# Patient Record
Sex: Male | Born: 1945
Health system: Southern US, Community
[De-identification: ages and names within clinical notes are randomized; demographics above are authoritative.]

## PROBLEM LIST (undated history)

## (undated) DIAGNOSIS — I10 Essential (primary) hypertension: Secondary | ICD-10-CM

## (undated) DIAGNOSIS — M109 Gout, unspecified: Secondary | ICD-10-CM

## (undated) DIAGNOSIS — L0390 Cellulitis, unspecified: Secondary | ICD-10-CM

## (undated) DIAGNOSIS — Z5189 Encounter for other specified aftercare: Secondary | ICD-10-CM

## (undated) DIAGNOSIS — F419 Anxiety disorder, unspecified: Secondary | ICD-10-CM

## (undated) DIAGNOSIS — C801 Malignant (primary) neoplasm, unspecified: Secondary | ICD-10-CM

## (undated) DIAGNOSIS — W3400XA Accidental discharge from unspecified firearms or gun, initial encounter: Secondary | ICD-10-CM

## (undated) DIAGNOSIS — D759 Disease of blood and blood-forming organs, unspecified: Secondary | ICD-10-CM

## (undated) DIAGNOSIS — E860 Dehydration: Secondary | ICD-10-CM

## (undated) DIAGNOSIS — I219 Acute myocardial infarction, unspecified: Secondary | ICD-10-CM

## (undated) DIAGNOSIS — M1990 Unspecified osteoarthritis, unspecified site: Secondary | ICD-10-CM

## (undated) DIAGNOSIS — K861 Other chronic pancreatitis: Secondary | ICD-10-CM

## (undated) DIAGNOSIS — J449 Chronic obstructive pulmonary disease, unspecified: Secondary | ICD-10-CM

## (undated) DIAGNOSIS — K859 Acute pancreatitis without necrosis or infection, unspecified: Secondary | ICD-10-CM

## (undated) DIAGNOSIS — J9600 Acute respiratory failure, unspecified whether with hypoxia or hypercapnia: Secondary | ICD-10-CM

## (undated) DIAGNOSIS — S71139A Puncture wound without foreign body, unspecified thigh, initial encounter: Secondary | ICD-10-CM

---

## 1966-05-09 HISTORY — PX: LEG AMPUTATION: SHX1105

## 1966-05-09 HISTORY — PX: OTHER SURGICAL HISTORY: SHX169

## 1998-01-23 ENCOUNTER — Emergency Department (HOSPITAL_COMMUNITY): Admission: EM | Admit: 1998-01-23 | Discharge: 1998-01-23 | Payer: Self-pay | Admitting: Emergency Medicine

## 1998-03-31 ENCOUNTER — Encounter: Payer: Self-pay | Admitting: Emergency Medicine

## 1998-03-31 ENCOUNTER — Inpatient Hospital Stay (HOSPITAL_COMMUNITY): Admission: EM | Admit: 1998-03-31 | Discharge: 1998-04-03 | Payer: Self-pay | Admitting: Emergency Medicine

## 1998-04-28 ENCOUNTER — Encounter (HOSPITAL_COMMUNITY): Admission: RE | Admit: 1998-04-28 | Discharge: 1998-07-27 | Payer: Self-pay | Admitting: *Deleted

## 1998-08-25 ENCOUNTER — Emergency Department (HOSPITAL_COMMUNITY): Admission: EM | Admit: 1998-08-25 | Discharge: 1998-08-25 | Payer: Self-pay | Admitting: Emergency Medicine

## 1998-08-25 ENCOUNTER — Encounter: Payer: Self-pay | Admitting: Emergency Medicine

## 1998-12-25 ENCOUNTER — Encounter: Payer: Self-pay | Admitting: *Deleted

## 1998-12-25 ENCOUNTER — Inpatient Hospital Stay (HOSPITAL_COMMUNITY): Admission: AD | Admit: 1998-12-25 | Discharge: 1998-12-26 | Payer: Self-pay | Admitting: *Deleted

## 1998-12-26 ENCOUNTER — Encounter: Payer: Self-pay | Admitting: *Deleted

## 1999-02-17 ENCOUNTER — Emergency Department (HOSPITAL_COMMUNITY): Admission: EM | Admit: 1999-02-17 | Discharge: 1999-02-17 | Payer: Self-pay | Admitting: Emergency Medicine

## 1999-03-17 ENCOUNTER — Emergency Department (HOSPITAL_COMMUNITY): Admission: EM | Admit: 1999-03-17 | Discharge: 1999-03-17 | Payer: Self-pay | Admitting: Internal Medicine

## 1999-03-17 ENCOUNTER — Encounter: Payer: Self-pay | Admitting: Internal Medicine

## 1999-08-02 ENCOUNTER — Encounter: Payer: Self-pay | Admitting: Emergency Medicine

## 1999-08-02 ENCOUNTER — Emergency Department (HOSPITAL_COMMUNITY): Admission: EM | Admit: 1999-08-02 | Discharge: 1999-08-02 | Payer: Self-pay | Admitting: Emergency Medicine

## 1999-08-22 ENCOUNTER — Emergency Department (HOSPITAL_COMMUNITY): Admission: EM | Admit: 1999-08-22 | Discharge: 1999-08-22 | Payer: Self-pay | Admitting: Emergency Medicine

## 1999-09-21 ENCOUNTER — Encounter
Admission: RE | Admit: 1999-09-21 | Discharge: 1999-12-20 | Payer: Self-pay | Admitting: Physical Medicine and Rehabilitation

## 1999-11-24 ENCOUNTER — Encounter: Payer: Self-pay | Admitting: Emergency Medicine

## 1999-11-24 ENCOUNTER — Emergency Department (HOSPITAL_COMMUNITY): Admission: EM | Admit: 1999-11-24 | Discharge: 1999-11-24 | Payer: Self-pay | Admitting: Emergency Medicine

## 2000-03-28 ENCOUNTER — Emergency Department (HOSPITAL_COMMUNITY): Admission: EM | Admit: 2000-03-28 | Discharge: 2000-03-28 | Payer: Self-pay | Admitting: Emergency Medicine

## 2000-11-29 ENCOUNTER — Ambulatory Visit (HOSPITAL_COMMUNITY): Admission: RE | Admit: 2000-11-29 | Discharge: 2000-11-29 | Payer: Self-pay | Admitting: Family Medicine

## 2000-11-29 ENCOUNTER — Encounter: Payer: Self-pay | Admitting: Family Medicine

## 2001-02-05 ENCOUNTER — Emergency Department (HOSPITAL_COMMUNITY): Admission: EM | Admit: 2001-02-05 | Discharge: 2001-02-05 | Payer: Self-pay | Admitting: Emergency Medicine

## 2001-02-06 ENCOUNTER — Encounter: Payer: Self-pay | Admitting: Emergency Medicine

## 2001-02-06 ENCOUNTER — Inpatient Hospital Stay (HOSPITAL_COMMUNITY): Admission: EM | Admit: 2001-02-06 | Discharge: 2001-02-07 | Payer: Self-pay | Admitting: Emergency Medicine

## 2001-02-07 ENCOUNTER — Encounter: Payer: Self-pay | Admitting: Internal Medicine

## 2001-03-19 ENCOUNTER — Encounter: Admission: RE | Admit: 2001-03-19 | Discharge: 2001-03-19 | Payer: Self-pay

## 2001-04-10 ENCOUNTER — Encounter: Admission: RE | Admit: 2001-04-10 | Discharge: 2001-04-10 | Payer: Self-pay | Admitting: Internal Medicine

## 2002-07-16 ENCOUNTER — Encounter: Payer: Self-pay | Admitting: Family Medicine

## 2002-07-16 ENCOUNTER — Ambulatory Visit (HOSPITAL_COMMUNITY): Admission: RE | Admit: 2002-07-16 | Discharge: 2002-07-16 | Payer: Self-pay | Admitting: Family Medicine

## 2003-06-03 ENCOUNTER — Emergency Department (HOSPITAL_COMMUNITY): Admission: EM | Admit: 2003-06-03 | Discharge: 2003-06-04 | Payer: Self-pay | Admitting: Emergency Medicine

## 2004-02-03 ENCOUNTER — Ambulatory Visit: Payer: Self-pay | Admitting: Family Medicine

## 2004-03-01 ENCOUNTER — Ambulatory Visit (HOSPITAL_COMMUNITY): Admission: RE | Admit: 2004-03-01 | Discharge: 2004-03-01 | Payer: Self-pay | Admitting: Surgery

## 2004-03-05 ENCOUNTER — Ambulatory Visit (HOSPITAL_COMMUNITY): Admission: RE | Admit: 2004-03-05 | Discharge: 2004-03-05 | Payer: Self-pay | Admitting: Surgery

## 2004-03-11 ENCOUNTER — Ambulatory Visit (HOSPITAL_COMMUNITY): Admission: RE | Admit: 2004-03-11 | Discharge: 2004-03-12 | Payer: Self-pay | Admitting: Surgery

## 2004-09-11 ENCOUNTER — Emergency Department (HOSPITAL_COMMUNITY): Admission: EM | Admit: 2004-09-11 | Discharge: 2004-09-11 | Payer: Self-pay | Admitting: Emergency Medicine

## 2004-10-26 ENCOUNTER — Emergency Department (HOSPITAL_COMMUNITY): Admission: EM | Admit: 2004-10-26 | Discharge: 2004-10-26 | Payer: Self-pay | Admitting: Emergency Medicine

## 2005-07-21 ENCOUNTER — Emergency Department (HOSPITAL_COMMUNITY): Admission: EM | Admit: 2005-07-21 | Discharge: 2005-07-21 | Payer: Self-pay | Admitting: Emergency Medicine

## 2005-12-29 ENCOUNTER — Emergency Department (HOSPITAL_COMMUNITY): Admission: EM | Admit: 2005-12-29 | Discharge: 2005-12-29 | Payer: Self-pay | Admitting: Emergency Medicine

## 2006-09-03 ENCOUNTER — Emergency Department (HOSPITAL_COMMUNITY): Admission: EM | Admit: 2006-09-03 | Discharge: 2006-09-03 | Payer: Self-pay | Admitting: Emergency Medicine

## 2006-09-04 ENCOUNTER — Emergency Department (HOSPITAL_COMMUNITY): Admission: EM | Admit: 2006-09-04 | Discharge: 2006-09-04 | Payer: Self-pay | Admitting: *Deleted

## 2006-09-07 ENCOUNTER — Inpatient Hospital Stay (HOSPITAL_COMMUNITY): Admission: AD | Admit: 2006-09-07 | Discharge: 2006-09-15 | Payer: Self-pay | Admitting: Oral and Maxillofacial Surgery

## 2006-09-11 ENCOUNTER — Ambulatory Visit: Payer: Self-pay | Admitting: Internal Medicine

## 2007-03-20 ENCOUNTER — Ambulatory Visit: Payer: Self-pay | Admitting: Vascular Surgery

## 2007-05-25 ENCOUNTER — Ambulatory Visit (HOSPITAL_BASED_OUTPATIENT_CLINIC_OR_DEPARTMENT_OTHER): Admission: RE | Admit: 2007-05-25 | Discharge: 2007-05-25 | Payer: Self-pay | Admitting: Orthopaedic Surgery

## 2007-05-31 ENCOUNTER — Ambulatory Visit: Payer: Self-pay | Admitting: Oncology

## 2007-06-01 ENCOUNTER — Inpatient Hospital Stay (HOSPITAL_COMMUNITY): Admission: EM | Admit: 2007-06-01 | Discharge: 2007-06-06 | Payer: Self-pay | Admitting: Emergency Medicine

## 2007-06-08 ENCOUNTER — Ambulatory Visit: Payer: Self-pay | Admitting: Oncology

## 2007-06-11 LAB — MANUAL DIFFERENTIAL
ALC: 6.6 10*3/uL — ABNORMAL HIGH (ref 0.9–3.3)
ANC (CHCC manual diff): 203.7 10*3/uL — ABNORMAL HIGH (ref 1.5–6.5)
Blasts: 1 % — ABNORMAL HIGH (ref 0–0)
LYMPH: 3 % — ABNORMAL LOW (ref 14–49)
MONO: 1 % (ref 0–14)
Metamyelocytes: 15 % — ABNORMAL HIGH (ref 0–0)
Myelocytes: 19 % — ABNORMAL HIGH (ref 0–0)
PLT EST: ADEQUATE
SEG: 40 % (ref 38–77)
Variant Lymph: 0 % (ref 0–0)
nRBC: 5 % — ABNORMAL HIGH (ref 0–0)

## 2007-06-11 LAB — COMPREHENSIVE METABOLIC PANEL
ALT: 29 U/L (ref 0–53)
AST: 27 U/L (ref 0–37)
Alkaline Phosphatase: 117 U/L (ref 39–117)
Calcium: 9.6 mg/dL (ref 8.4–10.5)
Chloride: 103 mEq/L (ref 96–112)
Creatinine, Ser: 1.14 mg/dL (ref 0.40–1.50)
Total Bilirubin: 0.5 mg/dL (ref 0.3–1.2)

## 2007-06-11 LAB — CBC WITH DIFFERENTIAL/PLATELET
HCT: 27.6 % — ABNORMAL LOW (ref 38.7–49.9)
MCHC: 29.7 g/dL — ABNORMAL LOW (ref 32.0–35.9)
Platelets: 330 10*3/uL (ref 145–400)
WBC: 219 10*3/uL (ref 4.0–10.0)

## 2007-06-15 LAB — BASIC METABOLIC PANEL
CO2: 26 mEq/L (ref 19–32)
Calcium: 9 mg/dL (ref 8.4–10.5)
Chloride: 107 mEq/L (ref 96–112)
Creatinine, Ser: 1.21 mg/dL (ref 0.40–1.50)
Glucose, Bld: 112 mg/dL — ABNORMAL HIGH (ref 70–99)

## 2007-06-15 LAB — CBC WITH DIFFERENTIAL/PLATELET
Basophils Absolute: 0 10*3/uL (ref 0.0–0.1)
EOS%: 0.8 % (ref 0.0–7.0)
HCT: 29.4 % — ABNORMAL LOW (ref 38.7–49.9)
HGB: 9.4 g/dL — ABNORMAL LOW (ref 13.0–17.1)
LYMPH%: 4 % — ABNORMAL LOW (ref 14.0–48.0)
MCH: 26.2 pg — ABNORMAL LOW (ref 28.0–33.4)
MCV: 82 fL (ref 81.6–98.0)
MONO%: 0.7 % (ref 0.0–13.0)
NEUT%: 94.5 % — ABNORMAL HIGH (ref 40.0–75.0)
Platelets: 276 10*3/uL (ref 145–400)

## 2007-06-21 LAB — CBC WITH DIFFERENTIAL/PLATELET
BASO%: 0.5 % (ref 0.0–2.0)
EOS%: 1.8 % (ref 0.0–7.0)
MCH: 26.7 pg — ABNORMAL LOW (ref 28.0–33.4)
MCHC: 32.8 g/dL (ref 32.0–35.9)
MCV: 81.4 fL — ABNORMAL LOW (ref 81.6–98.0)
MONO%: 1.6 % (ref 0.0–13.0)
NEUT%: 91.5 % — ABNORMAL HIGH (ref 40.0–75.0)
RDW: 18.8 % — ABNORMAL HIGH (ref 11.2–14.6)
lymph#: 2 10*3/uL (ref 0.9–3.3)

## 2007-07-03 LAB — CBC WITH DIFFERENTIAL/PLATELET
BASO%: 0.5 % (ref 0.0–2.0)
EOS%: 8.5 % — ABNORMAL HIGH (ref 0.0–7.0)
Eosinophils Absolute: 0.3 10*3/uL (ref 0.0–0.5)
LYMPH%: 10.7 % — ABNORMAL LOW (ref 14.0–48.0)
MCHC: 32.5 g/dL (ref 32.0–35.9)
MCV: 82.6 fL (ref 81.6–98.0)
MONO%: 6.9 % (ref 0.0–13.0)
NEUT#: 2.7 10*3/uL (ref 1.5–6.5)
Platelets: 136 10*3/uL — ABNORMAL LOW (ref 145–400)
RBC: 3.29 10*6/uL — ABNORMAL LOW (ref 4.20–5.71)
RDW: 18.9 % — ABNORMAL HIGH (ref 11.2–14.6)

## 2007-07-12 LAB — CBC WITH DIFFERENTIAL/PLATELET
BASO%: 0.7 % (ref 0.0–2.0)
EOS%: 9.1 % — ABNORMAL HIGH (ref 0.0–7.0)
HCT: 28.5 % — ABNORMAL LOW (ref 38.7–49.9)
MCH: 26.5 pg — ABNORMAL LOW (ref 28.0–33.4)
MCHC: 33.1 g/dL (ref 32.0–35.9)
MONO#: 0.1 10*3/uL (ref 0.1–0.9)
RBC: 3.55 10*6/uL — ABNORMAL LOW (ref 4.20–5.71)
RDW: 18.1 % — ABNORMAL HIGH (ref 11.2–14.6)
WBC: 2.1 10*3/uL — ABNORMAL LOW (ref 4.0–10.0)
lymph#: 0.5 10*3/uL — ABNORMAL LOW (ref 0.9–3.3)

## 2007-07-12 LAB — FERRITIN: Ferritin: 93 ng/mL (ref 22–322)

## 2007-07-17 ENCOUNTER — Ambulatory Visit: Payer: Self-pay | Admitting: Oncology

## 2007-07-19 LAB — CBC WITH DIFFERENTIAL/PLATELET
BASO%: 1.7 % (ref 0.0–2.0)
Eosinophils Absolute: 0.2 10*3/uL (ref 0.0–0.5)
LYMPH%: 22.2 % (ref 14.0–48.0)
MCHC: 33 g/dL (ref 32.0–35.9)
MONO#: 0.2 10*3/uL (ref 0.1–0.9)
NEUT#: 1.3 10*3/uL — ABNORMAL LOW (ref 1.5–6.5)
Platelets: 192 10*3/uL (ref 145–400)
RBC: 3.82 10*6/uL — ABNORMAL LOW (ref 4.20–5.71)
RDW: 17.8 % — ABNORMAL HIGH (ref 11.2–14.6)
WBC: 2.2 10*3/uL — ABNORMAL LOW (ref 4.0–10.0)
lymph#: 0.5 10*3/uL — ABNORMAL LOW (ref 0.9–3.3)

## 2007-07-20 LAB — BCR/ABL

## 2007-07-26 LAB — CBC WITH DIFFERENTIAL/PLATELET
BASO%: 0 % (ref 0.0–2.0)
Eosinophils Absolute: 0 10*3/uL (ref 0.0–0.5)
HCT: 30.9 % — ABNORMAL LOW (ref 38.7–49.9)
MCHC: 32.8 g/dL (ref 32.0–35.9)
MONO#: 0.2 10*3/uL (ref 0.1–0.9)
NEUT#: 2.9 10*3/uL (ref 1.5–6.5)
NEUT%: 83.1 % — ABNORMAL HIGH (ref 40.0–75.0)
WBC: 3.4 10*3/uL — ABNORMAL LOW (ref 4.0–10.0)
lymph#: 0.4 10*3/uL — ABNORMAL LOW (ref 0.9–3.3)

## 2007-08-02 LAB — CBC WITH DIFFERENTIAL/PLATELET
Basophils Absolute: 0 10*3/uL (ref 0.0–0.1)
HCT: 33.7 % — ABNORMAL LOW (ref 38.7–49.9)
HGB: 10.6 g/dL — ABNORMAL LOW (ref 13.0–17.1)
LYMPH%: 26.6 % (ref 14.0–48.0)
MCH: 24.8 pg — ABNORMAL LOW (ref 28.0–33.4)
MONO#: 0.2 10*3/uL (ref 0.1–0.9)
NEUT%: 53.9 % (ref 40.0–75.0)
Platelets: 178 10*3/uL (ref 145–400)
WBC: 3 10*3/uL — ABNORMAL LOW (ref 4.0–10.0)
lymph#: 0.8 10*3/uL — ABNORMAL LOW (ref 0.9–3.3)

## 2007-08-16 LAB — CBC WITH DIFFERENTIAL/PLATELET
Basophils Absolute: 0 10*3/uL (ref 0.0–0.1)
EOS%: 9.5 % — ABNORMAL HIGH (ref 0.0–7.0)
HCT: 35.1 % — ABNORMAL LOW (ref 38.7–49.9)
HGB: 11.1 g/dL — ABNORMAL LOW (ref 13.0–17.1)
LYMPH%: 24.3 % (ref 14.0–48.0)
MCH: 24.6 pg — ABNORMAL LOW (ref 28.0–33.4)
MCV: 77.5 fL — ABNORMAL LOW (ref 81.6–98.0)
MONO%: 7.3 % (ref 0.0–13.0)
NEUT%: 58.5 % (ref 40.0–75.0)

## 2007-08-28 ENCOUNTER — Ambulatory Visit: Payer: Self-pay | Admitting: Oncology

## 2007-08-30 LAB — CBC WITH DIFFERENTIAL/PLATELET
Basophils Absolute: 0 10*3/uL (ref 0.0–0.1)
EOS%: 10.4 % — ABNORMAL HIGH (ref 0.0–7.0)
HGB: 11.4 g/dL — ABNORMAL LOW (ref 13.0–17.1)
MCH: 24.9 pg — ABNORMAL LOW (ref 28.0–33.4)
MCHC: 33.2 g/dL (ref 32.0–35.9)
MCV: 75 fL — ABNORMAL LOW (ref 81.6–98.0)
MONO%: 8.6 % (ref 0.0–13.0)
RBC: 4.57 10*6/uL (ref 4.20–5.71)
RDW: 17.6 % — ABNORMAL HIGH (ref 11.2–14.6)

## 2007-10-10 LAB — CBC WITH DIFFERENTIAL/PLATELET
EOS%: 6 % (ref 0.0–7.0)
Eosinophils Absolute: 0.2 10*3/uL (ref 0.0–0.5)
MCV: 74.3 fL — ABNORMAL LOW (ref 81.6–98.0)
MONO%: 7.2 % (ref 0.0–13.0)
NEUT#: 1.8 10*3/uL (ref 1.5–6.5)
RBC: 4.47 10*6/uL (ref 4.20–5.71)
RDW: 20.7 % — ABNORMAL HIGH (ref 11.2–14.6)

## 2007-10-10 LAB — COMPREHENSIVE METABOLIC PANEL
ALT: 8 U/L (ref 0–53)
AST: 14 U/L (ref 0–37)
Albumin: 4.2 g/dL (ref 3.5–5.2)
Alkaline Phosphatase: 93 U/L (ref 39–117)
Glucose, Bld: 99 mg/dL (ref 70–99)
Potassium: 2.9 mEq/L — ABNORMAL LOW (ref 3.5–5.3)
Sodium: 143 mEq/L (ref 135–145)
Total Protein: 6.8 g/dL (ref 6.0–8.3)

## 2007-10-15 ENCOUNTER — Ambulatory Visit: Payer: Self-pay | Admitting: Oncology

## 2007-10-15 LAB — BASIC METABOLIC PANEL
BUN: 16 mg/dL (ref 6–23)
CO2: 24 mEq/L (ref 19–32)
Calcium: 9.5 mg/dL (ref 8.4–10.5)
Creatinine, Ser: 1.36 mg/dL (ref 0.40–1.50)
Glucose, Bld: 115 mg/dL — ABNORMAL HIGH (ref 70–99)

## 2007-10-24 LAB — CBC WITH DIFFERENTIAL/PLATELET
BASO%: 0.4 % (ref 0.0–2.0)
EOS%: 8.6 % — ABNORMAL HIGH (ref 0.0–7.0)
MCH: 24.7 pg — ABNORMAL LOW (ref 28.0–33.4)
MCHC: 33.3 g/dL (ref 32.0–35.9)
MONO%: 7.6 % (ref 0.0–13.0)
RDW: 22.5 % — ABNORMAL HIGH (ref 11.2–14.6)
lymph#: 0.7 10*3/uL — ABNORMAL LOW (ref 0.9–3.3)

## 2007-10-24 LAB — BASIC METABOLIC PANEL
Chloride: 109 mEq/L (ref 96–112)
Creatinine, Ser: 1.1 mg/dL (ref 0.40–1.50)
Potassium: 3.4 mEq/L — ABNORMAL LOW (ref 3.5–5.3)

## 2007-11-20 LAB — CBC WITH DIFFERENTIAL/PLATELET
Basophils Absolute: 0 10*3/uL (ref 0.0–0.1)
Eosinophils Absolute: 0.3 10*3/uL (ref 0.0–0.5)
HGB: 10 g/dL — ABNORMAL LOW (ref 13.0–17.1)
MCV: 77.9 fL — ABNORMAL LOW (ref 81.6–98.0)
MONO#: 0.4 10*3/uL (ref 0.1–0.9)
MONO%: 6.6 % (ref 0.0–13.0)
NEUT#: 4.7 10*3/uL (ref 1.5–6.5)
RDW: 21.9 % — ABNORMAL HIGH (ref 11.2–14.6)
WBC: 6.6 10*3/uL (ref 4.0–10.0)

## 2007-12-11 ENCOUNTER — Inpatient Hospital Stay (HOSPITAL_COMMUNITY): Admission: EM | Admit: 2007-12-11 | Discharge: 2007-12-13 | Payer: Self-pay | Admitting: Emergency Medicine

## 2007-12-18 ENCOUNTER — Ambulatory Visit: Payer: Self-pay | Admitting: Oncology

## 2007-12-20 LAB — CBC WITH DIFFERENTIAL/PLATELET
BASO%: 0.4 % (ref 0.0–2.0)
HCT: 30.8 % — ABNORMAL LOW (ref 38.7–49.9)
LYMPH%: 27.6 % (ref 14.0–48.0)
MCH: 27.4 pg — ABNORMAL LOW (ref 28.0–33.4)
MCHC: 33.4 g/dL (ref 32.0–35.9)
MCV: 82.3 fL (ref 81.6–98.0)
MONO#: 0.2 10*3/uL (ref 0.1–0.9)
MONO%: 7 % (ref 0.0–13.0)
NEUT%: 56.7 % (ref 40.0–75.0)
Platelets: 187 10*3/uL (ref 145–400)
WBC: 3.2 10*3/uL — ABNORMAL LOW (ref 4.0–10.0)

## 2007-12-20 LAB — COMPREHENSIVE METABOLIC PANEL
ALT: 15 U/L (ref 0–53)
Alkaline Phosphatase: 62 U/L (ref 39–117)
CO2: 19 mEq/L (ref 19–32)
Creatinine, Ser: 1.2 mg/dL (ref 0.40–1.50)
Total Bilirubin: 0.5 mg/dL (ref 0.3–1.2)

## 2008-01-23 LAB — CBC WITH DIFFERENTIAL/PLATELET
Basophils Absolute: 0 10*3/uL (ref 0.0–0.1)
HCT: 31.2 % — ABNORMAL LOW (ref 38.7–49.9)
HGB: 10.6 g/dL — ABNORMAL LOW (ref 13.0–17.1)
MCH: 28.1 pg (ref 28.0–33.4)
MONO#: 0.3 10*3/uL (ref 0.1–0.9)
NEUT%: 58.6 % (ref 40.0–75.0)
WBC: 4.3 10*3/uL (ref 4.0–10.0)
lymph#: 1.1 10*3/uL (ref 0.9–3.3)

## 2008-01-23 LAB — COMPREHENSIVE METABOLIC PANEL
BUN: 19 mg/dL (ref 6–23)
CO2: 20 mEq/L (ref 19–32)
Calcium: 9.5 mg/dL (ref 8.4–10.5)
Chloride: 107 mEq/L (ref 96–112)
Creatinine, Ser: 1.25 mg/dL (ref 0.40–1.50)
Glucose, Bld: 115 mg/dL — ABNORMAL HIGH (ref 70–99)

## 2008-01-24 LAB — TESTOSTERONE: Testosterone: 79.82 ng/dL — ABNORMAL LOW (ref 350–890)

## 2008-01-28 ENCOUNTER — Ambulatory Visit: Payer: Self-pay | Admitting: Oncology

## 2008-01-30 LAB — BASIC METABOLIC PANEL
CO2: 17 mEq/L — ABNORMAL LOW (ref 19–32)
Calcium: 9.3 mg/dL (ref 8.4–10.5)
Chloride: 111 mEq/L (ref 96–112)
Potassium: 3.2 mEq/L — ABNORMAL LOW (ref 3.5–5.3)
Sodium: 143 mEq/L (ref 135–145)

## 2008-01-31 LAB — BCR/ABL

## 2008-02-07 LAB — BASIC METABOLIC PANEL
BUN: 11 mg/dL (ref 6–23)
CO2: 22 mEq/L (ref 19–32)
Chloride: 109 mEq/L (ref 96–112)
Creatinine, Ser: 1.11 mg/dL (ref 0.40–1.50)
Glucose, Bld: 111 mg/dL — ABNORMAL HIGH (ref 70–99)

## 2008-02-25 LAB — CBC WITH DIFFERENTIAL/PLATELET
Basophils Absolute: 0 10*3/uL (ref 0.0–0.1)
EOS%: 8.8 % — ABNORMAL HIGH (ref 0.0–7.0)
HGB: 10.2 g/dL — ABNORMAL LOW (ref 13.0–17.1)
MCH: 27.9 pg — ABNORMAL LOW (ref 28.0–33.4)
MCV: 82.9 fL (ref 81.6–98.0)
MONO%: 6.6 % (ref 0.0–13.0)
RDW: 15.7 % — ABNORMAL HIGH (ref 11.2–14.6)

## 2008-03-18 ENCOUNTER — Ambulatory Visit: Payer: Self-pay | Admitting: Oncology

## 2008-03-20 LAB — COMPREHENSIVE METABOLIC PANEL
AST: 16 U/L (ref 0–37)
Albumin: 4.3 g/dL (ref 3.5–5.2)
Alkaline Phosphatase: 56 U/L (ref 39–117)
Potassium: 2.5 mEq/L — CL (ref 3.5–5.3)
Sodium: 142 mEq/L (ref 135–145)
Total Protein: 6.6 g/dL (ref 6.0–8.3)

## 2008-03-20 LAB — CBC WITH DIFFERENTIAL/PLATELET
EOS%: 6.8 % (ref 0.0–7.0)
MCH: 27.8 pg — ABNORMAL LOW (ref 28.0–33.4)
MCV: 82.6 fL (ref 81.6–98.0)
MONO%: 7.1 % (ref 0.0–13.0)
NEUT#: 3.2 10*3/uL (ref 1.5–6.5)
RBC: 3.93 10*6/uL — ABNORMAL LOW (ref 4.20–5.71)
RDW: 15.8 % — ABNORMAL HIGH (ref 11.2–14.6)

## 2008-03-25 LAB — BASIC METABOLIC PANEL
CO2: 23 mEq/L (ref 19–32)
Creatinine, Ser: 0.95 mg/dL (ref 0.40–1.50)

## 2008-04-01 LAB — BASIC METABOLIC PANEL
BUN: 13 mg/dL (ref 6–23)
CO2: 23 mEq/L (ref 19–32)
Chloride: 112 mEq/L (ref 96–112)
Creatinine, Ser: 1.45 mg/dL (ref 0.40–1.50)
Potassium: 3.8 mEq/L (ref 3.5–5.3)

## 2008-04-17 LAB — CBC WITH DIFFERENTIAL/PLATELET
Basophils Absolute: 0 10*3/uL (ref 0.0–0.1)
EOS%: 3.2 % (ref 0.0–7.0)
Eosinophils Absolute: 0.2 10*3/uL (ref 0.0–0.5)
LYMPH%: 22.1 % (ref 14.0–48.0)
MCH: 27.8 pg — ABNORMAL LOW (ref 28.0–33.4)
MCV: 83.4 fL (ref 81.6–98.0)
MONO%: 5 % (ref 0.0–13.0)
NEUT#: 4 10*3/uL (ref 1.5–6.5)
Platelets: 169 10*3/uL (ref 145–400)
RBC: 3.78 10*6/uL — ABNORMAL LOW (ref 4.20–5.71)

## 2008-05-13 ENCOUNTER — Ambulatory Visit: Payer: Self-pay | Admitting: Oncology

## 2008-05-15 LAB — CBC WITH DIFFERENTIAL/PLATELET
BASO%: 0.7 % (ref 0.0–2.0)
LYMPH%: 30.3 % (ref 14.0–48.0)
MCHC: 33.5 g/dL (ref 32.0–35.9)
MCV: 83.3 fL (ref 81.6–98.0)
MONO%: 6.5 % (ref 0.0–13.0)
Platelets: 177 10*3/uL (ref 145–400)
RBC: 3.92 10*6/uL — ABNORMAL LOW (ref 4.20–5.71)
RDW: 15.7 % — ABNORMAL HIGH (ref 11.2–14.6)
WBC: 3.4 10*3/uL — ABNORMAL LOW (ref 4.0–10.0)

## 2008-05-21 LAB — BASIC METABOLIC PANEL
Calcium: 9.2 mg/dL (ref 8.4–10.5)
Sodium: 144 mEq/L (ref 135–145)

## 2008-06-12 LAB — CBC WITH DIFFERENTIAL/PLATELET
BASO%: 0.4 % (ref 0.0–2.0)
Basophils Absolute: 0 10*3/uL (ref 0.0–0.1)
Eosinophils Absolute: 0.3 10*3/uL (ref 0.0–0.5)
HCT: 34 % — ABNORMAL LOW (ref 38.7–49.9)
HGB: 11.4 g/dL — ABNORMAL LOW (ref 13.0–17.1)
MCHC: 33.6 g/dL (ref 32.0–35.9)
MONO#: 0.3 10*3/uL (ref 0.1–0.9)
NEUT#: 2.2 10*3/uL (ref 1.5–6.5)
NEUT%: 60.1 % (ref 40.0–75.0)
WBC: 3.7 10*3/uL — ABNORMAL LOW (ref 4.0–10.0)
lymph#: 0.9 10*3/uL (ref 0.9–3.3)

## 2008-06-12 LAB — BASIC METABOLIC PANEL
CO2: 24 mEq/L (ref 19–32)
Chloride: 109 mEq/L (ref 96–112)
Creatinine, Ser: 1.23 mg/dL (ref 0.40–1.50)

## 2008-07-08 ENCOUNTER — Ambulatory Visit: Payer: Self-pay | Admitting: Oncology

## 2008-07-10 LAB — CBC WITH DIFFERENTIAL/PLATELET
Basophils Absolute: 0 10*3/uL (ref 0.0–0.1)
EOS%: 7.6 % — ABNORMAL HIGH (ref 0.0–7.0)
HGB: 12.2 g/dL — ABNORMAL LOW (ref 13.0–17.1)
MCH: 27.3 pg (ref 27.2–33.4)
MCV: 82.4 fL (ref 79.3–98.0)
MONO%: 5.9 % (ref 0.0–14.0)
NEUT%: 63.9 % (ref 39.0–75.0)
RDW: 15.2 % — ABNORMAL HIGH (ref 11.0–14.6)

## 2008-08-25 ENCOUNTER — Ambulatory Visit: Payer: Self-pay | Admitting: Oncology

## 2008-08-25 LAB — CBC WITH DIFFERENTIAL/PLATELET
BASO%: 0.5 % (ref 0.0–2.0)
EOS%: 6.1 % (ref 0.0–7.0)
MCH: 26.8 pg — ABNORMAL LOW (ref 27.2–33.4)
MCHC: 33.1 g/dL (ref 32.0–36.0)
MCV: 80.8 fL (ref 79.3–98.0)
MONO%: 6.2 % (ref 0.0–14.0)
NEUT#: 4.6 10*3/uL (ref 1.5–6.5)
RBC: 4.44 10*6/uL (ref 4.20–5.82)
RDW: 15.8 % — ABNORMAL HIGH (ref 11.0–14.6)

## 2008-08-25 LAB — COMPREHENSIVE METABOLIC PANEL
ALT: <8 U/L (ref 0–53)
Alkaline Phosphatase: 61 U/L (ref 39–117)
Sodium: 142 mEq/L (ref 135–145)
Total Bilirubin: 0.4 mg/dL (ref 0.3–1.2)
Total Protein: 6.6 g/dL (ref 6.0–8.3)

## 2008-09-02 LAB — BCR/ABL (CHCC SATELLITE)

## 2008-09-19 ENCOUNTER — Emergency Department (HOSPITAL_COMMUNITY): Admission: EM | Admit: 2008-09-19 | Discharge: 2008-09-19 | Payer: Self-pay | Admitting: *Deleted

## 2008-10-07 ENCOUNTER — Ambulatory Visit: Payer: Self-pay | Admitting: Oncology

## 2008-10-07 LAB — BASIC METABOLIC PANEL
BUN: 24 mg/dL — ABNORMAL HIGH (ref 6–23)
CO2: 18 mEq/L — ABNORMAL LOW (ref 19–32)
Calcium: 9.5 mg/dL (ref 8.4–10.5)
Glucose, Bld: 121 mg/dL — ABNORMAL HIGH (ref 70–99)
Potassium: 3.3 mEq/L — ABNORMAL LOW (ref 3.5–5.3)

## 2008-10-17 LAB — BASIC METABOLIC PANEL
Calcium: 9.5 mg/dL (ref 8.4–10.5)
Creatinine, Ser: 1.23 mg/dL (ref 0.40–1.50)
Glucose, Bld: 111 mg/dL — ABNORMAL HIGH (ref 70–99)
Sodium: 140 mEq/L (ref 135–145)

## 2008-11-05 LAB — CBC WITH DIFFERENTIAL/PLATELET
BASO%: 0.3 % (ref 0.0–2.0)
HCT: 32.7 % — ABNORMAL LOW (ref 38.4–49.9)
MCHC: 33.6 g/dL (ref 32.0–36.0)
MONO#: 0.2 10*3/uL (ref 0.1–0.9)
NEUT%: 46.7 % (ref 39.0–75.0)
RDW: 15.6 % — ABNORMAL HIGH (ref 11.0–14.6)
WBC: 4 10*3/uL (ref 4.0–10.3)
lymph#: 1.5 10*3/uL (ref 0.9–3.3)
nRBC: 0 % (ref 0–0)

## 2008-11-05 LAB — COMPREHENSIVE METABOLIC PANEL
ALT: 12 U/L (ref 0–53)
AST: 15 U/L (ref 0–37)
Albumin: 3.9 g/dL (ref 3.5–5.2)
Alkaline Phosphatase: 46 U/L (ref 39–117)
BUN: 17 mg/dL (ref 6–23)
Calcium: 9.4 mg/dL (ref 8.4–10.5)
Chloride: 109 mEq/L (ref 96–112)
Potassium: 3.6 mEq/L (ref 3.5–5.3)
Sodium: 140 mEq/L (ref 135–145)
Total Protein: 6 g/dL (ref 6.0–8.3)

## 2008-12-01 ENCOUNTER — Ambulatory Visit: Payer: Self-pay | Admitting: Oncology

## 2008-12-04 LAB — BASIC METABOLIC PANEL
CO2: 20 mEq/L (ref 19–32)
Chloride: 109 mEq/L (ref 96–112)
Creatinine, Ser: 1.06 mg/dL (ref 0.40–1.50)
Potassium: 3.7 mEq/L (ref 3.5–5.3)

## 2008-12-04 LAB — CBC WITH DIFFERENTIAL/PLATELET
BASO%: 0.6 % (ref 0.0–2.0)
Basophils Absolute: 0 10*3/uL (ref 0.0–0.1)
EOS%: 8.5 % — ABNORMAL HIGH (ref 0.0–7.0)
HCT: 32.8 % — ABNORMAL LOW (ref 38.4–49.9)
HGB: 10.8 g/dL — ABNORMAL LOW (ref 13.0–17.1)
MCH: 27.6 pg (ref 27.2–33.4)
MONO#: 0.3 10*3/uL (ref 0.1–0.9)
NEUT%: 53.8 % (ref 39.0–75.0)
RDW: 16.3 % — ABNORMAL HIGH (ref 11.0–14.6)
WBC: 3.9 10*3/uL — ABNORMAL LOW (ref 4.0–10.3)
lymph#: 1.2 10*3/uL (ref 0.9–3.3)

## 2009-01-13 ENCOUNTER — Ambulatory Visit: Payer: Self-pay | Admitting: Oncology

## 2009-02-02 LAB — CBC WITH DIFFERENTIAL/PLATELET
Basophils Absolute: 0 10*3/uL (ref 0.0–0.1)
EOS%: 6.7 % (ref 0.0–7.0)
Eosinophils Absolute: 0.3 10*3/uL (ref 0.0–0.5)
HGB: 11.3 g/dL — ABNORMAL LOW (ref 13.0–17.1)
LYMPH%: 26.8 % (ref 14.0–49.0)
MCH: 28 pg (ref 27.2–33.4)
MCV: 83 fL (ref 79.3–98.0)
MONO%: 7.9 % (ref 0.0–14.0)
NEUT#: 2.8 10*3/uL (ref 1.5–6.5)
NEUT%: 58.1 % (ref 39.0–75.0)
Platelets: 166 10*3/uL (ref 140–400)

## 2009-02-02 LAB — COMPREHENSIVE METABOLIC PANEL
AST: 15 U/L (ref 0–37)
Albumin: 4.7 g/dL (ref 3.5–5.2)
Alkaline Phosphatase: 49 U/L (ref 39–117)
BUN: 16 mg/dL (ref 6–23)
Creatinine, Ser: 1.2 mg/dL (ref 0.40–1.50)
Glucose, Bld: 113 mg/dL — ABNORMAL HIGH (ref 70–99)
Total Bilirubin: 0.6 mg/dL (ref 0.3–1.2)

## 2009-02-26 ENCOUNTER — Ambulatory Visit: Payer: Self-pay | Admitting: Oncology

## 2009-03-02 LAB — COMPREHENSIVE METABOLIC PANEL
ALT: 9 U/L (ref 0–53)
AST: 16 U/L (ref 0–37)
Alkaline Phosphatase: 59 U/L (ref 39–117)
CO2: 22 mEq/L (ref 19–32)
Creatinine, Ser: 1.34 mg/dL (ref 0.40–1.50)
Sodium: 140 mEq/L (ref 135–145)
Total Bilirubin: 0.4 mg/dL (ref 0.3–1.2)
Total Protein: 6.8 g/dL (ref 6.0–8.3)

## 2009-03-02 LAB — CBC WITH DIFFERENTIAL/PLATELET
Basophils Absolute: 0 10*3/uL (ref 0.0–0.1)
Eosinophils Absolute: 0.5 10*3/uL (ref 0.0–0.5)
HCT: 35.8 % — ABNORMAL LOW (ref 38.4–49.9)
HGB: 11.9 g/dL — ABNORMAL LOW (ref 13.0–17.1)
MCV: 84.6 fL (ref 79.3–98.0)
MONO%: 6.9 % (ref 0.0–14.0)
NEUT#: 3.1 10*3/uL (ref 1.5–6.5)
NEUT%: 59.5 % (ref 39.0–75.0)
Platelets: 172 10*3/uL (ref 140–400)
RDW: 14 % (ref 11.0–14.6)

## 2009-03-11 LAB — BCR/ABL

## 2009-03-16 ENCOUNTER — Ambulatory Visit: Payer: Self-pay | Admitting: Psychiatry

## 2009-03-26 ENCOUNTER — Ambulatory Visit: Payer: Self-pay | Admitting: Psychiatry

## 2009-04-07 ENCOUNTER — Emergency Department (HOSPITAL_COMMUNITY): Admission: EM | Admit: 2009-04-07 | Discharge: 2009-04-07 | Payer: Self-pay | Admitting: Emergency Medicine

## 2009-04-08 ENCOUNTER — Ambulatory Visit: Payer: Self-pay | Admitting: Psychiatry

## 2009-04-09 ENCOUNTER — Ambulatory Visit: Payer: Self-pay | Admitting: Oncology

## 2009-04-13 LAB — CBC WITH DIFFERENTIAL/PLATELET
BASO%: 0.5 % (ref 0.0–2.0)
Basophils Absolute: 0 10*3/uL (ref 0.0–0.1)
EOS%: 8 % — ABNORMAL HIGH (ref 0.0–7.0)
HCT: 31.9 % — ABNORMAL LOW (ref 38.4–49.9)
HGB: 10.6 g/dL — ABNORMAL LOW (ref 13.0–17.1)
LYMPH%: 31.1 % (ref 14.0–49.0)
MCH: 27.7 pg (ref 27.2–33.4)
MCHC: 33.3 g/dL (ref 32.0–36.0)
MCV: 83.3 fL (ref 79.3–98.0)
MONO%: 6.8 % (ref 0.0–14.0)
NEUT%: 53.6 % (ref 39.0–75.0)
Platelets: 163 10*3/uL (ref 140–400)
lymph#: 1.6 10*3/uL (ref 0.9–3.3)

## 2009-04-13 LAB — BASIC METABOLIC PANEL
BUN: 16 mg/dL (ref 6–23)
Calcium: 8.9 mg/dL (ref 8.4–10.5)
Chloride: 108 mEq/L (ref 96–112)
Creatinine, Ser: 1.31 mg/dL (ref 0.40–1.50)

## 2009-05-11 ENCOUNTER — Ambulatory Visit: Payer: Self-pay | Admitting: Oncology

## 2009-05-19 LAB — CBC WITH DIFFERENTIAL/PLATELET
EOS%: 9.9 % — ABNORMAL HIGH (ref 0.0–7.0)
Eosinophils Absolute: 0.4 10*3/uL (ref 0.0–0.5)
HGB: 11.4 g/dL — ABNORMAL LOW (ref 13.0–17.1)
MCH: 27.3 pg (ref 27.2–33.4)
MCV: 83.6 fL (ref 79.3–98.0)
MONO%: 8.5 % (ref 0.0–14.0)
NEUT#: 2.1 10*3/uL (ref 1.5–6.5)
RBC: 4.18 10*6/uL — ABNORMAL LOW (ref 4.20–5.82)
RDW: 15.4 % — ABNORMAL HIGH (ref 11.0–14.6)
lymph#: 1.3 10*3/uL (ref 0.9–3.3)

## 2009-05-19 LAB — COMPREHENSIVE METABOLIC PANEL
AST: 11 U/L (ref 0–37)
Albumin: 4 g/dL (ref 3.5–5.2)
Alkaline Phosphatase: 46 U/L (ref 39–117)
Calcium: 9 mg/dL (ref 8.4–10.5)
Chloride: 106 mEq/L (ref 96–112)
Potassium: 3 mEq/L — ABNORMAL LOW (ref 3.5–5.3)
Sodium: 143 mEq/L (ref 135–145)
Total Protein: 6.1 g/dL (ref 6.0–8.3)

## 2009-05-28 ENCOUNTER — Ambulatory Visit: Payer: Self-pay | Admitting: Psychiatry

## 2009-06-26 ENCOUNTER — Ambulatory Visit: Payer: Self-pay | Admitting: Oncology

## 2009-06-30 LAB — BASIC METABOLIC PANEL
BUN: 11 mg/dL (ref 6–23)
Chloride: 104 mEq/L (ref 96–112)

## 2009-06-30 LAB — CBC WITH DIFFERENTIAL/PLATELET
Basophils Absolute: 0 10*3/uL (ref 0.0–0.1)
Eosinophils Absolute: 0.4 10*3/uL (ref 0.0–0.5)
HCT: 34.7 % — ABNORMAL LOW (ref 38.4–49.9)
HGB: 11.2 g/dL — ABNORMAL LOW (ref 13.0–17.1)
LYMPH%: 27 % (ref 14.0–49.0)
MCV: 82.8 fL (ref 79.3–98.0)
MONO%: 5.5 % (ref 0.0–14.0)
NEUT#: 2.8 10*3/uL (ref 1.5–6.5)
NEUT%: 59.7 % (ref 39.0–75.0)
Platelets: 146 10*3/uL (ref 140–400)

## 2009-07-24 ENCOUNTER — Ambulatory Visit (HOSPITAL_COMMUNITY): Admission: RE | Admit: 2009-07-24 | Discharge: 2009-07-24 | Payer: Self-pay | Admitting: Gastroenterology

## 2009-08-07 ENCOUNTER — Ambulatory Visit: Payer: Self-pay | Admitting: Oncology

## 2009-08-11 LAB — COMPREHENSIVE METABOLIC PANEL
ALT: 12 U/L (ref 0–53)
CO2: 23 mEq/L (ref 19–32)
Calcium: 8.9 mg/dL (ref 8.4–10.5)
Chloride: 107 mEq/L (ref 96–112)
Creatinine, Ser: 1.16 mg/dL (ref 0.40–1.50)
Glucose, Bld: 120 mg/dL — ABNORMAL HIGH (ref 70–99)
Total Protein: 6.2 g/dL (ref 6.0–8.3)

## 2009-08-11 LAB — CBC WITH DIFFERENTIAL/PLATELET
Basophils Absolute: 0 10*3/uL (ref 0.0–0.1)
Eosinophils Absolute: 0.4 10*3/uL (ref 0.0–0.5)
HCT: 34.6 % — ABNORMAL LOW (ref 38.4–49.9)
HGB: 11.4 g/dL — ABNORMAL LOW (ref 13.0–17.1)
LYMPH%: 32.7 % (ref 14.0–49.0)
MONO#: 0.3 10*3/uL (ref 0.1–0.9)
NEUT#: 1.8 10*3/uL (ref 1.5–6.5)
Platelets: 154 10*3/uL (ref 140–400)
RBC: 4.24 10*6/uL (ref 4.20–5.82)
WBC: 3.8 10*3/uL — ABNORMAL LOW (ref 4.0–10.3)
nRBC: 0 % (ref 0–0)

## 2009-08-17 LAB — BCR/ABL (LIO MMD)

## 2009-09-21 ENCOUNTER — Ambulatory Visit: Payer: Self-pay | Admitting: Oncology

## 2009-10-07 LAB — CBC WITH DIFFERENTIAL/PLATELET
BASO%: 0.5 % (ref 0.0–2.0)
LYMPH%: 33.4 % (ref 14.0–49.0)
MCHC: 32.2 g/dL (ref 32.0–36.0)
MONO#: 0.3 10*3/uL (ref 0.1–0.9)
RBC: 4.21 10*6/uL (ref 4.20–5.82)
RDW: 15.7 % — ABNORMAL HIGH (ref 11.0–14.6)
WBC: 3.8 10*3/uL — ABNORMAL LOW (ref 4.0–10.3)
lymph#: 1.3 10*3/uL (ref 0.9–3.3)

## 2009-10-07 LAB — BASIC METABOLIC PANEL
CO2: 25 mEq/L (ref 19–32)
Calcium: 9.1 mg/dL (ref 8.4–10.5)
Chloride: 106 mEq/L (ref 96–112)
Sodium: 142 mEq/L (ref 135–145)

## 2009-11-03 ENCOUNTER — Ambulatory Visit: Payer: Self-pay | Admitting: Oncology

## 2009-11-03 LAB — CBC WITH DIFFERENTIAL/PLATELET
Basophils Absolute: 0 10*3/uL (ref 0.0–0.1)
EOS%: 9.3 % — ABNORMAL HIGH (ref 0.0–7.0)
HCT: 35.8 % — ABNORMAL LOW (ref 38.4–49.9)
HGB: 11.8 g/dL — ABNORMAL LOW (ref 13.0–17.1)
MCH: 27.7 pg (ref 27.2–33.4)
NEUT%: 64.5 % (ref 39.0–75.0)
lymph#: 1 10*3/uL (ref 0.9–3.3)

## 2009-11-03 LAB — BASIC METABOLIC PANEL
BUN: 25 mg/dL — ABNORMAL HIGH (ref 6–23)
CO2: 28 mEq/L (ref 19–32)
Chloride: 100 mEq/L (ref 96–112)
Glucose, Bld: 135 mg/dL — ABNORMAL HIGH (ref 70–99)
Potassium: 3.7 mEq/L (ref 3.5–5.3)

## 2009-11-24 LAB — CBC WITH DIFFERENTIAL/PLATELET
BASO%: 0.4 % (ref 0.0–2.0)
LYMPH%: 27.2 % (ref 14.0–49.0)
MCH: 28.3 pg (ref 27.2–33.4)
MCHC: 33.7 g/dL (ref 32.0–36.0)
MCV: 83.9 fL (ref 79.3–98.0)
MONO%: 7.6 % (ref 0.0–14.0)
Platelets: 166 10*3/uL (ref 140–400)
RBC: 3.78 10*6/uL — ABNORMAL LOW (ref 4.20–5.82)
WBC: 3.7 10*3/uL — ABNORMAL LOW (ref 4.0–10.3)

## 2009-11-24 LAB — FERRITIN: Ferritin: 47 ng/mL (ref 22–322)

## 2009-11-24 LAB — BASIC METABOLIC PANEL
Calcium: 9.5 mg/dL (ref 8.4–10.5)
Creatinine, Ser: 1.07 mg/dL (ref 0.40–1.50)
Sodium: 143 mEq/L (ref 135–145)

## 2009-12-17 ENCOUNTER — Ambulatory Visit: Payer: Self-pay | Admitting: Oncology

## 2009-12-21 LAB — CBC WITH DIFFERENTIAL/PLATELET
Basophils Absolute: 0 10*3/uL (ref 0.0–0.1)
EOS%: 1.7 % (ref 0.0–7.0)
Eosinophils Absolute: 0.1 10*3/uL (ref 0.0–0.5)
HGB: 11.3 g/dL — ABNORMAL LOW (ref 13.0–17.1)
MCH: 28.1 pg (ref 27.2–33.4)
NEUT#: 3.6 10*3/uL (ref 1.5–6.5)
RBC: 4.01 10*6/uL — ABNORMAL LOW (ref 4.20–5.82)
RDW: 15.8 % — ABNORMAL HIGH (ref 11.0–14.6)
lymph#: 0.7 10*3/uL — ABNORMAL LOW (ref 0.9–3.3)

## 2010-01-05 ENCOUNTER — Inpatient Hospital Stay (HOSPITAL_COMMUNITY): Admission: EM | Admit: 2010-01-05 | Discharge: 2010-01-07 | Payer: Self-pay | Admitting: Emergency Medicine

## 2010-01-05 ENCOUNTER — Ambulatory Visit: Payer: Self-pay | Admitting: Cardiovascular Disease

## 2010-01-06 ENCOUNTER — Encounter (INDEPENDENT_AMBULATORY_CARE_PROVIDER_SITE_OTHER): Payer: Self-pay | Admitting: Internal Medicine

## 2010-01-06 ENCOUNTER — Ambulatory Visit: Payer: Self-pay | Admitting: Vascular Surgery

## 2010-01-18 ENCOUNTER — Ambulatory Visit: Payer: Self-pay | Admitting: Oncology

## 2010-01-21 LAB — COMPREHENSIVE METABOLIC PANEL
ALT: 29 U/L (ref 0–53)
Albumin: 4.4 g/dL (ref 3.5–5.2)
CO2: 27 mEq/L (ref 19–32)
Calcium: 9.2 mg/dL (ref 8.4–10.5)
Chloride: 103 mEq/L (ref 96–112)
Glucose, Bld: 149 mg/dL — ABNORMAL HIGH (ref 70–99)
Potassium: 4.3 mEq/L (ref 3.5–5.3)
Sodium: 141 mEq/L (ref 135–145)
Total Bilirubin: 0.5 mg/dL (ref 0.3–1.2)
Total Protein: 6.4 g/dL (ref 6.0–8.3)

## 2010-01-21 LAB — CBC WITH DIFFERENTIAL/PLATELET
Basophils Absolute: 0 10*3/uL (ref 0.0–0.1)
Eosinophils Absolute: 0.3 10*3/uL (ref 0.0–0.5)
HGB: 11.3 g/dL — ABNORMAL LOW (ref 13.0–17.1)
MCV: 84.4 fL (ref 79.3–98.0)
MONO#: 0.2 10*3/uL (ref 0.1–0.9)
MONO%: 7.2 % (ref 0.0–14.0)
NEUT#: 1.6 10*3/uL (ref 1.5–6.5)
RBC: 4.07 10*6/uL — ABNORMAL LOW (ref 4.20–5.82)
RDW: 15.7 % — ABNORMAL HIGH (ref 11.0–14.6)
WBC: 3.1 10*3/uL — ABNORMAL LOW (ref 4.0–10.3)

## 2010-01-26 LAB — BCR/ABL (LIO MMD)

## 2010-02-12 LAB — COMPREHENSIVE METABOLIC PANEL
ALT: <8 U/L (ref 0–53)
Albumin: 4.4 g/dL (ref 3.5–5.2)
Alkaline Phosphatase: 68 U/L (ref 39–117)
CO2: 26 mEq/L (ref 19–32)
Glucose, Bld: 103 mg/dL — ABNORMAL HIGH (ref 70–99)
Potassium: 3.5 mEq/L (ref 3.5–5.3)
Sodium: 142 mEq/L (ref 135–145)
Total Bilirubin: 0.4 mg/dL (ref 0.3–1.2)
Total Protein: 6.7 g/dL (ref 6.0–8.3)

## 2010-02-12 LAB — CBC WITH DIFFERENTIAL/PLATELET
BASO%: 0.6 % (ref 0.0–2.0)
Eosinophils Absolute: 0.4 10*3/uL (ref 0.0–0.5)
LYMPH%: 27.1 % (ref 14.0–49.0)
MCHC: 32.9 g/dL (ref 32.0–36.0)
MONO#: 0.3 10*3/uL (ref 0.1–0.9)
NEUT#: 1.9 10*3/uL (ref 1.5–6.5)
Platelets: 177 10*3/uL (ref 140–400)
RBC: 4.16 10*6/uL — ABNORMAL LOW (ref 4.20–5.82)
RDW: 14.6 % (ref 11.0–14.6)
WBC: 3.6 10*3/uL — ABNORMAL LOW (ref 4.0–10.3)
lymph#: 1 10*3/uL (ref 0.9–3.3)

## 2010-02-19 ENCOUNTER — Ambulatory Visit: Payer: Self-pay | Admitting: Oncology

## 2010-03-12 LAB — CBC WITH DIFFERENTIAL/PLATELET
BASO%: 0.3 % (ref 0.0–2.0)
LYMPH%: 35.1 % (ref 14.0–49.0)
MCHC: 32 g/dL (ref 32.0–36.0)
MCV: 79.7 fL (ref 79.3–98.0)
MONO%: 8.3 % (ref 0.0–14.0)
NEUT#: 1.9 10*3/uL (ref 1.5–6.5)
Platelets: 141 10*3/uL (ref 140–400)
RBC: 4.24 10*6/uL (ref 4.20–5.82)
RDW: 15 % — ABNORMAL HIGH (ref 11.0–14.6)
WBC: 4 10*3/uL (ref 4.0–10.3)
nRBC: 0 % (ref 0–0)

## 2010-03-12 LAB — COMPREHENSIVE METABOLIC PANEL
ALT: 14 U/L (ref 0–53)
AST: 22 U/L (ref 0–37)
Alkaline Phosphatase: 56 U/L (ref 39–117)
Creatinine, Ser: 1.14 mg/dL (ref 0.40–1.50)
Sodium: 143 mEq/L (ref 135–145)
Total Bilirubin: 0.5 mg/dL (ref 0.3–1.2)
Total Protein: 6.1 g/dL (ref 6.0–8.3)

## 2010-04-03 ENCOUNTER — Emergency Department (HOSPITAL_COMMUNITY): Admission: EM | Admit: 2010-04-03 | Discharge: 2010-04-03 | Payer: Self-pay | Admitting: Emergency Medicine

## 2010-04-06 ENCOUNTER — Ambulatory Visit: Payer: Self-pay | Admitting: Oncology

## 2010-04-08 LAB — COMPREHENSIVE METABOLIC PANEL
AST: 14 U/L (ref 0–37)
BUN: 13 mg/dL (ref 6–23)
Calcium: 9.2 mg/dL (ref 8.4–10.5)
Chloride: 103 mEq/L (ref 96–112)
Creatinine, Ser: 1.29 mg/dL (ref 0.40–1.50)
Total Bilirubin: 0.6 mg/dL (ref 0.3–1.2)

## 2010-04-08 LAB — CBC WITH DIFFERENTIAL/PLATELET
BASO%: 0.5 % (ref 0.0–2.0)
EOS%: 6.9 % (ref 0.0–7.0)
HCT: 35.8 % — ABNORMAL LOW (ref 38.4–49.9)
LYMPH%: 32.3 % (ref 14.0–49.0)
MCH: 25.7 pg — ABNORMAL LOW (ref 27.2–33.4)
MCHC: 32.8 g/dL (ref 32.0–36.0)
MCV: 78.6 fL — ABNORMAL LOW (ref 79.3–98.0)
MONO#: 0.4 10*3/uL (ref 0.1–0.9)
MONO%: 9 % (ref 0.0–14.0)
NEUT%: 51.3 % (ref 39.0–75.0)
Platelets: 182 10*3/uL (ref 140–400)

## 2010-05-12 ENCOUNTER — Ambulatory Visit (HOSPITAL_BASED_OUTPATIENT_CLINIC_OR_DEPARTMENT_OTHER): Payer: Medicaid Other | Admitting: Oncology

## 2010-05-13 ENCOUNTER — Inpatient Hospital Stay (HOSPITAL_COMMUNITY)
Admission: RE | Admit: 2010-05-13 | Discharge: 2010-05-17 | Disposition: A | Discharge status: Rehabilitation Facility | Payer: Self-pay | Source: Home / Self Care | Attending: Orthopedic Surgery | Admitting: Orthopedic Surgery

## 2010-05-13 LAB — ABO/RH: ABO/RH(D): O POS

## 2010-05-14 LAB — CBC
HCT: 25.4 % — ABNORMAL LOW (ref 39.0–52.0)
Hemoglobin: 7.9 g/dL — ABNORMAL LOW (ref 13.0–17.0)
MCH: 24.7 pg — ABNORMAL LOW (ref 26.0–34.0)
MCHC: 31.1 g/dL (ref 30.0–36.0)
MCV: 79.4 fL (ref 78.0–100.0)
Platelets: 135 10*3/uL — ABNORMAL LOW (ref 150–400)
RBC: 3.2 MIL/uL — ABNORMAL LOW (ref 4.22–5.81)
RDW: 16.2 % — ABNORMAL HIGH (ref 11.5–15.5)
WBC: 7 10*3/uL (ref 4.0–10.5)

## 2010-05-14 LAB — BASIC METABOLIC PANEL
BUN: 16 mg/dL (ref 6–23)
CO2: 26 mEq/L (ref 19–32)
Calcium: 8.2 mg/dL — ABNORMAL LOW (ref 8.4–10.5)
Chloride: 109 mEq/L (ref 96–112)
Creatinine, Ser: 1.36 mg/dL (ref 0.4–1.5)
GFR calc Af Amer: >60 mL/min (ref >60)
GFR calc non Af Amer: 53 mL/min — ABNORMAL LOW (ref >60)
Glucose, Bld: 175 mg/dL — ABNORMAL HIGH (ref 70–99)
Potassium: 3.9 mEq/L (ref 3.5–5.1)
Sodium: 142 mEq/L (ref 135–145)

## 2010-05-14 LAB — PREPARE RBC (CROSSMATCH)

## 2010-05-24 LAB — BASIC METABOLIC PANEL
BUN: 15 mg/dL (ref 6–23)
CO2: 30 mEq/L (ref 19–32)
Calcium: 8.3 mg/dL — ABNORMAL LOW (ref 8.4–10.5)
Chloride: 108 mEq/L (ref 96–112)
Creatinine, Ser: 1.19 mg/dL (ref 0.4–1.5)
GFR calc Af Amer: >60 mL/min (ref >60)
GFR calc non Af Amer: >60 mL/min (ref >60)
Glucose, Bld: 122 mg/dL — ABNORMAL HIGH (ref 70–99)
Potassium: 4.2 mEq/L (ref 3.5–5.1)
Sodium: 143 mEq/L (ref 135–145)

## 2010-05-24 LAB — CROSSMATCH
ABO/RH(D): O POS
Antibody Screen: NEGATIVE
Unit division: 0
Unit division: 0

## 2010-05-24 LAB — CBC
HCT: 27.6 % — ABNORMAL LOW (ref 39.0–52.0)
HCT: 28.7 % — ABNORMAL LOW (ref 39.0–52.0)
Hemoglobin: 9 g/dL — ABNORMAL LOW (ref 13.0–17.0)
Hemoglobin: 9.1 g/dL — ABNORMAL LOW (ref 13.0–17.0)
MCH: 26.2 pg (ref 26.0–34.0)
MCH: 25.8 pg — ABNORMAL LOW (ref 26.0–34.0)
MCHC: 32.6 g/dL (ref 30.0–36.0)
MCHC: 31.7 g/dL (ref 30.0–36.0)
MCV: 80.5 fL (ref 78.0–100.0)
MCV: 81.3 fL (ref 78.0–100.0)
Platelets: 109 10*3/uL — ABNORMAL LOW (ref 150–400)
Platelets: 127 10*3/uL — ABNORMAL LOW (ref 150–400)
RBC: 3.43 MIL/uL — ABNORMAL LOW (ref 4.22–5.81)
RBC: 3.53 MIL/uL — ABNORMAL LOW (ref 4.22–5.81)
RDW: 16 % — ABNORMAL HIGH (ref 11.5–15.5)
RDW: 16.2 % — ABNORMAL HIGH (ref 11.5–15.5)
WBC: 6.4 10*3/uL (ref 4.0–10.5)
WBC: 5.8 10*3/uL (ref 4.0–10.5)

## 2010-05-24 LAB — CARDIAC PANEL(CRET KIN+CKTOT+MB+TROPI)
CK, MB: 1.3 ng/mL (ref 0.3–4.0)
Relative Index: 0.8 (ref 0.0–2.5)
Total CK: 172 U/L (ref 7–232)
Troponin I: 0.01 ng/mL (ref 0.00–0.06)

## 2010-05-29 ENCOUNTER — Encounter: Payer: Self-pay | Admitting: Interventional Radiology

## 2010-05-29 ENCOUNTER — Encounter: Payer: Self-pay | Admitting: Surgery

## 2010-06-08 ENCOUNTER — Emergency Department (HOSPITAL_COMMUNITY): Admission: EM | Admit: 2010-06-08 | Payer: Self-pay | Source: Home / Self Care | Admitting: Emergency Medicine

## 2010-06-08 ENCOUNTER — Emergency Department (HOSPITAL_COMMUNITY)
Admission: EM | Admit: 2010-06-08 | Discharge: 2010-06-08 | Payer: Self-pay | Source: Home / Self Care | Admitting: Emergency Medicine

## 2010-06-21 ENCOUNTER — Ambulatory Visit: Payer: Medicaid Other | Admitting: Physical Therapy

## 2010-06-24 ENCOUNTER — Encounter (HOSPITAL_BASED_OUTPATIENT_CLINIC_OR_DEPARTMENT_OTHER): Payer: Medicaid Other | Admitting: Oncology

## 2010-06-24 DIAGNOSIS — F329 Major depressive disorder, single episode, unspecified: Secondary | ICD-10-CM

## 2010-06-24 DIAGNOSIS — D649 Anemia, unspecified: Secondary | ICD-10-CM

## 2010-06-24 DIAGNOSIS — D6481 Anemia due to antineoplastic chemotherapy: Secondary | ICD-10-CM

## 2010-06-24 DIAGNOSIS — C9210 Chronic myeloid leukemia, BCR/ABL-positive, not having achieved remission: Secondary | ICD-10-CM

## 2010-06-24 DIAGNOSIS — T451X5A Adverse effect of antineoplastic and immunosuppressive drugs, initial encounter: Secondary | ICD-10-CM

## 2010-06-24 LAB — CBC WITH DIFFERENTIAL/PLATELET
Basophils Absolute: 0 10*3/uL (ref 0.0–0.1)
HCT: 33.6 % — ABNORMAL LOW (ref 38.4–49.9)
HGB: 11 g/dL — ABNORMAL LOW (ref 13.0–17.1)
MCH: 26 pg — ABNORMAL LOW (ref 27.2–33.4)
MONO#: 0.4 10*3/uL (ref 0.1–0.9)
NEUT%: 74.3 % (ref 39.0–75.0)
WBC: 6 10*3/uL (ref 4.0–10.3)
lymph#: 0.8 10*3/uL — ABNORMAL LOW (ref 0.9–3.3)

## 2010-07-02 ENCOUNTER — Ambulatory Visit: Payer: Medicaid Other | Attending: Orthopedic Surgery | Admitting: Physical Therapy

## 2010-07-02 DIAGNOSIS — M25669 Stiffness of unspecified knee, not elsewhere classified: Secondary | ICD-10-CM | POA: Insufficient documentation

## 2010-07-02 DIAGNOSIS — IMO0001 Reserved for inherently not codable concepts without codable children: Secondary | ICD-10-CM | POA: Insufficient documentation

## 2010-07-02 DIAGNOSIS — M25569 Pain in unspecified knee: Secondary | ICD-10-CM | POA: Insufficient documentation

## 2010-07-02 DIAGNOSIS — R269 Unspecified abnormalities of gait and mobility: Secondary | ICD-10-CM | POA: Insufficient documentation

## 2010-07-12 ENCOUNTER — Ambulatory Visit: Payer: Medicaid Other | Attending: Orthopedic Surgery | Admitting: Physical Therapy

## 2010-07-12 DIAGNOSIS — R269 Unspecified abnormalities of gait and mobility: Secondary | ICD-10-CM | POA: Insufficient documentation

## 2010-07-12 DIAGNOSIS — M25669 Stiffness of unspecified knee, not elsewhere classified: Secondary | ICD-10-CM | POA: Insufficient documentation

## 2010-07-12 DIAGNOSIS — IMO0001 Reserved for inherently not codable concepts without codable children: Secondary | ICD-10-CM | POA: Insufficient documentation

## 2010-07-12 DIAGNOSIS — M25569 Pain in unspecified knee: Secondary | ICD-10-CM | POA: Insufficient documentation

## 2010-07-14 ENCOUNTER — Encounter: Payer: Medicaid Other | Admitting: Physical Therapy

## 2010-07-19 ENCOUNTER — Encounter: Payer: Medicaid Other | Admitting: Physical Therapy

## 2010-07-19 LAB — COMPREHENSIVE METABOLIC PANEL
AST: 40 U/L — ABNORMAL HIGH (ref 0–37)
CO2: 30 mEq/L (ref 19–32)
Calcium: 9.7 mg/dL (ref 8.4–10.5)
Chloride: 105 mEq/L (ref 96–112)
Creatinine, Ser: 1.27 mg/dL (ref 0.4–1.5)
GFR calc Af Amer: >60 mL/min (ref >60)
GFR calc non Af Amer: 57 mL/min — ABNORMAL LOW (ref >60)
Glucose, Bld: 93 mg/dL (ref 70–99)
Total Bilirubin: 0.5 mg/dL (ref 0.3–1.2)

## 2010-07-19 LAB — PROTIME-INR
INR: 0.98 (ref 0.00–1.49)
Prothrombin Time: 13.2 seconds (ref 11.6–15.2)

## 2010-07-19 LAB — CBC
HCT: 37.7 % — ABNORMAL LOW (ref 39.0–52.0)
Hemoglobin: 11.9 g/dL — ABNORMAL LOW (ref 13.0–17.0)
MCH: 25.1 pg — ABNORMAL LOW (ref 26.0–34.0)
MCHC: 31.6 g/dL (ref 30.0–36.0)
MCV: 79.5 fL (ref 78.0–100.0)
RBC: 4.74 MIL/uL (ref 4.22–5.81)

## 2010-07-19 LAB — DIFFERENTIAL
Basophils Absolute: 0 10*3/uL (ref 0.0–0.1)
Eosinophils Absolute: 0.3 10*3/uL (ref 0.0–0.7)
Eosinophils Relative: 7 % — ABNORMAL HIGH (ref 0–5)
Lymphocytes Relative: 25 % (ref 12–46)
Lymphs Abs: 1 10*3/uL (ref 0.7–4.0)
Neutrophils Relative %: 59 % (ref 43–77)

## 2010-07-19 LAB — URINALYSIS, ROUTINE W REFLEX MICROSCOPIC
Bilirubin Urine: NEGATIVE
Glucose, UA: NEGATIVE mg/dL
Hgb urine dipstick: NEGATIVE
Ketones, ur: NEGATIVE mg/dL
Leukocytes, UA: NEGATIVE
Protein, ur: 30 mg/dL — AB
pH: 6 (ref 5.0–8.0)

## 2010-07-21 ENCOUNTER — Ambulatory Visit: Payer: Medicaid Other | Admitting: Physical Therapy

## 2010-07-21 LAB — URINE MICROSCOPIC-ADD ON

## 2010-07-21 LAB — URINALYSIS, ROUTINE W REFLEX MICROSCOPIC
Glucose, UA: NEGATIVE mg/dL
Hgb urine dipstick: NEGATIVE
Specific Gravity, Urine: 1.018 (ref 1.005–1.030)
Urobilinogen, UA: 0.2 mg/dL (ref 0.0–1.0)
pH: 6 (ref 5.0–8.0)

## 2010-07-21 LAB — DIFFERENTIAL
Basophils Absolute: 0 10*3/uL (ref 0.0–0.1)
Basophils Relative: 0 % (ref 0–1)
Eosinophils Absolute: 0.3 10*3/uL (ref 0.0–0.7)
Eosinophils Relative: 5 % (ref 0–5)
Lymphocytes Relative: 12 % (ref 12–46)
Lymphs Abs: 0.8 10*3/uL (ref 0.7–4.0)
Monocytes Absolute: 0.5 10*3/uL (ref 0.1–1.0)
Monocytes Relative: 8 % (ref 3–12)
Neutro Abs: 4.6 10*3/uL (ref 1.7–7.7)
Neutrophils Relative %: 74 % (ref 43–77)

## 2010-07-21 LAB — POCT CARDIAC MARKERS
CKMB, poc: 1.1 ng/mL (ref 1.0–8.0)
Myoglobin, poc: 148 ng/mL (ref 12–200)
Troponin i, poc: <0.05 ng/mL (ref 0.00–0.09)

## 2010-07-21 LAB — CBC
MCH: 24.7 pg — ABNORMAL LOW (ref 26.0–34.0)
MCV: 78.9 fL (ref 78.0–100.0)
Platelets: 144 10*3/uL — ABNORMAL LOW (ref 150–400)
RDW: 15 % (ref 11.5–15.5)
WBC: 6.2 10*3/uL (ref 4.0–10.5)

## 2010-07-21 LAB — COMPREHENSIVE METABOLIC PANEL
AST: 22 U/L (ref 0–37)
Albumin: 3.7 g/dL (ref 3.5–5.2)
BUN: 11 mg/dL (ref 6–23)
Calcium: 8.8 mg/dL (ref 8.4–10.5)
Creatinine, Ser: 1.13 mg/dL (ref 0.4–1.5)
GFR calc Af Amer: >60 mL/min (ref >60)
Total Protein: 6.2 g/dL (ref 6.0–8.3)

## 2010-07-22 LAB — CBC
HCT: 30 % — ABNORMAL LOW (ref 39.0–52.0)
Hemoglobin: 9.6 g/dL — ABNORMAL LOW (ref 13.0–17.0)
MCH: 27.2 pg (ref 26.0–34.0)
MCHC: 32 g/dL (ref 30.0–36.0)

## 2010-07-22 LAB — HEPATIC FUNCTION PANEL
Alkaline Phosphatase: 47 U/L (ref 39–117)
Total Bilirubin: 0.3 mg/dL (ref 0.3–1.2)
Total Protein: 4.9 g/dL — ABNORMAL LOW (ref 6.0–8.3)

## 2010-07-22 LAB — BASIC METABOLIC PANEL
CO2: 27 mEq/L (ref 19–32)
Calcium: 8.2 mg/dL — ABNORMAL LOW (ref 8.4–10.5)
Glucose, Bld: 96 mg/dL (ref 70–99)
Sodium: 145 mEq/L (ref 135–145)

## 2010-07-23 LAB — BASIC METABOLIC PANEL
CO2: 26 mEq/L (ref 19–32)
CO2: 27 mEq/L (ref 19–32)
Calcium: 8.8 mg/dL (ref 8.4–10.5)
Chloride: 106 mEq/L (ref 96–112)
Creatinine, Ser: 1.18 mg/dL (ref 0.4–1.5)
Glucose, Bld: 116 mg/dL — ABNORMAL HIGH (ref 70–99)
Glucose, Bld: 140 mg/dL — ABNORMAL HIGH (ref 70–99)
Potassium: 3 mEq/L — ABNORMAL LOW (ref 3.5–5.1)
Sodium: 140 mEq/L (ref 135–145)
Sodium: 140 mEq/L (ref 135–145)

## 2010-07-23 LAB — DIFFERENTIAL
Basophils Relative: 0 % (ref 0–1)
Eosinophils Absolute: 0.3 10*3/uL (ref 0.0–0.7)
Monocytes Absolute: 0.3 10*3/uL (ref 0.1–1.0)
Monocytes Relative: 8 % (ref 3–12)
Neutro Abs: 2 10*3/uL (ref 1.7–7.7)

## 2010-07-23 LAB — URINALYSIS, ROUTINE W REFLEX MICROSCOPIC
Bilirubin Urine: NEGATIVE
Glucose, UA: NEGATIVE mg/dL
Hgb urine dipstick: NEGATIVE
Specific Gravity, Urine: 1.021 (ref 1.005–1.030)
Urobilinogen, UA: 1 mg/dL (ref 0.0–1.0)

## 2010-07-23 LAB — FERRITIN: Ferritin: 17 ng/mL — ABNORMAL LOW (ref 22–322)

## 2010-07-23 LAB — IRON AND TIBC
Iron: 49 ug/dL (ref 42–135)
TIBC: 280 ug/dL (ref 215–435)

## 2010-07-23 LAB — RAPID URINE DRUG SCREEN, HOSP PERFORMED: Barbiturates: NOT DETECTED

## 2010-07-23 LAB — CK TOTAL AND CKMB (NOT AT ARMC): CK, MB: 2.3 ng/mL (ref 0.3–4.0)

## 2010-07-23 LAB — CBC
HCT: 34.4 % — ABNORMAL LOW (ref 39.0–52.0)
Hemoglobin: 11 g/dL — ABNORMAL LOW (ref 13.0–17.0)
Hemoglobin: 9.9 g/dL — ABNORMAL LOW (ref 13.0–17.0)
MCH: 26.3 pg (ref 26.0–34.0)
MCH: 27 pg (ref 26.0–34.0)
MCHC: 32 g/dL (ref 30.0–36.0)
MCHC: 32.2 g/dL (ref 30.0–36.0)

## 2010-07-23 LAB — CARDIAC PANEL(CRET KIN+CKTOT+MB+TROPI)
CK, MB: 2 ng/mL (ref 0.3–4.0)
Relative Index: 1.2 (ref 0.0–2.5)
Total CK: 183 U/L (ref 7–232)
Troponin I: 0.01 ng/mL (ref 0.00–0.06)

## 2010-07-23 LAB — GLUCOSE, CAPILLARY

## 2010-07-23 LAB — RETICULOCYTES: Retic Count, Absolute: 103 10*3/uL (ref 19.0–186.0)

## 2010-07-23 LAB — POCT CARDIAC MARKERS
CKMB, poc: <1 ng/mL — ABNORMAL LOW (ref 1.0–8.0)
Myoglobin, poc: 83.5 ng/mL (ref 12–200)

## 2010-07-23 LAB — CULTURE, BLOOD (ROUTINE X 2): Culture: NO GROWTH

## 2010-07-23 LAB — LIPID PANEL
Cholesterol: 112 mg/dL (ref 0–200)
Total CHOL/HDL Ratio: 3.5 RATIO

## 2010-07-23 LAB — VITAMIN B12: Vitamin B-12: 422 pg/mL (ref 211–911)

## 2010-07-23 LAB — TSH: TSH: 0.929 u[IU]/mL (ref 0.350–4.500)

## 2010-08-11 LAB — POCT I-STAT, CHEM 8
BUN: 13 mg/dL (ref 6–23)
Calcium, Ion: 1.13 mmol/L (ref 1.12–1.32)
Chloride: 106 mEq/L (ref 96–112)
Creatinine, Ser: 1.4 mg/dL (ref 0.4–1.5)
TCO2: 21 mmol/L (ref 0–100)

## 2010-08-11 LAB — DIFFERENTIAL
Eosinophils Relative: 5 % (ref 0–5)
Lymphocytes Relative: 14 % (ref 12–46)
Lymphs Abs: 0.8 10*3/uL (ref 0.7–4.0)
Monocytes Absolute: 0.3 10*3/uL (ref 0.1–1.0)
Monocytes Relative: 5 % (ref 3–12)

## 2010-08-11 LAB — CBC
HCT: 36.9 % — ABNORMAL LOW (ref 39.0–52.0)
Hemoglobin: 11.9 g/dL — ABNORMAL LOW (ref 13.0–17.0)
RBC: 4.47 MIL/uL (ref 4.22–5.81)
WBC: 5.6 10*3/uL (ref 4.0–10.5)

## 2010-08-17 LAB — LIPASE, BLOOD: Lipase: 26 U/L (ref 11–59)

## 2010-08-17 LAB — CBC
HCT: 31.3 % — ABNORMAL LOW (ref 39.0–52.0)
Hemoglobin: 10.7 g/dL — ABNORMAL LOW (ref 13.0–17.0)
MCV: 81.1 fL (ref 78.0–100.0)
Platelets: 130 10*3/uL — ABNORMAL LOW (ref 150–400)
RDW: 16.5 % — ABNORMAL HIGH (ref 11.5–15.5)
WBC: 3.1 10*3/uL — ABNORMAL LOW (ref 4.0–10.5)

## 2010-08-17 LAB — POCT CARDIAC MARKERS
CKMB, poc: <1 ng/mL — ABNORMAL LOW (ref 1.0–8.0)
Myoglobin, poc: 38.5 ng/mL (ref 12–200)

## 2010-08-17 LAB — URINALYSIS, ROUTINE W REFLEX MICROSCOPIC
Glucose, UA: NEGATIVE mg/dL
Hgb urine dipstick: NEGATIVE
Ketones, ur: NEGATIVE mg/dL
Protein, ur: NEGATIVE mg/dL
pH: 5.5 (ref 5.0–8.0)

## 2010-08-17 LAB — COMPREHENSIVE METABOLIC PANEL
Albumin: 3.5 g/dL (ref 3.5–5.2)
Alkaline Phosphatase: 52 U/L (ref 39–117)
BUN: 12 mg/dL (ref 6–23)
Chloride: 105 mEq/L (ref 96–112)
Creatinine, Ser: 1.23 mg/dL (ref 0.4–1.5)
Glucose, Bld: 118 mg/dL — ABNORMAL HIGH (ref 70–99)
Total Bilirubin: 0.7 mg/dL (ref 0.3–1.2)

## 2010-08-17 LAB — DIFFERENTIAL
Basophils Absolute: 0 10*3/uL (ref 0.0–0.1)
Basophils Relative: 1 % (ref 0–1)
Lymphocytes Relative: 31 % (ref 12–46)
Monocytes Absolute: 0.3 10*3/uL (ref 0.1–1.0)
Neutro Abs: 1.5 10*3/uL — ABNORMAL LOW (ref 1.7–7.7)
Neutrophils Relative %: 49 % (ref 43–77)

## 2010-08-19 ENCOUNTER — Encounter (HOSPITAL_BASED_OUTPATIENT_CLINIC_OR_DEPARTMENT_OTHER): Payer: Medicaid Other | Admitting: Oncology

## 2010-08-19 ENCOUNTER — Other Ambulatory Visit: Payer: Self-pay | Admitting: Oncology

## 2010-08-19 DIAGNOSIS — K861 Other chronic pancreatitis: Secondary | ICD-10-CM

## 2010-08-19 DIAGNOSIS — C9210 Chronic myeloid leukemia, BCR/ABL-positive, not having achieved remission: Secondary | ICD-10-CM

## 2010-08-19 DIAGNOSIS — F329 Major depressive disorder, single episode, unspecified: Secondary | ICD-10-CM

## 2010-08-19 DIAGNOSIS — R197 Diarrhea, unspecified: Secondary | ICD-10-CM

## 2010-08-19 LAB — CBC WITH DIFFERENTIAL/PLATELET
Basophils Absolute: 0 10*3/uL (ref 0.0–0.1)
EOS%: 9 % — ABNORMAL HIGH (ref 0.0–7.0)
HCT: 35.9 % — ABNORMAL LOW (ref 38.4–49.9)
HGB: 11.7 g/dL — ABNORMAL LOW (ref 13.0–17.1)
MCH: 25.2 pg — ABNORMAL LOW (ref 27.2–33.4)
MCV: 77.2 fL — ABNORMAL LOW (ref 79.3–98.0)
MONO%: 8.7 % (ref 0.0–14.0)
NEUT%: 44.4 % (ref 39.0–75.0)

## 2010-08-19 LAB — COMPREHENSIVE METABOLIC PANEL
Albumin: 3.9 g/dL (ref 3.5–5.2)
Alkaline Phosphatase: 74 U/L (ref 39–117)
BUN: 9 mg/dL (ref 6–23)
Glucose, Bld: 92 mg/dL (ref 70–99)
Total Bilirubin: 0.5 mg/dL (ref 0.3–1.2)
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
## 2010-09-21 NOTE — Op Note (Signed)
[name], [name]                ACCOUNT NO.:  [id number]   MEDICAL RECORD NO.:  [id number]          PATIENT TYPE:  AMB   LOCATION:  [place]                          FACILITY:  [facility]   PHYSICIAN:  Lind Guest. Ninfa Linden, M.D.DATE OF BIRTH:  1945-09-07   DATE OF PROCEDURE:  05/25/2007  DATE OF DISCHARGE:                               OPERATIVE REPORT   PREOPERATIVE DIAGNOSIS:  Left knee pain with degenerative joint disease.   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   PROCEDURE:  Left knee arthroscopy with extensive debridement and  chondroplasty.   SURGEON:  Lind Guest. Ninfa Linden, MD.   ANESTHESIA:  General.   BLOOD LOSS:  Minimal.   COMPLICATIONS:  None.   INDICATIONS:  Briefly Mr. Sisco is a 65 year old gentleman who has had  left knee pain for some time now. He has a right above-knee amputation  and he wears a prosthesis and I think this has put a lot of pressure on  his knee over the last several years. X-ray evidence showed degenerative  arthritis in his knee. His range of motion was excellent and his main  pain was in the medial joint line.  I told him that arthroscopic surgery  could help temporize these things. We had tried injections, anti-  inflammatories and rest and he got an appointment where he wanted to  proceed with surgery and I believe this is reasonable. One thought is  that he may need a total knee replacement at some point in the future  however, due to the energy expenditure, this was required with his  prosthetic leg as well and he wanted to try to do as minimal as  possible. The risks and benefits of the surgery were explained to him  and well understood and he agrees to proceed with surgery.   PROCEDURE:  After informed consent was obtained, the appropriate left  leg was marked. He was brought the operating room, placed supine on the  operating table.  General anesthesia was then obtained.  His left leg  was then prepped and draped with Hibiclens scrub and alcohol. A  nonsterile tourniquet was placed around his upper thigh prior to  prepping. A lateral leg brace was utilized. A  time-out was called and  he was identified as the correct patient and extremity. I then had the  leg flexed off the side of the table and an anterolateral portal was  then made at the level of the inferior pole patella just lateral to the  tendon and the arthroscope was inserted.  Once the cannulas was  inserted, a large infusion was encountered.  I then right away went to  the medial compartment and placed an anterior medial portal at the same  level. Right away you could see there was a lot of crystallization in  the knee from previous steroid injections. He had areas of articular  cartilage defects underneath patella.  The patellofemoral joint, the  medial and lateral femoral condyles. The meniscus were actually intact  on  both sides.  There was significant degenerative changes in the knee  joint itself.  With an arthroscopic shaver, I carried out a  chondroplasty on the medial and lateral femoral condyles as well as the  patella and the patellofemoral joint.  The remainder of the knee scope  was for debriding the suprapatellar space of crystalloid tissue as well  as the anterior knee. I then allowed fluid to lavage through the knee.  All instrumentation was removed and I drained that effusion from the  knee. I closed the portal sites with interrupted 4-0 nylon suture. The  knee was infiltrated with a Marcaine morphine mixture as well as the  portal site. A well padded sterile dressing was applied. The patient was  awakened, extubated and taken to the recovery room in stable condition.  Postoperatively allowed to weightbear as tolerated and gradually  increase his activities. Will need him on antiinflammatories and he may  be a perfect  candidate for viscus supplementation of the knee at some  point.      Lind Guest. Ninfa Linden, M.D.  Electronically Signed     CYB/MEDQ  D:  05/25/2007  T:  05/25/2007  Job:  [id number]

## 2010-09-21 NOTE — Consult Note (Signed)
NAMERUBIN, KAI NO.:  [id number]   MEDICAL RECORD NO.:  [id number]          PATIENT TYPE:  INP   LOCATION:  5029                         FACILITY:  [facility]   PHYSICIAN:  Anderson Malta, M.D.    DATE OF BIRTH:  11/10/1945   DATE OF CONSULTATION:  06/01/2007  DATE OF DISCHARGE:                                 CONSULTATION   CHIEF COMPLAINT:  Left knee pain.   HISTORY OF PRESENT ILLNESS:  [name] is a 65 year old patient who  had a knee arthroscopy about a week ago, performed by Dr. Ninfa Linden.  He  has been having some swelling and pain since then.  He has gone through  about 90 Percocet.  Denies much in the way of definite shaking fever or  chills, but he has been having a significant amount of pain.   On exam, he does have an effusion of the knee.  He has reasonable range  of motion of about a 40-degree arc.  Portal incisions are intact.  There  is a pretty significant ecchymosis up and down the leg.  There is a  negative Homans sign.  No proximal lymphadenopathy.  No real erythema.  No crepitus of the tissues.  White count is pending.   IMPRESSION:  Knee effusion.  The patient had a knee arthroscopy and was  on Plavix preoperatively.   PLAN:  I aspirated the knee today and obtained about half of the 80 or  90 mL of blood that was in the knee, sent that for Gram stain, culture,  cell count.  I am going to refill his Percocet, have him use ice and  compression, and have him see Dr. Ninfa Linden on Monday.  Index of  suspicion for infection is low at this time.      Anderson Malta, M.D.  Electronically Signed     GSD/MEDQ  D:  06/01/2007  T:  06/02/2007  Job:  [id number]

## 2010-09-21 NOTE — Op Note (Signed)
NAME:  [name], [name] NO.:  [id number]   MEDICAL RECORD NO.:  [id number]          PATIENT TYPE:  INP   LOCATION:  5029                         FACILITY:  [facility]   PHYSICIAN:  Anderson Malta, M.D.    DATE OF BIRTH:  18-Nov-1945   DATE OF PROCEDURE:  06/02/2007  DATE OF DISCHARGE:                               OPERATIVE REPORT   PREOPERATIVE DIAGNOSIS:  Left knee increased white count, status post  arthroscopy.   POSTOPERATIVE DIAGNOSIS:  1. Left knee increased white count, status post arthroscopy.  2. Left knee infection.   PROCEDURE:  Left knee arthroscopic I&D and synovectomy.   SURGEON:  Anderson Malta, M.D.   ASSISTANT:  None.   INDICATIONS:  [name] is a 65 year old patient who underwent knee  arthroscopy by Dr. Ninfa Linden about 7 days ago.  He has had progressive  pain and swelling in the knee; peripheral white count is 230,000.  White  count of fluid aspirated from the knee was 64,000.  He presents now for  arthroscopic washout.   OPERATIVE FINDINGS:  Significant synovitis and hematoma,  with clotted  white cells in the knee.  Significant arthritis in all compartments.  Cultures x2 obtained.   PROCEDURE IN DETAIL:  The patient was brought to the operating room,  where general endotracheal anesthesia was induced.  Vancomycin was given  after cultures were obtained.  Superolateral and superomedial portals  were made.  Irrigating solution was then pulsed through the knee.  Synovectomy was performed with the shaver.  Following synovectomy  debridement and irrigation of 6-9 liters of fluid through the knee, the  instruments were removed from the portals.  Hemovac drain was placed.  Portals were closed using 3-0 nylon suture.  The knee was dressed in a  bulky dressing.  The patient tolerated the procedure well without  immediate complications.      Anderson Malta, M.D.  Electronically Signed     GSD/MEDQ  D:  06/02/2007  T:  06/02/2007  Job:   [id number]
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
## 2010-09-21 NOTE — Discharge Summary (Signed)
NAME:  THONG, RASPA NO.:  [id number]   MEDICAL RECORD NO.:  [id number]          PATIENT TYPE:  INP   LOCATION:  5029                         FACILITY:  [facility]   PHYSICIAN:  [name], M.D.    DATE OF BIRTH:  [birth year]   DATE OF ADMISSION:  06/01/2007  DATE OF DISCHARGE:  06/06/2007                               DISCHARGE SUMMARY   HISTORY OF PRESENT ILLNESS:  Mr. Stegen is a 65 year old African  American gentleman with past medical history significant for systemic  hypertension, peripheral vascular disease status post right above-knee  amputation, degenerative joint disease, tobacco abuse, coronary artery  disease status post stent over 10 years ago, a MRSA infection of the jaw  region in May 2008, gouty arthritis, who presented to the emergency room  at [facility] with a 1-week history of left knee swelling and  pain following arthroscopic surgery performed on May 25, 2007.  He  had been taking his pain medication at home without adequate relief.  He  had a poor appetite and vomited one time.  He had a sense of chills but  no fevers or swelling.  He had apparently lost about 20 pounds due to  poor intake.  He had mild abdominal pain with diarrhea.  At the  emergency room he was noted to be moderate to severe painful distress  involving the left knee.  This was swollen, tender and erythematous.  Orthopedic consult was requested and the patient had incision, and  drainage of a hematoma from the knee.  Initial laboratory data did show  elevated WBC above 20,000, so he was therefore admitted to the hospital  for further evaluation.   HOSPITAL COURSE:  On admission with vital signs were stable with blood  pressure 131/88, heart rate of 104, respiratory of 16, temperature 98.7,  O2 saturation was 100% on 2 L.  The left knee was swollen but dressed  with a drain inserted.  He had palpable tenderness and puffiness in the  left groin.  Laboratory data  showed a white count of 231,00, hematocrit  was 37.8, platelets count was 231.  The sodium was 141, potassium 3.0,  chloride 102, bicarbonate of 30, BUN 17, creatinine 1.0, glucose of 121,  AST was 45, AST 31, alkaline phosphatase was 125, total bilirubin 0.7,  albumin 3.2, calcium 9.3.  LDH was 715.  Sedimentation rate of 46.  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  The patient was kindly seen by Dr. [name], who  looked at his peripheral blood and also sent blood for cytogenetics.  Potassium was repleted both orally and intravenously.  The patient also  had elevated uric acid levels and was resumed on allopurinol and  colchicine.  He was also on IV antibiotics with Zosyn although his  elevated white count was thought to be due to infection.  He did have  some fever spikes and vancomycin was added to his regimen to cover  possible MRSA.  He was on PCA morphine for pain control with some  improvement.  The patient's condition improved and was followed by Dr.  Ninfa Linden, orthopedist, throughout his hospitalization.  His peripheral  blood FISH was positive for PCR APL compatible with CML.  He was  therefore started on oral Gleevec by Dr. [name] and was to be followed  up in the outpatient setting.  On June 06, 2007, the patient was  feeling much better, was ambulant and was afebrile.  Vital signs were  stable.  He was therefore considered stable for discharge home.   DISCHARGE DIAGNOSES:  1. Left knee effusion, status post arthroscopic surgery, status post      irrigation and drainage.  2. Chronic myeloid leukemia, recently diagnosed.  3. Hypokalemia, repleted.   DISCHARGE MEDICATIONS:  1. Percocet 10/325 mg one p.o. q.4-6 h. p.r.n.  2. Gleevec 400 mg once daily.  3. Augmentin 875 mg p.o. b.i.d. for 7 days.  4. Colchicine 0.6 mg b.i.d. p.r.n.  5. Allopurinol 300 mg daily.  6. Celebrex 200 grams p.o. daily.   He was to  follow up with me in the office in about 1-2 weeks, also to  follow up with Dr. [name], hematologist/oncologist, in 1 week and also  with Dr. Ninfa Linden in 1 week.      [name], M.D.  Electronically Signed     EA/MEDQ  D:  07/18/2007  T:  07/20/2007  Job:  [id number]

## 2010-09-23 ENCOUNTER — Ambulatory Visit (HOSPITAL_COMMUNITY)
Admission: RE | Admit: 2010-09-23 | Discharge: 2010-09-23 | Disposition: A | Discharge status: Home / Self Care | Payer: Medicare Other | Source: Ambulatory Visit

## 2010-09-23 ENCOUNTER — Other Ambulatory Visit (HOSPITAL_COMMUNITY): Payer: Medicare Other

## 2010-09-23 ENCOUNTER — Emergency Department (HOSPITAL_COMMUNITY): Payer: Medicare Other

## 2010-09-23 ENCOUNTER — Inpatient Hospital Stay (HOSPITAL_COMMUNITY)
Admission: EM | Admit: 2010-09-23 | Discharge: 2010-10-07 | DRG: 471 | Disposition: A | Discharge status: Skilled Nursing Facility | Payer: Medicare Other | Attending: Neurosurgery | Admitting: Neurosurgery

## 2010-09-23 DIAGNOSIS — W1809XA Striking against other object with subsequent fall, initial encounter: Secondary | ICD-10-CM | POA: Diagnosis present

## 2010-09-23 DIAGNOSIS — Y92009 Unspecified place in unspecified non-institutional (private) residence as the place of occurrence of the external cause: Secondary | ICD-10-CM

## 2010-09-23 DIAGNOSIS — S098XXA Other specified injuries of head, initial encounter: Secondary | ICD-10-CM

## 2010-09-23 DIAGNOSIS — I2510 Atherosclerotic heart disease of native coronary artery without angina pectoris: Secondary | ICD-10-CM | POA: Diagnosis present

## 2010-09-23 DIAGNOSIS — R52 Pain, unspecified: Secondary | ICD-10-CM

## 2010-09-23 DIAGNOSIS — M109 Gout, unspecified: Secondary | ICD-10-CM | POA: Diagnosis present

## 2010-09-23 DIAGNOSIS — J690 Pneumonitis due to inhalation of food and vomit: Secondary | ICD-10-CM | POA: Diagnosis not present

## 2010-09-23 DIAGNOSIS — S78119A Complete traumatic amputation at level between unspecified hip and knee, initial encounter: Secondary | ICD-10-CM

## 2010-09-23 DIAGNOSIS — Z96659 Presence of unspecified artificial knee joint: Secondary | ICD-10-CM

## 2010-09-23 DIAGNOSIS — I10 Essential (primary) hypertension: Secondary | ICD-10-CM | POA: Diagnosis present

## 2010-09-23 DIAGNOSIS — Z8673 Personal history of transient ischemic attack (TIA), and cerebral infarction without residual deficits: Secondary | ICD-10-CM

## 2010-09-23 DIAGNOSIS — S13101A Dislocation of unspecified cervical vertebrae, initial encounter: Secondary | ICD-10-CM | POA: Diagnosis present

## 2010-09-23 DIAGNOSIS — Z9861 Coronary angioplasty status: Secondary | ICD-10-CM

## 2010-09-23 DIAGNOSIS — M4712 Other spondylosis with myelopathy, cervical region: Principal | ICD-10-CM | POA: Diagnosis present

## 2010-09-23 DIAGNOSIS — E785 Hyperlipidemia, unspecified: Secondary | ICD-10-CM | POA: Diagnosis present

## 2010-09-23 LAB — POCT I-STAT, CHEM 8
HCT: 37 % — ABNORMAL LOW (ref 39.0–52.0)
Hemoglobin: 12.6 g/dL — ABNORMAL LOW (ref 13.0–17.0)
Potassium: 3.4 mEq/L — ABNORMAL LOW (ref 3.5–5.1)
Sodium: 144 mEq/L (ref 135–145)

## 2010-09-23 LAB — CBC
Hemoglobin: 11.4 g/dL — ABNORMAL LOW (ref 13.0–17.0)
MCH: 25.4 pg — ABNORMAL LOW (ref 26.0–34.0)
MCHC: 32.4 g/dL (ref 30.0–36.0)
Platelets: 158 10*3/uL (ref 150–400)
RDW: 16.4 % — ABNORMAL HIGH (ref 11.5–15.5)

## 2010-09-23 MED ORDER — GADOBENATE DIMEGLUMINE 529 MG/ML IV SOLN
15.0000 mL | Freq: Once | INTRAVENOUS | Status: AC
  Administered 2010-09-23: 15 mL via INTRAVENOUS
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
## 2010-09-28 ENCOUNTER — Other Ambulatory Visit (HOSPITAL_COMMUNITY): Payer: Medicare Other

## 2010-09-28 ENCOUNTER — Emergency Department (HOSPITAL_COMMUNITY)
Admission: RE | Admit: 2010-09-28 | Discharge: 2010-09-28 | Disposition: A | Discharge status: Home / Self Care | Payer: Medicare Other | Source: Ambulatory Visit

## 2010-09-28 ENCOUNTER — Inpatient Hospital Stay (HOSPITAL_COMMUNITY): Payer: Medicare Other

## 2010-09-28 ENCOUNTER — Ambulatory Visit (HOSPITAL_COMMUNITY)
Admission: RE | Admit: 2010-09-28 | Discharge: 2010-09-28 | Disposition: A | Discharge status: Home / Self Care | Payer: Medicare Other | Source: Ambulatory Visit

## 2010-09-28 DIAGNOSIS — M542 Cervicalgia: Secondary | ICD-10-CM

## 2010-09-28 LAB — SURGICAL PCR SCREEN: MRSA, PCR: NEGATIVE

## 2010-09-30 LAB — CBC
MCHC: 32.2 g/dL (ref 30.0–36.0)
MCV: 77 fL — ABNORMAL LOW (ref 78.0–100.0)
Platelets: 164 10*3/uL (ref 150–400)
RDW: 16.5 % — ABNORMAL HIGH (ref 11.5–15.5)
WBC: 13.8 10*3/uL — ABNORMAL HIGH (ref 4.0–10.5)

## 2010-09-30 LAB — BASIC METABOLIC PANEL
BUN: 22 mg/dL (ref 6–23)
Calcium: 8.9 mg/dL (ref 8.4–10.5)
Calcium: 8.6 mg/dL (ref 8.4–10.5)
Creatinine, Ser: 0.8 mg/dL (ref 0.4–1.5)
GFR calc Af Amer: >60 mL/min (ref >60)
GFR calc non Af Amer: >60 mL/min (ref >60)
Potassium: 3.4 mEq/L — ABNORMAL LOW (ref 3.5–5.1)
Sodium: 141 mEq/L (ref 135–145)

## 2010-09-30 LAB — MAGNESIUM: Magnesium: 2.1 mg/dL (ref 1.5–2.5)

## 2010-10-01 DIAGNOSIS — M4712 Other spondylosis with myelopathy, cervical region: Secondary | ICD-10-CM

## 2010-10-02 ENCOUNTER — Ambulatory Visit (HOSPITAL_COMMUNITY)
Admission: RE | Admit: 2010-10-02 | Discharge: 2010-10-02 | Disposition: A | Discharge status: Home / Self Care | Payer: Medicare Other | Source: Ambulatory Visit

## 2010-10-02 ENCOUNTER — Inpatient Hospital Stay (HOSPITAL_COMMUNITY): Payer: Medicare Other

## 2010-10-02 ENCOUNTER — Other Ambulatory Visit (HOSPITAL_COMMUNITY): Payer: Medicare Other

## 2010-10-02 DIAGNOSIS — W19XXXA Unspecified fall, initial encounter: Secondary | ICD-10-CM

## 2010-10-02 LAB — CBC
HCT: 39.1 % (ref 39.0–52.0)
RDW: 16.5 % — ABNORMAL HIGH (ref 11.5–15.5)
WBC: 13.4 10*3/uL — ABNORMAL HIGH (ref 4.0–10.5)

## 2010-10-02 LAB — COMPREHENSIVE METABOLIC PANEL
ALT: 14 U/L (ref 0–53)
AST: 12 U/L (ref 0–37)
Albumin: 2.6 g/dL — ABNORMAL LOW (ref 3.5–5.2)
Alkaline Phosphatase: 77 U/L (ref 39–117)
Calcium: 9.3 mg/dL (ref 8.4–10.5)
GFR calc Af Amer: >60 mL/min (ref >60)
Glucose, Bld: 125 mg/dL — ABNORMAL HIGH (ref 70–99)
Potassium: 3.6 mEq/L (ref 3.5–5.1)
Sodium: 136 mEq/L (ref 135–145)
Total Protein: 6.3 g/dL (ref 6.0–8.3)

## 2010-10-03 LAB — BASIC METABOLIC PANEL
BUN: 29 mg/dL — ABNORMAL HIGH (ref 6–23)
CO2: 32 mEq/L (ref 19–32)
Calcium: 9.1 mg/dL (ref 8.4–10.5)
Chloride: 96 mEq/L (ref 96–112)
Creatinine, Ser: 1.3 mg/dL (ref 0.4–1.5)
Glucose, Bld: 102 mg/dL — ABNORMAL HIGH (ref 70–99)

## 2010-10-03 LAB — URINALYSIS, ROUTINE W REFLEX MICROSCOPIC
Bilirubin Urine: NEGATIVE
Hgb urine dipstick: NEGATIVE
Ketones, ur: NEGATIVE mg/dL
Nitrite: NEGATIVE
Urobilinogen, UA: 2 mg/dL — ABNORMAL HIGH (ref 0.0–1.0)

## 2010-10-04 LAB — BASIC METABOLIC PANEL
BUN: 30 mg/dL — ABNORMAL HIGH (ref 6–23)
Chloride: 97 mEq/L (ref 96–112)
Creatinine, Ser: 1.36 mg/dL (ref 0.4–1.5)
Glucose, Bld: 120 mg/dL — ABNORMAL HIGH (ref 70–99)
Potassium: 3.6 mEq/L (ref 3.5–5.1)

## 2010-10-04 LAB — CBC
HCT: 34.3 % — ABNORMAL LOW (ref 39.0–52.0)
MCH: 25 pg — ABNORMAL LOW (ref 26.0–34.0)
MCV: 76.6 fL — ABNORMAL LOW (ref 78.0–100.0)
Platelets: 202 10*3/uL (ref 150–400)
RBC: 4.48 MIL/uL (ref 4.22–5.81)
RDW: 16.6 % — ABNORMAL HIGH (ref 11.5–15.5)
WBC: 14.8 10*3/uL — ABNORMAL HIGH (ref 4.0–10.5)

## 2010-10-05 ENCOUNTER — Inpatient Hospital Stay (HOSPITAL_COMMUNITY): Payer: Medicare Other

## 2010-10-05 ENCOUNTER — Inpatient Hospital Stay (HOSPITAL_COMMUNITY)
Admission: RE | Admit: 2010-10-05 | Discharge: 2010-10-05 | Disposition: A | Discharge status: Home / Self Care | Payer: Medicare Other | Source: Ambulatory Visit

## 2010-10-05 ENCOUNTER — Other Ambulatory Visit (HOSPITAL_COMMUNITY): Payer: Medicare Other

## 2010-10-05 DIAGNOSIS — M542 Cervicalgia: Secondary | ICD-10-CM

## 2010-10-07 NOTE — Discharge Summary (Signed)
[name], [name]                ACCOUNT NO.:  [id number]  MEDICAL RECORD NO.:  [id number]           PATIENT TYPE:  I  LOCATION:  [id number]                         FACILITY:  [place]  PHYSICIAN:  Cooper Render. Ashana Tullo, M.D.    DATE OF BIRTH:  1945/07/14  DATE OF ADMISSION:  09/23/2010 DATE OF DISCHARGE:  10/07/2010                              DISCHARGE SUMMARY   FINAL DIAGNOSIS: 1. 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 2. History of severe gouty arthritis. 3. Uncontrolled hypertension. 4. Aspiration pneumonia.  HISTORY OF PRESENT ILLNESS:  [name] is a 65 year old male who suffered a fall at home striking his head.  The patient has resultant neck pain and bilateral upper extremity weakness.  Workup was consistent with a central spinal cord injury.  MRI scanning demonstrates marked stenosis with central disk herniations at C3-C4 and C4-C5.  The patient has significant spondylosis at C5-C6 and C6-C7 as well, but it is not appeared to any significant acute injury at these levels.  The patient also noted to be extremely hypertensive and has been poorly compliant on his medicines.  HOSPITAL COURSE:  The patient is admitted to the hospital.  Medicine consult was obtained and the patient was placed on an antihypertensive regimen with better control of his hypertension.  He was subsequently taken to the operating room where he underwent an uncomplicated 123XX123 and 123XX123 anterior cervical fusion with allograft and plating. Postoperatively, the patient's neck and upper extremity symptoms were much improved.  The patient was slowly progressing postoperatively when began to have difficulties with fever.  Workup demonstrated evidence of a presumed mild aspiration pneumonia involving his right lung.  He has been started on piperacillin and will be transitioned over to Avelox for home discharge.  Currently, the patient is  afebrile.  He is having no respiratory distress and appears to be progressing well.  He has also had difficulty with a gout flare while as an inpatient.  He then developed severe foot and ankle pain.  This was then relieved with colchicine and a brief steroid burst.  His cervical spine wound is healing well.  There is no evidence of infection.  His overall pain level was good.  His motor and sensory function of the upper extremities and his remaining lower extremity has returned to very close to baseline.  The patient is going to be discharged to a skilled nursing facility for further convalescence.  He may be mobilized as tolerated. He is to remain in a soft cervical brace.  He is to refrain from lifting more than 10 pounds.  He should continue with active physical therapy. Plan is to continue with oral antibiotics for 2 weeks to complete his treatment of his aspiration pneumonia.  He should also begin a steroid dose taper for his recent gout flare.  DISCHARGE MEDICATIONS: 1. Norvasc 10 mg p.o. daily. 2. Medrol Dosepak. 3. Lexapro 10 mg p.o. daily. 4. Hydrochlorothiazide 25 mg p.o. daily. 5. Gleevec 200 mg p.o. daily. 6. Lopressor 37.5 mg p.o. b.i.d. 7. Benicar 40 mg  p.o. daily. 8. Percocet 5 mg p.o. q.6 h. p.r.n. 9. Protonix 40 mg p.o. daily. 10.Potassium 20 mEq p.o. daily. 11.Crestor 10 mg p.o. daily. 12.Colchicine 0.6 mg p.o. q.4 h. p.r.n. 13.Flexeril 10 mg p.o. q.8 h. p.r.n. 14.Ambien 5 mg p.o. at bedtime p.r.n.  WOUND CARE:  The patient is to keep his anterior cervical wound clean and dry.  FOLLOWUP:  The patient should follow up with me in my office in 2 weeks.          ______________________________ Cooper Render [name], M.D.     HAP/MEDQ  D:  10/07/2010  T:  10/07/2010  Job:  [id number]  Electronically Signed by Earnie Larsson M.D. on 10/07/2010 01:14:53 PM

## 2010-10-07 NOTE — Op Note (Signed)
[name], [name]                ACCOUNT NO.:  [id number]  MEDICAL RECORD NO.:  [id number]           PATIENT TYPE:  LOCATION:                                 FACILITY:  PHYSICIAN:  Cooper Render. Mindie Rawdon, M.D.    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  PROCEDURE NOTE:  C3-4, C4-5 anterior cervical diskectomy and fusion with allograft and plating.  SURGEON:  Cooper Render. Kameron Blethen, MD  ASSISTANT:  Eustace Moore, MD  ANESTHESIA:  General endotracheal.  INDICATIONS:  [name] is a 65 year old male status post a recent fall with a mild cervical central spinal cord injury.  Workup demonstrates evidence of marked stenosis at C3-4 and C4-5.  The patient presents now for two-level anterior cervical decompression in hopes of improving his symptoms.  OPERATING NOTE:  The patient was taken to the operating room and placed on operating table in supine position.  Adequate level of anesthesia was achieved, the patient was supine with neck slightly extended and held in place with halter traction.  The patient's anterior cervical region was prepped and draped sterilely.  A #10 blade was used to make a linear skin incision overlying the C4 vertebral level.  This was carried down sharply to the platysma.  The platysma was then divided vertically and the dissection was proceeded along the medial border of the sternomastoid muscle and carotid sheath.  Trachea and esophagus were mobilized and tracked towards the left.  Prevertebral fascia was stripped off the anterior spinal column.  Longus colli muscles elevated bilaterally using electrocautery.  Deep self-retaining retractors were placed.  Intraoperative fluoroscopy was used and levels were confirmed. Disk spaces at C3-4 and C4-5 were then  incised with 15 blade in a rectangular fashion to widen disk space, clean-out was achieved using pituitary rongeurs forward and backward Karlin curettes, Kerrison rongeurs, high-speed drill.  The elements of the disk were removed down to the posterior annulus.  Microscope was then brought into the field and used throughout the remainder of the diskectomy.  The remaining aspects of the annulus and osteophytes were removed using high-speed drill down to the level of the posterior longitudinal ligament.  The posterior longitudinal ligament was then elevated and resected in a fashion using Kerrison rongeurs.  Underlying thecal sac was then identified.  Wide central decompression was then performed undercutting the bodies of C3 and C4.  Decompression was then proceeded out at each neural foramen.  Wide anterior foraminotomies were then performed along the course of the exiting C4 nerve roots bilaterally.  At this point, a very thorough decompression was achieved.  There was no evidence of injury to thecal sac or nerve roots.  Procedure was then repeated at C4- 5 again without complication.  Wound was then irrigated with antibiotic solution.  Gelfoam was placed topically for hemostasis which was found to be good.  Cornerstone allograft wedge was then packed into place and recessed approximately 1 mm  from the anterior cortical margin of C3-4 and C4-5.  A 45-mm Atlantis anterior cervical plate was then placed over the C3, C4, and C5 levels.  This was then attached under fluoroscopic guidance using 13-mm variable screws to each at L3 levels.  All six screws were given final tightening and found to be solid within bone. Locking screws were engaged at all three levels.  Final images revealed good position of bone grafts, hardware at proper operative level, normal alignment of spine.  Wound was then irrigated one final time. Hemostasis was ensured with bipolar electrocautery.  Wound was then closed  in layers with sutures.  There were no complications.  The patient tolerated the procedure well, and he returned to the recovery room postop.          ______________________________ Cooper Render. Delmon Andrada, M.D.     HAP/MEDQ  D:  09/28/2010  T:  09/29/2010  Job:  [id number]  Electronically Signed by Earnie Larsson M.D. on 10/07/2010 01:14:50 PM

## 2010-10-08 LAB — CULTURE, BLOOD (ROUTINE X 2): Culture: NO GROWTH

## 2010-10-21 ENCOUNTER — Other Ambulatory Visit: Payer: Self-pay | Admitting: Oncology

## 2010-10-21 ENCOUNTER — Encounter (HOSPITAL_BASED_OUTPATIENT_CLINIC_OR_DEPARTMENT_OTHER): Payer: Medicare Other | Admitting: Oncology

## 2010-10-21 DIAGNOSIS — C9210 Chronic myeloid leukemia, BCR/ABL-positive, not having achieved remission: Secondary | ICD-10-CM

## 2010-10-21 LAB — COMPREHENSIVE METABOLIC PANEL
Albumin: 2.8 g/dL — ABNORMAL LOW (ref 3.5–5.2)
CO2: 30 mEq/L (ref 19–32)
Calcium: 9.7 mg/dL (ref 8.4–10.5)
Chloride: 101 mEq/L (ref 96–112)
Glucose, Bld: 91 mg/dL (ref 70–99)
Sodium: 141 mEq/L (ref 135–145)
Total Bilirubin: 0.3 mg/dL (ref 0.3–1.2)
Total Protein: 6.9 g/dL (ref 6.0–8.3)

## 2010-10-21 LAB — CBC WITH DIFFERENTIAL/PLATELET
Eosinophils Absolute: 0.2 10*3/uL (ref 0.0–0.5)
HCT: 33.3 % — ABNORMAL LOW (ref 38.4–49.9)
LYMPH%: 16.1 % (ref 14.0–49.0)
MONO#: 0.5 10*3/uL (ref 0.1–0.9)
NEUT#: 3.3 10*3/uL (ref 1.5–6.5)
Platelets: 372 10*3/uL (ref 140–400)
RBC: 4.12 10*6/uL — ABNORMAL LOW (ref 4.20–5.82)
WBC: 4.8 10*3/uL (ref 4.0–10.3)
lymph#: 0.8 10*3/uL — ABNORMAL LOW (ref 0.9–3.3)

## 2010-10-22 ENCOUNTER — Other Ambulatory Visit (HOSPITAL_COMMUNITY): Payer: Self-pay | Admitting: Internal Medicine

## 2010-10-22 ENCOUNTER — Other Ambulatory Visit (HOSPITAL_COMMUNITY): Payer: Self-pay | Admitting: Emergency Medicine

## 2010-10-22 ENCOUNTER — Other Ambulatory Visit (HOSPITAL_COMMUNITY): Payer: Self-pay | Admitting: Neurosurgery

## 2010-10-22 DIAGNOSIS — W19XXXA Unspecified fall, initial encounter: Secondary | ICD-10-CM

## 2010-10-22 DIAGNOSIS — S0990XA Unspecified injury of head, initial encounter: Secondary | ICD-10-CM

## 2010-10-22 DIAGNOSIS — M542 Cervicalgia: Secondary | ICD-10-CM

## 2010-10-22 DIAGNOSIS — R52 Pain, unspecified: Secondary | ICD-10-CM

## 2010-10-24 ENCOUNTER — Ambulatory Visit (HOSPITAL_COMMUNITY): Payer: Medicare Other

## 2010-10-24 ENCOUNTER — Ambulatory Visit (HOSPITAL_COMMUNITY): Admission: RE | Admit: 2010-10-24 | Payer: Medicare Other | Source: Ambulatory Visit

## 2010-10-25 ENCOUNTER — Other Ambulatory Visit (HOSPITAL_COMMUNITY): Payer: Self-pay | Admitting: Emergency Medicine

## 2010-10-25 DIAGNOSIS — R52 Pain, unspecified: Secondary | ICD-10-CM

## 2010-10-25 DIAGNOSIS — S098XXA Other specified injuries of head, initial encounter: Secondary | ICD-10-CM

## 2010-10-27 ENCOUNTER — Emergency Department (HOSPITAL_COMMUNITY)
Admission: EM | Admit: 2010-10-27 | Discharge: 2010-10-27 | Disposition: A | Discharge status: Home / Self Care | Payer: Medicare Other | Attending: Emergency Medicine | Admitting: Emergency Medicine

## 2010-10-27 ENCOUNTER — Emergency Department (HOSPITAL_COMMUNITY): Payer: Medicare Other

## 2010-10-27 DIAGNOSIS — I10 Essential (primary) hypertension: Secondary | ICD-10-CM | POA: Insufficient documentation

## 2010-10-27 DIAGNOSIS — Z8673 Personal history of transient ischemic attack (TIA), and cerebral infarction without residual deficits: Secondary | ICD-10-CM | POA: Insufficient documentation

## 2010-10-27 DIAGNOSIS — R109 Unspecified abdominal pain: Secondary | ICD-10-CM | POA: Insufficient documentation

## 2010-10-27 DIAGNOSIS — R5381 Other malaise: Secondary | ICD-10-CM | POA: Insufficient documentation

## 2010-10-27 DIAGNOSIS — R64 Cachexia: Secondary | ICD-10-CM | POA: Insufficient documentation

## 2010-10-27 DIAGNOSIS — R079 Chest pain, unspecified: Secondary | ICD-10-CM | POA: Insufficient documentation

## 2010-10-27 DIAGNOSIS — R112 Nausea with vomiting, unspecified: Secondary | ICD-10-CM | POA: Insufficient documentation

## 2010-10-27 LAB — LIPASE, BLOOD: Lipase: 29 U/L (ref 11–59)

## 2010-10-27 LAB — COMPREHENSIVE METABOLIC PANEL
AST: 50 U/L — ABNORMAL HIGH (ref 0–37)
BUN: 20 mg/dL (ref 6–23)
CO2: 25 mEq/L (ref 19–32)
Calcium: 9.7 mg/dL (ref 8.4–10.5)
Chloride: 97 mEq/L (ref 96–112)
Creatinine, Ser: 1.45 mg/dL — ABNORMAL HIGH (ref 0.50–1.35)
GFR calc Af Amer: 59 mL/min — ABNORMAL LOW (ref >60)
GFR calc non Af Amer: 49 mL/min — ABNORMAL LOW (ref >60)
Total Bilirubin: 0.4 mg/dL (ref 0.3–1.2)

## 2010-10-27 LAB — URINALYSIS, ROUTINE W REFLEX MICROSCOPIC
Bilirubin Urine: NEGATIVE
Hgb urine dipstick: NEGATIVE
Ketones, ur: NEGATIVE mg/dL
Nitrite: NEGATIVE
Protein, ur: NEGATIVE mg/dL
Specific Gravity, Urine: 1.018 (ref 1.005–1.030)
Urobilinogen, UA: 0.2 mg/dL (ref 0.0–1.0)

## 2010-10-27 LAB — CBC
HCT: 35.6 % — ABNORMAL LOW (ref 39.0–52.0)
MCH: 24.2 pg — ABNORMAL LOW (ref 26.0–34.0)
MCV: 77.1 fL — ABNORMAL LOW (ref 78.0–100.0)
Platelets: 187 10*3/uL (ref 150–400)
RBC: 4.62 MIL/uL (ref 4.22–5.81)

## 2010-10-27 LAB — SAMPLE TO BLOOD BANK

## 2010-10-27 LAB — DIFFERENTIAL
Eosinophils Absolute: 0.1 10*3/uL (ref 0.0–0.7)
Eosinophils Relative: 2 % (ref 0–5)
Lymphocytes Relative: 15 % (ref 12–46)
Lymphs Abs: 0.7 10*3/uL (ref 0.7–4.0)
Monocytes Relative: 11 % (ref 3–12)
Neutrophils Relative %: 72 % (ref 43–77)

## 2010-10-28 LAB — URINE CULTURE

## 2010-11-11 ENCOUNTER — Encounter (HOSPITAL_BASED_OUTPATIENT_CLINIC_OR_DEPARTMENT_OTHER): Payer: Medicare Other | Admitting: Oncology

## 2010-11-11 ENCOUNTER — Other Ambulatory Visit: Payer: Self-pay | Admitting: Oncology

## 2010-11-11 DIAGNOSIS — K861 Other chronic pancreatitis: Secondary | ICD-10-CM

## 2010-11-11 DIAGNOSIS — F329 Major depressive disorder, single episode, unspecified: Secondary | ICD-10-CM

## 2010-11-11 DIAGNOSIS — C9210 Chronic myeloid leukemia, BCR/ABL-positive, not having achieved remission: Secondary | ICD-10-CM

## 2010-11-11 DIAGNOSIS — R197 Diarrhea, unspecified: Secondary | ICD-10-CM

## 2010-11-11 LAB — CBC WITH DIFFERENTIAL/PLATELET
BASO%: 0.4 % (ref 0.0–2.0)
Eosinophils Absolute: 0.4 10*3/uL (ref 0.0–0.5)
MONO#: 0.5 10*3/uL (ref 0.1–0.9)
NEUT#: 3.9 10*3/uL (ref 1.5–6.5)
RBC: 3.84 10*6/uL — ABNORMAL LOW (ref 4.20–5.82)
RDW: 19.1 % — ABNORMAL HIGH (ref 11.0–14.6)
WBC: 6.1 10*3/uL (ref 4.0–10.3)
lymph#: 1.3 10*3/uL (ref 0.9–3.3)

## 2010-11-11 LAB — BASIC METABOLIC PANEL
CO2: 23 mEq/L (ref 19–32)
Glucose, Bld: 118 mg/dL — ABNORMAL HIGH (ref 70–99)
Potassium: 3.9 mEq/L (ref 3.5–5.3)
Sodium: 140 mEq/L (ref 135–145)

## 2010-11-17 LAB — BCR/ABL (LIO MMD)

## 2010-12-09 ENCOUNTER — Other Ambulatory Visit: Payer: Self-pay | Admitting: Oncology

## 2010-12-09 ENCOUNTER — Encounter (HOSPITAL_BASED_OUTPATIENT_CLINIC_OR_DEPARTMENT_OTHER): Payer: Medicare Other | Admitting: Oncology

## 2010-12-09 DIAGNOSIS — K861 Other chronic pancreatitis: Secondary | ICD-10-CM

## 2010-12-09 DIAGNOSIS — C9210 Chronic myeloid leukemia, BCR/ABL-positive, not having achieved remission: Secondary | ICD-10-CM

## 2010-12-09 DIAGNOSIS — F329 Major depressive disorder, single episode, unspecified: Secondary | ICD-10-CM

## 2010-12-09 DIAGNOSIS — R197 Diarrhea, unspecified: Secondary | ICD-10-CM

## 2010-12-09 LAB — CBC WITH DIFFERENTIAL/PLATELET
BASO%: 0.4 % (ref 0.0–2.0)
LYMPH%: 20.9 % (ref 14.0–49.0)
MCHC: 32.3 g/dL (ref 32.0–36.0)
MONO#: 0.3 10*3/uL (ref 0.1–0.9)
Platelets: 159 10*3/uL (ref 140–400)
RBC: 4.09 10*6/uL — ABNORMAL LOW (ref 4.20–5.82)
WBC: 5.1 10*3/uL (ref 4.0–10.3)
lymph#: 1.1 10*3/uL (ref 0.9–3.3)

## 2010-12-09 LAB — BASIC METABOLIC PANEL
BUN: 14 mg/dL (ref 6–23)
Chloride: 104 mEq/L (ref 96–112)
Creatinine, Ser: 0.99 mg/dL (ref 0.50–1.35)
Glucose, Bld: 106 mg/dL — ABNORMAL HIGH (ref 70–99)
Sodium: 144 mEq/L (ref 135–145)

## 2010-12-16 ENCOUNTER — Other Ambulatory Visit: Payer: Self-pay | Admitting: Oncology

## 2010-12-16 ENCOUNTER — Encounter (HOSPITAL_BASED_OUTPATIENT_CLINIC_OR_DEPARTMENT_OTHER): Payer: Medicare Other | Admitting: Oncology

## 2010-12-16 DIAGNOSIS — F3289 Other specified depressive episodes: Secondary | ICD-10-CM

## 2010-12-16 DIAGNOSIS — F329 Major depressive disorder, single episode, unspecified: Secondary | ICD-10-CM

## 2010-12-16 DIAGNOSIS — C9210 Chronic myeloid leukemia, BCR/ABL-positive, not having achieved remission: Secondary | ICD-10-CM

## 2010-12-16 DIAGNOSIS — R197 Diarrhea, unspecified: Secondary | ICD-10-CM

## 2010-12-16 DIAGNOSIS — K861 Other chronic pancreatitis: Secondary | ICD-10-CM

## 2010-12-16 LAB — BASIC METABOLIC PANEL
BUN: 9 mg/dL (ref 6–23)
Calcium: 8.9 mg/dL (ref 8.4–10.5)
Glucose, Bld: 84 mg/dL (ref 70–99)
Sodium: 142 mEq/L (ref 135–145)

## 2010-12-24 ENCOUNTER — Encounter (HOSPITAL_BASED_OUTPATIENT_CLINIC_OR_DEPARTMENT_OTHER): Payer: Medicare Other | Admitting: Oncology

## 2010-12-24 ENCOUNTER — Other Ambulatory Visit: Payer: Self-pay | Admitting: Oncology

## 2010-12-24 DIAGNOSIS — C9210 Chronic myeloid leukemia, BCR/ABL-positive, not having achieved remission: Secondary | ICD-10-CM

## 2010-12-24 DIAGNOSIS — K861 Other chronic pancreatitis: Secondary | ICD-10-CM

## 2010-12-24 DIAGNOSIS — F329 Major depressive disorder, single episode, unspecified: Secondary | ICD-10-CM

## 2010-12-24 DIAGNOSIS — R197 Diarrhea, unspecified: Secondary | ICD-10-CM

## 2010-12-24 LAB — BASIC METABOLIC PANEL
Calcium: 9.4 mg/dL (ref 8.4–10.5)
Potassium: 3.6 mEq/L (ref 3.5–5.3)
Sodium: 143 mEq/L (ref 135–145)

## 2011-01-28 ENCOUNTER — Emergency Department (HOSPITAL_COMMUNITY)
Admission: EM | Admit: 2011-01-28 | Discharge: 2011-01-29 | Disposition: A | Discharge status: Home / Self Care | Payer: Medicare Other | Attending: Emergency Medicine | Admitting: Emergency Medicine

## 2011-01-28 ENCOUNTER — Emergency Department (HOSPITAL_COMMUNITY): Payer: Medicare Other

## 2011-01-28 DIAGNOSIS — I10 Essential (primary) hypertension: Secondary | ICD-10-CM | POA: Insufficient documentation

## 2011-01-28 DIAGNOSIS — Z79899 Other long term (current) drug therapy: Secondary | ICD-10-CM | POA: Insufficient documentation

## 2011-01-28 DIAGNOSIS — M109 Gout, unspecified: Secondary | ICD-10-CM | POA: Insufficient documentation

## 2011-01-28 DIAGNOSIS — E7800 Pure hypercholesterolemia, unspecified: Secondary | ICD-10-CM | POA: Insufficient documentation

## 2011-01-28 DIAGNOSIS — S78119A Complete traumatic amputation at level between unspecified hip and knee, initial encounter: Secondary | ICD-10-CM | POA: Insufficient documentation

## 2011-01-28 DIAGNOSIS — M79609 Pain in unspecified limb: Secondary | ICD-10-CM | POA: Insufficient documentation

## 2011-01-28 LAB — COMPREHENSIVE METABOLIC PANEL
AST: 34
Albumin: 3.1 — ABNORMAL LOW
CO2: 30
Calcium: 9.3
Chloride: 103
Creatinine, Ser: 1
Creatinine, Ser: 1.15
GFR calc Af Amer: >60
GFR calc non Af Amer: >60
Glucose, Bld: 121 — ABNORMAL HIGH
Potassium: 3.1 — ABNORMAL LOW
Total Bilirubin: 0.8

## 2011-01-28 LAB — URINE MICROSCOPIC-ADD ON

## 2011-01-28 LAB — CBC
HCT: 25 — ABNORMAL LOW
HCT: 27.8 — ABNORMAL LOW
HCT: 32 — ABNORMAL LOW
MCV: 108.8 — ABNORMAL HIGH
MCV: 80.1
MCV: 82.1
Platelets: 212
Platelets: 229
RBC: 2.69 — ABNORMAL LOW
RBC: 3.47 — ABNORMAL LOW
RBC: 3.49 — ABNORMAL LOW
WBC: 208.5
WBC: 229.4
WBC: 231.3

## 2011-01-28 LAB — BASIC METABOLIC PANEL
CO2: 25
CO2: 28
CO2: 29
Chloride: 102
Chloride: 102
Chloride: 109
Chloride: 98
Creatinine, Ser: 1.15
Creatinine, Ser: 1.21
GFR calc Af Amer: >60
GFR calc Af Amer: >60
GFR calc Af Amer: >60
Glucose, Bld: 85
Potassium: 3 — ABNORMAL LOW
Potassium: 3.3 — ABNORMAL LOW
Potassium: 3.9
Potassium: 4
Sodium: 137
Sodium: 141

## 2011-01-28 LAB — DIFFERENTIAL
Band Neutrophils: 18 — ABNORMAL HIGH
Basophils Relative: 0
Basophils Relative: 1
Blasts: 1
Lymphocytes Relative: 2 — ABNORMAL LOW
Metamyelocytes Relative: 11
Neutrophils Relative %: 36 — ABNORMAL LOW
Promyelocytes Absolute: 0
Promyelocytes Absolute: 1
Smear Review: 32

## 2011-01-28 LAB — ANAEROBIC CULTURE

## 2011-01-28 LAB — URINALYSIS, ROUTINE W REFLEX MICROSCOPIC
Bilirubin Urine: NEGATIVE
Glucose, UA: NEGATIVE mg/dL
Hgb urine dipstick: NEGATIVE
Ketones, ur: NEGATIVE mg/dL
Leukocytes, UA: NEGATIVE
Nitrite: NEGATIVE
Nitrite: NEGATIVE
Specific Gravity, Urine: 1.015
Specific Gravity, Urine: 1.012 (ref 1.005–1.030)
Urobilinogen, UA: 0.2
pH: 6.5 (ref 5.0–8.0)

## 2011-01-28 LAB — URIC ACID: Uric Acid, Serum: 8.3 — ABNORMAL HIGH

## 2011-01-28 LAB — SAVE SMEAR

## 2011-01-28 LAB — CHROMOSOME ANALYSIS, PERIPHERAL BLOOD
Band level: 450
Interpretation (chromo): ABNORMAL

## 2011-01-28 LAB — BODY FLUID CULTURE: Culture: NO GROWTH

## 2011-01-28 LAB — HEPATIC FUNCTION PANEL
ALT: 31
Albumin: 3.8
Alkaline Phosphatase: 147 — ABNORMAL HIGH
Total Protein: 7.9

## 2011-01-28 LAB — SYNOVIAL CELL COUNT + DIFF, W/ CRYSTALS
Lymphocytes-Synovial Fld: 2
Neutrophil, Synovial: 85 — ABNORMAL HIGH

## 2011-01-28 LAB — SEDIMENTATION RATE: Sed Rate: 46 — ABNORMAL HIGH

## 2011-01-28 LAB — POCT HEMOGLOBIN-HEMACUE: Hemoglobin: 10.9 — ABNORMAL LOW

## 2011-02-04 LAB — CBC
HCT: 33.1 — ABNORMAL LOW
MCHC: 33.1
Platelets: 125 — ABNORMAL LOW
Platelets: 173
RDW: 18.4 — ABNORMAL HIGH
RDW: 18.9 — ABNORMAL HIGH
WBC: 7.9

## 2011-02-04 LAB — BASIC METABOLIC PANEL
BUN: 14
BUN: 14
BUN: 16
CO2: 30
Calcium: 8.5
Calcium: 8.8
Calcium: 9.3
Chloride: 112
Creatinine, Ser: 0.98
Creatinine, Ser: 1.09
GFR calc Af Amer: >60
GFR calc non Af Amer: 59 — ABNORMAL LOW
GFR calc non Af Amer: >60
Glucose, Bld: 114 — ABNORMAL HIGH
Glucose, Bld: 130 — ABNORMAL HIGH
Potassium: 2.3 — CL
Sodium: 143

## 2011-02-04 LAB — CARDIAC PANEL(CRET KIN+CKTOT+MB+TROPI)
Relative Index: 0.5
Relative Index: 0.5
Total CK: 236 — ABNORMAL HIGH
Total CK: 261 — ABNORMAL HIGH
Troponin I: 0.01
Troponin I: 0.03

## 2011-02-04 LAB — MAGNESIUM: Magnesium: 2.1

## 2011-02-04 LAB — POCT CARDIAC MARKERS
CKMB, poc: 1.6
Myoglobin, poc: 85
Troponin i, poc: <0.05

## 2011-02-28 ENCOUNTER — Other Ambulatory Visit: Payer: Self-pay | Admitting: Oncology

## 2011-02-28 ENCOUNTER — Encounter (HOSPITAL_BASED_OUTPATIENT_CLINIC_OR_DEPARTMENT_OTHER): Payer: Medicare Other | Admitting: Oncology

## 2011-02-28 DIAGNOSIS — F329 Major depressive disorder, single episode, unspecified: Secondary | ICD-10-CM

## 2011-02-28 DIAGNOSIS — K861 Other chronic pancreatitis: Secondary | ICD-10-CM

## 2011-02-28 DIAGNOSIS — C9210 Chronic myeloid leukemia, BCR/ABL-positive, not having achieved remission: Secondary | ICD-10-CM

## 2011-02-28 DIAGNOSIS — D649 Anemia, unspecified: Secondary | ICD-10-CM

## 2011-02-28 DIAGNOSIS — R197 Diarrhea, unspecified: Secondary | ICD-10-CM

## 2011-02-28 LAB — CBC WITH DIFFERENTIAL/PLATELET
BASO%: 1 % (ref 0.0–2.0)
Eosinophils Absolute: 0.4 10*3/uL (ref 0.0–0.5)
LYMPH%: 32.2 % (ref 14.0–49.0)
MCHC: 32.8 g/dL (ref 32.0–36.0)
MCV: 78.3 fL — ABNORMAL LOW (ref 79.3–98.0)
MONO#: 0.3 10*3/uL (ref 0.1–0.9)
MONO%: 7.2 % (ref 0.0–14.0)
NEUT#: 1.8 10*3/uL (ref 1.5–6.5)
Platelets: 159 10*3/uL (ref 140–400)
RBC: 4.61 10*6/uL (ref 4.20–5.82)
RDW: 18.8 % — ABNORMAL HIGH (ref 11.0–14.6)
WBC: 3.7 10*3/uL — ABNORMAL LOW (ref 4.0–10.3)

## 2011-02-28 LAB — BASIC METABOLIC PANEL WITH GFR
BUN: 9 mg/dL (ref 6–23)
CO2: 28 meq/L (ref 19–32)
Calcium: 9.7 mg/dL (ref 8.4–10.5)
Chloride: 104 meq/L (ref 96–112)
Creatinine, Ser: 1.11 mg/dL (ref 0.50–1.35)
Glucose, Bld: 95 mg/dL (ref 70–99)
Potassium: 3.8 meq/L (ref 3.5–5.3)
Sodium: 143 meq/L (ref 135–145)

## 2011-03-03 LAB — BCR/ABL (LIO MMD)

## 2011-03-15 ENCOUNTER — Telehealth: Payer: Self-pay | Admitting: *Deleted

## 2011-03-15 ENCOUNTER — Other Ambulatory Visit: Payer: Self-pay | Admitting: *Deleted

## 2011-03-15 NOTE — Telephone Encounter (Signed)
REFILL COMPLETED. 

## 2011-04-04 ENCOUNTER — Ambulatory Visit: Payer: Medicare Other | Admitting: Nurse Practitioner

## 2011-04-04 ENCOUNTER — Other Ambulatory Visit: Payer: Medicare Other | Admitting: Lab

## 2011-04-05 ENCOUNTER — Telehealth: Payer: Self-pay | Admitting: Oncology

## 2011-04-05 NOTE — Telephone Encounter (Signed)
S/w the pt and he is aware of his r/s appts on 04/14/2011

## 2011-04-12 ENCOUNTER — Other Ambulatory Visit: Payer: Self-pay | Admitting: *Deleted

## 2011-04-12 MED ORDER — PROCHLORPERAZINE MALEATE 5 MG PO TABS: ORAL_TABLET | ORAL | Status: DC

## 2011-04-13 ENCOUNTER — Other Ambulatory Visit: Payer: Medicare Other | Admitting: Lab

## 2011-04-13 ENCOUNTER — Ambulatory Visit: Payer: Medicare Other | Admitting: Nurse Practitioner

## 2011-04-14 ENCOUNTER — Ambulatory Visit (HOSPITAL_BASED_OUTPATIENT_CLINIC_OR_DEPARTMENT_OTHER): Payer: Medicare Other | Admitting: Nurse Practitioner

## 2011-04-14 ENCOUNTER — Other Ambulatory Visit: Payer: Self-pay | Admitting: Oncology

## 2011-04-14 ENCOUNTER — Other Ambulatory Visit (HOSPITAL_BASED_OUTPATIENT_CLINIC_OR_DEPARTMENT_OTHER): Payer: Medicare Other | Admitting: Lab

## 2011-04-14 ENCOUNTER — Telehealth: Payer: Self-pay | Admitting: Oncology

## 2011-04-14 VITALS — BP 179/98 | HR 68 | Temp 97.7°F | Ht 69.5 in | Wt 156.8 lb

## 2011-04-14 DIAGNOSIS — C9210 Chronic myeloid leukemia, BCR/ABL-positive, not having achieved remission: Secondary | ICD-10-CM

## 2011-04-14 DIAGNOSIS — F329 Major depressive disorder, single episode, unspecified: Secondary | ICD-10-CM

## 2011-04-14 DIAGNOSIS — M25529 Pain in unspecified elbow: Secondary | ICD-10-CM

## 2011-04-14 DIAGNOSIS — R197 Diarrhea, unspecified: Secondary | ICD-10-CM

## 2011-04-14 DIAGNOSIS — C9211 Chronic myeloid leukemia, BCR/ABL-positive, in remission: Secondary | ICD-10-CM

## 2011-04-14 DIAGNOSIS — I10 Essential (primary) hypertension: Secondary | ICD-10-CM

## 2011-04-14 DIAGNOSIS — Z8679 Personal history of other diseases of the circulatory system: Secondary | ICD-10-CM

## 2011-04-14 DIAGNOSIS — K861 Other chronic pancreatitis: Secondary | ICD-10-CM

## 2011-04-14 DIAGNOSIS — R718 Other abnormality of red blood cells: Secondary | ICD-10-CM

## 2011-04-14 LAB — COMPREHENSIVE METABOLIC PANEL
AST: 20 U/L (ref 0–37)
Albumin: 3.8 g/dL (ref 3.5–5.2)
BUN: 12 mg/dL (ref 6–23)
CO2: 30 mEq/L (ref 19–32)
Calcium: 9 mg/dL (ref 8.4–10.5)
Chloride: 104 mEq/L (ref 96–112)
Glucose, Bld: 86 mg/dL (ref 70–99)
Potassium: 3.6 mEq/L (ref 3.5–5.3)

## 2011-04-14 LAB — CBC WITH DIFFERENTIAL/PLATELET
Basophils Absolute: 0 10*3/uL (ref 0.0–0.1)
EOS%: 8.4 % — ABNORMAL HIGH (ref 0.0–7.0)
Eosinophils Absolute: 0.3 10*3/uL (ref 0.0–0.5)
HCT: 35 % — ABNORMAL LOW (ref 38.4–49.9)
HGB: 11.2 g/dL — ABNORMAL LOW (ref 13.0–17.1)
MONO#: 0.4 10*3/uL (ref 0.1–0.9)
NEUT#: 1.9 10*3/uL (ref 1.5–6.5)
NEUT%: 49.2 % (ref 39.0–75.0)
RDW: 19.4 % — ABNORMAL HIGH (ref 11.0–14.6)
WBC: 3.9 10*3/uL — ABNORMAL LOW (ref 4.0–10.3)
lymph#: 1.3 10*3/uL (ref 0.9–3.3)

## 2011-04-14 LAB — FERRITIN: Ferritin: 38 ng/mL (ref 22–322)

## 2011-04-14 NOTE — Telephone Encounter (Signed)
gve the pt his jan 2013 appt calendar along with the appt to see dr Rush Farmer

## 2011-04-14 NOTE — Progress Notes (Signed)
OFFICE PROGRESS NOTE  Interval history:  [name] is a 65 year old man with CML. He is maintained on Gleevec at a dose of 200 mg daily. He is seen today for routine followup.  [name] reports that he feels well overall. He continues to have intermittent diarrhea. He takes Imodium as needed. He has intermittent mild nausea. He takes Compazine as needed. No mouth sores. He had a recent rash at the neck and upper back. The rash has resolved. He denies edema. He reports pain at the left elbow.   Objective: Blood pressure 179/98, pulse 68, temperature 97.7 F (36.5 C), temperature source Oral, height 5' 9.5" (1.765 m), weight 156 lb 12.8 oz (71.124 kg).  Oropharynx is without thrush or ulceration. Lungs are clear. No wheezes or rales. Regular cardiac rhythm. Abdomen is soft and nontender. No organomegaly. Left leg is without edema. 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   Lab Results: Lab Results  Component Value Date   WBC 3.9* 04/14/2011   HGB 11.2* 04/14/2011   HCT 35.0* 04/14/2011   MCV 80.8 04/14/2011   PLT 155 04/14/2011    Chemistry:      Component Value Date/Time   NA 143 02/28/2011 1428   NA 143 02/28/2011 1428   K 3.8 02/28/2011 1428   K 3.8 02/28/2011 1428   CL 104 02/28/2011 1428   CL 104 02/28/2011 1428   CO2 28 02/28/2011 1428   CO2 28 02/28/2011 1428   GLUCOSE 95 02/28/2011 1428   GLUCOSE 95 02/28/2011 1428   BUN 9 02/28/2011 1428   BUN 9 02/28/2011 1428   CREATININE 1.11 02/28/2011 1428   CREATININE 1.11 02/28/2011 1428   CALCIUM 9.7 02/28/2011 1428   CALCIUM 9.7 02/28/2011 1428   PROT 6.6 10/27/2010 1911   ALBUMIN 3.1* 10/27/2010 1911   AST 50* 10/27/2010 1911   ALT 56* 10/27/2010 1911   ALKPHOS 152* 10/27/2010 1911   BILITOT 0.4 10/27/2010 1911   GFRNONAA 49* 10/27/2010 1911   GFRAA 59* 10/27/2010 1911     Studies/Results: No results found.  Medications: I have reviewed the patient's current medications.  Assessment/Plan:  1. Chronic myelogenous  leukemia.  He remains in hematologic remission.  He is taking Gleevec at a dose of 200 mg daily. 2. 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 3. Status post C3-C4, C4-C5, anterior cervical diskectomy and fusion with allograft and plating 09/30/2010. 4. 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 5. Depression, maintained on Lexapro. 6. Diarrhea, ? related to chronic pancreatitis versus Gleevec.  He continues Imodium as needed. 7. Indurated facial skin lesion 11/20/2007 with a history of MRSA skin infection of the submental area in May 2008.  The induration resolved with doxycycline. 8. Left knee arthroscopy 05/25/2007. 9. Postoperative left knee effusion/pain, likely related to gout. 10. History of gout.  He continues colchicine and allopurinol. 11. Chronic pancreatitis. 12. Status post right above-the-knee amputation. 13. MRSA infection of the submental area May 2008. 14. History of tobacco, alcohol, and cocaine use. 15. History of coronary artery disease. 16. "Shotty" lymphadenopathy of the neck, axilla, and left groin in 2009.   17. History of a microcytic anemia.  He continues to have red cell microcytosis. 18. History of a right olecranon bursa lesion, ? gouty tophus. 19. 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  He previously took AndroGel. 20. 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 21. 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  He requests a referral to orthopedics for evaluation.  [name] appears stable. He continues [place]. We will followup on the peripheral blood PCR from today. He will return for a followup visit in 6 weeks. He will contact the office in the interim with any problems.  Plan reviewed with Dr. [name].    [name] ANP/GNP-BC

## 2011-04-27 ENCOUNTER — Telehealth: Payer: Self-pay | Admitting: *Deleted

## 2011-04-27 NOTE — Telephone Encounter (Signed)
Per Dr. [name]: BCR/ABL is higher. Is he taking Gleevec? Repeat in 1 month and if still high will need to switch drugs.  Left VM for patient to call office to discuss results with nurse.

## 2011-04-29 NOTE — Telephone Encounter (Signed)
Patient reports he is taking Gleevec 200 mg daily, but does tend to miss "a day or two each week". Made him aware of results and follow up appointment on 05/26/11. He reports that ortho surgery on his left knee is planned again for some time in January per a Dr. Ninfa Linden. Instructed him to call office with surgery date as soon as he is aware and to be sure surgeon knows he his on Hydrea.

## 2011-05-04 ENCOUNTER — Telehealth: Payer: Self-pay | Admitting: *Deleted

## 2011-05-04 ENCOUNTER — Other Ambulatory Visit: Payer: Self-pay | Admitting: *Deleted

## 2011-05-04 NOTE — Telephone Encounter (Signed)
Called pt to let him know that his BCR-ABL is higher & asked if he was taking his gleevec.  He reports diarrhea & misses a dose here & there but not usually more than a day.  He is taking imodium on a regular basis.  Encouraged to stay on gleevec as ordered & we will repeat BCR/ABL in 34mo & if this is still high, Dr [name] may have to switch drugs.  Will make sure lab is ordered for next lab & f/u 05/25/10.

## 2011-05-16 ENCOUNTER — Encounter: Payer: Self-pay | Admitting: Oncology

## 2011-05-19 ENCOUNTER — Other Ambulatory Visit: Payer: Self-pay | Admitting: Oncology

## 2011-05-23 ENCOUNTER — Other Ambulatory Visit (HOSPITAL_COMMUNITY): Payer: Self-pay | Admitting: Orthopaedic Surgery

## 2011-05-26 ENCOUNTER — Other Ambulatory Visit: Payer: Self-pay | Admitting: *Deleted

## 2011-05-26 ENCOUNTER — Other Ambulatory Visit (HOSPITAL_BASED_OUTPATIENT_CLINIC_OR_DEPARTMENT_OTHER): Payer: Medicare Other | Admitting: Lab

## 2011-05-26 ENCOUNTER — Ambulatory Visit (HOSPITAL_BASED_OUTPATIENT_CLINIC_OR_DEPARTMENT_OTHER): Payer: Medicare Other | Admitting: Oncology

## 2011-05-26 DIAGNOSIS — R110 Nausea: Secondary | ICD-10-CM

## 2011-05-26 DIAGNOSIS — R197 Diarrhea, unspecified: Secondary | ICD-10-CM

## 2011-05-26 DIAGNOSIS — C9210 Chronic myeloid leukemia, BCR/ABL-positive, not having achieved remission: Secondary | ICD-10-CM

## 2011-05-26 DIAGNOSIS — R630 Anorexia: Secondary | ICD-10-CM

## 2011-05-26 LAB — COMPREHENSIVE METABOLIC PANEL
Albumin: 3.9 g/dL (ref 3.5–5.2)
BUN: 12 mg/dL (ref 6–23)
CO2: 23 mEq/L (ref 19–32)
Calcium: 9.3 mg/dL (ref 8.4–10.5)
Glucose, Bld: 90 mg/dL (ref 70–99)
Potassium: 3.5 mEq/L (ref 3.5–5.3)
Sodium: 141 mEq/L (ref 135–145)
Total Protein: 6.4 g/dL (ref 6.0–8.3)

## 2011-05-26 LAB — CBC WITH DIFFERENTIAL/PLATELET
Basophils Absolute: 0 10*3/uL (ref 0.0–0.1)
Eosinophils Absolute: 0.4 10*3/uL (ref 0.0–0.5)
HGB: 11.7 g/dL — ABNORMAL LOW (ref 13.0–17.1)
MONO#: 0.3 10*3/uL (ref 0.1–0.9)
NEUT#: 3.5 10*3/uL (ref 1.5–6.5)
Platelets: 188 10*3/uL (ref 140–400)
RBC: 4.37 10*6/uL (ref 4.20–5.82)
RDW: 17.5 % — ABNORMAL HIGH (ref 11.0–14.6)
WBC: 5.7 10*3/uL (ref 4.0–10.3)

## 2011-05-26 MED ORDER — MEGESTROL ACETATE 625 MG/5ML PO SUSP: 625.0000 mg | Freq: Two times a day (BID) | ORAL | Status: DC

## 2011-05-26 MED ORDER — HYDROCODONE-ACETAMINOPHEN 7.5-500 MG PO TABS: 1.0000 | ORAL_TABLET | Freq: Four times a day (QID) | ORAL | Status: DC | PRN

## 2011-05-26 NOTE — Telephone Encounter (Signed)
Patient having knee surgery on 06/10/11 per Dr. Melodye Ped if Dr. [name] will order "a few pain pills" to get him through till then. MD approved #[id number] Lortab.
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
## 2011-05-27 ENCOUNTER — Telehealth: Payer: Self-pay | Admitting: Oncology

## 2011-05-27 NOTE — Telephone Encounter (Signed)
called pt and informed him of appt on 2-18

## 2011-05-30 ENCOUNTER — Encounter (HOSPITAL_COMMUNITY): Payer: Self-pay | Admitting: Pharmacy Technician

## 2011-05-31 LAB — BCR/ABL (LIO MMD)

## 2011-06-06 ENCOUNTER — Encounter (HOSPITAL_COMMUNITY)
Admission: RE | Admit: 2011-06-06 | Discharge: 2011-06-06 | Disposition: A | Discharge status: Home / Self Care | Payer: Medicare Other | Source: Ambulatory Visit | Attending: Orthopaedic Surgery | Admitting: Orthopaedic Surgery

## 2011-06-06 ENCOUNTER — Encounter (HOSPITAL_COMMUNITY): Payer: Self-pay

## 2011-06-06 DIAGNOSIS — S71139A Puncture wound without foreign body, unspecified thigh, initial encounter: Secondary | ICD-10-CM

## 2011-06-06 DIAGNOSIS — IMO0001 Reserved for inherently not codable concepts without codable children: Secondary | ICD-10-CM

## 2011-06-06 DIAGNOSIS — D759 Disease of blood and blood-forming organs, unspecified: Secondary | ICD-10-CM

## 2011-06-06 DIAGNOSIS — C801 Malignant (primary) neoplasm, unspecified: Secondary | ICD-10-CM

## 2011-06-06 DIAGNOSIS — M1990 Unspecified osteoarthritis, unspecified site: Secondary | ICD-10-CM

## 2011-06-06 DIAGNOSIS — M109 Gout, unspecified: Secondary | ICD-10-CM

## 2011-06-06 DIAGNOSIS — Z5189 Encounter for other specified aftercare: Secondary | ICD-10-CM

## 2011-06-06 HISTORY — DX: Puncture wound without foreign body, unspecified thigh, initial encounter: S71.139A

## 2011-06-06 HISTORY — DX: Disease of blood and blood-forming organs, unspecified: D75.9

## 2011-06-06 HISTORY — PX: CARDIAC CATHETERIZATION: SHX172

## 2011-06-06 HISTORY — DX: Essential (primary) hypertension: I10

## 2011-06-06 HISTORY — DX: Encounter for other specified aftercare: Z51.89

## 2011-06-06 HISTORY — DX: Unspecified osteoarthritis, unspecified site: M19.90

## 2011-06-06 HISTORY — PX: JOINT REPLACEMENT: SHX530

## 2011-06-06 HISTORY — DX: Accidental discharge from unspecified firearms or gun, initial encounter: W34.00XA

## 2011-06-06 HISTORY — DX: Gout, unspecified: M10.9

## 2011-06-06 HISTORY — PX: CORONARY ANGIOPLASTY: SHX604

## 2011-06-06 HISTORY — DX: Reserved for inherently not codable concepts without codable children: IMO0001

## 2011-06-06 HISTORY — DX: Malignant (primary) neoplasm, unspecified: C80.1

## 2011-06-06 LAB — CBC
MCHC: 32.6 g/dL (ref 30.0–36.0)
Platelets: 151 10*3/uL (ref 150–400)
RDW: 17.3 % — ABNORMAL HIGH (ref 11.5–15.5)
WBC: 6.5 10*3/uL (ref 4.0–10.5)

## 2011-06-06 LAB — BASIC METABOLIC PANEL
Calcium: 9.3 mg/dL (ref 8.4–10.5)
Creatinine, Ser: 0.92 mg/dL (ref 0.50–1.35)
GFR calc Af Amer: >90 mL/min (ref >90)
GFR calc non Af Amer: 87 mL/min — ABNORMAL LOW (ref >90)

## 2011-06-06 LAB — URINALYSIS, ROUTINE W REFLEX MICROSCOPIC
Nitrite: NEGATIVE
Protein, ur: 100 mg/dL — AB
Specific Gravity, Urine: 1.018 (ref 1.005–1.030)
Urobilinogen, UA: 0.2 mg/dL (ref 0.0–1.0)

## 2011-06-06 LAB — URINE MICROSCOPIC-ADD ON: Urine-Other: NONE SEEN

## 2011-06-06 LAB — SURGICAL PCR SCREEN
MRSA, PCR: NEGATIVE
Staphylococcus aureus: NEGATIVE

## 2011-06-06 NOTE — Patient Instructions (Addendum)
[place]  06/06/2011   Your procedure is scheduled on:  06-10-11  Report to [facility] at  0930 AM.  Call this number if you have problems the morning of surgery: [phone]   Remember:   Do not eat food:After Midnight.  May have clear liquids:until Midnight .  Clear liquids include soda, tea, black coffee, apple or grape juice, broth.  Take these medicines the morning of surgery with A SIP OF WATER: Megace, Colchicine, Allopurinol, Lexapro, Hydrocodone, Protonix. Short Stay will give you a dose of Amlodipine 5mg  on arrival day of surgery for blood pressure..   Do not wear jewelry, make-up or nail polish.  Do not wear lotions, powders, or perfumes. You may wear deodorant.  Do not shave 48 hours prior to surgery.  Do not bring valuables to the hospital.  Contacts, dentures or bridgework may not be worn into surgery.  Leave suitcase in the car. After surgery it may be brought to your room.  For patients admitted to the hospital, checkout time is 11:00 AM the day of discharge.   Patients discharged the day of surgery will not be allowed to drive home.  Name and phone number of your driver: [name] [phone].  Special Instructions: CHG Shower Use Special Wash: 1/2 bottle night before surgery and 1/2 bottle morning of surgery.   Please read over the following fact sheets that you were given: Blood Transfusion Information and MRSA Information

## 2011-06-08 ENCOUNTER — Telehealth: Payer: Self-pay

## 2011-06-08 DIAGNOSIS — C9210 Chronic myeloid leukemia, BCR/ABL-positive, not having achieved remission: Secondary | ICD-10-CM

## 2011-06-08 NOTE — Telephone Encounter (Signed)
Message left for pt to contact office re: lab results.  Per Dr [name] -  "BCR/ABL better. Continue Gleevec.  F/U as scheduled.  Repeat lab at next visit."    dph

## 2011-06-09 ENCOUNTER — Other Ambulatory Visit: Payer: Self-pay | Admitting: *Deleted

## 2011-06-09 DIAGNOSIS — C9210 Chronic myeloid leukemia, BCR/ABL-positive, not having achieved remission: Secondary | ICD-10-CM

## 2011-06-09 MED ORDER — IMATINIB MESYLATE 100 MG PO TABS: 200.0000 mg | ORAL_TABLET | Freq: Every day | ORAL | Status: DC

## 2011-06-09 MED ORDER — AMLODIPINE BESYLATE 5 MG PO TABS
5.0000 mg | ORAL_TABLET | Freq: Once | ORAL | Status: AC
  Administered 2011-06-10: 5 mg via ORAL
  Filled 2011-06-09: qty 1

## 2011-06-10 ENCOUNTER — Ambulatory Visit (HOSPITAL_COMMUNITY): Payer: Medicare Other

## 2011-06-10 ENCOUNTER — Encounter (HOSPITAL_COMMUNITY): Payer: Self-pay | Admitting: *Deleted

## 2011-06-10 ENCOUNTER — Encounter (HOSPITAL_COMMUNITY): Payer: Self-pay | Admitting: Certified Registered Nurse Anesthetist

## 2011-06-10 ENCOUNTER — Inpatient Hospital Stay (HOSPITAL_COMMUNITY)
Admission: RE | Admit: 2011-06-10 | Discharge: 2011-06-14 | DRG: 467 | Disposition: A | Discharge status: Home Health | Payer: Medicare Other | Source: Ambulatory Visit | Attending: Orthopaedic Surgery | Admitting: Orthopaedic Surgery

## 2011-06-10 ENCOUNTER — Encounter (HOSPITAL_COMMUNITY)
Admission: RE | Disposition: A | Discharge status: Home Health | Payer: Self-pay | Source: Ambulatory Visit | Attending: Orthopaedic Surgery

## 2011-06-10 ENCOUNTER — Inpatient Hospital Stay (HOSPITAL_COMMUNITY): Payer: Medicare Other | Admitting: Certified Registered Nurse Anesthetist

## 2011-06-10 DIAGNOSIS — I10 Essential (primary) hypertension: Secondary | ICD-10-CM

## 2011-06-10 DIAGNOSIS — Z01812 Encounter for preprocedural laboratory examination: Secondary | ICD-10-CM

## 2011-06-10 DIAGNOSIS — M1A9XX1 Chronic gout, unspecified, with tophus (tophi): Secondary | ICD-10-CM | POA: Diagnosis present

## 2011-06-10 DIAGNOSIS — M24669 Ankylosis, unspecified knee: Secondary | ICD-10-CM | POA: Diagnosis present

## 2011-06-10 DIAGNOSIS — F32A Depression, unspecified: Secondary | ICD-10-CM

## 2011-06-10 DIAGNOSIS — T84049A Periprosthetic fracture around unspecified internal prosthetic joint, initial encounter: Secondary | ICD-10-CM | POA: Diagnosis present

## 2011-06-10 DIAGNOSIS — IMO0001 Reserved for inherently not codable concepts without codable children: Secondary | ICD-10-CM

## 2011-06-10 DIAGNOSIS — Y33XXXS Other specified events, undetermined intent, sequela: Secondary | ICD-10-CM

## 2011-06-10 DIAGNOSIS — S78119A Complete traumatic amputation at level between unspecified hip and knee, initial encounter: Secondary | ICD-10-CM

## 2011-06-10 DIAGNOSIS — T84039A Mechanical loosening of unspecified internal prosthetic joint, initial encounter: Principal | ICD-10-CM | POA: Diagnosis present

## 2011-06-10 DIAGNOSIS — Z9660 Presence of unspecified orthopedic joint implant: Secondary | ICD-10-CM | POA: Diagnosis present

## 2011-06-10 DIAGNOSIS — Y831 Surgical operation with implant of artificial internal device as the cause of abnormal reaction of the patient, or of later complication, without mention of misadventure at the time of the procedure: Secondary | ICD-10-CM | POA: Diagnosis present

## 2011-06-10 DIAGNOSIS — M24662 Ankylosis, left knee: Secondary | ICD-10-CM

## 2011-06-10 DIAGNOSIS — M7020 Olecranon bursitis, unspecified elbow: Secondary | ICD-10-CM | POA: Diagnosis present

## 2011-06-10 DIAGNOSIS — F329 Major depressive disorder, single episode, unspecified: Secondary | ICD-10-CM

## 2011-06-10 DIAGNOSIS — Z01818 Encounter for other preprocedural examination: Secondary | ICD-10-CM

## 2011-06-10 DIAGNOSIS — Z96659 Presence of unspecified artificial knee joint: Secondary | ICD-10-CM

## 2011-06-10 DIAGNOSIS — C9210 Chronic myeloid leukemia, BCR/ABL-positive, not having achieved remission: Secondary | ICD-10-CM

## 2011-06-10 DIAGNOSIS — D62 Acute posthemorrhagic anemia: Secondary | ICD-10-CM | POA: Diagnosis not present

## 2011-06-10 DIAGNOSIS — C9590 Leukemia, unspecified not having achieved remission: Secondary | ICD-10-CM | POA: Diagnosis present

## 2011-06-10 HISTORY — PX: TOTAL KNEE REVISION: SHX996

## 2011-06-10 HISTORY — PX: OLECRANON BURSECTOMY: SHX2097

## 2011-06-10 SURGERY — TOTAL KNEE REVISION
Anesthesia: General | Site: Knee | Laterality: Left | Wound class: Clean

## 2011-06-10 MED ORDER — ESCITALOPRAM OXALATE 10 MG PO TABS
10.0000 mg | ORAL_TABLET | Freq: Every day | ORAL | Status: DC
  Administered 2011-06-11 – 2011-06-14 (×4): 10 mg via ORAL
  Filled 2011-06-10 (×5): qty 1

## 2011-06-10 MED ORDER — CEFAZOLIN SODIUM 1-5 GM-% IV SOLN
1.0000 g | Freq: Four times a day (QID) | INTRAVENOUS | Status: AC
  Administered 2011-06-10 – 2011-06-11 (×3): 1 g via INTRAVENOUS
  Filled 2011-06-10 (×3): qty 50

## 2011-06-10 MED ORDER — LABETALOL HCL 5 MG/ML IV SOLN
5.0000 mg | INTRAVENOUS | Status: AC | PRN
  Administered 2011-06-10 (×2): 5 mg via INTRAVENOUS
  Filled 2011-06-10: qty 4

## 2011-06-10 MED ORDER — STERILE WATER FOR IRRIGATION IR SOLN
Status: DC | PRN
  Administered 2011-06-10: 1500 mL

## 2011-06-10 MED ORDER — ALLOPURINOL 300 MG PO TABS
300.0000 mg | ORAL_TABLET | Freq: Every day | ORAL | Status: DC
  Administered 2011-06-10 – 2011-06-14 (×5): 300 mg via ORAL
  Filled 2011-06-10 (×6): qty 1

## 2011-06-10 MED ORDER — POTASSIUM CHLORIDE CRYS ER 20 MEQ PO TBCR
20.0000 meq | EXTENDED_RELEASE_TABLET | Freq: Every day | ORAL | Status: DC
  Administered 2011-06-10 – 2011-06-14 (×5): 20 meq via ORAL
  Filled 2011-06-10 (×7): qty 1

## 2011-06-10 MED ORDER — ROPIVACAINE HCL 5 MG/ML IJ SOLN
INTRAMUSCULAR | Status: AC
  Filled 2011-06-10: qty 30

## 2011-06-10 MED ORDER — OXYCODONE HCL 5 MG PO TABS
5.0000 mg | ORAL_TABLET | ORAL | Status: DC | PRN
  Administered 2011-06-10 – 2011-06-12 (×8): 10 mg via ORAL
  Administered 2011-06-12: 5 mg via ORAL
  Administered 2011-06-13 – 2011-06-14 (×6): 10 mg via ORAL
  Filled 2011-06-10 (×16): qty 2

## 2011-06-10 MED ORDER — LIDOCAINE HCL (CARDIAC) 20 MG/ML IV SOLN
INTRAVENOUS | Status: DC | PRN
  Administered 2011-06-10: 100 mg via INTRAVENOUS

## 2011-06-10 MED ORDER — LACTATED RINGERS IV SOLN
INTRAVENOUS | Status: DC
  Administered 2011-06-10: 1000 mL via INTRAVENOUS

## 2011-06-10 MED ORDER — FENTANYL CITRATE 0.05 MG/ML IJ SOLN
INTRAMUSCULAR | Status: AC
  Filled 2011-06-10: qty 2

## 2011-06-10 MED ORDER — PROMETHAZINE HCL 25 MG/ML IJ SOLN: 6.2500 mg | INTRAMUSCULAR | Status: DC | PRN

## 2011-06-10 MED ORDER — METHOCARBAMOL 100 MG/ML IJ SOLN
500.0000 mg | Freq: Four times a day (QID) | INTRAVENOUS | Status: DC | PRN
  Filled 2011-06-10: qty 5

## 2011-06-10 MED ORDER — COLCHICINE 0.6 MG PO TABS
0.6000 mg | ORAL_TABLET | Freq: Two times a day (BID) | ORAL | Status: DC
  Administered 2011-06-10 – 2011-06-14 (×8): 0.6 mg via ORAL
  Filled 2011-06-10 (×11): qty 1

## 2011-06-10 MED ORDER — LOPERAMIDE HCL 2 MG PO CAPS: 2.0000 mg | ORAL_CAPSULE | Freq: Four times a day (QID) | ORAL | Status: DC | PRN

## 2011-06-10 MED ORDER — METOCLOPRAMIDE HCL 10 MG PO TABS: 5.0000 mg | ORAL_TABLET | Freq: Three times a day (TID) | ORAL | Status: DC | PRN

## 2011-06-10 MED ORDER — MEGESTROL ACETATE 625 MG/5ML PO SUSP
625.0000 mg | Freq: Two times a day (BID) | ORAL | Status: DC
  Filled 2011-06-10 (×7): qty 5

## 2011-06-10 MED ORDER — DOCUSATE SODIUM 100 MG PO CAPS
100.0000 mg | ORAL_CAPSULE | Freq: Two times a day (BID) | ORAL | Status: DC
  Administered 2011-06-10 – 2011-06-14 (×8): 100 mg via ORAL
  Filled 2011-06-10 (×11): qty 1

## 2011-06-10 MED ORDER — PANTOPRAZOLE SODIUM 40 MG PO TBEC
40.0000 mg | DELAYED_RELEASE_TABLET | Freq: Every day | ORAL | Status: DC
  Administered 2011-06-10 – 2011-06-14 (×5): 40 mg via ORAL
  Filled 2011-06-10 (×6): qty 1

## 2011-06-10 MED ORDER — IMATINIB MESYLATE 100 MG PO TABS: 200.0000 mg | ORAL_TABLET | Freq: Every day | ORAL | Status: DC

## 2011-06-10 MED ORDER — ROCURONIUM BROMIDE 100 MG/10ML IV SOLN
INTRAVENOUS | Status: DC | PRN
  Administered 2011-06-10: 40 mg via INTRAVENOUS
  Administered 2011-06-10 (×2): 10 mg via INTRAVENOUS

## 2011-06-10 MED ORDER — BUPIVACAINE HCL (PF) 0.25 % IJ SOLN
INTRAMUSCULAR | Status: AC
  Filled 2011-06-10: qty 30

## 2011-06-10 MED ORDER — AMLODIPINE BESYLATE-VALSARTAN 5-160 MG PO TABS: 1.0000 | ORAL_TABLET | Freq: Every day | ORAL | Status: DC

## 2011-06-10 MED ORDER — MIDAZOLAM HCL 5 MG/5ML IJ SOLN
INTRAMUSCULAR | Status: DC | PRN
  Administered 2011-06-10: 2 mg via INTRAVENOUS

## 2011-06-10 MED ORDER — OLMESARTAN MEDOXOMIL 20 MG PO TABS
20.0000 mg | ORAL_TABLET | Freq: Every day | ORAL | Status: DC
  Administered 2011-06-11 – 2011-06-14 (×4): 20 mg via ORAL
  Filled 2011-06-10 (×5): qty 1

## 2011-06-10 MED ORDER — PHENOL 1.4 % MT LIQD
1.0000 | OROMUCOSAL | Status: DC | PRN
  Filled 2011-06-10: qty 177

## 2011-06-10 MED ORDER — FENTANYL CITRATE 0.05 MG/ML IJ SOLN
INTRAMUSCULAR | Status: DC | PRN
  Administered 2011-06-10 (×4): 50 ug via INTRAVENOUS

## 2011-06-10 MED ORDER — ACETAMINOPHEN 325 MG PO TABS: 650.0000 mg | ORAL_TABLET | Freq: Four times a day (QID) | ORAL | Status: DC | PRN

## 2011-06-10 MED ORDER — PROPOFOL 10 MG/ML IV EMUL
INTRAVENOUS | Status: DC | PRN
  Administered 2011-06-10: 150 mg via INTRAVENOUS

## 2011-06-10 MED ORDER — LACTATED RINGERS IV SOLN: INTRAVENOUS | Status: DC

## 2011-06-10 MED ORDER — DIPHENHYDRAMINE HCL 12.5 MG/5ML PO ELIX: 12.5000 mg | ORAL_SOLUTION | ORAL | Status: DC | PRN

## 2011-06-10 MED ORDER — HYDROMORPHONE HCL PF 1 MG/ML IJ SOLN
INTRAMUSCULAR | Status: DC | PRN
  Administered 2011-06-10: 0.5 mg via INTRAVENOUS

## 2011-06-10 MED ORDER — ONDANSETRON HCL 4 MG/2ML IJ SOLN
INTRAMUSCULAR | Status: DC | PRN
  Administered 2011-06-10: 4 mg via INTRAVENOUS

## 2011-06-10 MED ORDER — SODIUM CHLORIDE 0.9 % IV SOLN
INTRAVENOUS | Status: DC
  Administered 2011-06-10 – 2011-06-13 (×3): via INTRAVENOUS
  Administered 2011-06-13: 1000 mL via INTRAVENOUS

## 2011-06-10 MED ORDER — MENTHOL 3 MG MT LOZG
1.0000 | LOZENGE | OROMUCOSAL | Status: DC | PRN
  Filled 2011-06-10: qty 9

## 2011-06-10 MED ORDER — METHOCARBAMOL 500 MG PO TABS
500.0000 mg | ORAL_TABLET | Freq: Four times a day (QID) | ORAL | Status: DC | PRN
  Administered 2011-06-10 – 2011-06-14 (×10): 500 mg via ORAL
  Filled 2011-06-10 (×10): qty 1

## 2011-06-10 MED ORDER — GLYCOPYRROLATE 0.2 MG/ML IJ SOLN
INTRAMUSCULAR | Status: DC | PRN
  Administered 2011-06-10: 0.2 mg via INTRAVENOUS
  Administered 2011-06-10: .4 mg via INTRAVENOUS

## 2011-06-10 MED ORDER — METOCLOPRAMIDE HCL 5 MG/ML IJ SOLN: 5.0000 mg | Freq: Three times a day (TID) | INTRAMUSCULAR | Status: DC | PRN

## 2011-06-10 MED ORDER — HYDROCODONE-ACETAMINOPHEN 5-325 MG PO TABS
1.0000 | ORAL_TABLET | ORAL | Status: DC | PRN
  Administered 2011-06-11 (×3): 2 via ORAL
  Administered 2011-06-11: 1 via ORAL
  Administered 2011-06-12 – 2011-06-13 (×5): 2 via ORAL
  Filled 2011-06-10 (×3): qty 2
  Filled 2011-06-10: qty 1
  Filled 2011-06-10 (×2): qty 2
  Filled 2011-06-10: qty 1
  Filled 2011-06-10 (×4): qty 2

## 2011-06-10 MED ORDER — ONDANSETRON HCL 4 MG PO TABS: 4.0000 mg | ORAL_TABLET | Freq: Four times a day (QID) | ORAL | Status: DC | PRN

## 2011-06-10 MED ORDER — MIDAZOLAM HCL 2 MG/2ML IJ SOLN
INTRAMUSCULAR | Status: AC
  Filled 2011-06-10: qty 2

## 2011-06-10 MED ORDER — AMLODIPINE BESYLATE 5 MG PO TABS
5.0000 mg | ORAL_TABLET | Freq: Every day | ORAL | Status: DC
  Administered 2011-06-11 – 2011-06-14 (×4): 5 mg via ORAL
  Filled 2011-06-10 (×5): qty 1

## 2011-06-10 MED ORDER — MIDAZOLAM HCL 10 MG/2ML IJ SOLN: 1.0000 mg | INTRAMUSCULAR | Status: DC | PRN

## 2011-06-10 MED ORDER — ALUM & MAG HYDROXIDE-SIMETH 200-200-20 MG/5ML PO SUSP
30.0000 mL | ORAL | Status: DC | PRN
  Administered 2011-06-11 – 2011-06-13 (×3): 30 mL via ORAL
  Filled 2011-06-10 (×3): qty 30

## 2011-06-10 MED ORDER — BUPIVACAINE HCL (PF) 0.25 % IJ SOLN
INTRAMUSCULAR | Status: DC | PRN
  Administered 2011-06-10: 10 mL

## 2011-06-10 MED ORDER — SODIUM CHLORIDE 0.9 % IR SOLN
Status: DC | PRN
  Administered 2011-06-10: 1000 mL

## 2011-06-10 MED ORDER — ZOLPIDEM TARTRATE 5 MG PO TABS: 5.0000 mg | ORAL_TABLET | Freq: Every evening | ORAL | Status: DC | PRN

## 2011-06-10 MED ORDER — ACETAMINOPHEN 10 MG/ML IV SOLN
INTRAVENOUS | Status: DC | PRN
  Administered 2011-06-10: 1000 mg via INTRAVENOUS

## 2011-06-10 MED ORDER — MEGESTROL ACETATE 40 MG/ML PO SUSP
800.0000 mg | Freq: Two times a day (BID) | ORAL | Status: DC
  Administered 2011-06-10 – 2011-06-14 (×8): 800 mg via ORAL
  Filled 2011-06-10 (×11): qty 20

## 2011-06-10 MED ORDER — NEOSTIGMINE METHYLSULFATE 1 MG/ML IJ SOLN
INTRAMUSCULAR | Status: DC | PRN
  Administered 2011-06-10: 3 mg via INTRAVENOUS

## 2011-06-10 MED ORDER — ACETAMINOPHEN 650 MG RE SUPP: 650.0000 mg | Freq: Four times a day (QID) | RECTAL | Status: DC | PRN

## 2011-06-10 MED ORDER — MORPHINE SULFATE 2 MG/ML IJ SOLN
1.0000 mg | INTRAMUSCULAR | Status: DC | PRN
  Administered 2011-06-10 – 2011-06-13 (×15): 1 mg via INTRAVENOUS
  Filled 2011-06-10 (×14): qty 1

## 2011-06-10 MED ORDER — MORPHINE SULFATE 2 MG/ML IJ SOLN
INTRAMUSCULAR | Status: AC
  Administered 2011-06-10: 1 mg via INTRAVENOUS
  Filled 2011-06-10: qty 1

## 2011-06-10 MED ORDER — ONDANSETRON HCL 4 MG/2ML IJ SOLN: 4.0000 mg | Freq: Four times a day (QID) | INTRAMUSCULAR | Status: DC | PRN

## 2011-06-10 MED ORDER — EPHEDRINE SULFATE 50 MG/ML IJ SOLN
INTRAMUSCULAR | Status: DC | PRN
  Administered 2011-06-10 (×3): 5 mg via INTRAVENOUS

## 2011-06-10 MED ORDER — HYDROMORPHONE HCL PF 1 MG/ML IJ SOLN
0.2500 mg | INTRAMUSCULAR | Status: DC | PRN
Start: 2011-06-10 — End: 2011-06-10

## 2011-06-10 MED ORDER — RIVAROXABAN 10 MG PO TABS
10.0000 mg | ORAL_TABLET | Freq: Every day | ORAL | Status: DC
  Administered 2011-06-11 – 2011-06-14 (×4): 10 mg via ORAL
  Filled 2011-06-10 (×5): qty 1

## 2011-06-10 MED ORDER — FERROUS SULFATE 325 (65 FE) MG PO TABS
325.0000 mg | ORAL_TABLET | Freq: Three times a day (TID) | ORAL | Status: DC
  Administered 2011-06-10 – 2011-06-14 (×12): 325 mg via ORAL
  Filled 2011-06-10 (×16): qty 1

## 2011-06-10 MED ORDER — CEFAZOLIN SODIUM 1-5 GM-% IV SOLN
1.0000 g | INTRAVENOUS | Status: AC
  Administered 2011-06-10: 1 g via INTRAVENOUS

## 2011-06-10 SURGICAL SUPPLY — 82 items
AUG FEM 4 4 CMB LF KN POST (Knees) ×2 IMPLANT
AUGMENT POSTERIOR PFC SZ4 4MM (Knees) IMPLANT
BAG SPEC THK2 15X12 ZIP CLS (MISCELLANEOUS) ×2
BAG ZIPLOCK 12X15 (MISCELLANEOUS) ×3 IMPLANT
BANDAGE ELASTIC 4 VELCRO ST LF (GAUZE/BANDAGES/DRESSINGS) ×3 IMPLANT
BANDAGE ELASTIC 6 VELCRO ST LF (GAUZE/BANDAGES/DRESSINGS) ×5 IMPLANT
BANDAGE ESMARK 6X9 LF (GAUZE/BANDAGES/DRESSINGS) ×2 IMPLANT
BANDAGE GAUZE ELAST BULKY 4 IN (GAUZE/BANDAGES/DRESSINGS) ×3 IMPLANT
BIT DRILL CANN 3.2MM (BIT) IMPLANT
BLADE SAG 18X100X1.27 (BLADE) ×3 IMPLANT
BLADE SAW SGTL 13.0X1.19X90.0M (BLADE) ×3 IMPLANT
BNDG CMPR 9X6 STRL LF SNTH (GAUZE/BANDAGES/DRESSINGS) ×2
BNDG ESMARK 6X9 LF (GAUZE/BANDAGES/DRESSINGS) ×3
BONE CEMENT GENTAMICIN (Cement) ×6 IMPLANT
CEMENT BONE GENTAMICIN 40 (Cement) IMPLANT
CLOTH BEACON ORANGE TIMEOUT ST (SAFETY) ×3 IMPLANT
CUFF TOURN SGL QUICK 18 (TOURNIQUET CUFF) ×1 IMPLANT
CUFF TOURN SGL QUICK 24 (TOURNIQUET CUFF)
CUFF TOURN SGL QUICK 34 (TOURNIQUET CUFF) ×3
CUFF TRNQT CYL 24X4X40X1 (TOURNIQUET CUFF) IMPLANT
CUFF TRNQT CYL 34X4X40X1 (TOURNIQUET CUFF) ×2 IMPLANT
DRAIN PENROSE 18X1/2 LTX STRL (DRAIN) ×2 IMPLANT
DRAPE ORTHO SPLIT 77X108 STRL (DRAPES) ×6
DRAPE POUCH INSTRU U-SHP 10X18 (DRAPES) ×3 IMPLANT
DRAPE SURG ORHT 6 SPLT 77X108 (DRAPES) ×4 IMPLANT
DRAPE U-SHAPE 47X51 STRL (DRAPES) ×3 IMPLANT
DRILL BIT CANN 3.2MM (BIT) ×1
DRSG PAD ABDOMINAL 8X10 ST (GAUZE/BANDAGES/DRESSINGS) ×6 IMPLANT
DURAPREP 26ML APPLICATOR (WOUND CARE) ×4 IMPLANT
ELECT REM PT RETURN 9FT ADLT (ELECTROSURGICAL) ×3
ELECTRODE REM PT RTRN 9FT ADLT (ELECTROSURGICAL) ×2 IMPLANT
EVACUATOR 1/8 PVC DRAIN (DRAIN) ×3 IMPLANT
FACESHIELD LNG OPTICON STERILE (SAFETY) ×15 IMPLANT
FEMUR SIGMA PS SZ 4.0 L (Knees) ×1 IMPLANT
GAUZE SPONGE 4X4 12PLY STRL LF (GAUZE/BANDAGES/DRESSINGS) ×2 IMPLANT
GAUZE XEROFORM 5X9 LF (GAUZE/BANDAGES/DRESSINGS) ×5 IMPLANT
GLOVE BIO SURGEON STRL SZ7.5 (GLOVE) ×3 IMPLANT
GLOVE BIOGEL PI IND STRL 6.5 (GLOVE) IMPLANT
GLOVE BIOGEL PI INDICATOR 6.5 (GLOVE) ×2
GLOVE ORTHO TXT STRL SZ7.5 (GLOVE) ×3 IMPLANT
GLOVE SURG SS PI 6.5 STRL IVOR (GLOVE) ×2 IMPLANT
GLOVE SURG SS PI 7.5 STRL IVOR (GLOVE) ×2 IMPLANT
GLOVE SURG SS PI 8.0 STRL IVOR (GLOVE) ×3 IMPLANT
GOWN STRL NON-REIN LRG LVL3 (GOWN DISPOSABLE) ×2 IMPLANT
GOWN STRL REIN XL XLG (GOWN DISPOSABLE) ×5 IMPLANT
GUIDEWIRE THREADED 1.6 (WIRE) ×2 IMPLANT
HANDPIECE INTERPULSE COAX TIP (DISPOSABLE) ×3
IMMOBILIZER KNEE 20 (SOFTGOODS) ×3
IMMOBILIZER KNEE 20 THIGH 36 (SOFTGOODS) IMPLANT
KIT BASIN OR (CUSTOM PROCEDURE TRAY) ×3 IMPLANT
NDL HYPO 21X1.5 SAFETY (NEEDLE) ×2 IMPLANT
NEEDLE HYPO 21X1.5 SAFETY (NEEDLE) ×3 IMPLANT
NS IRRIG 1000ML POUR BTL (IV SOLUTION) ×3 IMPLANT
PACK LOWER EXTREMITY WL (CUSTOM PROCEDURE TRAY) ×3 IMPLANT
PACK TOTAL JOINT (CUSTOM PROCEDURE TRAY) ×3 IMPLANT
PAD CAST 4YDX4 CTTN HI CHSV (CAST SUPPLIES) ×4 IMPLANT
PADDING CAST COTTON 4X4 STRL (CAST SUPPLIES) ×6
PADDING CAST COTTON 6X4 STRL (CAST SUPPLIES) ×6 IMPLANT
PADDING WEBRIL 4 STERILE (GAUZE/BANDAGES/DRESSINGS) ×1 IMPLANT
PADDING WEBRIL 6 STERILE (GAUZE/BANDAGES/DRESSINGS) ×1 IMPLANT
PEG PATELLA CEM MB COMP LG (Knees) ×1 IMPLANT
PLATE ROT INSERT 10MM SIZE 4 (Plate) ×1 IMPLANT
POSITIONER SURGICAL ARM (MISCELLANEOUS) ×3 IMPLANT
POSTERIOR AUGMENT PFC SZ4 4MM (Knees) ×3 IMPLANT
SCREW CANN 28MM (Screw) ×1 IMPLANT
SET HNDPC FAN SPRY TIP SCT (DISPOSABLE) ×2 IMPLANT
SET PAD KNEE POSITIONER (MISCELLANEOUS) ×3 IMPLANT
SPONGE GAUZE 4X4 12PLY (GAUZE/BANDAGES/DRESSINGS) ×3 IMPLANT
SPONGE LAP 18X18 X RAY DECT (DISPOSABLE) ×3 IMPLANT
STAPLER VISISTAT 35W (STAPLE) ×3 IMPLANT
SUCTION FRAZIER 12FR DISP (SUCTIONS) ×3 IMPLANT
SUT VIC AB 0 CT1 27 (SUTURE) ×12
SUT VIC AB 0 CT1 27XBRD ANTBC (SUTURE) ×8 IMPLANT
SUT VIC AB 1 CT1 27 (SUTURE) ×9
SUT VIC AB 1 CT1 27XBRD ANTBC (SUTURE) ×6 IMPLANT
SUT VIC AB 2-0 CT1 27 (SUTURE) ×9
SUT VIC AB 2-0 CT1 TAPERPNT 27 (SUTURE) ×6 IMPLANT
SYR CONTROL 10ML LL (SYRINGE) ×3 IMPLANT
TOWEL OR 17X26 10 PK STRL BLUE (TOWEL DISPOSABLE) ×9 IMPLANT
TRAY FOLEY CATH 14FRSI W/METER (CATHETERS) ×3 IMPLANT
WASHER F/4.5 SCREW (Orthopedic Implant) ×1 IMPLANT
WATER STERILE IRR 1500ML POUR (IV SOLUTION) ×3 IMPLANT

## 2011-06-10 NOTE — Anesthesia Postprocedure Evaluation (Signed)
  Anesthesia Post-op Note  Patient: [name]  Procedure(s) Performed:  TOTAL KNEE REVISION - Left Total Knee Arthroplasty Revision; OLECRANON BURSA - Excision Left Elbow Olecranon Bursa  Patient Location: PACU  Anesthesia Type: GA combined with regional for post-op pain  Level of Consciousness: awake and alert   Airway and Oxygen Therapy: Patient Spontanous Breathing  Post-op Pain: mild  Post-op Assessment: Post-op Vital signs reviewed, Patient's Cardiovascular Status Stable, Respiratory Function Stable, Patent Airway and No signs of Nausea or vomiting  Post-op Vital Signs: stable  Complications: No apparent anesthesia complications

## 2011-06-10 NOTE — Transfer of Care (Signed)
Immediate Anesthesia Transfer of Care Note  Patient: [name]  Procedure(s) Performed:  TOTAL KNEE REVISION - Left Total Knee Arthroplasty Revision; OLECRANON BURSA - Excision Left Elbow Olecranon Bursa  Patient Location: PACU  Anesthesia Type: General  Level of Consciousness: awake, alert , oriented and patient cooperative  Airway & Oxygen Therapy: Patient Spontanous Breathing and Patient connected to face mask oxygen  Post-op Assessment: Report given to PACU RN, Post -op Vital signs reviewed and stable and Patient moving all extremities  Post vital signs: Reviewed and stable  Complications: No apparent anesthesia complications
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
## 2011-06-10 NOTE — Anesthesia Preprocedure Evaluation (Signed)
Anesthesia Evaluation  Patient identified by MRN, date of birth, ID band Patient awake    Reviewed: Allergy & Precautions, H&P , NPO status , Patient's Chart, lab work & pertinent test results  Airway Mallampati: II TM Distance: >3 FB Neck ROM: full    Dental No notable dental hx. (+) Missing Most front teeth missing:   Pulmonary neg pulmonary ROS,  clear to auscultation  Pulmonary exam normal       Cardiovascular Exercise Tolerance: Good hypertension, On Medications regular Normal    Neuro/Psych Negative Neurological ROS  Negative Psych ROS   GI/Hepatic negative GI ROS, Neg liver ROS,   Endo/Other  Negative Endocrine ROS  Renal/GU negative Renal ROS  Genitourinary negative   Musculoskeletal   Abdominal   Peds  Hematology Leukemia- on oral chemo   Anesthesia Other Findings   Reproductive/Obstetrics negative OB ROS                           Anesthesia Physical Anesthesia Plan  ASA: II  Anesthesia Plan: General   Post-op Pain Management:    Induction: Intravenous  Airway Management Planned: Oral ETT  Additional Equipment:   Intra-op Plan:   Post-operative Plan: Extubation in OR  Informed Consent: I have reviewed the patients History and Physical, chart, labs and discussed the procedure including the risks, benefits and alternatives for the proposed anesthesia with the patient or authorized representative who has indicated his/her understanding and acceptance.   Dental Advisory Given  Plan Discussed with: CRNA and Surgeon  Anesthesia Plan Comments:         Anesthesia Quick Evaluation

## 2011-06-10 NOTE — OR Nursing (Signed)
06-09-11 OR Nursing note.  Second procedure of Left Elbow Bursectomy started at 1623.  Procedure end at 1638.

## 2011-06-10 NOTE — Brief Op Note (Signed)
06/10/2011  4:53 PM  PATIENT:  [name]  66 y.o. male  PRE-OPERATIVE DIAGNOSIS:  Failed left total knee replacement, left elbow olecranon bursitis  POST-OPERATIVE DIAGNOSIS:  Failed left total knee replacement left elbow olecranon bursitis  PROCEDURE:  Procedure(s): TOTAL KNEE REVISION OLECRANON BURSA  SURGEON:  Surgeon(s): Mcarthur Rossetti, MD  PHYSICIAN ASSISTANT:   ASSISTANTS: Phillips Hay, PA-C   ANESTHESIA:   local and general  EBL:  Total I/O In: 70 [I.V.:2600] Out: 300 [Urine:200; Blood:100]  BLOOD ADMINISTERED:none  DRAINS: (medium) Hemovact drain(s) in the knee with  Suction Open   LOCAL MEDICATIONS USED:  MARCAINE 10CC  SPECIMEN:  No Specimen  DISPOSITION OF SPECIMEN:  N/A  COUNTS:  YES  TOURNIQUET:   Total Tourniquet Time Documented: Thigh (Left) - 111 minutes  DICTATION: .Other Dictation: Dictation Number [id number]  PLAN OF CARE: Admit to inpatient   PATIENT DISPOSITION:  PACU - hemodynamically stable.   Delay start of Pharmacological VTE agent (>24hrs) due to surgical blood loss or risk of bleeding:  {YES/NO/NOT APPLICABLE:20182

## 2011-06-11 LAB — BASIC METABOLIC PANEL
CO2: 23 mEq/L (ref 19–32)
Calcium: 8.6 mg/dL (ref 8.4–10.5)
Creatinine, Ser: 0.77 mg/dL (ref 0.50–1.35)
GFR calc non Af Amer: >90 mL/min (ref >90)

## 2011-06-11 LAB — CBC
MCH: 26.3 pg (ref 26.0–34.0)
MCHC: 33.7 g/dL (ref 30.0–36.0)
MCV: 77.9 fL — ABNORMAL LOW (ref 78.0–100.0)
Platelets: 136 10*3/uL — ABNORMAL LOW (ref 150–400)
RBC: 3.12 MIL/uL — ABNORMAL LOW (ref 4.22–5.81)

## 2011-06-11 NOTE — Progress Notes (Signed)
CM spoke with patient concerning d/c planning. Per PT recommendation SNF vs 24 hour supervision if discharged home. Patient states 3 adult daughters to assist with home care. CM left weekend case manager contact number for adult daughter to contact CM to discuss PT recommendations. RN notified to have adult daughter contact CM. Per pt choice if discharged home [facility] to provide [facility] services + DME. [facility] notified of referral.   Arlean Hopping [phone]

## 2011-06-11 NOTE — Progress Notes (Signed)
Subjective: 1 Day Post-Op Procedure(s) (LRB): TOTAL KNEE REVISION (Left) OLECRANON BURSA (Left) Patient reports pain as moderate.    Objective: Vital signs in last 24 hours: Temp:  [97.5 F (36.4 C)-99.8 F (37.7 C)] 99.3 F (37.4 C) (02/02 1400) Pulse Rate:  [65-102] 92  (02/02 1400) Resp:  [12-20] 20  (02/02 1400) BP: (130-159)/(70-94) 138/85 mmHg (02/02 1400) SpO2:  [99 %-100 %] 99 % (02/02 1400) Weight:  [69.31 kg (152 lb 12.8 oz)] 69.31 kg (152 lb 12.8 oz) (02/02 1322)  Intake/Output from previous day: 02/01 0701 - 02/02 0700 In: 4061.3 [P.O.:480; I.V.:3481.3; IV Piggyback:100] Out: 1445 [Urine:1200; Drains:145; Blood:100] Intake/Output this shift: Total I/O In: 600 [P.O.:600] Out: 200 [Urine:200]   Basename 06/11/11 0359  HGB 8.2*    Basename 06/11/11 0359  WBC 6.8  RBC 3.12*  HCT 24.3*  PLT 136*    Basename 06/11/11 0359  NA 141  K 3.5  CL 109  CO2 23  BUN 13  CREATININE 0.77  GLUCOSE 111*  CALCIUM 8.6   No results found for this basename: LABPT:2,INR:2 in the last 72 hours  Neurologically intact  Assessment/Plan: 1 Day Post-Op Procedure(s) (LRB): TOTAL KNEE REVISION (Left) OLECRANON BURSA (Left) Advance diet  Lexii Walsh C 06/11/2011, 3:14 PM
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
## 2011-06-11 NOTE — Op Note (Signed)
NAMEDALEY, ALARIE NO.:  [id number]  MEDICAL RECORD NO.:  [id number]  LOCATION:  [id number]                         FACILITY:  [facility]  PHYSICIAN:  Lind Guest. Ninfa Linden, M.D.DATE OF BIRTH:  06/11/1945  DATE OF PROCEDURE:  06/10/2011 DATE OF DISCHARGE:                              OPERATIVE REPORT   PREOPERATIVE DIAGNOSES: 1. 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 2. 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  POSTOPERATIVE DIAGNOSES: 1. Loose femoral component, left knee. 2. Olecranon bursitis with tophaceous gout.  PROCEDURES: 1. Revision arthroplasty of femoral component and polyethylene liner,     left knee. 2. Excision of left elbow olecranon bursa.  SURGEON:  Lind Guest. Ninfa Linden, M.D.  ASSISTANT:  Epimenio Foot, P.A.  ANESTHESIA: 1. Left leg femoral nerve block. 2. General.  BLOOD LOSS:  100-200 cc.  COMPLICATIONS:  Intraoperative fracture of the medial condyle to soft bony and the bony deficit repaired with a 4.5 mm cannulated screw.  INDICATIONS:  Mr. Langford is a 66 year old individual with left knee pain and a flexion contracture, status post primary total knee replacement by another surgeon here in [place].  I have seen Mr. Wheelus before and I saw him in the office.  He did not show any evidence on plain films of loosening of his total knee replacement or an infection, but he did have severe pain, swelling, and a flexion contracture of at least lacking full extension by about 15 degrees.  He can flex to easily 110 degrees. I recommended he undergo revision of likely the femoral component and the polyethylene liner.  The tibial component looks to be well seated. He also let me know that while we were under anesthesia he would like to have a large olecranon bursa removed.  He has a hard bursa sac filled with gout tissue, which has been well documented.  Same setting in the operating room I agreed to proceed with  removing this mass from his left elbow.  PROCEDURE DESCRIPTION:  After informed consent was obtained, appropriate left elbow and left arm and left leg were marked.  Anesthesia obtained a femoral nerve block.  He was brought to the operating room and placed supine on the operating table.  We decided to proceed first with the knee since this was the bigger issue.  We placed a nonsterile tourniquet around his upper left thigh and his left leg was prepped and draped from the thigh down to the ankle with DuraPrep and sterile drapes including a sterile stockinette.  Time-out was called and he was identified as correct patient, correct left knee.  I then used an Esmarch to wrap out the leg and the tourniquet was inflated to 300 mm of pressure.  We dissected through his main incision and went down to the knee.  There was a minimal effusion encountered.  No evidence of infection at all. The polyethylene liner was removed and we cleaned scar tissue with synovectomy throughout the knee performed as well.  We then removed the polyethylene tibial insert and found the tibial tray to not be loose whatsoever.  However, the femoral component ended up being kind of loose and we removed it  easily and all the cement debris.  The bone was quite soft and there was a small fracture line that could to be seen in the medial femoral condyle.  We then proceeded with revision of the femoral component only.  We chose a size 4 femoral component and made our anterior and posterior cuts and chamfer cuts for this as well as a distal femoral cut.  We needed to place the augment under the medial side on our trial component.  We placed the trial component on.  This certainly did propagated the fracture of the condyle.  We then took the size 4 femur and cemented the real size 4 femur from [place] and held the fractured condyle together with a tenaculum.  Once the cement had hardened around the femoral component and the medial  condylar piece, it was nice and stable with varus and valgus stressing, but I still felt the need to place a single 4.5 mm cannulated screw with a washer.  This was a partially threaded screw measuring 28 mm in length.  I then placed a real size 10 polyethylene insert and snapped on a new patellar button. The tourniquet was let down.  Hemostasis with obtained with electrocautery.  We placed a medium Hemovac in the arthrotomy and closed the arthrotomy with #1 Vicryl, followed by 0 Vicryl in the subcutaneous tissue, 2-0 Vicryl in the subcuticular tissue, and staples on the skin. Well-padded sterile dressing was applied.  Knee immobilizer was applied as well.  We then turned our attention to the left elbow.  The left elbow was prepped and draped with DuraPrep and sterile drapes including a sterile stockinette.  Time-out was called and he was identified as correct patient and correct left elbow.  I then made incision directly over the large ping pong ball-sized mass and found tophaceous gout.  We excised this bursa sac in its entirety and then copiously irrigated the tissue with normal saline.  Hemostasis was obtained with electrocautery and then I reapproximated the skin with interrupted 3-0 nylon suture. We infiltrated the incision with 0.25% plain Sensorcaine and placed well- padded sterile dressing as well.  He was then awakened, extubated, and taken to recovery room in stable condition.  All final counts correct as well.     Lind Guest. Ninfa Linden, M.D.     CYB/MEDQ  D:  06/10/2011  T:  06/11/2011  Job:  [id number]

## 2011-06-11 NOTE — Progress Notes (Signed)
CARE MANAGEMENT NOTE 06/11/2011  Patient:  [name], [name]   Account Number:  [id number]  Date Initiated:  06/11/2011  Documentation initiated by:  Annajulia Lewing  Subjective/Objective Assessment:   66 yo admitted s/p Left Total Knee Arthroplasty Revision; OLECRANON BURSA - Excision Left Elbow Olecranon Bursa. PTA lived alone with 3 adult daughters living close by.     Action/Plan:   SNF vs HH   Anticipated DC Date:  06/14/2011   Anticipated DC Plan:  [place]  In-house referral  NA      DC Planning Services  CM consult      [facility] Choice  HOME HEALTH  DURABLE MEDICAL EQUIPMENT   Choice offered to / List presented to:  C-1 Patient   DME arranged  Vassie Moselle      DME agency  [place] arranged  [place] RN      [place].   Status of service:  In process, will continue to follow Medicare Important Message given?   (If response is "NO", the following Medicare IM given date fields will be blank) Date Medicare IM given:   Date Additional Medicare IM given:    Discharge Disposition:    Per UR Regulation:    Comments:  06/11/11 1257 CM spoke with patient concerning d/c planning. Per PT recommendation SNF vs 24 hour supervision if discharged home. Patient states 3 adult daughters to assist with home care. CM left weekend case manager contact number for adult daughter to contact CM to discuss PT recommendations. RN notified to have adult daughter contact CM. Per pt choice if discharged home [facility] to provide [facility] services + DME. [facility] notified of referral.   Arlean Hopping [phone]

## 2011-06-12 LAB — CBC
MCH: 26.2 pg (ref 26.0–34.0)
MCV: 78.3 fL (ref 78.0–100.0)
Platelets: 115 10*3/uL — ABNORMAL LOW (ref 150–400)
RDW: 17.5 % — ABNORMAL HIGH (ref 11.5–15.5)
WBC: 7.7 10*3/uL (ref 4.0–10.5)

## 2011-06-12 NOTE — Progress Notes (Signed)
Subjective: 2 Days Post-Op Procedure(s) (LRB): TOTAL KNEE REVISION (Left) OLECRANON BURSA (Left) Patient reports pain as moderate.    Objective: Vital signs in last 24 hours: Temp:  [98.4 F (36.9 C)-99.6 F (37.6 C)] 99 F (37.2 C) (02/03 1050) Pulse Rate:  [92-116] 98  (02/03 1050) Resp:  [16-20] 16  (02/03 1050) BP: (138-173)/(79-98) 150/90 mmHg (02/03 1050) SpO2:  [98 %-99 %] 98 % (02/02 2115) Weight:  [69.31 kg (152 lb 12.8 oz)] 69.31 kg (152 lb 12.8 oz) (02/02 1322)  Intake/Output from previous day: 02/02 0701 - 02/03 0700 In: 2432.5 [P.O.:600; I.V.:1832.5] Out: 800 [Urine:800] Intake/Output this shift: Total I/O In: 370 [P.O.:120; I.V.:250] Out: -    Basename 06/12/11 0450 06/11/11 0359  HGB 7.0* 8.2*    Basename 06/12/11 0450 06/11/11 0359  WBC 7.7 6.8  RBC 2.67* 3.12*  HCT 20.9* 24.3*  PLT 115* 136*    Basename 06/11/11 0359  NA 141  K 3.5  CL 109  CO2 23  BUN 13  CREATININE 0.77  GLUCOSE 111*  CALCIUM 8.6   No results found for this basename: LABPT:2,INR:2 in the last 72 hours  Neurologically intact  Assessment/Plan: 2 Days Post-Op Procedure(s) (LRB): TOTAL KNEE REVISION (Left) OLECRANON BURSA (Left)  Plan   2 unitsl PRBCs for Hgb of 7.0.  Dressing change elbow and knee  Kewon Statler C 06/12/2011, 11:23 AM

## 2011-06-12 NOTE — Progress Notes (Signed)
Physical Therapy Treatment Patient Details Name: [name] MRN: [id number] DOB: Jan 08, 1946 Today's Date: 06/12/2011 1445-1500, ta  PT Assessment/Plan  PT - Assessment/Plan Comments on Treatment Session: Pt with Hgb today of 7 and received 2 units of blood.  Pt appeared weaker today.  MD did write orders for WBAT for L UE.  Pt declined to wear prosthesis and therefore was only able to perform SPT with increased A today due to weakness and WB status. PT Plan: Other (comment) (Pt wants to go home, but based on today, rec. SNF.) PT Frequency: 7X/week Follow Up Recommendations: Skilled nursing facility Equipment Recommended: Defer to next venue PT Goals  Acute Rehab PT Goals Pt will go Supine/Side to Sit: with modified independence PT Goal: Supine/Side to Sit - Progress: Progressing toward goal Pt will go Sit to Stand: with min assist PT Goal: Sit to Stand - Progress: Progressing toward goal Pt will go Stand to Sit: with min assist PT Goal: Stand to Sit - Progress: Progressing toward goal Pt will Perform Home Exercise Program: with supervision, verbal cues required/provided PT Goal: Perform Home Exercise Program - Progress: Progressing toward goal  PT Treatment Precautions/Restrictions  Precautions Precautions: Knee Precaution Comments: NO ROM left knee Required Braces or Orthoses: Yes Knee Immobilizer: On when out of bed or walking;Discontinue once straight leg raise with < 10 degree lag Restrictions Weight Bearing Restrictions: Yes LUE Weight Bearing: Weight bearing as tolerated LLE Weight Bearing: Partial weight bearing LLE Partial Weight Bearing Percentage or Pounds: 50 Mobility (including Balance) Bed Mobility Supine to Sit: 5: Supervision;HOB elevated (Comment degrees) Supine to Sit Details (indicate cue type and reason): with rail and HOB up Sitting - Scoot to Edge of Bed: 5: Supervision Transfers Stand Pivot Transfers: 1: +2 Total assist;Patient percentage (comment);With  armrests Stand Pivot Transfer Details (indicate cue type and reason): pt 20%  Ambulation/Gait Ambulation/Gait: No (pt declined to wear prosthesis)    Exercise  Total Joint Exercises Ankle Circles/Pumps: AROM;Left;10 reps End of Session PT - End of Session Equipment Utilized During Treatment: Gait belt Activity Tolerance: Patient limited by fatigue;Patient limited by pain Patient left: in chair;with call bell in reach Nurse Communication: Need for lift equipment;Mobility status for transfers General Behavior During Session: [facility] for tasks performed Cognition: [facility] for tasks performed  [facility] LUBECK 06/12/2011, 3:32 PM

## 2011-06-12 NOTE — Progress Notes (Signed)
OT Cancellation Note  Treatment cancelled today due to medical issues with patient which prohibited therapy.  Pt. With Hbg of 7.0, and will be receiving blood.  Did speak with pt. Who had no recall of being asked to have family bring in prosthesis, and had no awareness that he needs prosthesis.  Pt. Did contact family, while therapist in room, and they will bring it in.   Will re-attempt eval tomorrow.  [place], OTR/L [id number].  Braelynn Lupton M 06/12/2011, 10:30 AM

## 2011-06-13 LAB — TYPE AND SCREEN
ABO/RH(D): O POS
Unit division: 0

## 2011-06-13 LAB — CBC
MCH: 26.4 pg (ref 26.0–34.0)
MCHC: 33.6 g/dL (ref 30.0–36.0)
Platelets: 112 10*3/uL — ABNORMAL LOW (ref 150–400)

## 2011-06-13 NOTE — Progress Notes (Signed)
Utilization review completed.  

## 2011-06-13 NOTE — Clinical Documentation Improvement (Signed)
Anemia Blood Loss Clarification  THIS DOCUMENT IS NOT A PERMANENT PART OF THE MEDICAL RECORD  RESPOND TO THE THIS QUERY, FOLLOW THE INSTRUCTIONS BELOW:  1. If needed, update documentation for the patient's encounter via the notes activity.  2. Access this query again and click edit on the In [place].  3. After updating, or not, click F2 to complete all highlighted (required) fields concerning your review. Select "additional documentation in the medical record" OR "no additional documentation provided".  4. Click Sign note button.  5. The deficiency will fall out of your In Basket *Please let us know if you are not able to complete this workflow by phone or e-mail (listed below).        06/13/11  Dear Dr. Ninfa Linden Rolley Sims  In an effort to better capture your patient's severity of illness, reflect appropriate length of stay and utilization of resources, a review of the patient medical record has revealed the following indicators.    Based on your clinical judgment, please clarify and document in a progress note and/or discharge summary the clinical condition associated with the following supporting information:  In responding to this query please exercise your independent judgment.  The fact that a query is asked, does not imply that any particular answer is desired or expected.  According to lab pt's H/H= 7.8/23.2.   Please clarify based on abnormal H/H whether or not abnormal H/H can be further specified or please state the underlying diagnosis responsible for the abn H/H.   Possible Clinical Conditions?   " Expected Acute Blood Loss Anemia  " Acute Blood Loss Anemia  " Acute on chronic blood loss anemia  " Other Condition________________  " Cannot Clinically Determine  Risk Factors: (recent surgery, pre op anemia, EBL in OR)  Supporting Information:  Signs and Symptoms (unable to ambulate, weakness, dizziness, unable to participate in  care)  Diagnostics: Component HGB HCT  Latest Ref Rng 13.0 - 17.0 g/dL 39.0 - 52.0 %  06/11/2011 8.2 (L) 24.3 (L)  06/12/2011 7.0 (L) 20.9 (L)  06/13/2011 7.8 (L) 23.2 (L)   Treatments:  IV fluids / plasma expanders: Serial H&H monitoring   Reviewed: additional documentation in the medical record  Thank You,  Heloise Beecham  RN, BSN, CCDS Clinical Documentation Specialist [place] Dept Pager: [phone] / E-mail: [email or web address]

## 2011-06-13 NOTE — Progress Notes (Signed)
Subjective: 3 Days Post-Op Procedure(s) (LRB): TOTAL KNEE REVISION (Left) OLECRANON BURSA (Left) Patient reports pain as moderate.   Hgb only up to 7.8 after transfusion. Objective: Vital signs in last 24 hours: Temp:  [98.1 F (36.7 C)-99.3 F (37.4 C)] 98.1 F (36.7 C) (02/04 0440) Pulse Rate:  [84-107] 84  (02/04 0440) Resp:  [16-20] 16  (02/04 0440) BP: (115-186)/(70-98) 115/70 mmHg (02/04 0440) SpO2:  [91 %-96 %] 91 % (02/04 0440)  Intake/Output from previous day: 02/03 0701 - 02/04 0700 In: 2279.5 [P.O.:240; I.V.:1422; Blood:617.5] Out: 1050 [Urine:1050] Intake/Output this shift:     Basename 06/13/11 0414 06/12/11 0450 06/11/11 0359  HGB 7.8* 7.0* 8.2*    Basename 06/13/11 0414 06/12/11 0450  WBC 7.6 7.7  RBC 2.96* 2.67*  HCT 23.2* 20.9*  PLT 112* 115*    Basename 06/11/11 0359  NA 141  K 3.5  CL 109  CO2 23  BUN 13  CREATININE 0.77  GLUCOSE 111*  CALCIUM 8.6   No results found for this basename: LABPT:2,INR:2 in the last 72 hours  Incision: no drainage  Assessment/Plan: 3 Days Post-Op Procedure(s) (LRB): TOTAL KNEE REVISION (Left) OLECRANON BURSA (Left) Up with therapy Re-check CBC tomorrow.  Alandis Bluemel Y 06/13/2011, 7:12 AM

## 2011-06-13 NOTE — Progress Notes (Signed)
Patient ID: [name], male   DOB: 24-Mar-1946, 66 y.o.   MRN: [id number] Transfusion was needed due to acute blood loss anemia as it relates to his surgery.

## 2011-06-13 NOTE — Progress Notes (Signed)
FL2 in shadow chart for MD signature. PT has recommended ST SNF and pt is agreeable with plan. SNF search has been initiated in [employer]. Bed offers will be provided when received. CSW will assist with d/c planning to SNF.

## 2011-06-13 NOTE — Progress Notes (Signed)
Physical Therapy Treatment Patient Details Name: [name] MRN: [id number] DOB: [birth year] Today's Date: 06/13/2011  L TKRevision POD #3 KI OOB act NO ROM L knee S/p high R AKA w/ prosthesis HgB 7.3 today  13:10 - 13:35 1 ta  1 gt  PT Assessment/Plan  PT - Assessment/Plan Comments on Treatment Session: HgB 7.8 despite trans yesterday.  Pre medicated and still c/o 9/10 L knee pain. Noted max bloody drainage on dressing after session, reported to nurse.  Pt now agreeablr to SNF. PT Plan: Discharge plan remains appropriate Follow Up Recommendations: Skilled nursing facility Equipment Recommended: Defer to next venue PT Goals  Acute Rehab PT Goals PT Goal Formulation: With patient Pt will go Supine/Side to Sit: with modified independence PT Goal: Supine/Side to Sit - Progress: Progressing toward goal Pt will go Sit to Supine/Side: with modified independence PT Goal: Sit to Supine/Side - Progress: Progressing toward goal Pt will go Sit to Stand: with min assist PT Goal: Sit to Stand - Progress: Progressing toward goal Pt will go Stand to Sit: with min assist PT Goal: Stand to Sit - Progress: Progressing toward goal Pt will Ambulate: 16 - 50 feet;with min assist;with rolling walker PT Goal: Ambulate - Progress: Progressing toward goal  PT Treatment Precautions/Restrictions  Precautions Precautions: Knee Precaution Comments: No Knee ROM Required Braces or Orthoses: Yes Knee Immobilizer: On when out of bed or walking;Discontinue once straight leg raise with < 10 degree lag Restrictions Weight Bearing Restrictions: Yes LUE Weight Bearing: Weight bearing as tolerated LLE Weight Bearing: Partial weight bearing LLE Partial Weight Bearing Percentage or Pounds: 50 Mobility (including Balance) Bed Mobility Bed Mobility: Yes Supine to Sit: 1: +2 Total assist Supine to Sit Details (indicate cue type and reason): Total assist +2 pt 75% with increased time and HOB elevated 45' Sitting  - Scoot to Edge of Bed: 1: +2 Total assist Sitting - Scoot to Edge of Bed Details (indicate cue type and reason): total assist + 2 pt 75% with increased time Transfers Transfers: Yes Sit to Stand: 1: +2 Total assist;From bed Sit to Stand Details (indicate cue type and reason): Total assist + 3 pt  <25% with 75% VC's on hand placement and 100% assist to lock R prosthetc knee to ensure PWB thru L LE Stand to Sit: 1: +2 Total assist;To chair/3-in-1 Stand to Sit Details: Total assist +3 (one person to assist with L LE advancement) pt < 25% with 75% VC's to reach back prior to sit and control decend Stand Pivot Transfers: Not tested (comment) Ambulation/Gait Ambulation/Gait: Yes Ambulation/Gait Assistance: 1: +2 Total assist Ambulation/Gait Assistance Details (indicate cue type and reason): Total assist +3 (extra assist to lock R prothetic knee during stance and advance R prosthetic leg during swing phase) pt 25% with excessive WB thru B UE thru RW.  Great difficulty supporting self with current pain level and maintaining PWB on his dominate lL LE Ambulation Distance (Feet): 2 Feet Assistive device: Rolling walker Gait Pattern: Step-to pattern Stairs: No Wheelchair Mobility Wheelchair Mobility: No   Pt's R prosthetic leg does NOT have a knee lock.  Pt states he has another prosthetic leg that does lock in the knee however "It's too heavy" and he doesn't really use it. Pt is unable to functionally amb with current prosthesis and keep PWB on L LE. Will discuss issue with LPT. Exercise    End of Session PT - End of Session Equipment Utilized During Treatment: Gait belt Activity Tolerance: Patient limited by pain;Patient  limited by fatigue Patient left: in chair;with call bell in reach Nurse Communication: Need for lift equipment General Behavior During Session: [facility] for tasks performed Cognition: [facility] for tasks performed  Rica Koyanagi  PTA [facility]  Acute  Rehab Pager     [phone]
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
## 2011-06-14 LAB — CBC
Platelets: 135 10*3/uL — ABNORMAL LOW (ref 150–400)
RDW: 18.2 % — ABNORMAL HIGH (ref 11.5–15.5)
WBC: 6.8 10*3/uL (ref 4.0–10.5)

## 2011-06-14 MED ORDER — OXYCODONE-ACETAMINOPHEN 5-325 MG PO TABS: 1.0000 | ORAL_TABLET | ORAL | Status: AC | PRN

## 2011-06-14 MED ORDER — ASPIRIN EC 325 MG PO TBEC: 325.0000 mg | DELAYED_RELEASE_TABLET | Freq: Every day | ORAL | Status: AC

## 2011-06-14 NOTE — Progress Notes (Signed)
OT Note:  Checked with pt and wife for discharge needs.  They have a standard BSC and PT practiced this transfer with a sliding board.  Wife assisted pt donning pants by sitting in the chair and leaning side to side.  No further needs identified at this time.  Will sign off.  [place], [place] [phone] 06/14/2011

## 2011-06-14 NOTE — Progress Notes (Signed)
Physical Therapy Treatment Patient Details Name: [name] MRN: [id number] DOB: [birth year] Today's Date: 06/14/2011  PT Assessment/Plan  PT - Assessment/Plan Comments on Treatment Session: Pt caregiver present to witness slide board transfers.  Caregiver asking if she could stand him for transfers and advised against it secondary to high lever of assist currently required.   PT Plan: Discharge plan remains appropriate PT Frequency: 7X/week Recommendations for Other Services: OT consult Follow Up Recommendations: Skilled nursing facility (SNF would still be optimal d.c plan - pt refusing) Equipment Recommended: Wheelchair (measurements) PT Goals  Acute Rehab PT Goals Time For Goal Achievement: 7 days Pt will go Supine/Side to Sit: with modified independence PT Goal: Supine/Side to Sit - Progress: Progressing toward goal Pt will go Sit to Supine/Side: with modified independence PT Goal: Sit to Supine/Side - Progress: Progressing toward goal Additional Goals Additional Goal #1: Slide board transfers bed to/from chair with Sup PT Goal: Additional Goal #1 - Progress: Met  PT Treatment Precautions/Restrictions  Precautions Precautions: Knee Precaution Comments: No Knee ROM Required Braces or Orthoses: Yes Knee Immobilizer: On when out of bed or walking;Discontinue once straight leg raise with < 10 degree lag Restrictions Weight Bearing Restrictions: Yes LUE Weight Bearing: Weight bearing as tolerated LLE Weight Bearing: Partial weight bearing LLE Partial Weight Bearing Percentage or Pounds: 50 Mobility (including Balance) Transfers Lateral/Scoot Transfers: 5: Supervision Lateral/Scoot Transfer Details (indicate cue type and reason): cues for proper board placement under buttock and in target chair.  Pt transferred recliner to w/c, to/from standard commode x 3, and back to recliner with min cues Ambulation/Gait Stairs:  (reviewed bump up in w/c with pt and care giver) Wheelchair  Mobility Wheelchair Mobility:  (Pt states he has utilized w/c in past without difficulty )    Exercise    End of Session PT - End of Session Equipment Utilized During Treatment: Gait belt;Sliding board;Left knee immobilizer;Right lower extremity prosthesis Activity Tolerance: Patient tolerated treatment well Patient left: in chair;with call bell in reach Nurse Communication: Mobility status for transfers;Mobility status for ambulation General Behavior During Session: [facility] for tasks performed Cognition: [facility] for tasks performed  Wilmina Maxham 06/14/2011, 3:19 PM

## 2011-06-14 NOTE — Progress Notes (Signed)
D/C instructions reviewed w/ pt and SO. All questions answered, no further questions. PT worked w/ both pt and family member on pt transfers and safety. Both pt and family verbalize comfort w/ pt returning to home. Pt awaiting delivery of w/c and slideboard. Family is going to fill pt's prescriptions and requested to be called when equipment arrives. She will then return to take pt home.

## 2011-06-14 NOTE — Progress Notes (Signed)
CSW met with pt to assist with d/c planning. Pt has declined [facility] SNF placement and plans to return home with [facility] services. SNF bed offers provided to pt in case plan changes, once at home, and SNF is needed. RNCM aware of change in d/c plan and will assist with home care needs.

## 2011-06-14 NOTE — Progress Notes (Signed)
Pt w/c and slideboard have been delivered, pt is comfortable transferring with the slideboard. Pt family here to pick pt up for ride home. Pt d/c in his w/c w/ slideboard, Pt in stable condition. Pt's SO has pt's belongings, d/c instructions, and scripts.
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
## 2011-06-14 NOTE — Progress Notes (Signed)
  CARE MANAGEMENT NOTE 06/14/2011  Patient:  [name], [name]   Account Number:  [id number]  Date Initiated:  06/11/2011  Documentation initiated by:  DAVIS,TYMEEKA  Subjective/Objective Assessment:   66 yo admitted s/p Left Total Knee Arthroplasty Revision; OLECRANON BURSA - Excision Left Elbow Olecranon Bursa. PTA lived alone with 3 adult daughters living close by.     Action/Plan:   SNF vs HH   Anticipated DC Date:  06/14/2011   Anticipated DC Plan:  [place]  In-house referral  NA      DC Planning Services  CM consult      [facility] Choice  HOME HEALTH  DURABLE MEDICAL EQUIPMENT   Choice offered to / List presented to:  C-1 Patient   DME arranged  NA      DME agency  NA     [place] arranged  [place]   Status of service:  Completed, signed off Medicare Important Message given?   (If response is "NO", the following Medicare IM given date fields will be blank) Date Medicare IM given:   Date Additional Medicare IM given:    Discharge Disposition:  [place]  Per UR Regulation:    Comments:  06/14/2011 [place] bs ccm [phone] Notified by CSW that patient has decided to go home and not go to SNF. CM spoke with patient and he states his family and friend will be caregivers. States he wants to use [place] for [facility] services because they has contacted him before he was admitted and that he had used them before. [facility] notified of patient's choice. Agency choice block changed from [facility] to [place] in focus note. Arville Go will arrange for needed DME. Start of [facility] services tomorrow 06/15/2011.  06/13/2011 [facility] Highland bsn ccm [phone] Pt changed mind after having physical therapy session and is considering ST snf/csw advised.

## 2011-06-14 NOTE — Progress Notes (Signed)
Physical Therapy Treatment Patient Details Name: [name] MRN: [id number] DOB: [birth year] Today's Date: 06/14/2011  PT Assessment/Plan  PT - Assessment/Plan PT Plan: Discharge plan remains appropriate (pt refusing SNF placement - unable to confirm 24/7 assist) PT Frequency: 7X/week Recommendations for Other Services: OT consult Follow Up Recommendations: Skilled nursing facility Equipment Recommended: Wheelchair (measurements) (sliding board) PT Goals  Acute Rehab PT Goals Time For Goal Achievement: 7 days Pt will go Supine/Side to Sit: with modified independence PT Goal: Supine/Side to Sit - Progress: Progressing toward goal Pt will go Sit to Supine/Side: with modified independence PT Goal: Sit to Supine/Side - Progress: Progressing toward goal Pt will go Sit to Stand: with min assist PT Goal: Sit to Stand - Progress: Progressing toward goal Pt will go Stand to Sit: with min assist PT Goal: Stand to Sit - Progress: Progressing toward goal Pt will Ambulate: 16 - 50 feet;with min assist;with rolling walker PT Goal: Ambulate - Progress: Discontinued (comment) Pt will Go Up / Down Stairs: 1-2 stairs;with min assist;with least restrictive assistive device;with mod assist PT Goal: Up/Down Stairs - Progress: Discontinued (comment) Additional Goals Additional Goal #1: Slide board transfers bed to/from chair with Sup PT Goal: Additional Goal #1 - Progress: Goal set today  PT Treatment Precautions/Restrictions  Precautions Precautions: Knee Precaution Comments: No Knee ROM Required Braces or Orthoses: Yes Knee Immobilizer: On when out of bed or walking;Discontinue once straight leg raise with < 10 degree lag Restrictions Weight Bearing Restrictions: Yes LUE Weight Bearing: Weight bearing as tolerated LLE Weight Bearing: Partial weight bearing LLE Partial Weight Bearing Percentage or Pounds: 50 Mobility (including Balance) Bed Mobility Supine to Sit: 5: Supervision;4: Min  assist Supine to Sit Details (indicate cue type and reason): pt self assisted L LE with UEs using KI  Sitting - Scoot to [place] of Bed: 4: Min assist;5: Supervision Sitting - Scoot to [employer] of Bed Details (indicate cue type and reason): pt self assisted L LE with UEs using KI  Transfers Sit to Stand: 1: +2 Total assist;From bed Sit to Stand Details (indicate cue type and reason): pt 25% - pt with min use of either LE to assist sit to/from stand Stand to Sit: 1: +2 Total assist Stand to Sit Details: pt 25% - pt with min use of either LE to assist sit to/from stand Lateral/Scoot Transfers: 4: Min assist;With Art gallery manager Details (indicate cue type and reason): cues for positioning on board and hand placement and for correct initial placement under buttock and in chair Ambulation/Gait Ambulation/Gait Assistance: 1: +2 Total assist Ambulation/Gait Assistance Details (indicate cue type and reason): pt 25% with min WB tolerated either LE Ambulation Distance (Feet): 4 Feet Assistive device: Rolling walker Gait Pattern: Step-to pattern Stairs: No Wheelchair Mobility Wheelchair Mobility: No    Exercise    End of Session PT - End of Session Equipment Utilized During Treatment: Gait belt;Sliding board;Left knee immobilizer;Right lower extremity prosthesis Activity Tolerance: Patient tolerated treatment well Patient left: in chair;with call bell in reach Nurse Communication: Mobility status for transfers;Mobility status for ambulation General Behavior During Session: [facility] for tasks performed Cognition: [facility] for tasks performed  Zeynep Fantroy 06/14/2011, 12:26 PM

## 2011-06-16 ENCOUNTER — Encounter (HOSPITAL_COMMUNITY): Payer: Self-pay | Admitting: Orthopaedic Surgery

## 2011-06-23 ENCOUNTER — Other Ambulatory Visit: Payer: Self-pay | Admitting: Oncology

## 2011-06-24 ENCOUNTER — Other Ambulatory Visit: Payer: Self-pay | Admitting: *Deleted

## 2011-06-24 MED ORDER — POTASSIUM CHLORIDE CRYS ER 20 MEQ PO TBCR: 20.0000 meq | EXTENDED_RELEASE_TABLET | Freq: Every day | ORAL | Status: DC

## 2011-06-24 MED ORDER — PROCHLORPERAZINE MALEATE 5 MG PO TABS: ORAL_TABLET | ORAL | Status: DC

## 2011-06-27 ENCOUNTER — Ambulatory Visit: Payer: Medicare Other | Admitting: Nurse Practitioner

## 2011-06-27 ENCOUNTER — Other Ambulatory Visit: Payer: Medicare Other | Admitting: Lab

## 2011-06-29 ENCOUNTER — Other Ambulatory Visit: Payer: Self-pay | Admitting: *Deleted

## 2011-06-29 MED ORDER — PROCHLORPERAZINE MALEATE 5 MG PO TABS: ORAL_TABLET | ORAL | Status: DC

## 2011-06-29 MED ORDER — POTASSIUM CHLORIDE CRYS ER 20 MEQ PO TBCR: 20.0000 meq | EXTENDED_RELEASE_TABLET | Freq: Every day | ORAL | Status: DC

## 2011-06-29 NOTE — Telephone Encounter (Signed)
Received fax clarification from [facility]. [facility] signed prescriptions, MD name was printed on rx. Sent rx electronically per Dr. [name].

## 2011-06-29 NOTE — Telephone Encounter (Signed)
[facility] is not an E=Rx pharmacy. Void duplicate

## 2011-07-01 ENCOUNTER — Other Ambulatory Visit: Payer: Self-pay | Admitting: *Deleted

## 2011-07-01 ENCOUNTER — Telehealth: Payer: Self-pay | Admitting: Oncology
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
## 2011-07-08 ENCOUNTER — Ambulatory Visit: Payer: Medicare Other | Admitting: Nurse Practitioner

## 2011-07-08 ENCOUNTER — Other Ambulatory Visit: Payer: Medicare Other | Admitting: Lab

## 2011-07-29 ENCOUNTER — Encounter (HOSPITAL_COMMUNITY): Payer: Self-pay | Admitting: *Deleted

## 2011-07-29 ENCOUNTER — Emergency Department (HOSPITAL_COMMUNITY): Payer: No Typology Code available for payment source

## 2011-07-29 ENCOUNTER — Emergency Department (HOSPITAL_COMMUNITY)
Admission: EM | Admit: 2011-07-29 | Discharge: 2011-07-30 | Disposition: A | Discharge status: Home / Self Care | Payer: No Typology Code available for payment source | Attending: Emergency Medicine | Admitting: Emergency Medicine

## 2011-07-29 DIAGNOSIS — M25569 Pain in unspecified knee: Secondary | ICD-10-CM | POA: Insufficient documentation

## 2011-07-29 DIAGNOSIS — M79609 Pain in unspecified limb: Secondary | ICD-10-CM | POA: Insufficient documentation

## 2011-07-29 DIAGNOSIS — M25469 Effusion, unspecified knee: Secondary | ICD-10-CM | POA: Insufficient documentation

## 2011-07-29 DIAGNOSIS — R51 Headache: Secondary | ICD-10-CM | POA: Insufficient documentation

## 2011-07-29 DIAGNOSIS — Z79899 Other long term (current) drug therapy: Secondary | ICD-10-CM | POA: Insufficient documentation

## 2011-07-29 DIAGNOSIS — I10 Essential (primary) hypertension: Secondary | ICD-10-CM | POA: Insufficient documentation

## 2011-07-29 DIAGNOSIS — S78119A Complete traumatic amputation at level between unspecified hip and knee, initial encounter: Secondary | ICD-10-CM | POA: Insufficient documentation

## 2011-07-29 DIAGNOSIS — Z862 Personal history of diseases of the blood and blood-forming organs and certain disorders involving the immune mechanism: Secondary | ICD-10-CM | POA: Insufficient documentation

## 2011-07-29 DIAGNOSIS — Z8639 Personal history of other endocrine, nutritional and metabolic disease: Secondary | ICD-10-CM | POA: Insufficient documentation

## 2011-07-29 MED ORDER — OXYCODONE-ACETAMINOPHEN 5-325 MG PO TABS
2.0000 | ORAL_TABLET | Freq: Once | ORAL | Status: AC
  Administered 2011-07-30: 2 via ORAL
  Filled 2011-07-29: qty 2

## 2011-07-29 MED ORDER — OXYCODONE-ACETAMINOPHEN 7.5-325 MG PO TABS: 1.0000 | ORAL_TABLET | ORAL | Status: AC | PRN
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
## 2011-07-29 NOTE — ED Notes (Signed)
MD at bedside. 

## 2011-08-02 ENCOUNTER — Telehealth: Payer: Self-pay | Admitting: Oncology

## 2011-08-02 NOTE — Telephone Encounter (Signed)
called pt and per Dr. [name] moved his apt time on 03/29 to 12:30pm

## 2011-08-05 ENCOUNTER — Other Ambulatory Visit (HOSPITAL_BASED_OUTPATIENT_CLINIC_OR_DEPARTMENT_OTHER): Payer: Medicare Other | Admitting: Lab

## 2011-08-05 ENCOUNTER — Ambulatory Visit (HOSPITAL_BASED_OUTPATIENT_CLINIC_OR_DEPARTMENT_OTHER): Payer: Medicare Other | Admitting: Oncology

## 2011-08-05 ENCOUNTER — Telehealth: Payer: Self-pay | Admitting: Oncology

## 2011-08-05 ENCOUNTER — Other Ambulatory Visit: Payer: Medicare Other | Admitting: Lab

## 2011-08-05 ENCOUNTER — Ambulatory Visit: Payer: Medicare Other | Admitting: Oncology

## 2011-08-05 ENCOUNTER — Telehealth: Payer: Self-pay | Admitting: *Deleted

## 2011-08-05 ENCOUNTER — Other Ambulatory Visit: Payer: Self-pay | Admitting: *Deleted

## 2011-08-05 DIAGNOSIS — C9210 Chronic myeloid leukemia, BCR/ABL-positive, not having achieved remission: Secondary | ICD-10-CM

## 2011-08-05 LAB — CBC WITH DIFFERENTIAL/PLATELET
Basophils Absolute: 0 10*3/uL (ref 0.0–0.1)
EOS%: 7.4 % — ABNORMAL HIGH (ref 0.0–7.0)
HGB: 10.7 g/dL — ABNORMAL LOW (ref 13.0–17.1)
LYMPH%: 25.2 % (ref 14.0–49.0)
MCH: 26 pg — ABNORMAL LOW (ref 27.2–33.4)
MCV: 83.2 fL (ref 79.3–98.0)
MONO%: 7.2 % (ref 0.0–14.0)
Platelets: 179 10*3/uL (ref 140–400)
RBC: 4.1 10*6/uL — ABNORMAL LOW (ref 4.20–5.82)
RDW: 17 % — ABNORMAL HIGH (ref 11.0–14.6)

## 2011-08-05 LAB — COMPREHENSIVE METABOLIC PANEL
Alkaline Phosphatase: 105 U/L (ref 39–117)
Creatinine, Ser: 1.06 mg/dL (ref 0.50–1.35)
Glucose, Bld: 78 mg/dL (ref 70–99)
Sodium: 145 mEq/L (ref 135–145)
Total Bilirubin: 0.3 mg/dL (ref 0.3–1.2)
Total Protein: 6 g/dL (ref 6.0–8.3)

## 2011-08-05 NOTE — Telephone Encounter (Signed)
Gv pt appt for may2013 

## 2011-08-05 NOTE — Telephone Encounter (Signed)
Called pt with instructions to increase potassium to twice daily. He reports he had not been taking potassium. Will recheck next week. Pt verbalized understanding.

## 2011-08-05 NOTE — Progress Notes (Signed)
OFFICE PROGRESS NOTE   INTERVAL HISTORY:   He returns as scheduled. He reports taking Gleevec approximately 5 days per week. The diarrhea has improved. He denies pain.  Objective:  Vital signs in last 24 hours:  Blood pressure 155/86, pulse 85, temperature 97.6 F (36.4 C), temperature source Oral, height 5\' 9"  (1.753 m), weight 155 lb 14.4 oz (70.716 kg).    HEENT: No thrush or ulcers Resp: Lungs clear bilaterally Cardio: Regular rate and rhythm GI: No hepatosplenomegaly, nontender Vascular:  No edema Skin: No rash    Lab Results:  Lab Results  Component Value Date   WBC 4.4 08/05/2011   HGB 10.7* 08/05/2011   HCT 34.1* 08/05/2011   MCV 83.2 08/05/2011   PLT 179 08/05/2011   ANC 2.6    Medications: I have reviewed the patient's current medications.  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 He is taking Gleevec at a dose of 200 mg daily. The peripheral blood PCR was increased in December of 2012 an improved on 05/26/2011 2. 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 3. Status post C3-C4, C4-C5, anterior cervical diskectomy and fusion with allograft and plating 09/30/2010. 4. 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 5. Depression, maintained on Lexapro. 6. Diarrhea, ? related to chronic pancreatitis versus Gleevec. He continues Imodium as needed. 7. Indurated facial skin lesion 11/20/2007 with a history of MRSA skin infection of the submental area in May 2008. The induration resolved with doxycycline. 8. Left knee arthroscopy 05/25/2007. 9. Postoperative left knee effusion/pain, likely related to gout. 10. History of gout. He continues colchicine and allopurinol. 11. Chronic pancreatitis. 12. Status post right above-the-knee amputation. 13. MRSA infection of the submental area May 2008. 14. History of tobacco, alcohol, and cocaine use. 15. History of  coronary artery disease. 16. "Shotty" lymphadenopathy of the neck, axilla, and left groin in 2009.  17. History of a microcytic anemia. He continues to have red cell microcytosis. 18. History of a right olecranon bursa lesion, ? gouty tophus. 19. 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 He previously took AndroGel. 20. 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 21. 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  22. Anorexia-potentially related to [place]. He he reports Medicaid would not pay for Megace, his weight has been stable for the past several months.   Disposition:  He is stable from a hematologic standpoint. The plan is to continue [place] for now. He has not been compliant with the [place]. We will consider a switch to [place] if the peripheral blood PCR increases. I encouraged him to take [place] daily.   Electa Sniff, MD  08/05/2011  5:32 PM

## 2011-08-09 ENCOUNTER — Telehealth: Payer: Self-pay | Admitting: Oncology

## 2011-08-09 LAB — BCR/ABL (LIO MMD)

## 2011-08-09 NOTE — Telephone Encounter (Signed)
called pt and informed that Dr. Ammie Dalton would like for him to come in on 05/03 for lab and md 05/07

## 2011-08-10 ENCOUNTER — Other Ambulatory Visit: Payer: Self-pay | Admitting: *Deleted

## 2011-08-10 ENCOUNTER — Telehealth: Payer: Self-pay | Admitting: Oncology

## 2011-08-10 DIAGNOSIS — C9210 Chronic myeloid leukemia, BCR/ABL-positive, not having achieved remission: Secondary | ICD-10-CM

## 2011-08-10 NOTE — Telephone Encounter (Signed)
called pt lmovm for lab appt for 04/05

## 2011-08-12 ENCOUNTER — Other Ambulatory Visit: Payer: Self-pay | Admitting: *Deleted

## 2011-08-12 ENCOUNTER — Other Ambulatory Visit (HOSPITAL_BASED_OUTPATIENT_CLINIC_OR_DEPARTMENT_OTHER): Payer: Medicare Other | Admitting: Lab

## 2011-08-12 DIAGNOSIS — R630 Anorexia: Secondary | ICD-10-CM

## 2011-08-12 DIAGNOSIS — C9210 Chronic myeloid leukemia, BCR/ABL-positive, not having achieved remission: Secondary | ICD-10-CM

## 2011-08-12 LAB — BASIC METABOLIC PANEL
CO2: 22 mEq/L (ref 19–32)
Chloride: 107 mEq/L (ref 96–112)
Glucose, Bld: 97 mg/dL (ref 70–99)
Potassium: 3.4 mEq/L — ABNORMAL LOW (ref 3.5–5.3)
Sodium: 144 mEq/L (ref 135–145)

## 2011-08-12 MED ORDER — MEGESTROL ACETATE 40 MG/ML PO SUSP: 200.0000 mg | Freq: Two times a day (BID) | ORAL | Status: DC

## 2011-08-25 ENCOUNTER — Ambulatory Visit: Payer: Medicare Other | Admitting: Physical Therapy

## 2011-09-09 ENCOUNTER — Other Ambulatory Visit: Payer: Self-pay | Admitting: *Deleted

## 2011-09-09 ENCOUNTER — Other Ambulatory Visit (HOSPITAL_BASED_OUTPATIENT_CLINIC_OR_DEPARTMENT_OTHER): Payer: Medicare Other | Admitting: Lab

## 2011-09-09 DIAGNOSIS — C9210 Chronic myeloid leukemia, BCR/ABL-positive, not having achieved remission: Secondary | ICD-10-CM

## 2011-09-09 LAB — CBC WITH DIFFERENTIAL/PLATELET
BASO%: 0.7 % (ref 0.0–2.0)
Basophils Absolute: 0 10*3/uL (ref 0.0–0.1)
EOS%: 3.7 % (ref 0.0–7.0)
HCT: 36.1 % — ABNORMAL LOW (ref 38.4–49.9)
HGB: 11.8 g/dL — ABNORMAL LOW (ref 13.0–17.1)
LYMPH%: 21.2 % (ref 14.0–49.0)
MCH: 26.9 pg — ABNORMAL LOW (ref 27.2–33.4)
MCHC: 32.6 g/dL (ref 32.0–36.0)
MCV: 82.5 fL (ref 79.3–98.0)
NEUT%: 65.4 % (ref 39.0–75.0)
Platelets: 204 10*3/uL (ref 140–400)

## 2011-09-09 LAB — COMPREHENSIVE METABOLIC PANEL
AST: 15 U/L (ref 0–37)
Albumin: 3.6 g/dL (ref 3.5–5.2)
Alkaline Phosphatase: 100 U/L (ref 39–117)
BUN: 11 mg/dL (ref 6–23)
Calcium: 9.1 mg/dL (ref 8.4–10.5)
Creatinine, Ser: 1.01 mg/dL (ref 0.50–1.35)
Glucose, Bld: 119 mg/dL — ABNORMAL HIGH (ref 70–99)
Potassium: 3 mEq/L — ABNORMAL LOW (ref 3.5–5.3)

## 2011-09-12 ENCOUNTER — Telehealth: Payer: Self-pay | Admitting: *Deleted

## 2011-09-12 MED ORDER — POTASSIUM CHLORIDE CRYS ER 20 MEQ PO TBCR: 20.0000 meq | EXTENDED_RELEASE_TABLET | Freq: Two times a day (BID) | ORAL | Status: DC

## 2011-09-12 NOTE — Telephone Encounter (Signed)
Message copied by Tania Ade on Mon Sep 12, 2011  6:01 PM ------      Message from: [name] B      Created: Sun Sep 11, 2011  1:40 PM       Please call patient, if taking kcl qd , increase to kcl 29meq bid

## 2011-09-12 NOTE — Telephone Encounter (Signed)
Confirmed he is taking K+ daily. Instructed him to increase to twice daily per MD. Will send refill to his pharmacy. Follow up as scheduled.

## 2011-09-13 ENCOUNTER — Ambulatory Visit: Payer: No Typology Code available for payment source | Admitting: Oncology

## 2011-09-13 ENCOUNTER — Ambulatory Visit: Payer: Medicare Other | Attending: Orthopaedic Surgery

## 2011-09-13 ENCOUNTER — Other Ambulatory Visit: Payer: No Typology Code available for payment source | Admitting: Lab

## 2011-09-13 DIAGNOSIS — M25569 Pain in unspecified knee: Secondary | ICD-10-CM | POA: Insufficient documentation

## 2011-09-13 DIAGNOSIS — IMO0001 Reserved for inherently not codable concepts without codable children: Secondary | ICD-10-CM | POA: Insufficient documentation

## 2011-09-13 DIAGNOSIS — M25669 Stiffness of unspecified knee, not elsewhere classified: Secondary | ICD-10-CM | POA: Insufficient documentation

## 2011-09-19 ENCOUNTER — Ambulatory Visit: Payer: Medicare Other | Admitting: Physical Therapy

## 2011-09-21 ENCOUNTER — Telehealth: Payer: Self-pay | Admitting: *Deleted

## 2011-09-21 NOTE — Telephone Encounter (Signed)
Inquired of patient if he has been consistent in his Gleevec 200 mg daily and he reports he missed 7-10 days of the medication 2-3 weeks ago. He had gotten depressed and stopped taking all his medications. He is back on track now. Reports this has been the only time he has not taken the [place] in the past several months.  Will relay info to MD.

## 2011-09-21 NOTE — Telephone Encounter (Signed)
Message copied by Tania Ade on Wed Sep 21, 2011  4:19 PM ------      Message from: [name] B      Created: Wed Sep 21, 2011  7:06 AM       Please call patient, bcr/abl is higher, ? Is he taking the gleevec, ?consistently.  If yes, we will need to switch to a different drug.            Be sure he has a f/u appt. Next few weeks

## 2011-09-23 ENCOUNTER — Telehealth: Payer: Self-pay | Admitting: *Deleted

## 2011-09-23 ENCOUNTER — Telehealth: Payer: Self-pay | Admitting: Oncology

## 2011-09-23 DIAGNOSIS — C9510 Chronic leukemia of unspecified cell type not having achieved remission: Secondary | ICD-10-CM

## 2011-09-23 NOTE — Telephone Encounter (Signed)
lmonvm adviisng the pt of his July 2013 appts

## 2011-09-23 NOTE — Telephone Encounter (Signed)
Per Dr. [name]: Take Gleevec consistently and recheck lab in 1 month. Patient notified and agrees to plan.

## 2011-09-26 ENCOUNTER — Ambulatory Visit: Payer: Medicare Other

## 2011-09-28 ENCOUNTER — Ambulatory Visit: Payer: Medicare Other

## 2011-10-10 ENCOUNTER — Ambulatory Visit: Payer: Medicare Other | Attending: Orthopaedic Surgery

## 2011-10-10 DIAGNOSIS — IMO0001 Reserved for inherently not codable concepts without codable children: Secondary | ICD-10-CM | POA: Insufficient documentation

## 2011-10-10 DIAGNOSIS — M25669 Stiffness of unspecified knee, not elsewhere classified: Secondary | ICD-10-CM | POA: Insufficient documentation

## 2011-10-10 DIAGNOSIS — M25569 Pain in unspecified knee: Secondary | ICD-10-CM | POA: Insufficient documentation

## 2011-10-12 ENCOUNTER — Ambulatory Visit: Payer: Medicare Other

## 2011-10-17 ENCOUNTER — Ambulatory Visit: Payer: Medicare Other

## 2011-10-24 ENCOUNTER — Ambulatory Visit: Payer: Medicare Other

## 2011-10-27 ENCOUNTER — Encounter: Payer: No Typology Code available for payment source | Admitting: Physical Therapy

## 2011-10-27 ENCOUNTER — Ambulatory Visit: Payer: No Typology Code available for payment source | Admitting: Oncology

## 2011-11-03 ENCOUNTER — Ambulatory Visit: Payer: Medicare Other

## 2011-11-07 ENCOUNTER — Telehealth: Payer: Self-pay | Admitting: Oncology

## 2011-11-07 ENCOUNTER — Ambulatory Visit (HOSPITAL_BASED_OUTPATIENT_CLINIC_OR_DEPARTMENT_OTHER): Payer: Medicare Other | Admitting: Oncology

## 2011-11-07 ENCOUNTER — Other Ambulatory Visit (HOSPITAL_BASED_OUTPATIENT_CLINIC_OR_DEPARTMENT_OTHER): Payer: Medicare Other

## 2011-11-07 VITALS — BP 159/87 | HR 68 | Temp 98.3°F | Ht 69.0 in | Wt 157.6 lb

## 2011-11-07 DIAGNOSIS — C9210 Chronic myeloid leukemia, BCR/ABL-positive, not having achieved remission: Secondary | ICD-10-CM

## 2011-11-07 DIAGNOSIS — C9211 Chronic myeloid leukemia, BCR/ABL-positive, in remission: Secondary | ICD-10-CM

## 2011-11-07 DIAGNOSIS — K861 Other chronic pancreatitis: Secondary | ICD-10-CM

## 2011-11-07 DIAGNOSIS — C9510 Chronic leukemia of unspecified cell type not having achieved remission: Secondary | ICD-10-CM

## 2011-11-07 DIAGNOSIS — M109 Gout, unspecified: Secondary | ICD-10-CM
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
## 2011-11-07 NOTE — Telephone Encounter (Signed)
appts made and printed for pt aom °

## 2011-11-08 LAB — CBC WITH DIFFERENTIAL/PLATELET
Basophils Absolute: 0 10*3/uL (ref 0.0–0.1)
Eosinophils Absolute: 0.3 10*3/uL (ref 0.0–0.5)
HGB: 12.2 g/dL — ABNORMAL LOW (ref 13.0–17.1)
MCV: 78.5 fL — ABNORMAL LOW (ref 79.3–98.0)
MONO#: 0.4 10*3/uL (ref 0.1–0.9)
NEUT#: 2.6 10*3/uL (ref 1.5–6.5)
Platelets: 167 10*3/uL (ref 140–400)
RBC: 4.7 10*6/uL (ref 4.20–5.82)
RDW: 16.1 % — ABNORMAL HIGH (ref 11.0–14.6)
WBC: 4.8 10*3/uL (ref 4.0–10.3)
nRBC: 0 % (ref 0–0)

## 2011-11-14 ENCOUNTER — Ambulatory Visit: Payer: Medicare Other | Attending: Orthopaedic Surgery

## 2011-11-14 DIAGNOSIS — IMO0001 Reserved for inherently not codable concepts without codable children: Secondary | ICD-10-CM | POA: Insufficient documentation

## 2011-11-14 DIAGNOSIS — M25669 Stiffness of unspecified knee, not elsewhere classified: Secondary | ICD-10-CM | POA: Insufficient documentation

## 2011-11-14 DIAGNOSIS — M25569 Pain in unspecified knee: Secondary | ICD-10-CM | POA: Insufficient documentation

## 2011-11-16 ENCOUNTER — Ambulatory Visit: Payer: Medicare Other

## 2011-11-21 ENCOUNTER — Ambulatory Visit: Payer: Medicare Other

## 2011-11-23 ENCOUNTER — Encounter: Payer: No Typology Code available for payment source | Admitting: Rehabilitation

## 2011-11-24 ENCOUNTER — Other Ambulatory Visit: Payer: Self-pay | Admitting: Oncology

## 2011-11-24 DIAGNOSIS — F329 Major depressive disorder, single episode, unspecified: Secondary | ICD-10-CM

## 2011-11-24 DIAGNOSIS — C9210 Chronic myeloid leukemia, BCR/ABL-positive, not having achieved remission: Secondary | ICD-10-CM

## 2011-11-28 ENCOUNTER — Encounter: Payer: No Typology Code available for payment source | Admitting: Rehabilitation

## 2011-11-28 ENCOUNTER — Ambulatory Visit: Payer: Medicare Other | Admitting: Physical Therapy

## 2011-11-30 ENCOUNTER — Ambulatory Visit: Payer: Medicare Other | Admitting: Physical Therapy

## 2011-11-30 ENCOUNTER — Encounter: Payer: No Typology Code available for payment source | Admitting: Rehabilitation

## 2011-12-02 ENCOUNTER — Ambulatory Visit: Payer: No Typology Code available for payment source | Admitting: Physical Therapy

## 2011-12-05 ENCOUNTER — Ambulatory Visit: Payer: Medicare Other | Admitting: Physical Therapy

## 2011-12-07 ENCOUNTER — Ambulatory Visit: Payer: Medicare Other | Admitting: Physical Therapy

## 2011-12-08 ENCOUNTER — Ambulatory Visit: Payer: No Typology Code available for payment source | Admitting: Nurse Practitioner

## 2011-12-08 ENCOUNTER — Other Ambulatory Visit: Payer: No Typology Code available for payment source | Admitting: Lab

## 2011-12-08 ENCOUNTER — Ambulatory Visit: Payer: Medicare Other | Admitting: Physical Therapy

## 2011-12-08 ENCOUNTER — Other Ambulatory Visit: Payer: Self-pay | Admitting: *Deleted

## 2011-12-13 ENCOUNTER — Telehealth: Payer: Self-pay | Admitting: Oncology

## 2011-12-13 NOTE — Telephone Encounter (Signed)
Pt called today to r/s 8/1 appt to 8/13.

## 2011-12-14 ENCOUNTER — Ambulatory Visit: Payer: Medicare Other | Attending: Orthopaedic Surgery | Admitting: *Deleted

## 2011-12-14 DIAGNOSIS — M25569 Pain in unspecified knee: Secondary | ICD-10-CM | POA: Insufficient documentation

## 2011-12-14 DIAGNOSIS — IMO0001 Reserved for inherently not codable concepts without codable children: Secondary | ICD-10-CM | POA: Insufficient documentation

## 2011-12-14 DIAGNOSIS — M25669 Stiffness of unspecified knee, not elsewhere classified: Secondary | ICD-10-CM | POA: Insufficient documentation

## 2011-12-16 ENCOUNTER — Ambulatory Visit: Payer: Medicare Other | Admitting: *Deleted

## 2011-12-19 ENCOUNTER — Ambulatory Visit: Payer: Medicare Other | Admitting: *Deleted

## 2011-12-20 ENCOUNTER — Ambulatory Visit (HOSPITAL_BASED_OUTPATIENT_CLINIC_OR_DEPARTMENT_OTHER): Payer: Medicare Other | Admitting: Nurse Practitioner

## 2011-12-20 ENCOUNTER — Other Ambulatory Visit (HOSPITAL_BASED_OUTPATIENT_CLINIC_OR_DEPARTMENT_OTHER): Payer: Medicare Other | Admitting: Lab

## 2011-12-20 VITALS — BP 162/94 | HR 71 | Temp 97.5°F | Resp 20 | Ht 69.0 in | Wt 159.6 lb

## 2011-12-20 DIAGNOSIS — D649 Anemia, unspecified: Secondary | ICD-10-CM

## 2011-12-20 DIAGNOSIS — C9210 Chronic myeloid leukemia, BCR/ABL-positive, not having achieved remission: Secondary | ICD-10-CM

## 2011-12-20 DIAGNOSIS — F329 Major depressive disorder, single episode, unspecified: Secondary | ICD-10-CM

## 2011-12-20 DIAGNOSIS — K861 Other chronic pancreatitis: Secondary | ICD-10-CM

## 2011-12-20 LAB — COMPREHENSIVE METABOLIC PANEL
ALT: 12 U/L (ref 0–53)
BUN: 13 mg/dL (ref 6–23)
CO2: 26 mEq/L (ref 19–32)
Calcium: 8.9 mg/dL (ref 8.4–10.5)
Chloride: 108 mEq/L (ref 96–112)
Creatinine, Ser: 1.11 mg/dL (ref 0.50–1.35)
Glucose, Bld: 103 mg/dL — ABNORMAL HIGH (ref 70–99)

## 2011-12-20 LAB — CBC WITH DIFFERENTIAL/PLATELET
Basophils Absolute: 0.1 10*3/uL (ref 0.0–0.1)
Eosinophils Absolute: 0.3 10*3/uL (ref 0.0–0.5)
HCT: 36.8 % — ABNORMAL LOW (ref 38.4–49.9)
HGB: 11.9 g/dL — ABNORMAL LOW (ref 13.0–17.1)
LYMPH%: 27.7 % (ref 14.0–49.0)
MONO#: 0.3 10*3/uL (ref 0.1–0.9)
NEUT%: 55.8 % (ref 39.0–75.0)
Platelets: 128 10*3/uL — ABNORMAL LOW (ref 140–400)
WBC: 4.3 10*3/uL (ref 4.0–10.3)
lymph#: 1.2 10*3/uL (ref 0.9–3.3)

## 2011-12-20 NOTE — Progress Notes (Signed)
OFFICE PROGRESS NOTE  Interval history:  Mr. Boettcher returns after missing a recent followup visits. He reports he is taking the [place] consistently with only a few missed doses. Diarrhea is controlled with Lomotil. No skin rash. He continues to note intermittent tearing and brown eye discharge. Symptoms tend to be present in the morning. Appetite varies. He is intermittently fatigued.   Objective: Blood pressure 162/94, pulse 71, temperature 97.5 F (36.4 C), temperature source Oral, resp. rate 20, height 5\' 9"  (1.753 m), weight 159 lb 9.6 oz (72.394 kg).  Conjunctiva without erythema. No periorbital edema. Oropharynx is without thrush or ulceration. Lungs are clear. Regular cardiac rhythm. Abdomen soft and nontender. No splenomegaly. No left leg edema. No skin rash.  Lab Results: Lab Results  Component Value Date   WBC 4.3 12/20/2011   HGB 11.9* 12/20/2011   HCT 36.8* 12/20/2011   MCV 80.5 12/20/2011   PLT 128* 12/20/2011    Chemistry:    Chemistry      Component Value Date/Time   NA 143 09/09/2011 1600   K 3.0* 09/09/2011 1600   CL 106 09/09/2011 1600   CO2 25 09/09/2011 1600   BUN 11 09/09/2011 1600   CREATININE 1.01 09/09/2011 1600      Component Value Date/Time   CALCIUM 9.1 09/09/2011 1600   ALKPHOS 100 09/09/2011 1600   AST 15 09/09/2011 1600   ALT 10 09/09/2011 1600   BILITOT 0.3 09/09/2011 1600       Studies/Results: No results found.  Medications: I have reviewed the patient's current medications.  Assessment/Plan:  1. Chronic myelogenous leukemia, diagnosed in January of 2009. He remains in hematologic remission. He is taking Gleevec at a dose of 200 mg daily. The peripheral blood PCR was markedly increased in May 2013, likely reflecting medical noncompliance. The peripheral blood PCR was significantly improved on 11/07/2011. 2. 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 3. Status post C3-C4, C4-C5, anterior cervical diskectomy and fusion with allograft and plating  09/30/2010. 4. 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 5. Depression, maintained on Lexapro. 6. Diarrhea, ? related to chronic pancreatitis versus Gleevec. He continues Imodium/Lomotil as needed. Improved. 7. Indurated facial skin lesion 11/20/2007 with a history of MRSA skin infection of the submental area in May 2008. The induration resolved with doxycycline. 8. Left knee arthroscopy 05/25/2007. 9. Postoperative left knee effusion/pain, likely related to gout. 10. History of gout. He continues colchicine and allopurinol. 11. Chronic pancreatitis. 12. Status post right above-the-knee amputation. 13. MRSA infection of the submental area May 2008. 14. History of tobacco, alcohol, and cocaine use. 15. History of coronary artery disease. 16. "Shotty" lymphadenopathy of the neck, axilla, and left groin in 2009.  17. History of a microcytic anemia. He continues to have red cell microcytosis. 18. History of a right olecranon bursa lesion, ? gouty tophus. 19. 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 He previously took AndroGel. 20. History of Anemia secondary to chronic disease and Gleevec therapy. 21. 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  22. Anorexia-potentially related to [place]. He reports Medicaid would not pay for Megace. He continues to gain weight. 23. Eye symptoms with tearing/discharge. Question etiology.  Disposition-Mr. Feenstra appears stable. He continues Gleevec 200 mg daily. The peripheral blood PCR was improved on 11/07/2011. He will return for a followup visit and repeat peripheral blood PCR in 6 weeks. He will contact the office in the interim with any problems.  Plan reviewed  with Dr. [name].  [name] ANP/GNP-BC

## 2011-12-22 ENCOUNTER — Telehealth: Payer: Self-pay | Admitting: Oncology

## 2011-12-22 ENCOUNTER — Ambulatory Visit: Payer: Medicare Other | Admitting: *Deleted

## 2011-12-22 NOTE — Telephone Encounter (Signed)
S/w the pt and he is aware of his sept appts with dr [name]

## 2011-12-27 ENCOUNTER — Ambulatory Visit: Payer: Medicare Other | Admitting: Physical Therapy

## 2012-01-23 ENCOUNTER — Other Ambulatory Visit: Payer: Self-pay | Admitting: Oncology

## 2012-01-24 ENCOUNTER — Other Ambulatory Visit: Payer: Self-pay | Admitting: *Deleted

## 2012-01-24 DIAGNOSIS — C9210 Chronic myeloid leukemia, BCR/ABL-positive, not having achieved remission: Secondary | ICD-10-CM

## 2012-01-24 MED ORDER — IMATINIB MESYLATE 100 MG PO TABS: 200.0000 mg | ORAL_TABLET | Freq: Every day | ORAL | Status: DC

## 2012-01-24 NOTE — Telephone Encounter (Signed)
Refill request for gleevec. Scheduled for follow up visit later this month. Will approve refill X 1 to ensure he keeps appointment.

## 2012-01-27 DIAGNOSIS — C9210 Chronic myeloid leukemia, BCR/ABL-positive, not having achieved remission: Secondary | ICD-10-CM | POA: Insufficient documentation

## 2012-01-31 ENCOUNTER — Other Ambulatory Visit (HOSPITAL_BASED_OUTPATIENT_CLINIC_OR_DEPARTMENT_OTHER): Payer: Medicare Other | Admitting: Lab

## 2012-01-31 ENCOUNTER — Ambulatory Visit (HOSPITAL_BASED_OUTPATIENT_CLINIC_OR_DEPARTMENT_OTHER): Payer: Medicare Other | Admitting: Oncology

## 2012-01-31 VITALS — BP 148/97 | HR 78 | Temp 99.2°F | Resp 20 | Ht 69.0 in | Wt 153.6 lb

## 2012-01-31 DIAGNOSIS — M24662 Ankylosis, left knee: Secondary | ICD-10-CM

## 2012-01-31 DIAGNOSIS — C9210 Chronic myeloid leukemia, BCR/ABL-positive, not having achieved remission: Secondary | ICD-10-CM

## 2012-01-31 DIAGNOSIS — R197 Diarrhea, unspecified: Secondary | ICD-10-CM

## 2012-01-31 DIAGNOSIS — D509 Iron deficiency anemia, unspecified: Secondary | ICD-10-CM

## 2012-01-31 LAB — CBC WITH DIFFERENTIAL/PLATELET
BASO%: 0.9 % (ref 0.0–2.0)
Eosinophils Absolute: 0.4 10*3/uL (ref 0.0–0.5)
HCT: 40.8 % (ref 38.4–49.9)
MCHC: 32.4 g/dL (ref 32.0–36.0)
MONO#: 0.4 10*3/uL (ref 0.1–0.9)
NEUT#: 2.4 10*3/uL (ref 1.5–6.5)
NEUT%: 54.5 % (ref 39.0–75.0)
WBC: 4.4 10*3/uL (ref 4.0–10.3)
lymph#: 1.2 10*3/uL (ref 0.9–3.3)

## 2012-01-31 LAB — COMPREHENSIVE METABOLIC PANEL (CC13)
AST: 26 U/L (ref 5–34)
Albumin: 3.8 g/dL (ref 3.5–5.0)
BUN: 15 mg/dL (ref 7.0–26.0)
CO2: 24 mEq/L (ref 22–29)
Calcium: 9.5 mg/dL (ref 8.4–10.4)
Chloride: 108 mEq/L — ABNORMAL HIGH (ref 98–107)
Creatinine: 1.1 mg/dL (ref 0.7–1.3)
Glucose: 92 mg/dl (ref 70–99)
Potassium: 3.8 mEq/L (ref 3.5–5.1)

## 2012-01-31 MED ORDER — OXYCODONE-ACETAMINOPHEN 5-325 MG PO TABS: 1.0000 | ORAL_TABLET | Freq: Three times a day (TID) | ORAL | Status: DC | PRN

## 2012-01-31 MED ORDER — PROCHLORPERAZINE MALEATE 5 MG PO TABS: 5.0000 mg | ORAL_TABLET | Freq: Four times a day (QID) | ORAL | Status: DC | PRN

## 2012-01-31 NOTE — Progress Notes (Signed)
   [facility]    OFFICE PROGRESS NOTE   INTERVAL HISTORY:   He returns as scheduled. He reports significant improvement in the diarrhea with Lomotil and the 200 mg Gleevec dose. No abdominal pain. Nausea has improved with Protonix. He complains of anorexia. He has pain at the left knee and dorsum of the left foot. The pain is chronic.  Objective:  Vital signs in last 24 hours:  Blood pressure 148/97, pulse 78, temperature 99.2 F (37.3 C), temperature source Oral, resp. rate 20, height 5\' 9"  (1.753 m), weight 153 lb 9.6 oz (69.673 kg).    HEENT: No thrush or ulcers Lymphatics: "Shotty "bilateral posterior cervical nodes Resp: Lungs clear bilaterally Cardio: Regular rate and rhythm GI: No hepatosplenomegaly, nontender, no mass Vascular: No left leg edema Musculoskeletal: There is a nodule overlying the tendon at the dorsum of the left foot     Lab Results:  Lab Results  Component Value Date   WBC 4.4 01/31/2012   HGB 13.2 01/31/2012   HCT 40.8 01/31/2012   MCV 81.2 01/31/2012   PLT 145 01/31/2012   ANC 2.4    Medications: I have reviewed the patient's current medications.  Assessment/Plan: 1. Chronic myelogenous leukemia, diagnosed in January of 2009. He remains in hematologic remission. He is taking Gleevec at a dose of 200 mg daily. The peripheral blood PCR was markedly increased in May 2013, likely reflecting medical noncompliance. The peripheral blood PCR was significantly improved on 11/07/2011. 2. 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 3. Status post C3-C4, C4-C5, anterior cervical diskectomy and fusion with allograft and plating 09/30/2010. 4. 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 5. Depression, maintained on Lexapro. 6. Diarrhea, ? related to chronic pancreatitis versus Gleevec. He continues Imodium/Lomotil as needed. Improved. 7. Indurated facial skin lesion 11/20/2007  with a history of MRSA skin infection of the submental area in May 2008. The induration resolved with doxycycline. 8. Left knee arthroscopy 05/25/2007. 9. Postoperative left knee effusion/pain, likely related to gout. 10. History of gout. He continues colchicine and allopurinol. 11. Chronic pancreatitis. 12. Status post right above-the-knee amputation. 13. MRSA infection of the submental area May 2008. 14. History of tobacco, alcohol, and cocaine use. 15. History of coronary artery disease. 16. "Shotty" lymphadenopathy of the neck, axilla, and left groin in 2009.  17. History of a microcytic anemia. He continues to have red cell microcytosis. 18. History of a right olecranon bursa lesion, ? gouty tophus. 19. 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 He previously took AndroGel. 20. History of Anemia secondary to chronic disease and Gleevec therapy.  21. Anorexia-potentially related to [place]. He reports Medicaid would not pay for Megace.   Disposition:  He appears stable. We will followup on the peripheral blood PCR from today. We again discussed switching from [place] to [place]. He is hesitant to make a change due to the potential toxicities.  Mr. Terrilee Files will see Dr. Ninfa Linden for the left knee pain and to evaluate the nodular area at the dorsum of the left foot. This appears to be a tendon sheath nodule.  He will return for an office visit here in 6 weeks.   [name], MD  01/31/2012  3:46 PM

## 2012-02-02 ENCOUNTER — Telehealth: Payer: Self-pay | Admitting: Oncology

## 2012-02-02 NOTE — Telephone Encounter (Signed)
s.w. pt and advise on NOV 5th appt....sed

## 2012-02-27 ENCOUNTER — Other Ambulatory Visit: Payer: Self-pay | Admitting: *Deleted

## 2012-02-27 DIAGNOSIS — C9210 Chronic myeloid leukemia, BCR/ABL-positive, not having achieved remission: Secondary | ICD-10-CM

## 2012-02-27 MED ORDER — IMATINIB MESYLATE 100 MG PO TABS: 200.0000 mg | ORAL_TABLET | Freq: Every day | ORAL | Status: DC

## 2012-02-27 NOTE — Telephone Encounter (Signed)
VM from patient-out of Gleevec

## 2012-03-08 ENCOUNTER — Other Ambulatory Visit: Payer: Self-pay | Admitting: *Deleted

## 2012-03-08 DIAGNOSIS — C9210 Chronic myeloid leukemia, BCR/ABL-positive, not having achieved remission: Secondary | ICD-10-CM

## 2012-03-08 DIAGNOSIS — M24662 Ankylosis, left knee: Secondary | ICD-10-CM

## 2012-03-08 MED ORDER — PROCHLORPERAZINE MALEATE 5 MG PO TABS: 5.0000 mg | ORAL_TABLET | Freq: Four times a day (QID) | ORAL | Status: DC | PRN

## 2012-03-08 MED ORDER — POTASSIUM CHLORIDE CRYS ER 20 MEQ PO TBCR: 20.0000 meq | EXTENDED_RELEASE_TABLET | Freq: Two times a day (BID) | ORAL | Status: DC

## 2012-03-13 ENCOUNTER — Other Ambulatory Visit (HOSPITAL_BASED_OUTPATIENT_CLINIC_OR_DEPARTMENT_OTHER): Payer: Medicare Other | Admitting: Lab

## 2012-03-13 ENCOUNTER — Ambulatory Visit (HOSPITAL_BASED_OUTPATIENT_CLINIC_OR_DEPARTMENT_OTHER): Payer: Medicare Other | Admitting: Nurse Practitioner

## 2012-03-13 VITALS — BP 137/86 | HR 100 | Temp 98.6°F | Resp 20 | Ht 69.0 in | Wt 152.3 lb

## 2012-03-13 DIAGNOSIS — C9210 Chronic myeloid leukemia, BCR/ABL-positive, not having achieved remission: Secondary | ICD-10-CM

## 2012-03-13 DIAGNOSIS — M24662 Ankylosis, left knee: Secondary | ICD-10-CM

## 2012-03-13 DIAGNOSIS — M24669 Ankylosis, unspecified knee: Secondary | ICD-10-CM

## 2012-03-13 LAB — CBC WITH DIFFERENTIAL/PLATELET
Basophils Absolute: 0 10*3/uL (ref 0.0–0.1)
Eosinophils Absolute: 0.2 10*3/uL (ref 0.0–0.5)
HCT: 39.4 % (ref 38.4–49.9)
HGB: 13.1 g/dL (ref 13.0–17.1)
LYMPH%: 25.6 % (ref 14.0–49.0)
MCV: 83.1 fL (ref 79.3–98.0)
MONO%: 7.8 % (ref 0.0–14.0)
NEUT#: 3.1 10*3/uL (ref 1.5–6.5)
NEUT%: 61.3 % (ref 39.0–75.0)
Platelets: 174 10*3/uL (ref 140–400)

## 2012-03-13 LAB — COMPREHENSIVE METABOLIC PANEL (CC13)
Albumin: 3.8 g/dL (ref 3.5–5.0)
Alkaline Phosphatase: 104 U/L (ref 40–150)
BUN: 9 mg/dL (ref 7.0–26.0)
Glucose: 91 mg/dl (ref 70–99)
Potassium: 3.5 mEq/L (ref 3.5–5.1)
Total Bilirubin: 0.34 mg/dL (ref 0.20–1.20)

## 2012-03-13 MED ORDER — OXYCODONE-ACETAMINOPHEN 5-325 MG PO TABS: 1.0000 | ORAL_TABLET | Freq: Three times a day (TID) | ORAL | Status: DC | PRN

## 2012-03-13 MED ORDER — DIPHENOXYLATE-ATROPINE 2.5-0.025 MG PO TABS: 1.0000 | ORAL_TABLET | Freq: Four times a day (QID) | ORAL | Status: DC | PRN

## 2012-03-13 NOTE — Progress Notes (Signed)
OFFICE PROGRESS NOTE  Interval history:  [name] returns as scheduled. He continues Gleevec 200 mg daily. He continues to have intermittent diarrhea. He takes Lomotil as needed. He has occasional nausea/vomiting. He does not have nausea on a consistent basis. No skin rash. He continues to have pain at the left knee and left foot. He takes Percocet as needed.   Objective: Blood pressure 137/86, pulse 100, temperature 98.6 F (37 C), temperature source Oral, resp. rate 20, height 5\' 9"  (1.753 m), weight 152 lb 4.8 oz (69.083 kg).  Oropharynx is without thrush or ulceration. Shotty bilateral posterior cervical lymph nodes. Lungs are clear. Regular cardiac rhythm. Abdomen soft and nontender. No organomegaly. Left leg is without edema. Nodule overlying the tendon of the dorsum of the left foot.  Lab Results: Lab Results  Component Value Date   WBC 5.1 03/13/2012   HGB 13.1 03/13/2012   HCT 39.4 03/13/2012   MCV 83.1 03/13/2012   PLT 174 03/13/2012    Chemistry:    Chemistry      Component Value Date/Time   NA 142 01/31/2012 1426   NA 141 12/20/2011 1340   K 3.8 01/31/2012 1426   K 3.5 12/20/2011 1340   CL 108* 01/31/2012 1426   CL 108 12/20/2011 1340   CO2 24 01/31/2012 1426   CO2 26 12/20/2011 1340   BUN 15.0 01/31/2012 1426   BUN 13 12/20/2011 1340   CREATININE 1.1 01/31/2012 1426   CREATININE 1.11 12/20/2011 1340      Component Value Date/Time   CALCIUM 9.5 01/31/2012 1426   CALCIUM 8.9 12/20/2011 1340   ALKPHOS 94 01/31/2012 1426   ALKPHOS 79 12/20/2011 1340   AST 26 01/31/2012 1426   AST 16 12/20/2011 1340   ALT 21 01/31/2012 1426   ALT 12 12/20/2011 1340   BILITOT 0.40 01/31/2012 1426   BILITOT 0.4 12/20/2011 1340       Studies/Results: No results found.  Medications: I have reviewed the patient's current medications.  Assessment/Plan:  1. Chronic myelogenous leukemia, diagnosed in January of 2009. He remains in hematologic remission. He is taking Gleevec at a dose of 200 mg  daily. The peripheral blood PCR was markedly increased in May 2013, likely reflecting medical noncompliance. The peripheral blood PCR was significantly improved on 11/07/2011. The peripheral blood PCR was further improved on 01/31/2012. 2. 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 3. Status post C3-C4, C4-C5, anterior cervical diskectomy and fusion with allograft and plating 09/30/2010. 4. 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 5. Depression, maintained on Lexapro. 6. Diarrhea, ? related to chronic pancreatitis versus Gleevec. He takes Lomotil as needed. Improved. 7. Indurated facial skin lesion 11/20/2007 with a history of MRSA skin infection of the submental area in May 2008. The induration resolved with doxycycline. 8. Left knee arthroscopy 05/25/2007. 9. Postoperative left knee effusion/pain, likely related to gout. 10. History of gout. He continues colchicine and allopurinol. 11. Chronic pancreatitis. 12. Status post right above-the-knee amputation. 13. MRSA infection of the submental area May 2008. 14. History of tobacco, alcohol, and cocaine use. 15. History of coronary artery disease. 16. "Shotty" lymphadenopathy of the neck, axilla, and left groin in 2009.  17. History of a microcytic anemia. He continues to have red cell microcytosis. 18. History of a right olecranon bursa lesion, ? gouty tophus. 19. 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 He previously took AndroGel. 20. History of anemia secondary to chronic disease and  [place] therapy.  21. Anorexia-potentially related to [place]. He reports Medicaid would not pay for Megace. His weight is stable. 22. Chronic left knee and left foot pain. He takes Percocet as needed. We refilled the Percocet prescription today.  Disposition-[name] appears stable from a hematologic standpoint. He will continue Gleevec 200 mg daily. He will return for a followup visit and  peripheral blood PCR in 6 weeks. He will contact the office in the interim with any problems.  Plan reviewed with Dr. [name].  [name] ANP/GNP-BC

## 2012-03-15 ENCOUNTER — Telehealth: Payer: Self-pay | Admitting: *Deleted

## 2012-03-15 NOTE — Telephone Encounter (Signed)
Patient confirmed over the phone the new date and time on 04-27-2012 starting at 3:00pm

## 2012-04-03 ENCOUNTER — Other Ambulatory Visit: Payer: Self-pay | Admitting: Medical Oncology

## 2012-04-03 DIAGNOSIS — C9210 Chronic myeloid leukemia, BCR/ABL-positive, not having achieved remission: Secondary | ICD-10-CM

## 2012-04-03 MED ORDER — IMATINIB MESYLATE 100 MG PO TABS: 200.0000 mg | ORAL_TABLET | Freq: Every day | ORAL | Status: DC

## 2012-04-03 NOTE — Telephone Encounter (Signed)
[place] refill faxed to [facility]

## 2012-04-17 ENCOUNTER — Other Ambulatory Visit: Payer: Self-pay | Admitting: Oncology

## 2012-04-27 ENCOUNTER — Encounter: Payer: Self-pay | Admitting: *Deleted

## 2012-04-27 ENCOUNTER — Ambulatory Visit (HOSPITAL_BASED_OUTPATIENT_CLINIC_OR_DEPARTMENT_OTHER): Payer: Medicare Other | Admitting: Oncology

## 2012-04-27 ENCOUNTER — Other Ambulatory Visit (HOSPITAL_BASED_OUTPATIENT_CLINIC_OR_DEPARTMENT_OTHER): Payer: Medicare Other | Admitting: Lab

## 2012-04-27 VITALS — BP 160/82 | HR 77 | Temp 98.3°F | Resp 20 | Ht 69.0 in | Wt 157.4 lb

## 2012-04-27 DIAGNOSIS — C9210 Chronic myeloid leukemia, BCR/ABL-positive, not having achieved remission: Secondary | ICD-10-CM

## 2012-04-27 DIAGNOSIS — M24662 Ankylosis, left knee: Secondary | ICD-10-CM

## 2012-04-27 DIAGNOSIS — M25569 Pain in unspecified knee: Secondary | ICD-10-CM

## 2012-04-27 DIAGNOSIS — D509 Iron deficiency anemia, unspecified: Secondary | ICD-10-CM

## 2012-04-27 DIAGNOSIS — F329 Major depressive disorder, single episode, unspecified: Secondary | ICD-10-CM

## 2012-04-27 LAB — CBC WITH DIFFERENTIAL/PLATELET
BASO%: 0.9 % (ref 0.0–2.0)
Basophils Absolute: 0 10*3/uL (ref 0.0–0.1)
EOS%: 5.4 % (ref 0.0–7.0)
HCT: 37.1 % — ABNORMAL LOW (ref 38.4–49.9)
HGB: 12 g/dL — ABNORMAL LOW (ref 13.0–17.1)
LYMPH%: 25.3 % (ref 14.0–49.0)
MCH: 27.5 pg (ref 27.2–33.4)
MCHC: 32.5 g/dL (ref 32.0–36.0)
MCV: 84.7 fL (ref 79.3–98.0)
MONO%: 6.8 % (ref 0.0–14.0)
NEUT%: 61.6 % (ref 39.0–75.0)
Platelets: 131 10*3/uL — ABNORMAL LOW (ref 140–400)

## 2012-04-27 LAB — COMPREHENSIVE METABOLIC PANEL (CC13)
ALT: 9 U/L (ref 0–55)
AST: 14 U/L (ref 5–34)
Alkaline Phosphatase: 88 U/L (ref 40–150)
BUN: 15 mg/dL (ref 7.0–26.0)
Calcium: 8.9 mg/dL (ref 8.4–10.4)
Chloride: 111 mEq/L — ABNORMAL HIGH (ref 98–107)
Creatinine: 1 mg/dL (ref 0.7–1.3)
Total Bilirubin: 0.34 mg/dL (ref 0.20–1.20)

## 2012-04-27 MED ORDER — OXYCODONE-ACETAMINOPHEN 5-325 MG PO TABS: 1.0000 | ORAL_TABLET | Freq: Three times a day (TID) | ORAL | Status: DC | PRN

## 2012-04-27 NOTE — Progress Notes (Signed)
Gave pt brief information on smoking cessation and resource for free patches

## 2012-04-27 NOTE — Progress Notes (Signed)
[name]    OFFICE PROGRESS NOTE   INTERVAL HISTORY:   He returns as scheduled. He complains of pain in the left knee. He reports Dr. Ninfa Linden plans to remove a "screw "from the left knee in approximately 2 months. He takes Percocet as needed for the pain. There is a limited range of motion at the left knee. No abdominal pain. He continues to have intermittent diarrhea. The diarrhea is controlled with Lomotil. He takes Gleevec most of the time, but skips the [place] several days per month.  Objective:  Vital signs in last 24 hours:  Blood pressure 160/82, pulse 77, temperature 98.3 F (36.8 C), temperature source Oral, resp. rate 20, height 5\' 9"  (1.753 m), weight 157 lb 6.4 oz (71.396 kg).    HEENT: No thrush or ulcers Lymphatics: No cervical, supraclavicular, or axillary nodes Resp: Decreased breath sounds with coarse and inspiratory rhonchi at the posterior base bilaterally, no respiratory distress Cardio: Regular rate and rhythm GI: Nontender, no hepatosplenomegaly Vascular: No left leg edema   Lab Results:  Lab Results  Component Value Date   WBC 5.2 04/27/2012   HGB 12.0* 04/27/2012   HCT 37.1* 04/27/2012   MCV 84.7 04/27/2012   PLT 131* 04/27/2012   ANC 3.2  BCR: ABL on 01/31/2012-12.4%  Medications: I have reviewed the patient's current medications.  Assessment/Plan: 1. Chronic myelogenous leukemia, diagnosed in January of 2009. He remains in hematologic remission. He is taking Gleevec at a dose of 200 mg daily. The peripheral blood PCR was markedly increased in May 2013, likely reflecting medical noncompliance. The peripheral blood PCR was significantly improved on 11/07/2011. The peripheral blood PCR was further improved on 01/31/2012. 2. 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 3. Status post C3-C4, C4-C5, anterior cervical diskectomy and fusion with allograft and plating 09/30/2010. 4. Status post a fall with a C3-C4 and C4-C5  traumatic cervical disk herniation with central spinal cord injury. 5. Depression, maintained on Lexapro. 6. Diarrhea, ? related to chronic pancreatitis versus Gleevec. He takes Lomotil as needed. Improved. 7. Indurated facial skin lesion 11/20/2007 with a history of MRSA skin infection of the submental area in May 2008. The induration resolved with doxycycline. 8. Left knee arthroscopy 05/25/2007. 9. Postoperative left knee effusion/pain, likely related to gout. 10. History of gout. He continues colchicine and allopurinol. 11. Chronic pancreatitis. 12. Status post right above-the-knee amputation. 13. MRSA infection of the submental area May 2008. 14. History of tobacco, alcohol, and cocaine use. 15. History of coronary artery disease. 16. "Shotty" lymphadenopathy of the neck, axilla, and left groin in 2009.  17. History of a microcytic anemia.  18. History of a right olecranon bursa lesion, ? gouty tophus. 19. 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 He previously took AndroGel. 20. History of anemia secondary to chronic disease and Gleevec therapy.  21. Anorexia-potentially related to [place]. He reports Medicaid would not pay for Megace. His weight is stable. 22. Chronic left knee and left foot pain. He takes Percocet as needed. We refilled the Percocet prescription today. He continues followup with Dr. Ninfa Linden  Disposition:  He is stable from a hematologic standpoint. He will continue Gleevec at the current dose. We will followup on the peripheral blood PCR from today. Mr. Rueff will return for a lab visit in 6 weeks. He is scheduled for a 3 month office visit. He was given reading materials on smoking cessation today. We refilled a prescription for Percocet.   [name], MD  04/27/2012  4:21  PM

## 2012-04-27 NOTE — Progress Notes (Signed)
Declines flu vaccine.

## 2012-04-30 ENCOUNTER — Telehealth: Payer: Self-pay | Admitting: Oncology

## 2012-04-30 NOTE — Telephone Encounter (Signed)
Talked to patient gave him appt for March 2014 lab and Md

## 2012-05-03 ENCOUNTER — Telehealth: Payer: Self-pay | Admitting: *Deleted

## 2012-05-03 NOTE — Telephone Encounter (Signed)
Made patient aware of Cmet and BCR results. Encouraged him to take his [place] on consistent basis.

## 2012-05-30 ENCOUNTER — Other Ambulatory Visit: Payer: Self-pay | Admitting: Oncology

## 2012-05-31 ENCOUNTER — Other Ambulatory Visit: Payer: Self-pay | Admitting: *Deleted

## 2012-05-31 NOTE — Telephone Encounter (Signed)
Dr. [name] is willing to refill if necessary, but prefers his PCP to follow this, Dr. [name]. Multiple attempts to call office without success. Will fax request for PCP to take over refill after this one is approved by Dr. [name] if he is still a current patient there.

## 2012-06-14 ENCOUNTER — Other Ambulatory Visit: Payer: Self-pay | Admitting: *Deleted

## 2012-06-14 DIAGNOSIS — C9210 Chronic myeloid leukemia, BCR/ABL-positive, not having achieved remission: Secondary | ICD-10-CM

## 2012-06-14 MED ORDER — IMATINIB MESYLATE 100 MG PO TABS: 200.0000 mg | ORAL_TABLET | Freq: Every day | ORAL | Status: DC

## 2012-06-25 ENCOUNTER — Other Ambulatory Visit: Payer: Self-pay | Admitting: Oncology

## 2012-06-25 DIAGNOSIS — C9210 Chronic myeloid leukemia, BCR/ABL-positive, not having achieved remission: Secondary | ICD-10-CM

## 2012-06-27 ENCOUNTER — Other Ambulatory Visit (HOSPITAL_COMMUNITY): Payer: Self-pay | Admitting: Orthopaedic Surgery

## 2012-06-28 ENCOUNTER — Encounter (HOSPITAL_COMMUNITY): Payer: Self-pay | Admitting: Respiratory Therapy

## 2012-07-02 ENCOUNTER — Encounter (HOSPITAL_COMMUNITY): Payer: Self-pay | Admitting: *Deleted

## 2012-07-02 MED ORDER — CEFAZOLIN SODIUM-DEXTROSE 2-3 GM-% IV SOLR
2.0000 g | INTRAVENOUS | Status: AC
  Administered 2012-07-03: 2 g via INTRAVENOUS
  Filled 2012-07-02: qty 50

## 2012-07-02 NOTE — Progress Notes (Signed)
07/02/12 1125  OBSTRUCTIVE SLEEP APNEA  Have you ever been diagnosed with sleep apnea through a sleep study? No  Do you snore loudly (loud enough to be heard through closed doors)?  0  Do you often feel tired, fatigued, or sleepy during the daytime? 1  Has anyone observed you stop breathing during your sleep? 0  Do you have, or are you being treated for high blood pressure? 1  BMI more than 35 kg/m2? 0  Age over 67 years old? 1  Neck circumference greater than 40 cm/18 inches? 0  Gender: 1  Obstructive Sleep Apnea Score 4  Score 4 or greater  Results sent to PCP

## 2012-07-03 ENCOUNTER — Ambulatory Visit (HOSPITAL_COMMUNITY): Payer: Medicare Other

## 2012-07-03 ENCOUNTER — Ambulatory Visit (HOSPITAL_COMMUNITY): Payer: Medicare Other | Admitting: Anesthesiology

## 2012-07-03 ENCOUNTER — Encounter (HOSPITAL_COMMUNITY): Payer: Self-pay | Admitting: *Deleted

## 2012-07-03 ENCOUNTER — Encounter (HOSPITAL_COMMUNITY)
Admission: RE | Disposition: A | Discharge status: Home / Self Care | Payer: Self-pay | Source: Ambulatory Visit | Attending: Orthopaedic Surgery

## 2012-07-03 ENCOUNTER — Encounter (HOSPITAL_COMMUNITY): Payer: Self-pay | Admitting: Anesthesiology

## 2012-07-03 ENCOUNTER — Ambulatory Visit (HOSPITAL_COMMUNITY)
Admission: RE | Admit: 2012-07-03 | Discharge: 2012-07-03 | Disposition: A | Discharge status: Home / Self Care | Payer: Medicare Other | Source: Ambulatory Visit | Attending: Orthopaedic Surgery | Admitting: Orthopaedic Surgery

## 2012-07-03 DIAGNOSIS — K219 Gastro-esophageal reflux disease without esophagitis: Secondary | ICD-10-CM | POA: Insufficient documentation

## 2012-07-03 DIAGNOSIS — M109 Gout, unspecified: Secondary | ICD-10-CM | POA: Insufficient documentation

## 2012-07-03 DIAGNOSIS — T8489XA Other specified complication of internal orthopedic prosthetic devices, implants and grafts, initial encounter: Secondary | ICD-10-CM | POA: Insufficient documentation

## 2012-07-03 DIAGNOSIS — Z7902 Long term (current) use of antithrombotics/antiplatelets: Secondary | ICD-10-CM | POA: Insufficient documentation

## 2012-07-03 DIAGNOSIS — K649 Unspecified hemorrhoids: Secondary | ICD-10-CM | POA: Insufficient documentation

## 2012-07-03 DIAGNOSIS — M79609 Pain in unspecified limb: Secondary | ICD-10-CM | POA: Insufficient documentation

## 2012-07-03 DIAGNOSIS — S78119A Complete traumatic amputation at level between unspecified hip and knee, initial encounter: Secondary | ICD-10-CM | POA: Insufficient documentation

## 2012-07-03 DIAGNOSIS — Z96659 Presence of unspecified artificial knee joint: Secondary | ICD-10-CM | POA: Insufficient documentation

## 2012-07-03 DIAGNOSIS — I10 Essential (primary) hypertension: Secondary | ICD-10-CM | POA: Insufficient documentation

## 2012-07-03 DIAGNOSIS — I252 Old myocardial infarction: Secondary | ICD-10-CM | POA: Insufficient documentation

## 2012-07-03 DIAGNOSIS — Z79899 Other long term (current) drug therapy: Secondary | ICD-10-CM | POA: Insufficient documentation

## 2012-07-03 DIAGNOSIS — Y831 Surgical operation with implant of artificial internal device as the cause of abnormal reaction of the patient, or of later complication, without mention of misadventure at the time of the procedure: Secondary | ICD-10-CM | POA: Insufficient documentation

## 2012-07-03 DIAGNOSIS — C9510 Chronic leukemia of unspecified cell type not having achieved remission: Secondary | ICD-10-CM | POA: Insufficient documentation

## 2012-07-03 DIAGNOSIS — M129 Arthropathy, unspecified: Secondary | ICD-10-CM | POA: Insufficient documentation

## 2012-07-03 HISTORY — PX: HARDWARE REMOVAL: SHX979

## 2012-07-03 HISTORY — DX: Acute myocardial infarction, unspecified: I21.9

## 2012-07-03 HISTORY — DX: Acute pancreatitis without necrosis or infection, unspecified: K85.90

## 2012-07-03 LAB — COMPREHENSIVE METABOLIC PANEL
ALT: 11 U/L (ref 0–53)
AST: 16 U/L (ref 0–37)
BUN: 10 mg/dL (ref 6–23)
CO2: 25 mEq/L (ref 19–32)
Chloride: 109 mEq/L (ref 96–112)
GFR calc Af Amer: >90 mL/min (ref >90)
Glucose, Bld: 104 mg/dL — ABNORMAL HIGH (ref 70–99)
Potassium: 3.3 mEq/L — ABNORMAL LOW (ref 3.5–5.1)

## 2012-07-03 LAB — CBC
HCT: 37.5 % — ABNORMAL LOW (ref 39.0–52.0)
Hemoglobin: 12.5 g/dL — ABNORMAL LOW (ref 13.0–17.0)
MCH: 27.4 pg (ref 26.0–34.0)
MCHC: 33.3 g/dL (ref 30.0–36.0)
RBC: 4.57 MIL/uL (ref 4.22–5.81)

## 2012-07-03 LAB — SURGICAL PCR SCREEN: MRSA, PCR: NEGATIVE

## 2012-07-03 SURGERY — REMOVAL, HARDWARE
Anesthesia: General | Site: Knee | Laterality: Left | Wound class: Clean

## 2012-07-03 MED ORDER — LIDOCAINE HCL 4 % MT SOLN
OROMUCOSAL | Status: DC | PRN
  Administered 2012-07-03: 4 mL via TOPICAL

## 2012-07-03 MED ORDER — HYDROMORPHONE HCL PF 1 MG/ML IJ SOLN
INTRAMUSCULAR | Status: AC
  Filled 2012-07-03: qty 1

## 2012-07-03 MED ORDER — HYDROMORPHONE HCL PF 1 MG/ML IJ SOLN
0.2500 mg | INTRAMUSCULAR | Status: DC | PRN
  Administered 2012-07-03: 0.5 mg via INTRAVENOUS

## 2012-07-03 MED ORDER — MUPIROCIN 2 % EX OINT
TOPICAL_OINTMENT | CUTANEOUS | Status: AC
  Administered 2012-07-03: 1 via NASAL
  Filled 2012-07-03: qty 22

## 2012-07-03 MED ORDER — ROCURONIUM BROMIDE 100 MG/10ML IV SOLN
INTRAVENOUS | Status: DC | PRN
  Administered 2012-07-03: 10 mg via INTRAVENOUS

## 2012-07-03 MED ORDER — PROPOFOL 10 MG/ML IV BOLUS
INTRAVENOUS | Status: DC | PRN
  Administered 2012-07-03: 200 mg via INTRAVENOUS

## 2012-07-03 MED ORDER — FENTANYL CITRATE 0.05 MG/ML IJ SOLN
INTRAMUSCULAR | Status: DC | PRN
  Administered 2012-07-03: 100 ug via INTRAVENOUS

## 2012-07-03 MED ORDER — BUPIVACAINE HCL 0.25 % IJ SOLN
INTRAMUSCULAR | Status: DC | PRN
  Administered 2012-07-03: 7 mL

## 2012-07-03 MED ORDER — MUPIROCIN 2 % EX OINT
TOPICAL_OINTMENT | Freq: Once | CUTANEOUS | Status: AC
  Administered 2012-07-03: 1 via NASAL

## 2012-07-03 MED ORDER — DEXTROSE 5 % IV SOLN
INTRAVENOUS | Status: DC | PRN
  Administered 2012-07-03: 16:00:00 via INTRAVENOUS

## 2012-07-03 MED ORDER — ARTIFICIAL TEARS OP OINT
TOPICAL_OINTMENT | OPHTHALMIC | Status: DC | PRN
  Administered 2012-07-03: 1 via OPHTHALMIC

## 2012-07-03 MED ORDER — LIDOCAINE HCL (CARDIAC) 20 MG/ML IV SOLN
INTRAVENOUS | Status: DC | PRN
  Administered 2012-07-03: 100 mg via INTRAVENOUS

## 2012-07-03 MED ORDER — SODIUM CHLORIDE 0.9 % IR SOLN
Status: DC | PRN
  Administered 2012-07-03: 1000 mL

## 2012-07-03 MED ORDER — BUPIVACAINE HCL (PF) 0.25 % IJ SOLN
INTRAMUSCULAR | Status: AC
  Filled 2012-07-03: qty 30

## 2012-07-03 MED ORDER — ONDANSETRON HCL 4 MG/2ML IJ SOLN: 4.0000 mg | Freq: Once | INTRAMUSCULAR | Status: DC | PRN

## 2012-07-03 MED ORDER — OXYCODONE-ACETAMINOPHEN 5-325 MG PO TABS: 1.0000 | ORAL_TABLET | ORAL | Status: DC | PRN

## 2012-07-03 MED ORDER — SUCCINYLCHOLINE CHLORIDE 20 MG/ML IJ SOLN
INTRAMUSCULAR | Status: DC | PRN
  Administered 2012-07-03: 60 mg via INTRAVENOUS

## 2012-07-03 MED ORDER — LACTATED RINGERS IV SOLN
INTRAVENOUS | Status: DC
  Administered 2012-07-03: 15:00:00 via INTRAVENOUS

## 2012-07-03 MED ORDER — ONDANSETRON HCL 4 MG/2ML IJ SOLN
INTRAMUSCULAR | Status: DC | PRN
  Administered 2012-07-03: 4 mg via INTRAVENOUS

## 2012-07-03 MED ORDER — LACTATED RINGERS IV SOLN
INTRAVENOUS | Status: DC | PRN
  Administered 2012-07-03 (×2): via INTRAVENOUS

## 2012-07-03 SURGICAL SUPPLY — 56 items
ADH SKN CLS APL DERMABOND .7 (GAUZE/BANDAGES/DRESSINGS) ×1
BANDAGE ELASTIC 4 VELCRO ST LF (GAUZE/BANDAGES/DRESSINGS) IMPLANT
BANDAGE ELASTIC 6 VELCRO ST LF (GAUZE/BANDAGES/DRESSINGS) ×1 IMPLANT
BANDAGE ESMARK 6X9 LF (GAUZE/BANDAGES/DRESSINGS) IMPLANT
BANDAGE GAUZE ELAST BULKY 4 IN (GAUZE/BANDAGES/DRESSINGS) ×1 IMPLANT
BNDG CMPR 9X6 STRL LF SNTH (GAUZE/BANDAGES/DRESSINGS)
BNDG COHESIVE 4X5 TAN STRL (GAUZE/BANDAGES/DRESSINGS) ×1 IMPLANT
BNDG ESMARK 6X9 LF (GAUZE/BANDAGES/DRESSINGS)
CLOTH BEACON ORANGE TIMEOUT ST (SAFETY) ×2 IMPLANT
COVER SURGICAL LIGHT HANDLE (MISCELLANEOUS) ×2 IMPLANT
CUFF TOURNIQUET SINGLE 34IN LL (TOURNIQUET CUFF) IMPLANT
CUFF TOURNIQUET SINGLE 44IN (TOURNIQUET CUFF) IMPLANT
DERMABOND ADVANCED (GAUZE/BANDAGES/DRESSINGS) ×1
DERMABOND ADVANCED .7 DNX12 (GAUZE/BANDAGES/DRESSINGS) IMPLANT
DRAPE C-ARM 42X72 X-RAY (DRAPES) IMPLANT
DRAPE EXTREMITY T 121X128X90 (DRAPE) ×1 IMPLANT
DRAPE INCISE IOBAN 66X45 STRL (DRAPES) ×1 IMPLANT
DRAPE ORTHO SPLIT 77X108 STRL (DRAPES) ×2
DRAPE PROXIMA HALF (DRAPES) ×1 IMPLANT
DRAPE SURG ORHT 6 SPLT 77X108 (DRAPES) IMPLANT
DRSG EMULSION OIL 3X3 NADH (GAUZE/BANDAGES/DRESSINGS) ×1 IMPLANT
DRSG MEPILEX BORDER 4X4 (GAUZE/BANDAGES/DRESSINGS) ×1 IMPLANT
DRSG PAD ABDOMINAL 8X10 ST (GAUZE/BANDAGES/DRESSINGS) ×1 IMPLANT
ELECT REM PT RETURN 9FT ADLT (ELECTROSURGICAL) ×2
ELECTRODE REM PT RTRN 9FT ADLT (ELECTROSURGICAL) ×1 IMPLANT
GLOVE BIO SURGEON STRL SZ7.5 (GLOVE) ×2 IMPLANT
GLOVE BIOGEL PI IND STRL 8 (GLOVE) ×1 IMPLANT
GLOVE BIOGEL PI INDICATOR 8 (GLOVE) ×3
GLOVE ORTHO TXT STRL SZ7.5 (GLOVE) ×2 IMPLANT
GLOVE SURG SS PI 8.0 STRL IVOR (GLOVE) ×2 IMPLANT
GOWN PREVENTION PLUS LG XLONG (DISPOSABLE) IMPLANT
GOWN PREVENTION PLUS XLARGE (GOWN DISPOSABLE) ×2 IMPLANT
GOWN PREVENTION PLUS XXLARGE (GOWN DISPOSABLE) ×1 IMPLANT
GOWN STRL NON-REIN LRG LVL3 (GOWN DISPOSABLE) ×3 IMPLANT
KIT BASIN OR (CUSTOM PROCEDURE TRAY) ×2 IMPLANT
KIT ROOM TURNOVER OR (KITS) ×2 IMPLANT
MANIFOLD NEPTUNE II (INSTRUMENTS) ×2 IMPLANT
NEEDLE 22X1 1/2 (OR ONLY) (NEEDLE) ×1 IMPLANT
NS IRRIG 1000ML POUR BTL (IV SOLUTION) ×2 IMPLANT
PACK GENERAL/GYN (CUSTOM PROCEDURE TRAY) ×2 IMPLANT
PAD ARMBOARD 7.5X6 YLW CONV (MISCELLANEOUS) ×4 IMPLANT
PAD CAST 4YDX4 CTTN HI CHSV (CAST SUPPLIES) ×1 IMPLANT
PADDING CAST COTTON 4X4 STRL (CAST SUPPLIES)
SPONGE GAUZE 4X4 12PLY (GAUZE/BANDAGES/DRESSINGS) ×1 IMPLANT
STAPLER VISISTAT 35W (STAPLE) ×2 IMPLANT
STOCKINETTE IMPERVIOUS 9X36 MD (GAUZE/BANDAGES/DRESSINGS) ×1 IMPLANT
SUT ETHILON 4 0 FS 1 (SUTURE) ×1 IMPLANT
SUT MNCRL AB 4-0 PS2 18 (SUTURE) ×1 IMPLANT
SUT VIC AB 0 CT1 27 (SUTURE)
SUT VIC AB 0 CT1 27XBRD ANBCTR (SUTURE) IMPLANT
SUT VIC AB 2-0 CT1 27 (SUTURE)
SUT VIC AB 2-0 CT1 TAPERPNT 27 (SUTURE) IMPLANT
SYR CONTROL 10ML LL (SYRINGE) ×1 IMPLANT
TOWEL OR 17X24 6PK STRL BLUE (TOWEL DISPOSABLE) ×2 IMPLANT
TOWEL OR 17X26 10 PK STRL BLUE (TOWEL DISPOSABLE) ×2 IMPLANT
WATER STERILE IRR 1000ML POUR (IV SOLUTION) ×1 IMPLANT

## 2012-07-03 NOTE — Anesthesia Preprocedure Evaluation (Addendum)
Anesthesia Evaluation  Patient identified by MRN, date of birth, ID band Patient awake    Reviewed: Allergy & Precautions, H&P , NPO status , Patient's Chart, lab work & pertinent test results, reviewed documented beta blocker date and time   Airway Mallampati: II TM Distance: >3 FB Neck ROM: full    Dental  (+) Teeth Intact and Dental Advisory Given   Pulmonary          Cardiovascular hypertension, Pt. on medications + Past MI  Coronary angioplasty approx 20 yrs ago   Neuro/Psych    GI/Hepatic GERD-  Medicated and Controlled,Pt states he has reflux due to pancreatitis   Endo/Other    Renal/GU      Musculoskeletal  (+) Arthritis -,   Abdominal   Peds  Hematology H/o leukemia   Anesthesia Other Findings   Reproductive/Obstetrics                         Anesthesia Physical Anesthesia Plan  ASA: III  Anesthesia Plan: General   Post-op Pain Management:    Induction: Intravenous  Airway Management Planned: LMA  Additional Equipment:   Intra-op Plan:   Post-operative Plan:   Informed Consent: I have reviewed the patients History and Physical, chart, labs and discussed the procedure including the risks, benefits and alternatives for the proposed anesthesia with the patient or authorized representative who has indicated his/her understanding and acceptance.     Plan Discussed with: CRNA and Surgeon  Anesthesia Plan Comments:         Anesthesia Quick Evaluation
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
## 2012-07-03 NOTE — Anesthesia Procedure Notes (Signed)
Procedure Name: Intubation Date/Time: 07/03/2012 4:10 PM Performed by: Terrill Mohr Pre-anesthesia Checklist: Patient identified, Emergency Drugs available, Suction available and Patient being monitored Patient Re-evaluated:Patient Re-evaluated prior to inductionOxygen Delivery Method: Circle system utilized Preoxygenation: Pre-oxygenation with 100% oxygen Intubation Type: IV induction Ventilation: Mask ventilation without difficulty Laryngoscope Size: Mac and 3 Grade View: Grade I Tube type: Oral Tube size: 7.5 mm Number of attempts: 1 Airway Equipment and Method: Stylet Placement Confirmation: ETT inserted through vocal cords under direct vision,  breath sounds checked- equal and bilateral and positive ETCO2 Secured at: 22 (cm at teeth) cm Tube secured with: Tape Dental Injury: Teeth and Oropharynx as per pre-operative assessment

## 2012-07-03 NOTE — Progress Notes (Signed)
Dr. Glennon Mac informed of BP 189/110. No new orders.

## 2012-07-03 NOTE — Preoperative (Signed)
Beta Blockers   Reason not to administer Beta Blockers:Not Applicable 

## 2012-07-03 NOTE — Brief Op Note (Signed)
07/03/2012  5:01 PM  PATIENT:  [name]  67 y.o. male  PRE-OPERATIVE DIAGNOSIS:  retained painful, prominent screw left knee  POST-OPERATIVE DIAGNOSIS:  retained painful, prominent screw left knee  PROCEDURE:  Procedure(s): Removal of screw left knee (Left)  SURGEON:  Surgeon(s) and Role:    * Mcarthur Rossetti, MD - Primary  PHYSICIAN ASSISTANT:   ASSISTANTS: none   ANESTHESIA:   local and general  EBL:  Total I/O In: 1050 [I.V.:1050] Out: 200 [Urine:200]  BLOOD ADMINISTERED:none  DRAINS: none   LOCAL MEDICATIONS USED:  MARCAINE     SPECIMEN:  No Specimen  DISPOSITION OF SPECIMEN:  N/A  COUNTS:  YES  TOURNIQUET:  * No tourniquets in log *  DICTATION: .Other Dictation: Dictation Number [id number]  PLAN OF CARE: Discharge to home after PACU  PATIENT DISPOSITION:  PACU - hemodynamically stable.   Delay start of Pharmacological VTE agent (>24hrs) due to surgical blood loss or risk of bleeding: not applicable

## 2012-07-03 NOTE — Transfer of Care (Signed)
Immediate Anesthesia Transfer of Care Note  Patient: [name]  Procedure(s) Performed: Procedure(s): Removal of screw left knee (Left)  Patient Location: PACU  Anesthesia Type:General  Level of Consciousness: awake, alert , oriented and patient cooperative  Airway & Oxygen Therapy: Patient Spontanous Breathing and Patient connected to nasal cannula oxygen  Post-op Assessment: Report given to PACU RN, Post -op Vital signs reviewed and stable and Patient moving all extremities X 4  Post vital signs: Reviewed and stable  Complications: No apparent anesthesia complications

## 2012-07-04 NOTE — Anesthesia Postprocedure Evaluation (Signed)
  Anesthesia Post-op Note  Patient: [name]  Procedure(s) Performed: Procedure(s): Removal of screw left knee (Left)  Patient Location: PACU  Anesthesia Type:General  Level of Consciousness: awake, oriented, sedated and patient cooperative  Airway and Oxygen Therapy: Patient Spontanous Breathing  Post-op Pain: mild  Post-op Assessment: Post-op Vital signs reviewed, Patient's Cardiovascular Status Stable, Respiratory Function Stable, Patent Airway, No signs of Nausea or vomiting and Pain level controlled  Post-op Vital Signs: stable  Complications: No apparent anesthesia complications

## 2012-07-04 NOTE — Op Note (Signed)
[name], [name] NO.:  [id number]  MEDICAL RECORD NO.:  [id number]  LOCATION:  [facility]                         FACILITY:  [facility]  PHYSICIAN:  Lind Guest. Ninfa Linden, M.D.DATE OF BIRTH:  [birth year]  DATE OF PROCEDURE:  07/03/2012 DATE OF DISCHARGE:  07/03/2012                              OPERATIVE REPORT   PREOPERATIVE DIAGNOSIS:  Painful prominent retained screw, left knee medial femoral condyle.  POSTOPERATIVE DIAGNOSIS:  Painful prominent retained screw, left knee medial femoral condyle.  PROCEDURE:  Removal of deep retained washer and screw, left knee medial femoral condyle.  SURGEON:  Lind Guest. Ninfa Linden, M.D.  ANESTHESIA: 1. General. 2. Local 0.25% plain Sensorcaine.  BLOOD LOSS:  Less than 50 mL.  COMPLICATIONS:  None.  INDICATIONS:  [name] is a 67 year old gentleman who a year ago underwent a left total knee revision arthroplasty.  He had a crack in his medial femoral condyle, which was repaired with a single partially threaded 3.5 screw with a washer.  He has since healed that fracture and the screw head has been painful and prominent.  He has developed a local bursitis and tendinitis around this.  I have injected it before to help to ease his symptoms, but he comes back with the prominence.  He is a very thin individual.  At this point, with a healed bone, we recommended hardware removal and he would does wish to proceed.  PROCEDURE DESCRIPTION:  After informed consent was obtained, appropriate left knee was marked.  He was  brought to the operating room and placed supine on the operative table.  General anesthesia was then obtained. His left knee was prepped and draped with DuraPrep and sterile drapes. Time-out was called to identify the correct patient, correct left knee. I was able to easily palpate the screw head and made a small incision directly over this.  I dissected down and removed soft tissue and bursal fluid from  around this.  I then removed the screw easily and the washer. I used a rongeur to smooth off the heterotopic bone around this area and then irrigated the tissue with normal saline solution.  I closed the deep tissue with 2-0 Vicryl followed by 2-0 Vicryl in the subcutaneous tissue, 4- 0 Monocryl subcuticular stitch and Dermabond was placed.  I infiltrated the incision with 0.25% plain Sensorcaine.  A well-padded sterile dressing was applied.  He was awakened, extubated, and taken to recovery room in stable condition.  All final counts were correct.  There were no complications noted.     Lind Guest. Ninfa Linden, M.D.     CYB/MEDQ  D:  07/03/2012  T:  07/04/2012  Job:  [id number]

## 2012-07-05 ENCOUNTER — Encounter (HOSPITAL_COMMUNITY): Payer: Self-pay | Admitting: Orthopaedic Surgery

## 2012-07-26 ENCOUNTER — Other Ambulatory Visit: Payer: Medicare Other | Admitting: Lab

## 2012-07-26 ENCOUNTER — Ambulatory Visit: Payer: Medicare Other | Admitting: Nurse Practitioner

## 2012-07-26 ENCOUNTER — Telehealth: Payer: Self-pay | Admitting: Oncology

## 2012-07-26 NOTE — Telephone Encounter (Signed)
Pt called and r/s appt for 3//20 to 4/1 , nurse notified

## 2012-08-07 ENCOUNTER — Other Ambulatory Visit (HOSPITAL_BASED_OUTPATIENT_CLINIC_OR_DEPARTMENT_OTHER): Payer: Medicare Other | Admitting: Lab

## 2012-08-07 ENCOUNTER — Telehealth: Payer: Self-pay | Admitting: Oncology

## 2012-08-07 ENCOUNTER — Ambulatory Visit (HOSPITAL_BASED_OUTPATIENT_CLINIC_OR_DEPARTMENT_OTHER): Payer: Medicare Other | Admitting: Nurse Practitioner

## 2012-08-07 VITALS — BP 148/90 | HR 91 | Temp 98.7°F | Resp 20 | Ht 69.0 in | Wt 154.3 lb

## 2012-08-07 DIAGNOSIS — R197 Diarrhea, unspecified: Secondary | ICD-10-CM

## 2012-08-07 DIAGNOSIS — C9211 Chronic myeloid leukemia, BCR/ABL-positive, in remission: Secondary | ICD-10-CM

## 2012-08-07 DIAGNOSIS — C9210 Chronic myeloid leukemia, BCR/ABL-positive, not having achieved remission: Secondary | ICD-10-CM

## 2012-08-07 DIAGNOSIS — G8929 Other chronic pain: Secondary | ICD-10-CM

## 2012-08-07 DIAGNOSIS — F329 Major depressive disorder, single episode, unspecified: Secondary | ICD-10-CM

## 2012-08-07 LAB — COMPREHENSIVE METABOLIC PANEL (CC13)
AST: 21 U/L (ref 5–34)
Albumin: 3.5 g/dL (ref 3.5–5.0)
BUN: 11.8 mg/dL (ref 7.0–26.0)
Calcium: 9.2 mg/dL (ref 8.4–10.4)
Chloride: 105 mEq/L (ref 98–107)
Glucose: 115 mg/dl — ABNORMAL HIGH (ref 70–99)
Potassium: 3.3 mEq/L — ABNORMAL LOW (ref 3.5–5.1)
Total Protein: 6.3 g/dL — ABNORMAL LOW (ref 6.4–8.3)

## 2012-08-07 LAB — CBC WITH DIFFERENTIAL/PLATELET
Basophils Absolute: 0.1 10*3/uL (ref 0.0–0.1)
Eosinophils Absolute: 0.4 10*3/uL (ref 0.0–0.5)
HGB: 11.5 g/dL — ABNORMAL LOW (ref 13.0–17.1)
MONO#: 0.5 10*3/uL (ref 0.1–0.9)
NEUT#: 3.1 10*3/uL (ref 1.5–6.5)
RDW: 15.2 % — ABNORMAL HIGH (ref 11.0–14.6)
WBC: 5.5 10*3/uL (ref 4.0–10.3)
lymph#: 1.5 10*3/uL (ref 0.9–3.3)

## 2012-08-07 NOTE — Progress Notes (Signed)
OFFICE PROGRESS NOTE  Interval history:  Mr. Osten returns as scheduled. He continues [place]. He estimates missing 3 or 4 doses per month. He continues to have intermittent diarrhea. The diarrhea is controlled with Lomotil. He did denies any skin rash. He has rare nausea.  He underwent surgery on 07/03/2012 with removal of a left knee screw.   Objective: Blood pressure 148/90, pulse 91, temperature 98.7 F (37.1 C), temperature source Oral, resp. rate 20, height 5\' 9"  (1.753 m), weight 154 lb 4.8 oz (69.99 kg).  Oropharynx is without thrush or ulceration. No palpable cervical, supraclavicular or axillary lymph nodes. Lungs are clear. Regular cardiac rhythm. Abdomen soft and nontender. No organomegaly. No left leg edema.  Lab Results: Lab Results  Component Value Date   WBC 5.5 08/07/2012   HGB 11.5* 08/07/2012   HCT 36.2* 08/07/2012   MCV 84.4 08/07/2012   PLT 163 08/07/2012    Chemistry:    Chemistry      Component Value Date/Time   NA 145 08/07/2012 1339   NA 144 07/03/2012 1330   K 3.3* 08/07/2012 1339   K 3.3* 07/03/2012 1330   CL 105 08/07/2012 1339   CL 109 07/03/2012 1330   CO2 26 08/07/2012 1339   CO2 25 07/03/2012 1330   BUN 11.8 08/07/2012 1339   BUN 10 07/03/2012 1330   CREATININE 1.2 08/07/2012 1339   CREATININE 0.82 07/03/2012 1330      Component Value Date/Time   CALCIUM 9.2 08/07/2012 1339   CALCIUM 9.2 07/03/2012 1330   ALKPHOS 82 08/07/2012 1339   ALKPHOS 89 07/03/2012 1330   AST 21 08/07/2012 1339   AST 16 07/03/2012 1330   ALT 21 08/07/2012 1339   ALT 11 07/03/2012 1330   BILITOT 0.34 08/07/2012 1339   BILITOT 0.4 07/03/2012 1330     BCR:ABL on 04/27/2012--- 29.17%.  Studies/Results: No results found.  Medications: I have reviewed the patient's current medications.  Assessment/Plan:  1. Chronic myelogenous leukemia, diagnosed in January of 2009. He remains in hematologic remission. He is taking Gleevec at a dose of 200 mg daily. The peripheral blood PCR was markedly increased  in May 2013, likely reflecting medical noncompliance. The peripheral blood PCR was significantly improved on 11/07/2011. The peripheral blood PCR was further improved on 01/31/2012. The peripheral blood PCR was increased on 04/27/2012. 2. 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 3. Status post C3-C4, C4-C5, anterior cervical diskectomy and fusion with allograft and plating 09/30/2010. 4. 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 5. Depression, maintained on Lexapro. 6. Diarrhea, ? related to chronic pancreatitis versus Gleevec. He takes Lomotil as needed. Improved. 7. Indurated facial skin lesion 11/20/2007 with a history of MRSA skin infection of the submental area in May 2008. The induration resolved with doxycycline. 8. Left knee arthroscopy 05/25/2007. 9. Postoperative left knee effusion/pain, likely related to gout. 10. History of gout. He continues colchicine and allopurinol. 11. Chronic pancreatitis. 12. Status post right above-the-knee amputation. 13. MRSA infection of the submental area May 2008. 14. History of tobacco, alcohol, and cocaine use. 15. History of coronary artery disease. 16. "Shotty" lymphadenopathy of the neck, axilla, and left groin in 2009.  17. History of a microcytic anemia.  18. History of a right olecranon bursa lesion, ? gouty tophus. 19. 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 He previously took AndroGel. 20. History of anemia secondary to chronic disease and Gleevec therapy.  21. Anorexia-potentially related to [place].  He reports Medicaid would not pay for Megace. His weight is stable. 22. Chronic left knee and left foot pain. He takes Percocet as needed. We refilled the Percocet prescription today. He continues followup with Dr. Ninfa Linden. 23. Status post removal of a left knee screw 07/03/2012.  Disposition-Mr. Helget continues [place]. We will followup on the BCR-ABL from today. He will  return for a lab visit in 6 weeks and a followup visit in 3 months. He will contact the office in the interim with any problems.  [name] ANP/GNP-BC

## 2012-08-14 ENCOUNTER — Telehealth: Payer: Self-pay | Admitting: *Deleted

## 2012-08-14 NOTE — Telephone Encounter (Signed)
Message copied by Tania Ade on Tue Aug 14, 2012  6:38 PM ------      Message from: [name] B      Created: Mon Aug 13, 2012  9:56 PM       pcr is stable, repeat bcr:abl next visit ------

## 2012-08-14 NOTE — Telephone Encounter (Signed)
Made patient aware results are stable. Follow up as scheduled.

## 2012-09-19 ENCOUNTER — Emergency Department (HOSPITAL_COMMUNITY)
Admission: EM | Admit: 2012-09-19 | Discharge: 2012-09-19 | Disposition: A | Discharge status: Home / Self Care | Payer: Medicare Other | Attending: Emergency Medicine | Admitting: Emergency Medicine

## 2012-09-19 ENCOUNTER — Encounter (HOSPITAL_COMMUNITY): Payer: Self-pay | Admitting: *Deleted

## 2012-09-19 DIAGNOSIS — Z856 Personal history of leukemia: Secondary | ICD-10-CM | POA: Insufficient documentation

## 2012-09-19 DIAGNOSIS — Z9861 Coronary angioplasty status: Secondary | ICD-10-CM | POA: Insufficient documentation

## 2012-09-19 DIAGNOSIS — I10 Essential (primary) hypertension: Secondary | ICD-10-CM | POA: Insufficient documentation

## 2012-09-19 DIAGNOSIS — Z79899 Other long term (current) drug therapy: Secondary | ICD-10-CM | POA: Insufficient documentation

## 2012-09-19 DIAGNOSIS — Z96659 Presence of unspecified artificial knee joint: Secondary | ICD-10-CM | POA: Insufficient documentation

## 2012-09-19 DIAGNOSIS — R6883 Chills (without fever): Secondary | ICD-10-CM | POA: Insufficient documentation

## 2012-09-19 DIAGNOSIS — M129 Arthropathy, unspecified: Secondary | ICD-10-CM | POA: Insufficient documentation

## 2012-09-19 DIAGNOSIS — R1110 Vomiting, unspecified: Secondary | ICD-10-CM | POA: Insufficient documentation

## 2012-09-19 DIAGNOSIS — R197 Diarrhea, unspecified: Secondary | ICD-10-CM | POA: Insufficient documentation

## 2012-09-19 DIAGNOSIS — I252 Old myocardial infarction: Secondary | ICD-10-CM | POA: Insufficient documentation

## 2012-09-19 DIAGNOSIS — Z7902 Long term (current) use of antithrombotics/antiplatelets: Secondary | ICD-10-CM | POA: Insufficient documentation

## 2012-09-19 DIAGNOSIS — Z8719 Personal history of other diseases of the digestive system: Secondary | ICD-10-CM | POA: Insufficient documentation

## 2012-09-19 DIAGNOSIS — Z8679 Personal history of other diseases of the circulatory system: Secondary | ICD-10-CM | POA: Insufficient documentation

## 2012-09-19 DIAGNOSIS — R509 Fever, unspecified: Secondary | ICD-10-CM | POA: Insufficient documentation

## 2012-09-19 DIAGNOSIS — M109 Gout, unspecified: Secondary | ICD-10-CM | POA: Insufficient documentation

## 2012-09-19 DIAGNOSIS — E876 Hypokalemia: Secondary | ICD-10-CM

## 2012-09-19 DIAGNOSIS — Z87828 Personal history of other (healed) physical injury and trauma: Secondary | ICD-10-CM | POA: Insufficient documentation

## 2012-09-19 DIAGNOSIS — Z9889 Other specified postprocedural states: Secondary | ICD-10-CM | POA: Insufficient documentation

## 2012-09-19 DIAGNOSIS — Z87891 Personal history of nicotine dependence: Secondary | ICD-10-CM | POA: Insufficient documentation

## 2012-09-19 LAB — CBC WITH DIFFERENTIAL/PLATELET
Basophils Relative: 0 % (ref 0–1)
Hemoglobin: 12.9 g/dL — ABNORMAL LOW (ref 13.0–17.0)
MCHC: 33.6 g/dL (ref 30.0–36.0)
Monocytes Relative: 7 % (ref 3–12)
Neutro Abs: 4.3 10*3/uL (ref 1.7–7.7)
Neutrophils Relative %: 71 % (ref 43–77)
Platelets: 174 10*3/uL (ref 150–400)
RBC: 4.74 MIL/uL (ref 4.22–5.81)

## 2012-09-19 LAB — COMPREHENSIVE METABOLIC PANEL
CO2: 30 mEq/L (ref 19–32)
Calcium: 9.4 mg/dL (ref 8.4–10.5)
Chloride: 100 mEq/L (ref 96–112)
Creatinine, Ser: 1.25 mg/dL (ref 0.50–1.35)
GFR calc Af Amer: 67 mL/min — ABNORMAL LOW (ref >90)
GFR calc non Af Amer: 58 mL/min — ABNORMAL LOW (ref >90)
Glucose, Bld: 122 mg/dL — ABNORMAL HIGH (ref 70–99)
Potassium: 3 mEq/L — ABNORMAL LOW (ref 3.5–5.1)
Sodium: 142 mEq/L (ref 135–145)

## 2012-09-19 LAB — URINALYSIS, ROUTINE W REFLEX MICROSCOPIC
Leukocytes, UA: NEGATIVE
Nitrite: NEGATIVE
Protein, ur: 100 mg/dL — AB
Urobilinogen, UA: 1 mg/dL (ref 0.0–1.0)

## 2012-09-19 LAB — URINE MICROSCOPIC-ADD ON

## 2012-09-19 MED ORDER — SODIUM CHLORIDE 0.9 % IV BOLUS (SEPSIS)
1000.0000 mL | Freq: Once | INTRAVENOUS | Status: AC
  Administered 2012-09-19: 1000 mL via INTRAVENOUS

## 2012-09-19 MED ORDER — ONDANSETRON HCL 4 MG/2ML IJ SOLN: 4.0000 mg | Freq: Once | INTRAMUSCULAR | Status: DC

## 2012-09-19 MED ORDER — POTASSIUM CHLORIDE CRYS ER 20 MEQ PO TBCR
40.0000 meq | EXTENDED_RELEASE_TABLET | Freq: Once | ORAL | Status: AC
  Administered 2012-09-19: 40 meq via ORAL
  Filled 2012-09-19: qty 2

## 2012-09-19 MED ORDER — ONDANSETRON HCL 4 MG PO TABS: ORAL_TABLET | ORAL | Status: DC

## 2012-09-19 MED ORDER — NALOXONE HCL 0.4 MG/ML IJ SOLN
INTRAMUSCULAR | Status: AC
  Filled 2012-09-19: qty 1

## 2012-09-19 MED ORDER — POTASSIUM CHLORIDE 10 MEQ/100ML IV SOLN
10.0000 meq | Freq: Once | INTRAVENOUS | Status: AC
  Administered 2012-09-19: 10 meq via INTRAVENOUS
  Filled 2012-09-19: qty 100

## 2012-09-19 NOTE — ED Notes (Signed)
Patient given a meal bag and drink.

## 2012-09-19 NOTE — ED Notes (Signed)
Patient states he is no longer nauseated and wants to wait to take the Zofran.

## 2012-09-19 NOTE — ED Notes (Signed)
Pt arrived by [facility] for n/v x 2 days and fever/chills. No acute distress noted at triage, iv started pta

## 2012-09-19 NOTE — ED Provider Notes (Signed)
Medical screening examination/treatment/procedure(s) were conducted as a shared visit with non-physician practitioner(s) and myself.  I personally evaluated the patient during the encounter  [name] is a 67 y.o. male hx of CML on Glevec here with ab pain, vomiting, diarrhea since yesterday. Patient non toxic appearing. Vitals stable. MM slightly dry. Abdomen soft nontender and he has no abdominal surgeries in the past. Labs at baseline. Hydrated in the ED. Likely gastro. Stable for d/c home with prn zofran.    Wandra Arthurs, MD 09/19/12 [phone]
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
## 2012-09-21 ENCOUNTER — Other Ambulatory Visit: Payer: Self-pay | Admitting: *Deleted

## 2012-09-21 DIAGNOSIS — C9210 Chronic myeloid leukemia, BCR/ABL-positive, not having achieved remission: Secondary | ICD-10-CM

## 2012-09-21 MED ORDER — IMATINIB MESYLATE 100 MG PO TABS: 200.0000 mg | ORAL_TABLET | Freq: Every day | ORAL | Status: DC

## 2012-11-12 ENCOUNTER — Other Ambulatory Visit (HOSPITAL_BASED_OUTPATIENT_CLINIC_OR_DEPARTMENT_OTHER): Payer: Medicare Other | Admitting: Lab

## 2012-11-12 ENCOUNTER — Encounter: Payer: Medicare Other | Admitting: Nurse Practitioner

## 2012-11-12 ENCOUNTER — Ambulatory Visit (HOSPITAL_BASED_OUTPATIENT_CLINIC_OR_DEPARTMENT_OTHER): Payer: Medicare Other | Admitting: Oncology

## 2012-11-12 VITALS — BP 181/96 | HR 62 | Temp 97.9°F | Resp 20 | Ht 69.0 in | Wt 156.2 lb

## 2012-11-12 DIAGNOSIS — C9210 Chronic myeloid leukemia, BCR/ABL-positive, not having achieved remission: Secondary | ICD-10-CM

## 2012-11-12 LAB — COMPREHENSIVE METABOLIC PANEL (CC13)
AST: 15 U/L (ref 5–34)
Albumin: 3.7 g/dL (ref 3.5–5.0)
Alkaline Phosphatase: 70 U/L (ref 40–150)
BUN: 11.2 mg/dL (ref 7.0–26.0)
Creatinine: 1.2 mg/dL (ref 0.7–1.3)
Potassium: 3.4 mEq/L — ABNORMAL LOW (ref 3.5–5.1)
Total Bilirubin: 0.32 mg/dL (ref 0.20–1.20)

## 2012-11-12 LAB — CBC WITH DIFFERENTIAL/PLATELET
Basophils Absolute: 0 10*3/uL (ref 0.0–0.1)
EOS%: 8.3 % — ABNORMAL HIGH (ref 0.0–7.0)
HGB: 12.2 g/dL — ABNORMAL LOW (ref 13.0–17.1)
MCH: 27 pg — ABNORMAL LOW (ref 27.2–33.4)
MCV: 83.3 fL (ref 79.3–98.0)
MONO%: 8.3 % (ref 0.0–14.0)
RDW: 14.8 % — ABNORMAL HIGH (ref 11.0–14.6)

## 2012-11-12 MED ORDER — OXYCODONE-ACETAMINOPHEN 5-325 MG PO TABS: 1.0000 | ORAL_TABLET | Freq: Four times a day (QID) | ORAL | Status: DC | PRN

## 2012-11-12 NOTE — Progress Notes (Signed)
See dictated note 11/12/2012.

## 2012-11-12 NOTE — Progress Notes (Signed)
OFFICE PROGRESS NOTE  Interval history:  [name] returns as scheduled. He continues [place]. He has missed a few doses. Overall he has been taking it regularly. He continues to have intermittent loose stools. He takes Lomotil as needed. He reports being seen in the emergency department about 1 month ago for "dehydration". He continues to have pain at the left knee. He requested we refill his pain medication.  Objective: Blood pressure 181/96, pulse 62, temperature 97.9 F (36.6 C), temperature source Oral, resp. rate 20, height 5\' 9"  (1.753 m), weight 156 lb 3.2 oz (70.852 kg).  No thrush. No palpable cervical or supraclavicular lymph nodes. Lungs are clear. Regular cardiac rhythm. Abdomen is soft and nontender. No organomegaly. No left leg edema.  Lab Results: Lab Results  Component Value Date   WBC 4.0 11/12/2012   HGB 12.2* 11/12/2012   HCT 37.5* 11/12/2012   MCV 83.3 11/12/2012   PLT 173 11/12/2012    Chemistry:    Chemistry      Component Value Date/Time   NA 145 11/12/2012 1432   NA 142 09/19/2012 0937   K 3.4* 11/12/2012 1432   K 3.0* 09/19/2012 0937   CL 100 09/19/2012 0937   CL 105 08/07/2012 1339   CO2 30* 11/12/2012 1432   CO2 30 09/19/2012 0937   BUN 11.2 11/12/2012 1432   BUN 17 09/19/2012 0937   CREATININE 1.2 11/12/2012 1432   CREATININE 1.25 09/19/2012 0937      Component Value Date/Time   CALCIUM 9.9 11/12/2012 1432   CALCIUM 9.4 09/19/2012 0937   ALKPHOS 70 11/12/2012 1432   ALKPHOS 80 09/19/2012 0937   AST 15 11/12/2012 1432   AST 18 09/19/2012 0937   ALT 11 11/12/2012 1432   ALT 13 09/19/2012 0937   BILITOT 0.32 11/12/2012 1432   BILITOT 0.6 09/19/2012 0937     08/07/2012 BCR-ABL 36%  Studies/Results: No results found.  Medications: I have reviewed the patient's current medications.  Assessment/Plan:  1. Chronic myelogenous leukemia, diagnosed in January of 2009. He remains in hematologic remission. He is taking Gleevec at a dose of 200 mg daily. The peripheral blood PCR was  markedly increased in May 2013, likely reflecting medical noncompliance. The peripheral blood PCR was significantly improved on 11/07/2011. The peripheral blood PCR was further improved on 01/31/2012. The peripheral blood PCR was increased on 04/27/2012; stable on 08/07/2012. 2. 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 3. Status post C3-C4, C4-C5, anterior cervical diskectomy and fusion with allograft and plating 09/30/2010. 4. 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 5. Depression, maintained on Lexapro. 6. Diarrhea, ? related to chronic pancreatitis versus Gleevec. He takes Lomotil as needed.  7. Indurated facial skin lesion 11/20/2007 with a history of MRSA skin infection of the submental area in May 2008. The induration resolved with doxycycline. 8. Left knee arthroscopy 05/25/2007. 9. Postoperative left knee effusion/pain, likely related to gout. 10. History of gout. He continues colchicine and allopurinol. 11. Chronic pancreatitis. 12. Status post right above-the-knee amputation. 13. MRSA infection of the submental area May 2008. 14. History of tobacco, alcohol, and cocaine use. 15. History of coronary artery disease. 16. "Shotty" lymphadenopathy of the neck, axilla, and left groin in 2009.  17. History of a microcytic anemia.  18. History of a right olecranon bursa lesion, ? gouty tophus. 19. 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 He previously took AndroGel. 20. History of anemia secondary to chronic disease and  [place] therapy.  21. Anorexia-potentially related to [place]. He reports Medicaid would not pay for Megace. His weight is stable. 22. Chronic left knee and left foot pain. He takes Percocet as needed. We refilled the Percocet prescription today.  23. Status post removal of a left knee screw 07/03/2012.  Disposition-[name] appears stable. He continues [place]. We will followup on the BCR/ABL result from  today. He will return for a followup visit in 3 months. He will contact the office in the interim with any problems.  Plan reviewed with Dr. [name].  [name] ANP/GNP-BC

## 2012-11-13 ENCOUNTER — Telehealth: Payer: Self-pay | Admitting: *Deleted

## 2012-11-13 NOTE — Telephone Encounter (Signed)
Requesting documentation from office that he was here on 11/12/12 from 2:30 to 4:30 pm. Would like to pick this up today. Are able to confirm this for him and write this on prescription for him to pick up.

## 2012-11-14 ENCOUNTER — Telehealth: Payer: Self-pay | Admitting: Oncology

## 2012-11-14 NOTE — Telephone Encounter (Signed)
, °

## 2012-11-21 ENCOUNTER — Telehealth: Payer: Self-pay | Admitting: *Deleted

## 2012-11-21 NOTE — Telephone Encounter (Signed)
Message copied by Brien Few on Wed Nov 21, 2012  1:32 PM ------      Message from: [name] B      Created: Mon Nov 19, 2012  8:51 PM       Please call patient, pcr is stable, cont. Gleevec, be sure he is taking it daily.  We will need to change therapy if the PCR does not improve. ------

## 2012-11-21 NOTE — Telephone Encounter (Signed)
Called pt, he reports he takes his [place] almost everyday. Sometimes he forgets. Reinforced teaching re: medication compliance. Pt voiced understanding. Stated he will change his routine and put his medicine where he'd be reminded to take it.

## 2013-01-02 ENCOUNTER — Other Ambulatory Visit: Payer: Self-pay | Admitting: *Deleted

## 2013-01-02 DIAGNOSIS — C9210 Chronic myeloid leukemia, BCR/ABL-positive, not having achieved remission: Secondary | ICD-10-CM

## 2013-01-02 MED ORDER — IMATINIB MESYLATE 100 MG PO TABS: 200.0000 mg | ORAL_TABLET | Freq: Every day | ORAL | Status: DC

## 2013-01-09 ENCOUNTER — Other Ambulatory Visit: Payer: Self-pay | Admitting: *Deleted

## 2013-01-09 MED ORDER — IMATINIB MESYLATE 400 MG PO TABS: ORAL_TABLET | ORAL | Status: DC

## 2013-01-09 NOTE — Telephone Encounter (Signed)
Change in formulary for cost savings from [facility] 100mg , 2 tablets daily to [place] 400mg  tabs, 1/2 tab daily. Discussed with patient and gave verbal instructions about cutting pills. He will notify us if there is a problem.

## 2013-02-12 ENCOUNTER — Other Ambulatory Visit (HOSPITAL_BASED_OUTPATIENT_CLINIC_OR_DEPARTMENT_OTHER): Payer: Medicare Other | Admitting: Lab

## 2013-02-12 ENCOUNTER — Ambulatory Visit (HOSPITAL_BASED_OUTPATIENT_CLINIC_OR_DEPARTMENT_OTHER): Payer: Medicare Other | Admitting: Oncology

## 2013-02-12 VITALS — BP 163/93 | HR 81 | Temp 98.0°F | Resp 18 | Ht 69.0 in | Wt 159.8 lb

## 2013-02-12 DIAGNOSIS — R21 Rash and other nonspecific skin eruption: Secondary | ICD-10-CM

## 2013-02-12 DIAGNOSIS — C9211 Chronic myeloid leukemia, BCR/ABL-positive, in remission: Secondary | ICD-10-CM

## 2013-02-12 DIAGNOSIS — C9210 Chronic myeloid leukemia, BCR/ABL-positive, not having achieved remission: Secondary | ICD-10-CM

## 2013-02-12 DIAGNOSIS — F329 Major depressive disorder, single episode, unspecified: Secondary | ICD-10-CM

## 2013-02-12 LAB — COMPREHENSIVE METABOLIC PANEL (CC13)
AST: 15 U/L (ref 5–34)
Alkaline Phosphatase: 77 U/L (ref 40–150)
BUN: 14.8 mg/dL (ref 7.0–26.0)
Creatinine: 1.1 mg/dL (ref 0.7–1.3)
Glucose: 146 mg/dl — ABNORMAL HIGH (ref 70–140)

## 2013-02-12 LAB — CBC WITH DIFFERENTIAL/PLATELET
Basophils Absolute: 0 10*3/uL (ref 0.0–0.1)
EOS%: 6.7 % (ref 0.0–7.0)
HCT: 39.3 % (ref 38.4–49.9)
HGB: 12.8 g/dL — ABNORMAL LOW (ref 13.0–17.1)
LYMPH%: 25.6 % (ref 14.0–49.0)
MCH: 26.5 pg — ABNORMAL LOW (ref 27.2–33.4)
MCV: 81.4 fL (ref 79.3–98.0)
MONO%: 7.6 % (ref 0.0–14.0)
NEUT%: 59.3 % (ref 39.0–75.0)
Platelets: 249 10*3/uL (ref 140–400)

## 2013-02-12 MED ORDER — HYDROCODONE-ACETAMINOPHEN 5-325 MG PO TABS: 1.0000 | ORAL_TABLET | Freq: Two times a day (BID) | ORAL | Status: DC | PRN

## 2013-02-12 NOTE — Progress Notes (Signed)
[facility]    OFFICE PROGRESS NOTE   INTERVAL HISTORY:   He returns for scheduled followup of CML. He reports taking Gleevec approximately 26 days out of the month. He is not taking potassium consistently. No diarrhea at present. He reports recent "gout "pain at both wrists. He continues to have pain at the left knee and foot. Mr. Brinkerhoff had the "flu" last week. He reports a fever, cough, sore throat, and myalgias. He took over-the-counter medications for relief of symptoms. The symptoms have improved.  Objective:  Vital signs in last 24 hours:  Blood pressure 163/93, pulse 81, temperature 98 F (36.7 C), temperature source Oral, resp. rate 18, height 5\' 9"  (1.753 m), weight 159 lb 12.8 oz (72.485 kg).    HEENT: Mild whitecoat over the tongue, no buccal thrush, no ulcer Lymphatics: No cervical, supraclavicular, axillary, or inguinal nodes Resp: Decreased breath sounds at the right base, no respiratory distress Cardio: Regular rate and rhythm GI: No hepatosplenomegaly, nontender Vascular: No left leg edema  Skin: 1 cm slightly raised erythematous lesions in a vertical linear distribution at the left back , no vesicles   Portacath/PICC-without erythema  Lab Results:  Lab Results  Component Value Date   WBC 5.4 02/12/2013   HGB 12.8* 02/12/2013   HCT 39.3 02/12/2013   MCV 81.4 02/12/2013   PLT 249 02/12/2013   ANC 3.2 Potassium 3.1, creatinine 1.1 Peripheral blood BCR/ABL on 11/12/2012-37.52%  Medications: I have reviewed the patient's current medications.  Assessment/Plan: 1. Chronic myelogenous leukemia, diagnosed in January of 2009. He remains in hematologic remission. He is taking Gleevec at a dose of 200 mg daily. The peripheral blood PCR was markedly increased in May 2013, likely reflecting medical noncompliance. The peripheral blood PCR was significantly improved on 11/07/2011. The peripheral blood PCR was further improved on 01/31/2012. The peripheral  blood PCR was increased on 04/27/2012; stable on 11/12/2012 2. 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 3. Status post C3-C4, C4-C5, anterior cervical diskectomy and fusion with allograft and plating 09/30/2010. 4. 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 5. Depression, maintained on Lexapro. 6. Diarrhea, ? related to chronic pancreatitis versus Gleevec. He takes Lomotil as needed.  7. Indurated facial skin lesion 11/20/2007 with a history of MRSA skin infection of the submental area in May 2008. The induration resolved with doxycycline. 8. Left knee arthroscopy 05/25/2007. 9. Postoperative left knee effusion/pain, likely related to gout. 10. History of gout. He continues colchicine and allopurinol. 11. Chronic pancreatitis. 12. Status post right above-the-knee amputation. 13. MRSA infection of the submental area May 2008. 14. History of tobacco, alcohol, and cocaine use. 15. History of coronary artery disease. 16. "Shotty" lymphadenopathy of the neck, axilla, and left groin in 2009.  17. History of a microcytic anemia.  18. History of a right olecranon bursa lesion, ? gouty tophus. 19. 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 He previously took AndroGel. 20. History of anemia secondary to chronic disease and Gleevec therapy.  21. Anorexia-potentially related to [place]. He reports Medicaid would not pay for Megace. He has gained weight 22. Chronic left knee and left foot pain. He takes hydrocodone as needed 23. Status post removal of a left knee screw 07/03/2012. 24. Rash-? Insect bites  Disposition:  Mr. Muckleroy is stable from hematologic standpoint. We will followup on the peripheral blood PCR from today and decide on switching to a different tyrosine kinase inhibitor. He reports being compliant  with the [place] at present. Mr. Weigman will return for an office and lab visit in approximately 2-1/2 months.  He will take  potassium twice daily as prescribed.  I recommended he see Dr. Jeanie Cooks for an influenza vaccine.   [name], MD  02/12/2013  4:21 PM

## 2013-02-14 ENCOUNTER — Telehealth: Payer: Self-pay | Admitting: Oncology

## 2013-02-14 NOTE — Telephone Encounter (Signed)
s.w. pt and advised on 12.16.14 appt....pt ok adn aware

## 2013-04-23 ENCOUNTER — Other Ambulatory Visit: Payer: Medicare Other

## 2013-04-23 ENCOUNTER — Ambulatory Visit: Payer: Medicare Other | Admitting: Nurse Practitioner

## 2013-04-25 ENCOUNTER — Telehealth: Payer: Self-pay | Admitting: Oncology

## 2013-04-25 NOTE — Telephone Encounter (Signed)
Pt called r/s lab and ML to 12/22 per pt rqst

## 2013-04-29 ENCOUNTER — Ambulatory Visit (HOSPITAL_BASED_OUTPATIENT_CLINIC_OR_DEPARTMENT_OTHER): Payer: Medicare Other | Admitting: Nurse Practitioner

## 2013-04-29 ENCOUNTER — Other Ambulatory Visit (HOSPITAL_BASED_OUTPATIENT_CLINIC_OR_DEPARTMENT_OTHER): Payer: Medicare Other

## 2013-04-29 ENCOUNTER — Telehealth: Payer: Self-pay | Admitting: Oncology

## 2013-04-29 VITALS — BP 136/85 | HR 97 | Temp 97.2°F | Resp 20 | Ht 69.0 in | Wt 159.4 lb

## 2013-04-29 DIAGNOSIS — R197 Diarrhea, unspecified: Secondary | ICD-10-CM

## 2013-04-29 DIAGNOSIS — C9210 Chronic myeloid leukemia, BCR/ABL-positive, not having achieved remission: Secondary | ICD-10-CM

## 2013-04-29 LAB — CBC WITH DIFFERENTIAL/PLATELET
Basophils Absolute: 0 10*3/uL (ref 0.0–0.1)
Eosinophils Absolute: 0.3 10*3/uL (ref 0.0–0.5)
HGB: 14 g/dL (ref 13.0–17.1)
LYMPH%: 25.5 % (ref 14.0–49.0)
MCV: 81.4 fL (ref 79.3–98.0)
MONO#: 0.4 10*3/uL (ref 0.1–0.9)
MONO%: 8.2 % (ref 0.0–14.0)
NEUT#: 3.2 10*3/uL (ref 1.5–6.5)
Platelets: 225 10*3/uL (ref 140–400)
RDW: 15.5 % — ABNORMAL HIGH (ref 11.0–14.6)
WBC: 5.4 10*3/uL (ref 4.0–10.3)

## 2013-04-29 LAB — COMPREHENSIVE METABOLIC PANEL (CC13)
Albumin: 3.8 g/dL (ref 3.5–5.0)
Alkaline Phosphatase: 76 U/L (ref 40–150)
Anion Gap: 13 mEq/L — ABNORMAL HIGH (ref 3–11)
BUN: 12 mg/dL (ref 7.0–26.0)
CO2: 25 mEq/L (ref 22–29)
Calcium: 9.4 mg/dL (ref 8.4–10.4)
Glucose: 124 mg/dl (ref 70–140)
Potassium: 3.2 mEq/L — ABNORMAL LOW (ref 3.5–5.1)
Sodium: 140 mEq/L (ref 136–145)
Total Protein: 6.9 g/dL (ref 6.4–8.3)

## 2013-04-29 NOTE — Telephone Encounter (Signed)
S/w the pt and he is aware of his march 2015 appts.

## 2013-04-29 NOTE — Progress Notes (Signed)
OFFICE PROGRESS NOTE  Interval history:  [name] returns for followup of CML. He reports taking Gleevec consistently. He is not taking potassium consistently. Diarrhea is controlled with Lomotil. He has intermittent nausea/vomiting. No skin rash. He denies leg swelling. He notes periodic shortness of breath and an occasional cough. No fever. He continues to have pain at the left knee.  He wonders if he should undergo HIV testing related to risk factors "many years ago".   Objective: Blood pressure 136/85, pulse 97, temperature 97.2 F (36.2 C), temperature source Oral, resp. rate 20, height 5\' 9"  (1.753 m), weight 159 lb 6.4 oz (72.303 kg).  No thrush or ulceration. No palpable cervical, supraclavicular or axillary lymph nodes. Lungs are clear. Regular cardiac rhythm. Abdomen soft and nontender. No organomegaly. No left leg edema. No skin rash.  Lab Results: Lab Results  Component Value Date   WBC 5.4 04/29/2013   HGB 14.0 04/29/2013   HCT 42.4 04/29/2013   MCV 81.4 04/29/2013   PLT 225 04/29/2013    Chemistry:    Chemistry      Component Value Date/Time   NA 140 04/29/2013 1104   NA 142 09/19/2012 0937   K 3.2* 04/29/2013 1104   K 3.0* 09/19/2012 0937   CL 100 09/19/2012 0937   CL 105 08/07/2012 1339   CO2 25 04/29/2013 1104   CO2 30 09/19/2012 0937   BUN 12.0 04/29/2013 1104   BUN 17 09/19/2012 0937   CREATININE 1.3 04/29/2013 1104   CREATININE 1.25 09/19/2012 0937      Component Value Date/Time   CALCIUM 9.4 04/29/2013 1104   CALCIUM 9.4 09/19/2012 0937   ALKPHOS 76 04/29/2013 1104   ALKPHOS 80 09/19/2012 0937   AST 19 04/29/2013 1104   AST 18 09/19/2012 0937   ALT 15 04/29/2013 1104   ALT 13 09/19/2012 0937   BILITOT 0.41 04/29/2013 1104   BILITOT 0.6 09/19/2012 0937     Peripheral blood BCR/ABL on 02/12/2013 5.33%  Studies/Results: No results found.  Medications: I have reviewed the patient's current medications.  Assessment/Plan:  1. Chronic myelogenous  leukemia, diagnosed in January of 2009. He remains in hematologic remission. He is taking Gleevec at a dose of 200 mg daily. The peripheral blood PCR was markedly increased in May 2013, likely reflecting medical noncompliance. The peripheral blood PCR was significantly improved on 11/07/2011. The peripheral blood PCR was further improved on 01/31/2012. The peripheral blood PCR was increased on 04/27/2012; stable on 11/12/2012, improved on 02/12/2013. 2. 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 3. Status post C3-C4, C4-C5, anterior cervical diskectomy and fusion with allograft and plating 09/30/2010. 4. 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 5. Depression, maintained on Lexapro. 6. Diarrhea, ? related to chronic pancreatitis versus Gleevec. He takes Lomotil as needed.  7. Indurated facial skin lesion 11/20/2007 with a history of MRSA skin infection of the submental area in May 2008. The induration resolved with doxycycline. 8. Left knee arthroscopy 05/25/2007. 9. Postoperative left knee effusion/pain, likely related to gout. 10. History of gout. He continues colchicine and allopurinol. 11. Chronic pancreatitis. 12. Status post right above-the-knee amputation. 13. MRSA infection of the submental area May 2008. 14. History of tobacco, alcohol, and cocaine use. 15. History of coronary artery disease. 16. "Shotty" lymphadenopathy of the neck, axilla, and left groin in 2009.  17. History of a microcytic anemia.  18. History of a right olecranon bursa lesion, ?  gouty tophus. 19. 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 He previously took AndroGel. 20. History of anemia secondary to chronic disease and Gleevec therapy.  21. Anorexia-potentially related to [place]. He reports Medicaid would not pay for Megace. He has gained weight 22. Chronic left knee and left foot pain. He takes hydrocodone as needed 23. Status post removal of a  left knee screw 07/03/2012. 24. Rash when here 02/12/2013-? Insect bites.  Disposition-he appears stable. The peripheral blood PCR was improved in October. He will continue Gleevec. He will return for labs and a followup visit in approximately 2-3 months. He will increase potassium to twice daily as previously prescribed.  He would like to have HIV testing at his next lab appointment.  He will contact the office prior to his next visit with any problems.  Plan reviewed with Dr. [name].   [name] ANP/GNP-BC

## 2013-04-30 ENCOUNTER — Telehealth: Payer: Self-pay | Admitting: *Deleted

## 2013-04-30 NOTE — Telephone Encounter (Signed)
Called and informed patient of low potassium.  Asked patient if he has been taking his potassium daily.  Patient stated he takes 20 mEq twice daily.  Informed patient to increase potassium to 20 mEq three times daily.  Per Dr. [name].  Patient verbalized understanding.

## 2013-04-30 NOTE — Telephone Encounter (Signed)
Message copied by Norma Fredrickson on Tue Apr 30, 2013  9:12 AM ------      Message from: [name] B      Created: Mon Apr 29, 2013  8:54 PM       Please call patient,the potassium is low. Ask if he is taking potassium. If he has been taking potassium consistently, then increase the potassium chloride to 20 mEq 3 times daily ------

## 2013-05-07 ENCOUNTER — Telehealth: Payer: Self-pay | Admitting: *Deleted

## 2013-05-07 NOTE — Telephone Encounter (Signed)
Called and informed patient that pcr is better and to continue gleevec as prescribed.  Per Dr. [name].  Patient verbalized understanding.  Patient stated he did not want to continue with HIV testing.  Informed Elby Showers. Marcello Moores, NP and Dr. [name], that patient does not want to continue with testing.

## 2013-05-07 NOTE — Telephone Encounter (Signed)
Message copied by Norma Fredrickson on Tue May 07, 2013  9:52 AM ------      Message from: [name]      Created: Mon May 06, 2013  8:18 PM       Please call patient, pcr is better, continue gleevec ------

## 2013-05-07 NOTE — Telephone Encounter (Signed)
Left message for patient to call back regarding lab results and to continue gleevec.  Per Dr. [name].

## 2013-06-25 ENCOUNTER — Telehealth: Payer: Self-pay | Admitting: Oncology

## 2013-06-25 NOTE — Telephone Encounter (Signed)
Talked to pt and gave him appt for 07/08/13 lab and ML

## 2013-07-08 ENCOUNTER — Ambulatory Visit: Payer: Medicare Other | Admitting: Nurse Practitioner

## 2013-07-08 ENCOUNTER — Other Ambulatory Visit: Payer: Medicare Other

## 2013-07-08 ENCOUNTER — Ambulatory Visit: Payer: Medicare Other | Admitting: Oncology

## 2013-07-09 ENCOUNTER — Encounter (INDEPENDENT_AMBULATORY_CARE_PROVIDER_SITE_OTHER): Payer: Self-pay

## 2013-07-09 ENCOUNTER — Other Ambulatory Visit (HOSPITAL_BASED_OUTPATIENT_CLINIC_OR_DEPARTMENT_OTHER): Payer: Medicare Other

## 2013-07-09 ENCOUNTER — Ambulatory Visit (HOSPITAL_BASED_OUTPATIENT_CLINIC_OR_DEPARTMENT_OTHER): Payer: Medicare Other | Admitting: Nurse Practitioner

## 2013-07-09 VITALS — BP 155/94 | HR 80 | Temp 97.1°F | Resp 18 | Ht 69.0 in | Wt 159.3 lb

## 2013-07-09 DIAGNOSIS — C9210 Chronic myeloid leukemia, BCR/ABL-positive, not having achieved remission: Secondary | ICD-10-CM

## 2013-07-09 DIAGNOSIS — F3289 Other specified depressive episodes: Secondary | ICD-10-CM

## 2013-07-09 DIAGNOSIS — R197 Diarrhea, unspecified: Secondary | ICD-10-CM

## 2013-07-09 DIAGNOSIS — F329 Major depressive disorder, single episode, unspecified: Secondary | ICD-10-CM

## 2013-07-09 DIAGNOSIS — E876 Hypokalemia: Secondary | ICD-10-CM

## 2013-07-09 DIAGNOSIS — C9211 Chronic myeloid leukemia, BCR/ABL-positive, in remission: Secondary | ICD-10-CM

## 2013-07-09 LAB — CBC WITH DIFFERENTIAL/PLATELET
BASO%: 1 % (ref 0.0–2.0)
BASOS ABS: 0 10*3/uL (ref 0.0–0.1)
EOS%: 6.8 % (ref 0.0–7.0)
Eosinophils Absolute: 0.3 10*3/uL (ref 0.0–0.5)
HEMATOCRIT: 40 % (ref 38.4–49.9)
HEMOGLOBIN: 12.7 g/dL — AB (ref 13.0–17.1)
LYMPH%: 24.5 % (ref 14.0–49.0)
MCH: 25.8 pg — AB (ref 27.2–33.4)
MCHC: 31.6 g/dL — ABNORMAL LOW (ref 32.0–36.0)
MCV: 81.7 fL (ref 79.3–98.0)
MONO#: 0.4 10*3/uL (ref 0.1–0.9)
MONO%: 7.9 % (ref 0.0–14.0)
NEUT#: 2.9 10*3/uL (ref 1.5–6.5)
NEUT%: 59.8 % (ref 39.0–75.0)
Platelets: 172 10*3/uL (ref 140–400)
RBC: 4.9 10*6/uL (ref 4.20–5.82)
RDW: 15.8 % — ABNORMAL HIGH (ref 11.0–14.6)
WBC: 4.9 10*3/uL (ref 4.0–10.3)
lymph#: 1.2 10*3/uL (ref 0.9–3.3)

## 2013-07-09 LAB — COMPREHENSIVE METABOLIC PANEL (CC13)
ALK PHOS: 69 U/L (ref 40–150)
ALT: 13 U/L (ref 0–55)
AST: 18 U/L (ref 5–34)
Albumin: 3.9 g/dL (ref 3.5–5.0)
Anion Gap: 13 mEq/L — ABNORMAL HIGH (ref 3–11)
BUN: 15 mg/dL (ref 7.0–26.0)
CALCIUM: 9.9 mg/dL (ref 8.4–10.4)
CO2: 26 mEq/L (ref 22–29)
CREATININE: 1.3 mg/dL (ref 0.7–1.3)
Chloride: 107 mEq/L (ref 98–109)
Glucose: 101 mg/dl (ref 70–140)
Potassium: 3.2 mEq/L — ABNORMAL LOW (ref 3.5–5.1)
Sodium: 146 mEq/L — ABNORMAL HIGH (ref 136–145)
Total Bilirubin: 0.29 mg/dL (ref 0.20–1.20)
Total Protein: 6.8 g/dL (ref 6.4–8.3)

## 2013-07-09 LAB — HIV ANTIBODY (ROUTINE TESTING W REFLEX): HIV: NONREACTIVE

## 2013-07-09 NOTE — Progress Notes (Signed)
OFFICE PROGRESS NOTE  Interval history:  [name] returns for followup of CML. He continues [place]. He has occasional nausea. He has intermittent loose stools. He takes Lomotil as needed with good results. No skin rash. No recent leg swelling. He denies periorbital edema. He is having intermittent arthralgias involving multiple joints.   Objective: Filed Vitals:   07/09/13 1444  BP: 155/94  Pulse: 80  Temp: 97.1 F (36.2 C)  Resp: 18   No thrush or ulceration. Lungs clear. Regular cardiac rhythm. Abdomen soft and nontender. No left leg edema.   Lab Results: Lab Results  Component Value Date   WBC 4.9 07/09/2013   HGB 12.7* 07/09/2013   HCT 40.0 07/09/2013   MCV 81.7 07/09/2013   PLT 172 07/09/2013   NEUTROABS 2.9 07/09/2013    Chemistry:    Chemistry      Component Value Date/Time   NA 146* 07/09/2013 1425   NA 142 09/19/2012 0937   K 3.2* 07/09/2013 1425   K 3.0* 09/19/2012 0937   CL 100 09/19/2012 0937   CL 105 08/07/2012 1339   CO2 26 07/09/2013 1425   CO2 30 09/19/2012 0937   BUN 15.0 07/09/2013 1425   BUN 17 09/19/2012 0937   CREATININE 1.3 07/09/2013 1425   CREATININE 1.25 09/19/2012 0937      Component Value Date/Time   CALCIUM 9.9 07/09/2013 1425   CALCIUM 9.4 09/19/2012 0937   ALKPHOS 69 07/09/2013 1425   ALKPHOS 80 09/19/2012 0937   AST 18 07/09/2013 1425   AST 18 09/19/2012 0937   ALT 13 07/09/2013 1425   ALT 13 09/19/2012 0937   BILITOT 0.29 07/09/2013 1425   BILITOT 0.6 09/19/2012 0937    04/29/2013 BCR-ABL 12.2%.   Studies/Results: No results found.  Medications: I have reviewed the patient's current medications.  Assessment/Plan: 1. Chronic myelogenous leukemia, diagnosed in January of 2009. He remains in hematologic remission. He is taking Gleevec at a dose of 200 mg daily. The peripheral blood PCR was markedly increased in May 2013, likely reflecting medical noncompliance. The peripheral blood PCR was significantly improved on 11/07/2011. The peripheral blood PCR was further  improved on 01/31/2012. The peripheral blood PCR was increased on 04/27/2012; stable on 11/12/2012, improved on 02/12/2013; increased on 04/29/2013. 2. 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 3. Status post C3-C4, C4-C5, anterior cervical diskectomy and fusion with allograft and plating 09/30/2010. 4. 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 5. Depression, maintained on Lexapro. 6. Diarrhea, ? related to chronic pancreatitis versus Gleevec. He takes Lomotil as needed.  7. Indurated facial skin lesion 11/20/2007 with a history of MRSA skin infection of the submental area in May 2008. The induration resolved with doxycycline. 8. Left knee arthroscopy 05/25/2007. 9. Postoperative left knee effusion/pain, likely related to gout. 10. History of gout. He continues colchicine and allopurinol. 11. Chronic pancreatitis. 12. Status post right above-the-knee amputation. 13. MRSA infection of the submental area May 2008. 14. History of tobacco, alcohol, and cocaine use. 15. History of coronary artery disease. 16. "Shotty" lymphadenopathy of the neck, axilla, and left groin in 2009.  17. History of a microcytic anemia.  18. History of a right olecranon bursa lesion, ? gouty tophus. 19. 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 He previously took AndroGel. 20. History of anemia secondary to chronic disease and Gleevec therapy.  21. Anorexia-potentially related to [place]. He reports Medicaid would not pay for Megace. He has gained weight  22. Chronic left knee and left foot pain. He takes hydrocodone as needed 23. Status post removal of a left knee screw 07/03/2012. 24. Rash when here 02/12/2013-? Insect bites. 25. Hypokalemia. Likely related to diarrhea. He reports poor compliance with oral potassium.   Dispositon-he appears stable. The peripheral blood PCR was slightly increased December 2014. He will continue Gleevec. We will repeat  the peripheral blood PCR in 2 months.  He will try to take the potassium twice daily as previously prescribed.  We will see him in followup in 2 months. He will contact the office in the interim with any problems. He will contact his primary care provider regarding the arthralgias.  Plan reviewed with Dr. [name].   [name] ANP/GNP-BC

## 2013-07-10 ENCOUNTER — Telehealth: Payer: Self-pay | Admitting: Oncology

## 2013-07-10 NOTE — Telephone Encounter (Signed)
s.w. pt and advised on may appt.Marland KitchenMarland KitchenMarland KitchenMarland Kitchenpt ok and aware

## 2013-07-11 ENCOUNTER — Telehealth: Payer: Self-pay | Admitting: *Deleted

## 2013-07-11 NOTE — Telephone Encounter (Signed)
Message copied by Brien Few on Thu Jul 11, 2013  9:12 AM ------      Message from: [name] B      Created: Wed Jul 10, 2013  8:12 PM       Please call patient, hiv is negative ------

## 2013-07-11 NOTE — Telephone Encounter (Signed)
Pt returned call, lab results given. He voiced appreciation for call.

## 2013-08-06 ENCOUNTER — Ambulatory Visit: Payer: Medicare Other | Admitting: Oncology

## 2013-09-16 ENCOUNTER — Other Ambulatory Visit (HOSPITAL_BASED_OUTPATIENT_CLINIC_OR_DEPARTMENT_OTHER): Payer: Medicare Other

## 2013-09-16 ENCOUNTER — Ambulatory Visit (HOSPITAL_BASED_OUTPATIENT_CLINIC_OR_DEPARTMENT_OTHER): Payer: Medicare Other | Admitting: Oncology

## 2013-09-16 VITALS — BP 161/93 | HR 81 | Temp 98.0°F | Resp 20 | Ht 69.0 in | Wt 162.5 lb

## 2013-09-16 DIAGNOSIS — C9211 Chronic myeloid leukemia, BCR/ABL-positive, in remission: Secondary | ICD-10-CM

## 2013-09-16 DIAGNOSIS — F329 Major depressive disorder, single episode, unspecified: Secondary | ICD-10-CM

## 2013-09-16 DIAGNOSIS — F3289 Other specified depressive episodes: Secondary | ICD-10-CM

## 2013-09-16 DIAGNOSIS — E876 Hypokalemia: Secondary | ICD-10-CM

## 2013-09-16 DIAGNOSIS — C9210 Chronic myeloid leukemia, BCR/ABL-positive, not having achieved remission: Secondary | ICD-10-CM

## 2013-09-16 LAB — COMPREHENSIVE METABOLIC PANEL (CC13)
ALBUMIN: 3.6 g/dL (ref 3.5–5.0)
ALT: 11 U/L (ref 0–55)
ANION GAP: 12 meq/L — AB (ref 3–11)
AST: 14 U/L (ref 5–34)
Alkaline Phosphatase: 73 U/L (ref 40–150)
BUN: 12.6 mg/dL (ref 7.0–26.0)
CALCIUM: 9.5 mg/dL (ref 8.4–10.4)
CHLORIDE: 108 meq/L (ref 98–109)
CO2: 23 meq/L (ref 22–29)
Creatinine: 1.2 mg/dL (ref 0.7–1.3)
GLUCOSE: 129 mg/dL (ref 70–140)
POTASSIUM: 3.2 meq/L — AB (ref 3.5–5.1)
Sodium: 143 mEq/L (ref 136–145)
Total Bilirubin: 0.36 mg/dL (ref 0.20–1.20)
Total Protein: 6.3 g/dL — ABNORMAL LOW (ref 6.4–8.3)

## 2013-09-16 LAB — CBC WITH DIFFERENTIAL/PLATELET
BASO%: 0.9 % (ref 0.0–2.0)
Basophils Absolute: 0 10*3/uL (ref 0.0–0.1)
EOS ABS: 0.4 10*3/uL (ref 0.0–0.5)
EOS%: 7.7 % — ABNORMAL HIGH (ref 0.0–7.0)
HCT: 39.3 % (ref 38.4–49.9)
HGB: 12.5 g/dL — ABNORMAL LOW (ref 13.0–17.1)
LYMPH#: 1.2 10*3/uL (ref 0.9–3.3)
LYMPH%: 23.7 % (ref 14.0–49.0)
MCH: 25.8 pg — ABNORMAL LOW (ref 27.2–33.4)
MCHC: 31.8 g/dL — AB (ref 32.0–36.0)
MCV: 81 fL (ref 79.3–98.0)
MONO#: 0.4 10*3/uL (ref 0.1–0.9)
MONO%: 8.1 % (ref 0.0–14.0)
NEUT%: 59.6 % (ref 39.0–75.0)
NEUTROS ABS: 3.1 10*3/uL (ref 1.5–6.5)
Platelets: 172 10*3/uL (ref 140–400)
RBC: 4.86 10*6/uL (ref 4.20–5.82)
RDW: 15.3 % — AB (ref 11.0–14.6)
WBC: 5.1 10*3/uL (ref 4.0–10.3)

## 2013-09-16 MED ORDER — HYDROCODONE-ACETAMINOPHEN 5-325 MG PO TABS: 1.0000 | ORAL_TABLET | Freq: Two times a day (BID) | ORAL | Status: DC | PRN

## 2013-09-16 NOTE — Progress Notes (Signed)
[facility] OFFICE PROGRESS NOTE   Diagnosis: CML  INTERVAL HISTORY:   He returns as scheduled. He continues Gleevec at a dose of 200 mg daily. He is taking potassium once daily. He has a few loose stools per day. Mr. Gelfand complains of acute "gout "pain in the left knee today. He is scheduled to see Dr. Jeanie Cooks later today.  Objective:  Vital signs in last 24 hours:  Blood pressure 161/93, pulse 81, temperature 98 F (36.7 C), temperature source Oral, resp. rate 20, height 5\' 9"  (1.753 m), weight 162 lb 8 oz (73.71 kg), SpO2 99.00%.    HEENT: No thrush or ulcers Resp: Lungs clear bilaterally Cardio: Regular rate and rhythm GI: No hepatosplenomegaly, nontender Vascular: No left leg edema  Musculoskeletal: Pain with motion at the left knee     Lab Results:  Lab Results  Component Value Date   WBC 5.1 09/16/2013   HGB 12.5* 09/16/2013   HCT 39.3 09/16/2013   MCV 81.0 09/16/2013   PLT 172 09/16/2013   NEUTROABS 3.1 09/16/2013   Potassium 3.2, creatinine 1.2   Medications: I have reviewed the patient's current medications.  Assessment/Plan: 1. Chronic myelogenous leukemia, diagnosed in January of 2009. He remains in hematologic remission. He is taking Gleevec at a dose of 200 mg daily. The peripheral blood PCR was markedly increased in May 2013, likely reflecting medical noncompliance. The peripheral blood PCR was significantly improved on 11/07/2011. The peripheral blood PCR was further improved on 01/31/2012. The peripheral blood PCR was increased on 04/27/2012; stable on 11/12/2012, improved on 02/12/2013; increased on 04/29/2013. 2. 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 3. Status post C3-C4, C4-C5, anterior cervical diskectomy and fusion with allograft and plating 09/30/2010. 4. 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 5. Depression, maintained on Lexapro. 6. Diarrhea, ? related to  chronic pancreatitis versus Gleevec. He takes Lomotil as needed.  7. Indurated facial skin lesion 11/20/2007 with a history of MRSA skin infection of the submental area in May 2008. The induration resolved with doxycycline. 8. Left knee arthroscopy 05/25/2007. 9. Postoperative left knee effusion/pain, likely related to gout. 10. History of gout. No longer taking allopurinol and colchicine 11. Chronic pancreatitis. 12. Status post right above-the-knee amputation. 13. MRSA infection of the submental area May 2008. 14. History of tobacco, alcohol, and cocaine use. 15. History of coronary artery disease. 16. "Shotty" lymphadenopathy of the neck, axilla, and left groin in 2009.  17. History of a microcytic anemia.  18. History of a right olecranon bursa lesion, ? gouty tophus. 19. 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 He previously took AndroGel. 20. History of anemia secondary to chronic disease and Gleevec therapy.  21. Anorexia-potentially related to [place]. He reports Medicaid would not pay for Megace. He has gained weight 22. Chronic left knee and left foot pain. He takes hydrocodone as needed 23. Status post removal of a left knee screw 07/03/2012. 24. Hypokalemia. Likely related to diarrhea. He will resume potassium at a dose of 20 mEq twice daily  Disposition:  Mr. Gens continues [place]. We will followup on the peripheral blood PCR from today and recommend continuing Gleevec versus switching to a different tyrosine kinase inhibitor.  He will resume twice daily potassium. I gave him a prescription for 10 hydrocodone pills to take for the left knee gout flare. He will see Dr. Jeanie Cooks later today.  Mr. Bruski will return for an office and lab visit in  6 weeks.  Ladell Pier, MD  09/16/2013  11:04 AM

## 2013-09-21 ENCOUNTER — Telehealth: Payer: Self-pay | Admitting: Oncology

## 2013-09-21 NOTE — Telephone Encounter (Signed)
s.w. pt and advised on June appt....pt ok and aware °

## 2013-09-24 ENCOUNTER — Telehealth: Payer: Self-pay | Admitting: *Deleted

## 2013-09-24 NOTE — Telephone Encounter (Signed)
Per Dr. [name]; notified pt that bcr is better, continue gleevec at current dose.  Pt verbalized understanding and confirmed appt for 10/28/13.

## 2013-09-24 NOTE — Telephone Encounter (Signed)
Message copied by Domenic Schwab on Tue Sep 24, 2013  2:30 PM ------      Message from: Ladell Pier      Created: Fri Sep 20, 2013  4:55 PM       Please call patient, pcr is better, continue gleevec at current dose ------

## 2013-10-28 ENCOUNTER — Ambulatory Visit (HOSPITAL_BASED_OUTPATIENT_CLINIC_OR_DEPARTMENT_OTHER): Payer: Medicare Other | Admitting: Nurse Practitioner

## 2013-10-28 ENCOUNTER — Other Ambulatory Visit (HOSPITAL_BASED_OUTPATIENT_CLINIC_OR_DEPARTMENT_OTHER): Payer: Medicare Other

## 2013-10-28 VITALS — BP 164/91 | HR 69 | Temp 97.8°F | Resp 18 | Ht 69.0 in | Wt 162.4 lb

## 2013-10-28 DIAGNOSIS — E876 Hypokalemia: Secondary | ICD-10-CM

## 2013-10-28 DIAGNOSIS — C9210 Chronic myeloid leukemia, BCR/ABL-positive, not having achieved remission: Secondary | ICD-10-CM

## 2013-10-28 DIAGNOSIS — C9211 Chronic myeloid leukemia, BCR/ABL-positive, in remission: Secondary | ICD-10-CM

## 2013-10-28 DIAGNOSIS — R197 Diarrhea, unspecified: Secondary | ICD-10-CM

## 2013-10-28 LAB — CBC WITH DIFFERENTIAL/PLATELET
BASO%: 0.8 % (ref 0.0–2.0)
Basophils Absolute: 0 10*3/uL (ref 0.0–0.1)
EOS ABS: 0.3 10*3/uL (ref 0.0–0.5)
EOS%: 6.9 % (ref 0.0–7.0)
HCT: 38.8 % (ref 38.4–49.9)
HGB: 12.4 g/dL — ABNORMAL LOW (ref 13.0–17.1)
LYMPH%: 25.2 % (ref 14.0–49.0)
MCH: 25.9 pg — ABNORMAL LOW (ref 27.2–33.4)
MCHC: 31.8 g/dL — ABNORMAL LOW (ref 32.0–36.0)
MCV: 81.4 fL (ref 79.3–98.0)
MONO#: 0.4 10*3/uL (ref 0.1–0.9)
MONO%: 7.7 % (ref 0.0–14.0)
NEUT%: 59.4 % (ref 39.0–75.0)
NEUTROS ABS: 3 10*3/uL (ref 1.5–6.5)
PLATELETS: 205 10*3/uL (ref 140–400)
RBC: 4.77 10*6/uL (ref 4.20–5.82)
RDW: 15.7 % — ABNORMAL HIGH (ref 11.0–14.6)
WBC: 5 10*3/uL (ref 4.0–10.3)
lymph#: 1.3 10*3/uL (ref 0.9–3.3)

## 2013-10-28 LAB — COMPREHENSIVE METABOLIC PANEL (CC13)
ALT: 21 U/L (ref 0–55)
ANION GAP: 11 meq/L (ref 3–11)
AST: 26 U/L (ref 5–34)
Albumin: 3.8 g/dL (ref 3.5–5.0)
Alkaline Phosphatase: 68 U/L (ref 40–150)
BILIRUBIN TOTAL: 0.65 mg/dL (ref 0.20–1.20)
BUN: 9.8 mg/dL (ref 7.0–26.0)
CALCIUM: 9.1 mg/dL (ref 8.4–10.4)
CO2: 27 meq/L (ref 22–29)
CREATININE: 1.2 mg/dL (ref 0.7–1.3)
Chloride: 103 mEq/L (ref 98–109)
GLUCOSE: 106 mg/dL (ref 70–140)
Potassium: 3 mEq/L — CL (ref 3.5–5.1)
SODIUM: 141 meq/L (ref 136–145)
TOTAL PROTEIN: 6.4 g/dL (ref 6.4–8.3)

## 2013-10-28 MED ORDER — POTASSIUM CHLORIDE CRYS ER 20 MEQ PO TBCR: 20.0000 meq | EXTENDED_RELEASE_TABLET | Freq: Two times a day (BID) | ORAL | Status: DC

## 2013-10-28 NOTE — Progress Notes (Signed)
  [name]   Diagnosis:  CML.  INTERVAL HISTORY:   [name] returns as scheduled. He continues Gleevec 200 mg daily. He has intermittent nausea/vomiting. He continues to have loose stools. He takes Imodium as needed. Appetite varies. He "ran out" of potassium about 1 month ago.  Objective:  Vital signs in last 24 hours:  Blood pressure 164/91, pulse 69, temperature 97.8 F (36.6 C), temperature source Oral, resp. rate 18, height 5\' 9"  (1.753 m), weight 162 lb 6.4 oz (73.664 kg).    HEENT: No thrush or ulcerations. Resp: Lungs clear. Cardio: Regular cardiac rhythm. GI: Abdomen soft and nontender. No organomegaly. Vascular: No left leg edema.    Lab Results:  Lab Results  Component Value Date   WBC 5.0 10/28/2013   HGB 12.4* 10/28/2013   HCT 38.8 10/28/2013   MCV 81.4 10/28/2013   PLT 205 10/28/2013   NEUTROABS 3.0 10/28/2013    Imaging:  No results found.  Medications: I have reviewed the patient's current medications.  Assessment/Plan: 1. Chronic myelogenous leukemia, diagnosed in January of 2009. He remains in hematologic remission. He is taking Gleevec at a dose of 200 mg daily. The peripheral blood PCR was markedly increased in May 2013, likely reflecting medical noncompliance. The peripheral blood PCR was significantly improved on 11/07/2011. The peripheral blood PCR was further improved on 01/31/2012. The peripheral blood PCR was increased on 04/27/2012; stable on 11/12/2012, improved on 02/12/2013; increased on 04/29/2013; increased on 07/10/2013; improved on 09/16/2013. 2. 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 3. Status post C3-C4, C4-C5, anterior cervical diskectomy and fusion with allograft and plating 09/30/2010. 4. 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 5. Depression, maintained on Lexapro. 6. Diarrhea, ? related to chronic pancreatitis versus  Gleevec. He takes Lomotil as needed.  7. Indurated facial skin lesion 11/20/2007 with a history of MRSA skin infection of the submental area in May 2008. The induration resolved with doxycycline. 8. Left knee arthroscopy 05/25/2007. 9. Postoperative left knee effusion/pain, likely related to gout. 10. History of gout. No longer taking allopurinol and colchicine 11. Chronic pancreatitis. 12. Status post right above-the-knee amputation. 13. MRSA infection of the submental area May 2008. 14. History of tobacco, alcohol, and cocaine use. 15. History of coronary artery disease. 16. "Shotty" lymphadenopathy of the neck, axilla, and left groin in 2009.  17. History of a microcytic anemia.  18. History of a right olecranon bursa lesion, ? gouty tophus. 19. 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 He previously took AndroGel. 20. History of anemia secondary to chronic disease and Gleevec therapy.  21. Anorexia-potentially related to [place]. He reports Medicaid would not pay for Megace. He has gained weight 22. Chronic left knee and left foot pain. He takes hydrocodone as needed 23. Status post removal of a left knee screw 07/03/2012. 24. Hypokalemia. Likely related to diarrhea. He is not currently taking potassium.  Disposition: [name] appears stable. He continues [place]. The recent peripheral blood PCR was improved.  He is hypokalemic. He will resume potassium twice daily. We sent a new prescription to his pharmacy.  We will obtain a repeat basic metabolic panel in 2 weeks. He will return for a followup visit in 6 weeks.  Plan reviewed with Dr. [name].    [name] ANP/GNP-BC   10/28/2013  2:30 PM

## 2013-10-30 ENCOUNTER — Telehealth: Payer: Self-pay | Admitting: Oncology

## 2013-10-30 NOTE — Telephone Encounter (Signed)
, °

## 2013-11-05 ENCOUNTER — Telehealth: Payer: Self-pay | Admitting: Nurse Practitioner

## 2013-11-05 NOTE — Telephone Encounter (Signed)
Per Dr. [name], patient informed PCR is better, continue Gleevec, and follow up as scheduled. Patient encouraged to call clinic with any questions or concerns. Patient verbalizes understanding and thanked for the call.

## 2013-11-05 NOTE — Telephone Encounter (Signed)
Message copied by Alla Feeling on Tue Nov 05, 2013  4:13 PM ------      Message from: Tania Ade      Created: Tue Nov 05, 2013  3:56 PM                   ----- Message -----         From: Ladell Pier, MD         Sent: 11/03/2013   6:56 PM           To: Tania Ade, RN, Ludwig Lean, RN, #            Please call patient, pcr is better, continue  Gleevec, f/u as scheduled ------

## 2013-11-06 ENCOUNTER — Telehealth: Payer: Self-pay | Admitting: Medical Oncology

## 2013-11-06 NOTE — Telephone Encounter (Signed)
Pt notified.Confirmed next appointment.

## 2013-11-06 NOTE — Telephone Encounter (Signed)
Message copied by Ardeen Garland on Wed Nov 06, 2013  9:55 AM ------      Message from: Brien Few      Created: Wed Nov 06, 2013  9:50 AM                   ----- Message -----         From: Ladell Pier, MD         Sent: 11/03/2013   6:56 PM           To: Tania Ade, RN, Ludwig Lean, RN, #            Please call patient, pcr is better, continue  Gleevec, f/u as scheduled ------

## 2013-12-09 ENCOUNTER — Other Ambulatory Visit: Payer: Medicare Other

## 2013-12-09 ENCOUNTER — Ambulatory Visit: Payer: Medicare Other | Admitting: Oncology

## 2013-12-21 ENCOUNTER — Emergency Department (HOSPITAL_COMMUNITY): Payer: Medicare Other

## 2013-12-21 ENCOUNTER — Encounter (HOSPITAL_COMMUNITY): Payer: Self-pay | Admitting: Emergency Medicine

## 2013-12-21 ENCOUNTER — Emergency Department (HOSPITAL_COMMUNITY)
Admission: EM | Admit: 2013-12-21 | Discharge: 2013-12-21 | Disposition: A | Discharge status: Home / Self Care | Payer: Medicare Other | Attending: Emergency Medicine | Admitting: Emergency Medicine

## 2013-12-21 DIAGNOSIS — K921 Melena: Secondary | ICD-10-CM | POA: Insufficient documentation

## 2013-12-21 DIAGNOSIS — Z862 Personal history of diseases of the blood and blood-forming organs and certain disorders involving the immune mechanism: Secondary | ICD-10-CM | POA: Diagnosis not present

## 2013-12-21 DIAGNOSIS — Z9889 Other specified postprocedural states: Secondary | ICD-10-CM | POA: Insufficient documentation

## 2013-12-21 DIAGNOSIS — R112 Nausea with vomiting, unspecified: Secondary | ICD-10-CM | POA: Insufficient documentation

## 2013-12-21 DIAGNOSIS — Z9861 Coronary angioplasty status: Secondary | ICD-10-CM | POA: Diagnosis not present

## 2013-12-21 DIAGNOSIS — K5900 Constipation, unspecified: Secondary | ICD-10-CM | POA: Diagnosis not present

## 2013-12-21 DIAGNOSIS — Z856 Personal history of leukemia: Secondary | ICD-10-CM | POA: Diagnosis not present

## 2013-12-21 DIAGNOSIS — I10 Essential (primary) hypertension: Secondary | ICD-10-CM | POA: Insufficient documentation

## 2013-12-21 DIAGNOSIS — Z87891 Personal history of nicotine dependence: Secondary | ICD-10-CM | POA: Diagnosis not present

## 2013-12-21 DIAGNOSIS — I252 Old myocardial infarction: Secondary | ICD-10-CM | POA: Diagnosis not present

## 2013-12-21 DIAGNOSIS — Z7902 Long term (current) use of antithrombotics/antiplatelets: Secondary | ICD-10-CM | POA: Diagnosis not present

## 2013-12-21 DIAGNOSIS — Z87828 Personal history of other (healed) physical injury and trauma: Secondary | ICD-10-CM | POA: Insufficient documentation

## 2013-12-21 DIAGNOSIS — Z79899 Other long term (current) drug therapy: Secondary | ICD-10-CM | POA: Insufficient documentation

## 2013-12-21 DIAGNOSIS — R1111 Vomiting without nausea: Secondary | ICD-10-CM

## 2013-12-21 DIAGNOSIS — M109 Gout, unspecified: Secondary | ICD-10-CM | POA: Insufficient documentation

## 2013-12-21 LAB — URINALYSIS, ROUTINE W REFLEX MICROSCOPIC
Bilirubin Urine: NEGATIVE
Glucose, UA: NEGATIVE mg/dL
Hgb urine dipstick: NEGATIVE
Ketones, ur: NEGATIVE mg/dL
Leukocytes, UA: NEGATIVE
NITRITE: NEGATIVE
Specific Gravity, Urine: 1.016 (ref 1.005–1.030)
Urobilinogen, UA: 1 mg/dL (ref 0.0–1.0)
pH: 7.5 (ref 5.0–8.0)

## 2013-12-21 LAB — CBC WITH DIFFERENTIAL/PLATELET
Basophils Absolute: 0 10*3/uL (ref 0.0–0.1)
Basophils Relative: 0 % (ref 0–1)
EOS ABS: 0.1 10*3/uL (ref 0.0–0.7)
EOS PCT: 2 % (ref 0–5)
HEMATOCRIT: 33.4 % — AB (ref 39.0–52.0)
Hemoglobin: 10.7 g/dL — ABNORMAL LOW (ref 13.0–17.0)
LYMPHS ABS: 0.5 10*3/uL — AB (ref 0.7–4.0)
Lymphocytes Relative: 9 % — ABNORMAL LOW (ref 12–46)
MCH: 25.8 pg — AB (ref 26.0–34.0)
MCHC: 32 g/dL (ref 30.0–36.0)
MCV: 80.5 fL (ref 78.0–100.0)
MONO ABS: 0.5 10*3/uL (ref 0.1–1.0)
Monocytes Relative: 8 % (ref 3–12)
Neutro Abs: 5 10*3/uL (ref 1.7–7.7)
Neutrophils Relative %: 81 % — ABNORMAL HIGH (ref 43–77)
PLATELETS: 202 10*3/uL (ref 150–400)
RBC: 4.15 MIL/uL — AB (ref 4.22–5.81)
RDW: 15.8 % — AB (ref 11.5–15.5)
WBC: 6.1 10*3/uL (ref 4.0–10.5)

## 2013-12-21 LAB — LIPASE, BLOOD: LIPASE: 14 U/L (ref 11–59)

## 2013-12-21 LAB — POC OCCULT BLOOD, ED: FECAL OCCULT BLD: NEGATIVE

## 2013-12-21 LAB — COMPREHENSIVE METABOLIC PANEL
ALT: 17 U/L (ref 0–53)
ANION GAP: 14 (ref 5–15)
AST: 24 U/L (ref 0–37)
Albumin: 3.3 g/dL — ABNORMAL LOW (ref 3.5–5.2)
Alkaline Phosphatase: 84 U/L (ref 39–117)
BUN: 10 mg/dL (ref 6–23)
CALCIUM: 8.9 mg/dL (ref 8.4–10.5)
CO2: 27 mEq/L (ref 19–32)
Chloride: 104 mEq/L (ref 96–112)
Creatinine, Ser: 0.86 mg/dL (ref 0.50–1.35)
GFR calc Af Amer: >90 mL/min (ref >90)
GFR, EST NON AFRICAN AMERICAN: 87 mL/min — AB (ref >90)
Glucose, Bld: 114 mg/dL — ABNORMAL HIGH (ref 70–99)
Potassium: 3.6 mEq/L — ABNORMAL LOW (ref 3.7–5.3)
SODIUM: 145 meq/L (ref 137–147)
TOTAL PROTEIN: 6.4 g/dL (ref 6.0–8.3)
Total Bilirubin: 0.5 mg/dL (ref 0.3–1.2)

## 2013-12-21 LAB — URINE MICROSCOPIC-ADD ON

## 2013-12-21 LAB — TROPONIN I: Troponin I: <0.3 ng/mL (ref <0.30)

## 2013-12-21 MED ORDER — SODIUM CHLORIDE 0.9 % IV BOLUS (SEPSIS)
1000.0000 mL | Freq: Once | INTRAVENOUS | Status: AC
  Administered 2013-12-21: 1000 mL via INTRAVENOUS

## 2013-12-21 MED ORDER — ACETAMINOPHEN 325 MG PO TABS
650.0000 mg | ORAL_TABLET | Freq: Once | ORAL | Status: AC
  Administered 2013-12-21: 650 mg via ORAL
  Filled 2013-12-21: qty 2

## 2013-12-21 MED ORDER — ONDANSETRON HCL 4 MG PO TABS: 4.0000 mg | ORAL_TABLET | Freq: Four times a day (QID) | ORAL | Status: DC

## 2013-12-21 MED ORDER — IOHEXOL 300 MG/ML  SOLN
50.0000 mL | Freq: Once | INTRAMUSCULAR | Status: AC | PRN
  Administered 2013-12-21: 50 mL via ORAL

## 2013-12-21 MED ORDER — ONDANSETRON 4 MG PO TBDP
4.0000 mg | ORAL_TABLET | Freq: Once | ORAL | Status: AC
  Administered 2013-12-21: 4 mg via ORAL
  Filled 2013-12-21: qty 1
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
## 2013-12-21 NOTE — ED Provider Notes (Signed)
Medical screening examination/treatment/procedure(s) were conducted as a shared visit with non-physician practitioner(s) and myself.  I personally evaluated the patient during the encounter.   EKG Interpretation   Date/Time:  Saturday December 21 2013 07:42:11 EDT Ventricular Rate:  85 PR Interval:  233 QRS Duration: 82 QT Interval:  388 QTC Calculation: 461 R Axis:   45 Text Interpretation:  Sinus rhythm No significant change was found  Confirmed by Wyvonnia Dusky  MD, STEPHEN [phone]) on 12/21/2013 7:48:11 AM      Pt is a 69 y.o. M with h/o HTN, CML, CAD, pancreatitis who presents emergency department with diffuse abdominal pain that started 3 days ago and vomiting. No fevers or chills. No diarrhea. He states that he feels it may be because he is taking penicillin for a recent left foot surgery by Dr. Sharol Given.  CT is passing gas. He also states this was similar to his prior pancreatitis. He is only mildly tender to palpation diffusely without any focal tenderness or guarding. No peritoneal signs. No distention or fluid wave. No tympany. Labs are unremarkable. No leukocytosis, normal LFTs and lipase. Abdominal x-ray shows diffuse stool in his colon, normal gas pattern. Suspect constipation secondary to narcotic use. We'll discharge home with bowel regimen.  [place], DO 12/21/13 [phone]

## 2013-12-21 NOTE — ED Notes (Signed)
Bed: [facility] Expected date:  Expected time:  Means of arrival:  Comments: 

## 2013-12-21 NOTE — ED Notes (Signed)
Per EMS, patient c/o vomiting x3 days. Patient states "I'm in pain, I feel like crap". Patient c/o bloating, no tenderness on palpitation. Emesis is bile in color.

## 2013-12-21 NOTE — Discharge Instructions (Signed)
Please call your doctor for a followup appointment within 24-48 hours. When you talk to your doctor please let them know that you were seen in the emergency department and have them acquire all of your records so that they can discuss the findings with you and formulate a treatment plan to fully care for your new and ongoing problems. Please call and set-up an appointment with your primary care provider to be seen and assessed Please rest and stay hydrated  Please avoid any physical or strenuous activity  Please take Zofran as needed for nausea as needed Please decrease intake of Percocets for this is what is leading to constipation - can alternate between tylenol and ibuprofen as needed for pain control Please follow high fiber diet to soften the stools Please increase water intake Please continue to monitor symptoms closely and if symptoms are to worsen or change (fever greater than 101, chills, sweating, nausea, vomiting, chest pain, shortness of breath, difficulty breathing, stomach pain, blood in the stools, black tarry stools, weakness, numbness, tingling, headache, dizziness, fainting) please report back to the ED immediately    Constipation Constipation is when a person:  Poops (has a bowel movement) less than 3 times a week.  Has a hard time pooping.  Has poop that is dry, hard, or bigger than normal. HOME CARE   Eat foods with a lot of fiber in them. This includes fruits, vegetables, beans, and whole grains such as brown rice.  Avoid fatty foods and foods with a lot of sugar. This includes french fries, hamburgers, cookies, candy, and soda.  If you are not getting enough fiber from food, take products with added fiber in them (supplements).  Drink enough fluid to keep your pee (urine) clear or pale yellow.  Exercise on a regular basis, or as told by your doctor.  Go to the restroom when you feel like you need to poop. Do not hold it.  Only take medicine as told by your  doctor. Do not take medicines that help you poop (laxatives) without talking to your doctor first. GET HELP RIGHT AWAY IF:   You have bright red blood in your poop (stool).  Your constipation lasts more than 4 days or gets worse.  You have belly (abdominal) or butt (rectal) pain.  You have thin poop (as thin as a pencil).  You lose weight, and it cannot be explained. MAKE SURE YOU:   Understand these instructions.  Will watch your condition.  Will get help right away if you are not doing well or get worse. Document Released: 10/12/2007 Document Revised: 04/30/2013 Document Reviewed: 02/04/2013 [facility] Patient Information 2015 [place], [place]. This information is not intended to replace advice given to you by your health care provider. Make sure you discuss any questions you have with your health care provider. High-Fiber Diet Fiber is found in fruits, vegetables, and grains. A high-fiber diet encourages the addition of more whole grains, legumes, fruits, and vegetables in your diet. The recommended amount of fiber for adult males is 38 g per day. For adult females, it is 25 g per day. Pregnant and lactating women should get 28 g of fiber per day. If you have a digestive or bowel problem, ask your caregiver for advice before adding high-fiber foods to your diet. Eat a variety of high-fiber foods instead of only a select few type of foods.  PURPOSE  To increase stool bulk.  To make bowel movements more regular to prevent constipation.  To lower cholesterol.  To prevent overeating. WHEN IS THIS DIET USED?  It may be used if you have constipation and hemorrhoids.  It may be used if you have uncomplicated diverticulosis (intestine condition) and irritable bowel syndrome.  It may be used if you need help with weight management.  It may be used if you want to add it to your diet as a protective measure against atherosclerosis, diabetes, and cancer. SOURCES OF FIBER  Whole-grain  breads and cereals.  Fruits, such as apples, oranges, bananas, berries, prunes, and pears.  Vegetables, such as green peas, carrots, sweet potatoes, beets, broccoli, cabbage, spinach, and artichokes.  Legumes, such split peas, soy, lentils.  Almonds. FIBER CONTENT IN FOODS Starches and Grains / Dietary Fiber (g)  Cheerios, 1 cup / 3 g  Corn Flakes cereal, 1 cup / 0.7 g  Rice crispy treat cereal, 1 cup / 0.3 g  Instant oatmeal (cooked),  cup / 2 g  Frosted wheat cereal, 1 cup / 5.1 g  Brown, long-grain rice (cooked), 1 cup / 3.5 g  White, long-grain rice (cooked), 1 cup / 0.6 g  Enriched macaroni (cooked), 1 cup / 2.5 g Legumes / Dietary Fiber (g)  Baked beans (canned, plain, or vegetarian),  cup / 5.2 g  Kidney beans (canned),  cup / 6.8 g  Pinto beans (cooked),  cup / 5.5 g Breads and Crackers / Dietary Fiber (g)  Plain or honey graham crackers, 2 squares / 0.7 g  Saltine crackers, 3 squares / 0.3 g  Plain, salted pretzels, 10 pieces / 1.8 g  Whole-wheat bread, 1 slice / 1.9 g  White bread, 1 slice / 0.7 g  Raisin bread, 1 slice / 1.2 g  Plain bagel, 3 oz / 2 g  Flour tortilla, 1 oz / 0.9 g  Corn tortilla, 1 small / 1.5 g  Hamburger or hotdog bun, 1 small / 0.9 g Fruits / Dietary Fiber (g)  Apple with skin, 1 medium / 4.4 g  Sweetened applesauce,  cup / 1.5 g  Banana,  medium / 1.5 g  Grapes, 10 grapes / 0.4 g  Orange, 1 small / 2.3 g  Raisin, 1.5 oz / 1.6 g  Melon, 1 cup / 1.4 g Vegetables / Dietary Fiber (g)  Green beans (canned),  cup / 1.3 g  Carrots (cooked),  cup / 2.3 g  Broccoli (cooked),  cup / 2.8 g  Peas (cooked),  cup / 4.4 g  Mashed potatoes,  cup / 1.6 g  Lettuce, 1 cup / 0.5 g  Corn (canned),  cup / 1.6 g  Tomato,  cup / 1.1 g Document Released: 04/25/2005 Document Revised: 10/25/2011 Document Reviewed: 07/28/2011 [employer] Patient Information 2015 [place], [place]. This information is not intended to  replace advice given to you by your health care provider. Make sure you discuss any questions you have with your health care provider.

## 2013-12-26 ENCOUNTER — Telehealth: Payer: Self-pay | Admitting: Oncology

## 2013-12-26 ENCOUNTER — Other Ambulatory Visit: Payer: Self-pay | Admitting: *Deleted

## 2013-12-26 DIAGNOSIS — C9210 Chronic myeloid leukemia, BCR/ABL-positive, not having achieved remission: Secondary | ICD-10-CM

## 2013-12-26 MED ORDER — IMATINIB MESYLATE 100 MG PO TABS: 200.0000 mg | ORAL_TABLET | Freq: Every day | ORAL | Status: DC

## 2013-12-26 NOTE — Telephone Encounter (Signed)
lvm for pt regarding to Aug appt....mailed pt appt sched ///avs and letter °

## 2014-01-06 ENCOUNTER — Ambulatory Visit (HOSPITAL_BASED_OUTPATIENT_CLINIC_OR_DEPARTMENT_OTHER): Payer: Medicare Other | Admitting: Nurse Practitioner

## 2014-01-06 ENCOUNTER — Other Ambulatory Visit (HOSPITAL_BASED_OUTPATIENT_CLINIC_OR_DEPARTMENT_OTHER): Payer: Medicare Other

## 2014-01-06 VITALS — BP 179/101 | HR 71 | Temp 98.0°F | Resp 18 | Ht 69.0 in | Wt 155.0 lb

## 2014-01-06 DIAGNOSIS — R6889 Other general symptoms and signs: Secondary | ICD-10-CM

## 2014-01-06 DIAGNOSIS — E876 Hypokalemia: Secondary | ICD-10-CM

## 2014-01-06 DIAGNOSIS — F329 Major depressive disorder, single episode, unspecified: Secondary | ICD-10-CM

## 2014-01-06 DIAGNOSIS — F3289 Other specified depressive episodes: Secondary | ICD-10-CM

## 2014-01-06 DIAGNOSIS — C9211 Chronic myeloid leukemia, BCR/ABL-positive, in remission: Secondary | ICD-10-CM

## 2014-01-06 DIAGNOSIS — C9210 Chronic myeloid leukemia, BCR/ABL-positive, not having achieved remission: Secondary | ICD-10-CM

## 2014-01-06 LAB — COMPREHENSIVE METABOLIC PANEL (CC13)
ALBUMIN: 3.2 g/dL — AB (ref 3.5–5.0)
ALK PHOS: 71 U/L (ref 40–150)
ALT: 12 U/L (ref 0–55)
AST: 14 U/L (ref 5–34)
Anion Gap: 10 mEq/L (ref 3–11)
BUN: 12.3 mg/dL (ref 7.0–26.0)
CHLORIDE: 110 meq/L — AB (ref 98–109)
CO2: 25 mEq/L (ref 22–29)
CREATININE: 1.2 mg/dL (ref 0.7–1.3)
Calcium: 8.7 mg/dL (ref 8.4–10.4)
GLUCOSE: 106 mg/dL (ref 70–140)
POTASSIUM: 3.2 meq/L — AB (ref 3.5–5.1)
Sodium: 144 mEq/L (ref 136–145)
Total Bilirubin: 0.43 mg/dL (ref 0.20–1.20)
Total Protein: 6 g/dL — ABNORMAL LOW (ref 6.4–8.3)

## 2014-01-06 LAB — CBC WITH DIFFERENTIAL/PLATELET
BASO%: 0.7 % (ref 0.0–2.0)
Basophils Absolute: 0 10*3/uL (ref 0.0–0.1)
EOS ABS: 0.4 10*3/uL (ref 0.0–0.5)
EOS%: 8.6 % — ABNORMAL HIGH (ref 0.0–7.0)
HEMATOCRIT: 34.7 % — AB (ref 38.4–49.9)
HEMOGLOBIN: 11.1 g/dL — AB (ref 13.0–17.1)
LYMPH%: 28.9 % (ref 14.0–49.0)
MCH: 25.8 pg — ABNORMAL LOW (ref 27.2–33.4)
MCHC: 32 g/dL (ref 32.0–36.0)
MCV: 80.7 fL (ref 79.3–98.0)
MONO#: 0.3 10*3/uL (ref 0.1–0.9)
MONO%: 6.7 % (ref 0.0–14.0)
NEUT%: 55.1 % (ref 39.0–75.0)
NEUTROS ABS: 2.4 10*3/uL (ref 1.5–6.5)
Platelets: 229 10*3/uL (ref 140–400)
RBC: 4.3 10*6/uL (ref 4.20–5.82)
RDW: 15.8 % — AB (ref 11.0–14.6)
WBC: 4.3 10*3/uL (ref 4.0–10.3)
lymph#: 1.3 10*3/uL (ref 0.9–3.3)

## 2014-01-06 MED ORDER — DIPHENOXYLATE-ATROPINE 2.5-0.025 MG PO TABS: 1.0000 | ORAL_TABLET | Freq: Four times a day (QID) | ORAL | Status: DC | PRN

## 2014-01-06 MED ORDER — PROCHLORPERAZINE MALEATE 5 MG PO TABS: 5.0000 mg | ORAL_TABLET | Freq: Four times a day (QID) | ORAL | Status: DC | PRN

## 2014-01-06 NOTE — Progress Notes (Addendum)
[place] OFFICE PROGRESS NOTE   Diagnosis:  CML.  INTERVAL HISTORY:   [name] continues Gleevec 200 mg daily. He takes potassium periodically. He has intermittent nausea/vomiting and loose stools. He "ran out" of many of his medications including Lomotil and Compazine. He reports undergoing left foot surgery about 2 weeks ago.  Objective:  Vital signs in last 24 hours:  Blood pressure 179/101, pulse 71, temperature 98 F (36.7 C), temperature source Oral, resp. rate 18, height 5\' 9"  (1.753 m), weight 155 lb (70.308 kg), SpO2 100.00%.    HEENT: No thrush or ulcers. Neck: Pulsatile fullness at the right low neck. Lymphatics: No palpable cervical or supraclavicular lymph nodes. Resp: Lungs clear bilaterally. Cardio: Regular rate and rhythm. GI: Abdomen soft and nontender. Vascular: No left leg edema. Left foot is bandaged.   Lab Results:  Lab Results  Component Value Date   WBC 4.3 01/06/2014   HGB 11.1* 01/06/2014   HCT 34.7* 01/06/2014   MCV 80.7 01/06/2014   PLT 229 01/06/2014   NEUTROABS 2.4 01/06/2014    Imaging:  No results found.  Medications: I have reviewed the patient's current medications.  Assessment/Plan: 1. Chronic myelogenous leukemia, diagnosed in January of 2009. He remains in hematologic remission. He is taking Gleevec at a dose of 200 mg daily. The peripheral blood PCR was markedly increased in May 2013, likely reflecting medical noncompliance. The peripheral blood PCR was significantly improved on 11/07/2011. The peripheral blood PCR was further improved on 01/31/2012. The peripheral blood PCR was increased on 04/27/2012; stable on 11/12/2012, improved on 02/12/2013; increased on 04/29/2013; increased on 07/10/2013; improved on 09/16/2013; improved on 10/28/2013. 2. 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 3. Status post C3-C4, C4-C5, anterior cervical diskectomy and fusion with allograft and plating 09/30/2010. 4. 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 5. Depression, maintained on Lexapro. 6. Diarrhea, ? related to chronic pancreatitis versus Gleevec. He takes Lomotil as needed.  7. Indurated facial skin lesion 11/20/2007 with a history of MRSA skin infection of the submental area in May 2008. The induration resolved with doxycycline. 8. Left knee arthroscopy 05/25/2007. 9. Postoperative left knee effusion/pain, likely related to gout. 10. History of gout. No longer taking allopurinol and colchicine 11. Chronic pancreatitis. 12. Status post right above-the-knee amputation. 13. MRSA infection of the submental area May 2008. 14. History of tobacco, alcohol, and cocaine use. 15. History of coronary artery disease. 16. "Shotty" lymphadenopathy of the neck, axilla, and left groin in 2009.  17. History of a microcytic anemia.  18. History of a right olecranon bursa lesion, ? gouty tophus. 19. 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 He previously took AndroGel. 20. History of anemia secondary to chronic disease and Gleevec therapy.  21. Anorexia-potentially related to [place]. He reports Medicaid would not pay for Megace. He has gained weight 22. Chronic left knee and left foot pain. He takes hydrocodone as needed 23. Status post removal of a left knee screw 07/03/2012. 24. Hypokalemia. Likely related to diarrhea. He is not taking potassium on a regular schedule. 25. Status post left foot surgery. 26. Pulsatile fullness right neck.   Disposition: [name] will continue Gleevec. We will obtain a peripheral blood PCR when he returns in 6 weeks.   He continues to have hypokalemia. He will try to take the potassium as prescribed.   We will call Lomotil and Compazine to his pharmacy. He will contact his PCP regarding  refills on his other medications.  The etiology of the pulsatile fullness at the right low neck is unclear. We are referring him for an  ultrasound, question thyroid enlargement, question mass, question aneurysm.   He will return for a follow-up visit in 6 weeks.   Patient seen with Dr. [name].     [name] ANP/GNP-BC   01/06/2014  11:49 AM   This was a shared visit with [name]. [name] was interviewed and examined. We will refer him for an ultrasound to evaluate the pulsatile fullness in the right neck.  [name], M.D.

## 2014-01-07 ENCOUNTER — Telehealth: Payer: Self-pay | Admitting: Oncology

## 2014-01-07 NOTE — Telephone Encounter (Signed)
Per 08/31 POF, labs/ov GBS @11 :45 time already taken put pt in at 10:15 w/GBS, mailed sch to pt...KJ

## 2014-01-08 ENCOUNTER — Ambulatory Visit (HOSPITAL_COMMUNITY)
Admission: RE | Admit: 2014-01-08 | Discharge: 2014-01-08 | Disposition: A | Discharge status: Home / Self Care | Payer: Medicare Other | Source: Ambulatory Visit | Attending: Nurse Practitioner | Admitting: Nurse Practitioner

## 2014-01-08 DIAGNOSIS — C9210 Chronic myeloid leukemia, BCR/ABL-positive, not having achieved remission: Secondary | ICD-10-CM | POA: Insufficient documentation

## 2014-01-10 ENCOUNTER — Other Ambulatory Visit: Payer: Self-pay | Admitting: Nurse Practitioner

## 2014-01-20 ENCOUNTER — Telehealth: Payer: Self-pay | Admitting: Dietician

## 2014-01-20 NOTE — Telephone Encounter (Signed)
Brief Outpatient Oncology Nutrition Note  Patient has been identified to be at risk on malnutrition screen.  Wt Readings from Last 10 Encounters:  01/06/14 155 lb (70.308 kg)  10/28/13 162 lb 6.4 oz (73.664 kg)  09/16/13 162 lb 8 oz (73.71 kg)  07/09/13 159 lb 4.8 oz (72.258 kg)  04/29/13 159 lb 6.4 oz (72.303 kg)  02/12/13 159 lb 12.8 oz (72.485 kg)  11/12/12 156 lb 3.2 oz (70.852 kg)  08/07/12 154 lb 4.8 oz (69.99 kg)  07/03/12 155 lb (70.308 kg)  07/03/12 155 lb (70.308 kg)    Dx:  CML.  Patient of Dr. [name].  Called patient due to weight loss.  Patient states that he has no appetite and is not eating for the past 2 weeks.  States that diarrhea has continued.  States that he has tried supplements (boost and ensure) and does not like them.  Discussed tips to help with diarrhea.  Patient is lactose intolerant and has continued to eat ice cream.  Advised to follow a low lactose diet.  Discussed foods that are easier to tolerate with diarrhea.  Patient declined an appointment with the [place] RD at this time.  Will mail handouts on diarrhea and diet, dehydration, and lactose free tips along with contact information for the [place] RD.  Antonieta Iba, RD, LDN

## 2014-01-22 ENCOUNTER — Telehealth: Payer: Self-pay | Admitting: *Deleted

## 2014-01-22 ENCOUNTER — Other Ambulatory Visit: Payer: Self-pay | Admitting: Nurse Practitioner

## 2014-01-22 DIAGNOSIS — C9210 Chronic myeloid leukemia, BCR/ABL-positive, not having achieved remission: Secondary | ICD-10-CM

## 2014-01-22 NOTE — Telephone Encounter (Signed)
Called and informed patient that ultrasound showed possible aneurysm right carotid and a referral was made to vascular surgery.  Informed patient that vascular surgery should be calling and to call back with any other questions or concerns. Per Elby Showers. Thomas.  Patient verbalized understanding.

## 2014-01-22 NOTE — Telephone Encounter (Signed)
Message copied by Wardell Heath on Wed Jan 22, 2014 10:00 AM ------      Message from: [place], Lattie Haw K      Created: Wed Jan 22, 2014  9:10 AM       Please let patient know ultrasound showed possible aneurysm right carotid and we made a referral to vascular surgery. Thanks.  ------

## 2014-01-23 ENCOUNTER — Telehealth: Payer: Self-pay | Admitting: Oncology

## 2014-01-23 NOTE — Telephone Encounter (Signed)
[name] [facility] called to verify referral and stated would call and make appt w/pt-ststaed referral was put in incorrectly.

## 2014-01-29 ENCOUNTER — Other Ambulatory Visit: Payer: Self-pay | Admitting: *Deleted

## 2014-01-29 ENCOUNTER — Encounter: Payer: Self-pay | Admitting: Vascular Surgery

## 2014-01-29 DIAGNOSIS — I720 Aneurysm of carotid artery: Secondary | ICD-10-CM

## 2014-01-30 ENCOUNTER — Encounter: Payer: Self-pay | Admitting: Vascular Surgery

## 2014-01-31 ENCOUNTER — Ambulatory Visit (HOSPITAL_COMMUNITY)
Admission: RE | Admit: 2014-01-31 | Discharge: 2014-01-31 | Disposition: A | Discharge status: Home / Self Care | Payer: Medicare Other | Source: Ambulatory Visit | Attending: Vascular Surgery | Admitting: Vascular Surgery

## 2014-01-31 ENCOUNTER — Encounter: Payer: Self-pay | Admitting: Vascular Surgery

## 2014-01-31 ENCOUNTER — Ambulatory Visit (INDEPENDENT_AMBULATORY_CARE_PROVIDER_SITE_OTHER): Payer: Medicare Other | Admitting: Vascular Surgery

## 2014-01-31 VITALS — BP 183/96 | HR 76 | Temp 97.6°F | Resp 16 | Ht 69.0 in | Wt 147.0 lb

## 2014-01-31 DIAGNOSIS — R221 Localized swelling, mass and lump, neck: Secondary | ICD-10-CM | POA: Diagnosis not present

## 2014-01-31 DIAGNOSIS — I720 Aneurysm of carotid artery: Secondary | ICD-10-CM | POA: Insufficient documentation

## 2014-01-31 DIAGNOSIS — I6529 Occlusion and stenosis of unspecified carotid artery: Secondary | ICD-10-CM | POA: Insufficient documentation

## 2014-01-31 DIAGNOSIS — R220 Localized swelling, mass and lump, head: Secondary | ICD-10-CM | POA: Diagnosis not present

## 2014-01-31 NOTE — Assessment & Plan Note (Signed)
The patient could either have ectatic arteries or potentially a aneurysm of the right common carotid artery. If he simply has ectatic arteries I do not think any further workup or intervention would be needed. However, if he has a true aneurysm, he would be at increased risk for stroke in this could potentially need to be addressed. For this reason I have recommended CT angiogram the neck to further evaluate his carotids. In addition he has a mass adjacent to his lower lip on the left and a CT scan would also allow [place] to evaluate any soft tissue masses. We will try to make an appointment to see an ENT physician. I'll see him back after his CT angiogram and will make further recommendations pending these results.

## 2014-01-31 NOTE — Addendum Note (Signed)
Addended by: Mena Goes on: 01/31/2014 05:20 PM   Modules accepted: Orders
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
## 2014-02-17 ENCOUNTER — Ambulatory Visit: Payer: Medicare Other | Admitting: Oncology

## 2014-02-17 ENCOUNTER — Other Ambulatory Visit: Payer: Self-pay | Admitting: *Deleted

## 2014-02-17 ENCOUNTER — Telehealth: Payer: Self-pay | Admitting: Oncology

## 2014-02-17 ENCOUNTER — Ambulatory Visit (HOSPITAL_BASED_OUTPATIENT_CLINIC_OR_DEPARTMENT_OTHER): Payer: Medicare Other | Admitting: Oncology

## 2014-02-17 ENCOUNTER — Other Ambulatory Visit (HOSPITAL_BASED_OUTPATIENT_CLINIC_OR_DEPARTMENT_OTHER): Payer: Medicare Other

## 2014-02-17 ENCOUNTER — Other Ambulatory Visit: Payer: Medicare Other

## 2014-02-17 VITALS — BP 153/92 | HR 80 | Temp 98.4°F | Resp 18 | Ht 69.0 in | Wt 149.7 lb

## 2014-02-17 DIAGNOSIS — C9210 Chronic myeloid leukemia, BCR/ABL-positive, not having achieved remission: Secondary | ICD-10-CM

## 2014-02-17 DIAGNOSIS — C9211 Chronic myeloid leukemia, BCR/ABL-positive, in remission: Secondary | ICD-10-CM

## 2014-02-17 DIAGNOSIS — I6529 Occlusion and stenosis of unspecified carotid artery: Secondary | ICD-10-CM

## 2014-02-17 DIAGNOSIS — Z23 Encounter for immunization: Secondary | ICD-10-CM

## 2014-02-17 LAB — CBC WITH DIFFERENTIAL/PLATELET
BASO%: 0.9 % (ref 0.0–2.0)
BASOS ABS: 0.1 10*3/uL (ref 0.0–0.1)
EOS%: 6.1 % (ref 0.0–7.0)
Eosinophils Absolute: 0.4 10*3/uL (ref 0.0–0.5)
HCT: 39.3 % (ref 38.4–49.9)
HGB: 12.3 g/dL — ABNORMAL LOW (ref 13.0–17.1)
LYMPH%: 19 % (ref 14.0–49.0)
MCH: 25 pg — ABNORMAL LOW (ref 27.2–33.4)
MCHC: 31.2 g/dL — AB (ref 32.0–36.0)
MCV: 80.2 fL (ref 79.3–98.0)
MONO#: 0.5 10*3/uL (ref 0.1–0.9)
MONO%: 8.6 % (ref 0.0–14.0)
NEUT#: 4.1 10*3/uL (ref 1.5–6.5)
NEUT%: 65.4 % (ref 39.0–75.0)
PLATELETS: 281 10*3/uL (ref 140–400)
RBC: 4.9 10*6/uL (ref 4.20–5.82)
RDW: 16.5 % — ABNORMAL HIGH (ref 11.0–14.6)
WBC: 6.2 10*3/uL (ref 4.0–10.3)
lymph#: 1.2 10*3/uL (ref 0.9–3.3)

## 2014-02-17 LAB — COMPREHENSIVE METABOLIC PANEL (CC13)
ALT: 17 U/L (ref 0–55)
ANION GAP: 13 meq/L — AB (ref 3–11)
AST: 17 U/L (ref 5–34)
Albumin: 3.4 g/dL — ABNORMAL LOW (ref 3.5–5.0)
Alkaline Phosphatase: 84 U/L (ref 40–150)
BILIRUBIN TOTAL: 0.38 mg/dL (ref 0.20–1.20)
BUN: 16.7 mg/dL (ref 7.0–26.0)
CO2: 26 meq/L (ref 22–29)
Calcium: 9.7 mg/dL (ref 8.4–10.4)
Chloride: 106 mEq/L (ref 98–109)
Creatinine: 1.1 mg/dL (ref 0.7–1.3)
Glucose: 111 mg/dl (ref 70–140)
Potassium: 3.6 mEq/L (ref 3.5–5.1)
Sodium: 144 mEq/L (ref 136–145)
Total Protein: 6.5 g/dL (ref 6.4–8.3)

## 2014-02-17 MED ORDER — INFLUENZA VAC SPLIT QUAD 0.5 ML IM SUSY
0.5000 mL | PREFILLED_SYRINGE | Freq: Once | INTRAMUSCULAR | Status: AC
  Administered 2014-02-17: 0.5 mL via INTRAMUSCULAR
  Filled 2014-02-17: qty 0.5

## 2014-02-17 MED ORDER — DIPHENOXYLATE-ATROPINE 2.5-0.025 MG PO TABS: 1.0000 | ORAL_TABLET | Freq: Four times a day (QID) | ORAL | Status: DC | PRN

## 2014-02-17 NOTE — Progress Notes (Signed)
[name]   Diagnosis:  CML  INTERVAL HISTORY:   [name] returns as scheduled. He has seen Dr. Scot Dock for evaluation of the possible neck aneurysm. He is scheduled for a CT angiogram. He continues [place]. He reports missing approximately 4 doses per month. He continues to have intermittent nausea. A poor appetite and energy level. Intermittent abdominal pain.  He underwent removal of a foot lesion by orthopedics and a cyst at the left base by Dr. Lucia Gaskins.  Objective:  Vital signs in last 24 hours:  Blood pressure 153/92, pulse 80, temperature 98.4 F (36.9 C), temperature source Oral, resp. rate 18, height 5\' 9"  (1.753 m), weight 149 lb 11.2 oz (67.903 kg), SpO2 100.00%.    HEENT: No thrush or ulcer Lymphatics: No cervical, supraclavicular, axillary, or inguinal nodes Resp: Lungs clear bilaterally Cardio: Regular rate and rhythm GI: No hepatosplenomegaly, no mass Vascular: No left leg edema   Lab Results:  Lab Results  Component Value Date   WBC 6.2 02/17/2014   HGB 12.3* 02/17/2014   HCT 39.3 02/17/2014   MCV 80.2 02/17/2014   PLT 281 02/17/2014   NEUTROABS 4.1 02/17/2014    Medications: I have reviewed the patient's current medications.  Assessment/Plan: 1. Chronic myelogenous leukemia, diagnosed in January of 2009. He remains in hematologic remission. He is taking Gleevec at a dose of 200 mg daily. The peripheral blood PCR was markedly increased in May 2013, likely reflecting medical noncompliance. The peripheral blood PCR was significantly improved on 11/07/2011. The peripheral blood PCR was further improved on 01/31/2012. The peripheral blood PCR was increased on 04/27/2012; stable on 11/12/2012, improved on 02/12/2013; increased on 04/29/2013; increased on 07/10/2013; improved on 09/16/2013; improved on 10/28/2013. 2. 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 3. Status post C3-C4, C4-C5, anterior cervical diskectomy  and fusion with allograft and plating 09/30/2010. 4. 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 5. Depression, maintained on Lexapro. 6. Diarrhea, ? related to chronic pancreatitis versus Gleevec. He takes Lomotil as needed.  7. Indurated facial skin lesion 11/20/2007 with a history of MRSA skin infection of the submental area in May 2008. The induration resolved with doxycycline. 8. Left knee arthroscopy 05/25/2007. 9. Postoperative left knee effusion/pain, likely related to gout. 10. History of gout. No longer taking allopurinol and colchicine 11. Chronic pancreatitis. 12. Status post right above-the-knee amputation. 13. MRSA infection of the submental area May 2008. 14. History of tobacco, alcohol, and cocaine use. 15. History of coronary artery disease. 16. "Shotty" lymphadenopathy of the neck, axilla, and left groin in 2009.  17. History of a microcytic anemia.  18. History of a right olecranon bursa lesion, ? gouty tophus. 19. 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 He previously took AndroGel. 20. History of anemia secondary to chronic disease and Gleevec therapy.  21. Anorexia-potentially related to [place]. He reports Medicaid would not pay for Megace. He has gained weight 22. Chronic left knee and left foot pain. He takes hydrocodone as needed 23. Status post removal of a left knee screw 07/03/2012. 24. History of Hypokalemia. Likely related to diarrhea. 25. Status post left foot surgery. 26. Pulsatile fullness right neck. Evaluated by vascular surgery and scheduled for a CT angiogram      Disposition: [name] remains in hematologic remission from the [facility]. We will followup on the peripheral blood PCR from today. Mr. Scarce will return for an office and lab visit in one  month. He will followup with vascular surgery for evaluation of the neck fullness. We will check a testosterone level when he returns next  month.   Barnes Florek ANP/GNP-BC   02/17/2014  2:23 PM

## 2014-02-17 NOTE — Telephone Encounter (Signed)
Pt confirmed labs/ov per 10/12 POF, gave pt AVS.... KJ °

## 2014-02-28 ENCOUNTER — Telehealth: Payer: Self-pay | Admitting: *Deleted

## 2014-02-28 ENCOUNTER — Other Ambulatory Visit: Payer: Self-pay | Admitting: *Deleted

## 2014-02-28 DIAGNOSIS — C9210 Chronic myeloid leukemia, BCR/ABL-positive, not having achieved remission: Secondary | ICD-10-CM

## 2014-02-28 NOTE — Telephone Encounter (Signed)
Per Dr. [name]; left message for pt to call office re: confirming if he is taking his [place] consistently.

## 2014-03-04 ENCOUNTER — Encounter: Payer: Self-pay | Admitting: Vascular Surgery

## 2014-03-05 ENCOUNTER — Ambulatory Visit
Admission: RE | Admit: 2014-03-05 | Discharge: 2014-03-05 | Disposition: A | Discharge status: Home / Self Care | Payer: Medicare Other | Source: Ambulatory Visit | Attending: Vascular Surgery | Admitting: Vascular Surgery

## 2014-03-05 ENCOUNTER — Other Ambulatory Visit: Payer: Self-pay | Admitting: *Deleted

## 2014-03-05 ENCOUNTER — Telehealth: Payer: Self-pay | Admitting: Vascular Surgery

## 2014-03-05 ENCOUNTER — Ambulatory Visit: Payer: Medicare Other | Admitting: Vascular Surgery

## 2014-03-05 DIAGNOSIS — I6523 Occlusion and stenosis of bilateral carotid arteries: Secondary | ICD-10-CM

## 2014-03-05 DIAGNOSIS — I720 Aneurysm of carotid artery: Secondary | ICD-10-CM

## 2014-03-05 DIAGNOSIS — Z01810 Encounter for preprocedural cardiovascular examination: Secondary | ICD-10-CM

## 2014-03-05 NOTE — Telephone Encounter (Signed)
Spoke with pt. Need to r/s CTA due to allergic reaction. Gave patient new date and time for CTA and CSD visit. Gave all info to pt. Verbalized understanding.

## 2014-03-07 ENCOUNTER — Ambulatory Visit
Admission: RE | Admit: 2014-03-07 | Discharge: 2014-03-07 | Disposition: A | Discharge status: Home / Self Care | Payer: Medicare Other | Source: Ambulatory Visit | Attending: Vascular Surgery | Admitting: Vascular Surgery

## 2014-03-07 DIAGNOSIS — I6523 Occlusion and stenosis of bilateral carotid arteries: Secondary | ICD-10-CM

## 2014-03-07 DIAGNOSIS — Z01810 Encounter for preprocedural cardiovascular examination: Secondary | ICD-10-CM

## 2014-03-07 MED ORDER — IOHEXOL 350 MG/ML SOLN
65.0000 mL | Freq: Once | INTRAVENOUS | Status: AC | PRN
  Administered 2014-03-07: 65 mL via INTRAVENOUS

## 2014-03-11 ENCOUNTER — Encounter: Payer: Self-pay | Admitting: Vascular Surgery

## 2014-03-12 ENCOUNTER — Ambulatory Visit (INDEPENDENT_AMBULATORY_CARE_PROVIDER_SITE_OTHER): Payer: Medicare Other | Admitting: Vascular Surgery

## 2014-03-12 ENCOUNTER — Encounter: Payer: Self-pay | Admitting: Vascular Surgery

## 2014-03-12 VITALS — BP 140/86 | HR 68 | Ht 69.0 in | Wt 161.8 lb

## 2014-03-12 DIAGNOSIS — I6529 Occlusion and stenosis of unspecified carotid artery: Secondary | ICD-10-CM

## 2014-03-12 DIAGNOSIS — I720 Aneurysm of carotid artery: Secondary | ICD-10-CM

## 2014-03-12 NOTE — Progress Notes (Signed)
   Patient name: [name] MRN: [id number] DOB: 1945/09/21 Sex: male  REASON FOR VISIT: Follow up of pulsatile right neck mass.  HPI: [name] is a 68 y.o. male who I saw in consultation on 01/31/2014 with a pulsatile right neck mass. Ultrasound had suggested a possible carotid artery aneurysm. At that visit, I reviewed his ultrasound which showed that the right carotid bulb was ectatic or possibly aneurysmal. I recommended a CT angiogram to further evaluate this. My feeling was that if he clearly had an aneurysm this could potentially need to be addressed because of the risk of stroke. On the other hand if the artery was simply ectatic this would not be an issue. In addition, he was noted to have a mass adjacent to his lower lip on the left and I thought a CT may be helpful here. In addition we made arrangements for him to see ENT.  Dense I saw him last he had no new symptoms. He denies focal weakness or paresthesias.  REVIEW OF SYSTEMS: Valu.Nieves ] denotes positive finding; [  ] denotes negative finding  CARDIOVASCULAR:  [ ]  chest pain   [ ]  dyspnea on exertion    CONSTITUTIONAL:  [ ]  fever   [ ]  chills  PHYSICAL EXAM: Filed Vitals:   03/12/14 1333  BP: 140/86  Pulse: 68  Height: 5\' 9"  (1.753 m)  Weight: 161 lb 12.8 oz (73.392 kg)  SpO2: 99%   Body mass index is 23.88 kg/(m^2). GENERAL: The patient is a well-nourished male, in no acute distress. The vital signs are documented above. CARDIOVASCULAR: There is a regular rate and rhythm. PULMONARY: There is good air exchange bilaterally without wheezing or rales.  CT ANGIOGRAM: I reviewed his CT angio. This shows that the right internal carotid artery is tortuous but no aneurysm is identified. He had evidence of 25% stenosis in the internal carotid arteries bilaterally.  MEDICAL ISSUES: I reassured him that he has no evidence of a carotid artery aneurysm. His arteries are simply tortuous. He has no significant carotid stenosis.  With  respect to the mass on his left lip he was seen by Dr. Lucia Gaskins and this has been addressed. He apparently did an I&D.  I will see him back as needed.  [place] [facility] [place]: [phone]

## 2014-03-13 ENCOUNTER — Other Ambulatory Visit: Payer: Self-pay | Admitting: Nurse Practitioner

## 2014-03-13 DIAGNOSIS — C9290 Myeloid leukemia, unspecified, not having achieved remission: Secondary | ICD-10-CM

## 2014-03-14 ENCOUNTER — Other Ambulatory Visit: Payer: Self-pay | Admitting: Nurse Practitioner

## 2014-03-17 ENCOUNTER — Other Ambulatory Visit: Payer: Medicare Other

## 2014-03-17 ENCOUNTER — Ambulatory Visit: Payer: Medicare Other | Admitting: Nurse Practitioner

## 2014-03-20 ENCOUNTER — Telehealth: Payer: Self-pay | Admitting: Oncology

## 2014-03-20 NOTE — Telephone Encounter (Signed)
pt called to r/s missed appt...ok and aware

## 2014-03-21 ENCOUNTER — Ambulatory Visit: Payer: Medicare Other | Admitting: Nurse Practitioner

## 2014-03-21 ENCOUNTER — Other Ambulatory Visit: Payer: Self-pay | Admitting: *Deleted

## 2014-03-21 ENCOUNTER — Telehealth: Payer: Self-pay | Admitting: *Deleted

## 2014-03-21 ENCOUNTER — Other Ambulatory Visit: Payer: Medicare Other

## 2014-03-21 DIAGNOSIS — C9210 Chronic myeloid leukemia, BCR/ABL-positive, not having achieved remission: Secondary | ICD-10-CM

## 2014-03-21 MED ORDER — IMATINIB MESYLATE 100 MG PO TABS: 200.0000 mg | ORAL_TABLET | Freq: Every day | ORAL | Status: DC

## 2014-03-21 NOTE — Telephone Encounter (Signed)
Message from pt to cancel appt today. "My gout is so bad, I'm not going to make it in." Order sent to schedulers to call pt with new appt.

## 2014-03-24 ENCOUNTER — Telehealth: Payer: Self-pay | Admitting: Oncology

## 2014-03-24 NOTE — Telephone Encounter (Signed)
s.w pt and advised on 11.20 appt....pt ok and aware

## 2014-03-28 ENCOUNTER — Telehealth: Payer: Self-pay | Admitting: Nurse Practitioner

## 2014-03-28 ENCOUNTER — Other Ambulatory Visit (HOSPITAL_BASED_OUTPATIENT_CLINIC_OR_DEPARTMENT_OTHER): Payer: Medicare Other

## 2014-03-28 ENCOUNTER — Telehealth: Payer: Self-pay | Admitting: Oncology

## 2014-03-28 ENCOUNTER — Ambulatory Visit (HOSPITAL_BASED_OUTPATIENT_CLINIC_OR_DEPARTMENT_OTHER): Payer: Medicare Other | Admitting: Nurse Practitioner

## 2014-03-28 VITALS — BP 133/78 | HR 86 | Temp 97.7°F | Resp 19 | Ht 69.0 in | Wt 156.3 lb

## 2014-03-28 DIAGNOSIS — Z23 Encounter for immunization: Secondary | ICD-10-CM

## 2014-03-28 DIAGNOSIS — C9210 Chronic myeloid leukemia, BCR/ABL-positive, not having achieved remission: Secondary | ICD-10-CM

## 2014-03-28 LAB — COMPREHENSIVE METABOLIC PANEL (CC13)
ALBUMIN: 3.1 g/dL — AB (ref 3.5–5.0)
ALT: 12 U/L (ref 0–55)
ANION GAP: 12 meq/L — AB (ref 3–11)
AST: 16 U/L (ref 5–34)
Alkaline Phosphatase: 85 U/L (ref 40–150)
BILIRUBIN TOTAL: 0.22 mg/dL (ref 0.20–1.20)
BUN: 16.4 mg/dL (ref 7.0–26.0)
CHLORIDE: 107 meq/L (ref 98–109)
CO2: 24 mEq/L (ref 22–29)
Calcium: 9.3 mg/dL (ref 8.4–10.4)
Creatinine: 1.1 mg/dL (ref 0.7–1.3)
GLUCOSE: 132 mg/dL (ref 70–140)
Potassium: 3.2 mEq/L — ABNORMAL LOW (ref 3.5–5.1)
Sodium: 143 mEq/L (ref 136–145)
TOTAL PROTEIN: 5.9 g/dL — AB (ref 6.4–8.3)

## 2014-03-28 LAB — CBC WITH DIFFERENTIAL/PLATELET
BASO%: 0.4 % (ref 0.0–2.0)
Basophils Absolute: 0 10*3/uL (ref 0.0–0.1)
EOS%: 6.6 % (ref 0.0–7.0)
Eosinophils Absolute: 0.3 10*3/uL (ref 0.0–0.5)
HCT: 33.5 % — ABNORMAL LOW (ref 38.4–49.9)
HGB: 10.7 g/dL — ABNORMAL LOW (ref 13.0–17.1)
LYMPH%: 27.6 % (ref 14.0–49.0)
MCH: 25.5 pg — ABNORMAL LOW (ref 27.2–33.4)
MCHC: 31.9 g/dL — AB (ref 32.0–36.0)
MCV: 79.8 fL (ref 79.3–98.0)
MONO#: 0.4 10*3/uL (ref 0.1–0.9)
MONO%: 9.3 % (ref 0.0–14.0)
NEUT#: 2.5 10*3/uL (ref 1.5–6.5)
NEUT%: 56.1 % (ref 39.0–75.0)
Platelets: 308 10*3/uL (ref 140–400)
RBC: 4.2 10*6/uL (ref 4.20–5.82)
RDW: 17 % — ABNORMAL HIGH (ref 11.0–14.6)
WBC: 4.5 10*3/uL (ref 4.0–10.3)
lymph#: 1.3 10*3/uL (ref 0.9–3.3)

## 2014-03-28 MED ORDER — PNEUMOCOCCAL 13-VAL CONJ VACC IM SUSP
0.5000 mL | Freq: Once | INTRAMUSCULAR | Status: AC
  Administered 2014-03-28: 0.5 mL via INTRAMUSCULAR
  Filled 2014-03-28: qty 0.5

## 2014-03-28 NOTE — Telephone Encounter (Signed)
pt cld to get appt time & date-gave pt time & date

## 2014-03-28 NOTE — Telephone Encounter (Signed)
Pt confirmed labs/ov per 11/20 POF, gave pt AVS..... KJ  °

## 2014-03-28 NOTE — Patient Instructions (Signed)
Pneumococcal Vaccine, Polyvalent suspension for injection  What is this medicine?  PNEUMOCOCCAL VACCINE, POLYVALENT (NEU mo KOK al vak SEEN, pol ee VEY luhnt) is a vaccine to prevent pneumococcus bacteria infection. These bacteria are a major cause of ear infections, 'Strep throat' infections, and serious pneumonia, meningitis, or blood infections worldwide. These vaccines help the body to produce antibodies (protective substances) that help your body defend against these bacteria. This vaccine is recommended for infants and young children. This vaccine will not treat an infection.  This medicine may be used for other purposes; ask your health care provider or pharmacist if you have questions.  COMMON BRAND NAME(S): Prevnar 13  What should I tell my health care provider before I take this medicine?  They need to know if you have any of these conditions:  -bleeding problems  -fever  -immune system problems  -low platelet count in the blood  -seizures  -an unusual or allergic reaction to pneumococcal vaccine, diphtheria toxoid, other vaccines, latex, other medicines, foods, dyes, or preservatives  -pregnant or trying to get pregnant  -breast-feeding  How should I use this medicine?  This vaccine is for injection into a muscle. It is given by a health care professional.  A copy of Vaccine Information Statements will be given before each vaccination. Read this sheet carefully each time. The sheet may change frequently.  Talk to your pediatrician regarding the use of this medicine in children. While this drug may be prescribed for children as young as 6 weeks old for selected conditions, precautions do apply.  Overdosage: If you think you have taken too much of this medicine contact a poison control center or emergency room at once.  NOTE: This medicine is only for you. Do not share this medicine with others.  What if I miss a dose?  It is important not to miss your dose. Call your doctor or health care professional if  you are unable to keep an appointment.  What may interact with this medicine?  -medicines for cancer chemotherapy  -medicines that suppress your immune function  -medicines that treat or prevent blood clots like warfarin, enoxaparin, and dalteparin  -steroid medicines like prednisone or cortisone  This list may not describe all possible interactions. Give your health care provider a list of all the medicines, herbs, non-prescription drugs, or dietary supplements you use. Also tell them if you smoke, drink alcohol, or use illegal drugs. Some items may interact with your medicine.  What should I watch for while using this medicine?  Mild fever and pain should go away in 3 days or less. Report any unusual symptoms to your doctor or health care professional.  What side effects may I notice from receiving this medicine?  Side effects that you should report to your doctor or health care professional as soon as possible:  -allergic reactions like skin rash, itching or hives, swelling of the face, lips, or tongue  -breathing problems  -confused  -fever over 102 degrees F  -pain, tingling, numbness in the hands or feet  -seizures  -unusual bleeding or bruising  -unusual muscle weakness  Side effects that usually do not require medical attention (report to your doctor or health care professional if they continue or are bothersome):  -aches and pains  -diarrhea  -fever of 102 degrees F or less  -headache  -irritable  -loss of appetite  -pain, tender at site where injected  -trouble sleeping  This list may not describe all possible side effects. Call   your doctor for medical advice about side effects. You may report side effects to FDA at 1-800-FDA-1088.  Where should I keep my medicine?  This does not apply. This vaccine is given in a clinic, pharmacy, doctor's office, or other health care setting and will not be stored at home.  NOTE: This sheet is a summary. It may not cover all possible information. If you have questions  about this medicine, talk to your doctor, pharmacist, or health care provider.  © 2015, [employer]vier/[employer]. (2008-07-08 10:17:22)

## 2014-03-28 NOTE — Progress Notes (Signed)
[facility] OFFICE PROGRESS NOTE   Diagnosis:  CML  INTERVAL HISTORY:   [name] returns after missing a recent follow-up visit due to a "gout flare". He is feeling better since beginning a new gallop medication. He continues to have intermittent nausea/vomiting and loose stools. He takes Imodium as needed with good results. He continues Gleevec 200 mg daily. He estimates missing 4 or 5 doses a month.  Objective:  Vital signs in last 24 hours:  Blood pressure 133/78, pulse 86, temperature 97.7 F (36.5 C), temperature source Oral, resp. rate 19, height 5\' 9"  (1.753 m), weight 156 lb 4.8 oz (70.897 kg), SpO2 98 %.    HEENT: no thrush or ulcers. Resp: lungs clear bilaterally. Cardio: regular rate and rhythm. GI: abdomen soft and nontender. No organomegaly. Vascular: no left leg edema.  Lab Results:  Lab Results  Component Value Date   WBC 4.5 03/28/2014   HGB 10.7* 03/28/2014   HCT 33.5* 03/28/2014   MCV 79.8 03/28/2014   PLT 308 03/28/2014   NEUTROABS 2.5 03/28/2014    Imaging:  No results found.  Medications: I have reviewed the patient's current medications.  Assessment/Plan: 1. Chronic myelogenous leukemia, diagnosed in January of 2009. He remains in hematologic remission. He is taking Gleevec at a dose of 200 mg daily. The peripheral blood PCR was markedly increased in May 2013, likely reflecting medical noncompliance. The peripheral blood PCR was significantly improved on 11/07/2011. The peripheral blood PCR was further improved on 01/31/2012. The peripheral blood PCR was increased on 04/27/2012; stable on 11/12/2012, improved on 02/12/2013; increased on 04/29/2013; increased on 07/10/2013; improved on 09/16/2013; improved on 10/28/2013; increased 02/17/2014. 2. 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 3. Status post C3-C4, C4-C5, anterior cervical diskectomy and fusion with allograft and plating 09/30/2010. 4. 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 5. Depression, maintained on Lexapro. 6. Diarrhea, ? related to chronic pancreatitis versus Gleevec. He takes Lomotil as needed.  7. Indurated facial skin lesion 11/20/2007 with a history of MRSA skin infection of the submental area in May 2008. The induration resolved with doxycycline. 8. Left knee arthroscopy 05/25/2007. 9. Postoperative left knee effusion/pain, likely related to gout. 10. History of gout. No longer taking allopurinol and colchicine 11. Chronic pancreatitis. 12. Status post right above-the-knee amputation. 13. MRSA infection of the submental area May 2008. 14. History of tobacco, alcohol, and cocaine use. 15. History of coronary artery disease. 16. "Shotty" lymphadenopathy of the neck, axilla, and left groin in 2009.  17. History of a microcytic anemia.  18. History of a right olecranon bursa lesion, ? gouty tophus. 19. 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 He previously took AndroGel. 20. History of anemia secondary to chronic disease and Gleevec therapy.  21. Anorexia-potentially related to [place]. He reports Medicaid would not pay for Megace. He has gained weight 22. Chronic left knee and left foot pain. He takes hydrocodone as needed 23. Status post removal of a left knee screw 07/03/2012. 24. History of Hypokalemia. Likely related to diarrhea. 25. Status post left foot surgery. 26. Pulsatile fullness right neck. Evaluated by vascular surgery status post CT angiogram 03/07/2014 with no carotid artery aneurysm identified.   Disposition: [name] appears unchanged. He will continue Gleevec. The most recent peripheral blood PCR was increased. We will followup on the result from today. I stressed the importance of good compliance with Gleevec.  We are administering the pneumococcal 13 valent  vaccine today.  He will return for a followup visit in 4 weeks. He will contact the office in the  interim with any problems.    [name] ANP/GNP-BC   03/28/2014  3:40 PM

## 2014-03-29 LAB — TESTOSTERONE: Testosterone: 131 ng/dL — ABNORMAL LOW (ref 300–890)

## 2014-03-31 ENCOUNTER — Telehealth: Payer: Self-pay | Admitting: *Deleted

## 2014-03-31 NOTE — Telephone Encounter (Signed)
Left message for patient to call back regarding lab results.

## 2014-03-31 NOTE — Telephone Encounter (Signed)
-----   Message from Ladell Pier, MD sent at 03/28/2014  5:29 PM EST ----- Please call patient, start kcl 11meq daily

## 2014-04-01 ENCOUNTER — Telehealth: Payer: Self-pay | Admitting: *Deleted

## 2014-04-01 NOTE — Telephone Encounter (Signed)
Attempted to call patient again with lab results. Left message for patient to call back

## 2014-04-01 NOTE — Telephone Encounter (Signed)
-----   Message from Owens Shark, NP sent at 04/01/2014  9:13 AM EST ----- Please let him know potassium is low and find out if he is currently taking potassium.

## 2014-04-02 ENCOUNTER — Telehealth: Payer: Self-pay | Admitting: *Deleted

## 2014-04-02 NOTE — Telephone Encounter (Signed)
Called and informed patient that potassium is low.  Patient stated he has not been taking his potassium. Informed patient to start taking potassium twice daily.  Per Elby Showers. Marcello Moores, NP.  Patient verbalized understanding.

## 2014-04-02 NOTE — Telephone Encounter (Signed)
-----   Message from Bruce Pier, MD sent at 03/28/2014  5:29 PM EST ----- Please call patient, start kcl 28meq daily

## 2014-04-04 ENCOUNTER — Other Ambulatory Visit: Payer: Self-pay | Admitting: *Deleted

## 2014-04-25 ENCOUNTER — Ambulatory Visit: Payer: Medicare Other | Admitting: Nurse Practitioner

## 2014-04-25 ENCOUNTER — Other Ambulatory Visit: Payer: Medicare Other

## 2014-04-25 ENCOUNTER — Telehealth: Payer: Self-pay | Admitting: Nurse Practitioner

## 2014-04-25 NOTE — Telephone Encounter (Signed)
LM to confirm r/s appt from 04/25/14 to 04/29/14

## 2014-04-29 ENCOUNTER — Ambulatory Visit (HOSPITAL_BASED_OUTPATIENT_CLINIC_OR_DEPARTMENT_OTHER): Payer: Medicare Other | Admitting: Lab

## 2014-04-29 ENCOUNTER — Telehealth: Payer: Self-pay | Admitting: Oncology

## 2014-04-29 ENCOUNTER — Ambulatory Visit (HOSPITAL_BASED_OUTPATIENT_CLINIC_OR_DEPARTMENT_OTHER): Payer: Medicare Other | Admitting: Nurse Practitioner

## 2014-04-29 ENCOUNTER — Emergency Department (HOSPITAL_COMMUNITY)
Admission: EM | Admit: 2014-04-29 | Discharge: 2014-04-29 | Disposition: A | Discharge status: Home / Self Care | Payer: Medicare Other | Attending: Emergency Medicine | Admitting: Emergency Medicine

## 2014-04-29 ENCOUNTER — Other Ambulatory Visit: Payer: Self-pay

## 2014-04-29 ENCOUNTER — Encounter (HOSPITAL_COMMUNITY): Payer: Self-pay | Admitting: *Deleted

## 2014-04-29 VITALS — BP 169/118 | HR 97 | Temp 98.3°F | Resp 18 | Ht 69.0 in | Wt 162.5 lb

## 2014-04-29 DIAGNOSIS — Z9889 Other specified postprocedural states: Secondary | ICD-10-CM | POA: Diagnosis not present

## 2014-04-29 DIAGNOSIS — Z7902 Long term (current) use of antithrombotics/antiplatelets: Secondary | ICD-10-CM | POA: Insufficient documentation

## 2014-04-29 DIAGNOSIS — I10 Essential (primary) hypertension: Secondary | ICD-10-CM | POA: Diagnosis present

## 2014-04-29 DIAGNOSIS — M1990 Unspecified osteoarthritis, unspecified site: Secondary | ICD-10-CM | POA: Diagnosis not present

## 2014-04-29 DIAGNOSIS — Z87891 Personal history of nicotine dependence: Secondary | ICD-10-CM | POA: Insufficient documentation

## 2014-04-29 DIAGNOSIS — Z79899 Other long term (current) drug therapy: Secondary | ICD-10-CM | POA: Diagnosis not present

## 2014-04-29 DIAGNOSIS — R030 Elevated blood-pressure reading, without diagnosis of hypertension: Secondary | ICD-10-CM

## 2014-04-29 DIAGNOSIS — Z856 Personal history of leukemia: Secondary | ICD-10-CM | POA: Diagnosis not present

## 2014-04-29 DIAGNOSIS — I160 Hypertensive urgency: Secondary | ICD-10-CM

## 2014-04-29 DIAGNOSIS — G8921 Chronic pain due to trauma: Secondary | ICD-10-CM | POA: Diagnosis not present

## 2014-04-29 DIAGNOSIS — Z87828 Personal history of other (healed) physical injury and trauma: Secondary | ICD-10-CM | POA: Diagnosis not present

## 2014-04-29 DIAGNOSIS — M542 Cervicalgia: Secondary | ICD-10-CM

## 2014-04-29 DIAGNOSIS — Z862 Personal history of diseases of the blood and blood-forming organs and certain disorders involving the immune mechanism: Secondary | ICD-10-CM | POA: Diagnosis not present

## 2014-04-29 DIAGNOSIS — C9210 Chronic myeloid leukemia, BCR/ABL-positive, not having achieved remission: Secondary | ICD-10-CM

## 2014-04-29 DIAGNOSIS — Z8719 Personal history of other diseases of the digestive system: Secondary | ICD-10-CM | POA: Diagnosis not present

## 2014-04-29 DIAGNOSIS — I252 Old myocardial infarction: Secondary | ICD-10-CM | POA: Insufficient documentation

## 2014-04-29 LAB — URINALYSIS, ROUTINE W REFLEX MICROSCOPIC
Bilirubin Urine: NEGATIVE
Glucose, UA: NEGATIVE mg/dL
Hgb urine dipstick: NEGATIVE
Ketones, ur: NEGATIVE mg/dL
Leukocytes, UA: NEGATIVE
Nitrite: NEGATIVE
Specific Gravity, Urine: 1.017 (ref 1.005–1.030)
UROBILINOGEN UA: 0.2 mg/dL (ref 0.0–1.0)
pH: 6 (ref 5.0–8.0)

## 2014-04-29 LAB — COMPREHENSIVE METABOLIC PANEL
ALK PHOS: 87 U/L (ref 39–117)
ALT: 11 U/L (ref 0–53)
AST: 22 U/L (ref 0–37)
Albumin: 3.8 g/dL (ref 3.5–5.2)
Anion gap: 9 (ref 5–15)
BILIRUBIN TOTAL: 0.8 mg/dL (ref 0.3–1.2)
BUN: 14 mg/dL (ref 6–23)
CHLORIDE: 104 meq/L (ref 96–112)
CO2: 27 mmol/L (ref 19–32)
Calcium: 9 mg/dL (ref 8.4–10.5)
Creatinine, Ser: 1.1 mg/dL (ref 0.50–1.35)
GFR calc Af Amer: 78 mL/min — ABNORMAL LOW (ref >90)
GFR calc non Af Amer: 67 mL/min — ABNORMAL LOW (ref >90)
Glucose, Bld: 125 mg/dL — ABNORMAL HIGH (ref 70–99)
Potassium: 3.4 mmol/L — ABNORMAL LOW (ref 3.5–5.1)
SODIUM: 140 mmol/L (ref 135–145)
Total Protein: 6.4 g/dL (ref 6.0–8.3)

## 2014-04-29 LAB — CBC WITH DIFFERENTIAL/PLATELET
BASO%: 0.7 % (ref 0.0–2.0)
BASOS PCT: 0 % (ref 0–1)
Basophils Absolute: 0 10*3/uL (ref 0.0–0.1)
Basophils Absolute: 0 10*3/uL (ref 0.0–0.1)
EOS ABS: 0.4 10*3/uL (ref 0.0–0.7)
EOS%: 6.1 % (ref 0.0–7.0)
Eosinophils Absolute: 0.4 10*3/uL (ref 0.0–0.5)
Eosinophils Relative: 6 % — ABNORMAL HIGH (ref 0–5)
HCT: 35 % — ABNORMAL LOW (ref 39.0–52.0)
HEMATOCRIT: 36.7 % — AB (ref 38.4–49.9)
HEMOGLOBIN: 10.7 g/dL — AB (ref 13.0–17.0)
HGB: 11.3 g/dL — ABNORMAL LOW (ref 13.0–17.1)
LYMPH%: 16.7 % (ref 14.0–49.0)
Lymphocytes Relative: 16 % (ref 12–46)
Lymphs Abs: 0.9 10*3/uL (ref 0.7–4.0)
MCH: 24.9 pg — AB (ref 27.2–33.4)
MCH: 24.9 pg — AB (ref 26.0–34.0)
MCHC: 30.8 g/dL — ABNORMAL LOW (ref 32.0–36.0)
MCHC: 30.6 g/dL (ref 30.0–36.0)
MCV: 81 fL (ref 79.3–98.0)
MCV: 81.4 fL (ref 78.0–100.0)
MONO#: 0.7 10*3/uL (ref 0.1–0.9)
MONO%: 9.8 % (ref 0.0–14.0)
MONOS PCT: 10 % (ref 3–12)
Monocytes Absolute: 0.6 10*3/uL (ref 0.1–1.0)
NEUT#: 4.7 10*3/uL (ref 1.5–6.5)
NEUT%: 66.7 % (ref 39.0–75.0)
NEUTROS PCT: 68 % (ref 43–77)
Neutro Abs: 3.9 10*3/uL (ref 1.7–7.7)
PLATELETS: 203 10*3/uL (ref 150–400)
Platelets: 211 10*3/uL (ref 140–400)
RBC: 4.52 10*6/uL (ref 4.20–5.82)
RBC: 4.3 MIL/uL (ref 4.22–5.81)
RDW: 17.5 % — AB (ref 11.0–14.6)
RDW: 16.7 % — ABNORMAL HIGH (ref 11.5–15.5)
WBC: 7.1 10*3/uL (ref 4.0–10.3)
WBC: 5.8 10*3/uL (ref 4.0–10.5)
lymph#: 1.2 10*3/uL (ref 0.9–3.3)

## 2014-04-29 LAB — COMPREHENSIVE METABOLIC PANEL (CC13)
ALK PHOS: 94 U/L (ref 40–150)
ALT: 12 U/L (ref 0–55)
AST: 17 U/L (ref 5–34)
Albumin: 3.4 g/dL — ABNORMAL LOW (ref 3.5–5.0)
Anion Gap: 9 mEq/L (ref 3–11)
BUN: 11.4 mg/dL (ref 7.0–26.0)
CO2: 30 mEq/L — ABNORMAL HIGH (ref 22–29)
Calcium: 9.2 mg/dL (ref 8.4–10.4)
Chloride: 104 mEq/L (ref 98–109)
Creatinine: 1.1 mg/dL (ref 0.7–1.3)
EGFR: 79 mL/min/{1.73_m2} — ABNORMAL LOW (ref >90)
Glucose: 91 mg/dl (ref 70–140)
Potassium: 3.7 mEq/L (ref 3.5–5.1)
Sodium: 143 mEq/L (ref 136–145)
Total Bilirubin: 0.4 mg/dL (ref 0.20–1.20)
Total Protein: 6.3 g/dL — ABNORMAL LOW (ref 6.4–8.3)

## 2014-04-29 LAB — URINE MICROSCOPIC-ADD ON: URINE-OTHER: NONE SEEN

## 2014-04-29 MED ORDER — HYDRALAZINE HCL 20 MG/ML IJ SOLN
10.0000 mg | INTRAMUSCULAR | Status: DC
  Filled 2014-04-29: qty 1

## 2014-04-29 MED ORDER — LOSARTAN POTASSIUM 50 MG PO TABS
100.0000 mg | ORAL_TABLET | Freq: Once | ORAL | Status: AC
  Administered 2014-04-29: 100 mg via ORAL
  Filled 2014-04-29: qty 2

## 2014-04-29 MED ORDER — HYDROCHLOROTHIAZIDE 12.5 MG PO CAPS
25.0000 mg | ORAL_CAPSULE | Freq: Once | ORAL | Status: AC
  Administered 2014-04-29: 25 mg via ORAL
  Filled 2014-04-29: qty 2

## 2014-04-29 MED ORDER — HYDROCODONE-ACETAMINOPHEN 5-325 MG PO TABS: 1.0000 | ORAL_TABLET | Freq: Four times a day (QID) | ORAL | Status: DC | PRN

## 2014-04-29 MED ORDER — KETOROLAC TROMETHAMINE 30 MG/ML IJ SOLN
30.0000 mg | Freq: Once | INTRAMUSCULAR | Status: DC
  Filled 2014-04-29: qty 1

## 2014-04-29 MED ORDER — AMLODIPINE BESYLATE 10 MG PO TABS: 10.0000 mg | ORAL_TABLET | Freq: Every day | ORAL | Status: DC

## 2014-04-29 MED ORDER — LOSARTAN POTASSIUM-HCTZ 100-25 MG PO TABS: 1.0000 | ORAL_TABLET | Freq: Every day | ORAL | Status: DC

## 2014-04-29 MED ORDER — CLONIDINE HCL 0.1 MG PO TABS
0.1000 mg | ORAL_TABLET | Freq: Once | ORAL | Status: AC
  Administered 2014-04-29: 0.1 mg via ORAL
  Filled 2014-04-29: qty 1

## 2014-04-29 MED ORDER — KETOROLAC TROMETHAMINE 30 MG/ML IJ SOLN
30.0000 mg | Freq: Once | INTRAMUSCULAR | Status: AC
  Administered 2014-04-29: 30 mg via INTRAMUSCULAR

## 2014-04-29 MED ORDER — DIAZEPAM 5 MG PO TABS
5.0000 mg | ORAL_TABLET | Freq: Once | ORAL | Status: AC
  Administered 2014-04-29: 5 mg via ORAL
  Filled 2014-04-29: qty 1

## 2014-04-29 NOTE — ED Notes (Addendum)
Pt states he has had intermittent pain in his neck since last week. Pt does not recall doing anything to cause the pain or when the pain started. Pt also states he needs to refill his blood pressure, which he has not taken for 3 days. Pt states he had appointment at cancer center, who told him to come here.

## 2014-04-29 NOTE — Discharge Instructions (Signed)
As discussed, your evaluation today has been largely reassuring.  But, it is important that you monitor your condition carefully, and do not hesitate to return to the ED if you develop new, or concerning changes in your condition. ? ?Otherwise, please follow-up with your physician for appropriate ongoing care. ? ?

## 2014-04-29 NOTE — Telephone Encounter (Signed)
Lm to confirm appt d/t for Jan. Mailed cal.
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
## 2014-04-29 NOTE — Progress Notes (Signed)
[facility] OFFICE PROGRESS NOTE   Diagnosis:  CML  INTERVAL HISTORY:   [name] returns after missing a recent follow-up visit. He continues [place]. He estimates missing 3 or 4 doses over the past month. Diarrhea is controlled with Lomotil. No nausea or vomiting.  He reports recent onset of right neck pain. He rates the pain a 9 or 10 on the pain scale. The pain radiates to the right anterior chest and shoulder region. He denies arm weakness or numbness. He has difficulty turning his neck left or right due to pain. He reports having similar neck pain over the past few months though much milder.  His blood pressure is significantly elevated on multiple readings. He reports running out of his blood pressure medication several days ago. He is not sure he will be able to have it filled today. He denies any headaches or vision change.  He reports a recent gout flare at the left ankle. He has been unable to obtain his gout medication.  Objective:  Vital signs in last 24 hours:  Blood pressure 169/118, pulse 97, temperature 98.3 F (36.8 C), temperature source Oral, resp. rate 18, height 5\' 9"  (1.753 m), weight 162 lb 8 oz (73.71 kg), SpO2 99 %. repeat blood pressure 191/115    HEENT: No thrush or ulcers. Resp: Lungs clear. Cardio: Regular rate and rhythm. GI: Abdomen soft and nontender. No organomegaly. Vascular: No left leg edema. Neuro: Arm strength is 5 over 5. Musculoskeletal: Neck with decreased range of motion due to pain.     Lab Results:  Lab Results  Component Value Date   WBC 7.1 04/29/2014   HGB 11.3* 04/29/2014   HCT 36.7* 04/29/2014   MCV 81.0 04/29/2014   PLT 211 04/29/2014   NEUTROABS 4.7 04/29/2014    Imaging:  No results found.  Medications: I have reviewed the patient's current medications.  Assessment/Plan:  1. Chronic myelogenous leukemia, diagnosed in January of 2009. He remains in hematologic remission. He is taking Gleevec at a  dose of 200 mg daily. The peripheral blood PCR was markedly increased in May 2013, likely reflecting medical noncompliance. The peripheral blood PCR was significantly improved on 11/07/2011. The peripheral blood PCR was further improved on 01/31/2012. The peripheral blood PCR was increased on 04/27/2012; stable on 11/12/2012, improved on 02/12/2013; increased on 04/29/2013; increased on 07/10/2013; improved on 09/16/2013; improved on 10/28/2013; increased 02/17/2014; slightly improved 03/28/2014. 2. 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 3. Status post C3-C4, C4-C5, anterior cervical diskectomy and fusion with allograft and plating 09/30/2010. 4. 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 5. Depression, maintained on Lexapro. 6. Diarrhea, ? related to chronic pancreatitis versus Gleevec. He takes Lomotil as needed.  7. Indurated facial skin lesion 11/20/2007 with a history of MRSA skin infection of the submental area in May 2008. The induration resolved with doxycycline. 8. Left knee arthroscopy 05/25/2007. 9. Postoperative left knee effusion/pain, likely related to gout. 10. History of gout. No longer taking allopurinol and colchicine 11. Chronic pancreatitis. 12. Status post right above-the-knee amputation. 13. MRSA infection of the submental area May 2008. 14. History of tobacco, alcohol, and cocaine use. 15. History of coronary artery disease. 16. "Shotty" lymphadenopathy of the neck, axilla, and left groin in 2009.  17. History of a microcytic anemia.  18. History of a right olecranon bursa lesion, ? gouty tophus. 19. 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 He previously took AndroGel. 20.  History of anemia secondary to chronic disease and Gleevec therapy.  21. Anorexia-potentially related to [place]. He reports Medicaid would not pay for Megace. He has gained weight 22. Chronic left knee and left foot pain. He takes  hydrocodone as needed 23. Status post removal of a left knee screw 07/03/2012. 24. History of Hypokalemia. Likely related to diarrhea. 25. Status post left foot surgery. 26. Pulsatile fullness right neck. Evaluated by vascular surgery status post CT angiogram 03/07/2014 with no carotid artery aneurysm identified.  Disposition: Mr. Tirella will continue Gleevec. We will follow-up on the peripheral blood PCR from today.  He has several other issues occurring today including a markedly elevated blood pressure and severe right neck pain. We have referred him to the emergency department for evaluation of both.  He will return for a follow-up visit here in 4 weeks.   Plan reviewed with Dr. [name].    [name] ANP/GNP-BC   04/29/2014  2:53 PM

## 2014-05-29 ENCOUNTER — Other Ambulatory Visit: Payer: Self-pay | Admitting: *Deleted

## 2014-05-29 ENCOUNTER — Ambulatory Visit (HOSPITAL_BASED_OUTPATIENT_CLINIC_OR_DEPARTMENT_OTHER): Payer: Medicare Other | Admitting: Oncology

## 2014-05-29 ENCOUNTER — Other Ambulatory Visit (HOSPITAL_BASED_OUTPATIENT_CLINIC_OR_DEPARTMENT_OTHER): Payer: Medicare Other

## 2014-05-29 ENCOUNTER — Encounter: Payer: Self-pay | Admitting: Oncology

## 2014-05-29 VITALS — BP 156/96 | HR 96 | Temp 98.2°F | Resp 19 | Ht 69.0 in | Wt 161.1 lb

## 2014-05-29 DIAGNOSIS — C9210 Chronic myeloid leukemia, BCR/ABL-positive, not having achieved remission: Secondary | ICD-10-CM

## 2014-05-29 DIAGNOSIS — M542 Cervicalgia: Secondary | ICD-10-CM

## 2014-05-29 LAB — COMPREHENSIVE METABOLIC PANEL (CC13)
ALBUMIN: 3.9 g/dL (ref 3.5–5.0)
ALT: 19 U/L (ref 0–55)
AST: 21 U/L (ref 5–34)
Alkaline Phosphatase: 95 U/L (ref 40–150)
Anion Gap: 9 mEq/L (ref 3–11)
BILIRUBIN TOTAL: 0.42 mg/dL (ref 0.20–1.20)
BUN: 12.7 mg/dL (ref 7.0–26.0)
CALCIUM: 9.5 mg/dL (ref 8.4–10.4)
CHLORIDE: 104 meq/L (ref 98–109)
CO2: 27 mEq/L (ref 22–29)
CREATININE: 1.1 mg/dL (ref 0.7–1.3)
EGFR: 77 mL/min/{1.73_m2} — ABNORMAL LOW (ref >90)
Glucose: 99 mg/dl (ref 70–140)
POTASSIUM: 3.5 meq/L (ref 3.5–5.1)
Sodium: 140 mEq/L (ref 136–145)
Total Protein: 6.9 g/dL (ref 6.4–8.3)

## 2014-05-29 LAB — CBC WITH DIFFERENTIAL/PLATELET
BASO%: 0.7 % (ref 0.0–2.0)
Basophils Absolute: 0 10*3/uL (ref 0.0–0.1)
EOS ABS: 0.5 10*3/uL (ref 0.0–0.5)
EOS%: 7.9 % — ABNORMAL HIGH (ref 0.0–7.0)
HEMATOCRIT: 37.3 % — AB (ref 38.4–49.9)
HGB: 11.8 g/dL — ABNORMAL LOW (ref 13.0–17.1)
LYMPH#: 1.5 10*3/uL (ref 0.9–3.3)
LYMPH%: 23.8 % (ref 14.0–49.0)
MCH: 25.4 pg — AB (ref 27.2–33.4)
MCHC: 31.6 g/dL — ABNORMAL LOW (ref 32.0–36.0)
MCV: 80.2 fL (ref 79.3–98.0)
MONO#: 0.5 10*3/uL (ref 0.1–0.9)
MONO%: 7.4 % (ref 0.0–14.0)
NEUT#: 3.7 10*3/uL (ref 1.5–6.5)
NEUT%: 60.2 % (ref 39.0–75.0)
Platelets: 232 10*3/uL (ref 140–400)
RBC: 4.65 10*6/uL (ref 4.20–5.82)
RDW: 16 % — ABNORMAL HIGH (ref 11.0–14.6)
WBC: 6.1 10*3/uL (ref 4.0–10.3)

## 2014-05-29 MED ORDER — DIPHENOXYLATE-ATROPINE 2.5-0.025 MG PO TABS: 1.0000 | ORAL_TABLET | Freq: Four times a day (QID) | ORAL | Status: DC | PRN

## 2014-05-29 MED ORDER — PROCHLORPERAZINE MALEATE 5 MG PO TABS
5.0000 mg | ORAL_TABLET | Freq: Four times a day (QID) | ORAL | Status: DC | PRN
Start: 2014-05-29 — End: 2014-11-12

## 2014-05-29 NOTE — Patient Instructions (Addendum)
Your Lomotil script for #20 pills has been sent to your [facility] on [address] and month supply will be sent to [facility]. Your Compazine script for nausea was sent to [facility] as requested. Please follow up with Dr. Warren Danes for your Plavix, gout medication and your blood pressure. Try to remember your K+ tablet twice daily-your K+ level is low normal
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
## 2014-05-30 ENCOUNTER — Other Ambulatory Visit: Payer: Self-pay | Admitting: Nurse Practitioner

## 2014-05-30 ENCOUNTER — Telehealth: Payer: Self-pay | Admitting: Oncology

## 2014-05-30 NOTE — Telephone Encounter (Signed)
lvm for pt regarding to March appt....mailed pt appt sched/avs and letter °

## 2014-07-04 ENCOUNTER — Encounter (HOSPITAL_COMMUNITY): Payer: Self-pay | Admitting: Emergency Medicine

## 2014-07-04 ENCOUNTER — Emergency Department (HOSPITAL_COMMUNITY): Admission: EM | Admit: 2014-07-04 | Discharge: 2014-07-04 | Discharge status: Absent without leave | Payer: Self-pay

## 2014-07-04 ENCOUNTER — Emergency Department (HOSPITAL_COMMUNITY): Payer: Medicare Other

## 2014-07-04 DIAGNOSIS — I10 Essential (primary) hypertension: Secondary | ICD-10-CM | POA: Diagnosis not present

## 2014-07-04 DIAGNOSIS — K5900 Constipation, unspecified: Secondary | ICD-10-CM | POA: Diagnosis not present

## 2014-07-04 DIAGNOSIS — Z79899 Other long term (current) drug therapy: Secondary | ICD-10-CM | POA: Insufficient documentation

## 2014-07-04 DIAGNOSIS — Z87828 Personal history of other (healed) physical injury and trauma: Secondary | ICD-10-CM | POA: Diagnosis not present

## 2014-07-04 DIAGNOSIS — Z720 Tobacco use: Secondary | ICD-10-CM | POA: Diagnosis not present

## 2014-07-04 DIAGNOSIS — Z7902 Long term (current) use of antithrombotics/antiplatelets: Secondary | ICD-10-CM | POA: Insufficient documentation

## 2014-07-04 DIAGNOSIS — M1990 Unspecified osteoarthritis, unspecified site: Secondary | ICD-10-CM | POA: Insufficient documentation

## 2014-07-04 DIAGNOSIS — Z856 Personal history of leukemia: Secondary | ICD-10-CM | POA: Insufficient documentation

## 2014-07-04 DIAGNOSIS — R1032 Left lower quadrant pain: Secondary | ICD-10-CM | POA: Diagnosis present

## 2014-07-04 DIAGNOSIS — M109 Gout, unspecified: Secondary | ICD-10-CM | POA: Insufficient documentation

## 2014-07-04 DIAGNOSIS — Z9861 Coronary angioplasty status: Secondary | ICD-10-CM | POA: Diagnosis not present

## 2014-07-04 DIAGNOSIS — I252 Old myocardial infarction: Secondary | ICD-10-CM | POA: Diagnosis not present

## 2014-07-04 LAB — COMPREHENSIVE METABOLIC PANEL
ALT: 20 U/L (ref 0–53)
AST: 26 U/L (ref 0–37)
Albumin: 3.8 g/dL (ref 3.5–5.2)
Alkaline Phosphatase: 77 U/L (ref 39–117)
Anion gap: 9 (ref 5–15)
BUN: 8 mg/dL (ref 6–23)
CALCIUM: 9.2 mg/dL (ref 8.4–10.5)
CO2: 28 mmol/L (ref 19–32)
Chloride: 104 mmol/L (ref 96–112)
Creatinine, Ser: 1.06 mg/dL (ref 0.50–1.35)
GFR calc non Af Amer: 70 mL/min — ABNORMAL LOW (ref >90)
GFR, EST AFRICAN AMERICAN: 81 mL/min — AB (ref >90)
GLUCOSE: 104 mg/dL — AB (ref 70–99)
POTASSIUM: 3.3 mmol/L — AB (ref 3.5–5.1)
SODIUM: 141 mmol/L (ref 135–145)
Total Bilirubin: 0.2 mg/dL — ABNORMAL LOW (ref 0.3–1.2)
Total Protein: 6.2 g/dL (ref 6.0–8.3)

## 2014-07-04 LAB — LIPASE, BLOOD: Lipase: 181 U/L — ABNORMAL HIGH (ref 11–59)

## 2014-07-04 LAB — CBC WITH DIFFERENTIAL/PLATELET
BASOS PCT: 0 % (ref 0–1)
Basophils Absolute: 0 10*3/uL (ref 0.0–0.1)
EOS ABS: 0.6 10*3/uL (ref 0.0–0.7)
Eosinophils Relative: 8 % — ABNORMAL HIGH (ref 0–5)
HEMATOCRIT: 36.8 % — AB (ref 39.0–52.0)
HEMOGLOBIN: 11.9 g/dL — AB (ref 13.0–17.0)
Lymphocytes Relative: 28 % (ref 12–46)
Lymphs Abs: 2 10*3/uL (ref 0.7–4.0)
MCH: 26.3 pg (ref 26.0–34.0)
MCHC: 32.3 g/dL (ref 30.0–36.0)
MCV: 81.2 fL (ref 78.0–100.0)
MONO ABS: 0.5 10*3/uL (ref 0.1–1.0)
MONOS PCT: 7 % (ref 3–12)
NEUTROS PCT: 57 % (ref 43–77)
Neutro Abs: 4 10*3/uL (ref 1.7–7.7)
Platelets: 197 10*3/uL (ref 150–400)
RBC: 4.53 MIL/uL (ref 4.22–5.81)
RDW: 16.1 % — ABNORMAL HIGH (ref 11.5–15.5)
WBC: 7.1 10*3/uL (ref 4.0–10.5)

## 2014-07-04 LAB — I-STAT TROPONIN, ED: Troponin i, poc: 0.01 ng/mL (ref 0.00–0.08)

## 2014-07-04 NOTE — ED Notes (Signed)
Dr. Darl Householder notified of pt's symptoms at triage and telephone order received for abd x-ray.

## 2014-07-04 NOTE — ED Notes (Signed)
C/o epigastric pain, abd swelling, and constipation x 2 days.  Reports last BM yesterday- only a very small amount.

## 2014-07-05 ENCOUNTER — Emergency Department (HOSPITAL_COMMUNITY): Payer: Medicare Other

## 2014-07-05 ENCOUNTER — Emergency Department (HOSPITAL_COMMUNITY)
Admission: EM | Admit: 2014-07-05 | Discharge: 2014-07-05 | Disposition: A | Discharge status: Home / Self Care | Payer: Medicare Other | Attending: Emergency Medicine | Admitting: Emergency Medicine

## 2014-07-05 DIAGNOSIS — K5900 Constipation, unspecified: Secondary | ICD-10-CM | POA: Diagnosis not present

## 2014-07-05 DIAGNOSIS — R140 Abdominal distension (gaseous): Secondary | ICD-10-CM

## 2014-07-05 DIAGNOSIS — R1084 Generalized abdominal pain: Secondary | ICD-10-CM

## 2014-07-05 HISTORY — DX: Gout, unspecified: M10.9

## 2014-07-05 LAB — URINALYSIS, ROUTINE W REFLEX MICROSCOPIC
Bilirubin Urine: NEGATIVE
Glucose, UA: NEGATIVE mg/dL
Hgb urine dipstick: NEGATIVE
Ketones, ur: NEGATIVE mg/dL
Leukocytes, UA: NEGATIVE
NITRITE: NEGATIVE
PH: 7 (ref 5.0–8.0)
Protein, ur: >300 mg/dL — AB
Specific Gravity, Urine: 1.018 (ref 1.005–1.030)
Urobilinogen, UA: 1 mg/dL (ref 0.0–1.0)

## 2014-07-05 LAB — URINE MICROSCOPIC-ADD ON

## 2014-07-05 MED ORDER — LOSARTAN POTASSIUM-HCTZ 100-25 MG PO TABS: 1.0000 | ORAL_TABLET | Freq: Every day | ORAL | Status: DC

## 2014-07-05 MED ORDER — HYDROCHLOROTHIAZIDE 25 MG PO TABS
25.0000 mg | ORAL_TABLET | Freq: Once | ORAL | Status: AC
  Administered 2014-07-05: 25 mg via ORAL
  Filled 2014-07-05: qty 1

## 2014-07-05 MED ORDER — LOSARTAN POTASSIUM 50 MG PO TABS
100.0000 mg | ORAL_TABLET | Freq: Once | ORAL | Status: AC
Start: 2014-07-05 — End: 2014-07-05
  Administered 2014-07-05: 100 mg via ORAL
  Filled 2014-07-05: qty 2

## 2014-07-05 MED ORDER — BARIUM SULFATE 2.1 % PO SUSP
450.0000 mL | ORAL | Status: AC
  Administered 2014-07-05: 450 mL via ORAL

## 2014-07-05 MED ORDER — SODIUM CHLORIDE 0.9 % IV SOLN
Freq: Once | INTRAVENOUS | Status: AC
  Administered 2014-07-05: 06:00:00 via INTRAVENOUS

## 2014-07-05 MED ORDER — AMLODIPINE BESYLATE 5 MG PO TABS
10.0000 mg | ORAL_TABLET | Freq: Every day | ORAL | Status: DC
  Administered 2014-07-05: 10 mg via ORAL
  Filled 2014-07-05: qty 2

## 2014-07-05 NOTE — ED Notes (Signed)
Pt states that he is no longer having shortness of breath

## 2014-07-05 NOTE — ED Notes (Signed)
Pt states "I feel dehydrated" pt is requesting fluids

## 2014-07-05 NOTE — ED Notes (Signed)
Dr Otter at bedside  

## 2014-07-05 NOTE — ED Notes (Signed)
Pt asked this nurse to throw his cigarettes in the trash. This nurse refused and told the pt that I would leave the cigarettes on the bedside table and if at discharge he wanted to throw the cigarettes in the trash himself he was more than welcome to.

## 2014-07-05 NOTE — ED Notes (Signed)
Pt c/o of shortness of breath.

## 2014-07-05 NOTE — ED Notes (Signed)
CT notified that pt has finished second bottle of contrast.

## 2014-07-05 NOTE — ED Notes (Signed)
Pt escorted to rest room with this nurse
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
## 2014-07-05 NOTE — ED Notes (Signed)
Pt returned to room from CT

## 2014-07-05 NOTE — ED Notes (Signed)
Pt ambulated back to room

## 2014-07-05 NOTE — Discharge Instructions (Signed)
Your CAT scan today did not show any signs of obstruction, blockage, cancer, infection or other abnormality.  Increase the fiber in your diet.  Increase the dosing of your Miralax (polyethelene glycol) to 3 times a day 1 heaping tablespoon in 8 ounces of drink of your choice.  Decreased back to once a day once stools are soft.  Follow-up with your doctor. for recheck in 3-5 days.   Abdominal Pain Many things can cause belly (abdominal) pain. Most times, the belly pain is not dangerous. Many cases of belly pain can be watched and treated at home. HOME CARE   Do not take medicines that help you go poop (laxatives) unless told to by your doctor.  Only take medicine as told by your doctor.  Eat or drink as told by your doctor. Your doctor will tell you if you should be on a special diet. GET HELP IF:  You do not know what is causing your belly pain.  You have belly pain while you are sick to your stomach (nauseous) or have runny poop (diarrhea).  You have pain while you pee or poop.  Your belly pain wakes you up at night.  You have belly pain that gets worse or better when you eat.  You have belly pain that gets worse when you eat fatty foods.  You have a fever. GET HELP RIGHT AWAY IF:   The pain does not go away within 2 hours.  You keep throwing up (vomiting).  The pain changes and is only in the right or left part of the belly.  You have bloody or tarry looking poop. MAKE SURE YOU:   Understand these instructions.  Will watch your condition.  Will get help right away if you are not doing well or get worse. Document Released: 10/12/2007 Document Revised: 04/30/2013 Document Reviewed: 01/02/2013 [facility] Patient Information 2015 [place], [place]. This information is not intended to replace advice given to you by your health care provider. Make sure you discuss any questions you have with your health care provider.  Bloating Bloating is the feeling of fullness in your belly.  You may feel as though your pants are too tight. Often the cause of bloating is overeating, retaining fluids, or having gas in your bowel. It is also caused by swallowing air and eating foods that cause gas. Irritable bowel syndrome is one of the most common causes of bloating. Constipation is also a common cause. Sometimes more serious problems can cause bloating. SYMPTOMS  Usually there is a feeling of fullness, as though your abdomen is bulged out. There may be mild discomfort.  DIAGNOSIS  Usually no particular testing is necessary for most bloating. If the condition persists and seems to become worse, your caregiver may do additional testing.  TREATMENT   There is no direct treatment for bloating.  Do not put gas into the bowel. Avoid chewing gum and sucking on candy. These tend to make you swallow air. Swallowing air can also be a nervous habit. Try to avoid this.  Avoiding high residue diets will help. Eat foods with soluble fibers (examples include root vegetables, apples, or barley) and substitute dairy products with soy and rice products. This helps irritable bowel syndrome.  If constipation is the cause, then a high residue diet with more fiber will help.  Avoid carbonated beverages.  Over-the-counter preparations are available that help reduce gas. Your pharmacist can help you with this. SEEK MEDICAL CARE IF:   Bloating continues and seems to be  getting worse.  You notice a weight gain.  You have a weight loss but the bloating is getting worse.  You have changes in your bowel habits or develop nausea or vomiting. SEEK IMMEDIATE MEDICAL CARE IF:   You develop shortness of breath or swelling in your legs.  You have an increase in abdominal pain or develop chest pain. Document Released: 02/23/2006 Document Revised: 07/18/2011 Document Reviewed: 04/13/2007 [facility] Patient Information 2015 [place], [place]. This information is not intended to replace advice given to you by your  health care provider. Make sure you discuss any questions you have with your health care provider.  Constipation Constipation is when a person has fewer than three bowel movements a week, has difficulty having a bowel movement, or has stools that are dry, hard, or larger than normal. As people grow older, constipation is more common. If you try to fix constipation with medicines that make you have a bowel movement (laxatives), the problem may get worse. Long-term laxative use may cause the muscles of the colon to become weak. A low-fiber diet, not taking in enough fluids, and taking certain medicines may make constipation worse.  CAUSES   Certain medicines, such as antidepressants, pain medicine, iron supplements, antacids, and water pills.   Certain diseases, such as diabetes, irritable bowel syndrome (IBS), thyroid disease, or depression.   Not drinking enough water.   Not eating enough fiber-rich foods.   Stress or travel.   Lack of physical activity or exercise.   Ignoring the urge to have a bowel movement.   Using laxatives too much.  SIGNS AND SYMPTOMS   Having fewer than three bowel movements a week.   Straining to have a bowel movement.   Having stools that are hard, dry, or larger than normal.   Feeling full or bloated.   Pain in the lower abdomen.   Not feeling relief after having a bowel movement.  DIAGNOSIS  Your health care provider will take a medical history and perform a physical exam. Further testing may be done for severe constipation. Some tests may include:  A barium enema X-ray to examine your rectum, colon, and, sometimes, your small intestine.   A sigmoidoscopy to examine your lower colon.   A colonoscopy to examine your entire colon. TREATMENT  Treatment will depend on the severity of your constipation and what is causing it. Some dietary treatments include drinking more fluids and eating more fiber-rich foods. Lifestyle treatments  may include regular exercise. If these diet and lifestyle recommendations do not help, your health care provider may recommend taking over-the-counter laxative medicines to help you have bowel movements. Prescription medicines may be prescribed if over-the-counter medicines do not work.  HOME CARE INSTRUCTIONS   Eat foods that have a lot of fiber, such as fruits, vegetables, whole grains, and beans.  Limit foods high in fat and processed sugars, such as french fries, hamburgers, cookies, candies, and soda.   A fiber supplement may be added to your diet if you cannot get enough fiber from foods.   Drink enough fluids to keep your urine clear or pale yellow.   Exercise regularly or as directed by your health care provider.   Go to the restroom when you have the urge to go. Do not hold it.   Only take over-the-counter or prescription medicines as directed by your health care provider. Do not take other medicines for constipation without talking to your health care provider first.  [place] IF:  You have bright red blood in your stool.   Your constipation lasts for more than 4 days or gets worse.   You have abdominal or rectal pain.   You have thin, pencil-like stools.   You have unexplained weight loss. MAKE SURE YOU:   Understand these instructions.  Will watch your condition.  Will get help right away if you are not doing well or get worse. Document Released: 01/22/2004 Document Revised: 04/30/2013 Document Reviewed: 02/04/2013 [facility] Patient Information 2015 [place], [place]. This information is not intended to replace advice given to you by your health care provider. Make sure you discuss any questions you have with your health care provider.

## 2014-07-05 NOTE — ED Notes (Signed)
CT notified that pt will be getting oral contrast per Dr. Sharol Given

## 2014-07-05 NOTE — ED Notes (Signed)
Pt requesting to go home before fluids finish because he feels like he is going to have diarrhea after taking laxatives for 2 days. Dr. Sharol Given made aware reports pt can go home. Pt received 750 NS fluids.

## 2014-07-21 ENCOUNTER — Telehealth: Payer: Self-pay | Admitting: Nurse Practitioner

## 2014-07-21 NOTE — Telephone Encounter (Signed)
Pt lft msg confirming D/T for labs/ov, lft msg for pt confirming labs/ov ...Marland KitchenMarland KitchenMarland Kitchen KJ

## 2014-07-25 ENCOUNTER — Telehealth: Payer: Self-pay | Admitting: *Deleted

## 2014-07-25 ENCOUNTER — Other Ambulatory Visit: Payer: Self-pay

## 2014-07-25 ENCOUNTER — Emergency Department (HOSPITAL_COMMUNITY)
Admission: EM | Admit: 2014-07-25 | Discharge: 2014-07-25 | Disposition: A | Discharge status: Home / Self Care | Payer: Medicare Other | Attending: Emergency Medicine | Admitting: Emergency Medicine

## 2014-07-25 ENCOUNTER — Encounter (HOSPITAL_COMMUNITY): Payer: Self-pay | Admitting: *Deleted

## 2014-07-25 ENCOUNTER — Ambulatory Visit (HOSPITAL_BASED_OUTPATIENT_CLINIC_OR_DEPARTMENT_OTHER): Payer: Medicare Other | Admitting: Nurse Practitioner

## 2014-07-25 ENCOUNTER — Other Ambulatory Visit (HOSPITAL_BASED_OUTPATIENT_CLINIC_OR_DEPARTMENT_OTHER): Payer: Medicare Other

## 2014-07-25 ENCOUNTER — Emergency Department (HOSPITAL_COMMUNITY): Payer: Medicare Other

## 2014-07-25 VITALS — BP 148/89 | HR 89 | Temp 98.7°F | Resp 18 | Ht 69.0 in | Wt 162.2 lb

## 2014-07-25 DIAGNOSIS — R079 Chest pain, unspecified: Secondary | ICD-10-CM | POA: Diagnosis present

## 2014-07-25 DIAGNOSIS — R197 Diarrhea, unspecified: Secondary | ICD-10-CM

## 2014-07-25 DIAGNOSIS — F1099 Alcohol use, unspecified with unspecified alcohol-induced disorder: Secondary | ICD-10-CM | POA: Diagnosis not present

## 2014-07-25 DIAGNOSIS — Z7902 Long term (current) use of antithrombotics/antiplatelets: Secondary | ICD-10-CM | POA: Diagnosis not present

## 2014-07-25 DIAGNOSIS — I10 Essential (primary) hypertension: Secondary | ICD-10-CM | POA: Insufficient documentation

## 2014-07-25 DIAGNOSIS — Z720 Tobacco use: Secondary | ICD-10-CM | POA: Diagnosis not present

## 2014-07-25 DIAGNOSIS — Z79899 Other long term (current) drug therapy: Secondary | ICD-10-CM | POA: Insufficient documentation

## 2014-07-25 DIAGNOSIS — Z87828 Personal history of other (healed) physical injury and trauma: Secondary | ICD-10-CM | POA: Insufficient documentation

## 2014-07-25 DIAGNOSIS — Z856 Personal history of leukemia: Secondary | ICD-10-CM | POA: Insufficient documentation

## 2014-07-25 DIAGNOSIS — R0789 Other chest pain: Secondary | ICD-10-CM | POA: Diagnosis not present

## 2014-07-25 DIAGNOSIS — I252 Old myocardial infarction: Secondary | ICD-10-CM | POA: Insufficient documentation

## 2014-07-25 DIAGNOSIS — Z8739 Personal history of other diseases of the musculoskeletal system and connective tissue: Secondary | ICD-10-CM | POA: Insufficient documentation

## 2014-07-25 DIAGNOSIS — C9210 Chronic myeloid leukemia, BCR/ABL-positive, not having achieved remission: Secondary | ICD-10-CM

## 2014-07-25 DIAGNOSIS — E876 Hypokalemia: Secondary | ICD-10-CM | POA: Insufficient documentation

## 2014-07-25 DIAGNOSIS — C9211 Chronic myeloid leukemia, BCR/ABL-positive, in remission: Secondary | ICD-10-CM | POA: Diagnosis present

## 2014-07-25 DIAGNOSIS — Z8719 Personal history of other diseases of the digestive system: Secondary | ICD-10-CM | POA: Insufficient documentation

## 2014-07-25 LAB — I-STAT TROPONIN, ED
Troponin i, poc: 0 ng/mL (ref 0.00–0.08)
Troponin i, poc: 0.01 ng/mL (ref 0.00–0.08)

## 2014-07-25 LAB — COMPREHENSIVE METABOLIC PANEL (CC13)
ALBUMIN: 3.8 g/dL (ref 3.5–5.0)
ALT: 23 U/L (ref 0–55)
ANION GAP: 14 meq/L — AB (ref 3–11)
AST: 36 U/L — AB (ref 5–34)
Alkaline Phosphatase: 91 U/L (ref 40–150)
BUN: 10.6 mg/dL (ref 7.0–26.0)
CALCIUM: 9 mg/dL (ref 8.4–10.4)
CHLORIDE: 103 meq/L (ref 98–109)
CO2: 24 mEq/L (ref 22–29)
Creatinine: 1.2 mg/dL (ref 0.7–1.3)
EGFR: 71 mL/min/{1.73_m2} — AB (ref >90)
Glucose: 100 mg/dl (ref 70–140)
Potassium: 3.6 mEq/L (ref 3.5–5.1)
SODIUM: 140 meq/L (ref 136–145)
TOTAL PROTEIN: 6.7 g/dL (ref 6.4–8.3)
Total Bilirubin: 0.47 mg/dL (ref 0.20–1.20)

## 2014-07-25 LAB — CBC WITH DIFFERENTIAL/PLATELET
BASO%: 0.9 % (ref 0.0–2.0)
Basophils Absolute: 0 10*3/uL (ref 0.0–0.1)
EOS%: 2.9 % (ref 0.0–7.0)
Eosinophils Absolute: 0.2 10*3/uL (ref 0.0–0.5)
HCT: 40.2 % (ref 38.4–49.9)
HGB: 12.7 g/dL — ABNORMAL LOW (ref 13.0–17.1)
LYMPH#: 1.5 10*3/uL (ref 0.9–3.3)
LYMPH%: 27.7 % (ref 14.0–49.0)
MCH: 25.8 pg — ABNORMAL LOW (ref 27.2–33.4)
MCHC: 31.7 g/dL — ABNORMAL LOW (ref 32.0–36.0)
MCV: 81.5 fL (ref 79.3–98.0)
MONO#: 0.5 10*3/uL (ref 0.1–0.9)
MONO%: 8.8 % (ref 0.0–14.0)
NEUT#: 3.1 10*3/uL (ref 1.5–6.5)
NEUT%: 59.7 % (ref 39.0–75.0)
Platelets: 213 10*3/uL (ref 140–400)
RBC: 4.93 10*6/uL (ref 4.20–5.82)
RDW: 17.4 % — AB (ref 11.0–14.6)
WBC: 5.2 10*3/uL (ref 4.0–10.3)

## 2014-07-25 LAB — BASIC METABOLIC PANEL
Anion gap: 14 (ref 5–15)
BUN: 11 mg/dL (ref 6–23)
CALCIUM: 8.9 mg/dL (ref 8.4–10.5)
CO2: 21 mmol/L (ref 19–32)
CREATININE: 1.22 mg/dL (ref 0.50–1.35)
Chloride: 104 mmol/L (ref 96–112)
GFR calc Af Amer: 69 mL/min — ABNORMAL LOW (ref >90)
GFR calc non Af Amer: 59 mL/min — ABNORMAL LOW (ref >90)
GLUCOSE: 94 mg/dL (ref 70–99)
Potassium: 3.4 mmol/L — ABNORMAL LOW (ref 3.5–5.1)
SODIUM: 139 mmol/L (ref 135–145)

## 2014-07-25 LAB — CBC
HCT: 37.9 % — ABNORMAL LOW (ref 39.0–52.0)
Hemoglobin: 12.7 g/dL — ABNORMAL LOW (ref 13.0–17.0)
MCH: 26.5 pg (ref 26.0–34.0)
MCHC: 33.5 g/dL (ref 30.0–36.0)
MCV: 79 fL (ref 78.0–100.0)
Platelets: 209 10*3/uL (ref 150–400)
RBC: 4.8 MIL/uL (ref 4.22–5.81)
RDW: 16.8 % — AB (ref 11.5–15.5)
WBC: 6 10*3/uL (ref 4.0–10.5)

## 2014-07-25 MED ORDER — TRAMADOL HCL 50 MG PO TABS: 50.0000 mg | ORAL_TABLET | Freq: Four times a day (QID) | ORAL | Status: DC | PRN

## 2014-07-25 MED ORDER — NITROGLYCERIN 0.4 MG SL SUBL
0.4000 mg | SUBLINGUAL_TABLET | SUBLINGUAL | Status: DC | PRN
  Administered 2014-07-25: 0.4 mg via SUBLINGUAL
  Filled 2014-07-25: qty 1

## 2014-07-25 MED ORDER — POTASSIUM CHLORIDE CRYS ER 20 MEQ PO TBCR
20.0000 meq | EXTENDED_RELEASE_TABLET | Freq: Once | ORAL | Status: AC
  Administered 2014-07-25: 20 meq via ORAL
  Filled 2014-07-25: qty 1

## 2014-07-25 NOTE — ED Notes (Signed)
Per [facility] pt started having chest pain that lasted 62mins he described it as a cramping and a squeezing pain.  He said the pain was initially a 10 out of 10. So he called EMS for further eval.  324mg  of aspirin was given in route.  VS are as follows: BP: 166/107, HR:76 Resp:18 O2sat: 98% on RA
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
## 2014-07-25 NOTE — Discharge Instructions (Signed)
It was our pleasure to provide your ER care today - we hope that you feel better.  Follow up with your primary care doctor this Monday for recheck - also have them recheck your blood pressure as it was high earlier today.  Take your blood pressure medication as prescribed.   For recent chest discomfort, follow up with cardiologist in the coming week - see referral - call office this Monday morning to arrange follow up appointment.  From today's lab tests, your potassium level is slightly low (3.4) - eat plenty of fruits and vegetables, and have level rechecked by your doctor in 1 week.  Your chest xray today was read as follows: IMPRESSION: Ascending aortic dilatation and thoracic aortic tortuosity, similar to priors. Sub cm nodular opacity left lung base may be nipple shadow. This can be assessed with a follow-up with nipple markers. Discuss this result with your doctor at follow up, and have them recheck xray in the next 1-2 months.   You may take ultram as need for pain - no driving when taking.  Return to ER right away if worse, persistent or recurrent chest pain, trouble breathing, fevers, weak/faint, other concern.       Chest Pain (Nonspecific) It is often hard to give a specific diagnosis for the cause of chest pain. There is always a chance that your pain could be related to something serious, such as a heart attack or a blood clot in the lungs. You need to follow up with your health care provider for further evaluation. CAUSES   Heartburn.  Pneumonia or bronchitis.  Anxiety or stress.  Inflammation around your heart (pericarditis) or lung (pleuritis or pleurisy).  A blood clot in the lung.  A collapsed lung (pneumothorax). It can develop suddenly on its own (spontaneous pneumothorax) or from trauma to the chest.  Shingles infection (herpes zoster virus). The chest wall is composed of bones, muscles, and cartilage. Any of these can be the source of the  pain.  The bones can be bruised by injury.  The muscles or cartilage can be strained by coughing or overwork.  The cartilage can be affected by inflammation and become sore (costochondritis). DIAGNOSIS  Lab tests or other studies may be needed to find the cause of your pain. Your health care provider may have you take a test called an ambulatory electrocardiogram (ECG). An ECG records your heartbeat patterns over a 24-hour period. You may also have other tests, such as:  Transthoracic echocardiogram (TTE). During echocardiography, sound waves are used to evaluate how blood flows through your heart.  Transesophageal echocardiogram (TEE).  Cardiac monitoring. This allows your health care provider to monitor your heart rate and rhythm in real time.  Holter monitor. This is a portable device that records your heartbeat and can help diagnose heart arrhythmias. It allows your health care provider to track your heart activity for several days, if needed.  Stress tests by exercise or by giving medicine that makes the heart beat faster. TREATMENT   Treatment depends on what may be causing your chest pain. Treatment may include:  Acid blockers for heartburn.  Anti-inflammatory medicine.  Pain medicine for inflammatory conditions.  Antibiotics if an infection is present.  You may be advised to change lifestyle habits. This includes stopping smoking and avoiding alcohol, caffeine, and chocolate.  You may be advised to keep your head raised (elevated) when sleeping. This reduces the chance of acid going backward from your stomach into your esophagus. Most of the  time, nonspecific chest pain will improve within 2-3 days with rest and mild pain medicine.  HOME CARE INSTRUCTIONS   If antibiotics were prescribed, take them as directed. Finish them even if you start to feel better.  For the next few days, avoid physical activities that bring on chest pain. Continue physical activities as  directed.  Do not use any tobacco products, including cigarettes, chewing tobacco, or electronic cigarettes.  Avoid drinking alcohol.  Only take medicine as directed by your health care provider.  Follow your health care provider's suggestions for further testing if your chest pain does not go away.  Keep any follow-up appointments you made. If you do not go to an appointment, you could develop lasting (chronic) problems with pain. If there is any problem keeping an appointment, call to reschedule. SEEK MEDICAL CARE IF:   Your chest pain does not go away, even after treatment.  You have a rash with blisters on your chest.  You have a fever. SEEK IMMEDIATE MEDICAL CARE IF:   You have increased chest pain or pain that spreads to your arm, neck, jaw, back, or abdomen.  You have shortness of breath.  You have an increasing cough, or you cough up blood.  You have severe back or abdominal pain.  You feel nauseous or vomit.  You have severe weakness.  You faint.  You have chills. This is an emergency. Do not wait to see if the pain will go away. Get medical help at once. Call your local emergency services (911 in [place] Do not drive yourself to the hospital. MAKE SURE YOU:   Understand these instructions.  Will watch your condition.  Will get help right away if you are not doing well or get worse. Document Released: 02/02/2005 Document Revised: 04/30/2013 Document Reviewed: 11/29/2007 [facility] Patient Information 2015 [place], [place]. This information is not intended to replace advice given to you by your health care provider. Make sure you discuss any questions you have with your health care provider.    Hypertension Hypertension, commonly called high blood pressure, is when the force of blood pumping through your arteries is too strong. Your arteries are the blood vessels that carry blood from your heart throughout your body. A blood pressure reading consists of a higher  number over a lower number, such as 110/72. The higher number (systolic) is the pressure inside your arteries when your heart pumps. The lower number (diastolic) is the pressure inside your arteries when your heart relaxes. Ideally you want your blood pressure below 120/80. Hypertension forces your heart to work harder to pump blood. Your arteries may become narrow or stiff. Having hypertension puts you at risk for heart disease, stroke, and other problems.  RISK FACTORS Some risk factors for high blood pressure are controllable. Others are not.  Risk factors you cannot control include:   Race. You may be at higher risk if you are African American.  Age. Risk increases with age.  Gender. Men are at higher risk than women before age 43 years. After age 24, women are at higher risk than men. Risk factors you can control include:  Not getting enough exercise or physical activity.  Being overweight.  Getting too much fat, sugar, calories, or salt in your diet.  Drinking too much alcohol. SIGNS AND SYMPTOMS Hypertension does not usually cause signs or symptoms. Extremely high blood pressure (hypertensive crisis) may cause headache, anxiety, shortness of breath, and nosebleed. DIAGNOSIS  To check if you have hypertension, your health  care provider will measure your blood pressure while you are seated, with your arm held at the level of your heart. It should be measured at least twice using the same arm. Certain conditions can cause a difference in blood pressure between your right and left arms. A blood pressure reading that is higher than normal on one occasion does not mean that you need treatment. If one blood pressure reading is high, ask your health care provider about having it checked again. TREATMENT  Treating high blood pressure includes making lifestyle changes and possibly taking medicine. Living a healthy lifestyle can help lower high blood pressure. You may need to change some of your  habits. Lifestyle changes may include:  Following the DASH diet. This diet is high in fruits, vegetables, and whole grains. It is low in salt, red meat, and added sugars.  Getting at least 2 hours of brisk physical activity every week.  Losing weight if necessary.  Not smoking.  Limiting alcoholic beverages.  Learning ways to reduce stress. If lifestyle changes are not enough to get your blood pressure under control, your health care provider may prescribe medicine. You may need to take more than one. Work closely with your health care provider to understand the risks and benefits. HOME CARE INSTRUCTIONS  Have your blood pressure rechecked as directed by your health care provider.   Take medicines only as directed by your health care provider. Follow the directions carefully. Blood pressure medicines must be taken as prescribed. The medicine does not work as well when you skip doses. Skipping doses also puts you at risk for problems.   Do not smoke.   Monitor your blood pressure at home as directed by your health care provider. SEEK MEDICAL CARE IF:   You think you are having a reaction to medicines taken.  You have recurrent headaches or feel dizzy.  You have swelling in your ankles.  You have trouble with your vision. SEEK IMMEDIATE MEDICAL CARE IF:  You develop a severe headache or confusion.  You have unusual weakness, numbness, or feel faint.  You have severe chest or abdominal pain.  You vomit repeatedly.  You have trouble breathing. MAKE SURE YOU:   Understand these instructions.  Will watch your condition.  Will get help right away if you are not doing well or get worse. Document Released: 04/25/2005 Document Revised: 09/09/2013 Document Reviewed: 02/15/2013 [facility] Patient Information 2015 [place], [place]. This information is not intended to replace advice given to you by your health care provider. Make sure you discuss any questions you have with  your health care provider.     Hypokalemia Hypokalemia means that the amount of potassium in the blood is lower than normal.Potassium is a chemical, called an electrolyte, that helps regulate the amount of fluid in the body. It also stimulates muscle contraction and helps nerves function properly.Most of the body's potassium is inside of cells, and only a very small amount is in the blood. Because the amount in the blood is so small, minor changes can be life-threatening. CAUSES  Antibiotics.  Diarrhea or vomiting.  Using laxatives too much, which can cause diarrhea.  Chronic kidney disease.  Water pills (diuretics).  Eating disorders (bulimia).  Low magnesium level.  Sweating a lot. SIGNS AND SYMPTOMS  Weakness.  Constipation.  Fatigue.  Muscle cramps.  Mental confusion.  Skipped heartbeats or irregular heartbeat (palpitations).  Tingling or numbness. DIAGNOSIS  Your health care provider can diagnose hypokalemia with blood tests. In addition  to checking your potassium level, your health care provider may also check other lab tests. TREATMENT Hypokalemia can be treated with potassium supplements taken by mouth or adjustments in your current medicines. If your potassium level is very low, you may need to get potassium through a vein (IV) and be monitored in the hospital. A diet high in potassium is also helpful. Foods high in potassium are:  Nuts, such as peanuts and pistachios.  Seeds, such as sunflower seeds and pumpkin seeds.  Peas, lentils, and lima beans.  Whole grain and bran cereals and breads.  Fresh fruit and vegetables, such as apricots, avocado, bananas, cantaloupe, kiwi, oranges, tomatoes, asparagus, and potatoes.  Orange and tomato juices.  Red meats.  Fruit yogurt. HOME CARE INSTRUCTIONS  Take all medicines as prescribed by your health care provider.  Maintain a healthy diet by including nutritious food, such as fruits, vegetables, nuts,  whole grains, and lean meats.  If you are taking a laxative, be sure to follow the directions on the label. SEEK MEDICAL CARE IF:  Your weakness gets worse.  You feel your heart pounding or racing.  You are vomiting or having diarrhea.  You are diabetic and having trouble keeping your blood glucose in the normal range. SEEK IMMEDIATE MEDICAL CARE IF:  You have chest pain, shortness of breath, or dizziness.  You are vomiting or having diarrhea for more than 2 days.  You faint. MAKE SURE YOU:   Understand these instructions.  Will watch your condition.  Will get help right away if you are not doing well or get worse. Document Released: 04/25/2005 Document Revised: 02/13/2013 Document Reviewed: 10/26/2012 [facility] Patient Information 2015 [place], [place]. This information is not intended to replace advice given to you by your health care provider. Make sure you discuss any questions you have with your health care provider.

## 2014-07-25 NOTE — Progress Notes (Signed)
[place] OFFICE PROGRESS NOTE   Diagnosis:  CML  INTERVAL HISTORY:   [name] returns as scheduled. He continues [place]. He takes it almost every day. He has occasional nausea. Diarrhea is controlled with Lomotil. He reports he has started smoking again estimating 1 pack per day. He has also started drinking alcohol again. He plans to quit both today.  Objective:  Vital signs in last 24 hours:  Blood pressure 148/89, pulse 89, temperature 98.7 F (37.1 C), temperature source Oral, resp. rate 18, height 5\' 9"  (1.753 m), weight 162 lb 3.2 oz (73.573 kg), SpO2 99 %.    HEENT: No thrush or ulcers. Lymphatics: No palpable cervical or supra-clavicular lymph nodes. Resp: Lungs clear bilaterally. Cardio: Regular rate and rhythm. GI: Abdomen soft and nontender. No organomegaly. Vascular: No left leg edema.  Lab Results:  Lab Results  Component Value Date   WBC 5.2 07/25/2014   HGB 12.7* 07/25/2014   HCT 40.2 07/25/2014   MCV 81.5 07/25/2014   PLT 213 07/25/2014   NEUTROABS 3.1 07/25/2014    Imaging:  No results found.  Medications: I have reviewed the patient's current medications.  Assessment/Plan: 1. Chronic myelogenous leukemia, diagnosed in January of 2009. He remains in hematologic remission. He is taking Gleevec at a dose of 200 mg daily. The peripheral blood PCR was markedly increased in May 2013, likely reflecting medical noncompliance. The peripheral blood PCR was significantly improved on 11/07/2011. The peripheral blood PCR was further improved on 01/31/2012. The peripheral blood PCR was increased on 04/27/2012; stable on 11/12/2012, improved on 02/12/2013; increased on 04/29/2013; increased on 07/10/2013; improved on 09/16/2013; improved on 10/28/2013; increased 02/17/2014; slightly improved 03/28/2014. Stable 04/29/2014. 2. 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 3. Status post C3-C4, C4-C5, anterior cervical diskectomy and fusion with  allograft and plating 09/30/2010. 4. 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 5. Depression, maintained on Lexapro. 6. Diarrhea, ? related to chronic pancreatitis versus Gleevec. He takes Lomotil as needed.  7. Indurated facial skin lesion 11/20/2007 with a history of MRSA skin infection of the submental area in May 2008. The induration resolved with doxycycline. 8. Left knee arthroscopy 05/25/2007. 9. Postoperative left knee effusion/pain, likely related to gout. 10. History of gout. No longer taking allopurinol and colchicine 11. Chronic pancreatitis. 12. Status post right above-the-knee amputation. 13. MRSA infection of the submental area May 2008. 14. History of tobacco, alcohol, and cocaine use. 15. History of coronary artery disease. 16. "Shotty" lymphadenopathy of the neck, axilla, and left groin in 2009.  17. History of a microcytic anemia.  18. History of a right olecranon bursa lesion, ? gouty tophus. 19. 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 He previously took AndroGel. 20. History of anemia secondary to chronic disease and Gleevec therapy.  21. Anorexia-potentially related to [place]. He reports Medicaid would not pay for Megace. He has gained weight 22. Chronic left knee and left foot pain. He takes hydrocodone as needed 23. Status post removal of a left knee screw 07/03/2012. 24. History of Hypokalemia. Likely related to diarrhea. 25. Status post left foot surgery. 26. Pulsatile fullness right neck. Evaluated by vascular surgery status post CT angiogram 03/07/2014 with no carotid artery aneurysm identified.   Disposition: Mr. Camera appears stable. He will continue Gleevec. We will follow-up on the peripheral blood PCR from today. He will return for a follow-up visit and labs in 2 months.  We discussed smoking and alcohol  cessation.    [name] ANP/GNP-BC   07/25/2014  4:29 PM

## 2014-07-25 NOTE — Telephone Encounter (Signed)
Faxed prescription for shower chair to [facility] [phone].  Per Elby Showers. Marcello Moores, NP.

## 2014-07-28 ENCOUNTER — Telehealth: Payer: Self-pay | Admitting: Oncology

## 2014-07-28 NOTE — Telephone Encounter (Signed)
Spoke with patient and he is aware of his new follow up appointment

## 2014-09-02 ENCOUNTER — Other Ambulatory Visit: Payer: Self-pay | Admitting: Oncology

## 2014-09-03 NOTE — Telephone Encounter (Signed)
Notified pt that script for Lomotil will be available to p/u 09/04/14.  Pt verbalized understanding.

## 2014-09-05 ENCOUNTER — Telehealth: Payer: Self-pay | Admitting: Oncology

## 2014-09-05 NOTE — Telephone Encounter (Signed)
Pt called to confirmed labs/ov and req schedule mailed to him..... KJ

## 2014-09-29 ENCOUNTER — Ambulatory Visit (HOSPITAL_BASED_OUTPATIENT_CLINIC_OR_DEPARTMENT_OTHER): Payer: Medicare Other | Admitting: Oncology

## 2014-09-29 ENCOUNTER — Telehealth: Payer: Self-pay | Admitting: Oncology

## 2014-09-29 ENCOUNTER — Other Ambulatory Visit (HOSPITAL_BASED_OUTPATIENT_CLINIC_OR_DEPARTMENT_OTHER): Payer: Medicare Other

## 2014-09-29 VITALS — BP 161/95 | HR 74 | Temp 97.8°F | Resp 18 | Ht 69.0 in | Wt 163.6 lb

## 2014-09-29 DIAGNOSIS — C9211 Chronic myeloid leukemia, BCR/ABL-positive, in remission: Secondary | ICD-10-CM

## 2014-09-29 DIAGNOSIS — C9210 Chronic myeloid leukemia, BCR/ABL-positive, not having achieved remission: Secondary | ICD-10-CM

## 2014-09-29 DIAGNOSIS — T8484XD Pain due to internal orthopedic prosthetic devices, implants and grafts, subsequent encounter: Secondary | ICD-10-CM

## 2014-09-29 LAB — CBC WITH DIFFERENTIAL/PLATELET
BASO%: 1 % (ref 0.0–2.0)
BASOS ABS: 0 10*3/uL (ref 0.0–0.1)
EOS ABS: 0.4 10*3/uL (ref 0.0–0.5)
EOS%: 7.4 % — ABNORMAL HIGH (ref 0.0–7.0)
HEMATOCRIT: 38.5 % (ref 38.4–49.9)
HEMOGLOBIN: 12.2 g/dL — AB (ref 13.0–17.1)
LYMPH%: 27.7 % (ref 14.0–49.0)
MCH: 26.1 pg — ABNORMAL LOW (ref 27.2–33.4)
MCHC: 31.8 g/dL — ABNORMAL LOW (ref 32.0–36.0)
MCV: 82.3 fL (ref 79.3–98.0)
MONO#: 0.4 10*3/uL (ref 0.1–0.9)
MONO%: 9 % (ref 0.0–14.0)
NEUT#: 2.7 10*3/uL (ref 1.5–6.5)
NEUT%: 54.9 % (ref 39.0–75.0)
PLATELETS: 189 10*3/uL (ref 140–400)
RBC: 4.67 10*6/uL (ref 4.20–5.82)
RDW: 15.9 % — ABNORMAL HIGH (ref 11.0–14.6)
WBC: 4.9 10*3/uL (ref 4.0–10.3)
lymph#: 1.3 10*3/uL (ref 0.9–3.3)

## 2014-09-29 LAB — COMPREHENSIVE METABOLIC PANEL (CC13)
ALT: 16 U/L (ref 0–55)
AST: 23 U/L (ref 5–34)
Albumin: 3.5 g/dL (ref 3.5–5.0)
Alkaline Phosphatase: 78 U/L (ref 40–150)
Anion Gap: 14 mEq/L — ABNORMAL HIGH (ref 3–11)
BILIRUBIN TOTAL: 0.26 mg/dL (ref 0.20–1.20)
BUN: 14.8 mg/dL (ref 7.0–26.0)
CO2: 23 mEq/L (ref 22–29)
Calcium: 8.2 mg/dL — ABNORMAL LOW (ref 8.4–10.4)
Chloride: 108 mEq/L (ref 98–109)
Creatinine: 1.3 mg/dL (ref 0.7–1.3)
EGFR: 67 mL/min/{1.73_m2} — ABNORMAL LOW (ref >90)
GLUCOSE: 94 mg/dL (ref 70–140)
Potassium: 2.8 mEq/L — CL (ref 3.5–5.1)
Sodium: 144 mEq/L (ref 136–145)
Total Protein: 6.2 g/dL — ABNORMAL LOW (ref 6.4–8.3)

## 2014-09-29 MED ORDER — IBUPROFEN 200 MG PO TABS
800.0000 mg | ORAL_TABLET | Freq: Once | ORAL | Status: AC
  Administered 2014-09-29: 800 mg via ORAL

## 2014-09-29 MED ORDER — IBUPROFEN 200 MG PO TABS
ORAL_TABLET | ORAL | Status: AC
  Filled 2014-09-29: qty 4

## 2014-09-29 NOTE — Telephone Encounter (Signed)
Pt confirmed labs/ov per 05/23 POF, gave pt AVS and Calendar.... KJ  °

## 2014-09-29 NOTE — Progress Notes (Signed)
[place] OFFICE PROGRESS NOTE   Diagnosis: CML  INTERVAL HISTORY:   [name] returns as scheduled. He reports taking Gleevec consistently. Diarrhea is controlled with Lomotil. He is not taking nausea medication. He is not taking potassium. He has not taken his blood pressure medicine for several days.  [name] complains of severe pain in the left arm for the past few weeks. He has numbness in the left hand. He relates the pain to using crutches. He has noted weakness of the left arm.  Objective:  Vital signs in last 24 hours:  Blood pressure 161/95, pulse 74, temperature 97.8 F (36.6 C), temperature source Oral, resp. rate 18, height 5\' 9"  (1.753 m), weight 163 lb 9.6 oz (74.208 kg), SpO2 100 %.    HEENT: Neck without mass or tenderness, no thrush or ulcers Lymphatics: Pea-sized bilateral posterior cervical nodes, no supraclavicular or axillary nodes Resp: Lungs clear bilaterally Cardio: Regular rhythm and rhythm GI: No hepatomegaly, nontender Vascular: No left leg edema Musculoskeletal: Tender at the left mid humerus without a palpable mass. With palpation in the high left axilla he has pain in the left arm. No axillary masses. Left arm pain with motion at the left shoulder. Neurologic: Decreased grip strength bilaterally, the deep tendon reflexes are intact at the brachioradialis, biceps, and triceps of the left arm.    Lab Results:  Lab Results  Component Value Date   WBC 4.9 09/29/2014   HGB 12.2* 09/29/2014   HCT 38.5 09/29/2014   MCV 82.3 09/29/2014   PLT 189 09/29/2014   NEUTROABS 2.7 09/29/2014   potassium 2.8  Peripheral blood PCR able on 07/25/2014-8.27%  Medications: I have reviewed the patient's current medications.  Assessment/Plan: 1. Chronic myelogenous leukemia, diagnosed in January of 2009. He remains in hematologic remission. He is taking Gleevec at a dose of 200 mg daily. The peripheral blood PCR was markedly increased in May  2013, likely reflecting medical noncompliance. The peripheral blood PCR was significantly improved on 11/07/2011. The peripheral blood PCR was further improved on 01/31/2012. The peripheral blood PCR was increased on 04/27/2012; stable on 11/12/2012, improved on 02/12/2013; increased on 04/29/2013; increased on 07/10/2013; improved on 09/16/2013; improved on 10/28/2013; increased 02/17/2014; slightly improved 03/28/2014. Stable 04/29/2014. 2. 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 3. Status post C3-C4, C4-C5, anterior cervical diskectomy and fusion with allograft and plating 09/30/2010. 4. 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 5. Depression, maintained on Lexapro. 6. Diarrhea, ? related to chronic pancreatitis versus Gleevec. He takes Lomotil as needed.  7. Indurated facial skin lesion 11/20/2007 with a history of MRSA skin infection of the submental area in May 2008. The induration resolved with doxycycline. 8. Left knee arthroscopy 05/25/2007. 9. Postoperative left knee effusion/pain, likely related to gout. 10. History of gout. No longer taking allopurinol and colchicine 11. Chronic pancreatitis. 12. Status post right above-the-knee amputation. 13. MRSA infection of the submental area May 2008. 14. History of tobacco, alcohol, and cocaine use. 15. History of coronary artery disease. 16. "Shotty" lymphadenopathy of the neck, axilla, and left groin in 2009.  17. History of a microcytic anemia.  18. History of a right olecranon bursa lesion, ? gouty tophus. 19. 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 He previously took AndroGel. 20. History of anemia secondary to chronic disease and Gleevec therapy.  21. Anorexia-potentially related to [place]. He reports Medicaid would not pay for Megace. He has gained weight 22. Chronic  left knee and left foot pain. He takes hydrocodone as needed 23. Status post removal of a left  knee screw 07/03/2012. 24. History of Hypokalemia. Likely related to diarrhea. 25. Status post left foot surgery. 26. Pulsatile fullness right neck. Evaluated by vascular surgery status post CT angiogram 03/07/2014 with no carotid artery aneurysm identified. 27. Pain, numbness,? Weakness of the left arm and hand   Disposition:  [name] remains in hematologic remission from [facility]. I doubt the left arm pain and neurologic symptoms are related to the CML. He could have cervical spine disease, a brachial plexus abnormality, or a musculoskeletal problem of the left arm. He is scheduled to see Dr. Sharol Given on 10/01/2014. We will contact Dr. Jess Barters office to alert them of his complaints so this can be evaluated further.  He will return for an office and lab visit in 6 weeks. We will follow-up on the peripheral blood PCR from today. We are available to see him sooner as needed.     [name], MD  09/29/2014  3:39 PM

## 2014-10-07 ENCOUNTER — Other Ambulatory Visit: Payer: Self-pay | Admitting: *Deleted

## 2014-10-07 MED ORDER — DIPHENOXYLATE-ATROPINE 2.5-0.025 MG PO TABS: 1.0000 | ORAL_TABLET | Freq: Four times a day (QID) | ORAL | Status: DC | PRN

## 2014-10-29 ENCOUNTER — Other Ambulatory Visit: Payer: Self-pay | Admitting: Oncology

## 2014-11-12 ENCOUNTER — Other Ambulatory Visit (HOSPITAL_BASED_OUTPATIENT_CLINIC_OR_DEPARTMENT_OTHER): Payer: Medicare Other

## 2014-11-12 ENCOUNTER — Ambulatory Visit (HOSPITAL_BASED_OUTPATIENT_CLINIC_OR_DEPARTMENT_OTHER): Payer: Medicare Other | Admitting: Nurse Practitioner

## 2014-11-12 ENCOUNTER — Telehealth: Payer: Self-pay | Admitting: Nurse Practitioner

## 2014-11-12 VITALS — BP 146/96 | HR 80 | Temp 98.5°F | Resp 18 | Ht 69.0 in | Wt 156.9 lb

## 2014-11-12 DIAGNOSIS — C9290 Myeloid leukemia, unspecified, not having achieved remission: Secondary | ICD-10-CM

## 2014-11-12 DIAGNOSIS — C9211 Chronic myeloid leukemia, BCR/ABL-positive, in remission: Secondary | ICD-10-CM | POA: Diagnosis present

## 2014-11-12 DIAGNOSIS — R197 Diarrhea, unspecified: Secondary | ICD-10-CM | POA: Diagnosis not present

## 2014-11-12 DIAGNOSIS — C9210 Chronic myeloid leukemia, BCR/ABL-positive, not having achieved remission: Secondary | ICD-10-CM

## 2014-11-12 LAB — COMPREHENSIVE METABOLIC PANEL (CC13)
ALBUMIN: 3.4 g/dL — AB (ref 3.5–5.0)
ALT: 18 U/L (ref 0–55)
AST: 20 U/L (ref 5–34)
Alkaline Phosphatase: 83 U/L (ref 40–150)
Anion Gap: 13 mEq/L — ABNORMAL HIGH (ref 3–11)
BUN: 17.3 mg/dL (ref 7.0–26.0)
CALCIUM: 9.6 mg/dL (ref 8.4–10.4)
CHLORIDE: 103 meq/L (ref 98–109)
CO2: 26 mEq/L (ref 22–29)
Creatinine: 1.4 mg/dL — ABNORMAL HIGH (ref 0.7–1.3)
EGFR: 62 mL/min/{1.73_m2} — ABNORMAL LOW (ref >90)
Glucose: 89 mg/dl (ref 70–140)
POTASSIUM: 3 meq/L — AB (ref 3.5–5.1)
SODIUM: 143 meq/L (ref 136–145)
Total Bilirubin: 0.34 mg/dL (ref 0.20–1.20)
Total Protein: 6 g/dL — ABNORMAL LOW (ref 6.4–8.3)

## 2014-11-12 LAB — CBC WITH DIFFERENTIAL/PLATELET
BASO%: 1 % (ref 0.0–2.0)
BASOS ABS: 0.1 10*3/uL (ref 0.0–0.1)
EOS%: 5.5 % (ref 0.0–7.0)
Eosinophils Absolute: 0.3 10*3/uL (ref 0.0–0.5)
HEMATOCRIT: 39.9 % (ref 38.4–49.9)
HEMOGLOBIN: 12.9 g/dL — AB (ref 13.0–17.1)
LYMPH#: 1.3 10*3/uL (ref 0.9–3.3)
LYMPH%: 25 % (ref 14.0–49.0)
MCH: 26.1 pg — AB (ref 27.2–33.4)
MCHC: 32.4 g/dL (ref 32.0–36.0)
MCV: 80.6 fL (ref 79.3–98.0)
MONO#: 0.4 10*3/uL (ref 0.1–0.9)
MONO%: 8.2 % (ref 0.0–14.0)
NEUT#: 3.2 10*3/uL (ref 1.5–6.5)
NEUT%: 60.3 % (ref 39.0–75.0)
Platelets: 267 10*3/uL (ref 140–400)
RBC: 4.96 10*6/uL (ref 4.20–5.82)
RDW: 16.1 % — AB (ref 11.0–14.6)
WBC: 5.3 10*3/uL (ref 4.0–10.3)

## 2014-11-12 MED ORDER — POTASSIUM CHLORIDE CRYS ER 20 MEQ PO TBCR: EXTENDED_RELEASE_TABLET | ORAL | Status: DC

## 2014-11-12 MED ORDER — PROCHLORPERAZINE MALEATE 5 MG PO TABS: 5.0000 mg | ORAL_TABLET | Freq: Four times a day (QID) | ORAL | Status: DC | PRN

## 2014-11-12 MED ORDER — DIPHENOXYLATE-ATROPINE 2.5-0.025 MG PO TABS: ORAL_TABLET | ORAL | Status: DC

## 2014-11-12 NOTE — Progress Notes (Signed)
[name] OFFICE PROGRESS NOTE   Diagnosis:  CML  INTERVAL HISTORY:   [name] returns as scheduled. He continues [place]. He intermittently has nausea with dry heaves early in the morning hours. He continues to have diarrhea which is controlled with Lomotil. Over the past weekend he had several black stools. At that time he was taking BC powder as well as Pepto-Bismol. Stools are now normal in color. No rash. Neck and left arm pain are better. He reports seeing his neurosurgeon recently.  Objective:  Vital signs in last 24 hours:  Blood pressure 146/96, pulse 80, temperature 98.5 F (36.9 C), temperature source Oral, resp. rate 18, height 5\' 9"  (1.753 m), weight 156 lb 14.4 oz (71.169 kg), SpO2 100 %.    HEENT: No thrush or ulcers. Resp: Lungs clear bilaterally. Cardio: Regular rate and rhythm. GI: Abdomen soft and nontender. No hepatomegaly. Vascular: No left leg edema.   Lab Results:  Lab Results  Component Value Date   WBC 5.3 11/12/2014   HGB 12.9* 11/12/2014   HCT 39.9 11/12/2014   MCV 80.6 11/12/2014   PLT 267 11/12/2014   NEUTROABS 3.2 11/12/2014   peripheral blood PCR improved on 09/29/2014-1.792%  Imaging:  No results found.  Medications: I have reviewed the patient's current medications.  Assessment/Plan: 1. Chronic myelogenous leukemia, diagnosed in January of 2009. He remains in hematologic remission. He is taking Gleevec at a dose of 200 mg daily. The peripheral blood PCR was markedly increased in May 2013, likely reflecting medical noncompliance. The peripheral blood PCR was significantly improved on 11/07/2011. The peripheral blood PCR was further improved on 01/31/2012. The peripheral blood PCR was increased on 04/27/2012; stable on 11/12/2012, improved on 02/12/2013; increased on 04/29/2013; increased on 07/10/2013; improved on 09/16/2013; improved on 10/28/2013; increased 02/17/2014; slightly improved 03/28/2014. Stable  04/29/2014. 2. 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 3. Status post C3-C4, C4-C5, anterior cervical diskectomy and fusion with allograft and plating 09/30/2010. 4. 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 5. Depression, maintained on Lexapro. 6. Diarrhea, ? related to chronic pancreatitis versus Gleevec. He takes Lomotil as needed.  7. Indurated facial skin lesion 11/20/2007 with a history of MRSA skin infection of the submental area in May 2008. The induration resolved with doxycycline. 8. Left knee arthroscopy 05/25/2007. 9. Postoperative left knee effusion/pain, likely related to gout. 10. History of gout. No longer taking allopurinol and colchicine 11. Chronic pancreatitis. 12. Status post right above-the-knee amputation. 13. MRSA infection of the submental area May 2008. 14. History of tobacco, alcohol, and cocaine use. 15. History of coronary artery disease. 16. "Shotty" lymphadenopathy of the neck, axilla, and left groin in 2009.  17. History of a microcytic anemia.  18. History of a right olecranon bursa lesion, ? gouty tophus. 19. 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 He previously took AndroGel. 20. History of anemia secondary to chronic disease and Gleevec therapy.  21. Anorexia-potentially related to [employer]. He reports Medicaid would not pay for Megace. He has gained weight 22. Chronic left knee and left foot pain. He takes hydrocodone as needed 23. Status post removal of a left knee screw 07/03/2012. 24. History of Hypokalemia. Likely related to diarrhea. 25. Status post left foot surgery. 26. Pulsatile fullness right neck. Evaluated by vascular surgery status post CT angiogram 03/07/2014 with no carotid artery aneurysm identified. 27. Pain, numbness,? Weakness of the left arm and hand. Improved. He reports recent neurosurgical evaluation.  Disposition: [name] appears unchanged. He remains in  hematologic remission from CML. He will continue Gleevec.  The black stools several days ago may have been related to Pepto-Bismol or BC powder. He understands to contact the office if the black stools recur.  He will return for a follow-up visit in 6 weeks. He will contact the office in the interim as outlined above or with any other problems.  Plan reviewed with Dr. [name].    [name] ANP/GNP-BC   11/12/2014  12:32 PM

## 2014-11-12 NOTE — Telephone Encounter (Signed)
Pt confirmed labs/ov per 07/06 POF, gave pt AVS and Calendar... KJ °

## 2014-11-13 ENCOUNTER — Encounter (HOSPITAL_COMMUNITY): Payer: Self-pay | Admitting: Emergency Medicine

## 2014-11-13 ENCOUNTER — Emergency Department (HOSPITAL_COMMUNITY)
Admission: EM | Admit: 2014-11-13 | Discharge: 2014-11-14 | Disposition: A | Discharge status: Home / Self Care | Payer: Medicare Other | Attending: Emergency Medicine | Admitting: Emergency Medicine

## 2014-11-13 ENCOUNTER — Other Ambulatory Visit: Payer: Self-pay

## 2014-11-13 DIAGNOSIS — M1990 Unspecified osteoarthritis, unspecified site: Secondary | ICD-10-CM | POA: Diagnosis not present

## 2014-11-13 DIAGNOSIS — Z7902 Long term (current) use of antithrombotics/antiplatelets: Secondary | ICD-10-CM | POA: Diagnosis not present

## 2014-11-13 DIAGNOSIS — T424X1A Poisoning by benzodiazepines, accidental (unintentional), initial encounter: Secondary | ICD-10-CM | POA: Diagnosis not present

## 2014-11-13 DIAGNOSIS — I252 Old myocardial infarction: Secondary | ICD-10-CM | POA: Insufficient documentation

## 2014-11-13 DIAGNOSIS — Z87828 Personal history of other (healed) physical injury and trauma: Secondary | ICD-10-CM | POA: Insufficient documentation

## 2014-11-13 DIAGNOSIS — Z859 Personal history of malignant neoplasm, unspecified: Secondary | ICD-10-CM | POA: Insufficient documentation

## 2014-11-13 DIAGNOSIS — Z720 Tobacco use: Secondary | ICD-10-CM | POA: Diagnosis not present

## 2014-11-13 DIAGNOSIS — I10 Essential (primary) hypertension: Secondary | ICD-10-CM | POA: Insufficient documentation

## 2014-11-13 DIAGNOSIS — M109 Gout, unspecified: Secondary | ICD-10-CM | POA: Diagnosis not present

## 2014-11-13 DIAGNOSIS — Z79899 Other long term (current) drug therapy: Secondary | ICD-10-CM | POA: Diagnosis not present

## 2014-11-13 LAB — RAPID URINE DRUG SCREEN, HOSP PERFORMED
Amphetamines: NOT DETECTED
Barbiturates: NOT DETECTED
Benzodiazepines: POSITIVE — AB
Cocaine: NOT DETECTED
Opiates: NOT DETECTED
Tetrahydrocannabinol: NOT DETECTED

## 2014-11-13 LAB — I-STAT CHEM 8, ED
BUN: 22 mg/dL — ABNORMAL HIGH (ref 6–20)
CALCIUM ION: 1.25 mmol/L (ref 1.13–1.30)
CHLORIDE: 98 mmol/L — AB (ref 101–111)
Creatinine, Ser: 1.3 mg/dL — ABNORMAL HIGH (ref 0.61–1.24)
Glucose, Bld: 102 mg/dL — ABNORMAL HIGH (ref 65–99)
HEMATOCRIT: 39 % (ref 39.0–52.0)
Hemoglobin: 13.3 g/dL (ref 13.0–17.0)
Potassium: 3.1 mmol/L — ABNORMAL LOW (ref 3.5–5.1)
SODIUM: 140 mmol/L (ref 135–145)
TCO2: 29 mmol/L (ref 0–100)

## 2014-11-13 LAB — COMPREHENSIVE METABOLIC PANEL
ALT: 16 U/L — ABNORMAL LOW (ref 17–63)
ANION GAP: 10 (ref 5–15)
AST: 20 U/L (ref 15–41)
Albumin: 3 g/dL — ABNORMAL LOW (ref 3.5–5.0)
Alkaline Phosphatase: 73 U/L (ref 38–126)
BUN: 19 mg/dL (ref 6–20)
CO2: 29 mmol/L (ref 22–32)
Calcium: 9.3 mg/dL (ref 8.9–10.3)
Chloride: 101 mmol/L (ref 101–111)
Creatinine, Ser: 1.28 mg/dL — ABNORMAL HIGH (ref 0.61–1.24)
GFR calc non Af Amer: 55 mL/min — ABNORMAL LOW (ref >60)
GLUCOSE: 105 mg/dL — AB (ref 65–99)
POTASSIUM: 3.1 mmol/L — AB (ref 3.5–5.1)
SODIUM: 140 mmol/L (ref 135–145)
TOTAL PROTEIN: 5.4 g/dL — AB (ref 6.5–8.1)
Total Bilirubin: 0.5 mg/dL (ref 0.3–1.2)

## 2014-11-13 LAB — URINALYSIS, ROUTINE W REFLEX MICROSCOPIC
Bilirubin Urine: NEGATIVE
Glucose, UA: NEGATIVE mg/dL
Hgb urine dipstick: NEGATIVE
Ketones, ur: NEGATIVE mg/dL
Leukocytes, UA: NEGATIVE
Nitrite: NEGATIVE
Protein, ur: 100 mg/dL — AB
Specific Gravity, Urine: 1.01 (ref 1.005–1.030)
Urobilinogen, UA: 0.2 mg/dL (ref 0.0–1.0)
pH: 6 (ref 5.0–8.0)

## 2014-11-13 LAB — URINE MICROSCOPIC-ADD ON

## 2014-11-13 LAB — ACETAMINOPHEN LEVEL: Acetaminophen (Tylenol), Serum: <10 ug/mL — ABNORMAL LOW (ref 10–30)

## 2014-11-13 LAB — SALICYLATE LEVEL

## 2014-11-13 LAB — ETHANOL: Alcohol, Ethyl (B): <5 mg/dL (ref <5)

## 2014-11-13 LAB — LIPASE, BLOOD: LIPASE: 27 U/L (ref 22–51)

## 2014-11-13 MED ORDER — POTASSIUM CHLORIDE CRYS ER 20 MEQ PO TBCR: 40.0000 meq | EXTENDED_RELEASE_TABLET | Freq: Once | ORAL | Status: DC

## 2014-11-13 MED ORDER — SODIUM CHLORIDE 0.9 % IV BOLUS (SEPSIS)
1000.0000 mL | Freq: Once | INTRAVENOUS | Status: AC
  Administered 2014-11-13: 1000 mL via INTRAVENOUS

## 2014-11-13 NOTE — ED Notes (Signed)
Poison control recommendation: Keep patient on monitor, draw tylenol level, liver function. Hydrate patient. Watch patient for minimum 6 hours or patient baseline.   S/S of valium overdose CNS depression Respiratory depression Hypotension

## 2014-11-13 NOTE — ED Notes (Signed)
Per [facility], patients family came over 53 and found patient "out of it", family members found bag of valium pills with 6 pills in it, pt took "5 or more valium". At 1800, patients mental status declined, family found only one pill left. Patient takes valium to help him sleep. Pt denies SI/HI. Pt states he took the valium to help with the pain, pt hx of leukemia and has chronic pain all over. Pt is very drowsy. Pupils equal and reative.

## 2014-11-14 NOTE — ED Notes (Signed)
Pt alert and oriented, asking to use bathroom. MD notified. Plan to D/C

## 2014-11-14 NOTE — Discharge Instructions (Signed)
Accidental Overdose °A drug overdose occurs when a chemical substance (drug or medication) is used in amounts large enough to overcome a person. This may result in severe illness or death. This is a type of poisoning. Accidental overdoses of medications or other substances come from a variety of reasons. When this happens accidentally, it is often because the person taking the substance does not know enough about what they have taken. Drugs which commonly cause overdose deaths are alcohol, psychotropic medications (medications which affect the mind), pain medications, illegal drugs (street drugs) such as cocaine and heroin, and multiple drugs taken at the same time. It may result from careless behavior (such as over-indulging at a party). Other causes of overdose may include multiple drug use, a lapse in memory, or drug use after a period of no drug use.  °Sometimes overdosing occurs because a person cannot remember if they have taken their medication.  °A common unintentional overdose in young children involves multi-vitamins containing iron. Iron is a part of the hemoglobin molecule in blood. It is used to transport oxygen to living cells. When taken in small amounts, iron allows the body to restock hemoglobin. In large amounts, it causes problems in the body. If this overdose is not treated, it can lead to death. °Never take medicines that show signs of tampering or do not seem quite right. Never take medicines in the dark or in poor lighting. Read the label and check each dose of medicine before you take it. When adults are poisoned, it happens most often through carelessness or lack of information. Taking medicines in the dark or taking medicine prescribed for someone else to treat the same type of problem is a dangerous practice. °SYMPTOMS  °Symptoms of overdose depend on the medication and amount taken. They can vary from over-activity with stimulant over-dosage, to sleepiness from depressants such as  alcohol, narcotics and tranquilizers. Confusion, dizziness, nausea and vomiting may be present. If problems are severe enough coma and death may result. °DIAGNOSIS  °Diagnosis and management are generally straightforward if the drug is known. Otherwise it is more difficult. At times, certain symptoms and signs exhibited by the patient, or blood tests, can reveal the drug in question.  °TREATMENT  °In an emergency department, most patients can be treated with supportive measures. Antidotes may be available if there has been an overdose of opioids or benzodiazepines. A rapid improvement will often occur if this is the cause of overdose. °At home or away from medical care: °· There may be no immediate problems or warning signs in children. °· Not everything works well in all cases of poisoning. °· Take immediate action. Poisons may act quickly. °· If you think someone has swallowed medicine or a household product, and the person is unconscious, having seizures (convulsions), or is not breathing, immediately call for an ambulance. °IF a person is conscious and appears to be doing OK but has swallowed a poison: °· Do not wait to see what effect the poison will have. Immediately call a poison control center (listed in the white pages of your telephone book under "Poison Control" or inside the front cover with other emergency numbers). Some poison control centers have TTY capability for the deaf. Check with your local center if you or someone in your family requires this service. °· Keep the container so you can read the label on the product for ingredients. °· Describe what, when, and how much was taken and the age and condition of the person poisoned.   Inform them if the person is vomiting, choking, drowsy, shows a change in color or temperature of skin, is conscious or unconscious, or is convulsing.  Do not cause vomiting unless instructed by medical personnel. Do not induce vomiting or force liquids into a person who  is convulsing, unconscious, or very drowsy. Stay calm and in control.   Activated charcoal also is sometimes used in certain types of poisoning and you may wish to add a supply to your emergency medicines. It is available without a prescription. Call a poison control center before using this medication. PREVENTION  Thousands of children die every year from unintentional poisoning. This may be from household chemicals, poisoning from carbon monoxide in a car, taking their parent's medications, or simply taking a few iron pills or vitamins with iron. Poisoning comes from unexpected sources.  Store medicines out of the sight and reach of children, preferably in a locked cabinet. Do not keep medications in a food cabinet. Always store your medicines in a secure place. Get rid of expired medications.  If you have children living with you or have them as occasional guests, you should have child-resistant caps on your medicine containers. Keep everything out of reach. Child proof your home.  If you are called to the telephone or to answer the door while you are taking a medicine, take the container with you or put the medicine out of the reach of small children.  Do not take your medication in front of children. Do not tell your child how good a medication is and how good it is for them. They may get the idea it is more of a treat.  If you are an adult and have accidentally taken an overdose, you need to consider how this happened and what can be done to prevent it from happening again. If this was from a street drug or alcohol, determine if there is a problem that needs addressing. If you are not sure a problems exists, it is easy to talk to a professional and ask them if they think you have a problem. It is better to handle this problem in this way before it happens again and has a much worse consequence. Document Released: 07/09/2004 Document Revised: 07/18/2011 Document Reviewed: 12/15/2008 [facility]  Patient Information 2015 [place], [place]. This information is not intended to replace advice given to you by your health care provider. Make sure you discuss any questions you have with your health care provider.
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
## 2014-12-25 ENCOUNTER — Other Ambulatory Visit (HOSPITAL_BASED_OUTPATIENT_CLINIC_OR_DEPARTMENT_OTHER): Payer: Medicare Other

## 2014-12-25 ENCOUNTER — Ambulatory Visit (HOSPITAL_BASED_OUTPATIENT_CLINIC_OR_DEPARTMENT_OTHER): Payer: Medicare Other | Admitting: Oncology

## 2014-12-25 VITALS — BP 154/99 | HR 82 | Temp 98.3°F | Resp 18 | Ht 69.0 in | Wt 159.0 lb

## 2014-12-25 DIAGNOSIS — C9211 Chronic myeloid leukemia, BCR/ABL-positive, in remission: Secondary | ICD-10-CM

## 2014-12-25 DIAGNOSIS — C9210 Chronic myeloid leukemia, BCR/ABL-positive, not having achieved remission: Secondary | ICD-10-CM

## 2014-12-25 LAB — CBC WITH DIFFERENTIAL/PLATELET
BASO%: 0.8 % (ref 0.0–2.0)
BASOS ABS: 0 10*3/uL (ref 0.0–0.1)
EOS ABS: 0.4 10*3/uL (ref 0.0–0.5)
EOS%: 8.7 % — ABNORMAL HIGH (ref 0.0–7.0)
HEMATOCRIT: 43.3 % (ref 38.4–49.9)
HEMOGLOBIN: 13.9 g/dL (ref 13.0–17.1)
LYMPH#: 1 10*3/uL (ref 0.9–3.3)
LYMPH%: 22.6 % (ref 14.0–49.0)
MCH: 26.5 pg — AB (ref 27.2–33.4)
MCHC: 32.1 g/dL (ref 32.0–36.0)
MCV: 82.7 fL (ref 79.3–98.0)
MONO#: 0.4 10*3/uL (ref 0.1–0.9)
MONO%: 8 % (ref 0.0–14.0)
NEUT#: 2.7 10*3/uL (ref 1.5–6.5)
NEUT%: 59.9 % (ref 39.0–75.0)
PLATELETS: 180 10*3/uL (ref 140–400)
RBC: 5.24 10*6/uL (ref 4.20–5.82)
RDW: 17.1 % — AB (ref 11.0–14.6)
WBC: 4.6 10*3/uL (ref 4.0–10.3)

## 2014-12-25 LAB — COMPREHENSIVE METABOLIC PANEL (CC13)
ALT: 15 U/L (ref 0–55)
AST: 18 U/L (ref 5–34)
Albumin: 3.5 g/dL (ref 3.5–5.0)
Alkaline Phosphatase: 98 U/L (ref 40–150)
Anion Gap: 10 mEq/L (ref 3–11)
BUN: 11.8 mg/dL (ref 7.0–26.0)
CHLORIDE: 110 meq/L — AB (ref 98–109)
CO2: 23 mEq/L (ref 22–29)
CREATININE: 1.1 mg/dL (ref 0.7–1.3)
Calcium: 9.4 mg/dL (ref 8.4–10.4)
EGFR: 79 mL/min/{1.73_m2} — ABNORMAL LOW (ref >90)
GLUCOSE: 105 mg/dL (ref 70–140)
Potassium: 3.5 mEq/L (ref 3.5–5.1)
Sodium: 143 mEq/L (ref 136–145)
Total Bilirubin: 0.3 mg/dL (ref 0.20–1.20)
Total Protein: 6.1 g/dL — ABNORMAL LOW (ref 6.4–8.3)

## 2014-12-25 NOTE — Progress Notes (Signed)
[place] OFFICE PROGRESS NOTE   Diagnosis: CML  INTERVAL HISTORY:   [name] returns as scheduled. He continues [place]. He reports missing Gleevec for 3 days approximate 2 weeks ago when he was out of town. He continues to have diarrhea, controlled with his current medical regimen. He reports anorexia. No abdominal pain. He has not taken blood pressure medicine for several days. His blood pressure medication refill is at the pharmacy.  He was seen in the emergency room 11/14/2014 with a Valium overdose. Objective:  Vital signs in last 24 hours:  Blood pressure 154/99, pulse 82, temperature 98.3 F (36.8 C), temperature source Oral, resp. rate 18, height 5\' 9"  (1.753 m), weight 159 lb (72.122 kg), SpO2 100 %.    HEENT: No thrush or ulcers Lymphatics: No cervical or supra-clavicular nodes Resp: Lungs clear bilaterally Cardio: Regular rate and rhythm GI: No hepatomegaly, no splenomegaly Vascular: No left leg edema   Lab Results:  Lab Results  Component Value Date   WBC 4.6 12/25/2014   HGB 13.9 12/25/2014   HCT 43.3 12/25/2014   MCV 82.7 12/25/2014   PLT 180 12/25/2014   NEUTROABS 2.7 12/25/2014   BCR/ABL on 11/12/2014: 1.288%  Medications: I have reviewed the patient's current medications.  Assessment/Plan: 1. Chronic myelogenous leukemia, diagnosed in January of 2009. He remains in hematologic remission. He is taking Gleevec at a dose of 200 mg daily. The peripheral blood PCR was markedly increased in May 2013, likely reflecting medical noncompliance. The peripheral blood PCR was significantly improved on 11/07/2011. The peripheral blood PCR was further improved on 01/31/2012. The peripheral blood PCR was increased on 04/27/2012; stable on 11/12/2012, improved on 02/12/2013; increased on 04/29/2013; increased on 07/10/2013; improved on 09/16/2013; improved on 10/28/2013; increased 02/17/2014; slightly improved 03/28/2014. Stable 04/29/2014. 2. Status  post left knee replacement 05/14/2010. 3. Status post C3-C4, C4-C5, anterior cervical diskectomy and fusion with allograft and plating 09/30/2010. 4. 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 5. Depression, maintained on Lexapro. 6. Diarrhea, ? related to chronic pancreatitis versus Gleevec. He takes Lomotil as needed.  7. Indurated facial skin lesion 11/20/2007 with a history of MRSA skin infection of the submental area in May 2008. The induration resolved with doxycycline. 8. Left knee arthroscopy 05/25/2007. 9. Postoperative left knee effusion/pain, likely related to gout. 10. History of gout.  11. Chronic pancreatitis. 12. Status post right above-the-knee amputation. 13. MRSA infection of the submental area May 2008. 14. History of tobacco, alcohol, and cocaine use. 15. History of coronary artery disease. 16. "Shotty" lymphadenopathy of the neck, axilla, and left groin in 2009.  17. History of a microcytic anemia.  18. History of a right olecranon bursa lesion, ? gouty tophus. 19. 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 He previously took AndroGel. 20. History of anemia secondary to chronic disease and Gleevec therapy.  21. Anorexia-potentially related to [place]. He reports Medicaid would not pay for Megace. He has gained weight 22. Chronic left knee and left foot pain. He takes hydrocodone as needed 23. Status post removal of a left knee screw 07/03/2012. 24. History of Hypokalemia. Likely related to diarrhea. 25. Status post left foot surgery. 26. Pulsatile fullness right neck. Evaluated by vascular surgery status post CT angiogram 03/07/2014 with no carotid artery aneurysm identified.    Disposition:  [name] is stable from a hematologic standpoint. The peripheral blood PCR was improved last month. We will follow-up on the PCR  from today. He will return for an office and lab visit in 6 weeks. He will continue  Gleevec.  [name], MD  12/25/2014  2:12 PM

## 2014-12-26 ENCOUNTER — Telehealth: Payer: Self-pay | Admitting: Nurse Practitioner

## 2014-12-26 NOTE — Telephone Encounter (Signed)
Spoke with patient and he is aware of his 6 week appointment

## 2015-02-05 ENCOUNTER — Other Ambulatory Visit (HOSPITAL_BASED_OUTPATIENT_CLINIC_OR_DEPARTMENT_OTHER): Payer: Medicare Other

## 2015-02-05 ENCOUNTER — Telehealth: Payer: Self-pay | Admitting: Nurse Practitioner

## 2015-02-05 ENCOUNTER — Ambulatory Visit (HOSPITAL_BASED_OUTPATIENT_CLINIC_OR_DEPARTMENT_OTHER): Payer: Medicare Other | Admitting: Nurse Practitioner

## 2015-02-05 VITALS — BP 132/88 | HR 83 | Temp 98.6°F | Resp 17 | Ht 69.0 in | Wt 155.6 lb

## 2015-02-05 DIAGNOSIS — C9290 Myeloid leukemia, unspecified, not having achieved remission: Secondary | ICD-10-CM

## 2015-02-05 DIAGNOSIS — C9210 Chronic myeloid leukemia, BCR/ABL-positive, not having achieved remission: Secondary | ICD-10-CM

## 2015-02-05 LAB — CBC WITH DIFFERENTIAL/PLATELET
BASO%: 0.7 % (ref 0.0–2.0)
BASOS ABS: 0 10*3/uL (ref 0.0–0.1)
EOS ABS: 0.4 10*3/uL (ref 0.0–0.5)
EOS%: 8.4 % — ABNORMAL HIGH (ref 0.0–7.0)
HCT: 39.8 % (ref 38.4–49.9)
HGB: 12.9 g/dL — ABNORMAL LOW (ref 13.0–17.1)
LYMPH%: 24.7 % (ref 14.0–49.0)
MCH: 26.3 pg — AB (ref 27.2–33.4)
MCHC: 32.4 g/dL (ref 32.0–36.0)
MCV: 81.2 fL (ref 79.3–98.0)
MONO#: 0.4 10*3/uL (ref 0.1–0.9)
MONO%: 9.1 % (ref 0.0–14.0)
NEUT%: 57.1 % (ref 39.0–75.0)
NEUTROS ABS: 2.7 10*3/uL (ref 1.5–6.5)
Platelets: 158 10*3/uL (ref 140–400)
RBC: 4.9 10*6/uL (ref 4.20–5.82)
RDW: 15.9 % — ABNORMAL HIGH (ref 11.0–14.6)
WBC: 4.8 10*3/uL (ref 4.0–10.3)
lymph#: 1.2 10*3/uL (ref 0.9–3.3)

## 2015-02-05 LAB — COMPREHENSIVE METABOLIC PANEL (CC13)
ALT: 15 U/L (ref 0–55)
ANION GAP: 8 meq/L (ref 3–11)
AST: 19 U/L (ref 5–34)
Albumin: 3.5 g/dL (ref 3.5–5.0)
Alkaline Phosphatase: 85 U/L (ref 40–150)
BILIRUBIN TOTAL: 0.36 mg/dL (ref 0.20–1.20)
BUN: 13.8 mg/dL (ref 7.0–26.0)
CO2: 31 meq/L — AB (ref 22–29)
Calcium: 9.2 mg/dL (ref 8.4–10.4)
Chloride: 104 mEq/L (ref 98–109)
Creatinine: 1.2 mg/dL (ref 0.7–1.3)
EGFR: 69 mL/min/{1.73_m2} — ABNORMAL LOW (ref >90)
Glucose: 105 mg/dl (ref 70–140)
POTASSIUM: 3.2 meq/L — AB (ref 3.5–5.1)
SODIUM: 143 meq/L (ref 136–145)
TOTAL PROTEIN: 5.8 g/dL — AB (ref 6.4–8.3)

## 2015-02-05 MED ORDER — DIPHENOXYLATE-ATROPINE 2.5-0.025 MG PO TABS: ORAL_TABLET | ORAL | Status: DC

## 2015-02-05 NOTE — Telephone Encounter (Signed)
As per Owens Shark call placed to [name] to inform him that his potassium is mildly decreased, It is likely from the diarrhea and to make sure he is taking his potassium. Pt. Wasn't available, Left message for her to call the cancer center.

## 2015-02-05 NOTE — Telephone Encounter (Signed)
-----   Message from Owens Shark, NP sent at 02/05/2015 12:46 PM EDT ----- Please let him know potassium is mildly decreased. This is likely due to diarrhea. Please make sure he is taking potassium as prescribed.

## 2015-02-05 NOTE — Telephone Encounter (Signed)
per pof to sch pt appt-per pt req to mail avs-mailed

## 2015-02-05 NOTE — Progress Notes (Signed)
[place] OFFICE PROGRESS NOTE   Diagnosis:  CML  INTERVAL HISTORY:   [name] returns as scheduled. He continues [place]. He thinks he has missed a few doses over the past month. He has mild intermittent nausea, typically in the mornings. He continues to have intermittent loose stools. He takes Lomotil and Imodium as needed. He continues to smoke, estimating 1 pack per day.  Objective:  Vital signs in last 24 hours:  Blood pressure 132/88, pulse 83, temperature 98.6 F (37 C), temperature source Oral, resp. rate 17, height 5\' 9"  (1.753 m), weight 155 lb 9.6 oz (70.58 kg), SpO2 100 %.    HEENT: No thrush or ulcers. Resp: Lungs clear bilaterally. Cardio: Regular rate and rhythm. GI: Abdomen soft and nontender. No organomegaly. Vascular: No left leg edema.   Lab Results:  Lab Results  Component Value Date   WBC 4.8 02/05/2015   HGB 12.9* 02/05/2015   HCT 39.8 02/05/2015   MCV 81.2 02/05/2015   PLT 158 02/05/2015   NEUTROABS 2.7 02/05/2015    Imaging:  No results found.  Medications: I have reviewed the patient's current medications.  Assessment/Plan: 1. Chronic myelogenous leukemia, diagnosed in January of 2009. He remains in hematologic remission. He is taking Gleevec at a dose of 200 mg daily. The peripheral blood PCR was markedly increased in May 2013, likely reflecting medical noncompliance. The peripheral blood PCR was significantly improved on 11/07/2011. The peripheral blood PCR was further improved on 01/31/2012. The peripheral blood PCR was increased on 04/27/2012; stable on 11/12/2012, improved on 02/12/2013; increased on 04/29/2013; increased on 07/10/2013; improved on 09/16/2013; improved on 10/28/2013; increased 02/17/2014; slightly improved 03/28/2014. Stable 04/29/2014. Increased 05/30/2014. Improved 07/26/2014. Improved 09/30/2014. Improved 11/13/2014. Slightly increased 12/25/2014. 2. Status post left knee replacement  05/14/2010. 3. Status post C3-C4, C4-C5, anterior cervical diskectomy and fusion with allograft and plating 09/30/2010. 4. 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 5. Depression, maintained on Lexapro. 6. Diarrhea, ? related to chronic pancreatitis versus Gleevec. He takes Lomotil as needed.  7. Indurated facial skin lesion 11/20/2007 with a history of MRSA skin infection of the submental area in May 2008. The induration resolved with doxycycline. 8. Left knee arthroscopy 05/25/2007. 9. Postoperative left knee effusion/pain, likely related to gout. 10. History of gout.  11. Chronic pancreatitis. 12. Status post right above-the-knee amputation. 13. MRSA infection of the submental area May 2008. 14. History of tobacco, alcohol, and cocaine use. 15. History of coronary artery disease. 16. "Shotty" lymphadenopathy of the neck, axilla, and left groin in 2009.  17. History of a microcytic anemia.  18. History of a right olecranon bursa lesion, ? gouty tophus. 19. 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 He previously took AndroGel. 20. History of anemia secondary to chronic disease and Gleevec therapy.  21. Anorexia-potentially related to [place]. He reports Medicaid would not pay for Megace.  22. Chronic left knee and left foot pain. He takes hydrocodone as needed 23. Status post removal of a left knee screw 07/03/2012. 24. History of Hypokalemia. Likely related to diarrhea. 25. Status post left foot surgery. 26. Pulsatile fullness right neck. Evaluated by vascular surgery status post CT angiogram 03/07/2014 with no carotid artery aneurysm identified.   Disposition: [name] remains stable from a hematologic standpoint. He will continue Gleevec. The peripheral blood PCR was slightly increased last month. We will follow-up on the PCR from today. He will return for a follow-up visit/labs  in 6 weeks. He will contact the office  in the interim with any problems.  We discussed the importance of smoking cessation. He declined further information. He plans to discuss with his PCP.  Plan reviewed with Dr. Benay [name].  [name] ANP/GNP-BC   02/05/2015  12:21 PM

## 2015-02-06 MED ORDER — POTASSIUM CHLORIDE CRYS ER 20 MEQ PO TBCR: EXTENDED_RELEASE_TABLET | ORAL | Status: DC

## 2015-02-06 NOTE — Telephone Encounter (Signed)
Pt returned call, stated he's been taking one potassium daily. Requests a refill. Rx sent to [facility].

## 2015-02-06 NOTE — Addendum Note (Signed)
Addended by: Brien Few on: 02/06/2015 10:19 AM   Modules accepted: Orders

## 2015-02-07 DIAGNOSIS — L0390 Cellulitis, unspecified: Secondary | ICD-10-CM

## 2015-02-07 HISTORY — DX: Cellulitis, unspecified: L03.90

## 2015-02-15 ENCOUNTER — Encounter (HOSPITAL_COMMUNITY): Payer: Self-pay

## 2015-02-15 ENCOUNTER — Emergency Department (HOSPITAL_COMMUNITY): Payer: Medicare Other

## 2015-02-15 ENCOUNTER — Inpatient Hospital Stay (HOSPITAL_COMMUNITY)
Admission: EM | Admit: 2015-02-15 | Discharge: 2015-02-17 | DRG: 158 | Disposition: A | Discharge status: Home / Self Care | Payer: Medicare Other | Attending: Internal Medicine | Admitting: Internal Medicine

## 2015-02-15 DIAGNOSIS — Z720 Tobacco use: Secondary | ICD-10-CM | POA: Diagnosis present

## 2015-02-15 DIAGNOSIS — L0390 Cellulitis, unspecified: Secondary | ICD-10-CM | POA: Diagnosis present

## 2015-02-15 DIAGNOSIS — E876 Hypokalemia: Secondary | ICD-10-CM | POA: Diagnosis present

## 2015-02-15 DIAGNOSIS — K122 Cellulitis and abscess of mouth: Secondary | ICD-10-CM | POA: Diagnosis present

## 2015-02-15 DIAGNOSIS — K029 Dental caries, unspecified: Secondary | ICD-10-CM | POA: Diagnosis present

## 2015-02-15 DIAGNOSIS — C9210 Chronic myeloid leukemia, BCR/ABL-positive, not having achieved remission: Secondary | ICD-10-CM | POA: Diagnosis present

## 2015-02-15 DIAGNOSIS — C9211 Chronic myeloid leukemia, BCR/ABL-positive, in remission: Secondary | ICD-10-CM | POA: Diagnosis present

## 2015-02-15 DIAGNOSIS — F17210 Nicotine dependence, cigarettes, uncomplicated: Secondary | ICD-10-CM | POA: Diagnosis present

## 2015-02-15 DIAGNOSIS — I252 Old myocardial infarction: Secondary | ICD-10-CM

## 2015-02-15 DIAGNOSIS — Z96652 Presence of left artificial knee joint: Secondary | ICD-10-CM | POA: Diagnosis present

## 2015-02-15 DIAGNOSIS — K089 Disorder of teeth and supporting structures, unspecified: Secondary | ICD-10-CM | POA: Diagnosis not present

## 2015-02-15 DIAGNOSIS — L0291 Cutaneous abscess, unspecified: Secondary | ICD-10-CM

## 2015-02-15 DIAGNOSIS — M109 Gout, unspecified: Secondary | ICD-10-CM | POA: Diagnosis present

## 2015-02-15 DIAGNOSIS — I160 Hypertensive urgency: Secondary | ICD-10-CM

## 2015-02-15 DIAGNOSIS — L03211 Cellulitis of face: Secondary | ICD-10-CM | POA: Diagnosis not present

## 2015-02-15 DIAGNOSIS — Z79899 Other long term (current) drug therapy: Secondary | ICD-10-CM | POA: Diagnosis not present

## 2015-02-15 DIAGNOSIS — Z9861 Coronary angioplasty status: Secondary | ICD-10-CM | POA: Diagnosis not present

## 2015-02-15 DIAGNOSIS — I10 Essential (primary) hypertension: Secondary | ICD-10-CM | POA: Diagnosis present

## 2015-02-15 DIAGNOSIS — Z89611 Acquired absence of right leg above knee: Secondary | ICD-10-CM | POA: Diagnosis not present

## 2015-02-15 DIAGNOSIS — K130 Diseases of lips: Secondary | ICD-10-CM

## 2015-02-15 DIAGNOSIS — E785 Hyperlipidemia, unspecified: Secondary | ICD-10-CM | POA: Diagnosis present

## 2015-02-15 HISTORY — DX: Anxiety disorder, unspecified: F41.9

## 2015-02-15 HISTORY — DX: Cellulitis, unspecified: L03.90

## 2015-02-15 LAB — BASIC METABOLIC PANEL
Anion gap: 11 (ref 5–15)
BUN: 14 mg/dL (ref 6–20)
CO2: 26 mmol/L (ref 22–32)
Calcium: 8.9 mg/dL (ref 8.9–10.3)
Chloride: 102 mmol/L (ref 101–111)
Creatinine, Ser: 1.13 mg/dL (ref 0.61–1.24)
GFR calc non Af Amer: >60 mL/min (ref >60)
Glucose, Bld: 113 mg/dL — ABNORMAL HIGH (ref 65–99)
POTASSIUM: 3.3 mmol/L — AB (ref 3.5–5.1)
SODIUM: 139 mmol/L (ref 135–145)

## 2015-02-15 LAB — CBC WITH DIFFERENTIAL/PLATELET
BASOS ABS: 0 10*3/uL (ref 0.0–0.1)
Basophils Relative: 0 %
Eosinophils Absolute: 0.5 10*3/uL (ref 0.0–0.7)
Eosinophils Relative: 7 %
HCT: 39.5 % (ref 39.0–52.0)
Hemoglobin: 13 g/dL (ref 13.0–17.0)
LYMPHS PCT: 15 %
Lymphs Abs: 1.1 10*3/uL (ref 0.7–4.0)
MCH: 26.7 pg (ref 26.0–34.0)
MCHC: 32.9 g/dL (ref 30.0–36.0)
MCV: 81.1 fL (ref 78.0–100.0)
Monocytes Absolute: 0.7 10*3/uL (ref 0.1–1.0)
Monocytes Relative: 10 %
NEUTROS PCT: 68 %
Neutro Abs: 5.1 10*3/uL (ref 1.7–7.7)
Platelets: 181 10*3/uL (ref 150–400)
RBC: 4.87 MIL/uL (ref 4.22–5.81)
RDW: 16.4 % — ABNORMAL HIGH (ref 11.5–15.5)
WBC: 7.4 10*3/uL (ref 4.0–10.5)

## 2015-02-15 MED ORDER — IMATINIB MESYLATE 100 MG PO TABS
200.0000 mg | ORAL_TABLET | Freq: Every day | ORAL | Status: DC
  Administered 2015-02-15 – 2015-02-17 (×3): 200 mg via ORAL
  Filled 2015-02-15 (×4): qty 2

## 2015-02-15 MED ORDER — HYDROCODONE-ACETAMINOPHEN 5-325 MG PO TABS
1.0000 | ORAL_TABLET | ORAL | Status: DC | PRN
  Administered 2015-02-15 – 2015-02-17 (×4): 2 via ORAL
  Filled 2015-02-15 (×4): qty 2

## 2015-02-15 MED ORDER — ONDANSETRON HCL 4 MG PO TABS
4.0000 mg | ORAL_TABLET | Freq: Four times a day (QID) | ORAL | Status: DC
  Administered 2015-02-15 – 2015-02-17 (×9): 4 mg via ORAL
  Filled 2015-02-15 (×10): qty 1

## 2015-02-15 MED ORDER — PROCHLORPERAZINE MALEATE 5 MG PO TABS
5.0000 mg | ORAL_TABLET | Freq: Four times a day (QID) | ORAL | Status: DC | PRN
  Filled 2015-02-15: qty 2

## 2015-02-15 MED ORDER — NICOTINE 21 MG/24HR TD PT24
21.0000 mg | MEDICATED_PATCH | Freq: Every day | TRANSDERMAL | Status: DC
  Administered 2015-02-15 – 2015-02-17 (×3): 21 mg via TRANSDERMAL
  Filled 2015-02-15 (×3): qty 1

## 2015-02-15 MED ORDER — ACETAMINOPHEN 325 MG PO TABS: 650.0000 mg | ORAL_TABLET | Freq: Four times a day (QID) | ORAL | Status: DC | PRN

## 2015-02-15 MED ORDER — POTASSIUM CHLORIDE CRYS ER 20 MEQ PO TBCR
20.0000 meq | EXTENDED_RELEASE_TABLET | Freq: Two times a day (BID) | ORAL | Status: DC
  Administered 2015-02-15 – 2015-02-17 (×5): 20 meq via ORAL
  Filled 2015-02-15 (×5): qty 1

## 2015-02-15 MED ORDER — LOSARTAN POTASSIUM-HCTZ 100-25 MG PO TABS: 1.0000 | ORAL_TABLET | Freq: Every day | ORAL | Status: DC

## 2015-02-15 MED ORDER — MORPHINE SULFATE (PF) 4 MG/ML IV SOLN
4.0000 mg | Freq: Once | INTRAVENOUS | Status: AC
  Administered 2015-02-15: 4 mg via INTRAVENOUS
  Filled 2015-02-15: qty 1

## 2015-02-15 MED ORDER — ONDANSETRON HCL 4 MG/2ML IJ SOLN: 4.0000 mg | Freq: Four times a day (QID) | INTRAMUSCULAR | Status: DC | PRN

## 2015-02-15 MED ORDER — DIPHENOXYLATE-ATROPINE 2.5-0.025 MG PO TABS: 1.0000 | ORAL_TABLET | Freq: Four times a day (QID) | ORAL | Status: DC | PRN

## 2015-02-15 MED ORDER — ONDANSETRON HCL 4 MG/2ML IJ SOLN: 4.0000 mg | Freq: Three times a day (TID) | INTRAMUSCULAR | Status: DC | PRN

## 2015-02-15 MED ORDER — HYDROMORPHONE HCL 1 MG/ML IJ SOLN: 1.0000 mg | INTRAMUSCULAR | Status: DC | PRN

## 2015-02-15 MED ORDER — ONDANSETRON HCL 4 MG PO TABS: 4.0000 mg | ORAL_TABLET | Freq: Four times a day (QID) | ORAL | Status: DC | PRN

## 2015-02-15 MED ORDER — ATORVASTATIN CALCIUM 10 MG PO TABS
20.0000 mg | ORAL_TABLET | Freq: Every day | ORAL | Status: DC
  Administered 2015-02-15: 20 mg via ORAL
  Filled 2015-02-15 (×2): qty 2

## 2015-02-15 MED ORDER — IMATINIB MESYLATE 100 MG PO TABS: 200.0000 mg | ORAL_TABLET | Freq: Every day | ORAL | Status: DC

## 2015-02-15 MED ORDER — POLYETHYLENE GLYCOL 3350 17 G PO PACK: 17.0000 g | PACK | Freq: Every day | ORAL | Status: DC | PRN

## 2015-02-15 MED ORDER — CLINDAMYCIN PHOSPHATE 600 MG/50ML IV SOLN
600.0000 mg | Freq: Once | INTRAVENOUS | Status: AC
  Administered 2015-02-15: 600 mg via INTRAVENOUS
  Filled 2015-02-15: qty 50

## 2015-02-15 MED ORDER — COLCHICINE 0.6 MG PO TABS
0.6000 mg | ORAL_TABLET | Freq: Every day | ORAL | Status: DC
  Administered 2015-02-15 – 2015-02-17 (×3): 0.6 mg via ORAL
  Filled 2015-02-15 (×3): qty 1

## 2015-02-15 MED ORDER — SODIUM CHLORIDE 0.9 % IJ SOLN
3.0000 mL | Freq: Two times a day (BID) | INTRAMUSCULAR | Status: DC
  Administered 2015-02-15 – 2015-02-16 (×3): 3 mL via INTRAVENOUS

## 2015-02-15 MED ORDER — HYDROCHLOROTHIAZIDE 25 MG PO TABS
25.0000 mg | ORAL_TABLET | Freq: Every day | ORAL | Status: DC
  Administered 2015-02-15 – 2015-02-17 (×3): 25 mg via ORAL
  Filled 2015-02-15 (×3): qty 1

## 2015-02-15 MED ORDER — SODIUM CHLORIDE 0.9 % IJ SOLN: 3.0000 mL | INTRAMUSCULAR | Status: DC | PRN

## 2015-02-15 MED ORDER — ALUM & MAG HYDROXIDE-SIMETH 200-200-20 MG/5ML PO SUSP: 30.0000 mL | Freq: Four times a day (QID) | ORAL | Status: DC | PRN

## 2015-02-15 MED ORDER — PANTOPRAZOLE SODIUM 40 MG PO TBEC
40.0000 mg | DELAYED_RELEASE_TABLET | Freq: Every day | ORAL | Status: DC
  Administered 2015-02-15 – 2015-02-17 (×3): 40 mg via ORAL
  Filled 2015-02-15 (×3): qty 1

## 2015-02-15 MED ORDER — AMLODIPINE BESYLATE 10 MG PO TABS
10.0000 mg | ORAL_TABLET | Freq: Every day | ORAL | Status: DC
  Administered 2015-02-15 – 2015-02-17 (×3): 10 mg via ORAL
  Filled 2015-02-15 (×3): qty 1

## 2015-02-15 MED ORDER — SODIUM CHLORIDE 0.9 % IV SOLN
3.0000 g | Freq: Three times a day (TID) | INTRAVENOUS | Status: DC
  Administered 2015-02-15 – 2015-02-16 (×4): 3 g via INTRAVENOUS
  Filled 2015-02-15 (×6): qty 3

## 2015-02-15 MED ORDER — SODIUM CHLORIDE 0.9 % IV SOLN: 250.0000 mL | INTRAVENOUS | Status: DC | PRN

## 2015-02-15 MED ORDER — LORAZEPAM 0.5 MG PO TABS: 0.5000 mg | ORAL_TABLET | Freq: Two times a day (BID) | ORAL | Status: DC | PRN

## 2015-02-15 MED ORDER — SIMVASTATIN 40 MG PO TABS: 40.0000 mg | ORAL_TABLET | Freq: Every day | ORAL | Status: DC

## 2015-02-15 MED ORDER — ALBUTEROL SULFATE (2.5 MG/3ML) 0.083% IN NEBU: 3.0000 mL | INHALATION_SOLUTION | Freq: Four times a day (QID) | RESPIRATORY_TRACT | Status: DC | PRN

## 2015-02-15 MED ORDER — OXYCODONE-ACETAMINOPHEN 5-325 MG PO TABS
1.0000 | ORAL_TABLET | Freq: Four times a day (QID) | ORAL | Status: DC | PRN
  Administered 2015-02-15: 1 via ORAL
  Administered 2015-02-16: 2 via ORAL
  Filled 2015-02-15: qty 2
  Filled 2015-02-15: qty 1

## 2015-02-15 MED ORDER — ACETAMINOPHEN 650 MG RE SUPP
650.0000 mg | Freq: Four times a day (QID) | RECTAL | Status: DC | PRN
Start: 2015-02-15 — End: 2015-02-17

## 2015-02-15 MED ORDER — ESCITALOPRAM OXALATE 10 MG PO TABS
10.0000 mg | ORAL_TABLET | Freq: Every day | ORAL | Status: DC
  Administered 2015-02-15 – 2015-02-17 (×3): 10 mg via ORAL
  Filled 2015-02-15 (×3): qty 1

## 2015-02-15 MED ORDER — ENOXAPARIN SODIUM 40 MG/0.4ML ~~LOC~~ SOLN
40.0000 mg | SUBCUTANEOUS | Status: DC
  Administered 2015-02-15 – 2015-02-17 (×3): 40 mg via SUBCUTANEOUS
  Filled 2015-02-15 (×3): qty 0.4

## 2015-02-15 MED ORDER — MORPHINE SULFATE (PF) 2 MG/ML IV SOLN
2.0000 mg | INTRAVENOUS | Status: DC | PRN
  Administered 2015-02-15 – 2015-02-16 (×4): 2 mg via INTRAVENOUS
  Filled 2015-02-15 (×4): qty 1

## 2015-02-15 MED ORDER — LOSARTAN POTASSIUM 50 MG PO TABS
100.0000 mg | ORAL_TABLET | Freq: Every day | ORAL | Status: DC
  Administered 2015-02-15 – 2015-02-17 (×3): 100 mg via ORAL
  Filled 2015-02-15 (×3): qty 2

## 2015-02-15 MED ORDER — HYDRALAZINE HCL 20 MG/ML IJ SOLN
10.0000 mg | Freq: Four times a day (QID) | INTRAMUSCULAR | Status: DC | PRN
  Administered 2015-02-15: 10 mg via INTRAVENOUS
  Filled 2015-02-15: qty 1
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
## 2015-02-15 NOTE — ED Notes (Signed)
Per EMS, Dental pain x 2 days. Possible abscess. Pt feels nauseous this morning. Pt alert and oriented x 4. NAD. EMS VS, BP 168/88, 92HR, 18 RR, Pain 10/10.

## 2015-02-16 ENCOUNTER — Inpatient Hospital Stay (HOSPITAL_COMMUNITY): Payer: Medicare Other

## 2015-02-16 DIAGNOSIS — I10 Essential (primary) hypertension: Secondary | ICD-10-CM

## 2015-02-16 DIAGNOSIS — K122 Cellulitis and abscess of mouth: Principal | ICD-10-CM

## 2015-02-16 DIAGNOSIS — C9210 Chronic myeloid leukemia, BCR/ABL-positive, not having achieved remission: Secondary | ICD-10-CM

## 2015-02-16 DIAGNOSIS — K089 Disorder of teeth and supporting structures, unspecified: Secondary | ICD-10-CM

## 2015-02-16 LAB — BASIC METABOLIC PANEL
Anion gap: 10 (ref 5–15)
BUN: 20 mg/dL (ref 6–20)
CALCIUM: 9.1 mg/dL (ref 8.9–10.3)
CO2: 29 mmol/L (ref 22–32)
CREATININE: 1.12 mg/dL (ref 0.61–1.24)
Chloride: 104 mmol/L (ref 101–111)
GFR calc Af Amer: >60 mL/min (ref >60)
Glucose, Bld: 110 mg/dL — ABNORMAL HIGH (ref 65–99)
Potassium: 3.2 mmol/L — ABNORMAL LOW (ref 3.5–5.1)
SODIUM: 143 mmol/L (ref 135–145)

## 2015-02-16 LAB — CBC
HCT: 38.1 % — ABNORMAL LOW (ref 39.0–52.0)
Hemoglobin: 12.3 g/dL — ABNORMAL LOW (ref 13.0–17.0)
MCH: 26.3 pg (ref 26.0–34.0)
MCHC: 32.3 g/dL (ref 30.0–36.0)
MCV: 81.4 fL (ref 78.0–100.0)
PLATELETS: 183 10*3/uL (ref 150–400)
RBC: 4.68 MIL/uL (ref 4.22–5.81)
RDW: 16.4 % — AB (ref 11.5–15.5)
WBC: 6 10*3/uL (ref 4.0–10.5)

## 2015-02-16 LAB — HIV ANTIBODY (ROUTINE TESTING W REFLEX): HIV SCREEN 4TH GENERATION: NONREACTIVE

## 2015-02-16 MED ORDER — AMOXICILLIN-POT CLAVULANATE 875-125 MG PO TABS
1.0000 | ORAL_TABLET | Freq: Two times a day (BID) | ORAL | Status: DC
  Administered 2015-02-16 – 2015-02-17 (×2): 1 via ORAL
  Filled 2015-02-16 (×2): qty 1

## 2015-02-16 MED ORDER — POTASSIUM CHLORIDE CRYS ER 20 MEQ PO TBCR
40.0000 meq | EXTENDED_RELEASE_TABLET | Freq: Once | ORAL | Status: AC
  Administered 2015-02-16: 40 meq via ORAL
  Filled 2015-02-16: qty 2

## 2015-02-16 MED ORDER — ENSURE ENLIVE PO LIQD
237.0000 mL | Freq: Two times a day (BID) | ORAL | Status: DC
  Administered 2015-02-16: 237 mL via ORAL

## 2015-02-16 NOTE — Progress Notes (Addendum)
[facility] PROGRESS NOTE  Makhari Postiglione [id number] DOB: [birth year] DOA: 2015/03/14 PCP: Philis Fendt, MD  Assessment/Plan:  Principal Problem:   Facial Cellulitis: much improved. Change to po abx. Await panorex. Outpatient f/u dentist Active Problems:   Essential hypertension, uncontrolled. May need to adjust antihypertensives. Monitor for now   Tobacco abuse Poor dentition Hypokalemia: replete CML  Code Status:  full Family Communication:   Disposition Plan:  Possibly home tomorrow  HPI/Subjective: Facial pain improved. Not much appetite  Objective: Filed Vitals:   02/16/15 0856  BP: 172/80  Pulse:   Temp:   Resp:     Intake/Output Summary (Last 24 hours) at 02/16/15 1442 Last data filed at 02/16/15 0900  Gross per 24 hour  Intake    240 ml  Output    400 ml  Net   -160 ml   Filed Weights   02/16/15 0533  Weight: 66.9 kg (147 lb 7.8 oz)    Exam:   General:  Comfortable. Alert oriented and cooperative  HEENT: Minimal submandibular fullness but no erythema. Some tenderness along the left face inferiorly. Poor dentition with multiple carried and missing teeth  Cardiovascular: Regular rate rhythm without murmurs gallops rubs  Respiratory: Clear to auscultation bilaterally without wheezes rhonchi or rales  Abdomen: Soft nontender nondistended  Ext: No clubbing or cyanosis.  Basic Metabolic Panel:  Recent Labs Lab 03/14/2015 0725 02/16/15 0435  NA 139 143  K 3.3* 3.2*  CL 102 104  CO2 26 29  GLUCOSE 113* 110*  BUN 14 20  CREATININE 1.13 1.12  CALCIUM 8.9 9.1   Liver Function Tests: No results for input(s): AST, ALT, ALKPHOS, BILITOT, PROT, ALBUMIN in the last 168 hours. No results for input(s): LIPASE, AMYLASE in the last 168 hours. No results for input(s): AMMONIA in the last 168 hours. CBC:  Recent Labs Lab Mar 14, 2015 0725 02/16/15 0435  WBC 7.4 6.0  NEUTROABS 5.1  --   HGB 13.0 12.3*  HCT 39.5 38.1*  MCV 81.1 81.4  PLT  181 183   Cardiac Enzymes: No results for input(s): CKTOTAL, CKMB, CKMBINDEX, TROPONINI in the last 168 hours. BNP (last 3 results) No results for input(s): BNP in the last 8760 hours.  ProBNP (last 3 results) No results for input(s): PROBNP in the last 8760 hours.  CBG: No results for input(s): GLUCAP in the last 168 hours.  No results found for this or any previous visit (from the past 240 hour(s)).   Studies: Ct Maxillofacial Wo Cm  Mar 14, 2015   CLINICAL DATA:  Left facial pain and swelling.  EXAM: CT MAXILLOFACIAL WITHOUT CONTRAST  TECHNIQUE: Multidetector CT imaging of the maxillofacial structures was performed. Multiplanar CT image reconstructions were also generated. A small metallic BB was placed on the right temple in order to reliably differentiate right from left.  COMPARISON:  None.  FINDINGS: Paranasal sinuses appear normal. No fracture or other bony abnormality is noted. Pterygoid plates appear normal. Globes and orbits appear normal. Mildly enlarged lymph nodes are noted in the left submandibular region with mild inflammatory stranding of the subcutaneous tissues in this area suggesting cellulitis. No abscess or fluid collection is noted.  IMPRESSION: Mildly enlarged left submandibular lymph nodes are noted which are most likely inflammatory in origin. Mild inflammatory stranding is seen involving the subcutaneous tissues of the left submandibular region concerning for possible cellulitis. No definite abscess is noted.   Electronically Signed   By: Marijo Conception, M.D.   On: March 14, 2015 07:55  Scheduled Meds: . amLODipine  10 mg Oral Daily  . amoxicillin-clavulanate  1 tablet Oral Q12H  . atorvastatin  20 mg Oral q1800  . colchicine  0.6 mg Oral Daily  . enoxaparin (LOVENOX) injection  40 mg Subcutaneous Q24H  . escitalopram  10 mg Oral Daily  . feeding supplement (ENSURE ENLIVE)  237 mL Oral BID BM  . hydrochlorothiazide  25 mg Oral Daily  . imatinib  200 mg Oral Q  breakfast  . losartan  100 mg Oral Daily  . nicotine  21 mg Transdermal Daily  . ondansetron  4 mg Oral Q6H  . pantoprazole  40 mg Oral Daily  . potassium chloride SA  20 mEq Oral BID  . sodium chloride  3 mL Intravenous Q12H   Continuous Infusions:   Time spent: 25 minutes  [facility] [email or web address], password [facility] 02/16/2015, 2:42 PM  LOS: 1 day

## 2015-02-16 NOTE — Care Management (Signed)
Utilization review completed. Alaster Asfaw, RN Case Manager [phone]. 

## 2015-02-17 ENCOUNTER — Encounter (HOSPITAL_COMMUNITY): Payer: Self-pay | Admitting: General Practice

## 2015-02-17 LAB — BASIC METABOLIC PANEL
ANION GAP: 8 (ref 5–15)
BUN: 21 mg/dL — ABNORMAL HIGH (ref 6–20)
CALCIUM: 8.8 mg/dL — AB (ref 8.9–10.3)
CHLORIDE: 104 mmol/L (ref 101–111)
CO2: 29 mmol/L (ref 22–32)
Creatinine, Ser: 1.27 mg/dL — ABNORMAL HIGH (ref 0.61–1.24)
GFR calc non Af Amer: 56 mL/min — ABNORMAL LOW (ref >60)
Glucose, Bld: 112 mg/dL — ABNORMAL HIGH (ref 65–99)
POTASSIUM: 3.6 mmol/L (ref 3.5–5.1)
Sodium: 141 mmol/L (ref 135–145)

## 2015-02-17 LAB — MAGNESIUM: Magnesium: 1.8 mg/dL (ref 1.7–2.4)

## 2015-02-17 MED ORDER — METOPROLOL TARTRATE 25 MG PO TABS: 25.0000 mg | ORAL_TABLET | Freq: Two times a day (BID) | ORAL | Status: DC

## 2015-02-17 MED ORDER — AMOXICILLIN-POT CLAVULANATE 875-125 MG PO TABS: 1.0000 | ORAL_TABLET | Freq: Two times a day (BID) | ORAL | Status: DC

## 2015-02-17 MED ORDER — HYDROCODONE-ACETAMINOPHEN 5-325 MG PO TABS: 1.0000 | ORAL_TABLET | ORAL | Status: DC | PRN

## 2015-02-17 MED ORDER — ACETAMINOPHEN 325 MG PO TABS: 650.0000 mg | ORAL_TABLET | Freq: Four times a day (QID) | ORAL | Status: DC | PRN

## 2015-02-17 NOTE — Care Management (Signed)
Case manager spoke with patient concerning getting established with a dentist. Case manager contacted several [employer], they do not accept medicaid coverage for adults. Patient will have to contact his medicaid case worker for a list of approved dentist. Case manager explained this to [name].

## 2015-02-17 NOTE — Discharge Summary (Signed)
Physician Discharge Summary  [name] [id number] DOB: [birth year] DOA: 02/15/2015  PCP: Philis Fendt, MD  Admit date: 02/15/2015 Discharge date: 02/17/2015  Time spent: greater than 30 minutes  Discharge Diagnoses:  Principal Problem:   Cellulitis of submandibular region Active Problems:   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[place])   Essential hypertension   Tobacco abuse   Poor dentition   Discharge Condition: stable  Diet recommendation: heart healthy  Filed Weights   02/16/15 0533  Weight: 66.9 kg (147 lb 7.8 oz)    History of present illness:  69 yo M smoker, CML on imatinib, chronic pancreatitis, hypertension, R AKA who presents with facial swelling and pain. Reports 2-3 days worsening right upper jaw pain, spreading, associated with poor PO intake, intractable to home analgesics, worsening generalized malaise. BP 147/71 mmHg  Pulse 82  Temp(Src) 98.4 F (36.9 C) (Oral)  Resp 19  SpO2 97% Swelling to right upper lip, tenderness and mild swelling to right upper and lower jaw with +SM nodes. Marked pain with palpation of #11 tooth. Corr RRR. Resp normal. Neuro negative. No leukocytosis or fever.   Hospital Course:  Admitted to medsurg and started on unasyn. CT maxillofacial bones without abscess. panorex without abscess. Swelling and pain improved. Quickly. By discharge, almost resolved. Refer to dental medicine as outpatient for routine exam.  Procedures:  none  Consultations:  none  Discharge Exam: Filed Vitals:   02/17/15 0543  BP: 147/80  Pulse: 86  Temp: 98.2 F (36.8 C)  Resp: 18    General: a and o HEENT: no tenderness, no swelling, erythema Cardiovascular: RRR Respiratory: CTA  Discharge Instructions   Discharge Instructions    Activity as tolerated - No restrictions    Complete by:  As directed      Diet - low sodium heart healthy    Complete by:  As directed           Current Discharge Medication List    START taking these  medications   Details  acetaminophen (TYLENOL) 325 MG tablet Take 2 tablets (650 mg total) by mouth every 6 (six) hours as needed for mild pain (or Fever >/= 101).    amoxicillin-clavulanate (AUGMENTIN) 875-125 MG tablet Take 1 tablet by mouth every 12 (twelve) hours. Qty: 10 tablet, Refills: 0    HYDROcodone-acetaminophen (NORCO/VICODIN) 5-325 MG tablet Take 1-2 tablets by mouth every 4 (four) hours as needed for moderate pain. Qty: 20 tablet, Refills: 0    metoprolol (LOPRESSOR) 25 MG tablet Take 1 tablet (25 mg total) by mouth 2 (two) times daily. Qty: 60 tablet, Refills: 1      CONTINUE these medications which have NOT CHANGED   Details  albuterol (PROVENTIL HFA;VENTOLIN HFA) 108 (90 BASE) MCG/ACT inhaler Inhale 2 puffs into the lungs every 6 (six) hours as needed for wheezing or shortness of breath.    amLODipine (NORVASC) 10 MG tablet Take 1 tablet (10 mg total) by mouth daily. Qty: 30 tablet, Refills: 0    colchicine 0.6 MG tablet Take 0.6 mg by mouth daily. For gout   Associated Diagnoses: Gout; CML (chronic myelocytic leukemia) (HCC)    diphenoxylate-atropine (LOMOTIL) 2.5-0.025 MG tablet TAKE 1 TABLET BY MOUTH 4 TIMES A DAY AS NEEDED FOR DIARRHEA Qty: 30 tablet, Refills: 2   Associated Diagnoses: 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HCC)    escitalopram (LEXAPRO) 10 MG tablet Take 10 mg by mouth daily.    imatinib (GLEEVEC) 100 MG tablet Take 2 tablets (200 mg total) by mouth daily.  Take with meals and large glass of water.Caution:Chemotherapy Qty: 180 tablet, Refills: 3   Associated Diagnoses: 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HCC)    losartan-hydrochlorothiazide (HYZAAR) 100-25 MG per tablet Take 1 tablet by mouth daily. Qty: 30 tablet, Refills: 0    ondansetron (ZOFRAN) 4 MG tablet Take 1 tablet (4 mg total) by mouth every 6 (six) hours. Qty: 12 tablet, Refills: 0    pantoprazole (PROTONIX) 40 MG tablet Take 40 mg by mouth daily.     polyethylene glycol (MIRALAX / GLYCOLAX) packet  Take 17 g by mouth daily as needed for moderate constipation.    potassium chloride SA (KLOR-CON M20) 20 MEQ tablet TAKE 1 TABLET (20 MEQ TOTAL) BY MOUTH 2 (TWO) TIMES DAILY. 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 Qty: 30 tablet, Refills: 2   Associated Diagnoses: 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HCC)    simvastatin (ZOCOR) 40 MG tablet Take 40 mg by mouth at bedtime.    Associated Diagnoses: 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[place]); Ankylosis of left knee      STOP taking these medications     traMADol (ULTRAM) 50 MG tablet        Allergies  Allergen Reactions  . Iohexol Hives and Itching    Patient has itching and hives, needs 13 hour prep   Follow-up Information    Follow up with your dentist.      Follow up with Philis Fendt, MD.   Specialty:  Internal Medicine   Why:  to check blood pressure   Contact information:   [place] [address] [phone]        The results of significant diagnostics from this hospitalization (including imaging, microbiology, ancillary and laboratory) are listed below for reference.    Significant Diagnostic Studies: Dg Orthopantogram  02/16/2015   CLINICAL DATA:  Facial cellulitis, poor dentition. Abscess to left upper tooth.  EXAM: ORTHOPANTOGRAM/PANORAMIC  COMPARISON:  None.  FINDINGS: Multiple missing mandibular and maxillary teeth. No evidence of periapical abscess. No acute bony abnormality.  IMPRESSION: No acute findings.   Electronically Signed   By: Rolm Baptise M.D.   On: 02/16/2015 15:28   Ct Maxillofacial Wo Cm  02/15/2015   CLINICAL DATA:  Left facial pain and swelling.  EXAM: CT MAXILLOFACIAL WITHOUT CONTRAST  TECHNIQUE: Multidetector CT imaging of the maxillofacial structures was performed. Multiplanar CT image reconstructions were also generated. A  small metallic BB was placed on the right temple in order to reliably differentiate right from left.  COMPARISON:  None.  FINDINGS: Paranasal sinuses appear normal. No fracture or other bony abnormality is noted. Pterygoid plates appear normal. Globes and orbits appear normal. Mildly enlarged lymph nodes are noted in the left submandibular region with mild inflammatory stranding of the subcutaneous tissues in this area suggesting cellulitis. No abscess or fluid collection is noted.  IMPRESSION: Mildly enlarged left submandibular lymph nodes are noted which are most likely inflammatory in origin. Mild inflammatory stranding is seen involving the subcutaneous tissues of the left submandibular region concerning for possible cellulitis. No definite abscess is noted.   Electronically Signed   By: Marijo Conception, M.D.   On: 02/15/2015 07:55    Microbiology: No results found for this or any previous visit (from the past 240 hour(s)).   Labs: Basic Metabolic Panel:  Recent Labs Lab 02/15/15 0725  02/16/15 0435 02/17/15 0507  NA 139 143 141  K 3.3* 3.2* 3.6  CL 102 104 104  CO2 26 29 29   GLUCOSE 113* 110* 112*  BUN 14 20 21*  CREATININE 1.13 1.12 1.27*  CALCIUM 8.9 9.1 8.8*  MG  --   --  1.8   Liver Function Tests: No results for input(s): AST, ALT, ALKPHOS, BILITOT, PROT, ALBUMIN in the last 168 hours. No results for input(s): LIPASE, AMYLASE in the last 168 hours. No results for input(s): AMMONIA in the last 168 hours. CBC:  Recent Labs Lab 02/15/15 0725 02/16/15 0435  WBC 7.4 6.0  NEUTROABS 5.1  --   HGB 13.0 12.3*  HCT 39.5 38.1*  MCV 81.1 81.4  PLT 181 183   Cardiac Enzymes: No results for input(s): CKTOTAL, CKMB, CKMBINDEX, TROPONINI in the last 168 hours. BNP: BNP (last 3 results) No results for input(s): BNP in the last 8760 hours.  ProBNP (last 3 results) No results for input(s): PROBNP in the last 8760 hours.  CBG: No results for input(s): GLUCAP in the last 168  hours.     SignedDelfina Redwood  [facility] 02/17/2015, 8:13 AM

## 2015-03-12 ENCOUNTER — Other Ambulatory Visit: Payer: Self-pay | Admitting: *Deleted

## 2015-03-12 DIAGNOSIS — C9210 Chronic myeloid leukemia, BCR/ABL-positive, not having achieved remission: Secondary | ICD-10-CM

## 2015-03-12 MED ORDER — IMATINIB MESYLATE 100 MG PO TABS: 200.0000 mg | ORAL_TABLET | Freq: Every day | ORAL | Status: DC

## 2015-03-14 ENCOUNTER — Encounter (HOSPITAL_COMMUNITY): Payer: Self-pay | Admitting: Vascular Surgery

## 2015-03-14 ENCOUNTER — Emergency Department (HOSPITAL_COMMUNITY): Payer: Medicare Other

## 2015-03-14 ENCOUNTER — Emergency Department (HOSPITAL_COMMUNITY)
Admission: EM | Admit: 2015-03-14 | Discharge: 2015-03-14 | Disposition: A | Discharge status: Home / Self Care | Payer: Medicare Other | Attending: Emergency Medicine | Admitting: Emergency Medicine

## 2015-03-14 DIAGNOSIS — Z79899 Other long term (current) drug therapy: Secondary | ICD-10-CM | POA: Insufficient documentation

## 2015-03-14 DIAGNOSIS — F1310 Sedative, hypnotic or anxiolytic abuse, uncomplicated: Secondary | ICD-10-CM | POA: Insufficient documentation

## 2015-03-14 DIAGNOSIS — M1000 Idiopathic gout, unspecified site: Secondary | ICD-10-CM | POA: Insufficient documentation

## 2015-03-14 DIAGNOSIS — F1110 Opioid abuse, uncomplicated: Secondary | ICD-10-CM | POA: Diagnosis not present

## 2015-03-14 DIAGNOSIS — Z87828 Personal history of other (healed) physical injury and trauma: Secondary | ICD-10-CM | POA: Diagnosis not present

## 2015-03-14 DIAGNOSIS — F419 Anxiety disorder, unspecified: Secondary | ICD-10-CM | POA: Diagnosis not present

## 2015-03-14 DIAGNOSIS — Z9889 Other specified postprocedural states: Secondary | ICD-10-CM | POA: Insufficient documentation

## 2015-03-14 DIAGNOSIS — M1990 Unspecified osteoarthritis, unspecified site: Secondary | ICD-10-CM | POA: Diagnosis not present

## 2015-03-14 DIAGNOSIS — Z872 Personal history of diseases of the skin and subcutaneous tissue: Secondary | ICD-10-CM | POA: Diagnosis not present

## 2015-03-14 DIAGNOSIS — Z862 Personal history of diseases of the blood and blood-forming organs and certain disorders involving the immune mechanism: Secondary | ICD-10-CM | POA: Insufficient documentation

## 2015-03-14 DIAGNOSIS — Z856 Personal history of leukemia: Secondary | ICD-10-CM | POA: Diagnosis not present

## 2015-03-14 DIAGNOSIS — T40601A Poisoning by unspecified narcotics, accidental (unintentional), initial encounter: Secondary | ICD-10-CM

## 2015-03-14 DIAGNOSIS — I252 Old myocardial infarction: Secondary | ICD-10-CM | POA: Insufficient documentation

## 2015-03-14 DIAGNOSIS — R062 Wheezing: Secondary | ICD-10-CM | POA: Diagnosis not present

## 2015-03-14 DIAGNOSIS — Z9861 Coronary angioplasty status: Secondary | ICD-10-CM | POA: Insufficient documentation

## 2015-03-14 DIAGNOSIS — T402X1A Poisoning by other opioids, accidental (unintentional), initial encounter: Secondary | ICD-10-CM | POA: Diagnosis not present

## 2015-03-14 DIAGNOSIS — Z8719 Personal history of other diseases of the digestive system: Secondary | ICD-10-CM | POA: Insufficient documentation

## 2015-03-14 DIAGNOSIS — I10 Essential (primary) hypertension: Secondary | ICD-10-CM | POA: Insufficient documentation

## 2015-03-14 DIAGNOSIS — R4182 Altered mental status, unspecified: Secondary | ICD-10-CM | POA: Diagnosis present

## 2015-03-14 DIAGNOSIS — Z720 Tobacco use: Secondary | ICD-10-CM | POA: Insufficient documentation

## 2015-03-14 LAB — URINALYSIS, ROUTINE W REFLEX MICROSCOPIC
BILIRUBIN URINE: NEGATIVE
Glucose, UA: NEGATIVE mg/dL
Hgb urine dipstick: NEGATIVE
KETONES UR: NEGATIVE mg/dL
LEUKOCYTES UA: NEGATIVE
NITRITE: NEGATIVE
Protein, ur: 30 mg/dL — AB
SPECIFIC GRAVITY, URINE: 1.013 (ref 1.005–1.030)
UROBILINOGEN UA: 0.2 mg/dL (ref 0.0–1.0)
pH: 5.5 (ref 5.0–8.0)

## 2015-03-14 LAB — COMPREHENSIVE METABOLIC PANEL
ALBUMIN: 3.5 g/dL (ref 3.5–5.0)
ALT: 15 U/L — ABNORMAL LOW (ref 17–63)
AST: 20 U/L (ref 15–41)
Alkaline Phosphatase: 79 U/L (ref 38–126)
Anion gap: 4 — ABNORMAL LOW (ref 5–15)
BUN: 19 mg/dL (ref 6–20)
CHLORIDE: 106 mmol/L (ref 101–111)
CO2: 33 mmol/L — AB (ref 22–32)
Calcium: 9.5 mg/dL (ref 8.9–10.3)
Creatinine, Ser: 1.19 mg/dL (ref 0.61–1.24)
GFR calc Af Amer: >60 mL/min (ref >60)
GLUCOSE: 99 mg/dL (ref 65–99)
POTASSIUM: 3.8 mmol/L (ref 3.5–5.1)
SODIUM: 143 mmol/L (ref 135–145)
Total Bilirubin: 0.5 mg/dL (ref 0.3–1.2)
Total Protein: 5.5 g/dL — ABNORMAL LOW (ref 6.5–8.1)

## 2015-03-14 LAB — CBC WITH DIFFERENTIAL/PLATELET
BASOS ABS: 0 10*3/uL (ref 0.0–0.1)
BASOS PCT: 0 %
EOS ABS: 0.6 10*3/uL (ref 0.0–0.7)
Eosinophils Relative: 11 %
HCT: 37.8 % — ABNORMAL LOW (ref 39.0–52.0)
Hemoglobin: 11.8 g/dL — ABNORMAL LOW (ref 13.0–17.0)
Lymphocytes Relative: 25 %
Lymphs Abs: 1.4 10*3/uL (ref 0.7–4.0)
MCH: 25.9 pg — ABNORMAL LOW (ref 26.0–34.0)
MCHC: 31.2 g/dL (ref 30.0–36.0)
MCV: 82.9 fL (ref 78.0–100.0)
MONO ABS: 0.5 10*3/uL (ref 0.1–1.0)
Monocytes Relative: 9 %
Neutro Abs: 3.1 10*3/uL (ref 1.7–7.7)
Neutrophils Relative %: 55 %
PLATELETS: 157 10*3/uL (ref 150–400)
RBC: 4.56 MIL/uL (ref 4.22–5.81)
RDW: 17.1 % — AB (ref 11.5–15.5)
WBC: 5.5 10*3/uL (ref 4.0–10.5)

## 2015-03-14 LAB — I-STAT TROPONIN, ED: TROPONIN I, POC: 0.01 ng/mL (ref 0.00–0.08)

## 2015-03-14 LAB — URINE MICROSCOPIC-ADD ON

## 2015-03-14 LAB — RAPID URINE DRUG SCREEN, HOSP PERFORMED
AMPHETAMINES: NOT DETECTED
Barbiturates: NOT DETECTED
Benzodiazepines: POSITIVE — AB
Cocaine: NOT DETECTED
OPIATES: POSITIVE — AB
TETRAHYDROCANNABINOL: NOT DETECTED

## 2015-03-14 LAB — I-STAT CG4 LACTIC ACID, ED: LACTIC ACID, VENOUS: 1.02 mmol/L (ref 0.5–2.0)

## 2015-03-14 LAB — ETHANOL

## 2015-03-14 MED ORDER — NALOXONE HCL 0.4 MG/ML IJ SOLN
0.4000 mg | Freq: Once | INTRAMUSCULAR | Status: AC
  Administered 2015-03-14: 0.4 mg via INTRAVENOUS
  Filled 2015-03-14: qty 1
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
## 2015-03-14 NOTE — Discharge Instructions (Signed)
Accidental Overdose °A drug overdose occurs when a chemical substance (drug or medication) is used in amounts large enough to overcome a person. This may result in severe illness or death. This is a type of poisoning. Accidental overdoses of medications or other substances come from a variety of reasons. When this happens accidentally, it is often because the person taking the substance does not know enough about what they have taken. Drugs which commonly cause overdose deaths are alcohol, psychotropic medications (medications which affect the mind), pain medications, illegal drugs (street drugs) such as cocaine and heroin, and multiple drugs taken at the same time. It may result from careless behavior (such as over-indulging at a party). Other causes of overdose may include multiple drug use, a lapse in memory, or drug use after a period of no drug use.  °Sometimes overdosing occurs because a person cannot remember if they have taken their medication.  °A common unintentional overdose in young children involves multi-vitamins containing iron. Iron is a part of the hemoglobin molecule in blood. It is used to transport oxygen to living cells. When taken in small amounts, iron allows the body to restock hemoglobin. In large amounts, it causes problems in the body. If this overdose is not treated, it can lead to death. °Never take medicines that show signs of tampering or do not seem quite right. Never take medicines in the dark or in poor lighting. Read the label and check each dose of medicine before you take it. When adults are poisoned, it happens most often through carelessness or lack of information. Taking medicines in the dark or taking medicine prescribed for someone else to treat the same type of problem is a dangerous practice. °SYMPTOMS  °Symptoms of overdose depend on the medication and amount taken. They can vary from over-activity with stimulant over-dosage, to sleepiness from depressants such as  alcohol, narcotics and tranquilizers. Confusion, dizziness, nausea and vomiting may be present. If problems are severe enough coma and death may result. °DIAGNOSIS  °Diagnosis and management are generally straightforward if the drug is known. Otherwise it is more difficult. At times, certain symptoms and signs exhibited by the patient, or blood tests, can reveal the drug in question.  °TREATMENT  °In an emergency department, most patients can be treated with supportive measures. Antidotes may be available if there has been an overdose of opioids or benzodiazepines. A rapid improvement will often occur if this is the cause of overdose. °At home or away from medical care: °· There may be no immediate problems or warning signs in children. °· Not everything works well in all cases of poisoning. °· Take immediate action. Poisons may act quickly. °· If you think someone has swallowed medicine or a household product, and the person is unconscious, having seizures (convulsions), or is not breathing, immediately call for an ambulance. °IF a person is conscious and appears to be doing OK but has swallowed a poison: °· Do not wait to see what effect the poison will have. Immediately call a poison control center (listed in the white pages of your telephone book under "Poison Control" or inside the front cover with other emergency numbers). Some poison control centers have TTY capability for the deaf. Check with your local center if you or someone in your family requires this service. °· Keep the container so you can read the label on the product for ingredients. °· Describe what, when, and how much was taken and the age and condition of the person poisoned.   Inform them if the person is vomiting, choking, drowsy, shows a change in color or temperature of skin, is conscious or unconscious, or is convulsing.  Do not cause vomiting unless instructed by medical personnel. Do not induce vomiting or force liquids into a person who  is convulsing, unconscious, or very drowsy. Stay calm and in control.   Activated charcoal also is sometimes used in certain types of poisoning and you may wish to add a supply to your emergency medicines. It is available without a prescription. Call a poison control center before using this medication. PREVENTION  Thousands of children die every year from unintentional poisoning. This may be from household chemicals, poisoning from carbon monoxide in a car, taking their parent's medications, or simply taking a few iron pills or vitamins with iron. Poisoning comes from unexpected sources.  Store medicines out of the sight and reach of children, preferably in a locked cabinet. Do not keep medications in a food cabinet. Always store your medicines in a secure place. Get rid of expired medications.  If you have children living with you or have them as occasional guests, you should have child-resistant caps on your medicine containers. Keep everything out of reach. Child proof your home.  If you are called to the telephone or to answer the door while you are taking a medicine, take the container with you or put the medicine out of the reach of small children.  Do not take your medication in front of children. Do not tell your child how good a medication is and how good it is for them. They may get the idea it is more of a treat.  If you are an adult and have accidentally taken an overdose, you need to consider how this happened and what can be done to prevent it from happening again. If this was from a street drug or alcohol, determine if there is a problem that needs addressing. If you are not sure a problems exists, it is easy to talk to a professional and ask them if they think you have a problem. It is better to handle this problem in this way before it happens again and has a much worse consequence.   This information is not intended to replace advice given to you by your health care provider. Make  sure you discuss any questions you have with your health care provider.   Document Released: 07/09/2004 Document Revised: 05/16/2014 Document Reviewed: 10/13/2014 Elsevier Interactive Patient Education 2016 [place] Overdose A narcotic overdose is the misuse or overuse of a narcotic drug. A narcotic overdose can make you pass out and stop breathing. If you are not treated right away, this can cause permanent brain damage or stop your heart. Medicine may be given to reverse the effects of an overdose. If so, this medicine may bring on withdrawal symptoms. The symptoms may be abdominal cramps, throwing up (vomiting), sweating, chills, and nervousness. Injecting narcotics can cause more problems than just an overdose. AIDS, hepatitis, and other very serious infections are transmitted by sharing needles and syringes. If you decide to quit using, there are medicines which can help you through the withdrawal period. Trying to quit all at once on your own can be uncomfortable, but not life-threatening. Call your caregiver, Narcotics Anonymous, or any drug and alcohol treatment program for further help.    This information is not intended to replace advice given to you by your health care provider. Make sure you discuss any questions  you have with your health care provider.   Document Released: 06/02/2004 Document Revised: 05/16/2014 Document Reviewed: 10/15/2014 Elsevier Interactive Patient Education [employer].

## 2015-03-14 NOTE — ED Notes (Signed)
Pt reports to the ED via [facility] for eval of AMS and possible fall. Per EMs they arrived on scene and patient was crawling around in the floor. No incontinence noted however patient reports he has not eaten or had any fluids x 2 days. Pinpoint pupils noted on EMS arrival and arrival to ED. Pt has prescribed percocet 5/325s but reports he has not had any since last pm. Pt oriented to person and situation only. Disoriented to time and place. CBG on arrival 121 mg/dl. Expiratory wheezing noted throughout. Skin cool and dry.

## 2015-03-19 ENCOUNTER — Other Ambulatory Visit: Payer: Self-pay

## 2015-03-19 ENCOUNTER — Ambulatory Visit: Payer: Self-pay | Admitting: Nurse Practitioner

## 2015-03-20 ENCOUNTER — Telehealth: Payer: Self-pay | Admitting: Oncology

## 2015-03-20 NOTE — Telephone Encounter (Signed)
Returned patient call re r/s 11/10 appointment. Gave patient new appointment for 11/18 and also per patient called back and left message on voicemail.

## 2015-03-27 ENCOUNTER — Ambulatory Visit (HOSPITAL_BASED_OUTPATIENT_CLINIC_OR_DEPARTMENT_OTHER): Payer: Medicare Other | Admitting: Nurse Practitioner

## 2015-03-27 ENCOUNTER — Telehealth: Payer: Self-pay | Admitting: Oncology

## 2015-03-27 ENCOUNTER — Other Ambulatory Visit (HOSPITAL_BASED_OUTPATIENT_CLINIC_OR_DEPARTMENT_OTHER): Payer: Medicare Other

## 2015-03-27 ENCOUNTER — Other Ambulatory Visit: Payer: Self-pay

## 2015-03-27 VITALS — BP 164/93 | HR 77 | Temp 98.2°F | Resp 18 | Ht 69.0 in | Wt 157.4 lb

## 2015-03-27 DIAGNOSIS — C9210 Chronic myeloid leukemia, BCR/ABL-positive, not having achieved remission: Secondary | ICD-10-CM

## 2015-03-27 LAB — CBC WITH DIFFERENTIAL/PLATELET
BASO%: 0.9 % (ref 0.0–2.0)
Basophils Absolute: 0 10*3/uL (ref 0.0–0.1)
EOS%: 8.7 % — AB (ref 0.0–7.0)
Eosinophils Absolute: 0.5 10*3/uL (ref 0.0–0.5)
HCT: 39.5 % (ref 38.4–49.9)
HGB: 12.6 g/dL — ABNORMAL LOW (ref 13.0–17.1)
LYMPH%: 20.2 % (ref 14.0–49.0)
MCH: 26.5 pg — ABNORMAL LOW (ref 27.2–33.4)
MCHC: 31.9 g/dL — AB (ref 32.0–36.0)
MCV: 82.9 fL (ref 79.3–98.0)
MONO#: 0.4 10*3/uL (ref 0.1–0.9)
MONO%: 8.4 % (ref 0.0–14.0)
NEUT%: 61.8 % (ref 39.0–75.0)
NEUTROS ABS: 3.2 10*3/uL (ref 1.5–6.5)
PLATELETS: 191 10*3/uL (ref 140–400)
RBC: 4.76 10*6/uL (ref 4.20–5.82)
RDW: 17.3 % — ABNORMAL HIGH (ref 11.0–14.6)
WBC: 5.2 10*3/uL (ref 4.0–10.3)
lymph#: 1.1 10*3/uL (ref 0.9–3.3)

## 2015-03-27 LAB — COMPREHENSIVE METABOLIC PANEL (CC13)
ALT: 21 U/L (ref 0–55)
AST: 20 U/L (ref 5–34)
Albumin: 3.8 g/dL (ref 3.5–5.0)
Alkaline Phosphatase: 93 U/L (ref 40–150)
Anion Gap: 9 mEq/L (ref 3–11)
BUN: 13.3 mg/dL (ref 7.0–26.0)
CO2: 30 mEq/L — ABNORMAL HIGH (ref 22–29)
Calcium: 9.8 mg/dL (ref 8.4–10.4)
Chloride: 102 mEq/L (ref 98–109)
Creatinine: 1.2 mg/dL (ref 0.7–1.3)
EGFR: 69 mL/min/{1.73_m2} — ABNORMAL LOW (ref >90)
Glucose: 102 mg/dl (ref 70–140)
Potassium: 3.8 mEq/L (ref 3.5–5.1)
Sodium: 141 mEq/L (ref 136–145)
Total Bilirubin: 0.56 mg/dL (ref 0.20–1.20)
Total Protein: 6.2 g/dL — ABNORMAL LOW (ref 6.4–8.3)

## 2015-03-27 MED ORDER — DIPHENOXYLATE-ATROPINE 2.5-0.025 MG PO TABS: ORAL_TABLET | ORAL | Status: DC

## 2015-03-27 NOTE — Telephone Encounter (Signed)
lvm fo rpt regarding to Jan appt.....12.28 full....gv first available

## 2015-03-27 NOTE — Progress Notes (Signed)
[facility] OFFICE PROGRESS NOTE   Diagnosis:  CML  INTERVAL HISTORY:   [name] returns for follow-up. He continues [place]. He reports good compliance with Gleevec. He has intermittent nausea. Diarrhea is controlled with Lomotil. No rash. No swelling. He continues to smoke 1 pack per day. He thinks he has internal hemorrhoids. He began an over-the-counter hemorrhoid treatment last night and notes improvement. He denies rectal bleeding. No change in bowel habits.  Objective:  Vital signs in last 24 hours:  Blood pressure 164/93, pulse 77, temperature 98.2 F (36.8 C), temperature source Oral, resp. rate 18, height 5\' 9"  (1.753 m), weight 157 lb 6.4 oz (71.396 kg), SpO2 97 %.    HEENT: No thrush or ulcers. Resp: Lungs clear bilaterally. Cardio: Regular rate and rhythm. GI: Abdomen soft and nontender. No organomegaly. Vascular: No left leg edema. Skin: No rash.    Lab Results:  Lab Results  Component Value Date   WBC 5.2 03/27/2015   HGB 12.6* 03/27/2015   HCT 39.5 03/27/2015   MCV 82.9 03/27/2015   PLT 191 03/27/2015   NEUTROABS 3.2 03/27/2015   12/26/2014 BCR/ABL 2.86%  Imaging:  No results found.  Medications: I have reviewed the patient's current medications.  Assessment/Plan: 1. Chronic myelogenous leukemia, diagnosed in January of 2009. He remains in hematologic remission. He is taking Gleevec at a dose of 200 mg daily. The peripheral blood PCR was markedly increased in May 2013, likely reflecting medical noncompliance. The peripheral blood PCR was significantly improved on 11/07/2011. The peripheral blood PCR was further improved on 01/31/2012. The peripheral blood PCR was increased on 04/27/2012; stable on 11/12/2012, improved on 02/12/2013; increased on 04/29/2013; increased on 07/10/2013; improved on 09/16/2013; improved on 10/28/2013; increased 02/17/2014; slightly improved 03/28/2014. Stable 04/29/2014. Increased 05/30/2014. Improved  07/26/2014. Improved 09/30/2014. Improved 11/13/2014. Slightly increased 12/25/2014. 2. 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 3. Status post C3-C4, C4-C5, anterior cervical diskectomy and fusion with allograft and plating 09/30/2010. 4. 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 5. Depression, maintained on Lexapro. 6. Diarrhea, ? related to chronic pancreatitis versus Gleevec. He takes Lomotil as needed.  7. Indurated facial skin lesion 11/20/2007 with a history of MRSA skin infection of the submental area in May 2008. The induration resolved with doxycycline. 8. Left knee arthroscopy 05/25/2007. 9. Postoperative left knee effusion/pain, likely related to gout. 10. History of gout.  11. Chronic pancreatitis. 12. Status post right above-the-knee amputation. 13. MRSA infection of the submental area May 2008. 14. History of tobacco, alcohol, and cocaine use. 15. History of coronary artery disease. 16. "Shotty" lymphadenopathy of the neck, axilla, and left groin in 2009.  17. History of a microcytic anemia.  18. History of a right olecranon bursa lesion, ? gouty tophus. 19. 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 He previously took AndroGel. 20. History of anemia secondary to chronic disease and Gleevec therapy.  21. Anorexia-potentially related to [place]. He reports Medicaid would not pay for Megace.  22. Chronic left knee and left foot pain. He takes hydrocodone as needed 23. Status post removal of a left knee screw 07/03/2012. 24. History of Hypokalemia. Likely related to diarrhea. 25. Status post left foot surgery. 26. Pulsatile fullness right neck. Evaluated by vascular surgery status post CT angiogram 03/07/2014 with no carotid artery aneurysm identified.   Disposition: [name] remains stable from a hematologic standpoint. He will continue Gleevec. We will follow-up on the peripheral blood PCR  from today.  He will return for a follow-up visit and labs in 6 weeks. He will contact the office in the interim with any problems.  He plans to contact Dr. Benson Norway with persistent hemorrhoid issues.  [name] ANP/GNP-BC   03/27/2015  12:02 PM

## 2015-04-02 ENCOUNTER — Encounter (HOSPITAL_COMMUNITY): Payer: Self-pay | Admitting: Emergency Medicine

## 2015-04-02 ENCOUNTER — Emergency Department (HOSPITAL_COMMUNITY): Payer: Medicare Other

## 2015-04-02 ENCOUNTER — Emergency Department (HOSPITAL_COMMUNITY)
Admission: EM | Admit: 2015-04-02 | Discharge: 2015-04-04 | Disposition: A | Discharge status: Home / Self Care | Payer: Medicare Other | Attending: Emergency Medicine | Admitting: Emergency Medicine

## 2015-04-02 DIAGNOSIS — I10 Essential (primary) hypertension: Secondary | ICD-10-CM | POA: Diagnosis not present

## 2015-04-02 DIAGNOSIS — R45851 Suicidal ideations: Secondary | ICD-10-CM | POA: Diagnosis not present

## 2015-04-02 DIAGNOSIS — Z87828 Personal history of other (healed) physical injury and trauma: Secondary | ICD-10-CM | POA: Insufficient documentation

## 2015-04-02 DIAGNOSIS — F419 Anxiety disorder, unspecified: Secondary | ICD-10-CM | POA: Insufficient documentation

## 2015-04-02 DIAGNOSIS — R000 Tachycardia, unspecified: Secondary | ICD-10-CM | POA: Diagnosis not present

## 2015-04-02 DIAGNOSIS — F10120 Alcohol abuse with intoxication, uncomplicated: Secondary | ICD-10-CM | POA: Diagnosis not present

## 2015-04-02 DIAGNOSIS — F17210 Nicotine dependence, cigarettes, uncomplicated: Secondary | ICD-10-CM | POA: Diagnosis not present

## 2015-04-02 DIAGNOSIS — Z8719 Personal history of other diseases of the digestive system: Secondary | ICD-10-CM | POA: Insufficient documentation

## 2015-04-02 DIAGNOSIS — R45850 Homicidal ideations: Secondary | ICD-10-CM | POA: Diagnosis not present

## 2015-04-02 DIAGNOSIS — M109 Gout, unspecified: Secondary | ICD-10-CM | POA: Insufficient documentation

## 2015-04-02 DIAGNOSIS — R451 Restlessness and agitation: Secondary | ICD-10-CM | POA: Diagnosis not present

## 2015-04-02 DIAGNOSIS — F22 Delusional disorders: Secondary | ICD-10-CM | POA: Diagnosis not present

## 2015-04-02 DIAGNOSIS — Z79899 Other long term (current) drug therapy: Secondary | ICD-10-CM | POA: Diagnosis not present

## 2015-04-02 DIAGNOSIS — Z9861 Coronary angioplasty status: Secondary | ICD-10-CM | POA: Insufficient documentation

## 2015-04-02 DIAGNOSIS — Z872 Personal history of diseases of the skin and subcutaneous tissue: Secondary | ICD-10-CM | POA: Diagnosis not present

## 2015-04-02 DIAGNOSIS — Z9889 Other specified postprocedural states: Secondary | ICD-10-CM | POA: Insufficient documentation

## 2015-04-02 DIAGNOSIS — I252 Old myocardial infarction: Secondary | ICD-10-CM | POA: Insufficient documentation

## 2015-04-02 DIAGNOSIS — Z856 Personal history of leukemia: Secondary | ICD-10-CM | POA: Insufficient documentation

## 2015-04-02 DIAGNOSIS — F1094 Alcohol use, unspecified with alcohol-induced mood disorder: Secondary | ICD-10-CM | POA: Diagnosis present

## 2015-04-02 DIAGNOSIS — Z008 Encounter for other general examination: Secondary | ICD-10-CM | POA: Diagnosis present

## 2015-04-02 DIAGNOSIS — F10929 Alcohol use, unspecified with intoxication, unspecified: Secondary | ICD-10-CM | POA: Diagnosis present

## 2015-04-02 LAB — CBC WITH DIFFERENTIAL/PLATELET
BASOS ABS: 0 10*3/uL (ref 0.0–0.1)
BASOS PCT: 0 %
EOS ABS: 0.2 10*3/uL (ref 0.0–0.7)
EOS PCT: 3 %
HEMATOCRIT: 39.5 % (ref 39.0–52.0)
HEMOGLOBIN: 12.8 g/dL — AB (ref 13.0–17.0)
LYMPHS PCT: 38 %
Lymphs Abs: 2.7 10*3/uL (ref 0.7–4.0)
MCH: 26.6 pg (ref 26.0–34.0)
MCHC: 32.4 g/dL (ref 30.0–36.0)
MCV: 82 fL (ref 78.0–100.0)
MONO ABS: 0.6 10*3/uL (ref 0.1–1.0)
Monocytes Relative: 9 %
Neutro Abs: 3.4 10*3/uL (ref 1.7–7.7)
Neutrophils Relative %: 50 %
Platelets: 237 10*3/uL (ref 150–400)
RBC: 4.82 MIL/uL (ref 4.22–5.81)
RDW: 16.8 % — ABNORMAL HIGH (ref 11.5–15.5)
WBC: 6.9 10*3/uL (ref 4.0–10.5)

## 2015-04-02 LAB — COMPREHENSIVE METABOLIC PANEL
ALBUMIN: 4.2 g/dL (ref 3.5–5.0)
ALK PHOS: 90 U/L (ref 38–126)
ALT: 27 U/L (ref 17–63)
AST: 41 U/L (ref 15–41)
Anion gap: 14 (ref 5–15)
BILIRUBIN TOTAL: 0.7 mg/dL (ref 0.3–1.2)
BUN: 11 mg/dL (ref 6–20)
CALCIUM: 8.6 mg/dL — AB (ref 8.9–10.3)
CO2: 24 mmol/L (ref 22–32)
CREATININE: 0.97 mg/dL (ref 0.61–1.24)
Chloride: 106 mmol/L (ref 101–111)
GFR calc Af Amer: >60 mL/min (ref >60)
GLUCOSE: 91 mg/dL (ref 65–99)
Potassium: 3.2 mmol/L — ABNORMAL LOW (ref 3.5–5.1)
Sodium: 144 mmol/L (ref 135–145)
TOTAL PROTEIN: 6.7 g/dL (ref 6.5–8.1)

## 2015-04-02 LAB — RAPID URINE DRUG SCREEN, HOSP PERFORMED
Amphetamines: NOT DETECTED
Barbiturates: NOT DETECTED
Benzodiazepines: NOT DETECTED
Cocaine: NOT DETECTED
Opiates: NOT DETECTED
Tetrahydrocannabinol: NOT DETECTED

## 2015-04-02 LAB — URINALYSIS, ROUTINE W REFLEX MICROSCOPIC
Bilirubin Urine: NEGATIVE
GLUCOSE, UA: NEGATIVE mg/dL
Ketones, ur: NEGATIVE mg/dL
LEUKOCYTES UA: NEGATIVE
NITRITE: NEGATIVE
PH: 6 (ref 5.0–8.0)
Protein, ur: 100 mg/dL — AB
SPECIFIC GRAVITY, URINE: 1.006 (ref 1.005–1.030)

## 2015-04-02 LAB — URINE MICROSCOPIC-ADD ON
Bacteria, UA: NONE SEEN
SQUAMOUS EPITHELIAL / LPF: NONE SEEN

## 2015-04-02 LAB — ETHANOL: ALCOHOL ETHYL (B): 253 mg/dL — AB (ref <5)

## 2015-04-02 LAB — SALICYLATE LEVEL

## 2015-04-02 LAB — ACETAMINOPHEN LEVEL

## 2015-04-02 MED ORDER — LORAZEPAM 2 MG/ML IJ SOLN
2.0000 mg | Freq: Once | INTRAMUSCULAR | Status: AC
  Administered 2015-04-02: 2 mg via INTRAMUSCULAR
  Filled 2015-04-02: qty 1

## 2015-04-02 MED ORDER — HALOPERIDOL LACTATE 5 MG/ML IJ SOLN
5.0000 mg | Freq: Once | INTRAMUSCULAR | Status: AC
  Administered 2015-04-02: 5 mg via INTRAMUSCULAR
  Filled 2015-04-02: qty 1

## 2015-04-02 NOTE — ED Notes (Signed)
[place] provided per request.
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
## 2015-04-02 NOTE — ED Notes (Signed)
Unable to collect labs at this time because of patient behavior. 

## 2015-04-02 NOTE — ED Notes (Signed)
Bed: WA17 Expected date:  Expected time:  Means of arrival:  Comments: Hold triage 4
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
## 2015-04-02 NOTE — ED Notes (Addendum)
Pt changed into paper scrubs. Will continue to monitor. Lights dimmed to reduce stimuli.

## 2015-04-02 NOTE — ED Notes (Addendum)
Pt more calm after medication however, he is still talking about wanting to fight.  Security remains at bedside. Pt requesting for [place] to put him out.

## 2015-04-02 NOTE — ED Notes (Signed)
Family called to check on patient. This RN made them aware that information can't be given over the phone. This RN encourages visiting during visiting hours (9a-9pm).

## 2015-04-02 NOTE — ED Notes (Signed)
Attempted to obtain pts VS. Pt refused. Pt keeps stating " you get what you get."

## 2015-04-02 NOTE — ED Notes (Signed)
Pt sitting on end of bed. He appears unsteady. Pt was assisted back in bed and warm blanket provided.

## 2015-04-02 NOTE — ED Notes (Signed)
Pt arrived to the ED via EMS from home with a complaint of suicidal ideations.  Pt is a unilateral amputee.  Pt is angry and verbally aggressive in triage.

## 2015-04-02 NOTE — ED Notes (Addendum)
Security at bedside

## 2015-04-02 NOTE — ED Notes (Signed)
Pt transported to XRAY °

## 2015-04-02 NOTE — ED Notes (Signed)
Pt more calm cooperative. Daughter allowed to visit for 15 minutes. This RN provided [place] guidelines and number to call in the a.m.

## 2015-04-03 DIAGNOSIS — F10120 Alcohol abuse with intoxication, uncomplicated: Secondary | ICD-10-CM | POA: Diagnosis not present

## 2015-04-03 DIAGNOSIS — F10929 Alcohol use, unspecified with intoxication, unspecified: Secondary | ICD-10-CM | POA: Diagnosis present

## 2015-04-03 DIAGNOSIS — F22 Delusional disorders: Secondary | ICD-10-CM | POA: Diagnosis not present

## 2015-04-03 DIAGNOSIS — F1094 Alcohol use, unspecified with alcohol-induced mood disorder: Secondary | ICD-10-CM | POA: Diagnosis present

## 2015-04-03 MED ORDER — LOSARTAN POTASSIUM 50 MG PO TABS
100.0000 mg | ORAL_TABLET | Freq: Every day | ORAL | Status: DC
  Administered 2015-04-03 – 2015-04-04 (×2): 100 mg via ORAL
  Filled 2015-04-03 (×2): qty 2

## 2015-04-03 MED ORDER — POLYETHYLENE GLYCOL 3350 17 G PO PACK
17.0000 g | PACK | Freq: Every day | ORAL | Status: DC | PRN
  Filled 2015-04-03: qty 1

## 2015-04-03 MED ORDER — LOSARTAN POTASSIUM-HCTZ 100-25 MG PO TABS: 1.0000 | ORAL_TABLET | Freq: Every day | ORAL | Status: DC

## 2015-04-03 MED ORDER — METOPROLOL SUCCINATE ER 50 MG PO TB24
50.0000 mg | ORAL_TABLET | Freq: Every day | ORAL | Status: DC
  Administered 2015-04-04: 50 mg via ORAL
  Filled 2015-04-03: qty 1

## 2015-04-03 MED ORDER — ACETAMINOPHEN 325 MG PO TABS
650.0000 mg | ORAL_TABLET | Freq: Four times a day (QID) | ORAL | Status: DC | PRN
Start: 2015-04-03 — End: 2015-04-04
  Administered 2015-04-03: 650 mg via ORAL
  Filled 2015-04-03: qty 2

## 2015-04-03 MED ORDER — HYDROCODONE-ACETAMINOPHEN 5-325 MG PO TABS: 1.0000 | ORAL_TABLET | ORAL | Status: DC | PRN

## 2015-04-03 MED ORDER — VITAMIN B-1 100 MG PO TABS
100.0000 mg | ORAL_TABLET | Freq: Once | ORAL | Status: AC
  Administered 2015-04-03: 100 mg via ORAL
  Filled 2015-04-03: qty 1

## 2015-04-03 MED ORDER — ALBUTEROL SULFATE HFA 108 (90 BASE) MCG/ACT IN AERS
2.0000 | INHALATION_SPRAY | Freq: Four times a day (QID) | RESPIRATORY_TRACT | Status: DC | PRN
  Administered 2015-04-03 (×2): 2 via RESPIRATORY_TRACT
  Filled 2015-04-03: qty 6.7

## 2015-04-03 MED ORDER — ONDANSETRON 4 MG PO TBDP: 4.0000 mg | ORAL_TABLET | Freq: Four times a day (QID) | ORAL | Status: DC | PRN

## 2015-04-03 MED ORDER — LORAZEPAM 1 MG PO TABS: 1.0000 mg | ORAL_TABLET | Freq: Four times a day (QID) | ORAL | Status: DC | PRN

## 2015-04-03 MED ORDER — PANTOPRAZOLE SODIUM 40 MG PO TBEC
40.0000 mg | DELAYED_RELEASE_TABLET | Freq: Every day | ORAL | Status: DC
  Administered 2015-04-03 – 2015-04-04 (×2): 40 mg via ORAL
  Filled 2015-04-03 (×2): qty 1

## 2015-04-03 MED ORDER — AMLODIPINE BESYLATE 10 MG PO TABS
10.0000 mg | ORAL_TABLET | Freq: Every day | ORAL | Status: DC
  Administered 2015-04-03 – 2015-04-04 (×2): 10 mg via ORAL
  Filled 2015-04-03 (×2): qty 1

## 2015-04-03 MED ORDER — IMATINIB MESYLATE 100 MG PO TABS
200.0000 mg | ORAL_TABLET | Freq: Every day | ORAL | Status: DC
  Administered 2015-04-03 – 2015-04-04 (×2): 200 mg via ORAL
  Filled 2015-04-03 (×2): qty 2

## 2015-04-03 MED ORDER — ONDANSETRON HCL 4 MG PO TABS
4.0000 mg | ORAL_TABLET | Freq: Four times a day (QID) | ORAL | Status: DC
Start: 2015-04-03 — End: 2015-04-03
  Administered 2015-04-03: 4 mg via ORAL
  Filled 2015-04-03: qty 1

## 2015-04-03 MED ORDER — LORAZEPAM 1 MG PO TABS: 1.0000 mg | ORAL_TABLET | Freq: Two times a day (BID) | ORAL | Status: DC

## 2015-04-03 MED ORDER — THIAMINE HCL 100 MG/ML IJ SOLN: 100.0000 mg | Freq: Once | INTRAMUSCULAR | Status: DC

## 2015-04-03 MED ORDER — COLCHICINE 0.6 MG PO TABS
0.6000 mg | ORAL_TABLET | Freq: Every day | ORAL | Status: DC
  Administered 2015-04-03 – 2015-04-04 (×2): 0.6 mg via ORAL
  Filled 2015-04-03 (×2): qty 1

## 2015-04-03 MED ORDER — METOPROLOL TARTRATE 25 MG PO TABS
25.0000 mg | ORAL_TABLET | Freq: Two times a day (BID) | ORAL | Status: AC
  Administered 2015-04-03 (×2): 25 mg via ORAL
  Filled 2015-04-03 (×2): qty 1

## 2015-04-03 MED ORDER — ATORVASTATIN CALCIUM 20 MG PO TABS
20.0000 mg | ORAL_TABLET | Freq: Every day | ORAL | Status: DC
  Administered 2015-04-03: 20 mg via ORAL
  Filled 2015-04-03 (×2): qty 1

## 2015-04-03 MED ORDER — HYDROCHLOROTHIAZIDE 25 MG PO TABS
25.0000 mg | ORAL_TABLET | Freq: Every day | ORAL | Status: DC
  Administered 2015-04-03 – 2015-04-04 (×2): 25 mg via ORAL
  Filled 2015-04-03 (×2): qty 1

## 2015-04-03 MED ORDER — LORAZEPAM 1 MG PO TABS
1.0000 mg | ORAL_TABLET | Freq: Four times a day (QID) | ORAL | Status: DC
  Administered 2015-04-03 – 2015-04-04 (×4): 1 mg via ORAL
  Filled 2015-04-03 (×4): qty 1

## 2015-04-03 MED ORDER — LORAZEPAM 1 MG PO TABS: 1.0000 mg | ORAL_TABLET | Freq: Every day | ORAL | Status: DC

## 2015-04-03 MED ORDER — HYDROXYZINE HCL 25 MG PO TABS: 25.0000 mg | ORAL_TABLET | Freq: Four times a day (QID) | ORAL | Status: DC | PRN

## 2015-04-03 MED ORDER — LORAZEPAM 1 MG PO TABS: 1.0000 mg | ORAL_TABLET | Freq: Three times a day (TID) | ORAL | Status: DC

## 2015-04-03 MED ORDER — VITAMIN B-1 100 MG PO TABS
100.0000 mg | ORAL_TABLET | Freq: Every day | ORAL | Status: DC
  Administered 2015-04-04: 100 mg via ORAL
  Filled 2015-04-03: qty 1

## 2015-04-03 MED ORDER — POTASSIUM CHLORIDE CRYS ER 20 MEQ PO TBCR
20.0000 meq | EXTENDED_RELEASE_TABLET | Freq: Every day | ORAL | Status: DC
  Administered 2015-04-03 – 2015-04-04 (×2): 20 meq via ORAL
  Filled 2015-04-03 (×2): qty 1

## 2015-04-03 MED ORDER — SIMVASTATIN 40 MG PO TABS: 40.0000 mg | ORAL_TABLET | Freq: Every day | ORAL | Status: DC

## 2015-04-03 MED ORDER — LOPERAMIDE HCL 2 MG PO CAPS: 2.0000 mg | ORAL_CAPSULE | ORAL | Status: DC | PRN

## 2015-04-03 MED ORDER — PROCHLORPERAZINE MALEATE 5 MG PO TABS
5.0000 mg | ORAL_TABLET | Freq: Four times a day (QID) | ORAL | Status: DC | PRN
  Filled 2015-04-03: qty 2

## 2015-04-03 MED ORDER — ADULT MULTIVITAMIN W/MINERALS CH
1.0000 | ORAL_TABLET | Freq: Every day | ORAL | Status: DC
  Administered 2015-04-03 – 2015-04-04 (×2): 1 via ORAL
  Filled 2015-04-03 (×2): qty 1

## 2015-04-03 MED ORDER — LORAZEPAM 1 MG PO TABS
1.0000 mg | ORAL_TABLET | Freq: Once | ORAL | Status: AC
  Administered 2015-04-03: 1 mg via ORAL
  Filled 2015-04-03: qty 1

## 2015-04-03 MED ORDER — ESCITALOPRAM OXALATE 10 MG PO TABS
10.0000 mg | ORAL_TABLET | Freq: Every day | ORAL | Status: DC
  Administered 2015-04-03 – 2015-04-04 (×2): 10 mg via ORAL
  Filled 2015-04-03 (×2): qty 1
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
## 2015-04-03 NOTE — ED Notes (Signed)
Pt has one bag of belongings and a set of crutches behind nursing station.

## 2015-04-03 NOTE — ED Notes (Signed)
One pt belonging bag placed in locker #33. Pt's crutches placed under the nurse's station.

## 2015-04-03 NOTE — ED Notes (Signed)
Significant other came and visited patient and was asking about plan of care. Let her know we cannot give out information over the phone, but would let psychiatry know. Psychiatry recommends in patient treatment. Went back to tell pt and significant other, but she had already left. Left phone number with pt, who agreed to call family later on.

## 2015-04-03 NOTE — ED Notes (Signed)
RECHECK BP IN 1 hr/ let meds work. Pt had not taken his home meds for BP

## 2015-04-03 NOTE — [facility] Counselor (Signed)
Inpatient treatment has been recommended at this time per Dr. Harrington Challenger and Charmaine Downs, NP. TTS will seek placement Patient IVC upheld and first opinion completed.   Rosalin Hawking, LCSW Therapeutic Triage Specialist [place] 04/03/2015 3:23 PM

## 2015-04-03 NOTE — ED Notes (Signed)
Pt's daughter Draiden Olshansky called and updated on pt's condition. Pt's daughter can be reached at [phone].

## 2015-04-03 NOTE — ED Notes (Signed)
Provided patient with orange juice per his request.

## 2015-04-03 NOTE — ED Notes (Addendum)
MD Palumbo initiated emergency IVC papers.
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
## 2015-04-03 NOTE — ED Notes (Signed)
Sitter at bedside.

## 2015-04-03 NOTE — ED Notes (Signed)
Psychiatry at bedside.

## 2015-04-03 NOTE — ED Notes (Addendum)
Per Secretary Jody she has faxed the IVC papers from [facility] however, first time she called the magistrate reports that they didn't receive them despite confirmation that fax was sent.They report it appears the papers were sent during their shift change and nothing was received. Second time the secretary called magistrate reports that he would like more detail therefore the request to IVC patient will be denied. The IVC paper reports that patient hates white people and that he's going to kill them all. Pt reports he has access to a gun at home and that he will kill himself. Secretary Jody speaking with MD Palumbo about emergency IVC papers.

## 2015-04-03 NOTE — ED Notes (Signed)
Pt is resting comfortably in bed with safety sitter at the bedside.  He denies pain and states that he has no needs at this time. No acute distress.

## 2015-04-03 NOTE — BH Assessment (Signed)
Writer faxed referrals to the following facilities:  [facility]  [facility]

## 2015-04-03 NOTE — ED Notes (Signed)
TTS at bedside. 

## 2015-04-03 NOTE — ED Notes (Addendum)
Pt requesting to go home. This RN made patient aware that he will not be able to go home at this time. He was also made aware that he is awaiting to speak with psychiatry during their rounds this morning. Warm blankets were provided and patient drifting back off to sleep. Will continue to monitor patient.

## 2015-04-03 NOTE — ED Notes (Signed)
Pt telling MD that he hates white people and that he is going to kill them all. He reports that he has access to a gun at home. Pt also reports that he is going to shoot himself.

## 2015-04-03 NOTE — Progress Notes (Addendum)
Patient has been referred to the following facilities: [facility]  CSW will follow up and continue to seek placement.   Lucius Conn, [place] Worker [place] [phone]

## 2015-04-04 DIAGNOSIS — F22 Delusional disorders: Secondary | ICD-10-CM | POA: Diagnosis not present

## 2015-04-04 DIAGNOSIS — F10120 Alcohol abuse with intoxication, uncomplicated: Secondary | ICD-10-CM | POA: Diagnosis not present

## 2015-04-04 NOTE — Consult Note (Signed)
Psychiatric Specialty Exam: Physical Exam  ROS  Blood pressure 151/112, pulse 85, temperature 98.7 F (37.1 C), temperature source Oral, resp. rate 20, SpO2 100 %.There is no weight on file to calculate BMI.  General Appearance: Casual and Disheveled  Eye Contact::  Good  Speech:  Clear and Coherent and Normal Rate  Volume:  Normal  Mood:  Euthymic  Affect:  Congruent  Thought Process:  Coherent, Goal Directed and Intact  Orientation:  Full (Time, Place, and Person)  Thought Content:  WDL  Suicidal Thoughts:  No  Homicidal Thoughts:  No  Memory:  Immediate;   Good Recent;   Good Remote;   Good  Judgement:  Good  Insight:  Good  Psychomotor Activity:  Normal  Concentration:  Good  Recall:  [name] of Knowledge:  Fair  Language:  Good  Akathisia:  NA  Handed:  Right  AIMS (if indicated):     Assets:  Desire for Improvement  ADL's:  Intact  Cognition:  WNL  Sleep:      Patient was re-evaluated today and is calm, cooperative and willingly answered all questions correctly.  Patient vehemently denies having a gun and states that he has no means of shooting himself.  He denies SI/HI/AVH and states he does not think he has issues with Alcohol.  Patient reports that his only medical problem is HTN and he is compliant with his medications.  Patient declined offer for outpatient Alcohol rehab assistance.  Patient is discharged home.  Alcohol intoxication ([place])   Plan: Discharge home  Charmaine Downs   [facility] Patient seen and I agree with treatment and plan  Griffin Dakin.D.

## 2015-04-04 NOTE — BH Assessment (Signed)
Pt was reassessed by TTS.   Pt denies SI, HI, A/V Hallucinations, delusions, substance abuse, depression and anxiety. Pt was able to contract for safety and stated he would like to go home. Pt slept well and ate well.  Pt denied having a gun in his house or access to any other weapon.   Pt stated that he felt like is actions from the day before were dumb.   Ruben Reason, MA, LPCA, [facility]

## 2015-04-04 NOTE — BHH Suicide Risk Assessment (Signed)
Suicide Risk Assessment  Discharge Assessment   [facility] Discharge Suicide Risk Assessment   Demographic Factors:  Male, Age 69 or older, Low socioeconomic status and Unemployed  Total Time spent with patient: 20 minutes  Musculoskeletal: Strength & Muscle Tone: uses crutches, RT AKA NOTED Gait & Station: see above Patient leans: uses Crutches  Psychiatric Specialty Exam:     Blood pressure 151/112, pulse 85, temperature 98.7 F (37.1 C), temperature source Oral, resp. rate 20, SpO2 100 %.There is no weight on file to calculate BMI.  General Appearance: Casual and Disheveled  Eye Contact::  Good  Speech:  Clear and Coherent and Normal Rate  Volume:  Normal  Mood:  Euthymic  Affect:  Congruent  Thought Process:  Coherent, Goal Directed and Intact  Orientation:  Full (Time, Place, and Person)  Thought Content:  WDL  Suicidal Thoughts:  No  Homicidal Thoughts:  No  Memory:  Immediate;   Good Recent;   Good Remote;   Good  Judgement:  Good  Insight:  Good  Psychomotor Activity:  Normal  Concentration:  Good  Recall:  [place] of Knowledge:  Fair  Language:  Good  Akathisia:  NA  Handed:  Right  AIMS (if indicated):     Assets:  Desire for Improvement  ADL's:  Intact  Cognition:  WNL        Has this patient used any form of tobacco in the last 30 days? (Cigarettes, Smokeless Tobacco, Cigars, and/or Pipes) N/A  Mental Status Per Nursing Assessment::   On Admission:     Current Mental Status by Physician: NA  Loss Factors: NA  Historical Factors: NA  Risk Reduction Factors:   Living with another person, especially a relative and Positive therapeutic relationship  Continued Clinical Symptoms:  Severe Anxiety and/or Agitation  Cognitive Features That Contribute To Risk:  None    Suicide Risk:  Minimal: No identifiable suicidal ideation.  Patients presenting with no risk factors but with morbid ruminations; may be classified as minimal risk based on the  severity of the depressive symptoms  Principal Problem: Alcohol intoxication [facility]) Discharge Diagnoses:  Patient Active Problem List   Diagnosis Date Noted  . Alcohol use, 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[place]) [F10.94] 04/03/2015    Priority: High  . Alcohol intoxication ([place]) [F10.129] 04/03/2015    Priority: Medium  . Poor dentition [K08.9] 02/16/2015  . Essential hypertension [I10] 02/15/2015  . Tobacco abuse [Z72.0] 02/15/2015  . Cellulitis of submandibular region [K12.2] 02/15/2015  . 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 Painful orthopaedic hardware [T84.84XA] 07/03/2012  . 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[place]) [C92.10] 01/27/2012  . 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

## 2015-04-04 NOTE — ED Notes (Signed)
Patient called a friend who is supposed to come and give him a ride home.  Waiting for his ride with sitter at bedside.

## 2015-05-12 ENCOUNTER — Other Ambulatory Visit: Payer: Self-pay | Admitting: *Deleted

## 2015-05-12 ENCOUNTER — Telehealth: Payer: Self-pay | Admitting: Oncology

## 2015-05-12 ENCOUNTER — Ambulatory Visit (HOSPITAL_COMMUNITY)
Admission: RE | Admit: 2015-05-12 | Discharge: 2015-05-12 | Disposition: A | Discharge status: Home / Self Care | Payer: Medicare Other | Source: Ambulatory Visit | Attending: Oncology | Admitting: Oncology

## 2015-05-12 ENCOUNTER — Other Ambulatory Visit (HOSPITAL_BASED_OUTPATIENT_CLINIC_OR_DEPARTMENT_OTHER): Payer: Medicare Other

## 2015-05-12 ENCOUNTER — Ambulatory Visit (HOSPITAL_BASED_OUTPATIENT_CLINIC_OR_DEPARTMENT_OTHER): Payer: Medicare Other | Admitting: Oncology

## 2015-05-12 VITALS — BP 157/90 | HR 77 | Temp 98.4°F | Resp 17 | Ht 69.0 in | Wt 160.5 lb

## 2015-05-12 DIAGNOSIS — R05 Cough: Secondary | ICD-10-CM | POA: Diagnosis present

## 2015-05-12 DIAGNOSIS — Z856 Personal history of leukemia: Secondary | ICD-10-CM | POA: Diagnosis not present

## 2015-05-12 DIAGNOSIS — R0602 Shortness of breath: Secondary | ICD-10-CM | POA: Diagnosis present

## 2015-05-12 DIAGNOSIS — C9210 Chronic myeloid leukemia, BCR/ABL-positive, not having achieved remission: Secondary | ICD-10-CM

## 2015-05-12 DIAGNOSIS — F17200 Nicotine dependence, unspecified, uncomplicated: Secondary | ICD-10-CM | POA: Diagnosis not present

## 2015-05-12 LAB — COMPREHENSIVE METABOLIC PANEL
ALBUMIN: 3.6 g/dL (ref 3.5–5.0)
ALK PHOS: 91 U/L (ref 40–150)
ALT: 10 U/L (ref 0–55)
AST: 17 U/L (ref 5–34)
Anion Gap: 9 mEq/L (ref 3–11)
BUN: 11.3 mg/dL (ref 7.0–26.0)
CALCIUM: 9.2 mg/dL (ref 8.4–10.4)
CHLORIDE: 104 meq/L (ref 98–109)
CO2: 29 mEq/L (ref 22–29)
CREATININE: 1.3 mg/dL (ref 0.7–1.3)
EGFR: 67 mL/min/{1.73_m2} — ABNORMAL LOW (ref >90)
GLUCOSE: 106 mg/dL (ref 70–140)
POTASSIUM: 3.2 meq/L — AB (ref 3.5–5.1)
SODIUM: 143 meq/L (ref 136–145)
Total Bilirubin: <0.3 mg/dL (ref 0.20–1.20)
Total Protein: 6.5 g/dL (ref 6.4–8.3)

## 2015-05-12 LAB — CBC WITH DIFFERENTIAL/PLATELET
BASO%: 1.1 % (ref 0.0–2.0)
BASOS ABS: 0.1 10*3/uL (ref 0.0–0.1)
EOS ABS: 0.5 10*3/uL (ref 0.0–0.5)
EOS%: 7.2 % — ABNORMAL HIGH (ref 0.0–7.0)
HCT: 40.7 % (ref 38.4–49.9)
HGB: 13 g/dL (ref 13.0–17.1)
LYMPH%: 25.9 % (ref 14.0–49.0)
MCH: 26.4 pg — AB (ref 27.2–33.4)
MCHC: 32.1 g/dL (ref 32.0–36.0)
MCV: 82.4 fL (ref 79.3–98.0)
MONO#: 0.7 10*3/uL (ref 0.1–0.9)
MONO%: 10.7 % (ref 0.0–14.0)
NEUT#: 3.4 10*3/uL (ref 1.5–6.5)
NEUT%: 55.1 % (ref 39.0–75.0)
PLATELETS: 199 10*3/uL (ref 140–400)
RBC: 4.94 10*6/uL (ref 4.20–5.82)
RDW: 16.1 % — ABNORMAL HIGH (ref 11.0–14.6)
WBC: 6.2 10*3/uL (ref 4.0–10.3)
lymph#: 1.6 10*3/uL (ref 0.9–3.3)

## 2015-05-12 MED ORDER — POTASSIUM CHLORIDE CRYS ER 20 MEQ PO TBCR: EXTENDED_RELEASE_TABLET | ORAL | Status: DC

## 2015-05-12 MED ORDER — LEVOFLOXACIN 500 MG PO TABS: 500.0000 mg | ORAL_TABLET | Freq: Every day | ORAL | Status: DC

## 2015-05-12 NOTE — Progress Notes (Signed)
[place] OFFICE PROGRESS NOTE   Diagnosis: CML  INTERVAL HISTORY:   Mr. Trombly returns as scheduled. He continues [place]. Diarrhea is relieved with Lomotil. He complains of a cough for the past week. No fever. He has noted an increase over his baseline dyspnea. He continues smoking. He is not taking potassium.  Objective:  Vital signs in last 24 hours:  Blood pressure 157/90, pulse 77, temperature 98.4 F (36.9 C), temperature source Oral, resp. rate 17, height 5\' 9"  (1.753 m), weight 160 lb 8 oz (72.802 kg), SpO2 99 %.    HEENT: No thrush or ulcers Lymphatics: No cervical or supraclavicular nodes Resp: Rhonchi at the right lower posterior chest, no respiratory distress Cardio: Regular rate and rhythm GI: No hepatosplenomegaly Vascular: No left leg edema   Lab Results:  Lab Results  Component Value Date   WBC 6.2 05/12/2015   HGB 13.0 05/12/2015   HCT 40.7 05/12/2015   MCV 82.4 05/12/2015   PLT 199 05/12/2015   NEUTROABS 3.4 05/12/2015   potassium 3.2, BUN 11.3, creatinine 1.3   BCR/ABL on 03/28/2015-2.91%  Medications: I have reviewed the patient's current medications.  Assessment/Plan: 1. Chronic myelogenous leukemia, diagnosed in January of 2009. He remains in hematologic remission. He is taking Gleevec at a dose of 200 mg daily. The peripheral blood PCR was markedly increased in May 2013, likely reflecting medical noncompliance. The peripheral blood PCR was significantly improved on 11/07/2011. The peripheral blood PCR was further improved on 01/31/2012. The peripheral blood PCR was increased on 04/27/2012; stable on 11/12/2012, improved on 02/12/2013; increased on 04/29/2013; increased on 07/10/2013; improved on 09/16/2013; improved on 10/28/2013; increased 02/17/2014; slightly improved 03/28/2014. Stable 04/29/2014. Increased 05/30/2014. Improved 07/26/2014.  2. 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 3. Status post C3-C4, C4-C5, anterior  cervical diskectomy and fusion with allograft and plating 09/30/2010. 4. 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 5. Depression, maintained on Lexapro. 6. Diarrhea, ? related to chronic pancreatitis versus Gleevec. He takes Lomotil as needed.  7. Indurated facial skin lesion 11/20/2007 with a history of MRSA skin infection of the submental area in May 2008. The induration resolved with doxycycline. 8. Left knee arthroscopy 05/25/2007. 9. Postoperative left knee effusion/pain, likely related to gout. 10. History of gout.  11. Chronic pancreatitis. 12. Status post right above-the-knee amputation. 13. MRSA infection of the submental area May 2008. 14. History of tobacco, alcohol, and cocaine use. 15. History of coronary artery disease. 16. "Shotty" lymphadenopathy of the neck, axilla, and left groin in 2009.  17. History of a microcytic anemia.  18. History of a right olecranon bursa lesion, ? gouty tophus. 19. 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 He previously took AndroGel. 20. History of anemia secondary to chronic disease and Gleevec therapy.  21. Anorexia-potentially related to [place]. He reports Medicaid would not pay for Megace.  22. Chronic left knee and left foot pain. He takes hydrocodone as needed 23. Status post removal of a left knee screw 07/03/2012. 24. History of Hypokalemia. Likely related to diarrhea. 25. Status post left foot surgery. 26. Pulsatile fullness right neck. Evaluated by vascular surgery status post CT angiogram 03/07/2014 with no carotid artery aneurysm identified. 27. Cough-abnormal lung exam 05/12/2015, we will prescribe Levaquin and obtain a chest x-ray today.   Disposition:  Mr. Chairs appears stable from a hematologic standpoint. He will continue Gleevec and we will follow-up on the peripheral blood PCR from today. I  recommended he resume the potassium supplement. He will begin  Levaquin and we will check a chest x-ray today. He will seek medical attention for increased dyspnea.  Mr. Giachetti will return for an office and lab visit in 6 weeks. [name], MD  05/12/2015  11:19 AM

## 2015-05-12 NOTE — Telephone Encounter (Signed)
per pof to sch pt appt-gave pt copy of avs °

## 2015-05-13 ENCOUNTER — Telehealth: Payer: Self-pay | Admitting: *Deleted

## 2015-05-13 NOTE — Telephone Encounter (Signed)
-----   Message from Ladell Pier, MD sent at 05/12/2015  8:09 PM EST ----- Please call patient, cxr negative for pneumonia, call for increased dyspnea, f/u as scheduled

## 2015-05-14 NOTE — Telephone Encounter (Signed)
"  Dr. Gearldine Shown nurse called yesterday.  Could you tell me why?  I also need to change my F/U appointment to the evening.  I don't feel well in the mornings."  Informed him CXR normal.  Audible congested cough heard with this call but he denies any changes in breathing or s.o.b.  Asked that he call office with any s.o.b or difficult, strained heavy breathing changes.  P.O.F. Generated for scheduler to change appointment to later on 06-23-2015.  Return number for appointment change information [phone].

## 2015-05-15 ENCOUNTER — Telehealth: Payer: Self-pay | Admitting: Nurse Practitioner

## 2015-05-15 NOTE — Telephone Encounter (Signed)
Aware of new times on 2/14

## 2015-06-12 ENCOUNTER — Other Ambulatory Visit: Payer: Self-pay | Admitting: Nurse Practitioner

## 2015-06-23 ENCOUNTER — Other Ambulatory Visit: Payer: Self-pay

## 2015-06-23 ENCOUNTER — Ambulatory Visit: Payer: Self-pay | Admitting: Nurse Practitioner

## 2015-07-07 ENCOUNTER — Telehealth: Payer: Self-pay | Admitting: Nurse Practitioner

## 2015-07-07 NOTE — Telephone Encounter (Signed)
pt cld to r./s no show appt from 2/14-gave r/s time & date for 3/7

## 2015-07-14 ENCOUNTER — Ambulatory Visit (HOSPITAL_BASED_OUTPATIENT_CLINIC_OR_DEPARTMENT_OTHER): Payer: Medicare Other | Admitting: Nurse Practitioner

## 2015-07-14 ENCOUNTER — Other Ambulatory Visit (HOSPITAL_BASED_OUTPATIENT_CLINIC_OR_DEPARTMENT_OTHER): Payer: Medicare Other

## 2015-07-14 ENCOUNTER — Telehealth: Payer: Self-pay | Admitting: Oncology

## 2015-07-14 VITALS — BP 139/87 | HR 81 | Temp 98.2°F | Resp 18 | Ht 69.0 in | Wt 159.0 lb

## 2015-07-14 DIAGNOSIS — C9210 Chronic myeloid leukemia, BCR/ABL-positive, not having achieved remission: Secondary | ICD-10-CM

## 2015-07-14 DIAGNOSIS — C9211 Chronic myeloid leukemia, BCR/ABL-positive, in remission: Secondary | ICD-10-CM

## 2015-07-14 DIAGNOSIS — R197 Diarrhea, unspecified: Secondary | ICD-10-CM

## 2015-07-14 LAB — CBC WITH DIFFERENTIAL/PLATELET
BASO%: 0.3 % (ref 0.0–2.0)
Basophils Absolute: 0 10*3/uL (ref 0.0–0.1)
EOS%: 9.3 % — AB (ref 0.0–7.0)
Eosinophils Absolute: 0.6 10*3/uL — ABNORMAL HIGH (ref 0.0–0.5)
HCT: 38.8 % (ref 38.4–49.9)
HEMOGLOBIN: 12.7 g/dL — AB (ref 13.0–17.1)
LYMPH%: 30.1 % (ref 14.0–49.0)
MCH: 26.7 pg — ABNORMAL LOW (ref 27.2–33.4)
MCHC: 32.7 g/dL (ref 32.0–36.0)
MCV: 81.7 fL (ref 79.3–98.0)
MONO#: 0.5 10*3/uL (ref 0.1–0.9)
MONO%: 8.7 % (ref 0.0–14.0)
NEUT%: 51.6 % (ref 39.0–75.0)
NEUTROS ABS: 3.2 10*3/uL (ref 1.5–6.5)
Platelets: 172 10*3/uL (ref 140–400)
RBC: 4.75 10*6/uL (ref 4.20–5.82)
RDW: 15.2 % — AB (ref 11.0–14.6)
WBC: 6.1 10*3/uL (ref 4.0–10.3)
lymph#: 1.8 10*3/uL (ref 0.9–3.3)

## 2015-07-14 LAB — COMPREHENSIVE METABOLIC PANEL
ALBUMIN: 3.6 g/dL (ref 3.5–5.0)
ALK PHOS: 78 U/L (ref 40–150)
ALT: 15 U/L (ref 0–55)
AST: 15 U/L (ref 5–34)
Anion Gap: 9 mEq/L (ref 3–11)
BILIRUBIN TOTAL: 0.53 mg/dL (ref 0.20–1.20)
BUN: 15 mg/dL (ref 7.0–26.0)
CO2: 26 meq/L (ref 22–29)
Calcium: 9.1 mg/dL (ref 8.4–10.4)
Chloride: 109 mEq/L (ref 98–109)
Creatinine: 1.3 mg/dL (ref 0.7–1.3)
EGFR: 63 mL/min/{1.73_m2} — ABNORMAL LOW (ref >90)
GLUCOSE: 116 mg/dL (ref 70–140)
POTASSIUM: 3.5 meq/L (ref 3.5–5.1)
SODIUM: 145 meq/L (ref 136–145)
TOTAL PROTEIN: 6.6 g/dL (ref 6.4–8.3)

## 2015-07-14 MED ORDER — DIPHENOXYLATE-ATROPINE 2.5-0.025 MG PO TABS: ORAL_TABLET | ORAL | Status: DC

## 2015-07-14 NOTE — Telephone Encounter (Signed)
Gave and printed appt sched and avs for pt for may....emailed ccs to get appt

## 2015-07-14 NOTE — Progress Notes (Addendum)
[name] returns for follow-up. He continues [place]. He has periodic diarrhea. He takes Lomotil as needed. He has occasional nausea/vomiting. No rash. He complains of "arthritis pain". His main complaint today is of a "knot" in the rectum. It has been present for "a long time". He has intermittent slight rectal bleeding.  Objective:  Vital signs in last 24 hours:  Blood pressure 139/87, pulse 81, temperature 98.2 F (36.8 C), temperature source Oral, resp. rate 18, height 5\' 9"  (1.753 m), weight 159 lb (72.122 kg), SpO2 99 %.    HEENT: No thrush or ulcers. Resp: Lungs clear bilaterally. Cardio: Regular rate and rhythm. GI: Abdomen soft and nontender. No organomegaly. Vascular: No left leg edema. Rectal: Small external hemorrhoids. No rectal mass. Nodular lesion left inferior anal margin (rectal exam performed by Dr. [name]).  Lab Results:  Lab Results  Component Value Date   WBC 6.1 07/14/2015   HGB 12.7* 07/14/2015   HCT 38.8 07/14/2015   MCV 81.7 07/14/2015   PLT 172 07/14/2015   NEUTROABS 3.2 07/14/2015    Imaging:  No results found.  Medications: I have reviewed the patient's current medications.  Assessment/Plan: 1. Chronic myelogenous leukemia, diagnosed in January of 2009. He remains in hematologic remission. He is taking Gleevec at a dose of 200 mg daily. The peripheral blood PCR was markedly increased in May 2013, likely reflecting medical noncompliance. The peripheral blood PCR was significantly improved on 11/07/2011. The peripheral blood PCR was further improved on 01/31/2012. The peripheral blood PCR was increased on 04/27/2012; stable on 11/12/2012, improved on 02/12/2013; increased on 04/29/2013; increased on 07/10/2013; improved on 09/16/2013; improved on 10/28/2013; increased 02/17/2014; slightly improved 03/28/2014. Stable 04/29/2014. Increased 05/30/2014.  Improved 07/26/2014.  2. 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 3. Status post C3-C4, C4-C5, anterior cervical diskectomy and fusion with allograft and plating 09/30/2010. 4. 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 5. Depression, maintained on Lexapro. 6. Diarrhea, ? related to chronic pancreatitis versus Gleevec. He takes Lomotil as needed.  7. Indurated facial skin lesion 11/20/2007 with a history of MRSA skin infection of the submental area in May 2008. The induration resolved with doxycycline. 8. Left knee arthroscopy 05/25/2007. 9. Postoperative left knee effusion/pain, likely related to gout. 10. History of gout.  11. Chronic pancreatitis. 12. Status post right above-the-knee amputation. 13. MRSA infection of the submental area May 2008. 14. History of tobacco, alcohol, and cocaine use. 15. History of coronary artery disease. 16. "Shotty" lymphadenopathy of the neck, axilla, and left groin in 2009.  17. History of a microcytic anemia.  18. History of a right olecranon bursa lesion, ? gouty tophus. 19. 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 He previously took AndroGel. 20. History of anemia secondary to chronic disease and Gleevec therapy.  21. Anorexia-potentially related to [place]. He reports Medicaid would not pay for Megace.  22. Chronic left knee and left foot pain. He takes hydrocodone as needed 23. Status post removal of a left knee screw 07/03/2012. 24. History of Hypokalemia. Likely related to diarrhea. 25. Status post left foot surgery. 26. Pulsatile fullness right neck. Evaluated by vascular surgery status post CT angiogram 03/07/2014 with no carotid artery aneurysm identified. 27. Cough-abnormal lung exam 05/12/2015, we will prescribe Levaquin and obtain a chest x-ray today.   Disposition: [name] remains stable from a hematologic standpoint. He will  continue Gleevec. We will follow-up on  the peripheral blood PCR from today.  We made a referral to Dr. Leighton Ruff to evaluate the nodular lesion at the left inferior anal margin.  He will return for a follow-up visit in 8 weeks.  Patient seen with Dr. [name].    [name] ANP/GNP-BC   07/14/2015  3:12 PM  This was a shared visit with [name]. Mr. Medica was interviewed and examined. On rectal exam he is tender at the inferior aspect of the prostate without a discrete nodule. There are external/internal hemorrhoids. Slightly thickened and tender area at the inferior aspect of the left anal margin. We will refer him to Dr. Marcello Moores to evaluate the anal margin and rectum.  Julieanne Manson, M.D.

## 2015-07-27 ENCOUNTER — Ambulatory Visit (HOSPITAL_BASED_OUTPATIENT_CLINIC_OR_DEPARTMENT_OTHER): Payer: Medicare Other | Admitting: Nurse Practitioner

## 2015-07-27 VITALS — BP 165/92 | HR 78 | Temp 98.0°F | Resp 18 | Ht 69.0 in | Wt 160.0 lb

## 2015-07-27 DIAGNOSIS — C9210 Chronic myeloid leukemia, BCR/ABL-positive, not having achieved remission: Secondary | ICD-10-CM

## 2015-07-27 MED ORDER — PROCHLORPERAZINE MALEATE 5 MG PO TABS: 5.0000 mg | ORAL_TABLET | Freq: Four times a day (QID) | ORAL | Status: DC | PRN
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
## 2015-08-02 ENCOUNTER — Other Ambulatory Visit: Payer: Self-pay | Admitting: Oncology

## 2015-09-01 ENCOUNTER — Other Ambulatory Visit: Payer: Self-pay | Admitting: *Deleted

## 2015-09-01 ENCOUNTER — Other Ambulatory Visit: Payer: Self-pay | Admitting: Oncology

## 2015-09-08 ENCOUNTER — Other Ambulatory Visit (HOSPITAL_BASED_OUTPATIENT_CLINIC_OR_DEPARTMENT_OTHER): Payer: Medicare Other

## 2015-09-08 ENCOUNTER — Ambulatory Visit (HOSPITAL_BASED_OUTPATIENT_CLINIC_OR_DEPARTMENT_OTHER): Payer: Medicare Other | Admitting: Oncology

## 2015-09-08 VITALS — BP 178/93 | HR 82 | Temp 98.2°F | Resp 17 | Ht 69.0 in | Wt 163.0 lb

## 2015-09-08 DIAGNOSIS — C9210 Chronic myeloid leukemia, BCR/ABL-positive, not having achieved remission: Secondary | ICD-10-CM

## 2015-09-08 LAB — CBC WITH DIFFERENTIAL/PLATELET
BASO%: 0.5 % (ref 0.0–2.0)
Basophils Absolute: 0 10*3/uL (ref 0.0–0.1)
EOS%: 7.6 % — AB (ref 0.0–7.0)
Eosinophils Absolute: 0.4 10*3/uL (ref 0.0–0.5)
HCT: 38.9 % (ref 38.4–49.9)
HGB: 12.8 g/dL — ABNORMAL LOW (ref 13.0–17.1)
LYMPH%: 29.4 % (ref 14.0–49.0)
MCH: 26.4 pg — AB (ref 27.2–33.4)
MCHC: 32.9 g/dL (ref 32.0–36.0)
MCV: 80.4 fL (ref 79.3–98.0)
MONO#: 0.5 10*3/uL (ref 0.1–0.9)
MONO%: 8.6 % (ref 0.0–14.0)
NEUT#: 3 10*3/uL (ref 1.5–6.5)
NEUT%: 53.9 % (ref 39.0–75.0)
PLATELETS: 161 10*3/uL (ref 140–400)
RBC: 4.83 10*6/uL (ref 4.20–5.82)
RDW: 16.2 % — ABNORMAL HIGH (ref 11.0–14.6)
WBC: 5.6 10*3/uL (ref 4.0–10.3)
lymph#: 1.7 10*3/uL (ref 0.9–3.3)

## 2015-09-08 LAB — COMPREHENSIVE METABOLIC PANEL
ALT: 49 U/L (ref 0–55)
ANION GAP: 10 meq/L (ref 3–11)
AST: 40 U/L — ABNORMAL HIGH (ref 5–34)
Albumin: 3.6 g/dL (ref 3.5–5.0)
Alkaline Phosphatase: 81 U/L (ref 40–150)
BUN: 11.7 mg/dL (ref 7.0–26.0)
CHLORIDE: 104 meq/L (ref 98–109)
CO2: 28 meq/L (ref 22–29)
CREATININE: 1.3 mg/dL (ref 0.7–1.3)
Calcium: 9.2 mg/dL (ref 8.4–10.4)
EGFR: 62 mL/min/{1.73_m2} — ABNORMAL LOW (ref >90)
Glucose: 111 mg/dl (ref 70–140)
Potassium: 2.8 mEq/L — CL (ref 3.5–5.1)
Sodium: 142 mEq/L (ref 136–145)
Total Bilirubin: 0.42 mg/dL (ref 0.20–1.20)
Total Protein: 6.4 g/dL (ref 6.4–8.3)

## 2015-09-08 MED ORDER — POTASSIUM CHLORIDE CRYS ER 20 MEQ PO TBCR: EXTENDED_RELEASE_TABLET | ORAL | Status: DC

## 2015-09-08 NOTE — Progress Notes (Signed)
Received critical potassium result. Pt reports he has been taking potassium once daily. Has 1-2 loose stools per day while taking Lomotil. Dr. [name] informed of critical result. Pt instructed to resume twice daily on potassium. Rx sent to pharmacy.

## 2015-09-08 NOTE — Progress Notes (Signed)
[place] OFFICE PROGRESS NOTE   Diagnosis:  CML  INTERVAL HISTORY:    [name] returns as scheduled. He continues Gleevec at a dose of 200 mg daily. He continues to have diarrhea twice daily. He takes Lomotil. He is taking potassium  Only once daily. He request pain medication for "bursitis "at the elbows. He has gained weight.  he is smoking 1 pack of cigarettes per day. Objective:  Vital signs in last 24 hours:  Blood pressure 178/93, pulse 82, temperature 98.2 F (36.8 C), temperature source Oral, resp. rate 17, height 5\' 9"  (1.753 m), weight 163 lb (73.936 kg), SpO2 100 %.    HEENT:  No thrush or ulcers Resp:  Course rhonchi at the right base, no respiratory distress Cardio:  Regular rate and rhythm GI:  Nontender, no hepatosplenomegaly  neurologic: The motor exam is intact at the arms bilaterally   Lab Results:  Lab Results  Component Value Date   WBC 5.6 09/08/2015   HGB 12.8* 09/08/2015   HCT 38.9 09/08/2015   MCV 80.4 09/08/2015   PLT 161 09/08/2015   NEUTROABS 3.0 09/08/2015   potassium 2.8, BUN 11.7, creatinine 1.3   Peripheral blood PCR on 07/14/2015-3.2%  Medications: I have reviewed the patient's current medications.  Assessment/Plan: 1. Chronic myelogenous leukemia, diagnosed in January of 2009. He remains in hematologic remission. He is taking Gleevec at a dose of 200 mg daily. The peripheral blood PCR was markedly increased in May 2013, likely reflecting medical noncompliance. The peripheral blood PCR was significantly improved on 11/07/2011. The peripheral blood PCR was further improved on 01/31/2012. The peripheral blood PCR was increased on 04/27/2012; stable on 11/12/2012, improved on 02/12/2013; increased on 04/29/2013; increased on 07/10/2013; improved on 09/16/2013; improved on 10/28/2013; increased 02/17/2014; slightly improved 03/28/2014. Stable 04/29/2014. Increased 05/30/2014. Improved 07/26/2014. Increased  07/14/2015. 2. 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 3. Status post C3-C4, C4-C5, anterior cervical diskectomy and fusion with allograft and plating 09/30/2010. 4. 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 5. Depression 6. Diarrhea, ? related to chronic pancreatitis versus Gleevec. He takes Lomotil as needed.  7. Indurated facial skin lesion 11/20/2007 with a history of MRSA skin infection of the submental area in May 2008. The induration resolved with doxycycline. 8. Left knee arthroscopy 05/25/2007. 9. Postoperative left knee effusion/pain, likely related to gout. 10. History of gout.  11. Chronic pancreatitis. 12. Status post right above-the-knee amputation. 13. MRSA infection of the submental area May 2008. 14. History of tobacco, alcohol, and cocaine use. 15. History of coronary artery disease. 16. "Shotty" lymphadenopathy of the neck, axilla, and left groin in 2009.  17. History of a microcytic anemia.  18. History of a right olecranon bursa lesion, ? gouty tophus. 19. 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 He previously took AndroGel. 20. History of anemia secondary to chronic disease and Gleevec therapy.  21.  history ofAnorexia-potentially related to [place]. He reports Medicaid would not pay for Megace.  22. Chronic left knee and left foot pain. d 64. Status post removal of a left knee screw 07/03/2012. 24. History of Hypokalemia. Likely related to diarrhea. 25. Status post left foot surgery. 26. Pulsatile fullness right neck. Evaluated by vascular surgery status post CT angiogram 03/07/2014 with no carotid artery aneurysm identified. 27. Cough-abnormal lung exam 05/12/2015. He completed a course of Levaquin. Chest xray negative.    Disposition:   [name] is stable from a hematologic  standpoint. He has been maintained on Gleevec since 2009. He remains in hematologic remission, but he has not  achieved cytogenetic remission. He will continue Gleevec at a dose of 200 mg per now. He developed more severe diarrhea with Gleevec at a higher dose. The plan is to make a change to nilotinib if the peripheral blood PCR is consistently higher.  He will increase the potassium to twice daily. [name] will return for an office and lab visit in one month.  [name], MD  09/08/2015  3:09 PM

## 2015-09-14 ENCOUNTER — Telehealth: Payer: Self-pay | Admitting: *Deleted

## 2015-09-14 NOTE — Telephone Encounter (Signed)
Informed pt that BCR result looked better, continue Gleevec at current dose. He voiced understanding. Next appointment confirmed.

## 2015-09-21 ENCOUNTER — Telehealth: Payer: Self-pay | Admitting: *Deleted

## 2015-09-21 NOTE — Telephone Encounter (Signed)
Returned call to pt: instructed him to contact PCP for letter, per Dr. [name]. He agreed to do so.

## 2015-09-21 NOTE — Telephone Encounter (Signed)
Patient called stating that he needs a note from the physician. This should state that he needs to move due to health reasons. Patient is currently on [address] and has a home to move into, but before the change can be made, a note has to done. Message sent to RN Tanya/MD [name].

## 2015-09-22 ENCOUNTER — Other Ambulatory Visit: Payer: Self-pay | Admitting: Oncology

## 2015-10-08 ENCOUNTER — Other Ambulatory Visit (HOSPITAL_BASED_OUTPATIENT_CLINIC_OR_DEPARTMENT_OTHER): Payer: Medicare Other

## 2015-10-08 ENCOUNTER — Telehealth: Payer: Self-pay | Admitting: Oncology

## 2015-10-08 ENCOUNTER — Ambulatory Visit (HOSPITAL_BASED_OUTPATIENT_CLINIC_OR_DEPARTMENT_OTHER): Payer: Medicare Other | Admitting: Nurse Practitioner

## 2015-10-08 VITALS — BP 145/84 | HR 78 | Temp 98.3°F | Resp 20 | Ht 69.0 in | Wt 158.3 lb

## 2015-10-08 DIAGNOSIS — Z720 Tobacco use: Secondary | ICD-10-CM

## 2015-10-08 DIAGNOSIS — C9210 Chronic myeloid leukemia, BCR/ABL-positive, not having achieved remission: Secondary | ICD-10-CM | POA: Diagnosis present

## 2015-10-08 LAB — COMPREHENSIVE METABOLIC PANEL
ALT: 13 U/L (ref 0–55)
ANION GAP: 9 meq/L (ref 3–11)
AST: 16 U/L (ref 5–34)
Albumin: 3.5 g/dL (ref 3.5–5.0)
Alkaline Phosphatase: 77 U/L (ref 40–150)
BUN: 13.8 mg/dL (ref 7.0–26.0)
CHLORIDE: 108 meq/L (ref 98–109)
CO2: 27 meq/L (ref 22–29)
CREATININE: 1.7 mg/dL — AB (ref 0.7–1.3)
Calcium: 9.3 mg/dL (ref 8.4–10.4)
EGFR: 45 mL/min/{1.73_m2} — ABNORMAL LOW (ref >90)
Glucose: 101 mg/dl (ref 70–140)
Potassium: 3.4 mEq/L — ABNORMAL LOW (ref 3.5–5.1)
Sodium: 144 mEq/L (ref 136–145)
Total Bilirubin: 0.35 mg/dL (ref 0.20–1.20)
Total Protein: 6.1 g/dL — ABNORMAL LOW (ref 6.4–8.3)

## 2015-10-08 LAB — CBC WITH DIFFERENTIAL/PLATELET
BASO%: 1.2 % (ref 0.0–2.0)
BASOS ABS: 0.1 10*3/uL (ref 0.0–0.1)
EOS ABS: 0.5 10*3/uL (ref 0.0–0.5)
EOS%: 7.6 % — AB (ref 0.0–7.0)
HCT: 40 % (ref 38.4–49.9)
HEMOGLOBIN: 13 g/dL (ref 13.0–17.1)
LYMPH%: 31 % (ref 14.0–49.0)
MCH: 26.4 pg — AB (ref 27.2–33.4)
MCHC: 32.4 g/dL (ref 32.0–36.0)
MCV: 81.6 fL (ref 79.3–98.0)
MONO#: 0.5 10*3/uL (ref 0.1–0.9)
MONO%: 7.6 % (ref 0.0–14.0)
NEUT#: 3.4 10*3/uL (ref 1.5–6.5)
NEUT%: 52.6 % (ref 39.0–75.0)
Platelets: 161 10*3/uL (ref 140–400)
RBC: 4.9 10*6/uL (ref 4.20–5.82)
RDW: 16.7 % — AB (ref 11.0–14.6)
WBC: 6.4 10*3/uL (ref 4.0–10.3)
lymph#: 2 10*3/uL (ref 0.9–3.3)

## 2015-10-08 NOTE — Progress Notes (Signed)
[name] returns as scheduled. He continues Gleevec 200 mg daily. He is having 2 or fewer loose stools a day. He takes Imodium and/or Lomotil as needed. He is taking potassium once a day. He tends to forget to take the evening dose. He has nausea intermittently early in the mornings. No mouth sores. No rash. He continues smoking 1 pack per day.  Objective:  Vital signs in last 24 hours:  Blood pressure 145/84, pulse 78, temperature 98.3 F (36.8 C), temperature source Oral, resp. rate 20, height 5\' 9"  (1.753 m), weight 158 lb 4.8 oz (71.804 kg), SpO2 99 %.    HEENT: No thrush or ulcers. Resp: Lungs clear bilaterally. Cardio: Regular rate and rhythm. GI: Abdomen soft and nontender. No organomegaly. Vascular: No left leg edema. Skin: Faint fine rash upper outer arms.    Lab Results:  Lab Results  Component Value Date   WBC 6.4 10/08/2015   HGB 13.0 10/08/2015   HCT 40.0 10/08/2015   MCV 81.6 10/08/2015   PLT 161 10/08/2015   NEUTROABS 3.4 10/08/2015    Imaging:  No results found.  Medications: I have reviewed the patient's current medications.  Assessment/Plan: 1. Chronic myelogenous leukemia, diagnosed in January of 2009. He remains in hematologic remission. He is taking Gleevec at a dose of 200 mg daily. The peripheral blood PCR was markedly increased in May 2013, likely reflecting medical noncompliance. The peripheral blood PCR was significantly improved on 11/07/2011. The peripheral blood PCR was further improved on 01/31/2012. The peripheral blood PCR was increased on 04/27/2012; stable on 11/12/2012, improved on 02/12/2013; increased on 04/29/2013; increased on 07/10/2013; improved on 09/16/2013; improved on 10/28/2013; increased 02/17/2014; slightly improved 03/28/2014. Stable 04/29/2014. Increased 05/30/2014. Improved 07/26/2014. Increased 07/14/2015. 2. Status post left knee  replacement 05/14/2010. 3. Status post C3-C4, C4-C5, anterior cervical diskectomy and fusion with allograft and plating 09/30/2010. 4. 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 5. Depression 6. Diarrhea, ? related to chronic pancreatitis versus Gleevec. He takes Lomotil as needed.  7. Indurated facial skin lesion 11/20/2007 with a history of MRSA skin infection of the submental area in May 2008. The induration resolved with doxycycline. 8. Left knee arthroscopy 05/25/2007. 9. Postoperative left knee effusion/pain, likely related to gout. 10. History of gout.  11. Chronic pancreatitis. 12. Status post right above-the-knee amputation. 13. MRSA infection of the submental area May 2008. 14. History of tobacco, alcohol, and cocaine use. 15. History of coronary artery disease. 16. "Shotty" lymphadenopathy of the neck, axilla, and left groin in 2009.  17. History of a microcytic anemia.  18. History of a right olecranon bursa lesion, ? gouty tophus. 19. 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 He previously took AndroGel. 20. History of anemia secondary to chronic disease and Gleevec therapy.  21. history ofAnorexia-potentially related to [place]. He reports Medicaid would not pay for Megace.  22. Chronic left knee and left foot pain. d 5. Status post removal of a left knee screw 07/03/2012. 24. History of Hypokalemia. Likely related to diarrhea. 25. Status post left foot surgery. 26. Pulsatile fullness right neck. Evaluated by vascular surgery status post CT angiogram 03/07/2014 with no carotid artery aneurysm identified. 27. Cough-abnormal lung exam 05/12/2015. He completed a course of Levaquin. Chest xray negative.     Disposition: [name] appears stable. The peripheral blood PCR was improved last  month. He will continue Gleevec 200 mg daily. He will return for a follow-up visit in 4-6 weeks. He will contact the office  the interim with any problems.  We discussed smoking cessation.    [name] ANP/GNP-BC   10/08/2015  2:50 PM

## 2015-10-08 NOTE — Telephone Encounter (Signed)
per pof to sch pt appt-cld & spoke to pt and gave pt timwe & date of appt 7/11@11 

## 2015-10-09 ENCOUNTER — Telehealth: Payer: Self-pay | Admitting: Oncology

## 2015-10-09 ENCOUNTER — Telehealth: Payer: Self-pay | Admitting: *Deleted

## 2015-10-09 DIAGNOSIS — C9210 Chronic myeloid leukemia, BCR/ABL-positive, not having achieved remission: Secondary | ICD-10-CM

## 2015-10-09 NOTE — Telephone Encounter (Signed)
Informed pt of elevated creatinine level- recheck lab in 2 weeks, per Dr. [name]. Order to schedulers.

## 2015-10-09 NOTE — Telephone Encounter (Signed)
-----   Message from Ladell Pier, MD sent at 10/08/2015  9:17 PM EDT ----- Please call patient, check bmet in 2 weeks to f/u on elevated creatinine

## 2015-10-09 NOTE — Telephone Encounter (Signed)
lvm for pt for June appt.Marland KitchenMarland Kitchen

## 2015-10-23 ENCOUNTER — Other Ambulatory Visit (HOSPITAL_BASED_OUTPATIENT_CLINIC_OR_DEPARTMENT_OTHER): Payer: Medicare Other

## 2015-10-23 DIAGNOSIS — C9210 Chronic myeloid leukemia, BCR/ABL-positive, not having achieved remission: Secondary | ICD-10-CM

## 2015-10-23 LAB — BASIC METABOLIC PANEL
Anion Gap: 12 mEq/L — ABNORMAL HIGH (ref 3–11)
BUN: 14.4 mg/dL (ref 7.0–26.0)
CO2: 23 mEq/L (ref 22–29)
CREATININE: 1.4 mg/dL — AB (ref 0.7–1.3)
Calcium: 9.3 mg/dL (ref 8.4–10.4)
Chloride: 106 mEq/L (ref 98–109)
EGFR: 61 mL/min/{1.73_m2} — AB (ref >90)
GLUCOSE: 105 mg/dL (ref 70–140)
Potassium: 3.4 mEq/L — ABNORMAL LOW (ref 3.5–5.1)
Sodium: 142 mEq/L (ref 136–145)

## 2015-11-01 ENCOUNTER — Other Ambulatory Visit: Payer: Self-pay | Admitting: Oncology

## 2015-11-02 ENCOUNTER — Other Ambulatory Visit: Payer: Self-pay | Admitting: *Deleted

## 2015-11-04 ENCOUNTER — Telehealth: Payer: Self-pay | Admitting: Oncology

## 2015-11-04 NOTE — Telephone Encounter (Signed)
cld pt and left message of r/s appt from 7/11 to 7/18@11 :15 PER DR [facility]

## 2015-11-17 ENCOUNTER — Other Ambulatory Visit: Payer: Self-pay

## 2015-11-17 ENCOUNTER — Ambulatory Visit: Payer: Self-pay | Admitting: Oncology

## 2015-11-24 ENCOUNTER — Other Ambulatory Visit (HOSPITAL_BASED_OUTPATIENT_CLINIC_OR_DEPARTMENT_OTHER): Payer: Medicare Other

## 2015-11-24 ENCOUNTER — Ambulatory Visit (HOSPITAL_BASED_OUTPATIENT_CLINIC_OR_DEPARTMENT_OTHER): Payer: Medicare Other | Admitting: Nurse Practitioner

## 2015-11-24 VITALS — BP 182/98 | HR 67 | Temp 98.0°F | Resp 18 | Ht 69.0 in | Wt 163.0 lb

## 2015-11-24 DIAGNOSIS — C9210 Chronic myeloid leukemia, BCR/ABL-positive, not having achieved remission: Secondary | ICD-10-CM

## 2015-11-24 DIAGNOSIS — C9211 Chronic myeloid leukemia, BCR/ABL-positive, in remission: Secondary | ICD-10-CM | POA: Diagnosis present

## 2015-11-24 LAB — CBC WITH DIFFERENTIAL/PLATELET
BASO%: 0.4 % (ref 0.0–2.0)
BASOS ABS: 0 10*3/uL (ref 0.0–0.1)
EOS ABS: 0.4 10*3/uL (ref 0.0–0.5)
EOS%: 7.5 % — ABNORMAL HIGH (ref 0.0–7.0)
HEMATOCRIT: 38.4 % (ref 38.4–49.9)
HGB: 12.8 g/dL — ABNORMAL LOW (ref 13.0–17.1)
LYMPH#: 1.5 10*3/uL (ref 0.9–3.3)
LYMPH%: 28.6 % (ref 14.0–49.0)
MCH: 26.9 pg — AB (ref 27.2–33.4)
MCHC: 33.3 g/dL (ref 32.0–36.0)
MCV: 80.7 fL (ref 79.3–98.0)
MONO#: 0.3 10*3/uL (ref 0.1–0.9)
MONO%: 6.7 % (ref 0.0–14.0)
NEUT#: 2.9 10*3/uL (ref 1.5–6.5)
NEUT%: 56.8 % (ref 39.0–75.0)
PLATELETS: 145 10*3/uL (ref 140–400)
RBC: 4.76 10*6/uL (ref 4.20–5.82)
RDW: 16.3 % — ABNORMAL HIGH (ref 11.0–14.6)
WBC: 5.1 10*3/uL (ref 4.0–10.3)

## 2015-11-24 LAB — COMPREHENSIVE METABOLIC PANEL
ALBUMIN: 3.6 g/dL (ref 3.5–5.0)
ALK PHOS: 75 U/L (ref 40–150)
ALT: 27 U/L (ref 0–55)
ANION GAP: 11 meq/L (ref 3–11)
AST: 26 U/L (ref 5–34)
BILIRUBIN TOTAL: 0.32 mg/dL (ref 0.20–1.20)
BUN: 15.8 mg/dL (ref 7.0–26.0)
CALCIUM: 9.1 mg/dL (ref 8.4–10.4)
CHLORIDE: 107 meq/L (ref 98–109)
CO2: 28 mEq/L (ref 22–29)
CREATININE: 1.5 mg/dL — AB (ref 0.7–1.3)
EGFR: 53 mL/min/{1.73_m2} — ABNORMAL LOW (ref >90)
Glucose: 104 mg/dl (ref 70–140)
Potassium: 3.4 mEq/L — ABNORMAL LOW (ref 3.5–5.1)
Sodium: 146 mEq/L — ABNORMAL HIGH (ref 136–145)
Total Protein: 6.1 g/dL — ABNORMAL LOW (ref 6.4–8.3)
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
## 2015-11-26 ENCOUNTER — Telehealth: Payer: Self-pay | Admitting: Oncology

## 2015-11-26 NOTE — Telephone Encounter (Signed)
spoke w/ pt confirmed 8/15 apt times

## 2015-12-01 ENCOUNTER — Emergency Department (HOSPITAL_COMMUNITY): Payer: Medicare Other

## 2015-12-01 ENCOUNTER — Encounter (HOSPITAL_COMMUNITY): Payer: Self-pay

## 2015-12-01 ENCOUNTER — Emergency Department (HOSPITAL_COMMUNITY)
Admission: EM | Admit: 2015-12-01 | Discharge: 2015-12-01 | Disposition: A | Discharge status: Home / Self Care | Payer: Medicare Other | Attending: Emergency Medicine | Admitting: Emergency Medicine

## 2015-12-01 DIAGNOSIS — I10 Essential (primary) hypertension: Secondary | ICD-10-CM | POA: Diagnosis not present

## 2015-12-01 DIAGNOSIS — R7989 Other specified abnormal findings of blood chemistry: Secondary | ICD-10-CM

## 2015-12-01 DIAGNOSIS — N289 Disorder of kidney and ureter, unspecified: Secondary | ICD-10-CM | POA: Diagnosis not present

## 2015-12-01 DIAGNOSIS — M1000 Idiopathic gout, unspecified site: Secondary | ICD-10-CM | POA: Insufficient documentation

## 2015-12-01 DIAGNOSIS — R197 Diarrhea, unspecified: Secondary | ICD-10-CM | POA: Insufficient documentation

## 2015-12-01 DIAGNOSIS — F17210 Nicotine dependence, cigarettes, uncomplicated: Secondary | ICD-10-CM | POA: Insufficient documentation

## 2015-12-01 DIAGNOSIS — E876 Hypokalemia: Secondary | ICD-10-CM | POA: Diagnosis not present

## 2015-12-01 DIAGNOSIS — R112 Nausea with vomiting, unspecified: Secondary | ICD-10-CM

## 2015-12-01 DIAGNOSIS — I252 Old myocardial infarction: Secondary | ICD-10-CM | POA: Diagnosis not present

## 2015-12-01 DIAGNOSIS — R945 Abnormal results of liver function studies: Secondary | ICD-10-CM | POA: Diagnosis not present

## 2015-12-01 DIAGNOSIS — Z8679 Personal history of other diseases of the circulatory system: Secondary | ICD-10-CM | POA: Diagnosis not present

## 2015-12-01 DIAGNOSIS — M109 Gout, unspecified: Secondary | ICD-10-CM

## 2015-12-01 DIAGNOSIS — Z79899 Other long term (current) drug therapy: Secondary | ICD-10-CM | POA: Insufficient documentation

## 2015-12-01 DIAGNOSIS — Z856 Personal history of leukemia: Secondary | ICD-10-CM | POA: Insufficient documentation

## 2015-12-01 LAB — URINALYSIS, ROUTINE W REFLEX MICROSCOPIC
Bilirubin Urine: NEGATIVE
GLUCOSE, UA: NEGATIVE mg/dL
Hgb urine dipstick: NEGATIVE
Ketones, ur: NEGATIVE mg/dL
LEUKOCYTES UA: NEGATIVE
Nitrite: NEGATIVE
SPECIFIC GRAVITY, URINE: 1.019 (ref 1.005–1.030)
pH: 6 (ref 5.0–8.0)

## 2015-12-01 LAB — CBC WITH DIFFERENTIAL/PLATELET
BASOS ABS: 0 10*3/uL (ref 0.0–0.1)
Basophils Relative: 0 %
EOS PCT: 3 %
Eosinophils Absolute: 0.2 10*3/uL (ref 0.0–0.7)
HCT: 40.2 % (ref 39.0–52.0)
HEMOGLOBIN: 13 g/dL (ref 13.0–17.0)
LYMPHS PCT: 15 %
Lymphs Abs: 1 10*3/uL (ref 0.7–4.0)
MCH: 26.3 pg (ref 26.0–34.0)
MCHC: 32.3 g/dL (ref 30.0–36.0)
MCV: 81.4 fL (ref 78.0–100.0)
Monocytes Absolute: 0.7 10*3/uL (ref 0.1–1.0)
Monocytes Relative: 11 %
NEUTROS ABS: 4.7 10*3/uL (ref 1.7–7.7)
NEUTROS PCT: 71 %
PLATELETS: 181 10*3/uL (ref 150–400)
RBC: 4.94 MIL/uL (ref 4.22–5.81)
RDW: 16.6 % — ABNORMAL HIGH (ref 11.5–15.5)
WBC: 6.7 10*3/uL (ref 4.0–10.5)

## 2015-12-01 LAB — I-STAT TROPONIN, ED: TROPONIN I, POC: 0.01 ng/mL (ref 0.00–0.08)

## 2015-12-01 LAB — COMPREHENSIVE METABOLIC PANEL
ALT: 74 U/L — ABNORMAL HIGH (ref 17–63)
ANION GAP: 10 (ref 5–15)
AST: 83 U/L — ABNORMAL HIGH (ref 15–41)
Albumin: 3.8 g/dL (ref 3.5–5.0)
Alkaline Phosphatase: 71 U/L (ref 38–126)
BUN: 15 mg/dL (ref 6–20)
CHLORIDE: 105 mmol/L (ref 101–111)
CO2: 26 mmol/L (ref 22–32)
CREATININE: 1.53 mg/dL — AB (ref 0.61–1.24)
Calcium: 8.6 mg/dL — ABNORMAL LOW (ref 8.9–10.3)
GFR, EST AFRICAN AMERICAN: 51 mL/min — AB (ref >60)
GFR, EST NON AFRICAN AMERICAN: 44 mL/min — AB (ref >60)
Glucose, Bld: 105 mg/dL — ABNORMAL HIGH (ref 65–99)
POTASSIUM: 2.7 mmol/L — AB (ref 3.5–5.1)
SODIUM: 141 mmol/L (ref 135–145)
Total Bilirubin: 1.2 mg/dL (ref 0.3–1.2)
Total Protein: 6.2 g/dL — ABNORMAL LOW (ref 6.5–8.1)

## 2015-12-01 LAB — URINE MICROSCOPIC-ADD ON

## 2015-12-01 LAB — LIPASE, BLOOD: LIPASE: 15 U/L (ref 11–51)

## 2015-12-01 MED ORDER — FENTANYL CITRATE (PF) 100 MCG/2ML IJ SOLN
50.0000 ug | Freq: Once | INTRAMUSCULAR | Status: AC
  Administered 2015-12-01: 50 ug via INTRAVENOUS
  Filled 2015-12-01: qty 2

## 2015-12-01 MED ORDER — PROMETHAZINE HCL 25 MG PO TABS: 25.0000 mg | ORAL_TABLET | Freq: Four times a day (QID) | ORAL | 0 refills | Status: DC | PRN

## 2015-12-01 MED ORDER — SODIUM CHLORIDE 0.9 % IV BOLUS (SEPSIS)
1000.0000 mL | Freq: Once | INTRAVENOUS | Status: AC
  Administered 2015-12-01: 1000 mL via INTRAVENOUS

## 2015-12-01 MED ORDER — TRAMADOL HCL 50 MG PO TABS: 50.0000 mg | ORAL_TABLET | Freq: Four times a day (QID) | ORAL | 0 refills | Status: DC | PRN

## 2015-12-01 MED ORDER — ONDANSETRON HCL 4 MG/2ML IJ SOLN
4.0000 mg | Freq: Once | INTRAMUSCULAR | Status: AC
  Administered 2015-12-01: 4 mg via INTRAVENOUS
  Filled 2015-12-01: qty 2

## 2015-12-01 MED ORDER — POTASSIUM CHLORIDE CRYS ER 20 MEQ PO TBCR
60.0000 meq | EXTENDED_RELEASE_TABLET | Freq: Once | ORAL | Status: AC
  Administered 2015-12-01: 60 meq via ORAL
  Filled 2015-12-01: qty 3

## 2015-12-01 MED ORDER — POTASSIUM CHLORIDE 10 MEQ/100ML IV SOLN
10.0000 meq | INTRAVENOUS | Status: AC
  Administered 2015-12-01 (×2): 10 meq via INTRAVENOUS
  Filled 2015-12-01 (×2): qty 100

## 2015-12-01 NOTE — ED Notes (Signed)
Pt reminded again for need of a urine sample.  Pt expressed understanding and stated he will attempt.

## 2015-12-01 NOTE — ED Provider Notes (Signed)
Complains of pain at both elbows and both hands for several months, typical of gout but he's had in the past also complains of vomiting and diarrhea and cough productive of white sputum onset 3 or 4 days ago. He denies any fever.. No other associated symptoms. On exam appears in no distress lungs clear auscultation heart regular rate and rhythm abdomen nondistended nontender. All 4 extremities without redness. He is swollen bilateral elbows, without redness or warmth. Hands appear normal   Orlie Dakin, MD 12/01/15 1231

## 2015-12-01 NOTE — ED Notes (Signed)
Bed: [id number] Expected date:  Expected time:  Means of arrival:  Comments: EMS- 70yo M, n/v/d x 3 days

## 2015-12-01 NOTE — ED Notes (Signed)
MD at bedside. 
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
## 2015-12-01 NOTE — ED Notes (Signed)
Pt cannot use restroom at this time, aware urine specimen is needed.  

## 2015-12-01 NOTE — Discharge Instructions (Signed)
Please follow up with your primary care provider as soon as possible. I will give you a couple prescriptions to help with your nausea as well as your pain. Please have your primary care provider re-check your metabolic panel. Take all of your home medications as prescribed.

## 2015-12-01 NOTE — ED Triage Notes (Signed)
Pt from home.  Per reports n/v/d x 3 days.  Had slight orthostatic changes by EMS from lying to sitting.  Didn't report fever.  In remission for leukemia.  C/O chronic pain to hands.  124/86, 70SR lying, 112/p, 88 sitting.  EMS reports resps unlabored w/CBG 118.

## 2015-12-02 ENCOUNTER — Other Ambulatory Visit: Payer: Self-pay | Admitting: Oncology

## 2015-12-22 ENCOUNTER — Other Ambulatory Visit (HOSPITAL_BASED_OUTPATIENT_CLINIC_OR_DEPARTMENT_OTHER): Payer: Medicare Other

## 2015-12-22 ENCOUNTER — Ambulatory Visit (HOSPITAL_BASED_OUTPATIENT_CLINIC_OR_DEPARTMENT_OTHER): Payer: Medicare Other | Admitting: Oncology

## 2015-12-22 VITALS — BP 148/97 | HR 89 | Temp 98.5°F | Resp 18 | Ht 69.0 in | Wt 158.4 lb

## 2015-12-22 DIAGNOSIS — Z720 Tobacco use: Secondary | ICD-10-CM

## 2015-12-22 DIAGNOSIS — C9211 Chronic myeloid leukemia, BCR/ABL-positive, in remission: Secondary | ICD-10-CM

## 2015-12-22 DIAGNOSIS — R197 Diarrhea, unspecified: Secondary | ICD-10-CM

## 2015-12-22 DIAGNOSIS — C9210 Chronic myeloid leukemia, BCR/ABL-positive, not having achieved remission: Secondary | ICD-10-CM

## 2015-12-22 LAB — COMPREHENSIVE METABOLIC PANEL
ALK PHOS: 86 U/L (ref 40–150)
ALT: 47 U/L (ref 0–55)
ANION GAP: 9 meq/L (ref 3–11)
AST: 41 U/L — ABNORMAL HIGH (ref 5–34)
Albumin: 3.4 g/dL — ABNORMAL LOW (ref 3.5–5.0)
BILIRUBIN TOTAL: 0.38 mg/dL (ref 0.20–1.20)
BUN: 14.3 mg/dL (ref 7.0–26.0)
CALCIUM: 9.2 mg/dL (ref 8.4–10.4)
CHLORIDE: 109 meq/L (ref 98–109)
CO2: 26 mEq/L (ref 22–29)
CREATININE: 1.5 mg/dL — AB (ref 0.7–1.3)
EGFR: 55 mL/min/{1.73_m2} — ABNORMAL LOW (ref >90)
Glucose: 90 mg/dl (ref 70–140)
Potassium: 3.6 mEq/L (ref 3.5–5.1)
Sodium: 143 mEq/L (ref 136–145)
Total Protein: 6.1 g/dL — ABNORMAL LOW (ref 6.4–8.3)

## 2015-12-22 LAB — CBC WITH DIFFERENTIAL/PLATELET
BASO%: 0.6 % (ref 0.0–2.0)
BASOS ABS: 0 10*3/uL (ref 0.0–0.1)
EOS%: 9.4 % — ABNORMAL HIGH (ref 0.0–7.0)
Eosinophils Absolute: 0.5 10*3/uL (ref 0.0–0.5)
HEMATOCRIT: 40.3 % (ref 38.4–49.9)
HGB: 12.9 g/dL — ABNORMAL LOW (ref 13.0–17.1)
LYMPH%: 22.6 % (ref 14.0–49.0)
MCH: 26.1 pg — ABNORMAL LOW (ref 27.2–33.4)
MCHC: 32 g/dL (ref 32.0–36.0)
MCV: 81.7 fL (ref 79.3–98.0)
MONO#: 0.5 10*3/uL (ref 0.1–0.9)
MONO%: 8.5 % (ref 0.0–14.0)
NEUT#: 3.3 10*3/uL (ref 1.5–6.5)
NEUT%: 58.9 % (ref 39.0–75.0)
PLATELETS: 177 10*3/uL (ref 140–400)
RBC: 4.94 10*6/uL (ref 4.20–5.82)
RDW: 16.4 % — AB (ref 11.0–14.6)
WBC: 5.6 10*3/uL (ref 4.0–10.3)
lymph#: 1.3 10*3/uL (ref 0.9–3.3)

## 2015-12-22 NOTE — Progress Notes (Signed)
[facility] OFFICE PROGRESS NOTE   Diagnosis: CML  INTERVAL HISTORY:   [name] returns as scheduled. He continues [place]. He has intermittent diarrhea, 2-3 times per day, relieved with Imodium. He is taking potassium. He was seen in the emergency room with diarrhea and hypokalemia on 12/01/2015. He continues smoking.  Objective:  Vital signs in last 24 hours:  Blood pressure (!) 148/97, pulse 89, temperature 98.5 F (36.9 C), temperature source Oral, resp. rate 18, height 5\' 9"  (1.753 m), weight 158 lb 6.4 oz (71.8 kg), SpO2 98 %.    HEENT: No thrush or ulcers Resp: Lungs with inspiratory rhonchi at the posterior base bilaterally, no respiratory distress Cardio: Regular rate and rhythm GI: No hepatosplenomegaly, nontender Vascular: No left leg edema   Lab Results:  Lab Results  Component Value Date   WBC 5.6 12/22/2015   HGB 12.9 (L) 12/22/2015   HCT 40.3 12/22/2015   MCV 81.7 12/22/2015   PLT 177 12/22/2015   NEUTROABS 3.3 12/22/2015  Potassium 3.6, BUN 14.3, crit 21.5 BCR/ABL on 11/24/2015-1.21%  Medications: I have reviewed the patient's current medications.  Assessment/Plan: 1. Chronic myelogenous leukemia, diagnosed in January of 2009. He remains in hematologic remission. He is taking Gleevec at a dose of 200 mg daily. The peripheral blood PCR was markedly increased in May 2013, likely reflecting medical noncompliance. The peripheral blood PCR was significantly improved on 11/07/2011. The peripheral blood PCR was further improved on 01/31/2012. The peripheral blood PCR was increased on 04/27/2012; stable on 11/12/2012, improved on 02/12/2013; increased on 04/29/2013; increased on 07/10/2013; improved on 09/16/2013; improved on 10/28/2013; increased 02/17/2014; slightly improved 03/28/2014. Stable 04/29/2014. Increased 05/30/2014. Improved 07/26/2014. Increased 07/14/2015. 2. 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 3. Status post C3-C4, C4-C5,  anterior cervical diskectomy and fusion with allograft and plating 09/30/2010. 4. 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 5. Depression 6. Diarrhea, ? related to chronic pancreatitis versus Gleevec. He takes Lomotil as needed.  7. Indurated facial skin lesion 11/20/2007 with a history of MRSA skin infection of the submental area in May 2008. The induration resolved with doxycycline. 8. Left knee arthroscopy 05/25/2007. 9. Postoperative left knee effusion/pain, likely related to gout. 10. History of gout.  11. Chronic pancreatitis. 12. Status post right above-the-knee amputation. 13. MRSA infection of the submental area May 2008. 14. History of tobacco, alcohol, and cocaine use. 15. History of coronary artery disease. 16. "Shotty" lymphadenopathy of the neck, axilla, and left groin in 2009.  17. History of a microcytic anemia.  18. History of a right olecranon bursa lesion, ? gouty tophus. 19. 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 He previously took AndroGel. 20. History of anemia secondary to chronic disease and Gleevec therapy.  21. history ofAnorexia-potentially related to [place]. He reports Medicaid would not pay for Megace.  22. Chronic left knee and left foot pain. d 23. Status post removal of a left knee screw 07/03/2012. 24. History of Hypokalemia. Likely related to diarrhea. 25. Status post left foot surgery. 26. Pulsatile fullness right neck. Evaluated by vascular surgery status post CT angiogram 03/07/2014 with no carotid artery aneurysm identified. 27. Cough-abnormal lung exam 05/12/2015. He completed a course of Levaquin. Chest xray negative.   Disposition:  [name] appears stable. He remains in hematologic remission from CML, but the peripheral blood PCR remains detectable and stable. He will continue Gleevec at current dose. He will return for an office and lab visit  in 6 weeks.  Bruce Coder,  MD  12/22/2015  12:10 PM

## 2015-12-25 ENCOUNTER — Telehealth: Payer: Self-pay | Admitting: Oncology

## 2015-12-25 NOTE — Telephone Encounter (Signed)
Appointment confirmed with patient.

## 2015-12-28 ENCOUNTER — Other Ambulatory Visit: Payer: Self-pay | Admitting: Oncology

## 2016-01-22 ENCOUNTER — Observation Stay (HOSPITAL_COMMUNITY)
Admission: EM | Admit: 2016-01-22 | Discharge: 2016-01-23 | Disposition: A | Discharge status: Home / Self Care | Payer: Medicare Other | Attending: Internal Medicine | Admitting: Internal Medicine

## 2016-01-22 ENCOUNTER — Encounter (HOSPITAL_COMMUNITY): Payer: Self-pay

## 2016-01-22 ENCOUNTER — Emergency Department (HOSPITAL_COMMUNITY): Payer: Medicare Other

## 2016-01-22 ENCOUNTER — Observation Stay (HOSPITAL_COMMUNITY): Payer: Medicare Other

## 2016-01-22 DIAGNOSIS — E860 Dehydration: Secondary | ICD-10-CM | POA: Diagnosis present

## 2016-01-22 DIAGNOSIS — I708 Atherosclerosis of other arteries: Secondary | ICD-10-CM | POA: Diagnosis not present

## 2016-01-22 DIAGNOSIS — Z96652 Presence of left artificial knee joint: Secondary | ICD-10-CM | POA: Insufficient documentation

## 2016-01-22 DIAGNOSIS — C9210 Chronic myeloid leukemia, BCR/ABL-positive, not having achieved remission: Secondary | ICD-10-CM | POA: Diagnosis not present

## 2016-01-22 DIAGNOSIS — M24662 Ankylosis, left knee: Secondary | ICD-10-CM | POA: Diagnosis not present

## 2016-01-22 DIAGNOSIS — E876 Hypokalemia: Secondary | ICD-10-CM | POA: Insufficient documentation

## 2016-01-22 DIAGNOSIS — F17210 Nicotine dependence, cigarettes, uncomplicated: Secondary | ICD-10-CM | POA: Diagnosis not present

## 2016-01-22 DIAGNOSIS — Z89611 Acquired absence of right leg above knee: Secondary | ICD-10-CM | POA: Diagnosis not present

## 2016-01-22 DIAGNOSIS — R112 Nausea with vomiting, unspecified: Secondary | ICD-10-CM | POA: Diagnosis not present

## 2016-01-22 DIAGNOSIS — I720 Aneurysm of carotid artery: Secondary | ICD-10-CM | POA: Diagnosis not present

## 2016-01-22 DIAGNOSIS — M1990 Unspecified osteoarthritis, unspecified site: Secondary | ICD-10-CM | POA: Diagnosis not present

## 2016-01-22 DIAGNOSIS — Z720 Tobacco use: Secondary | ICD-10-CM | POA: Diagnosis present

## 2016-01-22 DIAGNOSIS — Z9221 Personal history of antineoplastic chemotherapy: Secondary | ICD-10-CM | POA: Insufficient documentation

## 2016-01-22 DIAGNOSIS — I10 Essential (primary) hypertension: Secondary | ICD-10-CM | POA: Insufficient documentation

## 2016-01-22 DIAGNOSIS — R0602 Shortness of breath: Secondary | ICD-10-CM | POA: Diagnosis not present

## 2016-01-22 DIAGNOSIS — F1014 Alcohol abuse with alcohol-induced mood disorder: Secondary | ICD-10-CM | POA: Insufficient documentation

## 2016-01-22 DIAGNOSIS — I77810 Thoracic aortic ectasia: Secondary | ICD-10-CM | POA: Diagnosis not present

## 2016-01-22 DIAGNOSIS — F1094 Alcohol use, unspecified with alcohol-induced mood disorder: Secondary | ICD-10-CM | POA: Diagnosis not present

## 2016-01-22 DIAGNOSIS — F419 Anxiety disorder, unspecified: Secondary | ICD-10-CM | POA: Diagnosis not present

## 2016-01-22 DIAGNOSIS — M109 Gout, unspecified: Secondary | ICD-10-CM | POA: Insufficient documentation

## 2016-01-22 DIAGNOSIS — Z888 Allergy status to other drugs, medicaments and biological substances status: Secondary | ICD-10-CM | POA: Diagnosis not present

## 2016-01-22 DIAGNOSIS — R197 Diarrhea, unspecified: Secondary | ICD-10-CM | POA: Diagnosis not present

## 2016-01-22 DIAGNOSIS — K8689 Other specified diseases of pancreas: Secondary | ICD-10-CM | POA: Diagnosis not present

## 2016-01-22 DIAGNOSIS — I252 Old myocardial infarction: Secondary | ICD-10-CM | POA: Insufficient documentation

## 2016-01-22 LAB — CBC WITH DIFFERENTIAL/PLATELET
BASOS ABS: 0 10*3/uL (ref 0.0–0.1)
BASOS PCT: 0 %
EOS ABS: 0.4 10*3/uL (ref 0.0–0.7)
Eosinophils Relative: 11 %
HEMATOCRIT: 39.8 % (ref 39.0–52.0)
HEMOGLOBIN: 12.8 g/dL — AB (ref 13.0–17.0)
Lymphocytes Relative: 30 %
Lymphs Abs: 1.2 10*3/uL (ref 0.7–4.0)
MCH: 26.4 pg (ref 26.0–34.0)
MCHC: 32.2 g/dL (ref 30.0–36.0)
MCV: 82.2 fL (ref 78.0–100.0)
Monocytes Absolute: 0.4 10*3/uL (ref 0.1–1.0)
Monocytes Relative: 9 %
NEUTROS ABS: 2 10*3/uL (ref 1.7–7.7)
NEUTROS PCT: 50 %
Platelets: 148 10*3/uL — ABNORMAL LOW (ref 150–400)
RBC: 4.84 MIL/uL (ref 4.22–5.81)
RDW: 16.1 % — AB (ref 11.5–15.5)
WBC: 4 10*3/uL (ref 4.0–10.5)

## 2016-01-22 LAB — URINE MICROSCOPIC-ADD ON
BACTERIA UA: NONE SEEN
RBC / HPF: NONE SEEN RBC/hpf (ref 0–5)
SQUAMOUS EPITHELIAL / LPF: NONE SEEN

## 2016-01-22 LAB — COMPREHENSIVE METABOLIC PANEL
ALT: 86 U/L — AB (ref 17–63)
AST: 60 U/L — AB (ref 15–41)
Albumin: 3.4 g/dL — ABNORMAL LOW (ref 3.5–5.0)
Alkaline Phosphatase: 80 U/L (ref 38–126)
Anion gap: 7 (ref 5–15)
BILIRUBIN TOTAL: 0.7 mg/dL (ref 0.3–1.2)
BUN: 11 mg/dL (ref 6–20)
CALCIUM: 8.9 mg/dL (ref 8.9–10.3)
CO2: 25 mmol/L (ref 22–32)
Chloride: 108 mmol/L (ref 101–111)
Creatinine, Ser: 1.39 mg/dL — ABNORMAL HIGH (ref 0.61–1.24)
GFR, EST AFRICAN AMERICAN: 58 mL/min — AB (ref >60)
GFR, EST NON AFRICAN AMERICAN: 50 mL/min — AB (ref >60)
Glucose, Bld: 97 mg/dL (ref 65–99)
Potassium: 2.7 mmol/L — CL (ref 3.5–5.1)
Sodium: 140 mmol/L (ref 135–145)
TOTAL PROTEIN: 5.5 g/dL — AB (ref 6.5–8.1)

## 2016-01-22 LAB — URINALYSIS, ROUTINE W REFLEX MICROSCOPIC
BILIRUBIN URINE: NEGATIVE
Bilirubin Urine: NEGATIVE
GLUCOSE, UA: NEGATIVE mg/dL
Glucose, UA: NEGATIVE mg/dL
Hgb urine dipstick: NEGATIVE
Hgb urine dipstick: NEGATIVE
Ketones, ur: NEGATIVE mg/dL
Ketones, ur: NEGATIVE mg/dL
Leukocytes, UA: NEGATIVE
Leukocytes, UA: NEGATIVE
NITRITE: NEGATIVE
Nitrite: NEGATIVE
PH: 5.5 (ref 5.0–8.0)
Protein, ur: 100 mg/dL — AB
Protein, ur: 100 mg/dL — AB
SPECIFIC GRAVITY, URINE: 1.013 (ref 1.005–1.030)
SPECIFIC GRAVITY, URINE: 1.011 (ref 1.005–1.030)
pH: 6 (ref 5.0–8.0)

## 2016-01-22 LAB — I-STAT TROPONIN, ED: TROPONIN I, POC: 0.01 ng/mL (ref 0.00–0.08)

## 2016-01-22 LAB — CBC
HCT: 38.2 % — ABNORMAL LOW (ref 39.0–52.0)
Hemoglobin: 12.1 g/dL — ABNORMAL LOW (ref 13.0–17.0)
MCH: 26 pg (ref 26.0–34.0)
MCHC: 31.7 g/dL (ref 30.0–36.0)
MCV: 82 fL (ref 78.0–100.0)
PLATELETS: 160 10*3/uL (ref 150–400)
RBC: 4.66 MIL/uL (ref 4.22–5.81)
RDW: 16.1 % — AB (ref 11.5–15.5)
WBC: 4.3 10*3/uL (ref 4.0–10.5)

## 2016-01-22 LAB — LIPASE, BLOOD: LIPASE: 86 U/L — AB (ref 11–51)

## 2016-01-22 LAB — CREATININE, SERUM
Creatinine, Ser: 1.35 mg/dL — ABNORMAL HIGH (ref 0.61–1.24)
GFR, EST AFRICAN AMERICAN: 60 mL/min — AB (ref >60)
GFR, EST NON AFRICAN AMERICAN: 52 mL/min — AB (ref >60)

## 2016-01-22 LAB — I-STAT CG4 LACTIC ACID, ED: Lactic Acid, Venous: 0.72 mmol/L (ref 0.5–1.9)

## 2016-01-22 LAB — MAGNESIUM: Magnesium: 1.6 mg/dL — ABNORMAL LOW (ref 1.7–2.4)

## 2016-01-22 LAB — TSH: TSH: 1.214 u[IU]/mL (ref 0.350–4.500)

## 2016-01-22 LAB — TROPONIN I: Troponin I: 0.03 ng/mL (ref <0.03)

## 2016-01-22 MED ORDER — HEPARIN SODIUM (PORCINE) 5000 UNIT/ML IJ SOLN
5000.0000 [IU] | Freq: Three times a day (TID) | INTRAMUSCULAR | Status: DC
  Filled 2016-01-22: qty 1

## 2016-01-22 MED ORDER — TRAMADOL HCL 50 MG PO TABS
50.0000 mg | ORAL_TABLET | Freq: Four times a day (QID) | ORAL | Status: DC | PRN
  Administered 2016-01-22: 50 mg via ORAL
  Filled 2016-01-22: qty 1

## 2016-01-22 MED ORDER — POTASSIUM CHLORIDE CRYS ER 20 MEQ PO TBCR
40.0000 meq | EXTENDED_RELEASE_TABLET | Freq: Every day | ORAL | Status: DC
  Administered 2016-01-22 – 2016-01-23 (×2): 40 meq via ORAL
  Filled 2016-01-22 (×2): qty 2

## 2016-01-22 MED ORDER — AMLODIPINE BESYLATE 10 MG PO TABS
10.0000 mg | ORAL_TABLET | Freq: Every day | ORAL | Status: DC
  Administered 2016-01-22 – 2016-01-23 (×2): 10 mg via ORAL
  Filled 2016-01-22 (×2): qty 1

## 2016-01-22 MED ORDER — PANCRELIPASE (LIP-PROT-AMYL) 12000-38000 UNITS PO CPEP
24000.0000 [IU] | ORAL_CAPSULE | Freq: Three times a day (TID) | ORAL | Status: DC
  Administered 2016-01-22 – 2016-01-23 (×4): 24000 [IU] via ORAL
  Filled 2016-01-22 (×5): qty 2

## 2016-01-22 MED ORDER — SIMVASTATIN 40 MG PO TABS
40.0000 mg | ORAL_TABLET | Freq: Every day | ORAL | Status: DC
  Administered 2016-01-22: 40 mg via ORAL
  Filled 2016-01-22: qty 1

## 2016-01-22 MED ORDER — PANTOPRAZOLE SODIUM 40 MG PO TBEC
40.0000 mg | DELAYED_RELEASE_TABLET | Freq: Every day | ORAL | Status: DC
  Administered 2016-01-22 – 2016-01-23 (×2): 40 mg via ORAL
  Filled 2016-01-22 (×2): qty 1

## 2016-01-22 MED ORDER — SODIUM CHLORIDE 0.9 % IV BOLUS (SEPSIS)
500.0000 mL | Freq: Once | INTRAVENOUS | Status: AC
  Administered 2016-01-22: 500 mL via INTRAVENOUS

## 2016-01-22 MED ORDER — LEVALBUTEROL HCL 0.63 MG/3ML IN NEBU: 0.6300 mg | INHALATION_SOLUTION | Freq: Four times a day (QID) | RESPIRATORY_TRACT | Status: DC | PRN

## 2016-01-22 MED ORDER — ONDANSETRON HCL 4 MG/2ML IJ SOLN: 4.0000 mg | Freq: Four times a day (QID) | INTRAMUSCULAR | Status: DC | PRN

## 2016-01-22 MED ORDER — SODIUM CHLORIDE 0.9% FLUSH: 3.0000 mL | Freq: Two times a day (BID) | INTRAVENOUS | Status: DC

## 2016-01-22 MED ORDER — ACETAMINOPHEN 650 MG RE SUPP: 650.0000 mg | Freq: Four times a day (QID) | RECTAL | Status: DC | PRN

## 2016-01-22 MED ORDER — METOPROLOL SUCCINATE ER 50 MG PO TB24
50.0000 mg | ORAL_TABLET | Freq: Every day | ORAL | Status: DC
  Administered 2016-01-22 – 2016-01-23 (×2): 50 mg via ORAL
  Filled 2016-01-22 (×2): qty 1

## 2016-01-22 MED ORDER — IMATINIB MESYLATE 100 MG PO TABS
200.0000 mg | ORAL_TABLET | Freq: Every day | ORAL | Status: DC
  Filled 2016-01-22: qty 2

## 2016-01-22 MED ORDER — ONDANSETRON HCL 4 MG PO TABS: 4.0000 mg | ORAL_TABLET | Freq: Four times a day (QID) | ORAL | Status: DC | PRN

## 2016-01-22 MED ORDER — ACETAMINOPHEN 325 MG PO TABS: 650.0000 mg | ORAL_TABLET | Freq: Four times a day (QID) | ORAL | Status: DC | PRN

## 2016-01-22 MED ORDER — ALBUTEROL SULFATE HFA 108 (90 BASE) MCG/ACT IN AERS: 2.0000 | INHALATION_SPRAY | Freq: Four times a day (QID) | RESPIRATORY_TRACT | Status: DC | PRN

## 2016-01-22 MED ORDER — PROCHLORPERAZINE MALEATE 10 MG PO TABS: 5.0000 mg | ORAL_TABLET | Freq: Four times a day (QID) | ORAL | Status: DC | PRN

## 2016-01-22 MED ORDER — SODIUM CHLORIDE 0.9 % IV SOLN
INTRAVENOUS | Status: AC
  Administered 2016-01-22: 11:00:00 via INTRAVENOUS

## 2016-01-22 MED ORDER — POTASSIUM CHLORIDE IN NACL 20-0.9 MEQ/L-% IV SOLN
INTRAVENOUS | Status: DC
  Administered 2016-01-22 – 2016-01-23 (×3): via INTRAVENOUS
  Filled 2016-01-22 (×3): qty 1000

## 2016-01-22 MED ORDER — ONDANSETRON 4 MG PO TBDP
4.0000 mg | ORAL_TABLET | Freq: Once | ORAL | Status: AC
  Administered 2016-01-22: 4 mg via ORAL
  Filled 2016-01-22: qty 1

## 2016-01-22 MED ORDER — POTASSIUM CHLORIDE CRYS ER 20 MEQ PO TBCR
40.0000 meq | EXTENDED_RELEASE_TABLET | Freq: Once | ORAL | Status: AC
  Administered 2016-01-22: 40 meq via ORAL
  Filled 2016-01-22: qty 2
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
## 2016-01-22 NOTE — ED Triage Notes (Signed)
Pt presents with onset of nausea and vomiting that began this morning, reports diarrhea x 2-3 days.  Pt denies any abdominal pain, reports h/o same, out of phenergan.

## 2016-01-22 NOTE — ED Notes (Signed)
Attempted report to 3E. 
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
## 2016-01-23 DIAGNOSIS — F1094 Alcohol use, unspecified with alcohol-induced mood disorder: Secondary | ICD-10-CM | POA: Diagnosis not present

## 2016-01-23 DIAGNOSIS — C9210 Chronic myeloid leukemia, BCR/ABL-positive, not having achieved remission: Secondary | ICD-10-CM | POA: Diagnosis not present

## 2016-01-23 DIAGNOSIS — E860 Dehydration: Secondary | ICD-10-CM | POA: Diagnosis not present

## 2016-01-23 DIAGNOSIS — I720 Aneurysm of carotid artery: Secondary | ICD-10-CM | POA: Diagnosis not present

## 2016-01-23 LAB — GASTROINTESTINAL PANEL BY PCR, STOOL (REPLACES STOOL CULTURE)
ASTROVIRUS: NOT DETECTED
Adenovirus F40/41: NOT DETECTED
CAMPYLOBACTER SPECIES: NOT DETECTED
CYCLOSPORA CAYETANENSIS: NOT DETECTED
Cryptosporidium: NOT DETECTED
ENTEROAGGREGATIVE E COLI (EAEC): NOT DETECTED
Entamoeba histolytica: NOT DETECTED
Enteropathogenic E coli (EPEC): NOT DETECTED
Enterotoxigenic E coli (ETEC): NOT DETECTED
Giardia lamblia: NOT DETECTED
Norovirus GI/GII: NOT DETECTED
Plesimonas shigelloides: NOT DETECTED
ROTAVIRUS A: NOT DETECTED
SALMONELLA SPECIES: NOT DETECTED
SHIGA LIKE TOXIN PRODUCING E COLI (STEC): NOT DETECTED
Sapovirus (I, II, IV, and V): NOT DETECTED
Shigella/Enteroinvasive E coli (EIEC): NOT DETECTED
VIBRIO CHOLERAE: NOT DETECTED
Vibrio species: NOT DETECTED
YERSINIA ENTEROCOLITICA: NOT DETECTED

## 2016-01-23 LAB — COMPREHENSIVE METABOLIC PANEL
ALK PHOS: 73 U/L (ref 38–126)
ALT: 68 U/L — ABNORMAL HIGH (ref 17–63)
ANION GAP: 5 (ref 5–15)
AST: 47 U/L — ABNORMAL HIGH (ref 15–41)
Albumin: 3.1 g/dL — ABNORMAL LOW (ref 3.5–5.0)
BILIRUBIN TOTAL: 0.4 mg/dL (ref 0.3–1.2)
BUN: 7 mg/dL (ref 6–20)
CALCIUM: 8.7 mg/dL — AB (ref 8.9–10.3)
CO2: 27 mmol/L (ref 22–32)
Chloride: 111 mmol/L (ref 101–111)
Creatinine, Ser: 1.23 mg/dL (ref 0.61–1.24)
GFR calc non Af Amer: 58 mL/min — ABNORMAL LOW (ref >60)
Glucose, Bld: 86 mg/dL (ref 65–99)
Potassium: 3.4 mmol/L — ABNORMAL LOW (ref 3.5–5.1)
Sodium: 143 mmol/L (ref 135–145)
TOTAL PROTEIN: 5.1 g/dL — AB (ref 6.5–8.1)

## 2016-01-23 LAB — CBC
HCT: 37.7 % — ABNORMAL LOW (ref 39.0–52.0)
HEMOGLOBIN: 11.8 g/dL — AB (ref 13.0–17.0)
MCH: 25.8 pg — ABNORMAL LOW (ref 26.0–34.0)
MCHC: 31.3 g/dL (ref 30.0–36.0)
MCV: 82.3 fL (ref 78.0–100.0)
Platelets: 147 10*3/uL — ABNORMAL LOW (ref 150–400)
RBC: 4.58 MIL/uL (ref 4.22–5.81)
RDW: 16.2 % — ABNORMAL HIGH (ref 11.5–15.5)
WBC: 5.2 10*3/uL (ref 4.0–10.5)

## 2016-01-23 LAB — GLUCOSE, CAPILLARY: Glucose-Capillary: 96 mg/dL (ref 65–99)

## 2016-01-23 MED ORDER — PANCRELIPASE (LIP-PROT-AMYL) 24000-76000 UNITS PO CPEP: 24000.0000 [IU] | ORAL_CAPSULE | Freq: Three times a day (TID) | ORAL | 2 refills | Status: DC

## 2016-01-23 MED ORDER — POTASSIUM CHLORIDE CRYS ER 20 MEQ PO TBCR
60.0000 meq | EXTENDED_RELEASE_TABLET | Freq: Once | ORAL | Status: AC
  Administered 2016-01-23: 60 meq via ORAL
  Filled 2016-01-23: qty 3

## 2016-01-23 MED ORDER — MAGNESIUM SULFATE 2 GM/50ML IV SOLN
2.0000 g | Freq: Once | INTRAVENOUS | Status: AC
  Administered 2016-01-23: 2 g via INTRAVENOUS
  Filled 2016-01-23: qty 50

## 2016-01-23 MED ORDER — POTASSIUM CHLORIDE 20 MEQ PO PACK: 20.0000 meq | PACK | Freq: Three times a day (TID) | ORAL | 1 refills | Status: DC

## 2016-01-23 MED ORDER — MAGNESIUM OXIDE 400 (241.3 MG) MG PO TABS: 400.0000 mg | ORAL_TABLET | Freq: Two times a day (BID) | ORAL | 1 refills | Status: DC

## 2016-01-23 NOTE — Progress Notes (Signed)
Patient discharged via wheelchair with belongings.  Discharge instructions given with no further questions.  IV removed. Telemetry removed; CCMD notified. A.Jazarah Capili, RN

## 2016-01-23 NOTE — Discharge Summary (Signed)
Physician Discharge Summary  [name] MRN: [id number] DOB/AGE: 12-Jun-1945 70 y.o.  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 Qty: 270 capsule, Refills: 2    magnesium oxide (MAG-OX) 400 (241.3 Mg) MG tablet Take 1 tablet (400 mg total) by mouth 2 (two) times daily. Qty: 60 tablet, Refills: 1    potassium chloride (KLOR-CON) 20 MEQ packet Take 20 mEq by mouth 3 (three) times daily. Qty: 90 tablet, Refills: 1      CONTINUE these medications which have NOT CHANGED   Details  albuterol (PROVENTIL HFA;VENTOLIN HFA) 108 (90 BASE) MCG/ACT inhaler Inhale 2 puffs into the lungs every 6 (six) hours as needed for wheezing or shortness of breath.    amLODipine (NORVASC) 10 MG tablet Take 1 tablet (10 mg total) by mouth daily. Qty: 30 tablet, Refills: 0    colchicine 0.6 MG tablet Take 0.6 mg by mouth daily. For gout   Associated Diagnoses: Gout; CML (chronic myelocytic leukemia) (HCC)    imatinib (GLEEVEC) 100 MG tablet Take 2 tablets (200 mg total) by mouth daily. Take with meals and large glass of water.Caution:Chemotherapy Qty: 180 tablet, Refills: 3   Associated Diagnoses: 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HCC)    metoprolol succinate (TOPROL-XL) 50 MG 24 hr tablet Take 50 mg by mouth  daily. Refills: 5    pantoprazole (PROTONIX) 40 MG tablet Take 40 mg by mouth daily.     prochlorperazine (COMPAZINE) 5 MG tablet Take 1-2 tablets (5-10 mg total) by mouth every 6 (six) hours as needed for nausea. Qty: 30 tablet, Refills: 2   Associated Diagnoses: 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HCC)    simvastatin (ZOCOR) 40 MG tablet Take 40 mg by mouth at bedtime.    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 Qty: 15 tablet, Refills: 0    VIIBRYD 40 MG TABS Take 40 mg by mouth daily.   Associated Diagnoses: 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HCC)    diphenoxylate-atropine (LOMOTIL) 2.5-0.025 MG tablet TAKE 1 TABLET 4 TIMES A DAY AS NEEDED FOR DIARRHEA Qty: 90 tablet, Refills: 0    promethazine (PHENERGAN) 25 MG tablet Take 1 tablet (25 mg total) by mouth every 6 (six) hours as needed for nausea or vomiting. Qty: 30 tablet, Refills: 0    testosterone cypionate (DEPOTESTOSTERONE CYPIONATE) 200 MG/ML injection Inject 200 mg into the muscle every Tuesday.      STOP taking these medications     losartan-hydrochlorothiazide (HYZAAR) 100-25 MG per tablet      potassium chloride SA (KLOR-CON M20) 20 MEQ tablet          Discharge Condition: Stable   Discharge Instructions Get Medicines reviewed and  adjusted: Please take all your medications with you for your next visit with your Primary MD  Please request your Primary MD to go over all hospital tests and procedure/radiological results at the follow up, please ask your Primary MD to get all Hospital records sent to his/her office.  If you experience worsening of your admission symptoms, develop shortness of breath, life threatening emergency, suicidal or homicidal thoughts you must seek medical attention immediately by calling 911 or calling your MD immediately if symptoms less severe.  You  must read complete instructions/literature along with all the possible adverse reactions/side effects for all the Medicines you take and that have been prescribed to you. Take any new Medicines after you have completely understood and accpet all the possible adverse reactions/side effects.   Do not drive when taking Pain medications.   Do not take more than prescribed Pain, Sleep and Anxiety Medications  Special Instructions: If you have smoked or chewed Tobacco in the last 2 yrs please stop smoking, stop any regular Alcohol and or any Recreational drug use.  Wear Seat belts while driving.  Please note  You were cared for by a hospitalist during your hospital stay. Once you are discharged, your primary care physician will handle any further medical issues. Please note that NO REFILLS for any discharge medications will be authorized once you are discharged, as it is imperative that you return to your primary care physician (or establish a relationship with a primary care physician if you do not have one) for your aftercare needs so that they can reassess your need for medications and monitor your lab values.  Discharge Instructions    Diet - low sodium heart healthy    Complete by:  As directed    Increase activity slowly    Complete by:  As directed        Allergies  Allergen Reactions  . Iohexol Hives and Itching    Patient has itching and hives, needs 13 hour prep      Disposition: 01-Home or Self Care   Consults: None     Significant Diagnostic Studies:  Dg Chest 2 View  Result Date: 01/22/2016 CLINICAL DATA:  70 year old presenting with acute onset of shortness of breath. Prior history of MI. Current history of hypertension. EXAM: CHEST  2 VIEW COMPARISON:  12/01/2015, 05/12/2015 and earlier. FINDINGS: Cardiac silhouette normal in size, unchanged. Thoracic aorta tortuous, ectatic and atherosclerotic, unchanged dating back to 2014. Hilar and mediastinal contours  otherwise unremarkable. Stable chronic elevation of the right hemidiaphragm and chronic scar/ atelectasis in the right middle lobe and right lower lobe. Lungs otherwise clear. Pulmonary vascularity normal. Bronchovascular markings normal. No pleural effusions. Visualized bony thorax intact. IMPRESSION: 1.  No acute cardiopulmonary disease. 2. Stable chronic elevation of the right hemidiaphragm and chronic scar/atelectasis in the right middle lobe and right lower lobe. 3. Stable thoracic aortic ectasia and atherosclerosis. Electronically Signed   By: Evangeline Dakin M.D.   On: 01/22/2016 08:20   Abd 1 View (kub)  Result Date: 01/22/2016 CLINICAL DATA:  Chronic nausea vomiting and diarrhea history of leukemia EXAM: ABDOMEN - 1 VIEW COMPARISON:  CT 07/05/2014, radiograph 07/06/2014 FINDINGS: Bowel-gas pattern is nonspecific nonobstructed. Vascular calcifications in the left upper quadrant. Faint calcifications over the central abdomen likely represent combination of vascular calcification and pancreatic calcification. A left iliac stent is again visualized. There are atherosclerotic vascular calcifications. Multiple metallic densities over the right groin. IMPRESSION: 1. Nonspecific nonobstructed gas pattern 2. Atherosclerotic vascular disease  3. Scattered pancreatic calcification compatible with chronic pancreatitis 4. Multiple metallic densities over the right groin unchanged Electronically Signed   By: Donavan Foil M.D.   On: 01/22/2016 10:47          Filed Weights   01/22/16 0711 01/22/16 1643 01/23/16 0714  Weight: 70.3 kg (155 lb) 67.7 kg (149 lb 3.2 oz) 66.6 kg (146 lb 14.4 oz)     Microbiology: Recent Results (from the past 240 hour(s))  Culture, blood (routine x 2)     Status: None (Preliminary result)   Collection Time: 01/22/16  7:50 AM  Result Value Ref Range Status   Specimen Description BLOOD LEFT ANTECUBITAL  Final   Special Requests BOTTLES DRAWN AEROBIC AND ANAEROBIC 10CC   Final   Culture NO GROWTH < 12 HOURS  Final   Report Status PENDING  Incomplete  Culture, blood (routine x 2)     Status: None (Preliminary result)   Collection Time: 01/22/16  7:56 AM  Result Value Ref Range Status   Specimen Description BLOOD RIGHT FOREARM  Final   Special Requests BOTTLES DRAWN AEROBIC AND ANAEROBIC 5CC  Final   Culture NO GROWTH < 12 HOURS  Final   Report Status PENDING  Incomplete       Blood Culture    Component Value Date/Time   SDES BLOOD RIGHT FOREARM 01/22/2016 0756   SPECREQUEST BOTTLES DRAWN AEROBIC AND ANAEROBIC 5CC 01/22/2016 0756   CULT NO GROWTH < 12 HOURS 01/22/2016 0756   REPTSTATUS PENDING 01/22/2016 0756      Labs: Results for orders placed or performed during the hospital encounter of 01/22/16 (from the past 48 hour(s))  Comprehensive metabolic panel     Status: Abnormal   Collection Time: 01/22/16  7:35 AM  Result Value Ref Range   Sodium 140 135 - 145 mmol/L   Potassium 2.7 (LL) 3.5 - 5.1 mmol/L    Comment: CRITICAL RESULT CALLED TO, READ BACK BY AND VERIFIED WITH: J.NARON,RN 0835 01/22/16 CLARK,S    Chloride 108 101 - 111 mmol/L   CO2 25 22 - 32 mmol/L   Glucose, Bld 97 65 - 99 mg/dL   BUN 11 6 - 20 mg/dL   Creatinine, Ser 1.39 (H) 0.61 - 1.24 mg/dL   Calcium 8.9 8.9 - 10.3 mg/dL   Total Protein 5.5 (L) 6.5 - 8.1 g/dL   Albumin 3.4 (L) 3.5 - 5.0 g/dL   AST 60 (H) 15 - 41 U/L   ALT 86 (H) 17 - 63 U/L   Alkaline Phosphatase 80 38 - 126 U/L   Total Bilirubin 0.7 0.3 - 1.2 mg/dL   GFR calc non Af Amer 50 (L) >60 mL/min   GFR calc Af Amer 58 (L) >60 mL/min    Comment: (NOTE) The eGFR has been calculated using the CKD EPI equation. This calculation has not been validated in all clinical situations. eGFR's persistently <60 mL/min signify possible Chronic Kidney Disease.    Anion gap 7 5 - 15  Lipase, blood     Status: Abnormal   Collection Time: 01/22/16  7:35 AM  Result Value Ref Range   Lipase 86 (H) 11 - 51 U/L  CBC with  Differential     Status: Abnormal   Collection Time: 01/22/16  7:35 AM  Result Value Ref Range   WBC 4.0 4.0 - 10.5 K/uL   RBC 4.84 4.22 - 5.81 MIL/uL   Hemoglobin 12.8 (L) 13.0 - 17.0 g/dL   HCT 39.8 39.0 -  52.0 %   MCV 82.2 78.0 - 100.0 fL   MCH 26.4 26.0 - 34.0 pg   MCHC 32.2 30.0 - 36.0 g/dL   RDW 16.1 (H) 11.5 - 15.5 %   Platelets 148 (L) 150 - 400 K/uL   Neutrophils Relative % 50 %   Neutro Abs 2.0 1.7 - 7.7 K/uL   Lymphocytes Relative 30 %   Lymphs Abs 1.2 0.7 - 4.0 K/uL   Monocytes Relative 9 %   Monocytes Absolute 0.4 0.1 - 1.0 K/uL   Eosinophils Relative 11 %   Eosinophils Absolute 0.4 0.0 - 0.7 K/uL   Basophils Relative 0 %   Basophils Absolute 0.0 0.0 - 0.1 K/uL  Culture, blood (routine x 2)     Status: None (Preliminary result)   Collection Time: 01/22/16  7:50 AM  Result Value Ref Range   Specimen Description BLOOD LEFT ANTECUBITAL    Special Requests BOTTLES DRAWN AEROBIC AND ANAEROBIC 10CC    Culture NO GROWTH < 12 HOURS    Report Status PENDING   Culture, blood (routine x 2)     Status: None (Preliminary result)   Collection Time: 01/22/16  7:56 AM  Result Value Ref Range   Specimen Description BLOOD RIGHT FOREARM    Special Requests BOTTLES DRAWN AEROBIC AND ANAEROBIC 5CC    Culture NO GROWTH < 12 HOURS    Report Status PENDING   I-stat troponin, ED     Status: None   Collection Time: 01/22/16  8:02 AM  Result Value Ref Range   Troponin i, poc 0.01 0.00 - 0.08 ng/mL   Comment 3            Comment: Due to the release kinetics of cTnI, a negative result within the first hours of the onset of symptoms does not rule out myocardial infarction with certainty. If myocardial infarction is still suspected, repeat the test at appropriate intervals.   I-Stat CG4 Lactic Acid, ED     Status: None   Collection Time: 01/22/16  8:04 AM  Result Value Ref Range   Lactic Acid, Venous 0.72 0.5 - 1.9 mmol/L  Urinalysis, Routine w reflex microscopic     Status:  Abnormal   Collection Time: 01/22/16  8:30 AM  Result Value Ref Range   Color, Urine YELLOW YELLOW   APPearance CLEAR CLEAR   Specific Gravity, Urine 1.013 1.005 - 1.030   pH 5.5 5.0 - 8.0   Glucose, UA NEGATIVE NEGATIVE mg/dL   Hgb urine dipstick NEGATIVE NEGATIVE   Bilirubin Urine NEGATIVE NEGATIVE   Ketones, ur NEGATIVE NEGATIVE mg/dL   Protein, ur 100 (A) NEGATIVE mg/dL   Nitrite NEGATIVE NEGATIVE   Leukocytes, UA NEGATIVE NEGATIVE  Urine microscopic-add on     Status: Abnormal   Collection Time: 01/22/16  8:30 AM  Result Value Ref Range   Squamous Epithelial / LPF 0-5 (A) NONE SEEN   WBC, UA 0-5 0 - 5 WBC/hpf   RBC / HPF NONE SEEN 0 - 5 RBC/hpf   Bacteria, UA RARE (A) NONE SEEN   Casts HYALINE CASTS (A) NEGATIVE  CBC     Status: Abnormal   Collection Time: 01/22/16 10:01 AM  Result Value Ref Range   WBC 4.3 4.0 - 10.5 K/uL   RBC 4.66 4.22 - 5.81 MIL/uL   Hemoglobin 12.1 (L) 13.0 - 17.0 g/dL   HCT 38.2 (L) 39.0 - 52.0 %   MCV 82.0 78.0 - 100.0 fL   MCH 26.0 26.0 -  34.0 pg   MCHC 31.7 30.0 - 36.0 g/dL   RDW 16.1 (H) 11.5 - 15.5 %   Platelets 160 150 - 400 K/uL  Creatinine, serum     Status: Abnormal   Collection Time: 01/22/16 10:01 AM  Result Value Ref Range   Creatinine, Ser 1.35 (H) 0.61 - 1.24 mg/dL   GFR calc non Af Amer 52 (L) >60 mL/min   GFR calc Af Amer 60 (L) >60 mL/min    Comment: (NOTE) The eGFR has been calculated using the CKD EPI equation. This calculation has not been validated in all clinical situations. eGFR's persistently <60 mL/min signify possible Chronic Kidney Disease.   Magnesium     Status: Abnormal   Collection Time: 01/22/16 10:01 AM  Result Value Ref Range   Magnesium 1.6 (L) 1.7 - 2.4 mg/dL  TSH     Status: None   Collection Time: 01/22/16 10:03 AM  Result Value Ref Range   TSH 1.214 0.350 - 4.500 uIU/mL  Troponin I     Status: Abnormal   Collection Time: 01/22/16  1:46 PM  Result Value Ref Range   Troponin I 0.03 (HH) <0.03  ng/mL    Comment: CRITICAL RESULT CALLED TO, READ BACK BY AND VERIFIED WITH: NICOLE STEPHENS,RN AT 1443 01/22/16 BY ZBEECH.   Urinalysis, Routine w reflex microscopic (not at [facility])     Status: Abnormal   Collection Time: 01/22/16  6:38 PM  Result Value Ref Range   Color, Urine YELLOW YELLOW   APPearance CLEAR CLEAR   Specific Gravity, Urine 1.011 1.005 - 1.030   pH 6.0 5.0 - 8.0   Glucose, UA NEGATIVE NEGATIVE mg/dL   Hgb urine dipstick NEGATIVE NEGATIVE   Bilirubin Urine NEGATIVE NEGATIVE   Ketones, ur NEGATIVE NEGATIVE mg/dL   Protein, ur 100 (A) NEGATIVE mg/dL   Nitrite NEGATIVE NEGATIVE   Leukocytes, UA NEGATIVE NEGATIVE  Urine microscopic-add on     Status: Abnormal   Collection Time: 01/22/16  6:38 PM  Result Value Ref Range   Squamous Epithelial / LPF NONE SEEN NONE SEEN   WBC, UA 0-5 0 - 5 WBC/hpf   RBC / HPF 0-5 0 - 5 RBC/hpf   Bacteria, UA NONE SEEN NONE SEEN   Casts HYALINE CASTS (A) NEGATIVE    Comment: GRANULAR CAST  Comprehensive metabolic panel     Status: Abnormal   Collection Time: 01/23/16  1:56 AM  Result Value Ref Range   Sodium 143 135 - 145 mmol/L   Potassium 3.4 (L) 3.5 - 5.1 mmol/L    Comment: DELTA CHECK NOTED   Chloride 111 101 - 111 mmol/L   CO2 27 22 - 32 mmol/L   Glucose, Bld 86 65 - 99 mg/dL   BUN 7 6 - 20 mg/dL   Creatinine, Ser 1.23 0.61 - 1.24 mg/dL   Calcium 8.7 (L) 8.9 - 10.3 mg/dL   Total Protein 5.1 (L) 6.5 - 8.1 g/dL   Albumin 3.1 (L) 3.5 - 5.0 g/dL   AST 47 (H) 15 - 41 U/L   ALT 68 (H) 17 - 63 U/L   Alkaline Phosphatase 73 38 - 126 U/L   Total Bilirubin 0.4 0.3 - 1.2 mg/dL   GFR calc non Af Amer 58 (L) >60 mL/min   GFR calc Af Amer >60 >60 mL/min    Comment: (NOTE) The eGFR has been calculated using the CKD EPI equation. This calculation has not been validated in all clinical situations. eGFR's persistently <60 mL/min  signify possible Chronic Kidney Disease.    Anion gap 5 5 - 15  CBC     Status: Abnormal   Collection Time:  01/23/16  1:56 AM  Result Value Ref Range   WBC 5.2 4.0 - 10.5 K/uL   RBC 4.58 4.22 - 5.81 MIL/uL   Hemoglobin 11.8 (L) 13.0 - 17.0 g/dL   HCT 37.7 (L) 39.0 - 52.0 %   MCV 82.3 78.0 - 100.0 fL   MCH 25.8 (L) 26.0 - 34.0 pg   MCHC 31.3 30.0 - 36.0 g/dL   RDW 16.2 (H) 11.5 - 15.5 %   Platelets 147 (L) 150 - 400 K/uL     Lipid Panel     Component Value Date/Time   CHOL  01/06/2010 0355    112        ATP III CLASSIFICATION:  <200     mg/dL   Desirable  200-239  mg/dL   Borderline High  >=240    mg/dL   High          TRIG 388 (H) 01/06/2010 0355   HDL 32 (L) 01/06/2010 0355   CHOLHDL 3.5 01/06/2010 0355   VLDL 78 (H) 01/06/2010 0355   LDLCALC  01/06/2010 0355    2        Total Cholesterol/HDL:CHD Risk Coronary Heart Disease Risk Table                     Men   Women  1/2 Average Risk   3.4   3.3  Average Risk       5.0   4.4  2 X Average Risk   9.6   7.1  3 X Average Risk  23.4   11.0        Use the calculated Patient Ratio above and the CHD Risk Table to determine the patient's CHD Risk.        ATP III CLASSIFICATION (LDL):  <100     mg/dL   Optimal  100-129  mg/dL   Near or Above                    Optimal  130-159  mg/dL   Borderline  160-189  mg/dL   High  >190     mg/dL   Very High     Lab Results  Component Value Date   HGBA1C  01/05/2010    5.6 (NOTE)                                                                       According to the ADA Clinical Practice Recommendations for 2011, when HbA1c is used as a screening test:   >=6.5%   Diagnostic of Diabetes Mellitus           (if abnormal result  is confirmed)  5.7-6.4%   Increased risk of developing Diabetes Mellitus  References:Diagnosis and Classification of Diabetes Mellitus,Diabetes EZMO,2947,65(YYTKP 1):S62-S69 and Standards of Medical Care in         Diabetes - 2011,Diabetes Care,2011,34  (Suppl 1):S11-S61.     Lab Results  Component Value Date   [facility]  01/06/2010    2        Total  Cholesterol/HDL:CHD  Risk Coronary Heart Disease Risk Table                     Men   Women  1/2 Average Risk   3.4   3.3  Average Risk       5.0   4.4  2 X Average Risk   9.6   7.1  3 X Average Risk  23.4   11.0        Use the calculated Patient Ratio above and the CHD Risk Table to determine the patient's CHD Risk.        ATP III CLASSIFICATION (LDL):  <100     mg/dL   Optimal  100-129  mg/dL   Near or Above                    Optimal  130-159  mg/dL   Borderline  160-189  mg/dL   High  >190     mg/dL   Very High   CREATININE 1.23 01/23/2016     HPI  70 year old male with CML, followed by Ladell Pier, MD  on Gleevec, chronic pancreatitis, known to have chronic diarrhea for which he takes Lomotil at home presents to the ER with 3 days of diarrhea and intractable nausea vomiting which started this morning. Patient feels that he is dehydrated and needs to receive IV hydration. Complains of generalized weakness. Denies any fever, chills. Denies any chest pain shortness of breath. In the ER patient's blood pressure is 173 by 1:01 PM 96% on room air, pulse 78, afebrile. Troponin 0.01. Lactic acid 0.72. Potassium 2.7    HOSPITAL COURSE:     Dehydration-/intractable nausea vomiting? related to chronic pancreatitis versus Gleevec. He takes Lomotil as needed. Stool studies ordered but not collected as the diarrhea was thought to be secondary to [place] Initiated on low due to history of chronic pancreatitis  [place] ,  Cannot rule out viral gastroenteritis Receive supportive care with IV fluids, supplementation of potassium and magnesium Resume low maternal   Hypokalemia-repleted aggressively but prior to discharge      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HCC)-diagnosed in January of 2009. He remains in hematologic remission. He is taking Gleevec at a dose of 200 mg daily. Last seen by oncology on 12/22/15     Carotid artery aneurysm (HCC)-Evaluated by vascular surgery status post CT  angiogram 03/07/2014 with no carotid artery aneurysm identified.    Essential hypertension-Continue home medications, discontinued lisinopril/HCTZ secondary to hypokalemia    Tobacco abuse-Continues to smoke, chest x-ray negative for pneumonia    Alcohol use, 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HCC)-no signs of withdrawal  History of gout-held colchicine in the setting of diarrhea  Discharge Exam:   Blood pressure (!) 152/87, pulse 75, temperature 97.8 F (36.6 C), temperature source Oral, resp. rate 18, height '5\' 8"'  (1.727 m), weight 66.6 kg (146 lb 14.4 oz), SpO2 98 %.  ENMT: Mucous membranes are moist. Posterior pharynx clear of any exudate or lesions.Normal dentition.  Neck: normal, supple, no masses, no thyromegaly Respiratory: clear to auscultation bilaterally, no wheezing, no crackles. Normal respiratory effort. No accessory muscle use.  Cardiovascular: Regular rate and rhythm, no murmurs / rubs / gallops. No extremity edema. 2+ pedal pulses. No carotid bruits.  Abdomen: no tenderness, no masses palpated. No hepatosplenomegaly. Bowel sounds positive.  Musculoskeletal: no clubbing / cyanosis. No joint deformity upper and lower extremities. Good ROM, no contractures. Normal muscle tone.  Skin: no rashes, lesions, ulcers.  No induration Neurologic: CN 2-12 grossly intact. Sensation intact, DTR normal. Strength 5/5 in all 4.  Psychiatric: Normal judgment and insight. Alert and oriented x 3. Normal mood.     Follow-up Information    Philis Fendt, MD. Schedule an appointment as soon as possible for a visit in 2 day(s).   Specialty:  Internal Medicine Why:  Hospital follow-up Contact information: [place] [address] [phone]           Signed: Reyne Dumas 01/23/2016, 8:42 AM        Time spent >45 mins

## 2016-01-27 LAB — CULTURE, BLOOD (ROUTINE X 2)
Culture: NO GROWTH
Culture: NO GROWTH

## 2016-02-02 ENCOUNTER — Other Ambulatory Visit: Payer: Self-pay

## 2016-02-02 ENCOUNTER — Ambulatory Visit: Payer: Self-pay | Admitting: Nurse Practitioner

## 2016-02-08 ENCOUNTER — Telehealth: Payer: Self-pay | Admitting: Nurse Practitioner

## 2016-02-08 NOTE — Telephone Encounter (Signed)
09/26 Appointment rescheduled to 10/09 per patient request. Patient requested late afternoon appointment.

## 2016-02-15 ENCOUNTER — Ambulatory Visit: Payer: Self-pay | Admitting: Nurse Practitioner

## 2016-02-15 ENCOUNTER — Other Ambulatory Visit (HOSPITAL_BASED_OUTPATIENT_CLINIC_OR_DEPARTMENT_OTHER): Payer: Medicare Other

## 2016-02-15 ENCOUNTER — Other Ambulatory Visit: Payer: Self-pay

## 2016-02-15 ENCOUNTER — Ambulatory Visit (HOSPITAL_BASED_OUTPATIENT_CLINIC_OR_DEPARTMENT_OTHER): Payer: Medicare Other | Admitting: Nurse Practitioner

## 2016-02-15 VITALS — BP 159/85 | HR 73 | Temp 98.4°F | Resp 17 | Ht 68.0 in | Wt 153.9 lb

## 2016-02-15 DIAGNOSIS — C9211 Chronic myeloid leukemia, BCR/ABL-positive, in remission: Secondary | ICD-10-CM

## 2016-02-15 DIAGNOSIS — K861 Other chronic pancreatitis: Secondary | ICD-10-CM | POA: Diagnosis not present

## 2016-02-15 DIAGNOSIS — C9210 Chronic myeloid leukemia, BCR/ABL-positive, not having achieved remission: Secondary | ICD-10-CM

## 2016-02-15 LAB — CBC WITH DIFFERENTIAL/PLATELET
BASO%: 0.4 % (ref 0.0–2.0)
BASOS ABS: 0 10*3/uL (ref 0.0–0.1)
EOS%: 6.7 % (ref 0.0–7.0)
Eosinophils Absolute: 0.3 10*3/uL (ref 0.0–0.5)
HEMATOCRIT: 37.9 % — AB (ref 38.4–49.9)
HGB: 12.6 g/dL — ABNORMAL LOW (ref 13.0–17.1)
LYMPH#: 1.2 10*3/uL (ref 0.9–3.3)
LYMPH%: 26.2 % (ref 14.0–49.0)
MCH: 26.6 pg — AB (ref 27.2–33.4)
MCHC: 33.2 g/dL (ref 32.0–36.0)
MCV: 80 fL (ref 79.3–98.0)
MONO#: 0.4 10*3/uL (ref 0.1–0.9)
MONO%: 8.4 % (ref 0.0–14.0)
NEUT#: 2.7 10*3/uL (ref 1.5–6.5)
NEUT%: 58.3 % (ref 39.0–75.0)
PLATELETS: 150 10*3/uL (ref 140–400)
RBC: 4.74 10*6/uL (ref 4.20–5.82)
RDW: 16.4 % — ABNORMAL HIGH (ref 11.0–14.6)
WBC: 4.7 10*3/uL (ref 4.0–10.3)

## 2016-02-15 LAB — COMPREHENSIVE METABOLIC PANEL
ALT: 28 U/L (ref 0–55)
ANION GAP: 10 meq/L (ref 3–11)
AST: 31 U/L (ref 5–34)
Albumin: 3.3 g/dL — ABNORMAL LOW (ref 3.5–5.0)
Alkaline Phosphatase: 104 U/L (ref 40–150)
BUN: 6.9 mg/dL — ABNORMAL LOW (ref 7.0–26.0)
CALCIUM: 8.9 mg/dL (ref 8.4–10.4)
CHLORIDE: 108 meq/L (ref 98–109)
CO2: 25 meq/L (ref 22–29)
CREATININE: 1.7 mg/dL — AB (ref 0.7–1.3)
EGFR: 48 mL/min/{1.73_m2} — AB (ref >90)
Glucose: 101 mg/dl (ref 70–140)
POTASSIUM: 3.6 meq/L (ref 3.5–5.1)
Sodium: 143 mEq/L (ref 136–145)
Total Bilirubin: 0.46 mg/dL (ref 0.20–1.20)
Total Protein: 5.9 g/dL — ABNORMAL LOW (ref 6.4–8.3)

## 2016-02-15 NOTE — Progress Notes (Signed)
[facility] OFFICE PROGRESS NOTE   Diagnosis:  CML  INTERVAL HISTORY:   [name] returns for a follow-up visit. He continues [place]. He was hospitalized 01/22/2016 with diarrhea and intractable nausea/vomiting. He was discharged home the following day.  He continues to have mild intermittent nausea and loose stools. He takes Lomotil as needed with good results.  Objective:  Vital signs in last 24 hours:  Blood pressure (!) 159/85, pulse 73, temperature 98.4 F (36.9 C), temperature source Oral, resp. rate 17, height 5\' 8"  (1.727 m), weight 153 lb 14.4 oz (69.8 kg), SpO2 100 %.    HEENT: No thrush or ulcers. Resp: Lungs clear bilaterally. Cardio: Regular rate and rhythm. GI: Abdomen soft and nontender. No organomegaly. Vascular: No left leg edema.   Lab Results:  Lab Results  Component Value Date   WBC 4.7 02/15/2016   HGB 12.6 (L) 02/15/2016   HCT 37.9 (L) 02/15/2016   MCV 80.0 02/15/2016   PLT 150 02/15/2016   NEUTROABS 2.7 02/15/2016    Imaging:  No results found.  Medications: I have reviewed the patient's current medications.  Assessment/Plan: 1. Chronic myelogenous leukemia, diagnosed in January of 2009. He remains in hematologic remission. He is taking Gleevec at a dose of 200 mg daily. The peripheral blood PCR was markedly increased in May 2013, likely reflecting medical noncompliance. The peripheral blood PCR was significantly improved on 11/07/2011. The peripheral blood PCR was further improved on 01/31/2012. The peripheral blood PCR was increased on 04/27/2012; stable on 11/12/2012, improved on 02/12/2013; increased on 04/29/2013; increased on 07/10/2013; improved on 09/16/2013; improved on 10/28/2013; increased 02/17/2014; slightly improved 03/28/2014. Stable 04/29/2014. Increased 05/30/2014. Improved 07/26/2014. Increased 07/14/2015. 2. 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 3. Status post C3-C4, C4-C5, anterior cervical diskectomy  and fusion with allograft and plating 09/30/2010. 4. 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 5. Depression 6. Diarrhea, ? related to chronic pancreatitis versus Gleevec. He takes Lomotil as needed.  7. Indurated facial skin lesion 11/20/2007 with a history of MRSA skin infection of the submental area in May 2008. The induration resolved with doxycycline. 8. Left knee arthroscopy 05/25/2007. 9. Postoperative left knee effusion/pain, likely related to gout. 10. History of gout.  11. Chronic pancreatitis. 12. Status post right above-the-knee amputation. 13. MRSA infection of the submental area May 2008. 14. History of tobacco, alcohol, and cocaine use. 15. History of coronary artery disease. 16. "Shotty" lymphadenopathy of the neck, axilla, and left groin in 2009.  17. History of a microcytic anemia.  18. History of a right olecranon bursa lesion, ? gouty tophus. 19. 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 He previously took AndroGel. 20. History of anemia secondary to chronic disease and Gleevec therapy.  21. history ofAnorexia-potentially related to [place]. He reports Medicaid would not pay for Megace.  22. Chronic left knee and left foot pain.  23. Status post removal of a left knee screw 07/03/2012. 24. History of Hypokalemia. Likely related to diarrhea. 25. Status post left foot surgery. 26. Pulsatile fullness right neck. Evaluated by vascular surgery status post CT angiogram 03/07/2014 with no carotid artery aneurysm identified. 27. Cough-abnormal lung exam 05/12/2015. He completed a course of Levaquin. Chest xray negative.   Disposition: [name] appears unchanged. He will continue Gleevec. We will follow-up on the peripheral blood PCR from today. He will return for a follow-up visit in approximately 6 weeks.  Plan reviewed with Dr. [name].  [name],  [name] ANP/GNP-BC   02/15/2016  2:20 PM

## 2016-02-16 ENCOUNTER — Telehealth: Payer: Self-pay | Admitting: Oncology

## 2016-02-16 NOTE — Telephone Encounter (Signed)
Appointments confirmed with patient,per 02/15/16 los.

## 2016-02-17 ENCOUNTER — Other Ambulatory Visit: Payer: Self-pay | Admitting: *Deleted

## 2016-02-17 DIAGNOSIS — C9210 Chronic myeloid leukemia, BCR/ABL-positive, not having achieved remission: Secondary | ICD-10-CM

## 2016-02-17 MED ORDER — IMATINIB MESYLATE 100 MG PO TABS: 200.0000 mg | ORAL_TABLET | Freq: Every day | ORAL | 3 refills | Status: DC

## 2016-02-22 ENCOUNTER — Other Ambulatory Visit: Payer: Self-pay | Admitting: Oncology

## 2016-03-14 ENCOUNTER — Encounter (HOSPITAL_COMMUNITY): Payer: Self-pay | Admitting: Emergency Medicine

## 2016-03-14 ENCOUNTER — Emergency Department (HOSPITAL_COMMUNITY): Payer: Medicare Other

## 2016-03-14 ENCOUNTER — Inpatient Hospital Stay (HOSPITAL_COMMUNITY)
Admission: EM | Admit: 2016-03-14 | Discharge: 2016-03-16 | DRG: 439 | Disposition: A | Discharge status: Home / Self Care | Payer: Medicare Other | Attending: Internal Medicine | Admitting: Internal Medicine

## 2016-03-14 DIAGNOSIS — T451X5A Adverse effect of antineoplastic and immunosuppressive drugs, initial encounter: Secondary | ICD-10-CM | POA: Diagnosis present

## 2016-03-14 DIAGNOSIS — K089 Disorder of teeth and supporting structures, unspecified: Secondary | ICD-10-CM

## 2016-03-14 DIAGNOSIS — K529 Noninfective gastroenteritis and colitis, unspecified: Secondary | ICD-10-CM | POA: Diagnosis present

## 2016-03-14 DIAGNOSIS — Z720 Tobacco use: Secondary | ICD-10-CM | POA: Diagnosis present

## 2016-03-14 DIAGNOSIS — K861 Other chronic pancreatitis: Secondary | ICD-10-CM | POA: Diagnosis present

## 2016-03-14 DIAGNOSIS — I10 Essential (primary) hypertension: Secondary | ICD-10-CM

## 2016-03-14 DIAGNOSIS — E876 Hypokalemia: Secondary | ICD-10-CM | POA: Diagnosis present

## 2016-03-14 DIAGNOSIS — I252 Old myocardial infarction: Secondary | ICD-10-CM

## 2016-03-14 DIAGNOSIS — K859 Acute pancreatitis without necrosis or infection, unspecified: Secondary | ICD-10-CM | POA: Diagnosis present

## 2016-03-14 DIAGNOSIS — F17210 Nicotine dependence, cigarettes, uncomplicated: Secondary | ICD-10-CM | POA: Diagnosis present

## 2016-03-14 DIAGNOSIS — F329 Major depressive disorder, single episode, unspecified: Secondary | ICD-10-CM | POA: Diagnosis present

## 2016-03-14 DIAGNOSIS — Z96652 Presence of left artificial knee joint: Secondary | ICD-10-CM | POA: Diagnosis present

## 2016-03-14 DIAGNOSIS — Z9861 Coronary angioplasty status: Secondary | ICD-10-CM

## 2016-03-14 DIAGNOSIS — N179 Acute kidney failure, unspecified: Secondary | ICD-10-CM | POA: Diagnosis present

## 2016-03-14 DIAGNOSIS — R109 Unspecified abdominal pain: Secondary | ICD-10-CM | POA: Diagnosis present

## 2016-03-14 DIAGNOSIS — I129 Hypertensive chronic kidney disease with stage 1 through stage 4 chronic kidney disease, or unspecified chronic kidney disease: Secondary | ICD-10-CM | POA: Diagnosis present

## 2016-03-14 DIAGNOSIS — C9210 Chronic myeloid leukemia, BCR/ABL-positive, not having achieved remission: Secondary | ICD-10-CM | POA: Diagnosis not present

## 2016-03-14 DIAGNOSIS — Z79899 Other long term (current) drug therapy: Secondary | ICD-10-CM | POA: Diagnosis not present

## 2016-03-14 DIAGNOSIS — K8500 Idiopathic acute pancreatitis without necrosis or infection: Secondary | ICD-10-CM

## 2016-03-14 DIAGNOSIS — M109 Gout, unspecified: Secondary | ICD-10-CM | POA: Diagnosis present

## 2016-03-14 DIAGNOSIS — N183 Chronic kidney disease, stage 3 (moderate): Secondary | ICD-10-CM | POA: Diagnosis present

## 2016-03-14 DIAGNOSIS — Z89611 Acquired absence of right leg above knee: Secondary | ICD-10-CM | POA: Diagnosis not present

## 2016-03-14 DIAGNOSIS — R197 Diarrhea, unspecified: Secondary | ICD-10-CM | POA: Diagnosis present

## 2016-03-14 DIAGNOSIS — C9211 Chronic myeloid leukemia, BCR/ABL-positive, in remission: Secondary | ICD-10-CM | POA: Diagnosis present

## 2016-03-14 DIAGNOSIS — K858 Other acute pancreatitis without necrosis or infection: Secondary | ICD-10-CM

## 2016-03-14 LAB — CBC WITH DIFFERENTIAL/PLATELET
BASOS ABS: 0 10*3/uL (ref 0.0–0.1)
BASOS PCT: 0 %
EOS ABS: 0.4 10*3/uL (ref 0.0–0.7)
Eosinophils Relative: 7 %
HCT: 39.1 % (ref 39.0–52.0)
HEMOGLOBIN: 12.9 g/dL — AB (ref 13.0–17.0)
Lymphocytes Relative: 25 %
Lymphs Abs: 1.7 10*3/uL (ref 0.7–4.0)
MCH: 26.2 pg (ref 26.0–34.0)
MCHC: 33 g/dL (ref 30.0–36.0)
MCV: 79.5 fL (ref 78.0–100.0)
Monocytes Absolute: 0.5 10*3/uL (ref 0.1–1.0)
Monocytes Relative: 7 %
NEUTROS ABS: 4.1 10*3/uL (ref 1.7–7.7)
NEUTROS PCT: 61 %
Platelets: 158 10*3/uL (ref 150–400)
RBC: 4.92 MIL/uL (ref 4.22–5.81)
RDW: 17 % — AB (ref 11.5–15.5)
WBC: 6.8 10*3/uL (ref 4.0–10.5)

## 2016-03-14 LAB — COMPREHENSIVE METABOLIC PANEL
ALBUMIN: 3.3 g/dL — AB (ref 3.5–5.0)
ALK PHOS: 72 U/L (ref 38–126)
ALT: 24 U/L (ref 17–63)
ANION GAP: 9 (ref 5–15)
AST: 27 U/L (ref 15–41)
BILIRUBIN TOTAL: 0.4 mg/dL (ref 0.3–1.2)
BUN: 9 mg/dL (ref 6–20)
CO2: 30 mmol/L (ref 22–32)
Calcium: 9 mg/dL (ref 8.9–10.3)
Chloride: 101 mmol/L (ref 101–111)
Creatinine, Ser: 1.48 mg/dL — ABNORMAL HIGH (ref 0.61–1.24)
GFR calc Af Amer: 54 mL/min — ABNORMAL LOW (ref >60)
GFR calc non Af Amer: 46 mL/min — ABNORMAL LOW (ref >60)
GLUCOSE: 108 mg/dL — AB (ref 65–99)
POTASSIUM: 3.2 mmol/L — AB (ref 3.5–5.1)
SODIUM: 140 mmol/L (ref 135–145)
TOTAL PROTEIN: 5.5 g/dL — AB (ref 6.5–8.1)

## 2016-03-14 LAB — LIPASE, BLOOD: Lipase: 104 U/L — ABNORMAL HIGH (ref 11–51)

## 2016-03-14 LAB — URINALYSIS, ROUTINE W REFLEX MICROSCOPIC
Bilirubin Urine: NEGATIVE
GLUCOSE, UA: NEGATIVE mg/dL
Hgb urine dipstick: NEGATIVE
KETONES UR: NEGATIVE mg/dL
LEUKOCYTES UA: NEGATIVE
NITRITE: NEGATIVE
PH: 7 (ref 5.0–8.0)
Protein, ur: 100 mg/dL — AB
SPECIFIC GRAVITY, URINE: 1.007 (ref 1.005–1.030)

## 2016-03-14 LAB — URINE MICROSCOPIC-ADD ON
SQUAMOUS EPITHELIAL / LPF: NONE SEEN
WBC UA: NONE SEEN WBC/hpf (ref 0–5)

## 2016-03-14 LAB — I-STAT TROPONIN, ED: Troponin i, poc: 0 ng/mL (ref 0.00–0.08)

## 2016-03-14 LAB — MAGNESIUM: MAGNESIUM: 1.9 mg/dL (ref 1.7–2.4)

## 2016-03-14 LAB — LIPID PANEL
CHOL/HDL RATIO: 4.4 ratio
Cholesterol: 109 mg/dL (ref 0–200)
HDL: 25 mg/dL — AB (ref >40)
LDL CALC: 52 mg/dL (ref 0–99)
Triglycerides: 160 mg/dL — ABNORMAL HIGH (ref <150)
VLDL: 32 mg/dL (ref 0–40)

## 2016-03-14 LAB — GLUCOSE, CAPILLARY: Glucose-Capillary: 105 mg/dL — ABNORMAL HIGH (ref 65–99)

## 2016-03-14 MED ORDER — MORPHINE SULFATE (PF) 4 MG/ML IV SOLN
4.0000 mg | Freq: Once | INTRAVENOUS | Status: AC
  Administered 2016-03-14: 4 mg via INTRAVENOUS
  Filled 2016-03-14: qty 1

## 2016-03-14 MED ORDER — ENOXAPARIN SODIUM 40 MG/0.4ML ~~LOC~~ SOLN
40.0000 mg | SUBCUTANEOUS | Status: DC
  Administered 2016-03-14 – 2016-03-15 (×2): 40 mg via SUBCUTANEOUS
  Filled 2016-03-14 (×2): qty 0.4

## 2016-03-14 MED ORDER — ONDANSETRON HCL 4 MG/2ML IJ SOLN
4.0000 mg | Freq: Four times a day (QID) | INTRAMUSCULAR | Status: DC | PRN
  Administered 2016-03-15: 4 mg via INTRAVENOUS
  Filled 2016-03-14 (×2): qty 2

## 2016-03-14 MED ORDER — POTASSIUM CHLORIDE 20 MEQ/15ML (10%) PO SOLN
40.0000 meq | Freq: Every day | ORAL | Status: DC
  Administered 2016-03-14 – 2016-03-16 (×3): 40 meq via ORAL
  Filled 2016-03-14 (×4): qty 30

## 2016-03-14 MED ORDER — MORPHINE SULFATE (PF) 4 MG/ML IV SOLN
1.0000 mg | INTRAVENOUS | Status: DC | PRN
  Administered 2016-03-14 (×4): 1 mg via INTRAVENOUS
  Filled 2016-03-14 (×4): qty 1

## 2016-03-14 MED ORDER — ONDANSETRON HCL 4 MG/2ML IJ SOLN
4.0000 mg | Freq: Once | INTRAMUSCULAR | Status: AC
  Administered 2016-03-14: 4 mg via INTRAVENOUS
  Filled 2016-03-14: qty 2

## 2016-03-14 MED ORDER — ALBUTEROL SULFATE (2.5 MG/3ML) 0.083% IN NEBU: 3.0000 mL | INHALATION_SOLUTION | Freq: Four times a day (QID) | RESPIRATORY_TRACT | Status: DC | PRN

## 2016-03-14 MED ORDER — ALBUTEROL SULFATE HFA 108 (90 BASE) MCG/ACT IN AERS: 2.0000 | INHALATION_SPRAY | Freq: Four times a day (QID) | RESPIRATORY_TRACT | Status: DC | PRN

## 2016-03-14 MED ORDER — ONDANSETRON HCL 4 MG PO TABS: 4.0000 mg | ORAL_TABLET | Freq: Four times a day (QID) | ORAL | Status: DC | PRN

## 2016-03-14 MED ORDER — SODIUM CHLORIDE 0.9 % IV SOLN
INTRAVENOUS | Status: DC
Start: 2016-03-14 — End: 2016-03-15
  Administered 2016-03-14 – 2016-03-15 (×2): via INTRAVENOUS

## 2016-03-14 MED ORDER — AMLODIPINE BESYLATE 10 MG PO TABS
10.0000 mg | ORAL_TABLET | Freq: Every day | ORAL | Status: DC
  Administered 2016-03-14 – 2016-03-16 (×3): 10 mg via ORAL
  Filled 2016-03-14 (×5): qty 1

## 2016-03-14 MED ORDER — SODIUM CHLORIDE 0.9 % IV SOLN
INTRAVENOUS | Status: DC
  Administered 2016-03-14 (×2): via INTRAVENOUS

## 2016-03-14 MED ORDER — METOPROLOL TARTRATE 5 MG/5ML IV SOLN
5.0000 mg | Freq: Three times a day (TID) | INTRAVENOUS | Status: DC
  Administered 2016-03-14 – 2016-03-16 (×6): 5 mg via INTRAVENOUS
  Filled 2016-03-14 (×6): qty 5
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
## 2016-03-14 NOTE — ED Triage Notes (Signed)
Brought by ems from home for c/o diffuse abdominal pain and epigastric pain.  Reports that the epigastric pain started first now entire stomach is aching on and off.  States it feels like "something is moving around in my stomach."  Reports that medication for leukemia gives him diarrhea.  Takes anti diarrheal pills.
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
## 2016-03-15 DIAGNOSIS — Z720 Tobacco use: Secondary | ICD-10-CM

## 2016-03-15 DIAGNOSIS — R197 Diarrhea, unspecified: Secondary | ICD-10-CM

## 2016-03-15 LAB — CBC
HEMATOCRIT: 37.3 % — AB (ref 39.0–52.0)
HEMOGLOBIN: 12 g/dL — AB (ref 13.0–17.0)
MCH: 26 pg (ref 26.0–34.0)
MCHC: 32.2 g/dL (ref 30.0–36.0)
MCV: 80.7 fL (ref 78.0–100.0)
Platelets: 134 10*3/uL — ABNORMAL LOW (ref 150–400)
RBC: 4.62 MIL/uL (ref 4.22–5.81)
RDW: 17 % — ABNORMAL HIGH (ref 11.5–15.5)
WBC: 5.6 10*3/uL (ref 4.0–10.5)

## 2016-03-15 LAB — BASIC METABOLIC PANEL
ANION GAP: 10 (ref 5–15)
BUN: 9 mg/dL (ref 6–20)
CHLORIDE: 106 mmol/L (ref 101–111)
CO2: 25 mmol/L (ref 22–32)
Calcium: 8.3 mg/dL — ABNORMAL LOW (ref 8.9–10.3)
Creatinine, Ser: 1.28 mg/dL — ABNORMAL HIGH (ref 0.61–1.24)
GFR calc Af Amer: >60 mL/min (ref >60)
GFR, EST NON AFRICAN AMERICAN: 55 mL/min — AB (ref >60)
GLUCOSE: 69 mg/dL (ref 65–99)
POTASSIUM: 3.1 mmol/L — AB (ref 3.5–5.1)
Sodium: 141 mmol/L (ref 135–145)

## 2016-03-15 LAB — LIPID PANEL
Cholesterol: 107 mg/dL (ref 0–200)
HDL: 22 mg/dL — ABNORMAL LOW (ref >40)
LDL CALC: 59 mg/dL (ref 0–99)
TRIGLYCERIDES: 129 mg/dL (ref <150)
Total CHOL/HDL Ratio: 4.9 RATIO
VLDL: 26 mg/dL (ref 0–40)

## 2016-03-15 LAB — MAGNESIUM: Magnesium: 1.8 mg/dL (ref 1.7–2.4)

## 2016-03-15 MED ORDER — MORPHINE SULFATE (PF) 2 MG/ML IV SOLN
1.0000 mg | INTRAVENOUS | Status: DC | PRN
  Administered 2016-03-15 (×3): 2 mg via INTRAVENOUS
  Filled 2016-03-15 (×4): qty 1

## 2016-03-15 MED ORDER — SODIUM CHLORIDE 0.9 % IV BOLUS (SEPSIS)
1000.0000 mL | Freq: Once | INTRAVENOUS | Status: AC
  Administered 2016-03-15: 1000 mL via INTRAVENOUS

## 2016-03-15 MED ORDER — POTASSIUM CHLORIDE 20 MEQ/15ML (10%) PO SOLN
40.0000 meq | Freq: Once | ORAL | Status: AC
  Administered 2016-03-15: 40 meq via ORAL
  Filled 2016-03-15: qty 30

## 2016-03-15 MED ORDER — SODIUM CHLORIDE 0.9 % IV SOLN
INTRAVENOUS | Status: DC
  Administered 2016-03-15 – 2016-03-16 (×3): via INTRAVENOUS
  Filled 2016-03-15 (×7): qty 1000

## 2016-03-15 NOTE — Progress Notes (Signed)
PROGRESS NOTE    [name]  EQA:[id number] DOB: 1946/01/28 DOA: 03/14/2016 PCP: Philis Fendt, MD    Brief Narrative:  Patient is a 70 year old gentleman with history of CML currently remission presented to the ED with acute on chronic recurrent pancreatitis.   Assessment & Plan:   Principal Problem:   Pancreatitis Active Problems:   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HCC)   Essential hypertension   Tobacco abuse   Poor dentition   Acute kidney injury (HCC)   Abdominal pain   Hypokalemia   Diarrhea, unspecified  #1 acute on chronic pancreatitis Questionable etiology. Patient presented with abdominal pain radiating to the back noted to have an elevated lipase level. Abdominal ultrasound consistent with fatty infiltration of the liver with small right renal cyst no evidence of cholelithiasis or cholecystitis. LFTs within normal limits. Patient denies any recent alcohol use and states has a use a call in over a year. Fasting lipid panel with a triglyceride level of 160 and LDL of 52. Patient with clinical improvement. Given 1 L normal saline bolus. Increase IV fluids 150 mL per hour. Place on clear liquids and if tolerated will advance to full liquids for suppertime. Pain management. Supportive care.  #2 hypokalemia Replete.  #3 acute kidney injury Likely secondary to prerenal azotemia. Renal function improved with hydration. Follow.  #4 tobacco abuse Tobacco cessation.  #5 chronic diarrhea Stable. Secondary to chemotherapy medications ( Gleevac). Patient not on any antibiotics recently. No change in stool consistency or color. Lomotil as needed.  #6 CML Diagnosis degenerative 2009. Currently in remission. Patient and leave a daily. Gleevec on hold secondary to problem #1. Once tolerating a low-fat diet will resume Gleevec.   DVT prophylaxis: Lovenox Code Status: Full Family Communication: Updated patient. No family present. Disposition Plan: Home once acute pancreatitis has  improved and patient tolerating a low-fat diet. Hopefully in the next 24-48 hours.   Consultants:   None  Procedures:   Abdominal ultrasound 03/14/2016  Antimicrobials:   None   Subjective: Patient states abdominal pain improved, just received some pain medication. No nausea. No emesis.  Objective: Vitals:   03/14/16 2132 03/15/16 0547 03/15/16 0900 03/15/16 1407  BP: (!) 182/90 (!) 157/88 (!) 177/90 (!) 167/89  Pulse: 66 74 69 74  Resp: 18 16 18    Temp: 98 F (36.7 C) 98.5 F (36.9 C) 98.2 F (36.8 C)   TempSrc: Oral Oral Oral   SpO2: 93% 96% 95% 96%  Weight: 67.5 kg (148 lb 13 oz)     Height:        Intake/Output Summary (Last 24 hours) at 03/15/16 1735 Last data filed at 03/15/16 1258  Gross per 24 hour  Intake          2097.92 ml  Output             2095 ml  Net             2.92 ml   Filed Weights   03/14/16 0249 03/14/16 0819 03/14/16 2132  Weight: 65.8 kg (145 lb) 65.8 kg (145 lb) 67.5 kg (148 lb 13 oz)    Examination:  General exam: Appears calm and comfortable  Respiratory system: Clear to auscultation. Respiratory effort normal. Cardiovascular system: S1 & S2 heard, RRR. No JVD, murmurs, rubs, gallops or clicks. No pedal edema. Gastrointestinal system: Abdomen is nondistended, soft and nontender. No organomegaly or masses felt. Normal bowel sounds heard. Central nervous system: Alert and oriented. No focal neurological deficits. Extremities: Symmetric  5 x 5 power. Skin: No rashes, lesions or ulcers Psychiatry: Judgement and insight appear normal. Mood & affect appropriate.     Data Reviewed: I have personally reviewed following labs and imaging studies  CBC:  Recent Labs Lab 03/14/16 0308 03/15/16 0511  WBC 6.8 5.6  NEUTROABS 4.1  --   HGB 12.9* 12.0*  HCT 39.1 37.3*  MCV 79.5 80.7  PLT 158 604*   Basic Metabolic Panel:  Recent Labs Lab 03/14/16 0308 03/14/16 0743 03/15/16 0511  NA 140  --  141  K 3.2*  --  3.1*  CL 101  --   106  CO2 30  --  25  GLUCOSE 108*  --  69  BUN 9  --  9  CREATININE 1.48*  --  1.28*  CALCIUM 9.0  --  8.3*  MG  --  1.9 1.8   GFR: Estimated Creatinine Clearance: 51.3 mL/min (by C-G formula based on SCr of 1.28 mg/dL (H)). Liver Function Tests:  Recent Labs Lab 03/14/16 0308  AST 27  ALT 24  ALKPHOS 72  BILITOT 0.4  PROT 5.5*  ALBUMIN 3.3*    Recent Labs Lab 03/14/16 0308  LIPASE 104*   No results for input(s): AMMONIA in the last 168 hours. Coagulation Profile: No results for input(s): INR, PROTIME in the last 168 hours. Cardiac Enzymes: No results for input(s): CKTOTAL, CKMB, CKMBINDEX, TROPONINI in the last 168 hours. BNP (last 3 results) No results for input(s): PROBNP in the last 8760 hours. HbA1C: No results for input(s): HGBA1C in the last 72 hours. CBG:  Recent Labs Lab 03/14/16 0822  GLUCAP 105*   Lipid Profile:  Recent Labs  03/14/16 1015 03/15/16 0511  CHOL 109 107  HDL 25* 22*  LDLCALC 52 59  TRIG 160* 129  CHOLHDL 4.4 4.9   Thyroid Function Tests: No results for input(s): TSH, T4TOTAL, FREET4, T3FREE, THYROIDAB in the last 72 hours. Anemia Panel: No results for input(s): VITAMINB12, FOLATE, FERRITIN, TIBC, IRON, RETICCTPCT in the last 72 hours. Sepsis Labs: No results for input(s): PROCALCITON, LATICACIDVEN in the last 168 hours.  No results found for this or any previous visit (from the past 240 hour(s)).       Radiology Studies: US Abdomen Complete  Result Date: 03/14/2016 CLINICAL DATA:  Abdominal pain for 3 days. Diarrhea. History of hypertension, leukemia, pancreatitis. EXAM: ABDOMEN ULTRASOUND COMPLETE COMPARISON:  CT abdomen and pelvis 07/05/2014 FINDINGS: Gallbladder: No gallstones or wall thickening visualized. No sonographic Murphy sign noted by sonographer. Common bile duct: Diameter: 4.8 mm, normal Liver: Limited visualization due to bowel gas. Diffusely increased parenchymal echotexture suggesting fatty infiltration.  IVC: No abnormality visualized. Pancreas: Not visualized due to overlying bowel gas. Spleen: Size and appearance within normal limits. Right Kidney: Length: 9.7 cm. Small cyst in the lower pole measuring 1.1 cm maximal diameter. No hydronephrosis. Left Kidney: Length: 10.1 cm. Echogenicity within normal limits. No mass or hydronephrosis visualized. Abdominal aorta: Visualized portion demonstrates no evidence of aneurysm. Atherosclerotic calcification. Other findings: None. IMPRESSION: Examination is limited due to bowel gas. Diffuse fatty infiltration of the liver. Small right renal cyst. No evidence of cholelithiasis or cholecystitis. Electronically Signed   By: Lucienne Capers M.D.   On: 03/14/2016 04:47        Scheduled Meds: . amLODipine  10 mg Oral Daily  . enoxaparin (LOVENOX) injection  40 mg Subcutaneous Q24H  . metoprolol  5 mg Intravenous Q8H  . potassium chloride  40 mEq Oral Daily  Continuous Infusions: . sodium chloride 0.9 % 1,000 mL with potassium chloride 40 mEq infusion 150 mL/hr at 03/15/16 0935     LOS: 1 day    Time spent: 33 minutes    THOMPSON,DANIEL, MD [facility] Pager [phone]  If 7PM-7AM, please contact night-coverage [email or web address] Password [email or web address] 03/15/2016, 5:35 PM

## 2016-03-16 DIAGNOSIS — N179 Acute kidney failure, unspecified: Secondary | ICD-10-CM

## 2016-03-16 DIAGNOSIS — K859 Acute pancreatitis without necrosis or infection, unspecified: Principal | ICD-10-CM

## 2016-03-16 DIAGNOSIS — C9210 Chronic myeloid leukemia, BCR/ABL-positive, not having achieved remission: Secondary | ICD-10-CM

## 2016-03-16 LAB — COMPREHENSIVE METABOLIC PANEL
ALBUMIN: 3.1 g/dL — AB (ref 3.5–5.0)
ALK PHOS: 80 U/L (ref 38–126)
ALT: 20 U/L (ref 17–63)
AST: 25 U/L (ref 15–41)
Anion gap: 7 (ref 5–15)
BUN: 6 mg/dL (ref 6–20)
CALCIUM: 8.6 mg/dL — AB (ref 8.9–10.3)
CHLORIDE: 109 mmol/L (ref 101–111)
CO2: 25 mmol/L (ref 22–32)
Creatinine, Ser: 1.07 mg/dL (ref 0.61–1.24)
GFR calc non Af Amer: >60 mL/min (ref >60)
GLUCOSE: 91 mg/dL (ref 65–99)
Potassium: 3.9 mmol/L (ref 3.5–5.1)
SODIUM: 141 mmol/L (ref 135–145)
Total Bilirubin: 0.9 mg/dL (ref 0.3–1.2)
Total Protein: 5.2 g/dL — ABNORMAL LOW (ref 6.5–8.1)

## 2016-03-16 LAB — CBC WITH DIFFERENTIAL/PLATELET
BASOS PCT: 0 %
Basophils Absolute: 0 10*3/uL (ref 0.0–0.1)
Eosinophils Absolute: 0.4 10*3/uL (ref 0.0–0.7)
Eosinophils Relative: 8 %
HEMATOCRIT: 38.4 % — AB (ref 39.0–52.0)
HEMOGLOBIN: 12.4 g/dL — AB (ref 13.0–17.0)
LYMPHS ABS: 1.4 10*3/uL (ref 0.7–4.0)
Lymphocytes Relative: 25 %
MCH: 26.1 pg (ref 26.0–34.0)
MCHC: 32.3 g/dL (ref 30.0–36.0)
MCV: 80.7 fL (ref 78.0–100.0)
MONO ABS: 0.5 10*3/uL (ref 0.1–1.0)
MONOS PCT: 9 %
NEUTROS ABS: 3.1 10*3/uL (ref 1.7–7.7)
NEUTROS PCT: 58 %
Platelets: 139 10*3/uL — ABNORMAL LOW (ref 150–400)
RBC: 4.76 MIL/uL (ref 4.22–5.81)
RDW: 17.2 % — AB (ref 11.5–15.5)
WBC: 5.5 10*3/uL (ref 4.0–10.5)

## 2016-03-16 LAB — MAGNESIUM: Magnesium: 1.9 mg/dL (ref 1.7–2.4)

## 2016-03-16 NOTE — Progress Notes (Signed)
[name] to be D/C'd Home per MD order.  Discussed prescriptions and follow up appointments with the patient. Prescriptions given to patient, medication list explained in detail. Pt verbalized understanding.    Medication List    TAKE these medications   albuterol 108 (90 Base) MCG/ACT inhaler Commonly known as:  PROVENTIL HFA;VENTOLIN HFA Inhale 2 puffs into the lungs every 6 (six) hours as needed for wheezing or shortness of breath.   amLODipine 10 MG tablet Commonly known as:  NORVASC Take 1 tablet (10 mg total) by mouth daily.   colchicine 0.6 MG tablet Take 0.6 mg by mouth daily. For gout   diphenoxylate-atropine 2.5-0.025 MG tablet Commonly known as:  LOMOTIL TAKE 1 TABLET 4 TIMES A DAY AS NEEDED FOR DIARRHEA   imatinib 100 MG tablet Commonly known as:  GLEEVEC Take 2 tablets (200 mg total) by mouth daily. Take with meals and large glass of water.Caution:Chemotherapy   magnesium oxide 400 (241.3 Mg) MG tablet Commonly known as:  MAG-OX Take 1 tablet (400 mg total) by mouth 2 (two) times daily.   metoprolol succinate 50 MG 24 hr tablet Commonly known as:  TOPROL-XL Take 50 mg by mouth daily.   Pancrelipase (Lip-Prot-Amyl) 24000-76000 units Cpep Take 1 capsule (24,000 Units total) by mouth 3 (three) times daily before meals.   pantoprazole 40 MG tablet Commonly known as:  PROTONIX Take 40 mg by mouth daily.   potassium chloride 20 MEQ packet Commonly known as:  KLOR-CON Take 20 mEq by mouth 3 (three) times daily.   prochlorperazine 5 MG tablet Commonly known as:  COMPAZINE Take 1-2 tablets (5-10 mg total) by mouth every 6 (six) hours as needed for nausea.   promethazine 25 MG tablet Commonly known as:  PHENERGAN Take 1 tablet (25 mg total) by mouth every 6 (six) hours as needed for nausea or vomiting.   simvastatin 40 MG tablet Commonly known as:  ZOCOR Take 40 mg by mouth at bedtime.   testosterone cypionate 200 MG/ML injection Commonly known as:   DEPOTESTOSTERONE CYPIONATE Inject 200 mg into the muscle every Tuesday.   traMADol 50 MG tablet Commonly known as:  ULTRAM Take 1 tablet (50 mg total) by mouth every 6 (six) hours as needed for severe pain.   VIIBRYD 40 MG Tabs Generic drug:  Vilazodone HCl Take 40 mg by mouth daily.       Vitals:   03/16/16 0544 03/16/16 1000  BP: (!) 176/106 (!) 175/99  Pulse: 76 67  Resp: 18 18  Temp: 98.5 F (36.9 C) 98.2 F (36.8 C)    Skin clean, dry and intact without evidence of skin break down, no evidence of skin tears noted. IV catheter discontinued intact. Site without signs and symptoms of complications. Dressing and pressure applied. Pt denies pain at this time. No complaints noted.  An After Visit Summary was printed and given to the patient. Patient escorted via [place], and D/C home via private auto. Pt belongings gathered taken home with pt. Pt informed of BP and pt refused Lopressor injection 5 mg. Pt stated he would take Lopressor medication at home today.    [name], RN [facility] Phone [phone]

## 2016-03-16 NOTE — Discharge Summary (Signed)
Physician Discharge Summary  [name] [name]:[id number] DOB: 1945/06/15 DOA: 03/14/2016  PCP: Philis Fendt, MD  Admit date: 03/14/2016 Discharge date: 03/16/2016  Recommendations for Outpatient Follow-up:  1. Check CBC and BMP during office visit with PCP 2. No changes in medications on discharge   Discharge Diagnoses:  Principal Problem:   Pancreatitis Active Problems:   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HCC)   Essential hypertension   Tobacco abuse   Poor dentition   Acute kidney injury (HCC)   Abdominal pain   Hypokalemia   Diarrhea, unspecified    Discharge Condition: stable; wants to go home today   Diet recommendation: as tolerated   History of present illness:  70 year old [name] with past medical history significant for CML who presented to [facility] with acute pancreatitis.  Hospital Course:  Acute on chronic pancreatitis - Unclear etiology - Abdominal ultrasound consistent with fatty infiltration of the liver with small right renal cyst no evidence of cholelithiasis or cholecystitis.  - LFTs within normal limits.  - Tolerated regular diet  Hypokalemia - Secondary to acute pancreatitis, supplemented and potassium within normal limits  Acute kidney injury - Likely secondary to prerenal azotemia. Renal function improved with hydration.   Tobacco abuse - Tobacco cessation counseling provided   Chronic diarrhea - Stable. Secondary to chemotherapy medications ( [place]). Patient not on any antibiotics recently. No change in stool consistency or color. Lomotil as needed.  CML - In remission - On Gleevec    DVT prophylaxis: Lovenox Code Status: Full Family Communication:  family not at the bedside this morning    Consultants:   None  Procedures:   Abdominal ultrasound 03/14/2016  Antimicrobials:   None   Signed:  Leisa Lenz, MD  [facility] 03/16/2016, 11:25 AM  Pager #: [phone]  Time spent in minutes: less than 30  minutes    Discharge Exam: Vitals:   03/16/16 0544 03/16/16 1000  BP: (!) 176/106 (!) 175/99  Pulse: 76 67  Resp: 18 18  Temp: 98.5 F (36.9 C) 98.2 F (36.8 C)   Vitals:   03/15/16 1800 03/15/16 2057 03/16/16 0544 03/16/16 1000  BP: (!) 170/88 (!) 176/89 (!) 176/106 (!) 175/99  Pulse: 75 65 76 67  Resp: 18 16 18 18   Temp: 98.5 F (36.9 C) 98.5 F (36.9 C) 98.5 F (36.9 C) 98.2 F (36.8 C)  TempSrc: Oral Oral Oral Oral  SpO2: 95% 95% 96% 99%  Weight:  67.5 kg (148 lb 12.8 oz)    Height:  5\' 8"  (1.727 m)      General: Pt is alert, follows commands appropriately, not in acute distress Cardiovascular: Regular rate and rhythm, S1/S2 + Respiratory: Clear to auscultation bilaterally, no wheezing, no crackles, no rhonchi Abdominal: Soft, non tender, non distended, bowel sounds +, no guarding Extremities: no edema, no cyanosis, pulses palpable bilaterally DP and PT Neuro: Grossly nonfocal  Discharge Instructions  Discharge Instructions    Call MD for:  difficulty breathing, headache or visual disturbances    Complete by:  As directed    Call MD for:  redness, tenderness, or signs of infection (pain, swelling, redness, odor or green/yellow discharge around incision site)    Complete by:  As directed    Call MD for:  severe uncontrolled pain    Complete by:  As directed    Diet - low sodium heart healthy    Complete by:  As directed    Increase activity slowly    Complete by:  As  directed        Medication List    TAKE these medications   albuterol 108 (90 Base) MCG/ACT inhaler Commonly known as:  PROVENTIL HFA;VENTOLIN HFA Inhale 2 puffs into the lungs every 6 (six) hours as needed for wheezing or shortness of breath.   amLODipine 10 MG tablet Commonly known as:  NORVASC Take 1 tablet (10 mg total) by mouth daily.   colchicine 0.6 MG tablet Take 0.6 mg by mouth daily. For gout   diphenoxylate-atropine 2.5-0.025 MG tablet Commonly known as:  LOMOTIL TAKE 1  TABLET 4 TIMES A DAY AS NEEDED FOR DIARRHEA   imatinib 100 MG tablet Commonly known as:  GLEEVEC Take 2 tablets (200 mg total) by mouth daily. Take with meals and large glass of water.Caution:Chemotherapy   magnesium oxide 400 (241.3 Mg) MG tablet Commonly known as:  MAG-OX Take 1 tablet (400 mg total) by mouth 2 (two) times daily.   metoprolol succinate 50 MG 24 hr tablet Commonly known as:  TOPROL-XL Take 50 mg by mouth daily.   Pancrelipase (Lip-Prot-Amyl) 24000-76000 units Cpep Take 1 capsule (24,000 Units total) by mouth 3 (three) times daily before meals.   pantoprazole 40 MG tablet Commonly known as:  PROTONIX Take 40 mg by mouth daily.   potassium chloride 20 MEQ packet Commonly known as:  KLOR-CON Take 20 mEq by mouth 3 (three) times daily.   prochlorperazine 5 MG tablet Commonly known as:  COMPAZINE Take 1-2 tablets (5-10 mg total) by mouth every 6 (six) hours as needed for nausea.   promethazine 25 MG tablet Commonly known as:  PHENERGAN Take 1 tablet (25 mg total) by mouth every 6 (six) hours as needed for nausea or vomiting.   simvastatin 40 MG tablet Commonly known as:  ZOCOR Take 40 mg by mouth at bedtime.   testosterone cypionate 200 MG/ML injection Commonly known as:  DEPOTESTOSTERONE CYPIONATE Inject 200 mg into the muscle every Tuesday.   traMADol 50 MG tablet Commonly known as:  ULTRAM Take 1 tablet (50 mg total) by mouth every 6 (six) hours as needed for severe pain.   VIIBRYD 40 MG Tabs Generic drug:  Vilazodone HCl Take 40 mg by mouth daily.      Follow-up Information    Philis Fendt, MD. Schedule an appointment as soon as possible for a visit in 1 week(s).   Specialty:  Internal Medicine Contact information: [address] [place] [address] [phone]            The results of significant diagnostics from this hospitalization (including imaging, microbiology, ancillary and laboratory) are listed below for reference.     Significant Diagnostic Studies: US Abdomen Complete  Result Date: 03/14/2016 CLINICAL DATA:  Abdominal pain for 3 days. Diarrhea. History of hypertension, leukemia, pancreatitis. EXAM: ABDOMEN ULTRASOUND COMPLETE COMPARISON:  CT abdomen and pelvis 07/05/2014 FINDINGS: Gallbladder: No gallstones or wall thickening visualized. No sonographic Murphy sign noted by sonographer. Common bile duct: Diameter: 4.8 mm, normal Liver: Limited visualization due to bowel gas. Diffusely increased parenchymal echotexture suggesting fatty infiltration. IVC: No abnormality visualized. Pancreas: Not visualized due to overlying bowel gas. Spleen: Size and appearance within normal limits. Right Kidney: Length: 9.7 cm. Small cyst in the lower pole measuring 1.1 cm maximal diameter. No hydronephrosis. Left Kidney: Length: 10.1 cm. Echogenicity within normal limits. No mass or hydronephrosis visualized. Abdominal aorta: Visualized portion demonstrates no evidence of aneurysm. Atherosclerotic calcification. Other findings: None. IMPRESSION: Examination is limited due to bowel gas. Diffuse  fatty infiltration of the liver. Small right renal cyst. No evidence of cholelithiasis or cholecystitis. Electronically Signed   By: Lucienne Capers M.D.   On: 03/14/2016 04:47    Microbiology: No results found for this or any previous visit (from the past 240 hour(s)).   Labs: Basic Metabolic Panel:  Recent Labs Lab 03/14/16 0308 03/14/16 0743 03/15/16 0511 03/16/16 0550  NA 140  --  141 141  K 3.2*  --  3.1* 3.9  CL 101  --  106 109  CO2 30  --  25 25  GLUCOSE 108*  --  69 91  BUN 9  --  9 6  CREATININE 1.48*  --  1.Bruce* 1.07  CALCIUM 9.0  --  8.3* 8.6*  MG  --  1.9 1.8 1.9   Liver Function Tests:  Recent Labs Lab 03/14/16 0308 03/16/16 0550  AST 27 25  ALT 24 20  ALKPHOS 72 80  BILITOT 0.4 0.9  PROT 5.5* 5.2*  ALBUMIN 3.3* 3.1*    Recent Labs Lab 03/14/16 0308  LIPASE 104*   No results for input(s):  AMMONIA in the last 168 hours. CBC:  Recent Labs Lab 03/14/16 0308 03/15/16 0511 03/16/16 0550  WBC 6.8 5.6 5.5  NEUTROABS 4.1  --  3.1  HGB 12.9* 12.0* 12.4*  HCT 39.1 37.3* 38.4*  MCV 79.5 80.7 80.7  PLT 158 134* 139*   Cardiac Enzymes: No results for input(s): CKTOTAL, CKMB, CKMBINDEX, TROPONINI in the last 168 hours. BNP: BNP (last 3 results) No results for input(s): BNP in the last 8760 hours.  ProBNP (last 3 results) No results for input(s): PROBNP in the last 8760 hours.  CBG:  Recent Labs Lab 03/14/16 0822  GLUCAP 105*

## 2016-03-16 NOTE — Discharge Instructions (Signed)

## 2016-04-04 ENCOUNTER — Other Ambulatory Visit (HOSPITAL_BASED_OUTPATIENT_CLINIC_OR_DEPARTMENT_OTHER): Payer: Medicare Other

## 2016-04-04 ENCOUNTER — Other Ambulatory Visit: Payer: Self-pay | Admitting: *Deleted

## 2016-04-04 ENCOUNTER — Ambulatory Visit (HOSPITAL_BASED_OUTPATIENT_CLINIC_OR_DEPARTMENT_OTHER): Payer: Medicare Other | Admitting: Oncology

## 2016-04-04 VITALS — BP 161/92 | HR 65 | Temp 98.2°F | Resp 17 | Ht 68.0 in | Wt 152.5 lb

## 2016-04-04 DIAGNOSIS — C9211 Chronic myeloid leukemia, BCR/ABL-positive, in remission: Secondary | ICD-10-CM

## 2016-04-04 DIAGNOSIS — C9210 Chronic myeloid leukemia, BCR/ABL-positive, not having achieved remission: Secondary | ICD-10-CM

## 2016-04-04 DIAGNOSIS — R197 Diarrhea, unspecified: Secondary | ICD-10-CM

## 2016-04-04 LAB — COMPREHENSIVE METABOLIC PANEL
ALBUMIN: 3.2 g/dL — AB (ref 3.5–5.0)
ALT: 27 U/L (ref 0–55)
AST: 27 U/L (ref 5–34)
Alkaline Phosphatase: 114 U/L (ref 40–150)
Anion Gap: 11 mEq/L (ref 3–11)
BUN: 14.6 mg/dL (ref 7.0–26.0)
CHLORIDE: 107 meq/L (ref 98–109)
CO2: 25 meq/L (ref 22–29)
Calcium: 8.9 mg/dL (ref 8.4–10.4)
Creatinine: 1.4 mg/dL — ABNORMAL HIGH (ref 0.7–1.3)
EGFR: 57 mL/min/{1.73_m2} — ABNORMAL LOW (ref >90)
GLUCOSE: 143 mg/dL — AB (ref 70–140)
POTASSIUM: 2.9 meq/L — AB (ref 3.5–5.1)
SODIUM: 143 meq/L (ref 136–145)
Total Bilirubin: 0.33 mg/dL (ref 0.20–1.20)
Total Protein: 6 g/dL — ABNORMAL LOW (ref 6.4–8.3)

## 2016-04-04 LAB — CBC WITH DIFFERENTIAL/PLATELET
BASO%: 0.5 % (ref 0.0–2.0)
BASOS ABS: 0 10*3/uL (ref 0.0–0.1)
EOS%: 7.9 % — AB (ref 0.0–7.0)
Eosinophils Absolute: 0.5 10*3/uL (ref 0.0–0.5)
HCT: 39.2 % (ref 38.4–49.9)
HEMOGLOBIN: 12.9 g/dL — AB (ref 13.0–17.1)
LYMPH%: 23.3 % (ref 14.0–49.0)
MCH: 26.2 pg — AB (ref 27.2–33.4)
MCHC: 32.9 g/dL (ref 32.0–36.0)
MCV: 79.5 fL (ref 79.3–98.0)
MONO#: 0.4 10*3/uL (ref 0.1–0.9)
MONO%: 6.3 % (ref 0.0–14.0)
NEUT#: 3.8 10*3/uL (ref 1.5–6.5)
NEUT%: 62 % (ref 39.0–75.0)
Platelets: 179 10*3/uL (ref 140–400)
RBC: 4.93 10*6/uL (ref 4.20–5.82)
RDW: 17.2 % — AB (ref 11.0–14.6)
WBC: 6.2 10*3/uL (ref 4.0–10.3)
lymph#: 1.4 10*3/uL (ref 0.9–3.3)

## 2016-04-04 LAB — MAGNESIUM: Magnesium: 1.7 mg/dl (ref 1.5–2.5)

## 2016-04-04 MED ORDER — PROMETHAZINE HCL 12.5 MG PO TABS: 12.5000 mg | ORAL_TABLET | Freq: Four times a day (QID) | ORAL | 1 refills | Status: DC | PRN

## 2016-04-04 MED ORDER — DIPHENOXYLATE-ATROPINE 2.5-0.025 MG PO TABS: 1.0000 | ORAL_TABLET | Freq: Four times a day (QID) | ORAL | 0 refills | Status: DC | PRN

## 2016-04-04 MED ORDER — POTASSIUM CHLORIDE 20 MEQ PO PACK: 20.0000 meq | PACK | Freq: Three times a day (TID) | ORAL | 2 refills | Status: DC

## 2016-04-04 NOTE — Progress Notes (Signed)
[facility] OFFICE PROGRESS NOTE   Diagnosis: CML  INTERVAL HISTORY:   [name] returns as scheduled. He was admitted 03/14/2016 with pancreatitis. He no longer has nausea or abdominal pain. He continues to have intermittent diarrhea. He is not taking the pancreatic enzymes or Lomotil. He is currently not taking potassium. He takes Gleevec daily. He complains of pain in the hands and wrists for several months. He has numbness at several fingers. No arm weakness.  Objective:  Vital signs in last 24 hours:  Blood pressure (!) 161/92, pulse 65, temperature 98.2 F (36.8 C), temperature source Oral, resp. rate 17, height 5\' 8"  (1.727 m), weight 152 lb 8 oz (69.2 kg), SpO2 100 %.    HEENT: No thrush or ulcers Resp: Lungs with inspiratory rhonchi at the posterior bases bilaterally, no respiratory distress Cardio: Regular rate and rhythm GI: Nontender, no hepatosplenomegaly Vascular: No left leg edema Neuro: Normal strength at the arms bilaterally Musculoskeletal: Decreased range of motion at the right greater than left wrist. Flexion deformity at several fingers of the right hand. No joint swelling.    Lab Results:  Lab Results  Component Value Date   WBC 6.2 04/04/2016   HGB 12.9 (L) 04/04/2016   HCT 39.2 04/04/2016   MCV 79.5 04/04/2016   PLT 179 04/04/2016   NEUTROABS 3.8 04/04/2016  Potassium 2.9, BUN 14.6, creatinine 1.4, magnesium 1.7   Medications: I have reviewed the patient's current medications.  Assessment/Plan: 1. Chronic myelogenous leukemia, diagnosed in January of 2009. He remains in hematologic remission. He is taking Gleevec at a dose of 200 mg daily. The peripheral blood PCR was markedly increased in May 2013, likely reflecting medical noncompliance. The peripheral blood PCR was significantly improved on 11/07/2011. The peripheral blood PCR was further improved on 01/31/2012. The peripheral blood PCR was increased on 04/27/2012; stable on  11/12/2012, improved on 02/12/2013; increased on 04/29/2013; increased on 07/10/2013; improved on 09/16/2013; improved on 10/28/2013; increased 02/17/2014; slightly improved 03/28/2014. Stable 04/29/2014. Increased 05/30/2014. Improved 07/26/2014. Increased 07/14/2015. Improved August 2017, stable October 2017. 2. 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 3. Status post C3-C4, C4-C5, anterior cervical diskectomy and fusion with allograft and plating 09/30/2010. 4. 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 5. Depression 6. Diarrhea, ? related to chronic pancreatitis versus Gleevec. He takes Lomotil as needed.  7. Indurated facial skin lesion 11/20/2007 with a history of MRSA skin infection of the submental area in May 2008. The induration resolved with doxycycline. 8. Left knee arthroscopy 05/25/2007. 9. Postoperative left knee effusion/pain, likely related to gout. 10. History of gout.  11. Chronic pancreatitis. 12. Status post right above-the-knee amputation. 13. MRSA infection of the submental area May 2008. 14. History of tobacco, alcohol, and cocaine use. 15. History of coronary artery disease. 16. "Shotty" lymphadenopathy of the neck, axilla, and left groin in 2009.  17. History of a microcytic anemia.  18. History of a right olecranon bursa lesion, ? gouty tophus. 19. 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 He previously took AndroGel. 20. History of anemia secondary to chronic disease and Gleevec therapy.  21. history ofAnorexia-potentially related to [place]. He reports Medicaid would not pay for Megace.  22. Chronic left knee and left foot pain.  23. Status post removal of a left knee screw 07/03/2012. 24. History of Hypokalemia. Likely related to diarrhea. 25. Status post left foot surgery. 26. Pulsatile fullness right neck. Evaluated by vascular surgery status  post CT angiogram 03/07/2014 with no carotid  artery aneurysm identified. 27. Cough-abnormal lung exam 05/12/2015. He completed a course of Levaquin. Chest xray negative.     Disposition:  [name] is stable from a hematologic standpoint. He will continue Gleevec. Hypokalemia is likely related to diarrhea. He will resume the potassium supplement. I recommended he begin Lomotil and pancreatic enzyme replacement.  We will refer him to orthopedics to evaluate the risks/hand symptoms.  [name] will return for an office and lab visit in 6 weeks. He will return next week for a repeat chemistry panel.  [name], MD  04/04/2016  3:54 PM

## 2016-04-04 NOTE — Addendum Note (Signed)
Addended by: Brien Few on: 04/04/2016 05:04 PM   Modules accepted: Orders

## 2016-04-15 ENCOUNTER — Other Ambulatory Visit: Payer: Self-pay

## 2016-04-18 ENCOUNTER — Telehealth: Payer: Self-pay | Admitting: Nurse Practitioner

## 2016-04-18 NOTE — Telephone Encounter (Signed)
Lab appointment rescheduled per patient request. Patient missed appointment due to inclement weather.

## 2016-04-19 ENCOUNTER — Other Ambulatory Visit (HOSPITAL_BASED_OUTPATIENT_CLINIC_OR_DEPARTMENT_OTHER): Payer: Medicare Other

## 2016-04-19 DIAGNOSIS — C9211 Chronic myeloid leukemia, BCR/ABL-positive, in remission: Secondary | ICD-10-CM

## 2016-04-19 DIAGNOSIS — C9210 Chronic myeloid leukemia, BCR/ABL-positive, not having achieved remission: Secondary | ICD-10-CM

## 2016-04-19 LAB — CBC WITH DIFFERENTIAL/PLATELET
BASO%: 0.3 % (ref 0.0–2.0)
BASOS ABS: 0 10*3/uL (ref 0.0–0.1)
EOS ABS: 0.4 10*3/uL (ref 0.0–0.5)
EOS%: 5.6 % (ref 0.0–7.0)
HEMATOCRIT: 37.7 % — AB (ref 38.4–49.9)
HEMOGLOBIN: 12.6 g/dL — AB (ref 13.0–17.1)
LYMPH#: 1.7 10*3/uL (ref 0.9–3.3)
LYMPH%: 26.7 % (ref 14.0–49.0)
MCH: 26.4 pg — AB (ref 27.2–33.4)
MCHC: 33.4 g/dL (ref 32.0–36.0)
MCV: 78.9 fL — AB (ref 79.3–98.0)
MONO#: 0.6 10*3/uL (ref 0.1–0.9)
MONO%: 8.6 % (ref 0.0–14.0)
NEUT#: 3.8 10*3/uL (ref 1.5–6.5)
NEUT%: 58.8 % (ref 39.0–75.0)
PLATELETS: 163 10*3/uL (ref 140–400)
RBC: 4.78 10*6/uL (ref 4.20–5.82)
RDW: 17.7 % — AB (ref 11.0–14.6)
WBC: 6.4 10*3/uL (ref 4.0–10.3)

## 2016-04-19 LAB — BASIC METABOLIC PANEL
Anion Gap: 11 mEq/L (ref 3–11)
BUN: 16.8 mg/dL (ref 7.0–26.0)
CHLORIDE: 105 meq/L (ref 98–109)
CO2: 29 meq/L (ref 22–29)
Calcium: 9 mg/dL (ref 8.4–10.4)
Creatinine: 1.4 mg/dL — ABNORMAL HIGH (ref 0.7–1.3)
EGFR: 59 mL/min/{1.73_m2} — AB (ref >90)
Glucose: 93 mg/dl (ref 70–140)
Potassium: 3.1 mEq/L — ABNORMAL LOW (ref 3.5–5.1)
SODIUM: 144 meq/L (ref 136–145)

## 2016-04-19 LAB — MAGNESIUM: Magnesium: 1.7 mg/dl (ref 1.5–2.5)

## 2016-04-20 ENCOUNTER — Telehealth: Payer: Self-pay

## 2016-04-20 NOTE — Telephone Encounter (Signed)
Called pt about potassium, pt states he is not taking it, keeps forgetting to pick up his refill from the pharmacy. Informed pt his potassium was low and needed to pick up his prescription and start taking it again. Pt verbalized understanding and denies any further questions or concerns at this time

## 2016-04-20 NOTE — Telephone Encounter (Signed)
-----   Message from Ladell Pier, MD sent at 04/19/2016  9:12 PM EST ----- Please call patient, be sure he is taking the potassium, f/u lab and office 1/8 as scheduled

## 2016-04-29 ENCOUNTER — Other Ambulatory Visit (HOSPITAL_COMMUNITY)
Admission: RE | Admit: 2016-04-29 | Discharge: 2016-04-29 | Disposition: A | Discharge status: Home / Self Care | Payer: Medicare Other | Source: Other Acute Inpatient Hospital | Attending: *Deleted | Admitting: *Deleted

## 2016-04-29 ENCOUNTER — Inpatient Hospital Stay (HOSPITAL_COMMUNITY)
Admission: AD | Admit: 2016-04-29 | Discharge: 2016-04-30 | DRG: 392 | Disposition: A | Discharge status: Home / Self Care | Payer: Medicare Other | Source: Ambulatory Visit | Attending: Internal Medicine | Admitting: Internal Medicine

## 2016-04-29 ENCOUNTER — Ambulatory Visit (HOSPITAL_BASED_OUTPATIENT_CLINIC_OR_DEPARTMENT_OTHER): Payer: Medicare Other

## 2016-04-29 ENCOUNTER — Other Ambulatory Visit: Payer: Self-pay | Admitting: Nurse Practitioner

## 2016-04-29 ENCOUNTER — Inpatient Hospital Stay (HOSPITAL_COMMUNITY): Payer: Medicare Other

## 2016-04-29 ENCOUNTER — Ambulatory Visit (HOSPITAL_BASED_OUTPATIENT_CLINIC_OR_DEPARTMENT_OTHER): Payer: Medicare Other | Admitting: Nurse Practitioner

## 2016-04-29 ENCOUNTER — Telehealth: Payer: Self-pay | Admitting: *Deleted

## 2016-04-29 VITALS — BP 159/99 | HR 83 | Temp 98.8°F | Resp 16 | Ht 68.0 in | Wt 149.0 lb

## 2016-04-29 DIAGNOSIS — Z79899 Other long term (current) drug therapy: Secondary | ICD-10-CM

## 2016-04-29 DIAGNOSIS — I2510 Atherosclerotic heart disease of native coronary artery without angina pectoris: Secondary | ICD-10-CM | POA: Diagnosis present

## 2016-04-29 DIAGNOSIS — C9210 Chronic myeloid leukemia, BCR/ABL-positive, not having achieved remission: Secondary | ICD-10-CM

## 2016-04-29 DIAGNOSIS — C9211 Chronic myeloid leukemia, BCR/ABL-positive, in remission: Secondary | ICD-10-CM

## 2016-04-29 DIAGNOSIS — E876 Hypokalemia: Secondary | ICD-10-CM | POA: Diagnosis not present

## 2016-04-29 DIAGNOSIS — I252 Old myocardial infarction: Secondary | ICD-10-CM

## 2016-04-29 DIAGNOSIS — Z96652 Presence of left artificial knee joint: Secondary | ICD-10-CM | POA: Diagnosis not present

## 2016-04-29 DIAGNOSIS — Z8249 Family history of ischemic heart disease and other diseases of the circulatory system: Secondary | ICD-10-CM

## 2016-04-29 DIAGNOSIS — K529 Noninfective gastroenteritis and colitis, unspecified: Secondary | ICD-10-CM | POA: Diagnosis not present

## 2016-04-29 DIAGNOSIS — F329 Major depressive disorder, single episode, unspecified: Secondary | ICD-10-CM | POA: Diagnosis present

## 2016-04-29 DIAGNOSIS — R112 Nausea with vomiting, unspecified: Secondary | ICD-10-CM

## 2016-04-29 DIAGNOSIS — Z89611 Acquired absence of right leg above knee: Secondary | ICD-10-CM | POA: Diagnosis not present

## 2016-04-29 DIAGNOSIS — I10 Essential (primary) hypertension: Secondary | ICD-10-CM | POA: Diagnosis not present

## 2016-04-29 DIAGNOSIS — F17210 Nicotine dependence, cigarettes, uncomplicated: Secondary | ICD-10-CM | POA: Diagnosis not present

## 2016-04-29 DIAGNOSIS — E860 Dehydration: Secondary | ICD-10-CM | POA: Diagnosis present

## 2016-04-29 DIAGNOSIS — R634 Abnormal weight loss: Secondary | ICD-10-CM | POA: Diagnosis not present

## 2016-04-29 DIAGNOSIS — R197 Diarrhea, unspecified: Secondary | ICD-10-CM | POA: Diagnosis present

## 2016-04-29 DIAGNOSIS — Z825 Family history of asthma and other chronic lower respiratory diseases: Secondary | ICD-10-CM

## 2016-04-29 DIAGNOSIS — M109 Gout, unspecified: Secondary | ICD-10-CM

## 2016-04-29 DIAGNOSIS — Y900 Blood alcohol level of less than 20 mg/100 ml: Secondary | ICD-10-CM | POA: Diagnosis not present

## 2016-04-29 DIAGNOSIS — F1094 Alcohol use, unspecified with alcohol-induced mood disorder: Secondary | ICD-10-CM | POA: Diagnosis present

## 2016-04-29 DIAGNOSIS — Z9861 Coronary angioplasty status: Secondary | ICD-10-CM

## 2016-04-29 DIAGNOSIS — Z720 Tobacco use: Secondary | ICD-10-CM | POA: Diagnosis present

## 2016-04-29 DIAGNOSIS — M24662 Ankylosis, left knee: Secondary | ICD-10-CM

## 2016-04-29 LAB — CBC WITH DIFFERENTIAL/PLATELET
BASO%: 0.2 % (ref 0.0–2.0)
Basophils Absolute: 0 10*3/uL (ref 0.0–0.1)
EOS ABS: 0.4 10*3/uL (ref 0.0–0.5)
EOS%: 7 % (ref 0.0–7.0)
HEMATOCRIT: 37.8 % — AB (ref 38.4–49.9)
HEMOGLOBIN: 12.4 g/dL — AB (ref 13.0–17.1)
LYMPH#: 1.2 10*3/uL (ref 0.9–3.3)
LYMPH%: 23.4 % (ref 14.0–49.0)
MCH: 25.9 pg — ABNORMAL LOW (ref 27.2–33.4)
MCHC: 32.8 g/dL (ref 32.0–36.0)
MCV: 79.1 fL — AB (ref 79.3–98.0)
MONO#: 0.5 10*3/uL (ref 0.1–0.9)
MONO%: 8.7 % (ref 0.0–14.0)
NEUT%: 60.7 % (ref 39.0–75.0)
NEUTROS ABS: 3.1 10*3/uL (ref 1.5–6.5)
Platelets: 211 10*3/uL (ref 140–400)
RBC: 4.78 10*6/uL (ref 4.20–5.82)
RDW: 17.6 % — AB (ref 11.0–14.6)
WBC: 5.2 10*3/uL (ref 4.0–10.3)

## 2016-04-29 LAB — COMPREHENSIVE METABOLIC PANEL
ALBUMIN: 3.1 g/dL — AB (ref 3.5–5.0)
ALK PHOS: 126 U/L (ref 40–150)
ALT: 26 U/L (ref 0–55)
AST: 33 U/L (ref 5–34)
Anion Gap: 10 mEq/L (ref 3–11)
BILIRUBIN TOTAL: 0.36 mg/dL (ref 0.20–1.20)
BUN: 22.7 mg/dL (ref 7.0–26.0)
CALCIUM: 9.4 mg/dL (ref 8.4–10.4)
CO2: 28 mEq/L (ref 22–29)
Chloride: 102 mEq/L (ref 98–109)
Creatinine: 1.3 mg/dL (ref 0.7–1.3)
EGFR: 63 mL/min/{1.73_m2} — ABNORMAL LOW (ref >90)
Glucose: 105 mg/dl (ref 70–140)
Potassium: 2.8 mEq/L — CL (ref 3.5–5.1)
SODIUM: 140 meq/L (ref 136–145)
TOTAL PROTEIN: 6.5 g/dL (ref 6.4–8.3)

## 2016-04-29 LAB — LIPASE, BLOOD: Lipase: 20 U/L (ref 11–51)

## 2016-04-29 LAB — MAGNESIUM: MAGNESIUM: 1.7 mg/dL (ref 1.7–2.4)

## 2016-04-29 LAB — URINALYSIS, ROUTINE W REFLEX MICROSCOPIC
BACTERIA UA: NONE SEEN
BILIRUBIN URINE: NEGATIVE
Glucose, UA: NEGATIVE mg/dL
Ketones, ur: NEGATIVE mg/dL
LEUKOCYTES UA: NEGATIVE
Nitrite: NEGATIVE
PH: 6 (ref 5.0–8.0)
Protein, ur: 30 mg/dL — AB
SPECIFIC GRAVITY, URINE: 1.002 — AB (ref 1.005–1.030)
SQUAMOUS EPITHELIAL / LPF: NONE SEEN

## 2016-04-29 LAB — TSH: TSH: 0.439 u[IU]/mL (ref 0.350–4.500)

## 2016-04-29 LAB — PHOSPHORUS: PHOSPHORUS: 3.1 mg/dL (ref 2.5–4.6)

## 2016-04-29 LAB — AMYLASE: Amylase: 92 U/L (ref 28–100)

## 2016-04-29 LAB — ETHANOL: Alcohol, Ethyl (B): <5 mg/dL (ref <5)

## 2016-04-29 LAB — URIC ACID: Uric Acid, Serum: 10.3 mg/dL — ABNORMAL HIGH (ref 4.4–7.6)

## 2016-04-29 MED ORDER — ENOXAPARIN SODIUM 40 MG/0.4ML ~~LOC~~ SOLN
40.0000 mg | SUBCUTANEOUS | Status: DC
  Administered 2016-04-29: 40 mg via SUBCUTANEOUS
  Filled 2016-04-29: qty 0.4

## 2016-04-29 MED ORDER — ACETAMINOPHEN 650 MG RE SUPP: 650.0000 mg | Freq: Four times a day (QID) | RECTAL | Status: DC | PRN

## 2016-04-29 MED ORDER — SODIUM CHLORIDE 0.9 % IV SOLN: 8.0000 mg | Freq: Once | INTRAVENOUS | Status: DC

## 2016-04-29 MED ORDER — TRAMADOL HCL 50 MG PO TABS
50.0000 mg | ORAL_TABLET | Freq: Four times a day (QID) | ORAL | Status: DC | PRN
  Administered 2016-04-30: 50 mg via ORAL
  Filled 2016-04-29: qty 1

## 2016-04-29 MED ORDER — SORBITOL 70 % SOLN: 30.0000 mL | Freq: Every day | Status: DC | PRN

## 2016-04-29 MED ORDER — SODIUM CHLORIDE 0.9 % IV SOLN: INTRAVENOUS | Status: DC

## 2016-04-29 MED ORDER — MAGNESIUM SULFATE 4 GM/100ML IV SOLN
4.0000 g | Freq: Once | INTRAVENOUS | Status: AC
  Administered 2016-04-29: 4 g via INTRAVENOUS
  Filled 2016-04-29: qty 100

## 2016-04-29 MED ORDER — SODIUM CHLORIDE 0.9 % IV SOLN
INTRAVENOUS | Status: DC
  Filled 2016-04-29 (×2): qty 1000

## 2016-04-29 MED ORDER — POTASSIUM CHLORIDE IN NACL 40-0.9 MEQ/L-% IV SOLN
INTRAVENOUS | Status: DC
  Administered 2016-04-29 – 2016-04-30 (×2): 125 mL/h via INTRAVENOUS
  Filled 2016-04-29 (×2): qty 1000

## 2016-04-29 MED ORDER — METHYLPREDNISOLONE SODIUM SUCC 125 MG IJ SOLR
60.0000 mg | INTRAMUSCULAR | Status: DC
  Administered 2016-04-29: 60 mg via INTRAVENOUS
  Filled 2016-04-29: qty 2

## 2016-04-29 MED ORDER — SODIUM CHLORIDE 0.9 % IV SOLN
1000.0000 mL | Freq: Once | INTRAVENOUS | Status: AC
  Administered 2016-04-29: 1000 mL via INTRAVENOUS

## 2016-04-29 MED ORDER — TESTOSTERONE CYPIONATE 200 MG/ML IM SOLN: 200.0000 mg | INTRAMUSCULAR | Status: DC

## 2016-04-29 MED ORDER — PANTOPRAZOLE SODIUM 40 MG PO TBEC: 40.0000 mg | DELAYED_RELEASE_TABLET | Freq: Every day | ORAL | Status: DC

## 2016-04-29 MED ORDER — POTASSIUM CHLORIDE CRYS ER 20 MEQ PO TBCR
40.0000 meq | EXTENDED_RELEASE_TABLET | ORAL | Status: AC
  Administered 2016-04-29 – 2016-04-30 (×3): 40 meq via ORAL
  Filled 2016-04-29 (×3): qty 2

## 2016-04-29 MED ORDER — ONDANSETRON HCL 4 MG/2ML IJ SOLN: 4.0000 mg | Freq: Four times a day (QID) | INTRAMUSCULAR | Status: DC | PRN

## 2016-04-29 MED ORDER — OXYCODONE HCL 5 MG PO TABS
5.0000 mg | ORAL_TABLET | ORAL | Status: DC | PRN
Start: 2016-04-29 — End: 2016-04-30
  Administered 2016-04-29: 5 mg via ORAL
  Filled 2016-04-29 (×2): qty 1

## 2016-04-29 MED ORDER — VITAMIN B-1 100 MG PO TABS
100.0000 mg | ORAL_TABLET | Freq: Every day | ORAL | Status: DC
  Administered 2016-04-29 – 2016-04-30 (×2): 100 mg via ORAL
  Filled 2016-04-29 (×2): qty 1

## 2016-04-29 MED ORDER — ONDANSETRON HCL 4 MG PO TABS
4.0000 mg | ORAL_TABLET | Freq: Four times a day (QID) | ORAL | Status: DC | PRN
Start: 2016-04-29 — End: 2016-04-30

## 2016-04-29 MED ORDER — NICOTINE 21 MG/24HR TD PT24
21.0000 mg | MEDICATED_PATCH | Freq: Every day | TRANSDERMAL | Status: DC
Start: 2016-04-29 — End: 2016-04-30
  Administered 2016-04-29 – 2016-04-30 (×2): 21 mg via TRANSDERMAL
  Filled 2016-04-29 (×2): qty 1

## 2016-04-29 MED ORDER — VILAZODONE HCL 20 MG PO TABS
40.0000 mg | ORAL_TABLET | Freq: Every day | ORAL | Status: DC
  Administered 2016-04-30: 40 mg via ORAL
  Filled 2016-04-29: qty 2

## 2016-04-29 MED ORDER — FLEET ENEMA 7-19 GM/118ML RE ENEM: 1.0000 | ENEMA | Freq: Once | RECTAL | Status: DC | PRN

## 2016-04-29 MED ORDER — THIAMINE HCL 100 MG/ML IJ SOLN: 100.0000 mg | Freq: Every day | INTRAMUSCULAR | Status: DC

## 2016-04-29 MED ORDER — POLYETHYLENE GLYCOL 3350 17 G PO PACK: 17.0000 g | PACK | Freq: Every day | ORAL | Status: DC | PRN

## 2016-04-29 MED ORDER — ACETAMINOPHEN 325 MG PO TABS
650.0000 mg | ORAL_TABLET | Freq: Four times a day (QID) | ORAL | Status: DC | PRN
Start: 2016-04-29 — End: 2016-04-30

## 2016-04-29 MED ORDER — MORPHINE SULFATE (PF) 4 MG/ML IV SOLN
INTRAVENOUS | Status: AC
  Filled 2016-04-29: qty 1

## 2016-04-29 MED ORDER — LORAZEPAM 1 MG PO TABS
1.0000 mg | ORAL_TABLET | Freq: Four times a day (QID) | ORAL | Status: DC | PRN
Start: 2016-04-29 — End: 2016-04-30

## 2016-04-29 MED ORDER — ONDANSETRON HCL 4 MG/2ML IJ SOLN
INTRAMUSCULAR | Status: AC
  Filled 2016-04-29: qty 4

## 2016-04-29 MED ORDER — SENNA 8.6 MG PO TABS
1.0000 | ORAL_TABLET | Freq: Two times a day (BID) | ORAL | Status: DC
  Filled 2016-04-29: qty 1

## 2016-04-29 MED ORDER — KETOROLAC TROMETHAMINE 15 MG/ML IJ SOLN: 15.0000 mg | Freq: Four times a day (QID) | INTRAMUSCULAR | Status: DC | PRN

## 2016-04-29 MED ORDER — SODIUM CHLORIDE 0.9 % IV BOLUS (SEPSIS)
1000.0000 mL | Freq: Once | INTRAVENOUS | Status: AC
  Administered 2016-04-29: 1000 mL via INTRAVENOUS

## 2016-04-29 MED ORDER — SIMVASTATIN 40 MG PO TABS
40.0000 mg | ORAL_TABLET | Freq: Every day | ORAL | Status: DC
  Administered 2016-04-29: 40 mg via ORAL
  Filled 2016-04-29: qty 1

## 2016-04-29 MED ORDER — LORAZEPAM 2 MG/ML IJ SOLN: 1.0000 mg | Freq: Four times a day (QID) | INTRAMUSCULAR | Status: DC | PRN

## 2016-04-29 MED ORDER — MAGNESIUM OXIDE 400 (241.3 MG) MG PO TABS
400.0000 mg | ORAL_TABLET | Freq: Two times a day (BID) | ORAL | Status: DC
  Administered 2016-04-29 – 2016-04-30 (×2): 400 mg via ORAL
  Filled 2016-04-29 (×2): qty 1

## 2016-04-29 MED ORDER — DIPHENOXYLATE-ATROPINE 2.5-0.025 MG PO TABS
1.0000 | ORAL_TABLET | Freq: Four times a day (QID) | ORAL | Status: DC | PRN
  Administered 2016-04-29: 1 via ORAL
  Filled 2016-04-29: qty 1

## 2016-04-29 MED ORDER — ADULT MULTIVITAMIN W/MINERALS CH
1.0000 | ORAL_TABLET | Freq: Every day | ORAL | Status: DC
  Administered 2016-04-30: 1 via ORAL
  Filled 2016-04-29: qty 1

## 2016-04-29 MED ORDER — PANCRELIPASE (LIP-PROT-AMYL) 12000-38000 UNITS PO CPEP
24000.0000 [IU] | ORAL_CAPSULE | Freq: Three times a day (TID) | ORAL | Status: DC
  Administered 2016-04-30 (×2): 24000 [IU] via ORAL
  Filled 2016-04-29 (×2): qty 2

## 2016-04-29 MED ORDER — METOPROLOL SUCCINATE ER 50 MG PO TB24
50.0000 mg | ORAL_TABLET | Freq: Every day | ORAL | Status: DC
  Administered 2016-04-30: 50 mg via ORAL
  Filled 2016-04-29: qty 1

## 2016-04-29 MED ORDER — MORPHINE SULFATE 4 MG/ML IJ SOLN
2.0000 mg | Freq: Once | INTRAMUSCULAR | Status: AC
Start: 2016-04-29 — End: 2016-04-29
  Administered 2016-04-29: 2 mg via INTRAVENOUS
  Filled 2016-04-29: qty 1

## 2016-04-29 MED ORDER — PANTOPRAZOLE SODIUM 40 MG IV SOLR
40.0000 mg | Freq: Two times a day (BID) | INTRAVENOUS | Status: DC
  Administered 2016-04-29 – 2016-04-30 (×2): 40 mg via INTRAVENOUS
  Filled 2016-04-29 (×2): qty 40

## 2016-04-29 MED ORDER — ONDANSETRON HCL 4 MG/2ML IJ SOLN
8.0000 mg | Freq: Once | INTRAMUSCULAR | Status: AC
  Administered 2016-04-29: 8 mg via INTRAVENOUS

## 2016-04-29 MED ORDER — ATORVASTATIN CALCIUM 10 MG PO TABS: 20.0000 mg | ORAL_TABLET | Freq: Every day | ORAL | Status: DC

## 2016-04-29 MED ORDER — AMLODIPINE BESYLATE 10 MG PO TABS
10.0000 mg | ORAL_TABLET | Freq: Every day | ORAL | Status: DC
  Administered 2016-04-30: 10 mg via ORAL
  Filled 2016-04-29: qty 1

## 2016-04-29 MED ORDER — FOLIC ACID 1 MG PO TABS
1.0000 mg | ORAL_TABLET | Freq: Every day | ORAL | Status: DC
  Administered 2016-04-29 – 2016-04-30 (×2): 1 mg via ORAL
  Filled 2016-04-29 (×2): qty 1

## 2016-04-29 NOTE — Progress Notes (Signed)
The order for simvastatin(Zocor) was changed to an equivalent dose of atorvastatin(Lipitor) due to the potential drug interaction with Amlodipine.  When taken in combination with medications that inhibit its metabolism, simvastatin can accumulate which increases the risk of liver toxicity, myopathy, or rhabdomyolysis.  Simvastatin dose should not exceed 10mg /day in patients taking verapamil, diltiazem, fibrates, or niacin >or= 1g/day.   Simvastatin dose should not exceed 20mg /day in patients taking amlodipine, ranolazine or amiodarone.   Please consider this potential interaction at discharge.  [name] A 04/29/2016 10:22 PM

## 2016-04-29 NOTE — Progress Notes (Unsigned)
Pt transported via wheelchair from infusion room to [facility] inpatient room 1331
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
## 2016-04-29 NOTE — Assessment & Plan Note (Signed)
Patient continues to take Gleevac oral chemotherapy as directed for treatment of his leukemia.  He has actually been on the oral chemotherapy for greater than 6 years.  He takes this oral medication on a daily basis.  He suffers with intermittent, chronic, intractable nausea and vomiting.  He states that he has been vomiting several times per day for the past week.  He has nausea medication; but states that it is ineffective.  He feels extremely dehydrated and weak./fatigued as well.  He states that he lives alone.  He has not been taking either his potassium tablets, or his blood pressure medication due to the nausea and vomiting.  Patient received IV fluid rehydration while at the cancer Center today.  He will be corrected admitted via hospitalist, Dr. Grandville Silos for  further evaluation and management.  Brief history and report were called to Dr. Grandville Silos; and Dr. Grandville Silos came down to the [place] to see the patient.  The patient was then direct admitted to the floor for further evaluation and management.  CODE STATUS: Patient should be considered a full code; since there are no advanced directives on the patient's chart.

## 2016-04-29 NOTE — Assessment & Plan Note (Signed)
Patient also states that he has developed diarrhea within the past few days as well.  He feels weak and dehydrated today.  See further notes for details of today's visit.  Patient will be direct admitted to the floor for further evaluation and management.

## 2016-04-29 NOTE — Assessment & Plan Note (Signed)
Patient states that he has been prescribed potassium to take 3 times per day; but has not taken the potassium tablets in approximately one month.  He states that he had an issue with taken of the medication at his pharmacy; so he decided not to take them any longer.  Patient's potassium was down to 2.8 today..  Patient will be direct admitted for further evaluation and management.

## 2016-04-29 NOTE — Telephone Encounter (Signed)
Call from patient requesting return call for advice.  I eat and then take [place].  Within thirty to forty-five minutes I vomit all the food and meds back up.  I feel bad.    Returned call to learn vomiting daily x 1 week or more with Gleevec.  I do not have an appetite and only eat one meal a day.  I try to eat something quick like vienna sausages and pork and beans.  I throw up the indigested food."  Work in today as soon as possible with Symptom Management.  Asked to "come in at 4:00 pm because I have one leg and need to shower". This nurse instructed to come in ASAP.  [facility] will be closed and other resources will be limited.  I'll place my clothes on and come on in then.  ADVISED TO GO TO THE ED in the event he cannot arrive to [facility] ASAP.

## 2016-04-29 NOTE — Assessment & Plan Note (Signed)
Patient has lost approximately 10 pounds since his last weight check.  He has had very little appetite and poor oral intake.  He feels dehydrated today.  See further notes for details of today's visit.

## 2016-04-29 NOTE — Assessment & Plan Note (Signed)
Patient has been taking oral chemotherapy agent Gleevec for approximately 6 years.  He takes this medication on a daily basis.  He presented to the [facility] today with intractable nausea, vomiting, diarrhea, and dehydration.  He was also found to be hypokalemic.  He states he is not taking either his potassium tablets were his blood pressure medication recently.  Patient has a history of intractable nausea and vomiting.  He also has a history of recurrent pancreatitis as well.  However, patient's lipase and amylase were normal today.  Brief history and report were called to Dr. Grandville Silos, hospitalist, who accepted the patient for further evaluation and management.  Patient was advised that he will have the Gleevac oral chemotherapy agent held while he is in the hospital to allow him to further recover.  Advice patient would review all with Dr. [name] when he returns on Tuesday, 05/03/2016.  Patient stated that he understood that he would have all [place] held while he is hospitalized.  Patient is scheduled to return on 05/16/2016 for labs and a follow-up visit.

## 2016-04-29 NOTE — Assessment & Plan Note (Signed)
Patient's blood pressure was 159/99 on initial check.  Patient states he has not taken his blood pressure medication for the last few days due to the nausea, vomiting.
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
## 2016-04-29 NOTE — Assessment & Plan Note (Signed)
Patient continues to take Gleevac oral chemotherapy as directed for treatment of his leukemia.  He has actually been on the oral chemotherapy for greater than 6 years.  He takes this oral medication on a daily basis.  He suffers with intermittent, chronic, intractable nausea and vomiting.  He states that he has been vomiting several times per day for the past week.  He has nausea medication; but states that it is ineffective.  He feels extremely dehydrated and weak./fatigued as well.  He states that he lives alone.  He has not been taking either his potassium tablets, or his blood pressure medication due to the nausea and vomiting.  Patient received IV fluid rehydration while at the cancer Center today.  He will be corrected admitted via hospitalist, Dr. Grandville Silos for  further evaluation and management.  Brief history and report were called to Dr. Grandville Silos; and Dr. Grandville Silos came down to the [facility] to see the patient.  The patient was then direct admitted to the floor for further evaluation and management.  CODE STATUS: Patient should be considered a full code; since there are no advanced directives on the patient's chart.

## 2016-04-29 NOTE — Progress Notes (Signed)
Moved to Infusion room to receiveIV fluids and await room assignment as he is being admitted. Tranported via w/c  IV to be started once he gets to infusion room.  Report given to [employer]  Received room assignment of 1331. Provided this information to [place], Therapist, sports.

## 2016-04-30 ENCOUNTER — Encounter (HOSPITAL_COMMUNITY): Payer: Self-pay | Admitting: *Deleted

## 2016-04-30 DIAGNOSIS — E860 Dehydration: Secondary | ICD-10-CM | POA: Diagnosis not present

## 2016-04-30 DIAGNOSIS — I10 Essential (primary) hypertension: Secondary | ICD-10-CM | POA: Diagnosis not present

## 2016-04-30 DIAGNOSIS — M109 Gout, unspecified: Secondary | ICD-10-CM | POA: Diagnosis present

## 2016-04-30 DIAGNOSIS — R112 Nausea with vomiting, unspecified: Secondary | ICD-10-CM | POA: Diagnosis not present

## 2016-04-30 DIAGNOSIS — C9210 Chronic myeloid leukemia, BCR/ABL-positive, not having achieved remission: Secondary | ICD-10-CM | POA: Diagnosis not present

## 2016-04-30 LAB — CBC
HEMATOCRIT: 33.1 % — AB (ref 39.0–52.0)
Hemoglobin: 11 g/dL — ABNORMAL LOW (ref 13.0–17.0)
MCH: 26.1 pg (ref 26.0–34.0)
MCHC: 33.2 g/dL (ref 30.0–36.0)
MCV: 78.6 fL (ref 78.0–100.0)
PLATELETS: 206 10*3/uL (ref 150–400)
RBC: 4.21 MIL/uL — ABNORMAL LOW (ref 4.22–5.81)
RDW: 17.7 % — AB (ref 11.5–15.5)
WBC: 4.3 10*3/uL (ref 4.0–10.5)

## 2016-04-30 LAB — COMPREHENSIVE METABOLIC PANEL
ALBUMIN: 2.9 g/dL — AB (ref 3.5–5.0)
ALT: 22 U/L (ref 17–63)
AST: 33 U/L (ref 15–41)
Alkaline Phosphatase: 83 U/L (ref 38–126)
Anion gap: 5 (ref 5–15)
BILIRUBIN TOTAL: 0.6 mg/dL (ref 0.3–1.2)
BUN: 15 mg/dL (ref 6–20)
CHLORIDE: 113 mmol/L — AB (ref 101–111)
CO2: 23 mmol/L (ref 22–32)
CREATININE: 0.94 mg/dL (ref 0.61–1.24)
Calcium: 8.1 mg/dL — ABNORMAL LOW (ref 8.9–10.3)
GFR calc Af Amer: >60 mL/min (ref >60)
GLUCOSE: 138 mg/dL — AB (ref 65–99)
POTASSIUM: 4.9 mmol/L (ref 3.5–5.1)
Sodium: 141 mmol/L (ref 135–145)
TOTAL PROTEIN: 5.5 g/dL — AB (ref 6.5–8.1)

## 2016-04-30 LAB — PROTIME-INR
INR: 1.07
Prothrombin Time: 14 seconds (ref 11.4–15.2)

## 2016-04-30 LAB — GLUCOSE, CAPILLARY: Glucose-Capillary: 118 mg/dL — ABNORMAL HIGH (ref 65–99)

## 2016-04-30 LAB — MAGNESIUM: Magnesium: 2.5 mg/dL — ABNORMAL HIGH (ref 1.7–2.4)

## 2016-04-30 MED ORDER — MAGNESIUM OXIDE 400 (241.3 MG) MG PO TABS: 400.0000 mg | ORAL_TABLET | Freq: Two times a day (BID) | ORAL | 0 refills | Status: DC

## 2016-04-30 MED ORDER — SODIUM CHLORIDE 0.9 % IV SOLN: INTRAVENOUS | Status: DC

## 2016-04-30 MED ORDER — METOPROLOL SUCCINATE ER 50 MG PO TB24: 50.0000 mg | ORAL_TABLET | Freq: Every day | ORAL | 1 refills | Status: DC

## 2016-04-30 MED ORDER — SENNA 8.6 MG PO TABS: 1.0000 | ORAL_TABLET | Freq: Two times a day (BID) | ORAL | 0 refills | Status: DC

## 2016-04-30 MED ORDER — SIMVASTATIN 20 MG PO TABS: 20.0000 mg | ORAL_TABLET | Freq: Every day | ORAL | 1 refills | Status: DC

## 2016-04-30 MED ORDER — FOLIC ACID 1 MG PO TABS: 1.0000 mg | ORAL_TABLET | Freq: Every day | ORAL | Status: DC

## 2016-04-30 MED ORDER — PANTOPRAZOLE SODIUM 40 MG PO TBEC: 40.0000 mg | DELAYED_RELEASE_TABLET | Freq: Every day | ORAL | Status: DC

## 2016-04-30 MED ORDER — ALLOPURINOL 300 MG PO TABS: 300.0000 mg | ORAL_TABLET | Freq: Every day | ORAL | Status: DC

## 2016-04-30 MED ORDER — PREDNISONE 20 MG PO TABS
40.0000 mg | ORAL_TABLET | Freq: Every day | ORAL | Status: DC
  Administered 2016-04-30: 40 mg via ORAL
  Filled 2016-04-30: qty 2

## 2016-04-30 MED ORDER — THIAMINE HCL 100 MG PO TABS: 100.0000 mg | ORAL_TABLET | Freq: Every day | ORAL | Status: DC

## 2016-04-30 MED ORDER — COLCHICINE 0.6 MG PO TABS: 0.6000 mg | ORAL_TABLET | Freq: Every day | ORAL | Status: DC

## 2016-04-30 MED ORDER — SODIUM CHLORIDE 0.9 % IV SOLN
INTRAVENOUS | Status: DC
  Administered 2016-04-30: 10:00:00 via INTRAVENOUS

## 2016-04-30 MED ORDER — PREDNISONE 20 MG PO TABS: 40.0000 mg | ORAL_TABLET | Freq: Every day | ORAL | 0 refills | Status: AC

## 2016-04-30 MED ORDER — HYDROCODONE-ACETAMINOPHEN 10-325 MG PO TABS: 1.0000 | ORAL_TABLET | ORAL | Status: DC | PRN

## 2016-04-30 MED ORDER — IMATINIB MESYLATE 100 MG PO TABS: 200.0000 mg | ORAL_TABLET | Freq: Every day | ORAL | 0 refills | Status: DC

## 2016-04-30 MED ORDER — AMLODIPINE BESYLATE 10 MG PO TABS: 10.0000 mg | ORAL_TABLET | Freq: Every day | ORAL | 0 refills | Status: DC

## 2016-04-30 MED ORDER — NICOTINE 21 MG/24HR TD PT24: 21.0000 mg | MEDICATED_PATCH | Freq: Every day | TRANSDERMAL | 0 refills | Status: DC

## 2016-04-30 MED ORDER — ADULT MULTIVITAMIN W/MINERALS CH: 1.0000 | ORAL_TABLET | Freq: Every day | ORAL | Status: DC

## 2016-04-30 NOTE — Progress Notes (Signed)
Patient discharged to home, all discharge medications and instructions reviewed and questions answered.  Patient to be assisted to vehicle by wheelchair.  

## 2016-04-30 NOTE — Evaluation (Signed)
Physical Therapy Evaluation Patient Details Name: [name] MRN: [id number] DOB: [birth year] Today's Date: 04/30/2016   History of Present Illness  70 yo male admitted with intractable N/V/D, dehydration.  Hx of L TKA, R AKA, gout, MI, ETOH abuse, leukemia  Clinical Impression  On eval, pt was Min guard assist for mobility. He walked ~50 feet in hallway without an assistive. Slightly unsteady but no LOB during session. Pt was able to don prosthesis, shoes, and pants without assistance. Discussed d/c plan-pt states he will return home, possibly later today. He reports he is nearly back to baseline with respect to mobility. He declines HHPT follow up.     Follow Up Recommendations No PT follow up    Equipment Recommendations  None recommended by PT    Recommendations for Other Services       Precautions / Restrictions Precautions Precautions: Fall Precaution Comments: R AKA with prosthesis Restrictions Weight Bearing Restrictions: No      Mobility  Bed Mobility Overal bed mobility: Modified Independent                Transfers Overall transfer level: Modified independent                  Ambulation/Gait Ambulation/Gait assistance: Min guard Ambulation Distance (Feet): 50 Feet Assistive device: None Gait Pattern/deviations: Step-to pattern;Wide base of support     General Gait Details: slow, step to gait pattern. No LOB during session.   Stairs            Wheelchair Mobility    Modified Rankin (Stroke Patients Only)       Balance Overall balance assessment: No apparent balance deficits (not formally assessed)                                           Pertinent Vitals/Pain Pain Assessment: No/denies pain    Home Living Family/patient expects to be discharged to:: Private residence Living Arrangements: Alone   Type of Home: Apartment       Home Layout: One level Home Equipment: Crutches      Prior Function  Level of Independence: Independent with assistive device(s)         Comments: mostly ambulatory without a device when wearing a prosthesis. Pt uses crutches when mobilizing without prosthesis     Hand Dominance        Extremity/Trunk Assessment   Upper Extremity Assessment Upper Extremity Assessment: Overall WFL for tasks assessed    Lower Extremity Assessment Lower Extremity Assessment: RLE deficits/detail RLE Deficits / Details: R AKA    Cervical / Trunk Assessment Cervical / Trunk Assessment: Normal  Communication   Communication: No difficulties  Cognition Arousal/Alertness: Awake/alert Behavior During Therapy: WFL for tasks assessed/performed Overall Cognitive Status: Within Functional Limits for tasks assessed                      General Comments      Exercises     Assessment/Plan    PT Assessment Patent does not need any further PT services (Pt declines HHPT follow up)  PT Problem List            PT Treatment Interventions      PT Goals (Current goals can be found in the Care Plan section)  Acute Rehab PT Goals Patient Stated Goal: home today PT Goal Formulation: All assessment and  education complete, DC therapy    Frequency     Barriers to discharge        Co-evaluation               End of Session   Activity Tolerance: Patient tolerated treatment well Patient left: in chair;with call bell/phone within reach           Time: 0349-1791 PT Time Calculation (min) (ACUTE ONLY): 10 min   Charges:   PT Evaluation $PT Eval Low Complexity: 1 Procedure     PT G Codes:        Weston Anna, MPT Pager: [phone]

## 2016-04-30 NOTE — Evaluation (Signed)
Occupational Therapy Evaluation Patient Details Name: [name] MRN: [id number] DOB: 24-Oct-1945 Today's Date: 04/30/2016    History of Present Illness 70 yo male admitted with intractable N/V/D, dehydration.  Hx of L TKA, R AKA, gout, MI, ETOH abuse, leukemia   Clinical Impression   Pt was admitted for the above.  Completed education this session.  Pt did not have LOB but min guard given for safety.      Follow Up Recommendations  No OT follow up;Supervision - Intermittent    Equipment Recommendations  None recommended by OT    Recommendations for Other Services       Precautions / Restrictions Precautions Precautions: Fall Precaution Comments: R AKA with prosthesis Restrictions Weight Bearing Restrictions: No      Mobility Bed Mobility Overal bed mobility: Modified Independent             General bed mobility comments: OOB  Transfers Overall transfer level: Modified independent                    Balance Overall balance assessment: No apparent balance deficits (not formally assessed)                                          ADL Overall ADL's : Needs assistance/impaired                         Toilet Transfer: Min guard;Ambulation             General ADL Comments: pt was able to don clothing and prosthesis when PT worked with him.  Ambulated to bathroom and closet to retrieve items.  Pt needed min guard for safety.  He said that his prosthesis could be tightened but he felt OK with it as it was.  No LOB.  Pt has been an amputee for 40 years.  He has a reacher to retrieve items, but he can get things without it.  He states that usually at home he doesn't use prosthesis--he just uses crutches.  Talked about energy conservation, especially taking breaks until he gets his energy back due to N/V/D.  He has someone come daily (to clean) and he can call her or a close friend if he needs something.  Discussed carrying cell  phone with him, and calling friend to let them know when he is getting into and out of shower,  if he is alone when he does this, until he feels back to baseline     Vision     Perception     Praxis      Pertinent Vitals/Pain Pain Assessment: No/denies pain     Hand Dominance     Extremity/Trunk Assessment Upper Extremity Assessment Upper Extremity Assessment: Overall WFL for tasks assessed      Cervical / Trunk Assessment Cervical / Trunk Assessment: Normal   Communication Communication Communication: No difficulties   Cognition Arousal/Alertness: Awake/alert Behavior During Therapy: WFL for tasks assessed/performed Overall Cognitive Status: Within Functional Limits for tasks assessed                     General Comments       Exercises       Shoulder Instructions      Home Living Family/patient expects to be discharged to:: Private residence Living Arrangements: Alone   Type of Home:  Apartment       Home Layout: One level     Bathroom Shower/Tub: Producer, television/film/video: Standard     Home Equipment: Crutches;Shower seat          Prior Functioning/Environment Level of Independence: Independent with assistive device(s)        Comments: mostly ambulatory without a device when wearing a prosthesis. Pt uses crutches when mobilizing without prosthesis        OT Problem List:     OT Treatment/Interventions:      OT Goals(Current goals can be found in the care plan section) Acute Rehab OT Goals Patient Stated Goal: home today OT Goal Formulation: All assessment and education complete, DC therapy  OT Frequency:     Barriers to D/C:            Co-evaluation              End of Session    Activity Tolerance: Patient tolerated treatment well Patient left: in chair;with call bell/phone within reach   Time: 1313-1326 OT Time Calculation (min): 13 min Charges:  OT General Charges $OT Visit: 1 Procedure OT  Evaluation $OT Eval Low Complexity: 1 Procedure G-Codes:    Ailed Defibaugh 05/17/16, 1:41 PM Marica Otter, OTR/L [phone] 2016-05-17
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
## 2016-04-30 NOTE — Progress Notes (Signed)
Orders received for swallowing evaluation. Per RN, tolerating a clear liquids diet and ordering a regular tray for this evening. Discussed with MD-no need for swallow eval at this time.   [place], [place] [phone]

## 2016-05-01 LAB — URINE CULTURE: Culture: <10000 — AB

## 2016-05-04 NOTE — Progress Notes (Addendum)
OT evaluation addendum  04/30/16 1300  OT Time Calculation  OT Start Time (ACUTE ONLY) 1313  OT Stop Time (ACUTE ONLY) 1326  OT Time Calculation (min) 13 min  OT G-codes **NOT FOR INPATIENT CLASS**  Functional Assessment Tool Used clinical judgment and observation  Functional Limitation Self care  Self Care Current Status (Z6109) CI  Self Care Goal Status (U0454) CI  Self Care Discharge Status (U9811) CI  OT General Charges  $OT Visit 1 Procedure  OT Evaluation  $OT Eval Low Complexity 1 Procedure  Lesle Chris, OTR/L [phone] 05/04/2016

## 2016-05-06 NOTE — Progress Notes (Signed)
   04/30/16 1210  PT Time Calculation  PT Start Time (ACUTE ONLY) 1128  PT Stop Time (ACUTE ONLY) 1138  PT Time Calculation (min) (ACUTE ONLY) 10 min  PT G-Codes **NOT FOR INPATIENT CLASS**  Functional Assessment Tool Used (clinical judgement)  Functional Limitation Mobility: Walking and moving around  Mobility: Walking and Moving Around Current Status (S9702) CI  Mobility: Walking and Moving Around Goal Status (O3785) CI  Mobility: Walking and Moving Around Discharge Status (Y8502) CI  PT General Charges  $$ ACUTE PT VISIT 1 Procedure  PT Evaluation  $PT Eval Low Complexity 1 Procedure   Weston Anna, MPT [phone]

## 2016-05-16 ENCOUNTER — Ambulatory Visit: Payer: Self-pay | Admitting: Nurse Practitioner

## 2016-05-16 ENCOUNTER — Other Ambulatory Visit: Payer: Self-pay

## 2016-06-28 ENCOUNTER — Telehealth: Payer: Self-pay | Admitting: Oncology

## 2016-06-28 NOTE — Telephone Encounter (Signed)
sw pt to confirm r/s appt to 2/27 at 245 pm per LOS

## 2016-06-28 NOTE — Telephone Encounter (Signed)
Needs to reschedule 1/8 appointment to see either [name] or Dr [name].  Patient said he was unaware of the appointment.  [phone] call back number

## 2016-07-01 ENCOUNTER — Other Ambulatory Visit: Payer: Self-pay | Admitting: Oncology

## 2016-07-05 ENCOUNTER — Ambulatory Visit (HOSPITAL_BASED_OUTPATIENT_CLINIC_OR_DEPARTMENT_OTHER): Payer: Medicare Other | Admitting: Nurse Practitioner

## 2016-07-05 ENCOUNTER — Other Ambulatory Visit (HOSPITAL_BASED_OUTPATIENT_CLINIC_OR_DEPARTMENT_OTHER): Payer: Medicare Other

## 2016-07-05 ENCOUNTER — Other Ambulatory Visit: Payer: Self-pay

## 2016-07-05 ENCOUNTER — Other Ambulatory Visit (HOSPITAL_COMMUNITY)
Admission: RE | Admit: 2016-07-05 | Discharge: 2016-07-05 | Disposition: A | Discharge status: Home / Self Care | Payer: Medicare Other | Source: Ambulatory Visit | Attending: Oncology | Admitting: Oncology

## 2016-07-05 VITALS — BP 153/85 | HR 70 | Temp 99.0°F | Resp 16 | Ht 68.0 in | Wt 155.2 lb

## 2016-07-05 DIAGNOSIS — Z720 Tobacco use: Secondary | ICD-10-CM | POA: Diagnosis not present

## 2016-07-05 DIAGNOSIS — C9211 Chronic myeloid leukemia, BCR/ABL-positive, in remission: Secondary | ICD-10-CM

## 2016-07-05 DIAGNOSIS — R112 Nausea with vomiting, unspecified: Secondary | ICD-10-CM

## 2016-07-05 DIAGNOSIS — R197 Diarrhea, unspecified: Secondary | ICD-10-CM

## 2016-07-05 DIAGNOSIS — E876 Hypokalemia: Secondary | ICD-10-CM | POA: Diagnosis not present

## 2016-07-05 DIAGNOSIS — C9210 Chronic myeloid leukemia, BCR/ABL-positive, not having achieved remission: Secondary | ICD-10-CM | POA: Insufficient documentation

## 2016-07-05 DIAGNOSIS — R110 Nausea: Secondary | ICD-10-CM

## 2016-07-05 LAB — COMPREHENSIVE METABOLIC PANEL
ALBUMIN: 3.4 g/dL — AB (ref 3.5–5.0)
ALT: 21 U/L (ref 0–55)
AST: 23 U/L (ref 5–34)
Alkaline Phosphatase: 98 U/L (ref 40–150)
Anion Gap: 10 mEq/L (ref 3–11)
BUN: 19.1 mg/dL (ref 7.0–26.0)
CALCIUM: 10.2 mg/dL (ref 8.4–10.4)
CHLORIDE: 104 meq/L (ref 98–109)
CO2: 32 mEq/L — ABNORMAL HIGH (ref 22–29)
CREATININE: 1.6 mg/dL — AB (ref 0.7–1.3)
EGFR: 50 mL/min/{1.73_m2} — ABNORMAL LOW (ref >90)
Glucose: 97 mg/dl (ref 70–140)
Potassium: 3.4 mEq/L — ABNORMAL LOW (ref 3.5–5.1)
Sodium: 146 mEq/L — ABNORMAL HIGH (ref 136–145)
Total Bilirubin: 0.32 mg/dL (ref 0.20–1.20)
Total Protein: 5.9 g/dL — ABNORMAL LOW (ref 6.4–8.3)

## 2016-07-05 LAB — CBC WITH DIFFERENTIAL/PLATELET
BASO%: 0.3 % (ref 0.0–2.0)
Basophils Absolute: 0 10*3/uL (ref 0.0–0.1)
EOS%: 6.4 % (ref 0.0–7.0)
Eosinophils Absolute: 0.4 10*3/uL (ref 0.0–0.5)
HCT: 39.1 % (ref 38.4–49.9)
HGB: 12.9 g/dL — ABNORMAL LOW (ref 13.0–17.1)
LYMPH%: 21.3 % (ref 14.0–49.0)
MCH: 27.4 pg (ref 27.2–33.4)
MCHC: 33 g/dL (ref 32.0–36.0)
MCV: 83.2 fL (ref 79.3–98.0)
MONO#: 0.5 10*3/uL (ref 0.1–0.9)
MONO%: 7.3 % (ref 0.0–14.0)
NEUT#: 4.1 10*3/uL (ref 1.5–6.5)
NEUT%: 64.7 % (ref 39.0–75.0)
PLATELETS: 161 10*3/uL (ref 140–400)
RBC: 4.7 10*6/uL (ref 4.20–5.82)
RDW: 17 % — AB (ref 11.0–14.6)
WBC: 6.3 10*3/uL (ref 4.0–10.3)
lymph#: 1.3 10*3/uL (ref 0.9–3.3)
nRBC: 0 % (ref 0–0)

## 2016-07-05 LAB — AMYLASE: AMYLASE: 152 U/L — AB (ref 28–100)

## 2016-07-05 LAB — LIPASE, BLOOD: Lipase: 38 U/L (ref 11–51)

## 2016-07-05 LAB — MAGNESIUM: MAGNESIUM: 1.8 mg/dL (ref 1.5–2.5)

## 2016-07-05 MED ORDER — PANTOPRAZOLE SODIUM 40 MG PO TBEC: 40.0000 mg | DELAYED_RELEASE_TABLET | Freq: Every day | ORAL | 0 refills | Status: DC

## 2016-07-05 MED ORDER — POTASSIUM CHLORIDE CRYS ER 20 MEQ PO TBCR: 20.0000 meq | EXTENDED_RELEASE_TABLET | Freq: Every day | ORAL | 1 refills | Status: DC

## 2016-07-05 NOTE — Progress Notes (Signed)
[facility] OFFICE PROGRESS NOTE   Diagnosis:  CML  INTERVAL HISTORY:   [name] returns for follow-up. He continues [place]. He was hospitalized 04/29/2016 through 04/30/2016 with intractable nausea/vomiting.  Overall he is feeling better. He reports good compliance with Gleevec. He has noted significant improvement of diarrhea coinciding with some dietary changes. He has occasional mild nausea. Occasional dyspnea. He smokes one half pack per day.  Objective:  Vital signs in last 24 hours:  Blood pressure (!) 153/85, pulse 70, temperature 99 F (37.2 C), temperature source Oral, resp. rate 16, height 5\' 8"  (1.727 m), weight 155 lb 3.2 oz (70.4 kg), SpO2 95 %.    HEENT: No obvious mass within the oral cavity. No thrush or ulcers. Lymphatics: No palpable cervical or supraclavicular lymph nodes. Resp: Lungs clear bilaterally. Cardio: Regular rate and rhythm. GI: No splenomegaly. Vascular: No left leg edema.   Lab Results:  Lab Results  Component Value Date   WBC 6.3 07/05/2016   HGB 12.9 (L) 07/05/2016   HCT 39.1 07/05/2016   MCV 83.2 07/05/2016   PLT 161 07/05/2016   NEUTROABS 4.1 07/05/2016    Imaging:  No results found.  Medications: I have reviewed the patient's current medications.  Assessment/Plan: 1. Chronic myelogenous leukemia, diagnosed in January of 2009. He remains in hematologic remission. He is taking Gleevec at a dose of 200 mg daily. The peripheral blood PCR was markedly increased in May 2013, likely reflecting medical noncompliance. The peripheral blood PCR was significantly improved on 11/07/2011. The peripheral blood PCR was further improved on 01/31/2012. The peripheral blood PCR was increased on 04/27/2012; stable on 11/12/2012, improved on 02/12/2013; increased on 04/29/2013; increased on 07/10/2013; improved on 09/16/2013; improved on 10/28/2013; increased 02/17/2014; slightly improved 03/28/2014. Stable 04/29/2014. Increased  05/30/2014. Improved 07/26/2014. Increased 07/14/2015. Improved August 2017, stable October 2017. 2. 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 3. Status post C3-C4, C4-C5, anterior cervical diskectomy and fusion with allograft and plating 09/30/2010. 4. 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 5. Depression 6. Diarrhea, ? related to chronic pancreatitis versus Gleevec. He takes Lomotil as needed.  7. Indurated facial skin lesion 11/20/2007 with a history of MRSA skin infection of the submental area in May 2008. The induration resolved with doxycycline. 8. Left knee arthroscopy 05/25/2007. 9. Postoperative left knee effusion/pain, likely related to gout. 10. History of gout.  11. Chronic pancreatitis. 12. Status post right above-the-knee amputation. 13. MRSA infection of the submental area May 2008. 14. History of tobacco, alcohol, and cocaine use. 15. History of coronary artery disease. 16. "Shotty" lymphadenopathy of the neck, axilla, and left groin in 2009.  17. History of a microcytic anemia.  18. History of a right olecranon bursa lesion, ? gouty tophus. 19. 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 He previously took AndroGel. 20. History of anemia secondary to chronic disease and Gleevec therapy.  21. history ofAnorexia-potentially related to [place]. He reports Medicaid would not pay for Megace.  22. Chronic left knee and left foot pain.  23. Status post removal of a left knee screw 07/03/2012. 24. History of Hypokalemia. Likely related to diarrhea. 25. Status post left foot surgery. 26. Pulsatile fullness right neck. Evaluated by vascular surgery status post CT angiogram 03/07/2014 with no carotid artery aneurysm identified. 27. Cough-abnormal lung exam 05/12/2015. He completed a course of Levaquin. Chest xray negative.   Disposition: [name] remains stable from a hematologic standpoint. He will continue  Gleevec. We will follow-up on the peripheral blood PCR from today.  The mild hypokalemia is likely related to diarrhea. He will resume Kdur 20 meq daily.  He will return for a follow-up visit in 6 weeks. He will contact the office in the interim with any problems.  Plan reviewed with Dr. [name].   [name] ANP/GNP-BC   07/05/2016  4:14 PM

## 2016-07-06 MED FILL — PANTOPRAZOLE SOD DR 40 MG T: 40 | 10 days supply | Qty: 10 | Fill #0

## 2016-07-20 ENCOUNTER — Telehealth: Payer: Self-pay | Admitting: Oncology

## 2016-07-20 NOTE — Telephone Encounter (Signed)
Called and message for patient and [phone] for a call back number for questions or concerns

## 2016-08-16 ENCOUNTER — Ambulatory Visit: Payer: Self-pay | Admitting: Oncology

## 2016-08-16 ENCOUNTER — Other Ambulatory Visit: Payer: Self-pay

## 2016-08-17 ENCOUNTER — Telehealth: Payer: Self-pay | Admitting: Oncology

## 2016-08-17 NOTE — Telephone Encounter (Signed)
Pt called to r/s missed appt. Gave new appt date/time 5/7 at 230 pm per request. Pt needed afternoon appt

## 2016-08-22 ENCOUNTER — Emergency Department (HOSPITAL_COMMUNITY)
Admission: EM | Admit: 2016-08-22 | Discharge: 2016-08-22 | Disposition: A | Discharge status: Home / Self Care | Payer: Medicare Other | Attending: Emergency Medicine | Admitting: Emergency Medicine

## 2016-08-22 ENCOUNTER — Emergency Department (HOSPITAL_COMMUNITY): Payer: Medicare Other

## 2016-08-22 ENCOUNTER — Encounter (HOSPITAL_COMMUNITY): Payer: Self-pay | Admitting: Emergency Medicine

## 2016-08-22 DIAGNOSIS — F17210 Nicotine dependence, cigarettes, uncomplicated: Secondary | ICD-10-CM | POA: Insufficient documentation

## 2016-08-22 DIAGNOSIS — I10 Essential (primary) hypertension: Secondary | ICD-10-CM

## 2016-08-22 DIAGNOSIS — I252 Old myocardial infarction: Secondary | ICD-10-CM | POA: Diagnosis not present

## 2016-08-22 DIAGNOSIS — Z79899 Other long term (current) drug therapy: Secondary | ICD-10-CM | POA: Insufficient documentation

## 2016-08-22 DIAGNOSIS — J189 Pneumonia, unspecified organism: Secondary | ICD-10-CM | POA: Diagnosis not present

## 2016-08-22 DIAGNOSIS — R0602 Shortness of breath: Secondary | ICD-10-CM | POA: Diagnosis present

## 2016-08-22 DIAGNOSIS — Z96652 Presence of left artificial knee joint: Secondary | ICD-10-CM | POA: Diagnosis not present

## 2016-08-22 DIAGNOSIS — J441 Chronic obstructive pulmonary disease with (acute) exacerbation: Secondary | ICD-10-CM

## 2016-08-22 DIAGNOSIS — Z856 Personal history of leukemia: Secondary | ICD-10-CM | POA: Diagnosis not present

## 2016-08-22 LAB — BASIC METABOLIC PANEL
Anion gap: 10 (ref 5–15)
BUN: 19 mg/dL (ref 6–20)
CALCIUM: 8.6 mg/dL — AB (ref 8.9–10.3)
CO2: 26 mmol/L (ref 22–32)
Chloride: 104 mmol/L (ref 101–111)
Creatinine, Ser: 1.67 mg/dL — ABNORMAL HIGH (ref 0.61–1.24)
GFR calc Af Amer: 46 mL/min — ABNORMAL LOW (ref >60)
GFR, EST NON AFRICAN AMERICAN: 40 mL/min — AB (ref >60)
GLUCOSE: 101 mg/dL — AB (ref 65–99)
POTASSIUM: 2.8 mmol/L — AB (ref 3.5–5.1)
SODIUM: 140 mmol/L (ref 135–145)

## 2016-08-22 LAB — HEPATIC FUNCTION PANEL
ALK PHOS: 86 U/L (ref 38–126)
ALT: 30 U/L (ref 17–63)
AST: 37 U/L (ref 15–41)
Albumin: 3.3 g/dL — ABNORMAL LOW (ref 3.5–5.0)
BILIRUBIN INDIRECT: 0.5 mg/dL (ref 0.3–0.9)
BILIRUBIN TOTAL: 0.6 mg/dL (ref 0.3–1.2)
Bilirubin, Direct: 0.1 mg/dL (ref 0.1–0.5)
Total Protein: 5.7 g/dL — ABNORMAL LOW (ref 6.5–8.1)

## 2016-08-22 LAB — CBC
HCT: 39.1 % (ref 39.0–52.0)
Hemoglobin: 12.8 g/dL — ABNORMAL LOW (ref 13.0–17.0)
MCH: 27.2 pg (ref 26.0–34.0)
MCHC: 32.7 g/dL (ref 30.0–36.0)
MCV: 83.2 fL (ref 78.0–100.0)
PLATELETS: 146 10*3/uL — AB (ref 150–400)
RBC: 4.7 MIL/uL (ref 4.22–5.81)
RDW: 15.7 % — AB (ref 11.5–15.5)
WBC: 5.6 10*3/uL (ref 4.0–10.5)

## 2016-08-22 LAB — I-STAT TROPONIN, ED
TROPONIN I, POC: 0.01 ng/mL (ref 0.00–0.08)
TROPONIN I, POC: 0.01 ng/mL (ref 0.00–0.08)

## 2016-08-22 LAB — BRAIN NATRIURETIC PEPTIDE: B NATRIURETIC PEPTIDE 5: 24.1 pg/mL (ref 0.0–100.0)

## 2016-08-22 LAB — LIPASE, BLOOD: Lipase: 73 U/L — ABNORMAL HIGH (ref 11–51)

## 2016-08-22 MED ORDER — LOSARTAN POTASSIUM 50 MG PO TABS
100.0000 mg | ORAL_TABLET | Freq: Once | ORAL | Status: AC
  Administered 2016-08-22: 100 mg via ORAL
  Filled 2016-08-22: qty 2

## 2016-08-22 MED ORDER — PREDNISONE 20 MG PO TABS
60.0000 mg | ORAL_TABLET | Freq: Once | ORAL | Status: AC
  Administered 2016-08-22: 60 mg via ORAL
  Filled 2016-08-22: qty 3

## 2016-08-22 MED ORDER — HYDROCODONE-ACETAMINOPHEN 5-325 MG PO TABS
2.0000 | ORAL_TABLET | Freq: Once | ORAL | Status: AC
  Administered 2016-08-22: 2 via ORAL
  Filled 2016-08-22: qty 2

## 2016-08-22 MED ORDER — AMLODIPINE BESYLATE 5 MG PO TABS
10.0000 mg | ORAL_TABLET | Freq: Once | ORAL | Status: AC
  Administered 2016-08-22: 10 mg via ORAL
  Filled 2016-08-22: qty 2

## 2016-08-22 MED ORDER — HYDROCODONE-HOMATROPINE 5-1.5 MG/5ML PO SYRP: 5.0000 mL | ORAL_SOLUTION | Freq: Four times a day (QID) | ORAL | 0 refills | Status: DC | PRN

## 2016-08-22 MED ORDER — PREDNISONE 20 MG PO TABS: 40.0000 mg | ORAL_TABLET | Freq: Every day | ORAL | 0 refills | Status: AC

## 2016-08-22 MED ORDER — MAGNESIUM SULFATE 2 GM/50ML IV SOLN
2.0000 g | Freq: Once | INTRAVENOUS | Status: AC
  Administered 2016-08-22: 2 g via INTRAVENOUS
  Filled 2016-08-22: qty 50

## 2016-08-22 MED ORDER — IPRATROPIUM-ALBUTEROL 0.5-2.5 (3) MG/3ML IN SOLN
3.0000 mL | Freq: Once | RESPIRATORY_TRACT | Status: AC
  Administered 2016-08-22: 3 mL via RESPIRATORY_TRACT
  Filled 2016-08-22: qty 3

## 2016-08-22 MED ORDER — DOXYCYCLINE HYCLATE 100 MG PO CAPS: 100.0000 mg | ORAL_CAPSULE | Freq: Two times a day (BID) | ORAL | 0 refills | Status: AC

## 2016-08-22 MED ORDER — DOXYCYCLINE HYCLATE 100 MG PO TABS
100.0000 mg | ORAL_TABLET | Freq: Once | ORAL | Status: AC
  Administered 2016-08-22: 100 mg via ORAL
  Filled 2016-08-22: qty 1

## 2016-08-22 MED ORDER — SODIUM CHLORIDE 0.9 % IV SOLN
30.0000 meq | Freq: Once | INTRAVENOUS | Status: AC
  Administered 2016-08-22: 30 meq via INTRAVENOUS
  Filled 2016-08-22: qty 15

## 2016-08-22 MED ORDER — ALBUTEROL SULFATE HFA 108 (90 BASE) MCG/ACT IN AERS
2.0000 | INHALATION_SPRAY | Freq: Once | RESPIRATORY_TRACT | Status: AC
  Administered 2016-08-22: 2 via RESPIRATORY_TRACT
  Filled 2016-08-22: qty 6.7

## 2016-08-22 MED ORDER — POTASSIUM CHLORIDE CRYS ER 20 MEQ PO TBCR: 40.0000 meq | EXTENDED_RELEASE_TABLET | Freq: Every day | ORAL | 0 refills | Status: DC

## 2016-08-22 MED ORDER — POTASSIUM CHLORIDE CRYS ER 20 MEQ PO TBCR
40.0000 meq | EXTENDED_RELEASE_TABLET | Freq: Once | ORAL | Status: AC
  Administered 2016-08-22: 40 meq via ORAL
  Filled 2016-08-22: qty 2

## 2016-08-22 NOTE — ED Triage Notes (Signed)
Pt arrives by [facility] for a cough and cold like symptoms for over 1 week. Pt states today he has been having substernal chest pain with coughing. Denies any SOB.

## 2016-08-22 NOTE — Discharge Instructions (Signed)
-   Use your inhaler every 4 hours as needed for wheezing - Take the full course of your antibiotics - Follow-up with your doctor in 2-3 days - Take all of your blood pressure medications
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
## 2016-08-22 NOTE — ED Notes (Signed)
Pt taken to xray 

## 2016-08-24 ENCOUNTER — Ambulatory Visit (INDEPENDENT_AMBULATORY_CARE_PROVIDER_SITE_OTHER): Payer: Self-pay | Admitting: Orthopaedic Surgery

## 2016-08-24 ENCOUNTER — Telehealth (INDEPENDENT_AMBULATORY_CARE_PROVIDER_SITE_OTHER): Payer: Self-pay | Admitting: Orthopaedic Surgery

## 2016-08-24 NOTE — Telephone Encounter (Signed)
Returned call to patient no answer  [phone]

## 2016-09-05 ENCOUNTER — Ambulatory Visit (INDEPENDENT_AMBULATORY_CARE_PROVIDER_SITE_OTHER): Payer: Medicare Other | Admitting: Physician Assistant

## 2016-09-06 DIAGNOSIS — M109 Gout, unspecified: Secondary | ICD-10-CM

## 2016-09-06 DIAGNOSIS — J9600 Acute respiratory failure, unspecified whether with hypoxia or hypercapnia: Secondary | ICD-10-CM

## 2016-09-06 DIAGNOSIS — E860 Dehydration: Secondary | ICD-10-CM

## 2016-09-06 HISTORY — DX: Acute respiratory failure, unspecified whether with hypoxia or hypercapnia: J96.00

## 2016-09-06 HISTORY — DX: Gout, unspecified: M10.9

## 2016-09-06 HISTORY — DX: Dehydration: E86.0

## 2016-09-10 ENCOUNTER — Encounter (HOSPITAL_COMMUNITY): Payer: Self-pay

## 2016-09-10 ENCOUNTER — Emergency Department (HOSPITAL_COMMUNITY): Payer: Medicare Other

## 2016-09-10 ENCOUNTER — Observation Stay (HOSPITAL_COMMUNITY)
Admission: EM | Admit: 2016-09-10 | Discharge: 2016-09-11 | Disposition: A | Discharge status: Home / Self Care | Payer: Medicare Other | Attending: Internal Medicine | Admitting: Internal Medicine

## 2016-09-10 DIAGNOSIS — Z8249 Family history of ischemic heart disease and other diseases of the circulatory system: Secondary | ICD-10-CM | POA: Insufficient documentation

## 2016-09-10 DIAGNOSIS — F329 Major depressive disorder, single episode, unspecified: Secondary | ICD-10-CM | POA: Diagnosis not present

## 2016-09-10 DIAGNOSIS — I252 Old myocardial infarction: Secondary | ICD-10-CM | POA: Insufficient documentation

## 2016-09-10 DIAGNOSIS — I10 Essential (primary) hypertension: Secondary | ICD-10-CM | POA: Diagnosis present

## 2016-09-10 DIAGNOSIS — Z89611 Acquired absence of right leg above knee: Secondary | ICD-10-CM | POA: Diagnosis not present

## 2016-09-10 DIAGNOSIS — I2510 Atherosclerotic heart disease of native coronary artery without angina pectoris: Secondary | ICD-10-CM | POA: Diagnosis not present

## 2016-09-10 DIAGNOSIS — C9210 Chronic myeloid leukemia, BCR/ABL-positive, not having achieved remission: Secondary | ICD-10-CM | POA: Diagnosis present

## 2016-09-10 DIAGNOSIS — F419 Anxiety disorder, unspecified: Secondary | ICD-10-CM | POA: Diagnosis not present

## 2016-09-10 DIAGNOSIS — K861 Other chronic pancreatitis: Secondary | ICD-10-CM | POA: Insufficient documentation

## 2016-09-10 DIAGNOSIS — Z7982 Long term (current) use of aspirin: Secondary | ICD-10-CM | POA: Insufficient documentation

## 2016-09-10 DIAGNOSIS — N179 Acute kidney failure, unspecified: Secondary | ICD-10-CM | POA: Diagnosis not present

## 2016-09-10 DIAGNOSIS — R079 Chest pain, unspecified: Secondary | ICD-10-CM | POA: Diagnosis not present

## 2016-09-10 DIAGNOSIS — Z87828 Personal history of other (healed) physical injury and trauma: Secondary | ICD-10-CM | POA: Diagnosis not present

## 2016-09-10 DIAGNOSIS — R252 Cramp and spasm: Secondary | ICD-10-CM | POA: Diagnosis not present

## 2016-09-10 DIAGNOSIS — Z955 Presence of coronary angioplasty implant and graft: Secondary | ICD-10-CM | POA: Diagnosis not present

## 2016-09-10 DIAGNOSIS — E876 Hypokalemia: Secondary | ICD-10-CM | POA: Insufficient documentation

## 2016-09-10 DIAGNOSIS — Z720 Tobacco use: Secondary | ICD-10-CM | POA: Diagnosis present

## 2016-09-10 DIAGNOSIS — Z7951 Long term (current) use of inhaled steroids: Secondary | ICD-10-CM | POA: Diagnosis not present

## 2016-09-10 DIAGNOSIS — R072 Precordial pain: Secondary | ICD-10-CM | POA: Diagnosis present

## 2016-09-10 DIAGNOSIS — R0789 Other chest pain: Principal | ICD-10-CM | POA: Insufficient documentation

## 2016-09-10 DIAGNOSIS — F17210 Nicotine dependence, cigarettes, uncomplicated: Secondary | ICD-10-CM | POA: Insufficient documentation

## 2016-09-10 DIAGNOSIS — Z79899 Other long term (current) drug therapy: Secondary | ICD-10-CM | POA: Diagnosis not present

## 2016-09-10 DIAGNOSIS — C9211 Chronic myeloid leukemia, BCR/ABL-positive, in remission: Secondary | ICD-10-CM | POA: Diagnosis not present

## 2016-09-10 LAB — TROPONIN I
TROPONIN I: 0.03 ng/mL — AB (ref <0.03)
Troponin I: 0.03 ng/mL (ref <0.03)

## 2016-09-10 LAB — BASIC METABOLIC PANEL
ANION GAP: 11 (ref 5–15)
BUN: 10 mg/dL (ref 6–20)
CHLORIDE: 105 mmol/L (ref 101–111)
CO2: 22 mmol/L (ref 22–32)
Calcium: 8.9 mg/dL (ref 8.9–10.3)
Creatinine, Ser: 1.44 mg/dL — ABNORMAL HIGH (ref 0.61–1.24)
GFR calc Af Amer: 55 mL/min — ABNORMAL LOW (ref >60)
GFR calc non Af Amer: 47 mL/min — ABNORMAL LOW (ref >60)
Glucose, Bld: 111 mg/dL — ABNORMAL HIGH (ref 65–99)
Potassium: 3 mmol/L — ABNORMAL LOW (ref 3.5–5.1)
Sodium: 138 mmol/L (ref 135–145)

## 2016-09-10 LAB — I-STAT TROPONIN, ED: TROPONIN I, POC: 0.01 ng/mL (ref 0.00–0.08)

## 2016-09-10 LAB — CBC
HCT: 38.2 % — ABNORMAL LOW (ref 39.0–52.0)
Hemoglobin: 12.5 g/dL — ABNORMAL LOW (ref 13.0–17.0)
MCH: 26.9 pg (ref 26.0–34.0)
MCHC: 32.7 g/dL (ref 30.0–36.0)
MCV: 82.3 fL (ref 78.0–100.0)
Platelets: 164 10*3/uL (ref 150–400)
RBC: 4.64 MIL/uL (ref 4.22–5.81)
RDW: 16.5 % — AB (ref 11.5–15.5)
WBC: 5.8 10*3/uL (ref 4.0–10.5)

## 2016-09-10 MED ORDER — PANTOPRAZOLE SODIUM 40 MG PO TBEC
40.0000 mg | DELAYED_RELEASE_TABLET | Freq: Every day | ORAL | Status: DC
  Administered 2016-09-10 – 2016-09-11 (×2): 40 mg via ORAL
  Filled 2016-09-10 (×2): qty 1

## 2016-09-10 MED ORDER — VILAZODONE HCL 20 MG PO TABS
40.0000 mg | ORAL_TABLET | Freq: Every day | ORAL | Status: DC
  Administered 2016-09-11: 40 mg via ORAL
  Filled 2016-09-10: qty 2

## 2016-09-10 MED ORDER — AZITHROMYCIN 250 MG PO TABS
250.0000 mg | ORAL_TABLET | Freq: Once | ORAL | Status: AC
  Administered 2016-09-11: 250 mg via ORAL
  Filled 2016-09-10: qty 1

## 2016-09-10 MED ORDER — GI COCKTAIL ~~LOC~~: 30.0000 mL | Freq: Four times a day (QID) | ORAL | Status: DC | PRN

## 2016-09-10 MED ORDER — CLONIDINE HCL 0.1 MG PO TABS: 0.1000 mg | ORAL_TABLET | Freq: Every day | ORAL | Status: DC

## 2016-09-10 MED ORDER — ACETAMINOPHEN 325 MG PO TABS
650.0000 mg | ORAL_TABLET | ORAL | Status: DC | PRN
  Administered 2016-09-11: 650 mg via ORAL
  Filled 2016-09-10: qty 2

## 2016-09-10 MED ORDER — ENOXAPARIN SODIUM 80 MG/0.8ML ~~LOC~~ SOLN
70.0000 mg | Freq: Once | SUBCUTANEOUS | Status: AC
  Administered 2016-09-10: 16:00:00 70 mg via SUBCUTANEOUS
  Filled 2016-09-10: qty 0.8

## 2016-09-10 MED ORDER — PROMETHAZINE HCL 25 MG PO TABS: 12.5000 mg | ORAL_TABLET | Freq: Four times a day (QID) | ORAL | Status: DC | PRN

## 2016-09-10 MED ORDER — ZOLPIDEM TARTRATE 5 MG PO TABS: 5.0000 mg | ORAL_TABLET | Freq: Every evening | ORAL | Status: DC | PRN

## 2016-09-10 MED ORDER — ALLOPURINOL 300 MG PO TABS
300.0000 mg | ORAL_TABLET | Freq: Every day | ORAL | Status: DC
  Administered 2016-09-11: 300 mg via ORAL
  Filled 2016-09-10: qty 1

## 2016-09-10 MED ORDER — AMLODIPINE BESYLATE 10 MG PO TABS
10.0000 mg | ORAL_TABLET | Freq: Every day | ORAL | Status: DC
  Administered 2016-09-11: 10 mg via ORAL
  Filled 2016-09-10: qty 1

## 2016-09-10 MED ORDER — PANCRELIPASE (LIP-PROT-AMYL) 12000-38000 UNITS PO CPEP
24000.0000 [IU] | ORAL_CAPSULE | Freq: Three times a day (TID) | ORAL | Status: DC
  Administered 2016-09-10 – 2016-09-11 (×3): 24000 [IU] via ORAL
  Filled 2016-09-10 (×3): qty 2

## 2016-09-10 MED ORDER — NITROGLYCERIN 2 % TD OINT
0.5000 [in_us] | TOPICAL_OINTMENT | Freq: Four times a day (QID) | TRANSDERMAL | Status: DC
  Administered 2016-09-10 – 2016-09-11 (×6): 0.5 [in_us] via TOPICAL
  Filled 2016-09-10: qty 30
  Filled 2016-09-10: qty 1

## 2016-09-10 MED ORDER — ASPIRIN EC 325 MG PO TBEC
325.0000 mg | DELAYED_RELEASE_TABLET | Freq: Every day | ORAL | Status: DC
  Administered 2016-09-11: 325 mg via ORAL
  Filled 2016-09-10: qty 1

## 2016-09-10 MED ORDER — ASPIRIN 81 MG PO CHEW
324.0000 mg | CHEWABLE_TABLET | Freq: Once | ORAL | Status: AC
  Administered 2016-09-10: 324 mg via ORAL
  Filled 2016-09-10: qty 4

## 2016-09-10 MED ORDER — ALPRAZOLAM 0.25 MG PO TABS: 0.2500 mg | ORAL_TABLET | Freq: Two times a day (BID) | ORAL | Status: DC | PRN

## 2016-09-10 MED ORDER — METOPROLOL TARTRATE 25 MG PO TABS: 25.0000 mg | ORAL_TABLET | Freq: Two times a day (BID) | ORAL | Status: DC

## 2016-09-10 MED ORDER — MAGNESIUM OXIDE 400 (241.3 MG) MG PO TABS
400.0000 mg | ORAL_TABLET | Freq: Every day | ORAL | Status: DC
  Administered 2016-09-11: 400 mg via ORAL
  Filled 2016-09-10 (×2): qty 1

## 2016-09-10 MED ORDER — IMATINIB MESYLATE 100 MG PO TABS
200.0000 mg | ORAL_TABLET | Freq: Every day | ORAL | Status: DC
  Administered 2016-09-11: 200 mg via ORAL
  Filled 2016-09-10: qty 2

## 2016-09-10 MED ORDER — METOPROLOL SUCCINATE ER 50 MG PO TB24
50.0000 mg | ORAL_TABLET | Freq: Every day | ORAL | Status: DC
  Administered 2016-09-11: 50 mg via ORAL
  Filled 2016-09-10: qty 1

## 2016-09-10 MED ORDER — HYDROCHLOROTHIAZIDE 25 MG PO TABS
25.0000 mg | ORAL_TABLET | Freq: Every day | ORAL | Status: DC
  Administered 2016-09-11: 25 mg via ORAL
  Filled 2016-09-10: qty 1

## 2016-09-10 MED ORDER — MORPHINE SULFATE (PF) 4 MG/ML IV SOLN
2.0000 mg | INTRAVENOUS | Status: DC | PRN
  Administered 2016-09-11: 2 mg via INTRAVENOUS
  Filled 2016-09-10: qty 1

## 2016-09-10 MED ORDER — SIMVASTATIN 20 MG PO TABS
20.0000 mg | ORAL_TABLET | Freq: Every day | ORAL | Status: DC
  Administered 2016-09-10: 20 mg via ORAL
  Filled 2016-09-10: qty 1

## 2016-09-10 MED ORDER — LOSARTAN POTASSIUM 50 MG PO TABS
100.0000 mg | ORAL_TABLET | Freq: Every day | ORAL | Status: DC
  Administered 2016-09-11: 100 mg via ORAL
  Filled 2016-09-10: qty 2

## 2016-09-10 MED ORDER — LOSARTAN POTASSIUM-HCTZ 100-25 MG PO TABS: 1.0000 | ORAL_TABLET | Freq: Every day | ORAL | Status: DC

## 2016-09-10 MED ORDER — COLCHICINE 0.6 MG PO TABS
0.6000 mg | ORAL_TABLET | Freq: Every day | ORAL | Status: DC
  Administered 2016-09-11: 0.6 mg via ORAL
  Filled 2016-09-10: qty 1

## 2016-09-10 MED ORDER — ALBUTEROL SULFATE (2.5 MG/3ML) 0.083% IN NEBU
2.5000 mg | INHALATION_SOLUTION | Freq: Four times a day (QID) | RESPIRATORY_TRACT | Status: DC | PRN
Start: 2016-09-10 — End: 2016-09-11

## 2016-09-10 MED ORDER — SODIUM CHLORIDE 0.9 % IV SOLN
INTRAVENOUS | Status: DC
  Administered 2016-09-10: 16:00:00 via INTRAVENOUS

## 2016-09-10 MED ORDER — ENOXAPARIN SODIUM 80 MG/0.8ML ~~LOC~~ SOLN
70.0000 mg | Freq: Two times a day (BID) | SUBCUTANEOUS | Status: DC
  Administered 2016-09-11: 01:00:00 70 mg via SUBCUTANEOUS
  Filled 2016-09-10: qty 0.8

## 2016-09-10 MED ORDER — VILAZODONE HCL 40 MG PO TABS
40.0000 mg | ORAL_TABLET | Freq: Every day | ORAL | Status: DC
Start: 2016-09-10 — End: 2016-09-10

## 2016-09-10 MED ORDER — ONDANSETRON HCL 4 MG/2ML IJ SOLN
4.0000 mg | Freq: Four times a day (QID) | INTRAMUSCULAR | Status: DC | PRN
Start: 2016-09-10 — End: 2016-09-11
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
## 2016-09-10 NOTE — ED Notes (Signed)
Report given.
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
## 2016-09-10 NOTE — ED Notes (Signed)
Report attempted x 1

## 2016-09-10 NOTE — ED Triage Notes (Addendum)
Patient comes in per [facility] with c/o substernal cp, cramping. 10/10. Now pain is resolved. No nitro given. Cp lasted for about 5-10 mins. 324 mg aspirin. ekg unremarkable. 20 LAC. Recent dx of pneumonia. PO antix given. Patient does not feel better. Productive cough - yellow/white sputum. Fm home. Ems v/s 133/97, 91 HR, 20 RR, 98% RA. cbg 128. Hx of smoking, high cholesterol, and MI with stents.
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
## 2016-09-10 NOTE — ED Notes (Signed)
Nuclear medicine called and stated that the Stress test will be tomorrow  NPO after midnight

## 2016-09-11 ENCOUNTER — Observation Stay (HOSPITAL_BASED_OUTPATIENT_CLINIC_OR_DEPARTMENT_OTHER): Payer: Medicare Other

## 2016-09-11 DIAGNOSIS — R079 Chest pain, unspecified: Secondary | ICD-10-CM | POA: Diagnosis not present

## 2016-09-11 DIAGNOSIS — R0789 Other chest pain: Secondary | ICD-10-CM | POA: Diagnosis not present

## 2016-09-11 DIAGNOSIS — R252 Cramp and spasm: Secondary | ICD-10-CM | POA: Diagnosis not present

## 2016-09-11 DIAGNOSIS — I10 Essential (primary) hypertension: Secondary | ICD-10-CM

## 2016-09-11 DIAGNOSIS — N179 Acute kidney failure, unspecified: Secondary | ICD-10-CM | POA: Diagnosis not present

## 2016-09-11 LAB — NM MYOCAR MULTI W/SPECT W/WALL MOTION / EF
CHL CUP RESTING HR STRESS: 85 {beats}/min
CSEPEW: 1 METS
Exercise duration (min): 5 min
MPHR: 149 {beats}/min
Peak HR: 118 {beats}/min
Percent HR: 79 %

## 2016-09-11 LAB — MAGNESIUM: MAGNESIUM: 1.7 mg/dL (ref 1.7–2.4)

## 2016-09-11 LAB — TROPONIN I: TROPONIN I: 0.04 ng/mL — AB (ref <0.03)

## 2016-09-11 MED ORDER — ASPIRIN EC 81 MG PO TBEC: 81.0000 mg | DELAYED_RELEASE_TABLET | Freq: Every day | ORAL | Status: DC

## 2016-09-11 MED ORDER — MAGNESIUM SULFATE IN D5W 1-5 GM/100ML-% IV SOLN
1.0000 g | Freq: Once | INTRAVENOUS | Status: AC
  Administered 2016-09-11: 1 g via INTRAVENOUS
  Filled 2016-09-11: qty 100

## 2016-09-11 MED ORDER — POTASSIUM CHLORIDE CRYS ER 20 MEQ PO TBCR
40.0000 meq | EXTENDED_RELEASE_TABLET | Freq: Once | ORAL | Status: AC
  Administered 2016-09-11: 40 meq via ORAL
  Filled 2016-09-11: qty 2

## 2016-09-11 MED ORDER — HYDRALAZINE HCL 20 MG/ML IJ SOLN
10.0000 mg | Freq: Once | INTRAMUSCULAR | Status: AC
  Administered 2016-09-11: 10 mg via INTRAVENOUS
  Filled 2016-09-11: qty 1

## 2016-09-11 MED ORDER — MAGNESIUM OXIDE 400 (241.3 MG) MG PO TABS: 400.0000 mg | ORAL_TABLET | Freq: Every day | ORAL | Status: DC

## 2016-09-11 MED ORDER — REGADENOSON 0.4 MG/5ML IV SOLN
INTRAVENOUS | Status: AC
  Filled 2016-09-11: qty 5

## 2016-09-11 MED ORDER — ENOXAPARIN SODIUM 80 MG/0.8ML ~~LOC~~ SOLN
1.0000 mg/kg | Freq: Two times a day (BID) | SUBCUTANEOUS | Status: DC
  Administered 2016-09-11: 11:00:00 65 mg via SUBCUTANEOUS
  Filled 2016-09-11: qty 0.8

## 2016-09-11 MED ORDER — TECHNETIUM TC 99M TETROFOSMIN IV KIT
30.0000 | PACK | Freq: Once | INTRAVENOUS | Status: AC | PRN
  Administered 2016-09-11: 30 via INTRAVENOUS

## 2016-09-11 MED ORDER — TECHNETIUM TC 99M TETROFOSMIN IV KIT
10.0000 | PACK | Freq: Once | INTRAVENOUS | Status: AC | PRN
Start: 2016-09-11 — End: 2016-09-11
  Administered 2016-09-11: 10 via INTRAVENOUS

## 2016-09-11 MED ORDER — REGADENOSON 0.4 MG/5ML IV SOLN
0.4000 mg | Freq: Once | INTRAVENOUS | Status: AC
  Administered 2016-09-11: 0.4 mg via INTRAVENOUS
  Filled 2016-09-11: qty 5
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
## 2016-09-11 NOTE — Progress Notes (Signed)
Discharge instructions reviewed with pt. Prescription given to pt. Pt has no questions at this time. Pt stated he can drive him self to his follow up appointment or family could. Pt stated he will call to schedule is follow up appointment with his pcp.  Pt stated he is ready to go. Pt wheeled out.

## 2016-09-12 ENCOUNTER — Other Ambulatory Visit: Payer: Self-pay

## 2016-09-12 ENCOUNTER — Ambulatory Visit: Payer: Self-pay | Admitting: Oncology

## 2016-09-16 ENCOUNTER — Other Ambulatory Visit: Payer: Self-pay

## 2016-09-16 ENCOUNTER — Ambulatory Visit: Payer: Self-pay | Admitting: Nurse Practitioner

## 2016-09-19 ENCOUNTER — Emergency Department (HOSPITAL_COMMUNITY)
Admission: EM | Admit: 2016-09-19 | Discharge: 2016-09-19 | Disposition: A | Discharge status: Home / Self Care | Payer: Medicare Other | Attending: Emergency Medicine | Admitting: Emergency Medicine

## 2016-09-19 ENCOUNTER — Encounter (HOSPITAL_COMMUNITY): Payer: Self-pay

## 2016-09-19 DIAGNOSIS — F10120 Alcohol abuse with intoxication, uncomplicated: Secondary | ICD-10-CM | POA: Insufficient documentation

## 2016-09-19 DIAGNOSIS — F10920 Alcohol use, unspecified with intoxication, uncomplicated: Secondary | ICD-10-CM

## 2016-09-19 NOTE — ED Notes (Signed)
Delay in blood draw,  Pt no cooperating.Marland KitchenMarland KitchenSecurity at bedside.

## 2016-09-19 NOTE — ED Notes (Signed)
Pt states he wants to leave.  Slid to end of bed sitting up on personal cell phone to arrange a ride home.  PA notified and now at bedside.  Pt refused urine sample.

## 2016-09-19 NOTE — ED Notes (Signed)
Pt refusing discharge vitals, labs, urine.

## 2016-09-19 NOTE — ED Notes (Signed)
Pt's brother just arrived to give pt a ride home.  Security, [employer], Dr. Johnney Killian and PA at bedside.

## 2016-09-19 NOTE — ED Notes (Signed)
Pt at nurses station, looking for brother which is his ride home.  [employer] and security at bedside.

## 2016-09-19 NOTE — Discharge Instructions (Signed)
Please read instructions below.
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
## 2016-09-19 NOTE — ED Notes (Signed)
PA handed pt's brother discharge paperwork.  Papers were not reviewed with pt or family.  No signature recorded

## 2016-09-20 ENCOUNTER — Emergency Department (HOSPITAL_COMMUNITY): Payer: Medicare Other

## 2016-09-20 ENCOUNTER — Inpatient Hospital Stay (HOSPITAL_COMMUNITY)
Admission: EM | Admit: 2016-09-20 | Discharge: 2016-10-07 | DRG: 896 | Disposition: A | Discharge status: Home Health | Payer: Medicare Other | Attending: Internal Medicine | Admitting: Internal Medicine

## 2016-09-20 ENCOUNTER — Encounter (HOSPITAL_COMMUNITY): Payer: Self-pay

## 2016-09-20 DIAGNOSIS — J9620 Acute and chronic respiratory failure, unspecified whether with hypoxia or hypercapnia: Secondary | ICD-10-CM | POA: Diagnosis not present

## 2016-09-20 DIAGNOSIS — J9600 Acute respiratory failure, unspecified whether with hypoxia or hypercapnia: Secondary | ICD-10-CM

## 2016-09-20 DIAGNOSIS — E876 Hypokalemia: Secondary | ICD-10-CM | POA: Diagnosis present

## 2016-09-20 DIAGNOSIS — K709 Alcoholic liver disease, unspecified: Secondary | ICD-10-CM | POA: Diagnosis present

## 2016-09-20 DIAGNOSIS — I10 Essential (primary) hypertension: Secondary | ICD-10-CM | POA: Diagnosis present

## 2016-09-20 DIAGNOSIS — F10239 Alcohol dependence with withdrawal, unspecified: Principal | ICD-10-CM | POA: Diagnosis present

## 2016-09-20 DIAGNOSIS — M109 Gout, unspecified: Secondary | ICD-10-CM | POA: Diagnosis present

## 2016-09-20 DIAGNOSIS — F322 Major depressive disorder, single episode, severe without psychotic features: Secondary | ICD-10-CM | POA: Diagnosis present

## 2016-09-20 DIAGNOSIS — K760 Fatty (change of) liver, not elsewhere classified: Secondary | ICD-10-CM | POA: Diagnosis present

## 2016-09-20 DIAGNOSIS — J9621 Acute and chronic respiratory failure with hypoxia: Secondary | ICD-10-CM | POA: Diagnosis present

## 2016-09-20 DIAGNOSIS — R680 Hypothermia, not associated with low environmental temperature: Secondary | ICD-10-CM | POA: Diagnosis present

## 2016-09-20 DIAGNOSIS — A419 Sepsis, unspecified organism: Secondary | ICD-10-CM | POA: Diagnosis not present

## 2016-09-20 DIAGNOSIS — R0603 Acute respiratory distress: Secondary | ICD-10-CM | POA: Diagnosis not present

## 2016-09-20 DIAGNOSIS — J690 Pneumonitis due to inhalation of food and vomit: Secondary | ICD-10-CM | POA: Diagnosis not present

## 2016-09-20 DIAGNOSIS — D6489 Other specified anemias: Secondary | ICD-10-CM | POA: Diagnosis present

## 2016-09-20 DIAGNOSIS — R451 Restlessness and agitation: Secondary | ICD-10-CM | POA: Diagnosis not present

## 2016-09-20 DIAGNOSIS — F419 Anxiety disorder, unspecified: Secondary | ICD-10-CM | POA: Diagnosis present

## 2016-09-20 DIAGNOSIS — I714 Abdominal aortic aneurysm, without rupture: Secondary | ICD-10-CM | POA: Diagnosis present

## 2016-09-20 DIAGNOSIS — R4182 Altered mental status, unspecified: Secondary | ICD-10-CM

## 2016-09-20 DIAGNOSIS — Z452 Encounter for adjustment and management of vascular access device: Secondary | ICD-10-CM

## 2016-09-20 DIAGNOSIS — E872 Acidosis: Secondary | ICD-10-CM | POA: Diagnosis present

## 2016-09-20 DIAGNOSIS — B952 Enterococcus as the cause of diseases classified elsewhere: Secondary | ICD-10-CM | POA: Diagnosis not present

## 2016-09-20 DIAGNOSIS — E870 Hyperosmolality and hypernatremia: Secondary | ICD-10-CM | POA: Diagnosis not present

## 2016-09-20 DIAGNOSIS — J449 Chronic obstructive pulmonary disease, unspecified: Secondary | ICD-10-CM | POA: Diagnosis present

## 2016-09-20 DIAGNOSIS — J9601 Acute respiratory failure with hypoxia: Secondary | ICD-10-CM

## 2016-09-20 DIAGNOSIS — R739 Hyperglycemia, unspecified: Secondary | ICD-10-CM | POA: Diagnosis not present

## 2016-09-20 DIAGNOSIS — Z781 Physical restraint status: Secondary | ICD-10-CM

## 2016-09-20 DIAGNOSIS — D638 Anemia in other chronic diseases classified elsewhere: Secondary | ICD-10-CM | POA: Diagnosis present

## 2016-09-20 DIAGNOSIS — Z89611 Acquired absence of right leg above knee: Secondary | ICD-10-CM | POA: Diagnosis not present

## 2016-09-20 DIAGNOSIS — F10921 Alcohol use, unspecified with intoxication delirium: Secondary | ICD-10-CM

## 2016-09-20 DIAGNOSIS — R509 Fever, unspecified: Secondary | ICD-10-CM

## 2016-09-20 DIAGNOSIS — M7021 Olecranon bursitis, right elbow: Secondary | ICD-10-CM | POA: Diagnosis present

## 2016-09-20 DIAGNOSIS — D72829 Elevated white blood cell count, unspecified: Secondary | ICD-10-CM | POA: Diagnosis not present

## 2016-09-20 DIAGNOSIS — N179 Acute kidney failure, unspecified: Secondary | ICD-10-CM | POA: Diagnosis not present

## 2016-09-20 DIAGNOSIS — G9341 Metabolic encephalopathy: Secondary | ICD-10-CM | POA: Diagnosis present

## 2016-09-20 DIAGNOSIS — C9211 Chronic myeloid leukemia, BCR/ABL-positive, in remission: Secondary | ICD-10-CM | POA: Diagnosis present

## 2016-09-20 DIAGNOSIS — K7689 Other specified diseases of liver: Secondary | ICD-10-CM | POA: Diagnosis present

## 2016-09-20 DIAGNOSIS — E785 Hyperlipidemia, unspecified: Secondary | ICD-10-CM | POA: Diagnosis present

## 2016-09-20 DIAGNOSIS — K567 Ileus, unspecified: Secondary | ICD-10-CM

## 2016-09-20 DIAGNOSIS — T380X5A Adverse effect of glucocorticoids and synthetic analogues, initial encounter: Secondary | ICD-10-CM | POA: Diagnosis not present

## 2016-09-20 DIAGNOSIS — K861 Other chronic pancreatitis: Secondary | ICD-10-CM | POA: Diagnosis present

## 2016-09-20 DIAGNOSIS — F10230 Alcohol dependence with withdrawal, uncomplicated: Secondary | ICD-10-CM | POA: Diagnosis not present

## 2016-09-20 DIAGNOSIS — G9340 Encephalopathy, unspecified: Secondary | ICD-10-CM | POA: Diagnosis not present

## 2016-09-20 DIAGNOSIS — I5030 Unspecified diastolic (congestive) heart failure: Secondary | ICD-10-CM | POA: Diagnosis not present

## 2016-09-20 DIAGNOSIS — J9691 Respiratory failure, unspecified with hypoxia: Secondary | ICD-10-CM | POA: Diagnosis present

## 2016-09-20 DIAGNOSIS — E860 Dehydration: Secondary | ICD-10-CM | POA: Diagnosis not present

## 2016-09-20 DIAGNOSIS — N3000 Acute cystitis without hematuria: Secondary | ICD-10-CM

## 2016-09-20 DIAGNOSIS — J9602 Acute respiratory failure with hypercapnia: Secondary | ICD-10-CM | POA: Diagnosis not present

## 2016-09-20 DIAGNOSIS — M1030 Gout due to renal impairment, unspecified site: Secondary | ICD-10-CM | POA: Diagnosis not present

## 2016-09-20 DIAGNOSIS — R5081 Fever presenting with conditions classified elsewhere: Secondary | ICD-10-CM

## 2016-09-20 DIAGNOSIS — N390 Urinary tract infection, site not specified: Secondary | ICD-10-CM | POA: Diagnosis present

## 2016-09-20 DIAGNOSIS — M7989 Other specified soft tissue disorders: Secondary | ICD-10-CM | POA: Diagnosis not present

## 2016-09-20 DIAGNOSIS — L03119 Cellulitis of unspecified part of limb: Secondary | ICD-10-CM

## 2016-09-20 DIAGNOSIS — R531 Weakness: Secondary | ICD-10-CM

## 2016-09-20 DIAGNOSIS — J9690 Respiratory failure, unspecified, unspecified whether with hypoxia or hypercapnia: Secondary | ICD-10-CM

## 2016-09-20 DIAGNOSIS — M79609 Pain in unspecified limb: Secondary | ICD-10-CM | POA: Diagnosis not present

## 2016-09-20 HISTORY — DX: Other chronic pancreatitis: K86.1

## 2016-09-20 HISTORY — DX: Acute respiratory failure, unspecified whether with hypoxia or hypercapnia: J96.00

## 2016-09-20 HISTORY — DX: Gout, unspecified: M10.9

## 2016-09-20 HISTORY — DX: Dehydration: E86.0

## 2016-09-20 LAB — CBC WITH DIFFERENTIAL/PLATELET
Basophils Absolute: 0 10*3/uL (ref 0.0–0.1)
Basophils Relative: 0 %
EOS PCT: 6 %
Eosinophils Absolute: 0.3 10*3/uL (ref 0.0–0.7)
HEMATOCRIT: 37.9 % — AB (ref 39.0–52.0)
Hemoglobin: 12.6 g/dL — ABNORMAL LOW (ref 13.0–17.0)
Lymphocytes Relative: 38 %
Lymphs Abs: 1.7 10*3/uL (ref 0.7–4.0)
MCH: 27.5 pg (ref 26.0–34.0)
MCHC: 33.2 g/dL (ref 30.0–36.0)
MCV: 82.6 fL (ref 78.0–100.0)
MONOS PCT: 10 %
Monocytes Absolute: 0.5 10*3/uL (ref 0.1–1.0)
NEUTROS ABS: 2.1 10*3/uL (ref 1.7–7.7)
NEUTROS PCT: 46 %
PLATELETS: 246 10*3/uL (ref 150–400)
RBC: 4.59 MIL/uL (ref 4.22–5.81)
RDW: 17 % — AB (ref 11.5–15.5)
WBC: 4.5 10*3/uL (ref 4.0–10.5)

## 2016-09-20 LAB — I-STAT VENOUS BLOOD GAS, ED
Acid-Base Excess: 6 mmol/L — ABNORMAL HIGH (ref 0.0–2.0)
Bicarbonate: 30.9 mmol/L — ABNORMAL HIGH (ref 20.0–28.0)
O2 SAT: 97 %
PCO2 VEN: 45.1 mmHg (ref 44.0–60.0)
PH VEN: 7.443 — AB (ref 7.250–7.430)
TCO2: 32 mmol/L (ref 0–100)
pO2, Ven: 86 mmHg — ABNORMAL HIGH (ref 32.0–45.0)

## 2016-09-20 LAB — COMPREHENSIVE METABOLIC PANEL
ALBUMIN: 3.3 g/dL — AB (ref 3.5–5.0)
ALT: 35 U/L (ref 17–63)
AST: 38 U/L (ref 15–41)
Alkaline Phosphatase: 86 U/L (ref 38–126)
Anion gap: 13 (ref 5–15)
BUN: 12 mg/dL (ref 6–20)
CHLORIDE: 104 mmol/L (ref 101–111)
CO2: 27 mmol/L (ref 22–32)
CREATININE: 1.38 mg/dL — AB (ref 0.61–1.24)
Calcium: 8.9 mg/dL (ref 8.9–10.3)
GFR calc non Af Amer: 50 mL/min — ABNORMAL LOW (ref >60)
GFR, EST AFRICAN AMERICAN: 58 mL/min — AB (ref >60)
GLUCOSE: 114 mg/dL — AB (ref 65–99)
Potassium: 3 mmol/L — ABNORMAL LOW (ref 3.5–5.1)
SODIUM: 144 mmol/L (ref 135–145)
Total Bilirubin: 0.7 mg/dL (ref 0.3–1.2)
Total Protein: 5.5 g/dL — ABNORMAL LOW (ref 6.5–8.1)

## 2016-09-20 LAB — I-STAT CG4 LACTIC ACID, ED: Lactic Acid, Venous: 4.04 mmol/L (ref 0.5–1.9)

## 2016-09-20 LAB — ACETAMINOPHEN LEVEL: Acetaminophen (Tylenol), Serum: <10 ug/mL — ABNORMAL LOW (ref 10–30)

## 2016-09-20 LAB — I-STAT TROPONIN, ED: Troponin i, poc: 0.01 ng/mL (ref 0.00–0.08)

## 2016-09-20 LAB — AMMONIA: Ammonia: 33 umol/L (ref 9–35)

## 2016-09-20 LAB — SALICYLATE LEVEL: Salicylate Lvl: <7 mg/dL (ref 2.8–30.0)

## 2016-09-20 LAB — ETHANOL: Alcohol, Ethyl (B): 301 mg/dL (ref <5)

## 2016-09-20 MED ORDER — THIAMINE HCL 100 MG/ML IJ SOLN
100.0000 mg | Freq: Every day | INTRAMUSCULAR | Status: DC
  Administered 2016-09-21 – 2016-09-26 (×6): 100 mg via INTRAVENOUS
  Filled 2016-09-20 (×6): qty 1

## 2016-09-20 MED ORDER — HEPARIN SODIUM (PORCINE) 5000 UNIT/ML IJ SOLN
5000.0000 [IU] | Freq: Three times a day (TID) | INTRAMUSCULAR | Status: DC
  Administered 2016-09-21 – 2016-09-28 (×22): 5000 [IU] via SUBCUTANEOUS
  Filled 2016-09-20 (×24): qty 1

## 2016-09-20 MED ORDER — IOPAMIDOL (ISOVUE-300) INJECTION 61%
INTRAVENOUS | Status: AC
  Administered 2016-09-20: 100 mL
  Filled 2016-09-20: qty 100

## 2016-09-20 MED ORDER — SODIUM CHLORIDE 0.9 % IV SOLN
INTRAVENOUS | Status: DC
  Administered 2016-09-21: 75 mL/h via INTRAVENOUS

## 2016-09-20 MED ORDER — ETOMIDATE 2 MG/ML IV SOLN
INTRAVENOUS | Status: AC | PRN
  Administered 2016-09-20 (×2): 20 mg via INTRAVENOUS

## 2016-09-20 MED ORDER — PROPOFOL 1000 MG/100ML IV EMUL
0.0000 ug/kg/min | INTRAVENOUS | Status: DC
  Administered 2016-09-21: 30 ug/kg/min via INTRAVENOUS
  Administered 2016-09-21: 50 ug/kg/min via INTRAVENOUS
  Filled 2016-09-20: qty 100

## 2016-09-20 MED ORDER — PROPOFOL 1000 MG/100ML IV EMUL
INTRAVENOUS | Status: AC
  Administered 2016-09-20: 20 ug/kg/min via INTRAVENOUS
  Filled 2016-09-20: qty 100

## 2016-09-20 MED ORDER — PROPOFOL 1000 MG/100ML IV EMUL
5.0000 ug/kg/min | Freq: Once | INTRAVENOUS | Status: DC
  Administered 2016-09-20: 20 ug/kg/min via INTRAVENOUS

## 2016-09-20 MED ORDER — FENTANYL CITRATE (PF) 100 MCG/2ML IJ SOLN
50.0000 ug | INTRAMUSCULAR | Status: DC | PRN
  Administered 2016-09-21 (×2): 50 ug via INTRAVENOUS
  Filled 2016-09-20: qty 2

## 2016-09-20 MED ORDER — LABETALOL HCL 5 MG/ML IV SOLN
20.0000 mg | INTRAVENOUS | Status: DC | PRN
  Administered 2016-09-23 – 2016-09-25 (×7): 20 mg via INTRAVENOUS
  Filled 2016-09-20 (×7): qty 4

## 2016-09-20 MED ORDER — PROPOFOL 1000 MG/100ML IV EMUL
5.0000 ug/kg/min | Freq: Once | INTRAVENOUS | Status: DC
  Administered 2016-09-20: 10 ug/kg/min via INTRAVENOUS

## 2016-09-20 MED ORDER — FOLIC ACID 5 MG/ML IJ SOLN
1.0000 mg | Freq: Every day | INTRAMUSCULAR | Status: DC
  Administered 2016-09-21 – 2016-09-26 (×6): 1 mg via INTRAVENOUS
  Filled 2016-09-20 (×7): qty 0.2

## 2016-09-20 MED ORDER — HYDRALAZINE HCL 20 MG/ML IJ SOLN
10.0000 mg | INTRAMUSCULAR | Status: DC | PRN
  Administered 2016-09-22 – 2016-09-23 (×3): 20 mg via INTRAVENOUS
  Filled 2016-09-20 (×3): qty 1

## 2016-09-20 MED ORDER — POTASSIUM CHLORIDE 20 MEQ/15ML (10%) PO SOLN
40.0000 meq | Freq: Once | ORAL | Status: AC
  Administered 2016-09-21: 40 meq
  Filled 2016-09-20: qty 30

## 2016-09-20 MED ORDER — PANTOPRAZOLE SODIUM 40 MG IV SOLR
40.0000 mg | Freq: Every day | INTRAVENOUS | Status: DC
  Administered 2016-09-21 – 2016-09-24 (×5): 40 mg via INTRAVENOUS
  Filled 2016-09-20 (×5): qty 40

## 2016-09-20 MED ORDER — SODIUM CHLORIDE 0.9 % IV SOLN: 250.0000 mL | INTRAVENOUS | Status: DC | PRN

## 2016-09-20 MED ORDER — FENTANYL CITRATE (PF) 100 MCG/2ML IJ SOLN
50.0000 ug | INTRAMUSCULAR | Status: DC | PRN
  Administered 2016-09-22 (×2): 50 ug via INTRAVENOUS
  Filled 2016-09-20 (×3): qty 2

## 2016-09-20 MED ORDER — SUCCINYLCHOLINE CHLORIDE 20 MG/ML IJ SOLN
INTRAMUSCULAR | Status: AC | PRN
  Administered 2016-09-20 (×2): 120 mg via INTRAVENOUS
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
## 2016-09-20 NOTE — ED Provider Notes (Signed)
  Physical Exam  BP 117/77   Pulse 92   Temp 97.7 F (36.5 C)   Resp 16   SpO2 100%   Physical Exam  ED Course  Procedure Name: Intubation Date/Time: 09/20/2016 11:07 PM Performed by: Montine Circle Pre-anesthesia Checklist: Patient identified, Patient being monitored, Emergency Drugs available, Timeout performed and Suction available Oxygen Delivery Method: Ambu bag Preoxygenation: Pre-oxygenation with 100% oxygen Intubation Type: IV induction Ventilation: Mask ventilation without difficulty Laryngoscope Size: Glidescope and 1 Grade View: Grade I Tube type: Subglottic suction tube Tube size: 7.5 mm Number of attempts: 1 Placement Confirmation: ETT inserted through vocal cords under direct vision,  Positive ETCO2 and Breath sounds checked- equal and bilateral Secured at: 23 cm Tube secured with: ETT holder Dental Injury: Teeth and Oropharynx as per pre-operative assessment               Montine Circle, PA-C 09/20/16 2310    Macarthur Critchley, MD 09/21/16 2001

## 2016-09-20 NOTE — Code Documentation (Signed)
IV infiltrate with RSI drugs

## 2016-09-21 ENCOUNTER — Inpatient Hospital Stay (HOSPITAL_COMMUNITY): Payer: Medicare Other

## 2016-09-21 LAB — LACTIC ACID, PLASMA
LACTIC ACID, VENOUS: 4.2 mmol/L — AB (ref 0.5–1.9)
Lactic Acid, Venous: 4.5 mmol/L (ref 0.5–1.9)
Lactic Acid, Venous: 5 mmol/L (ref 0.5–1.9)

## 2016-09-21 LAB — CBC
HEMATOCRIT: 36.8 % — AB (ref 39.0–52.0)
HEMOGLOBIN: 11.8 g/dL — AB (ref 13.0–17.0)
MCH: 26.7 pg (ref 26.0–34.0)
MCHC: 32.1 g/dL (ref 30.0–36.0)
MCV: 83.3 fL (ref 78.0–100.0)
Platelets: 244 10*3/uL (ref 150–400)
RBC: 4.42 MIL/uL (ref 4.22–5.81)
RDW: 16.8 % — ABNORMAL HIGH (ref 11.5–15.5)
WBC: 6.8 10*3/uL (ref 4.0–10.5)

## 2016-09-21 LAB — GLUCOSE, CAPILLARY
GLUCOSE-CAPILLARY: 87 mg/dL (ref 65–99)
Glucose-Capillary: 126 mg/dL — ABNORMAL HIGH (ref 65–99)
Glucose-Capillary: 147 mg/dL — ABNORMAL HIGH (ref 65–99)
Glucose-Capillary: 97 mg/dL (ref 65–99)

## 2016-09-21 LAB — TROPONIN I
TROPONIN I: 0.04 ng/mL — AB (ref <0.03)
Troponin I: 0.04 ng/mL (ref <0.03)

## 2016-09-21 LAB — PHOSPHORUS
PHOSPHORUS: 3.2 mg/dL (ref 2.5–4.6)
PHOSPHORUS: 2.6 mg/dL (ref 2.5–4.6)
Phosphorus: 2.7 mg/dL (ref 2.5–4.6)

## 2016-09-21 LAB — BLOOD GAS, ARTERIAL
Acid-Base Excess: 3.8 mmol/L — ABNORMAL HIGH (ref 0.0–2.0)
Bicarbonate: 28.3 mmol/L — ABNORMAL HIGH (ref 20.0–28.0)
Drawn by: 345601
FIO2: 50
MECHVT: 540 mL
O2 Saturation: 95.5 %
PCO2 ART: 42.6 mmHg (ref 32.0–48.0)
PEEP: 5 cmH2O
PH ART: 7.428 (ref 7.350–7.450)
PO2 ART: 78.1 mmHg — AB (ref 83.0–108.0)
Patient temperature: 95.4
RATE: 16 resp/min

## 2016-09-21 LAB — BASIC METABOLIC PANEL
Anion gap: 15 (ref 5–15)
BUN: 12 mg/dL (ref 6–20)
CHLORIDE: 103 mmol/L (ref 101–111)
CO2: 27 mmol/L (ref 22–32)
CREATININE: 1.32 mg/dL — AB (ref 0.61–1.24)
Calcium: 8.4 mg/dL — ABNORMAL LOW (ref 8.9–10.3)
GFR calc Af Amer: >60 mL/min (ref >60)
GFR calc non Af Amer: 53 mL/min — ABNORMAL LOW (ref >60)
Glucose, Bld: 92 mg/dL (ref 65–99)
Potassium: 3 mmol/L — ABNORMAL LOW (ref 3.5–5.1)
Sodium: 145 mmol/L (ref 135–145)

## 2016-09-21 LAB — RAPID URINE DRUG SCREEN, HOSP PERFORMED
AMPHETAMINES: NOT DETECTED
BENZODIAZEPINES: POSITIVE — AB
Barbiturates: NOT DETECTED
COCAINE: NOT DETECTED
OPIATES: NOT DETECTED
TETRAHYDROCANNABINOL: NOT DETECTED

## 2016-09-21 LAB — MAGNESIUM
Magnesium: 2 mg/dL (ref 1.7–2.4)
Magnesium: 1.8 mg/dL (ref 1.7–2.4)

## 2016-09-21 LAB — I-STAT CG4 LACTIC ACID, ED: Lactic Acid, Venous: 4.55 mmol/L (ref 0.5–1.9)

## 2016-09-21 LAB — MRSA PCR SCREENING: MRSA BY PCR: NEGATIVE

## 2016-09-21 LAB — POTASSIUM: POTASSIUM: 3.8 mmol/L (ref 3.5–5.1)

## 2016-09-21 LAB — TRIGLYCERIDES: Triglycerides: 539 mg/dL — ABNORMAL HIGH (ref <150)

## 2016-09-21 MED ORDER — VITAL AF 1.2 CAL PO LIQD
1000.0000 mL | ORAL | Status: DC
  Administered 2016-09-21: 1000 mL

## 2016-09-21 MED ORDER — DEXMEDETOMIDINE HCL IN NACL 400 MCG/100ML IV SOLN
0.4000 ug/kg/h | INTRAVENOUS | Status: DC
  Administered 2016-09-21 – 2016-09-22 (×3): 1.2 ug/kg/h via INTRAVENOUS
  Filled 2016-09-21 (×3): qty 100

## 2016-09-21 MED ORDER — DEXTROSE-NACL 5-0.45 % IV SOLN
INTRAVENOUS | Status: DC
Start: 2016-09-21 — End: 2016-09-21

## 2016-09-21 MED ORDER — SODIUM CHLORIDE 0.9 % IV SOLN
INTRAVENOUS | Status: DC
  Administered 2016-09-21 – 2016-09-22 (×3): via INTRAVENOUS

## 2016-09-21 MED ORDER — POTASSIUM CHLORIDE 20 MEQ/15ML (10%) PO SOLN
40.0000 meq | Freq: Once | ORAL | Status: AC
  Administered 2016-09-21: 40 meq
  Filled 2016-09-21: qty 30

## 2016-09-21 MED ORDER — SODIUM CHLORIDE 0.9 % IV SOLN
0.4000 ug/kg/h | INTRAVENOUS | Status: DC
  Administered 2016-09-21 (×2): 1.2 ug/kg/h via INTRAVENOUS
  Administered 2016-09-21: 0.8 ug/kg/h via INTRAVENOUS
  Filled 2016-09-21 (×5): qty 2

## 2016-09-21 MED ORDER — CHLORHEXIDINE GLUCONATE 0.12% ORAL RINSE (MEDLINE KIT)
15.0000 mL | Freq: Two times a day (BID) | OROMUCOSAL | Status: DC
  Administered 2016-09-21 – 2016-09-23 (×4): 15 mL via OROMUCOSAL

## 2016-09-21 MED ORDER — ORAL CARE MOUTH RINSE
15.0000 mL | Freq: Four times a day (QID) | OROMUCOSAL | Status: DC
  Administered 2016-09-21 – 2016-09-23 (×6): 15 mL via OROMUCOSAL

## 2016-09-21 MED ORDER — SODIUM CHLORIDE 0.9 % IV SOLN: 0.4000 ug/kg/h | INTRAVENOUS | Status: DC

## 2016-09-21 MED ORDER — FENTANYL BOLUS VIA INFUSION
25.0000 ug | INTRAVENOUS | Status: DC | PRN
  Administered 2016-09-21 – 2016-09-22 (×4): 25 ug via INTRAVENOUS
  Filled 2016-09-21: qty 25

## 2016-09-21 MED ORDER — FENTANYL 2500MCG IN NS 250ML (10MCG/ML) PREMIX INFUSION
25.0000 ug/h | INTRAVENOUS | Status: DC
  Administered 2016-09-21: 50 ug/h via INTRAVENOUS
  Administered 2016-09-21 – 2016-09-22 (×2): 150 ug/h via INTRAVENOUS
  Filled 2016-09-21 (×3): qty 250

## 2016-09-21 MED ORDER — PROPOFOL 1000 MG/100ML IV EMUL
INTRAVENOUS | Status: AC
  Filled 2016-09-21: qty 100

## 2016-09-21 MED ORDER — FENTANYL CITRATE (PF) 100 MCG/2ML IJ SOLN
50.0000 ug | Freq: Once | INTRAMUSCULAR | Status: AC
  Administered 2016-09-21: 50 ug via INTRAVENOUS
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
## 2016-09-21 NOTE — Progress Notes (Addendum)
Initial Nutrition Assessment  DOCUMENTATION CODES:   Not applicable  INTERVENTION:    Vital AF 1.2 at 60 ml/h (1440 ml per day)   Provides 1728 kcal, 108 gm protein, 1168 ml free water daily   Monitor magnesium, potassium, and phosphorus daily for at least 3 days, MD to replete as needed, as pt is at risk for refeeding syndrome given alcohol abuse.  NUTRITION DIAGNOSIS:   Inadequate oral intake related to inability to eat as evidenced by NPO status.  GOAL:   Patient will meet greater than or equal to 90% of their needs  MONITOR:   Vent status, TF tolerance, Labs, I & O's  REASON FOR ASSESSMENT:   Consult, Rounds (verbal consult during rounds) Enteral/tube feeding initiation and management  ASSESSMENT:   71 year old male with a past medical history significant for alcohol abuse was admitted on 09/21/2016 with acute encephalopathy in the setting of alcohol withdrawal. He required intubation for airway protection.   Discussed patient in ICU rounds and with RN today. Received verbal MD Consult for TF initiation and management. Patient is currently intubated on ventilator support MV: 7 L/min Temp (24hrs), Avg:98 F (36.7 C), Min:95.4 F (35.2 C), Max:99.7 F (37.6 C)  Propofol: now off Labs reviewed: potassium 3 (L) --> 3.8 (WNL) Medications reviewed and include folic acid, thiamine.  Unable to complete Nutrition-Focused physical exam at this time. Patient becomes very agitated when his name is called or he is touched per discussion with RT.  Diet Order:  Diet NPO time specified  Skin:  Reviewed, no issues  Last BM:  unknown  Height:   Ht Readings from Last 1 Encounters:  09/21/16 6' (1.829 m)    Weight:   Wt Readings from Last 1 Encounters:  09/21/16 143 lb 11.8 oz (65.2 kg)    Ideal Body Weight:  80.9 kg  BMI:  Body mass index is 19.49 kg/m.  Estimated Nutritional Needs:   Kcal:  4982  Protein:  90-100 gm  Fluid:  1.8-2 L  EDUCATION  NEEDS:   No education needs identified at this time  Molli Barrows, [place], [place], [place] Pager [phone] After Hours Pager [phone]
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
## 2016-09-21 NOTE — Progress Notes (Signed)
eLink Physician-Brief Progress Note Patient Name: [name] DOB: [birth year] MRN: [id number]   Date of Service  09/21/2016  HPI/Events of Note  Patient with alcohol intoxication. Intubated for airway protection. Now delirious and combative.   eICU Interventions  Switched to fentanyl drip. Continue propofol drip.      Intervention Category Major Interventions: Delirium, psychosis, severe agitation - evaluation and management  [place] 09/21/2016, 4:53 AM

## 2016-09-22 LAB — GLUCOSE, CAPILLARY
GLUCOSE-CAPILLARY: 136 mg/dL — AB (ref 65–99)
GLUCOSE-CAPILLARY: 145 mg/dL — AB (ref 65–99)
GLUCOSE-CAPILLARY: 121 mg/dL — AB (ref 65–99)
Glucose-Capillary: 153 mg/dL — ABNORMAL HIGH (ref 65–99)
Glucose-Capillary: 149 mg/dL — ABNORMAL HIGH (ref 65–99)
Glucose-Capillary: 127 mg/dL — ABNORMAL HIGH (ref 65–99)

## 2016-09-22 LAB — PHOSPHORUS
PHOSPHORUS: 2.2 mg/dL — AB (ref 2.5–4.6)
Phosphorus: 2.6 mg/dL (ref 2.5–4.6)

## 2016-09-22 LAB — MAGNESIUM
MAGNESIUM: 1.9 mg/dL (ref 1.7–2.4)
MAGNESIUM: 1.9 mg/dL (ref 1.7–2.4)

## 2016-09-22 LAB — TRIGLYCERIDES: TRIGLYCERIDES: 289 mg/dL — AB (ref <150)

## 2016-09-22 MED ORDER — POTASSIUM CHLORIDE 20 MEQ/15ML (10%) PO SOLN
40.0000 meq | Freq: Once | ORAL | Status: AC
  Administered 2016-09-22: 40 meq
  Filled 2016-09-22: qty 30

## 2016-09-22 MED ORDER — LORAZEPAM 2 MG/ML IJ SOLN
1.0000 mg | Freq: Four times a day (QID) | INTRAMUSCULAR | Status: DC
  Administered 2016-09-22 – 2016-09-23 (×4): 1 mg via INTRAVENOUS
  Filled 2016-09-22 (×5): qty 1

## 2016-09-22 MED ORDER — METOPROLOL TARTRATE 5 MG/5ML IV SOLN
INTRAVENOUS | Status: AC
  Filled 2016-09-22: qty 5

## 2016-09-22 MED ORDER — HALOPERIDOL LACTATE 5 MG/ML IJ SOLN
5.0000 mg | Freq: Once | INTRAMUSCULAR | Status: AC
  Administered 2016-09-22: 5 mg via INTRAMUSCULAR

## 2016-09-22 MED ORDER — DEXMEDETOMIDINE HCL IN NACL 400 MCG/100ML IV SOLN
0.2000 ug/kg/h | INTRAVENOUS | Status: DC
  Administered 2016-09-22 – 2016-09-23 (×4): 1.2 ug/kg/h via INTRAVENOUS
  Administered 2016-09-23: 0.4 ug/kg/h via INTRAVENOUS
  Administered 2016-09-23: 1.5 ug/kg/h via INTRAVENOUS
  Administered 2016-09-24: 0.5 ug/kg/h via INTRAVENOUS
  Administered 2016-09-24 – 2016-09-25 (×4): 1 ug/kg/h via INTRAVENOUS
  Filled 2016-09-22 (×12): qty 100

## 2016-09-22 MED ORDER — LORAZEPAM 2 MG/ML IJ SOLN: 2.0000 mg | Freq: Once | INTRAMUSCULAR | Status: AC

## 2016-09-22 MED ORDER — LORAZEPAM 2 MG/ML IJ SOLN
INTRAMUSCULAR | Status: AC
  Administered 2016-09-22: 2 mg via INTRAMUSCULAR
  Filled 2016-09-22: qty 2

## 2016-09-22 MED ORDER — PROPOFOL 1000 MG/100ML IV EMUL
5.0000 ug/kg/min | INTRAVENOUS | Status: DC
  Administered 2016-09-22: 5 ug/kg/min via INTRAVENOUS
  Filled 2016-09-22: qty 100

## 2016-09-22 MED ORDER — METOPROLOL TARTRATE 5 MG/5ML IV SOLN
5.0000 mg | Freq: Three times a day (TID) | INTRAVENOUS | Status: DC
  Administered 2016-09-22 – 2016-09-28 (×19): 5 mg via INTRAVENOUS
  Filled 2016-09-22 (×19): qty 5

## 2016-09-22 NOTE — Progress Notes (Signed)
eLink Physician-Brief Progress Note Patient Name: Adonay Scheier DOB: [birth year] MRN: [id number]   Date of Service  09/22/2016  HPI/Events of Note  Agitation - Currently on a Precedex IV infusion.  eICU Interventions  Will increase the ceiling on the Precedex IV infusion ceiling to 1.5 mcg/kg/hour.        Jackie Russman [name] Copa 09/22/2016, 10:47 PM

## 2016-09-22 NOTE — Procedures (Signed)
Extubation Procedure Note  Patient Details:   Name: [name] DOB: 03/10/1946 MRN: [id number]   Airway Documentation:  + cuff leak test prior to extubation.   Evaluation  O2 sats: stable throughout Complications: No apparent complications Patient did tolerate procedure well. Bilateral Breath Sounds: Clear   Yes pt able to speak.  No distress noted, no stridor noted.  Lenna Sciara 09/22/2016, 10:36 AM

## 2016-09-22 NOTE — Progress Notes (Signed)
Wasted 2104ml of Fentanyl in the sink.  Witnessed by [employer], Therapist, sports.

## 2016-09-22 NOTE — Progress Notes (Signed)
While RN at bedside patient awoke and sat straight up in bed.  Pt then began thrashing with his arms and legs and attempted to get out of bed.  RN called for assistance and pt slid to the end of bed with his one foot on the ground.  Patient then became verbally aggressive and combative and continued to get out of bed. Several RN's assisted patient to a safe position on the ground with padding behind his head. Equipment was then used to assist the patient back to bed while reorienting the patient to his surroundings.  New orders implemented, see flowsheets.
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
## 2016-09-22 NOTE — Progress Notes (Signed)
eLink Physician-Brief Progress Note Patient Name: [name] DOB: [birth year] MRN: [id number]   Date of Service  09/22/2016  HPI/Events of Note  Severe agitation, combativeness. Possible extubation later today  eICU Interventions  Propofol infusion ordered     Intervention Category Major Interventions: Delirium, psychosis, severe agitation - evaluation and management  Wilhelmina Mcardle 09/22/2016, 6:25 AM

## 2016-09-22 NOTE — Progress Notes (Signed)
Wasted 50 mg fentanyl into sink, witnessed by nurse Janeece Fitting

## 2016-09-22 NOTE — Progress Notes (Signed)
Pt awoke very agitated and kicking foot board, foot board became disengaged from bed frame. Camera visual done by Dr. Alva Garnet, order to propofol given by md, will have RT place pt on spontaneous breathing protocol per md order

## 2016-09-23 LAB — HEPATITIS B SURFACE ANTIGEN: Hepatitis B Surface Ag: NEGATIVE

## 2016-09-23 LAB — GLUCOSE, CAPILLARY
GLUCOSE-CAPILLARY: 109 mg/dL — AB (ref 65–99)
GLUCOSE-CAPILLARY: 110 mg/dL — AB (ref 65–99)
GLUCOSE-CAPILLARY: 111 mg/dL — AB (ref 65–99)
Glucose-Capillary: 114 mg/dL — ABNORMAL HIGH (ref 65–99)
Glucose-Capillary: 122 mg/dL — ABNORMAL HIGH (ref 65–99)
Glucose-Capillary: 126 mg/dL — ABNORMAL HIGH (ref 65–99)

## 2016-09-23 LAB — CBC
HEMATOCRIT: 32.2 % — AB (ref 39.0–52.0)
HEMOGLOBIN: 10.3 g/dL — AB (ref 13.0–17.0)
MCH: 27 pg (ref 26.0–34.0)
MCHC: 32 g/dL (ref 30.0–36.0)
MCV: 84.3 fL (ref 78.0–100.0)
Platelets: 149 10*3/uL — ABNORMAL LOW (ref 150–400)
RBC: 3.82 MIL/uL — ABNORMAL LOW (ref 4.22–5.81)
RDW: 16.8 % — ABNORMAL HIGH (ref 11.5–15.5)
WBC: 7.8 10*3/uL (ref 4.0–10.5)

## 2016-09-23 LAB — BASIC METABOLIC PANEL
ANION GAP: 11 (ref 5–15)
BUN: 13 mg/dL (ref 6–20)
CO2: 24 mmol/L (ref 22–32)
Calcium: 8.6 mg/dL — ABNORMAL LOW (ref 8.9–10.3)
Chloride: 111 mmol/L (ref 101–111)
Creatinine, Ser: 1.09 mg/dL (ref 0.61–1.24)
GFR calc Af Amer: >60 mL/min (ref >60)
GLUCOSE: 112 mg/dL — AB (ref 65–99)
POTASSIUM: 3.4 mmol/L — AB (ref 3.5–5.1)
SODIUM: 146 mmol/L — AB (ref 135–145)

## 2016-09-23 LAB — HEPATITIS C ANTIBODY (REFLEX): HCV Ab: <0.1 s/co ratio (ref 0.0–0.9)

## 2016-09-23 LAB — HIV ANTIBODY (ROUTINE TESTING W REFLEX): HIV Screen 4th Generation wRfx: NONREACTIVE

## 2016-09-23 LAB — PHOSPHORUS: PHOSPHORUS: 3 mg/dL (ref 2.5–4.6)

## 2016-09-23 LAB — HCV COMMENT:

## 2016-09-23 LAB — MAGNESIUM: Magnesium: 1.7 mg/dL (ref 1.7–2.4)

## 2016-09-23 MED ORDER — MIDAZOLAM HCL 2 MG/2ML IJ SOLN
2.0000 mg | INTRAMUSCULAR | Status: DC | PRN
  Administered 2016-09-25 – 2016-09-26 (×3): 2 mg via INTRAVENOUS
  Filled 2016-09-23 (×3): qty 2

## 2016-09-23 MED ORDER — LORAZEPAM 2 MG/ML IJ SOLN
2.0000 mg | INTRAMUSCULAR | Status: DC
  Administered 2016-09-23 – 2016-09-26 (×17): 2 mg via INTRAVENOUS
  Filled 2016-09-23 (×17): qty 1

## 2016-09-23 MED ORDER — MAGNESIUM SULFATE 4 GM/100ML IV SOLN
4.0000 g | Freq: Once | INTRAVENOUS | Status: AC
  Administered 2016-09-23: 4 g via INTRAVENOUS
  Filled 2016-09-23: qty 100

## 2016-09-23 MED ORDER — POTASSIUM CHLORIDE 10 MEQ/100ML IV SOLN
10.0000 meq | INTRAVENOUS | Status: AC
  Administered 2016-09-23 (×4): 10 meq via INTRAVENOUS
  Filled 2016-09-23 (×2): qty 100

## 2016-09-23 MED ORDER — DEXTROSE-NACL 5-0.45 % IV SOLN
INTRAVENOUS | Status: DC
  Administered 2016-09-23 – 2016-09-24 (×2): via INTRAVENOUS
  Administered 2016-09-24: 100 mL/h via INTRAVENOUS
  Administered 2016-09-24 – 2016-09-26 (×3): via INTRAVENOUS

## 2016-09-23 MED ORDER — HALOPERIDOL LACTATE 5 MG/ML IJ SOLN: 5.0000 mg | Freq: Four times a day (QID) | INTRAMUSCULAR | Status: DC | PRN

## 2016-09-23 MED ORDER — CLONIDINE HCL 0.1 MG/24HR TD PTWK
0.1000 mg | MEDICATED_PATCH | TRANSDERMAL | Status: DC
  Administered 2016-09-23: 0.1 mg via TRANSDERMAL
  Filled 2016-09-23: qty 1

## 2016-09-23 NOTE — Progress Notes (Signed)
[name] Progress Note Patient Name: [name] DOB: Apr 07, 1946 MRN: [id number]   Date of Service  09/23/2016  HPI/Events of Note  Hypokalemia  eICU Interventions  Potassium replaced     Intervention Category Intermediate Interventions: Electrolyte abnormality - evaluation and management  [name] 09/23/2016, 6:07 AM

## 2016-09-23 NOTE — Progress Notes (Signed)
PULMONARY / CRITICAL CARE MEDICINE   Name: [name] MRN: [id number] DOB: October 08, 1945    ADMISSION DATE:  09/20/2016 CONSULTATION DATE:  09/21/2016  REFERRING MD:  Dr. Carolyn Stare  CHIEF COMPLAINT:  Found down  HISTORY OF PRESENT ILLNESS:   71 year old male with no known past medical history but a social history notable for alcohol abuse was found down on his back porch. EMS was called. He was brought to the emergency department was noted to be unresponsive. He was intubated. CT scanning of the abdomen and head were performed which showed no acute abnormalities. We could not perform a history because he was intubated on our arrival.  Apparently he was given haldol and versed prior to our arrival.  SUBJECTIVE:  Extubated yesterday and tolerating well from a respiratory standpoint. Still with agitation despite increasing doses of precedex and scheduled ativan.  VITAL SIGNS: BP (!) 147/94   Pulse 74   Temp 98.8 F (37.1 C) (Oral)   Resp (!) 22   Ht 6' (1.829 m)   Wt 65.7 kg (144 lb 13.5 oz)   SpO2 100%   BMI 19.64 kg/m   HEMODYNAMICS:    VENTILATOR SETTINGS:    INTAKE / OUTPUT: I/O last 3 completed shifts: In: 3303.3 [I.V.:2523.3; NG/GT:780] Out: 3900 [Urine:3900]  PHYSICAL EXAMINATION: General:  Agitated in bed with max dose precedex Neuro:  Agitated. Asking for water but wont drink.  HEENT:  [place]/AT, No JVD noted, PERRL Cardiovascular:  RRR, no MRG Lungs:  Clear unlabored Abdomen:  Soft, non-distended Musculoskeletal:  R AKA remote, otherwise intact.  Skin:  Grossly intact. MMM   LABS:  BMET  Recent Labs Lab 09/20/16 2241 09/21/16 0437 09/21/16 0907 09/23/16 0509  NA 144 145  --  146*  K 3.0* 3.0* 3.8 3.4*  CL 104 103  --  111  CO2 27 27  --  24  BUN 12 12  --  13  CREATININE 1.38* 1.32*  --  1.09  GLUCOSE 114* 92  --  112*    Electrolytes  Recent Labs Lab 09/20/16 2241 09/21/16 0437  09/22/16 0300 09/22/16 1652 09/23/16 0509  CALCIUM 8.9 8.4*   --   --   --  8.6*  MG  --  2.0  < > 1.9 1.9 1.7  PHOS  --  3.2  < > 2.6 2.2* 3.0  < > = values in this interval not displayed.  CBC  Recent Labs Lab 09/20/16 2241 09/21/16 0437 09/23/16 0509  WBC 4.5 6.8 7.8  HGB 12.6* 11.8* 10.3*  HCT 37.9* 36.8* 32.2*  PLT 246 244 149*    Coag's No results for input(s): APTT, INR in the last 168 hours.  Sepsis Markers  Recent Labs Lab 09/21/16 0156 09/21/16 0501 09/21/16 0907  LATICACIDVEN 4.55* 5.0* 4.2*    ABG  Recent Labs Lab 09/21/16 0405  PHART 7.428  PCO2ART 42.6  PO2ART 78.1*    Liver Enzymes  Recent Labs Lab 09/20/16 2241  AST 38  ALT 35  ALKPHOS 86  BILITOT 0.7  ALBUMIN 3.3*    Cardiac Enzymes  Recent Labs Lab 09/21/16 0437 09/21/16 0907  TROPONINI 0.04* 0.04*    Glucose  Recent Labs Lab 09/22/16 1224 09/22/16 1548 09/22/16 2005 09/23/16 0016 09/23/16 0403 09/23/16 0811  GLUCAP 145* 121* 127* 114* 122* 109*    Imaging No results found.   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 He required intubation for airway protection. He was be admitted to the intensive care unit for further management. He was extuabted 5/17, but course continues to be complicated by agitation/delirium and likely ETOH withdrawal. Requiring high dose precedex and ativan.   ASSESSMENT / PLAN:  PULMONARY A: Acute respiratory failure with hypoxemia secondary to alcohol intoxication  P: Extubated 5/17  IS as tolerated Oral hygeine  CARDIOVASCULAR A:  Hypertension Lactic acidosis secondary to hypoxemia AAA P:  Scheduled IV metoprolol, PRN labetalol  Continue telemetry and  hemodynamic monitoring.  Will need repeat imaging of AAA at some point.  RENAL A:   Hypokalemia P:   K repleted by [facility] Monitor BMET and UOP  GASTROINTESTINAL A:   No acute issues Hepatic steatosis, alcohol related P:   NPO PPI  HEMATOLOGIC A:   Anemia P:  Monitor for bleeding CBC  NEUROLOGIC A:   Acute encephalopathy due to alcohol intoxication (resolved) Suspect ETOH withdrawal P:   RASS goal: -1 to 0 Continue precedex Thiamine, folate PRN haldol with QTC parameters Scheduled ativan, PRN ativan  FAMILY  - Updates: Daughters not present today   Georgann Housekeeper, AGACNP-BC  Pulmonology/Critical Care Pager [phone] or [phone]  09/23/2016 8:59 AM   STAFF NOTE: Linwood Dibbles, MD FACP have personally reviewed patient's available data, including medical history, events of note, physical examination and test results as part of my evaluation. I have discussed with resident/NP and other care providers such as pharmacist, RN and RRT. In addition, I personally evaluated patient and elicited key findings of: rass -2, not fc, moves al ext equally, lungs clear, abdo soft, appearing 5/17 to have had ETH WD, delirium episode, now on higher dose precedex, not in distress, on min O2 needs, maintain thiamine, folic, Na noted, avoid hypernatremia-> change to 1/2 NS, consider addition schedule ativan increase, remains HTN , add clonidine patch, BB IV metop, may need NGt to feed, may consider jaldol scheduled if qtc wnl, k uspp, mag supp , re assess labs in am, no family in room The patient is critically ill with multiple organ systems failure and requires high complexity decision making for assessment and support, frequent evaluation and titration of therapies, application of advanced monitoring technologies and extensive interpretation of multiple databases.   Critical Care Time devoted to patient care services described in this note is 30 Minutes. This time  reflects time of care of this signee: Merrie Roof, MD FACP. This critical care time does not reflect procedure time, or teaching time or supervisory time of PA/NP/Med student/Med Resident etc but could involve care discussion time. Rest per NP/medical resident whose note is outlined above and that I agree with   Lavon Paganini. Titus Mould, MD, [place] Pgr: [facility] 09/23/2016 11:31 AM

## 2016-09-23 NOTE — Care Management Note (Signed)
Case Management Note  Patient Details  Name: Lenell Lama MRN: [id number] Date of Birth: 11-Jun-1945  Subjective/Objective:  Pt admitted post being found down with ETOH                    Action/Plan:   Pt is intubated - no family at bedside.  CSW consulted for ETOH   Expected Discharge Date:                  Expected Discharge Plan:  Home/Self Care  In-House Referral:     Discharge planning Services  CM Consult  Post Acute Care Choice:    Choice offered to:     DME Arranged:    DME Agency:     HH Arranged:    HH Agency:     Status of Service:     If discussed at [employer], dates discussed:    Additional Comments:  Maryclare Labrador, RN 09/23/2016, 11:47 AM

## 2016-09-23 NOTE — Progress Notes (Signed)
eLink Physician-Brief Progress Note Patient Name: Lamon Rotundo DOB: May 25, 1945 MRN: [id number]   Date of Service  09/23/2016  HPI/Events of Note  Agitation - Request to renew restraint orders.   eICU Interventions  Will renew restraint orders.      Intervention Category Minor Interventions: Agitation / anxiety - evaluation and management  Arelly Whittenberg Eugene 09/23/2016, 8:45 PM

## 2016-09-23 NOTE — Progress Notes (Signed)
eLink Physician-Brief Progress Note Patient Name: [name] DOB: [birth year] MRN: [id number]   Date of Service  09/23/2016  HPI/Events of Note  Agitation - Precedex IV infusion stopped at 3 PM.  eICU Interventions  Will order: 1. Restart low dose Precedex IV infusion. Titrate to RASS = 0.     Intervention Category Minor Interventions: Agitation / anxiety - evaluation and management  Lysle Dingwall 09/23/2016, 7:53 PM

## 2016-09-23 NOTE — Progress Notes (Signed)
eLink Physician-Brief Progress Note Patient Name: [name] DOB: 1945/06/14 MRN: [id number]   Date of Service  09/23/2016  HPI/Events of Note  Agitation   eICU Interventions  Will increase the ceiling on the Precedex IV infusion to 1.0 mcg/kg/hour.      Intervention Category Minor Interventions: Agitation / anxiety - evaluation and management  Lysle Dingwall 09/23/2016, 8:58 PM

## 2016-09-24 LAB — GLUCOSE, CAPILLARY
GLUCOSE-CAPILLARY: 135 mg/dL — AB (ref 65–99)
GLUCOSE-CAPILLARY: 123 mg/dL — AB (ref 65–99)
Glucose-Capillary: 119 mg/dL — ABNORMAL HIGH (ref 65–99)
Glucose-Capillary: 123 mg/dL — ABNORMAL HIGH (ref 65–99)
Glucose-Capillary: 126 mg/dL — ABNORMAL HIGH (ref 65–99)
Glucose-Capillary: 128 mg/dL — ABNORMAL HIGH (ref 65–99)

## 2016-09-24 LAB — MAGNESIUM: Magnesium: 1.8 mg/dL (ref 1.7–2.4)

## 2016-09-24 LAB — BASIC METABOLIC PANEL
ANION GAP: 6 (ref 5–15)
BUN: 8 mg/dL (ref 6–20)
CHLORIDE: 110 mmol/L (ref 101–111)
CO2: 24 mmol/L (ref 22–32)
CREATININE: 0.95 mg/dL (ref 0.61–1.24)
Calcium: 8.3 mg/dL — ABNORMAL LOW (ref 8.9–10.3)
GFR calc non Af Amer: >60 mL/min (ref >60)
GLUCOSE: 126 mg/dL — AB (ref 65–99)
Potassium: 3 mmol/L — ABNORMAL LOW (ref 3.5–5.1)
Sodium: 140 mmol/L (ref 135–145)

## 2016-09-24 LAB — PHOSPHORUS: Phosphorus: 2.9 mg/dL (ref 2.5–4.6)

## 2016-09-24 MED ORDER — HALOPERIDOL LACTATE 5 MG/ML IJ SOLN
5.0000 mg | INTRAMUSCULAR | Status: DC
  Administered 2016-09-24 – 2016-09-26 (×11): 5 mg via INTRAVENOUS
  Filled 2016-09-24 (×16): qty 1

## 2016-09-24 MED ORDER — MAGNESIUM SULFATE 4 GM/100ML IV SOLN
4.0000 g | Freq: Once | INTRAVENOUS | Status: DC
  Filled 2016-09-24: qty 100

## 2016-09-24 MED ORDER — CLONIDINE HCL 0.3 MG/24HR TD PTWK: 0.3000 mg | MEDICATED_PATCH | TRANSDERMAL | Status: DC

## 2016-09-24 MED ORDER — POTASSIUM CHLORIDE 10 MEQ/100ML IV SOLN: 10.0000 meq | INTRAVENOUS | Status: DC

## 2016-09-24 MED ORDER — CLONIDINE HCL 0.3 MG/24HR TD PTWK
0.3000 mg | MEDICATED_PATCH | TRANSDERMAL | Status: DC
  Administered 2016-09-24 – 2016-10-01 (×2): 0.3 mg via TRANSDERMAL
  Filled 2016-09-24 (×2): qty 1

## 2016-09-24 MED ORDER — HYDRALAZINE HCL 20 MG/ML IJ SOLN
10.0000 mg | INTRAMUSCULAR | Status: DC | PRN
  Administered 2016-09-24 – 2016-09-27 (×10): 20 mg via INTRAVENOUS
  Administered 2016-09-28: 40 mg via INTRAVENOUS
  Administered 2016-09-28 (×2): 20 mg via INTRAVENOUS
  Administered 2016-09-29 (×2): 40 mg via INTRAVENOUS
  Administered 2016-10-01: 10 mg via INTRAVENOUS
  Administered 2016-10-01: 20 mg via INTRAVENOUS
  Administered 2016-10-02: 10 mg via INTRAVENOUS
  Filled 2016-09-24 (×2): qty 1
  Filled 2016-09-24 (×2): qty 2
  Filled 2016-09-24 (×2): qty 1
  Filled 2016-09-24: qty 2
  Filled 2016-09-24 (×9): qty 1
  Filled 2016-09-24: qty 2
  Filled 2016-09-24: qty 1
  Filled 2016-09-24: qty 2
  Filled 2016-09-24 (×2): qty 1

## 2016-09-24 NOTE — Progress Notes (Signed)
PULMONARY / CRITICAL CARE MEDICINE   Name: [name] MRN: [id number] DOB: Nov 04, 1945    ADMISSION DATE:  09/20/2016 CONSULTATION DATE:  09/21/2016  REFERRING MD:  Dr. Carolyn Stare  CHIEF COMPLAINT:  Found down  HISTORY OF PRESENT ILLNESS:   71 year old male with no known past medical history but a social history notable for alcohol abuse was found down on his back porch. EMS was called. He was brought to the emergency department was noted to be unresponsive. He was intubated. CT scanning of the abdomen and head were performed which showed no acute abnormalities. We could not perform a history because he was intubated on our arrival.  Apparently he was given haldol and versed prior to our arrival.  SUBJECTIVE:  Continued with agitation, precedex to 1  VITAL SIGNS: BP (!) 182/94   Pulse 73   Temp 99 F (37.2 C) (Oral)   Resp (!) 28   Ht 6' (1.829 m)   Wt 67.2 kg (148 lb 2.4 oz)   SpO2 100%   BMI 20.09 kg/m   HEMODYNAMICS:    VENTILATOR SETTINGS:    INTAKE / OUTPUT: I/O last 3 completed shifts: In: 3115.3 [I.V.:2715.3; IV Piggyback:400] Out: 0630 [Urine:3560]  PHYSICAL EXAMINATION: General:  Agitated in bed mild-int Neuro:  Agitation improved, moves all ext   HEENT:  perrl neck supple Cardiovascular:  s1 s2 RRR, no MRG Lungs:  Clear, slight distant Abdomen:  Soft, non-distended, no r Musculoskeletal:  R AKA remote, otherwise intact.  Skin:  No rash   LABS:  BMET  Recent Labs Lab 09/20/16 2241 09/21/16 0437 09/21/16 0907 09/23/16 0509  NA 144 145  --  146*  K 3.0* 3.0* 3.8 3.4*  CL 104 103  --  111  CO2 27 27  --  24  BUN 12 12  --  13  CREATININE 1.38* 1.32*  --  1.09  GLUCOSE 114* 92  --  112*    Electrolytes  Recent Labs Lab 09/20/16 2241 09/21/16 0437  09/22/16 0300 09/22/16 1652 09/23/16 0509  CALCIUM 8.9 8.4*  --   --   --  8.6*  MG  --  2.0  < > 1.9 1.9 1.7  PHOS  --  3.2  < > 2.6 2.2* 3.0  < > = values in this interval not  displayed.  CBC  Recent Labs Lab 09/20/16 2241 09/21/16 0437 09/23/16 0509  WBC 4.5 6.8 7.8  HGB 12.6* 11.8* 10.3*  HCT 37.9* 36.8* 32.2*  PLT 246 244 149*    Coag's No results for input(s): APTT, INR in the last 168 hours.  Sepsis Markers  Recent Labs Lab 09/21/16 0156 09/21/16 0501 09/21/16 0907  LATICACIDVEN 4.55* 5.0* 4.2*    ABG  Recent Labs Lab 09/21/16 0405  PHART 7.428  PCO2ART 42.6  PO2ART 78.1*    Liver Enzymes  Recent Labs Lab 09/20/16 2241  AST 38  ALT 35  ALKPHOS 86  BILITOT 0.7  ALBUMIN 3.3*    Cardiac Enzymes  Recent Labs Lab 09/21/16 0437 09/21/16 0907  TROPONINI 0.04* 0.04*    Glucose  Recent Labs Lab 09/23/16 1148 09/23/16 1522 09/23/16 2111 09/24/16 0026 09/24/16 0341 09/24/16 0847  GLUCAP 110* 111* 126* 123* 135* 126*    Imaging No results found.   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 He required intubation for airway protection. He was be admitted to the intensive care unit for further management. He was extuabted 5/17, but course continues to be complicated by agitation/delirium and likely ETOH withdrawal. Requiring high dose precedex and ativan.   ASSESSMENT / PLAN:  PULMONARY A: Acute respiratory failure with hypoxemia secondary to alcohol intoxication  P: IS when able upright  CARDIOVASCULAR A:  Hypertension Lactic acidosis secondary to hypoxemia AAA P:  Scheduled IV metoprolol, PRN labetalol needed to control When able add oral BB Increase patch clonidine Continue telemetry Will need repeat imaging of AAA at some point. qtc daily  , haldol  RENAL A:    Hypokalemia, hypomag P:   K , mag supp Monitor BMET in am  On haldol  GASTROINTESTINAL A:   No acute issues Hepatic steatosis, alcohol related P:   Attempt full liq PPI  HEMATOLOGIC A:   Anemia P:  CBC int  NEUROLOGIC A:   Acute encephalopathy due to alcohol intoxication (resolved) Suspect ETOH withdrawal P:   RASS goal: -1 to 0 Continue precedex Thiamine, folate Clonidine patch increase BB Haldol to schedule when qtc less 550 Ativan may need increase   FAMILY  - Updates: Daughters not present today  Ccm time 30 min   Lavon Paganini. Titus Mould, MD, [place] Pgr: [facility] 09/24/2016 11:13 AM

## 2016-09-24 NOTE — Progress Notes (Signed)
eLink Physician-Brief Progress Note Patient Name: Enos Muhl DOB: 06/20/1945 MRN: [id number]   Date of Service  09/24/2016  HPI/Events of Note  Agitation - Request to renew restraint order.  eICU Interventions  Will renew restraint order.      Intervention Category Minor Interventions: Agitation / anxiety - evaluation and management  Shakeya Kerkman [place] 09/24/2016, 9:30 PM

## 2016-09-25 ENCOUNTER — Inpatient Hospital Stay (HOSPITAL_COMMUNITY): Payer: Medicare Other

## 2016-09-25 DIAGNOSIS — R0603 Acute respiratory distress: Secondary | ICD-10-CM | POA: Diagnosis present

## 2016-09-25 LAB — MAGNESIUM: MAGNESIUM: 1.6 mg/dL — AB (ref 1.7–2.4)

## 2016-09-25 LAB — GLUCOSE, CAPILLARY
GLUCOSE-CAPILLARY: 111 mg/dL — AB (ref 65–99)
GLUCOSE-CAPILLARY: 100 mg/dL — AB (ref 65–99)
Glucose-Capillary: 110 mg/dL — ABNORMAL HIGH (ref 65–99)
Glucose-Capillary: 111 mg/dL — ABNORMAL HIGH (ref 65–99)
Glucose-Capillary: 126 mg/dL — ABNORMAL HIGH (ref 65–99)
Glucose-Capillary: 99 mg/dL (ref 65–99)

## 2016-09-25 LAB — CBC
HCT: 30.5 % — ABNORMAL LOW (ref 39.0–52.0)
HEMOGLOBIN: 9.8 g/dL — AB (ref 13.0–17.0)
MCH: 26.7 pg (ref 26.0–34.0)
MCHC: 32.1 g/dL (ref 30.0–36.0)
MCV: 83.1 fL (ref 78.0–100.0)
PLATELETS: 163 10*3/uL (ref 150–400)
RBC: 3.67 MIL/uL — AB (ref 4.22–5.81)
RDW: 16.5 % — ABNORMAL HIGH (ref 11.5–15.5)
WBC: 11.8 10*3/uL — AB (ref 4.0–10.5)

## 2016-09-25 LAB — BLOOD GAS, ARTERIAL
ACID-BASE DEFICIT: 2.3 mmol/L — AB (ref 0.0–2.0)
BICARBONATE: 20.6 mmol/L (ref 20.0–28.0)
DRAWN BY: 43707
O2 CONTENT: 2 L/min
O2 SAT: 96.3 %
PCO2 ART: 30.4 mmHg — AB (ref 32.0–48.0)
Patient temperature: 102.8
pH, Arterial: 7.457 — ABNORMAL HIGH (ref 7.350–7.450)
pO2, Arterial: 89.8 mmHg (ref 83.0–108.0)

## 2016-09-25 LAB — BASIC METABOLIC PANEL
Anion gap: 10 (ref 5–15)
BUN: 8 mg/dL (ref 6–20)
CHLORIDE: 109 mmol/L (ref 101–111)
CO2: 21 mmol/L — ABNORMAL LOW (ref 22–32)
CREATININE: 1.11 mg/dL (ref 0.61–1.24)
Calcium: 8.3 mg/dL — ABNORMAL LOW (ref 8.9–10.3)
GFR calc non Af Amer: >60 mL/min (ref >60)
Glucose, Bld: 122 mg/dL — ABNORMAL HIGH (ref 65–99)
Potassium: 3.3 mmol/L — ABNORMAL LOW (ref 3.5–5.1)
SODIUM: 140 mmol/L (ref 135–145)

## 2016-09-25 LAB — PHOSPHORUS: PHOSPHORUS: 2.9 mg/dL (ref 2.5–4.6)

## 2016-09-25 MED ORDER — POTASSIUM CHLORIDE 10 MEQ/100ML IV SOLN
INTRAVENOUS | Status: AC
  Filled 2016-09-25: qty 100

## 2016-09-25 MED ORDER — ACETAMINOPHEN 650 MG RE SUPP
650.0000 mg | Freq: Four times a day (QID) | RECTAL | Status: DC | PRN
  Administered 2016-09-25 – 2016-09-26 (×2): 650 mg via RECTAL
  Filled 2016-09-25 (×3): qty 1

## 2016-09-25 MED ORDER — CHLORHEXIDINE GLUCONATE 0.12 % MT SOLN
15.0000 mL | Freq: Two times a day (BID) | OROMUCOSAL | Status: DC
  Administered 2016-09-25 – 2016-10-07 (×21): 15 mL via OROMUCOSAL
  Filled 2016-09-25 (×14): qty 15

## 2016-09-25 MED ORDER — VANCOMYCIN HCL 500 MG IV SOLR
500.0000 mg | Freq: Two times a day (BID) | INTRAVENOUS | Status: DC
  Administered 2016-09-26 – 2016-09-27 (×4): 500 mg via INTRAVENOUS
  Filled 2016-09-25 (×6): qty 500

## 2016-09-25 MED ORDER — ORAL CARE MOUTH RINSE
15.0000 mL | Freq: Two times a day (BID) | OROMUCOSAL | Status: DC
  Administered 2016-09-26 – 2016-10-07 (×17): 15 mL via OROMUCOSAL

## 2016-09-25 MED ORDER — PANTOPRAZOLE SODIUM 40 MG PO PACK
40.0000 mg | PACK | Freq: Every day | ORAL | Status: DC
  Filled 2016-09-25: qty 20

## 2016-09-25 MED ORDER — PIPERACILLIN-TAZOBACTAM 3.375 G IVPB
3.3750 g | Freq: Three times a day (TID) | INTRAVENOUS | Status: DC
  Administered 2016-09-25 – 2016-09-28 (×8): 3.375 g via INTRAVENOUS
  Filled 2016-09-25 (×10): qty 50

## 2016-09-25 MED ORDER — VANCOMYCIN HCL IN DEXTROSE 1-5 GM/200ML-% IV SOLN
1000.0000 mg | Freq: Once | INTRAVENOUS | Status: AC
  Administered 2016-09-25: 1000 mg via INTRAVENOUS
  Filled 2016-09-25: qty 200

## 2016-09-25 MED ORDER — MAGNESIUM SULFATE 4 GM/100ML IV SOLN
4.0000 g | Freq: Once | INTRAVENOUS | Status: AC
  Administered 2016-09-25: 4 g via INTRAVENOUS
  Filled 2016-09-25: qty 100

## 2016-09-25 MED ORDER — POTASSIUM CHLORIDE 10 MEQ/100ML IV SOLN
10.0000 meq | INTRAVENOUS | Status: AC
  Administered 2016-09-25 (×4): 10 meq via INTRAVENOUS
  Filled 2016-09-25 (×3): qty 100

## 2016-09-25 MED ORDER — LABETALOL HCL 5 MG/ML IV SOLN
20.0000 mg | INTRAVENOUS | Status: DC | PRN
  Administered 2016-09-25 – 2016-09-29 (×11): 20 mg via INTRAVENOUS
  Filled 2016-09-25 (×12): qty 4

## 2016-09-25 MED ORDER — ALBUTEROL SULFATE (2.5 MG/3ML) 0.083% IN NEBU
2.5000 mg | INHALATION_SOLUTION | RESPIRATORY_TRACT | Status: DC
  Administered 2016-09-25 – 2016-09-26 (×3): 2.5 mg via RESPIRATORY_TRACT
  Filled 2016-09-25 (×4): qty 3

## 2016-09-25 NOTE — Progress Notes (Signed)
Attempted to place NG tube with Cleda Clarks, RN.  Unsuccessful x 2 tries.  Upon second time, patient vomited the tube back up.  Moderate bloody secretions on the tube.  Dr. Vaughan Browner made aware.  Verbal order to d/c NG and place Cortrak.

## 2016-09-25 NOTE — Progress Notes (Signed)
RN notified of pt temp of 100.4 F orally

## 2016-09-25 NOTE — Progress Notes (Signed)
eLink Physician-Brief Progress Note Patient Name: [name] DOB: June 14, 1945 MRN: [id number]   Date of Service  09/25/2016  HPI/Events of Note  Fever to 102.8 F. RR = 39 and HR = 124 (Sinus Tachycardia).  eICU Interventions  Will order: 1. Portable CXR STAT. 2. ABG STAT. 3. Albuterol 2.5 mg via neb Q 4 hours.  4. Will renew restraint order. 5. Blood cultures X 2. 6. Urine culture now. 7. Sputum for Gram stain and culture. 8. Vancomycin and Zosyn per pharmacy consult.  9. Tylenol suppository 650 mg PR Q 6 hours PRN Temp > 101.0 F.      Intervention Category Major Interventions: Infection - evaluation and management Intermediate Interventions: Respiratory distress - evaluation and management  Sommer,Steven Eugene 09/25/2016, 8:13 PM

## 2016-09-25 NOTE — Progress Notes (Signed)
Pharmacy Antibiotic Note  [name] is a 71 y.o. male admitted on 09/20/2016 with sepsis.  Pharmacy has been consulted for vancomycin and zosyn dosing.  Will load patient with vancomycin 1g IV once.  Plan: Vancomycin 500mg  IV every 12 hours.  Goal trough 15-20 mcg/mL. Zosyn 3.375g IV q8h (4 hour infusion).  Monitor culture data, renal function and clinical course VT at [facility] prn  Height: 6' (182.9 cm) Weight: 145 lb 1 oz (65.8 kg) IBW/kg (Calculated) : 77.6  Temp (24hrs), Avg:99.7 F (37.6 C), Min:98.3 F (36.8 C), Max:102.8 F (39.3 C)   Recent Labs Lab 09/20/16 2241 09/20/16 2249 09/21/16 0155 09/21/16 0156 09/21/16 0437 09/21/16 0501 09/21/16 0907 09/23/16 0509 09/24/16 1303 09/25/16 0335 09/25/16 1425  WBC 4.5  --   --   --  6.8  --   --  7.8  --   --  11.8*  CREATININE 1.38*  --   --   --  1.32*  --   --  1.09 0.95 1.11  --   LATICACIDVEN  --  4.04* 4.5* 4.55*  --  5.0* 4.2*  --   --   --   --     Estimated Creatinine Clearance: 56.8 mL/min (by C-G formula based on SCr of 1.11 mg/dL).    No Known Allergies  Antimicrobials this admission: Vanc 5/20 >>  Zosyn 5/20 >>   Andrey Cota. Diona Foley, PharmD, BCPS Clinical Pharmacist [phone] 09/25/2016 8:13 PM

## 2016-09-25 NOTE — Progress Notes (Signed)
PULMONARY / CRITICAL CARE MEDICINE   Name: [name] MRN: [id number] DOB: 1945/12/30    ADMISSION DATE:  09/20/2016 CONSULTATION DATE:  09/21/2016  REFERRING MD:  Dr. Carolyn Stare  CHIEF COMPLAINT:  Found down  HISTORY OF PRESENT ILLNESS:   71 year old male with no known past medical history but a social history notable for alcohol abuse was found down on his back porch. EMS was called. He was brought to the emergency department was noted to be unresponsive. He was intubated. CT scanning of the abdomen and head were performed which showed no acute abnormalities. We could not perform a history because he was intubated on our arrival.  Apparently he was given haldol and versed prior to our arrival.  SUBJECTIVE:  Scheduled haldol was added To scheduled ativan precedex was NOT attempted to be reduced Too lethargic to eat  VITAL SIGNS: BP (!) 177/91 (BP Location: Right Arm)   Pulse 79   Temp 98.3 F (36.8 C) (Oral)   Resp (!) 31   Ht 6' (1.829 m)   Wt 65.8 kg (145 lb 1 oz)   SpO2 100%   BMI 19.67 kg/m   HEMODYNAMICS:    VENTILATOR SETTINGS:    INTAKE / OUTPUT: I/O last 3 completed shifts: In: 3966.1 [I.V.:3966.1] Out: 2690 [Urine:2690]  PHYSICAL EXAMINATION: General: rass -2, does awaken Neuro: moves all etc, perr, said his name HEENT: jvd wnl PULM: CTA , slight coarse cough CV: s1 s2 RRR not tachy GI: soft, bs wnl no ascites shifts Extremities: no edema   LABS:  BMET  Recent Labs Lab 09/23/16 0509 09/24/16 1303 09/25/16 0335  NA 146* 140 140  K 3.4* 3.0* 3.3*  CL 111 110 109  CO2 24 24 21*  BUN 13 8 8   CREATININE 1.09 0.95 1.11  GLUCOSE 112* 126* 122*    Electrolytes  Recent Labs Lab 09/23/16 0509 09/24/16 1303 09/25/16 0335  CALCIUM 8.6* 8.3* 8.3*  MG 1.7 1.8 1.6*  PHOS 3.0 2.9 2.9    CBC  Recent Labs Lab 09/20/16 2241 09/21/16 0437 09/23/16 0509  WBC 4.5 6.8 7.8  HGB 12.6* 11.8* 10.3*  HCT 37.9* 36.8* 32.2*  PLT 246 244 149*     Coag's No results for input(s): APTT, INR in the last 168 hours.  Sepsis Markers  Recent Labs Lab 09/21/16 0156 09/21/16 0501 09/21/16 0907  LATICACIDVEN 4.55* 5.0* 4.2*    ABG  Recent Labs Lab 09/21/16 0405  PHART 7.428  PCO2ART 42.6  PO2ART 78.1*    Liver Enzymes  Recent Labs Lab 09/20/16 2241  AST 38  ALT 35  ALKPHOS 86  BILITOT 0.7  ALBUMIN 3.3*    Cardiac Enzymes  Recent Labs Lab 09/21/16 0437 09/21/16 0907  TROPONINI 0.04* 0.04*    Glucose  Recent Labs Lab 09/24/16 1215 09/24/16 1650 09/24/16 2033 09/25/16 0012 09/25/16 0351 09/25/16 0911  GLUCAP 119* 123* 128* 110* 111* 111*    Imaging No results found.   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 He required intubation for airway protection. He was be admitted to the intensive care unit for further management. He was extuabted 5/17, but course continues to be complicated by agitation/delirium and likely ETOH withdrawal. Requiring  high dose precedex and ativan.   ASSESSMENT / PLAN:  PULMONARY A: Acute respiratory failure with hypoxemia secondary to alcohol intoxication  P: IS when able Upright to a chair after precedex reduced  CARDIOVASCULAR A:  Hypertension better controlled Lactic acidosis secondary to hypoxemia AAA At risk QTC prolongation P:  Scheduled IV metoprolol, PRN labetalol needed to control When able add oral BB- unable to day, but may add NGT for feeds then could add Patch clonidine, maintain  Continue telemetry Will need repeat imaging of AAA at some point. qtc daily  , haldol  RENAL A:    Hypokalemia P:   K  supp Monitor BMET in am  On haldol, so re assess k, mag again in am   GASTROINTESTINAL A:   NPO Hepatic steatosis, alcohol related P:   Attempt full liq- wasen't able, re attempt off precedex, if then not able, place NGT today and feed PPI Repeat LF in am   HEMATOLOGIC A:   Anemia dvt prevention P:  CBC int, in am  scd on left leg Liver dz, get coags  NEUROLOGIC A:   Acute encephalopathy due to alcohol intoxication (resolved) Suspect ETOH withdrawal P:   RASS goal: 0 Continue precedex but wean to off!!!! Thiamine, folate Clonidine patch increase 0.3, maintain BB add oral if able today, keep labetolol Haldol to schedule when qtc less 550 Ativan schedule to remain If unable to dc precedex, double haldol, as qtc allows   FAMILY  - Updates: Daughters not present today  Ccm time 30 min   Lavon Paganini. Titus Mould, MD, [place] Pgr: [place] [facility] 09/25/2016 9:58 AM

## 2016-09-26 DIAGNOSIS — G9341 Metabolic encephalopathy: Secondary | ICD-10-CM

## 2016-09-26 LAB — CBC WITH DIFFERENTIAL/PLATELET
BASOS ABS: 0 10*3/uL (ref 0.0–0.1)
BASOS PCT: 0 %
Eosinophils Absolute: 0 10*3/uL (ref 0.0–0.7)
Eosinophils Relative: 0 %
HEMATOCRIT: 30 % — AB (ref 39.0–52.0)
HEMOGLOBIN: 9.8 g/dL — AB (ref 13.0–17.0)
Lymphocytes Relative: 5 %
Lymphs Abs: 0.8 10*3/uL (ref 0.7–4.0)
MCH: 27 pg (ref 26.0–34.0)
MCHC: 32.7 g/dL (ref 30.0–36.0)
MCV: 82.6 fL (ref 78.0–100.0)
MONOS PCT: 15 %
Monocytes Absolute: 2.3 10*3/uL — ABNORMAL HIGH (ref 0.1–1.0)
NEUTROS ABS: 12.5 10*3/uL — AB (ref 1.7–7.7)
NEUTROS PCT: 80 %
Platelets: 142 10*3/uL — ABNORMAL LOW (ref 150–400)
RBC: 3.63 MIL/uL — ABNORMAL LOW (ref 4.22–5.81)
RDW: 16.6 % — ABNORMAL HIGH (ref 11.5–15.5)
WBC: 15.6 10*3/uL — ABNORMAL HIGH (ref 4.0–10.5)

## 2016-09-26 LAB — BLOOD GAS, ARTERIAL
ACID-BASE DEFICIT: 2.7 mmol/L — AB (ref 0.0–2.0)
BICARBONATE: 20.8 mmol/L (ref 20.0–28.0)
DRAWN BY: 252031
Delivery systems: POSITIVE
Expiratory PAP: 8
FIO2: 40
INSPIRATORY PAP: 15
O2 SAT: 99 %
PO2 ART: 155 mmHg — AB (ref 83.0–108.0)
Patient temperature: 98.6
pCO2 arterial: 31 mmHg — ABNORMAL LOW (ref 32.0–48.0)
pH, Arterial: 7.441 (ref 7.350–7.450)

## 2016-09-26 LAB — BLOOD CULTURE ID PANEL (REFLEXED)
Acinetobacter baumannii: NOT DETECTED
CANDIDA ALBICANS: NOT DETECTED
CANDIDA TROPICALIS: NOT DETECTED
Candida glabrata: NOT DETECTED
Candida krusei: NOT DETECTED
Candida parapsilosis: NOT DETECTED
ENTEROBACTERIACEAE SPECIES: NOT DETECTED
Enterobacter cloacae complex: NOT DETECTED
Enterococcus species: NOT DETECTED
Escherichia coli: NOT DETECTED
HAEMOPHILUS INFLUENZAE: NOT DETECTED
KLEBSIELLA PNEUMONIAE: NOT DETECTED
Klebsiella oxytoca: NOT DETECTED
Listeria monocytogenes: NOT DETECTED
METHICILLIN RESISTANCE: NOT DETECTED
NEISSERIA MENINGITIDIS: NOT DETECTED
PSEUDOMONAS AERUGINOSA: NOT DETECTED
Proteus species: NOT DETECTED
STREPTOCOCCUS PYOGENES: NOT DETECTED
STREPTOCOCCUS SPECIES: NOT DETECTED
Serratia marcescens: NOT DETECTED
Staphylococcus aureus (BCID): NOT DETECTED
Staphylococcus species: DETECTED — AB
Streptococcus agalactiae: NOT DETECTED
Streptococcus pneumoniae: NOT DETECTED

## 2016-09-26 LAB — PROTIME-INR
INR: 1.25
Prothrombin Time: 15.7 seconds — ABNORMAL HIGH (ref 11.4–15.2)

## 2016-09-26 LAB — GLUCOSE, CAPILLARY
GLUCOSE-CAPILLARY: 129 mg/dL — AB (ref 65–99)
GLUCOSE-CAPILLARY: 140 mg/dL — AB (ref 65–99)
Glucose-Capillary: 102 mg/dL — ABNORMAL HIGH (ref 65–99)
Glucose-Capillary: 105 mg/dL — ABNORMAL HIGH (ref 65–99)
Glucose-Capillary: 116 mg/dL — ABNORMAL HIGH (ref 65–99)
Glucose-Capillary: 123 mg/dL — ABNORMAL HIGH (ref 65–99)
Glucose-Capillary: 126 mg/dL — ABNORMAL HIGH (ref 65–99)

## 2016-09-26 LAB — BASIC METABOLIC PANEL
Anion gap: 10 (ref 5–15)
BUN: 10 mg/dL (ref 6–20)
CHLORIDE: 111 mmol/L (ref 101–111)
CO2: 19 mmol/L — ABNORMAL LOW (ref 22–32)
CREATININE: 1.2 mg/dL (ref 0.61–1.24)
Calcium: 8.3 mg/dL — ABNORMAL LOW (ref 8.9–10.3)
GFR calc non Af Amer: 59 mL/min — ABNORMAL LOW (ref >60)
Glucose, Bld: 109 mg/dL — ABNORMAL HIGH (ref 65–99)
POTASSIUM: 3.2 mmol/L — AB (ref 3.5–5.1)
SODIUM: 140 mmol/L (ref 135–145)

## 2016-09-26 LAB — PHOSPHORUS: PHOSPHORUS: 2.1 mg/dL — AB (ref 2.5–4.6)

## 2016-09-26 LAB — HEPATIC FUNCTION PANEL
ALT: 22 U/L (ref 17–63)
AST: 32 U/L (ref 15–41)
Albumin: 2.4 g/dL — ABNORMAL LOW (ref 3.5–5.0)
Alkaline Phosphatase: 72 U/L (ref 38–126)
Bilirubin, Direct: 0.4 mg/dL (ref 0.1–0.5)
Indirect Bilirubin: 1.2 mg/dL — ABNORMAL HIGH (ref 0.3–0.9)
Total Bilirubin: 1.6 mg/dL — ABNORMAL HIGH (ref 0.3–1.2)
Total Protein: 5.2 g/dL — ABNORMAL LOW (ref 6.5–8.1)

## 2016-09-26 LAB — APTT: aPTT: 47 seconds — ABNORMAL HIGH (ref 24–36)

## 2016-09-26 LAB — MAGNESIUM: MAGNESIUM: 2 mg/dL (ref 1.7–2.4)

## 2016-09-26 MED ORDER — FOLIC ACID 1 MG PO TABS: 1.0000 mg | ORAL_TABLET | Freq: Every day | ORAL | Status: DC

## 2016-09-26 MED ORDER — VITAMIN B-1 100 MG PO TABS
100.0000 mg | ORAL_TABLET | Freq: Every day | ORAL | Status: DC
  Administered 2016-09-27 – 2016-09-28 (×2): 100 mg via ORAL
  Filled 2016-09-26 (×2): qty 1

## 2016-09-26 MED ORDER — SODIUM PHOSPHATES 45 MMOLE/15ML IV SOLN
10.0000 mmol | Freq: Once | INTRAVENOUS | Status: AC
  Administered 2016-09-26: 10 mmol via INTRAVENOUS
  Filled 2016-09-26: qty 3.33

## 2016-09-26 MED ORDER — ACETAMINOPHEN 160 MG/5ML PO SOLN
650.0000 mg | Freq: Four times a day (QID) | ORAL | Status: DC | PRN
  Administered 2016-09-26 – 2016-10-01 (×10): 650 mg
  Filled 2016-09-26 (×10): qty 20.3

## 2016-09-26 MED ORDER — VITAMIN B-1 100 MG PO TABS: 100.0000 mg | ORAL_TABLET | Freq: Every day | ORAL | Status: DC

## 2016-09-26 MED ORDER — FOLIC ACID 1 MG PO TABS
1.0000 mg | ORAL_TABLET | Freq: Every day | ORAL | Status: DC
  Administered 2016-09-27 – 2016-09-28 (×2): 1 mg via ORAL
  Filled 2016-09-26 (×2): qty 1

## 2016-09-26 MED ORDER — JEVITY 1.2 CAL PO LIQD
1000.0000 mL | ORAL | Status: DC
  Administered 2016-09-26: 16:00:00
  Administered 2016-09-28 – 2016-09-30 (×4): 1000 mL
  Filled 2016-09-26 (×7): qty 1000

## 2016-09-26 MED ORDER — POTASSIUM CHLORIDE 10 MEQ/100ML IV SOLN
10.0000 meq | INTRAVENOUS | Status: AC
  Administered 2016-09-26 (×4): 10 meq via INTRAVENOUS
  Filled 2016-09-26 (×4): qty 100

## 2016-09-26 MED ORDER — LORAZEPAM 2 MG/ML IJ SOLN
1.0000 mg | INTRAMUSCULAR | Status: DC | PRN
  Administered 2016-09-26 – 2016-09-27 (×4): 2 mg via INTRAVENOUS
  Administered 2016-09-28: 1 mg via INTRAVENOUS
  Administered 2016-09-29 – 2016-09-30 (×4): 2 mg via INTRAVENOUS
  Filled 2016-09-26 (×11): qty 1

## 2016-09-26 MED ORDER — IPRATROPIUM-ALBUTEROL 0.5-2.5 (3) MG/3ML IN SOLN
3.0000 mL | RESPIRATORY_TRACT | Status: DC | PRN
  Filled 2016-09-26: qty 3

## 2016-09-26 MED ORDER — HALOPERIDOL LACTATE 5 MG/ML IJ SOLN
5.0000 mg | Freq: Four times a day (QID) | INTRAMUSCULAR | Status: DC | PRN
  Administered 2016-09-26 – 2016-09-27 (×2): 5 mg via INTRAVENOUS
  Filled 2016-09-26 (×2): qty 1

## 2016-09-26 NOTE — Progress Notes (Signed)
[facility] ADULT ICU REPLACEMENT PROTOCOL FOR AM LAB REPLACEMENT ONLY  The patient does apply for the [facility] Adult ICU Electrolyte Replacment Protocol based on the criteria listed below:   1. Is GFR >/= 40 ml/min? Yes.    Patient's GFR today is >60 2. Is urine output >/= 0.5 ml/kg/hr for the last 6 hours? Yes.   Patient's UOP is 1.14 ml/kg/hr 3. Is BUN < 60 mg/dL? Yes.    Patient's BUN today is 10 4. Abnormal electrolyte  K 3.2,  5. Ordered repletion with: per protocol 6. If a panic level lab has been reported, has the CCM MD in charge been notified? Yes.  .   Physician:  Zenovia Jarred 09/26/2016 4:36 AM

## 2016-09-26 NOTE — Progress Notes (Signed)
eLink Physician-Brief Progress Note Patient Name: [name] DOB: May 11, 1945 MRN: [id number]   Date of Service  09/26/2016  HPI/Events of Note  RN notices swollen hands, painful.  There is clear demarcation from the swelling fingers/hands which terminates when the wrist restraints start  (-) cyanosis Good pulses Pt in and out of confusion 2/2 etoh withdrawal. On Bipap  eICU Interventions  Wrist restraints have been loosened.  Tylenol prn for pain.  Avoiding fentanyl IV as patient is drowsy and is on BiPAP.   Told RN to call back if with worsening swelling, pain     Intervention Category Major Interventions: Other:  [name] 09/26/2016, 6:28 PM

## 2016-09-26 NOTE — Progress Notes (Signed)
18:00- patient's hands noted to be extremely painful to even the lightest palpation. Patient with fevers last night and today. Patient is noted to be in bilateral wrist restraints. Hands and fingers are hot to touch, swollen and reddened bilaterally. These symptoms are present distal to restraint application. Circulation intact bilaterally. MD notified. Also, patient noted to have had blood cultures drawn one from each hand on 5/20. No obvious puncture wounds noted, some light abrasions noted on right hand. Will continue to monitor. Tylenol for pain is ordered however it is not within the given time frame to administer it. Report given to on coming RN.

## 2016-09-26 NOTE — Progress Notes (Signed)
eLink Physician-Brief Progress Note Patient Name: [name] DOB: 11/15/1945 MRN: [id number]   Date of Service  09/26/2016  HPI/Events of Note  Increased RR to 38 on BiPAP. Sat = 100%. Nurse wants to stop enteric nutrition. I think that this is a good idea. Nurse also states that the patient is more somnolent.   eICU Interventions  Will order: 1. ABG now.  2. Hold enteric nutrition.  3. Gastric tube to LIS.  4. Will ask on-call ground team to evaluate him at bedside for possible intubation.      Intervention Category Intermediate Interventions: Respiratory distress - evaluation and management  Krishang Reading [place] 09/26/2016, 11:31 PM

## 2016-09-26 NOTE — Progress Notes (Addendum)
S:  Called to bedside to assess patient for respiratory distress.    O:  Blood pressure (!) 156/82, pulse 99, temperature (!) 102.4 F (39.1 C), temperature source Axillary, resp. rate (!) 38, height 6' (1.829 m), weight 69 kg (152 lb 1.9 oz), SpO2 99 %.   General: Adult male, mild respiratory distress  Neuro: lethargic, awakens to verbal stimuli, purposeful movements  CV: RRR, no MRG, NI S1/S2  PULM: Tachypnea, Diminished breath sounds GI: non-tender, active bowel sounds  Extremities: hands are warm to touch and swollen    CBC    Component Value Date/Time   WBC 15.6 (H) 09/26/2016 0247   RBC 3.63 (L) 09/26/2016 0247   HGB 9.8 (L) 09/26/2016 0247   HCT 30.0 (L) 09/26/2016 0247   PLT 142 (L) 09/26/2016 0247   MCV 82.6 09/26/2016 0247   MCH 27.0 09/26/2016 0247   MCHC 32.7 09/26/2016 0247   RDW 16.6 (H) 09/26/2016 0247   LYMPHSABS 0.8 09/26/2016 0247   MONOABS 2.3 (H) 09/26/2016 0247   EOSABS 0.0 09/26/2016 0247   BASOSABS 0.0 09/26/2016 0247    BMET    Component Value Date/Time   NA 140 09/26/2016 0247   K 3.2 (L) 09/26/2016 0247   CL 111 09/26/2016 0247   CO2 19 (L) 09/26/2016 0247   GLUCOSE 109 (H) 09/26/2016 0247   BUN 10 09/26/2016 0247   CREATININE 1.20 09/26/2016 0247   CALCIUM 8.3 (L) 09/26/2016 0247   GFRNONAA 59 (L) 09/26/2016 0247   GFRAA >60 09/26/2016 0247      A:  Acute Respiratory Failure in setting of ETOH withdrawal +/- infection  -Patient is febrile with temp of 102.4 and tachypneic   P: -Currently protecting airway with no need of intubation at this time > will re-access throughout night  -Continue BiPAP as needed -Maintain Oxygenation >92 -Obtained CXR and ABG  -hold sedating medications  -Obtain Uric Acid level to r/o gout, ?cellultis  > hands are swollen, painful to touch, and warm    Hayden Pedro, AGACNP-BC [facility]  Pgr: [phone]  PCCM Pgr: [phone]

## 2016-09-26 NOTE — Progress Notes (Signed)
Pt place on BIPAP per MD order.  Pt tolerating well at this time.  RT will continue to monitor.

## 2016-09-26 NOTE — Progress Notes (Signed)
SLP Cancellation Note  Patient Details Name: Jahiem Franzoni MRN: [id number] DOB: 09-28-1945   Cancelled treatment:       Reason Eval/Treat Not Completed: Medical issues which prohibited therapy (On Bipap. Will f/u. )  Gabriel Rainwater [place], CCC-SLP [phone]  Adaleigh Warf Meryl 09/26/2016, 9:30 AM

## 2016-09-26 NOTE — Progress Notes (Signed)
PHARMACY - PHYSICIAN COMMUNICATION CRITICAL VALUE ALERT - BLOOD CULTURE IDENTIFICATION (BCID)  Results for orders placed or performed during the hospital encounter of 09/20/16  Blood Culture ID Panel (Reflexed) (Collected: 09/25/2016  8:24 PM)  Result Value Ref Range   Enterococcus species NOT DETECTED NOT DETECTED   Listeria monocytogenes NOT DETECTED NOT DETECTED   Staphylococcus species DETECTED (A) NOT DETECTED   Staphylococcus aureus NOT DETECTED NOT DETECTED   Methicillin resistance NOT DETECTED NOT DETECTED   Streptococcus species NOT DETECTED NOT DETECTED   Streptococcus agalactiae NOT DETECTED NOT DETECTED   Streptococcus pneumoniae NOT DETECTED NOT DETECTED   Streptococcus pyogenes NOT DETECTED NOT DETECTED   Acinetobacter baumannii NOT DETECTED NOT DETECTED   Enterobacteriaceae species NOT DETECTED NOT DETECTED   Enterobacter cloacae complex NOT DETECTED NOT DETECTED   Escherichia coli NOT DETECTED NOT DETECTED   Klebsiella oxytoca NOT DETECTED NOT DETECTED   Klebsiella pneumoniae NOT DETECTED NOT DETECTED   Proteus species NOT DETECTED NOT DETECTED   Serratia marcescens NOT DETECTED NOT DETECTED   Haemophilus influenzae NOT DETECTED NOT DETECTED   Neisseria meningitidis NOT DETECTED NOT DETECTED   Pseudomonas aeruginosa NOT DETECTED NOT DETECTED   Candida albicans NOT DETECTED NOT DETECTED   Candida glabrata NOT DETECTED NOT DETECTED   Candida krusei NOT DETECTED NOT DETECTED   Candida parapsilosis NOT DETECTED NOT DETECTED   Candida tropicalis NOT DETECTED NOT DETECTED    Name of physician (or Provider) Contacted: Dr. Corrie Dandy  Changes to prescribed antibiotics required: No changes needed  Sherlon Handing, PharmD, BCPS Clinical pharmacist, pager [phone] 09/26/2016  10:38 PM

## 2016-09-26 NOTE — Progress Notes (Signed)
Cortrak Tube Team Note:  Consult received to place a Cortrak feeding tube.   A 10 F Cortrak tube was placed in the right nare and secured with a nasal bridle at 83 cm. Per the Cortrak monitor reading the tube tip is post pyloric.   No x-ray is required. RN may begin using tube.    If the tube becomes dislodged please keep the tube and contact the Cortrak team at [email or web address] (password [email or web address]) for replacement.  If after hours and replacement cannot be delayed, place a NG tube and confirm placement with an abdominal x-ray.    Koleen Distance MS, RD, LDN Pager #- [phone]

## 2016-09-26 NOTE — Progress Notes (Signed)
Nutrition Follow-up  DOCUMENTATION CODES:   Not applicable  INTERVENTION:   Resume TF when able to place Cortrak tube:   Jevity 1.2 at 70 ml/h (1680 ml per day)   Provides 2016 kcal, 93 gm protein, 1361 ml free water daily   Continue to monitor magnesium, potassium, and phosphorus daily and replete as needed, as pt is at risk for refeeding syndrome given alcohol abuse.  NUTRITION DIAGNOSIS:   Inadequate oral intake related to inability to eat as evidenced by NPO status.  GOAL:   Patient will meet greater than or equal to 90% of their needs  MONITOR:   TF tolerance, Labs, I & O's  ASSESSMENT:   71 year old male with a past medical history significant for alcohol abuse was admitted on 09/21/2016 with acute encephalopathy in the setting of alcohol withdrawal. He required intubation for airway protection.   Discussed patient in ICU rounds and with RN today. Patient was extubated on 5/17. Requiring BiPAP since this morning. Cortrak tube was ordered, but cannot be placed until BiPAP is discontinued as it would interfere with BiPAP mask seal. Labs reviewed: potassium 3.2 (L), phosphorus 2.1 (L) CBG's: [phone] Medications reviewed and include folic acid, thiamine. Unable to complete Nutrition-Focused physical exam at this time.   Diet Order:   NPO  Skin:  Reviewed, no issues  Last BM:  5/19  Height:   Ht Readings from Last 1 Encounters:  09/21/16 6' (1.829 m)    Weight:   Wt Readings from Last 1 Encounters:  09/26/16 152 lb 1.9 oz (69 kg)    Ideal Body Weight:  80.9 kg  BMI:  Body mass index is 20.63 kg/m.  Estimated Nutritional Needs:   Kcal:  3790-2409  Protein:  90-100 gm  Fluid:  1.8-2 L  EDUCATION NEEDS:   No education needs identified at this time  Molli Barrows, [place], [place], [place] Pager [phone] After Hours Pager [phone]

## 2016-09-26 NOTE — Progress Notes (Signed)
PULMONARY / CRITICAL CARE MEDICINE   Name: [name] MRN: [id number] DOB: 1946-01-06    ADMISSION DATE:  09/20/2016 CONSULTATION DATE:  09/21/2016  REFERRING MD:  Dr. Carolyn Stare  CHIEF COMPLAINT:  Found down  HISTORY OF PRESENT ILLNESS:   71 yo male with hx of ETOH abuse found unresponsive at home, and required intubation for airway protection.  SUBJECTIVE:  Off precedex.  Remains lethargic.  VITAL SIGNS: BP (!) 162/89   Pulse (!) 102   Temp 100.3 F (37.9 C) (Oral)   Resp (!) 34   Ht 6' (1.829 m)   Wt 152 lb 1.9 oz (69 kg)   SpO2 100%   BMI 20.63 kg/m   INTAKE / OUTPUT: I/O last 3 completed shifts: In: 4098.7 [I.V.:3948.7; IV Piggyback:150] Out: 2260 [Urine:2260]  PHYSICAL EXAMINATION:  General - somnolent Eyes - pupils reactive ENT - dry mucosa, no stridor Cardiac - regular, no murmur Chest - no wheeze, rales Abd - soft, non tender Ext - Rt AKA Skin - no rashes Neuro - mumbles few words Psych - calm    LABS:  BMET  Recent Labs Lab 09/24/16 1303 09/25/16 0335 09/26/16 0247  NA 140 140 140  K 3.0* 3.3* 3.2*  CL 110 109 111  CO2 24 21* 19*  BUN 8 8 10   CREATININE 0.95 1.11 1.20  GLUCOSE 126* 122* 109*    Electrolytes  Recent Labs Lab 09/24/16 1303 09/25/16 0335 09/26/16 0247  CALCIUM 8.3* 8.3* 8.3*  MG 1.8 1.6* 2.0  PHOS 2.9 2.9 2.1*    CBC  Recent Labs Lab 09/23/16 0509 09/25/16 1425 09/26/16 0247  WBC 7.8 11.8* 15.6*  HGB 10.3* 9.8* 9.8*  HCT 32.2* 30.5* 30.0*  PLT 149* 163 142*    Coag's  Recent Labs Lab 09/26/16 0247  APTT 47*  INR 1.25    Sepsis Markers  Recent Labs Lab 09/21/16 0156 09/21/16 0501 09/21/16 0907  LATICACIDVEN 4.55* 5.0* 4.2*    ABG  Recent Labs Lab 09/21/16 0405 09/25/16 2010  PHART 7.428 7.457*  PCO2ART 42.6 30.4*  PO2ART 78.1* 89.8    Liver Enzymes  Recent Labs Lab 09/20/16 2241 09/26/16 0247  AST 38 32  ALT 35 22  ALKPHOS 86 72  BILITOT 0.7 1.6*  ALBUMIN 3.3* 2.4*     Cardiac Enzymes  Recent Labs Lab 09/21/16 0437 09/21/16 0907  TROPONINI 0.04* 0.04*    Glucose  Recent Labs Lab 09/25/16 0911 09/25/16 1301 09/25/16 1659 09/25/16 1930 09/26/16 0034 09/26/16 0349  GLUCAP 111* 99 126* 100* 105* 123*    Imaging Dg Chest Port 1 View  Result Date: 09/25/2016 CLINICAL DATA:  Respiratory distress EXAM: PORTABLE CHEST 1 VIEW COMPARISON:  Sep 21, 2016 FINDINGS: The lucency to the left of the trachea on the previous study has resolved. The ET tube has been removed. The OG tube is no longer seen. Elevation of the right hemidiaphragm is noted. No pneumothorax. The cardiomediastinal silhouette and lungs are stable. IMPRESSION: No active disease. 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  Developed alcohol withdrawal requiring precedex.  ASSESSMENT / PLAN:  Acute metabolic  encephalopathy. Alcohol withdrawal. - monitor CIWA - prn haldol, ativan - thiamine, folic acid  Fever 2/78. - day 2 of vancomycin, zosyn  Hx of COPD. Tobacco abuse. - prn BDs  Hypertension, HLD. - catapres patch, IV lopressor - prn hydralazine, labetalol - hold outpt norvasc, hyzaar, zocor  Hypokalemia. - replace electrolytes as needed  Anemia of critical illness. - f/u CBC  Resolved problems > lactic acidosis, acute respiratory failure with hypoxia  DVT prophylaxis - SQ heparin SUP - not indicated Nutrition - place coretrak and start tube feeds Goals of care - full code  CC time 31 minutes  Chesley Mires, MD [place] 09/26/2016, 7:03  AM Pager:  [phone] After 3pm call: [phone]

## 2016-09-27 ENCOUNTER — Inpatient Hospital Stay (HOSPITAL_COMMUNITY): Payer: Medicare Other

## 2016-09-27 LAB — GLUCOSE, CAPILLARY
GLUCOSE-CAPILLARY: 118 mg/dL — AB (ref 65–99)
GLUCOSE-CAPILLARY: 110 mg/dL — AB (ref 65–99)
Glucose-Capillary: 109 mg/dL — ABNORMAL HIGH (ref 65–99)
Glucose-Capillary: 119 mg/dL — ABNORMAL HIGH (ref 65–99)
Glucose-Capillary: 110 mg/dL — ABNORMAL HIGH (ref 65–99)

## 2016-09-27 LAB — CULTURE, BLOOD (ROUTINE X 2): SPECIAL REQUESTS: ADEQUATE

## 2016-09-27 LAB — BASIC METABOLIC PANEL
ANION GAP: 6 (ref 5–15)
BUN: 14 mg/dL (ref 6–20)
CHLORIDE: 113 mmol/L — AB (ref 101–111)
CO2: 21 mmol/L — ABNORMAL LOW (ref 22–32)
Calcium: 8.3 mg/dL — ABNORMAL LOW (ref 8.9–10.3)
Creatinine, Ser: 1.23 mg/dL (ref 0.61–1.24)
GFR calc Af Amer: >60 mL/min (ref >60)
GFR, EST NON AFRICAN AMERICAN: 57 mL/min — AB (ref >60)
GLUCOSE: 124 mg/dL — AB (ref 65–99)
POTASSIUM: 3.2 mmol/L — AB (ref 3.5–5.1)
SODIUM: 140 mmol/L (ref 135–145)

## 2016-09-27 LAB — LACTIC ACID, PLASMA
Lactic Acid, Venous: 0.8 mmol/L (ref 0.5–1.9)
Lactic Acid, Venous: 0.8 mmol/L (ref 0.5–1.9)

## 2016-09-27 LAB — CBC
HCT: 27.6 % — ABNORMAL LOW (ref 39.0–52.0)
HEMOGLOBIN: 9.2 g/dL — AB (ref 13.0–17.0)
MCH: 27.6 pg (ref 26.0–34.0)
MCHC: 33.3 g/dL (ref 30.0–36.0)
MCV: 82.9 fL (ref 78.0–100.0)
PLATELETS: 152 10*3/uL (ref 150–400)
RBC: 3.33 MIL/uL — ABNORMAL LOW (ref 4.22–5.81)
RDW: 17.2 % — ABNORMAL HIGH (ref 11.5–15.5)
WBC: 17 10*3/uL — AB (ref 4.0–10.5)

## 2016-09-27 LAB — PROCALCITONIN: PROCALCITONIN: 4.88 ng/mL

## 2016-09-27 LAB — URIC ACID: URIC ACID, SERUM: 5.7 mg/dL (ref 4.4–7.6)

## 2016-09-27 MED ORDER — SODIUM CHLORIDE 0.9 % IV BOLUS (SEPSIS)
500.0000 mL | Freq: Once | INTRAVENOUS | Status: AC
  Administered 2016-09-27: 500 mL via INTRAVENOUS

## 2016-09-27 MED ORDER — FENTANYL CITRATE (PF) 100 MCG/2ML IJ SOLN
25.0000 ug | INTRAMUSCULAR | Status: DC | PRN
  Administered 2016-09-27 – 2016-09-29 (×7): 50 ug via INTRAVENOUS
  Filled 2016-09-27 (×7): qty 2

## 2016-09-27 MED ORDER — POTASSIUM CHLORIDE 20 MEQ/15ML (10%) PO SOLN
40.0000 meq | Freq: Once | ORAL | Status: AC
  Administered 2016-09-27: 40 meq via ORAL
  Filled 2016-09-27: qty 30

## 2016-09-27 NOTE — Progress Notes (Signed)
SLP Cancellation Note  Patient Details Name: [name] MRN: [id number] DOB: 04/23/1946   Cancelled treatment:       Reason Eval/Treat Not Completed: Medical issues which prohibited therapy.  Remains on bipap; will follow along for readiness for swallow assessment.   [name] 09/27/2016, 12:05 PM

## 2016-09-27 NOTE — Progress Notes (Signed)
PULMONARY / CRITICAL CARE MEDICINE   Name: [name] MRN: [id number] DOB: [birth year]    ADMISSION DATE:  09/20/2016 CONSULTATION DATE:  09/21/2016  REFERRING MD:  Dr. Carolyn Stare  CHIEF COMPLAINT:  Found down  HISTORY OF PRESENT ILLNESS:   71 yo male with hx of ETOH abuse found unresponsive at home, and required intubation for airway protection.  SUBJECTIVE:  Off precedex.  Remains lethargic. Hands are edematous and painful to touch,   VITAL SIGNS: BP 110/84   Pulse (!) 110   Temp 98.1 F (36.7 C) (Axillary)   Resp (!) 23   Ht 6' (1.829 m)   Wt 147 lb 11.3 oz (67 kg)   SpO2 100%   BMI 20.03 kg/m   INTAKE / OUTPUT: I/O last 3 completed shifts: In: 3605 [I.V.:2355; NG/GT:150; IV Piggyback:1100] Out: 2420 [Urine:2420]  PHYSICAL EXAMINATION:  General - somnolent, complaining of pain in his hands Eyes - PERRLA, unable to open right eye 2/2 swelling ENT - normocephalic, atraumatic Cardiac - RRR, S1, S2, no RMG Chest - Faint exp. Wheeze Abd - soft, non tender, non-distended Ext - Rt AKA, otherwise unremarkable Skin - edema to hands bilaterally,redness to hands bilaterally and to face Neuro - attempts to answer questions, following simple commands, difficult to assess limb movement 2/2 pain Psych - lethargic and     LABS:  BMET  Recent Labs Lab 09/25/16 0335 09/26/16 0247 09/27/16 0040  NA 140 140 140  K 3.3* 3.2* 3.2*  CL 109 111 113*  CO2 21* 19* 21*  BUN 8 10 14   CREATININE 1.11 1.20 1.23  GLUCOSE 122* 109* 124*    Electrolytes  Recent Labs Lab 09/24/16 1303 09/25/16 0335 09/26/16 0247 09/27/16 0040  CALCIUM 8.3* 8.3* 8.3* 8.3*  MG 1.8 1.6* 2.0  --   PHOS 2.9 2.9 2.1*  --     CBC  Recent Labs Lab 09/25/16 1425 09/26/16 0247 09/27/16 0040  WBC 11.8* 15.6* 17.0*  HGB 9.8* 9.8* 9.2*  HCT 30.5* 30.0* 27.6*  PLT 163 142* 152    Coag's  Recent Labs Lab 09/26/16 0247  APTT 47*  INR 1.25    Sepsis Markers  Recent Labs Lab  09/21/16 0156 09/21/16 0501 09/21/16 0907  LATICACIDVEN 4.55* 5.0* 4.2*    ABG  Recent Labs Lab 09/21/16 0405 09/25/16 2010 09/26/16 2327  PHART 7.428 7.457* 7.441  PCO2ART 42.6 30.4* 31.0*  PO2ART 78.1* 89.8 155*    Liver Enzymes  Recent Labs Lab 09/20/16 2241 09/26/16 0247  AST 38 32  ALT 35 22  ALKPHOS 86 72  BILITOT 0.7 1.6*  ALBUMIN 3.3* 2.4*    Cardiac Enzymes  Recent Labs Lab 09/21/16 0437 09/21/16 0907  TROPONINI 0.04* 0.04*    Glucose  Recent Labs Lab 09/26/16 1222 09/26/16 1537 09/26/16 2002 09/26/16 2334 09/27/16 0352 09/27/16 0750  GLUCAP 102* 129* 126* 140* 109* 119*    Imaging Dg Chest Port 1 View  Result Date: 09/27/2016 CLINICAL DATA:  Respiratory distress EXAM: PORTABLE CHEST 1 VIEW COMPARISON:  Two days ago FINDINGS: Feeding tube is in place with peripyloric tip. Elevated right diaphragm, unchanged. There is no edema, consolidation, effusion, or pneumothorax. Normal heart size and stable aortic tortuosity. IMPRESSION: 1. No evidence of active disease. 2. Elevated right diaphragm. 3. Feeding tube with peripyloric tip position. Electronically Signed   By: Monte Fantasia M.D.   On: 09/27/2016 07:09     STUDIES:  09/20/2016 CT abdomen> Abdominal aortic aneurysm noted, hepatic steatosis 09/20/2016 CT  head> NAICP  CULTURES: Blood 5/20 >>MSA coag negative/ ? Contaminant Urine 5/20 >>  ANTIBIOTICS: Vancomycin 5/20 Zosyn 5/20 >>  SIGNIFICANT EVENTS: 5/18 extubated, precedex  LINES/TUBES: ETT 5/15 >5/18  DISCUSSION: 71 yo with hx of ETOH abuse found unresponsive and required intubation.  Developed alcohol withdrawal requiring precedex.  ASSESSMENT / PLAN:  Acute metabolic encephalopathy. Alcohol withdrawal. Increasing lethargy - monitor CIWA - Monitor neuro status - Consider CT head - minimize prn haldol, ativan ( lethargic) - thiamine, folic acid  Fever 6/62. WBC up trending Per wife, history of leukemia, but  unable to find any documentation  of this. - May need to add coverage for atypicals and fungal - day 2 of vancomycin, zosyn - Trend procalcitonon - Follow Micro ( Blood and urine)   Hx of COPD. Elevated R hemidiaphragm No Active disease per CXR Protecting airway Tobacco abuse. - prn BDs - Maintain oxygen saturations > 92% - Bi PAP as needed   Hx. Hypertension, HLD. 5/22 BP soft/ concern for sepsis - NS bolus 500 cc x 1 - hold catapres patch, IV lopressor  - prn hydralazine, labetalol - hold outpt norvasc, hyzaar, zocor  Hypokalemia. - replace electrolytes as needed - Monitor creatinine and urine output - avoid nephrotoxic medications  Anemia of critical illness. - trend CBC - Monitor for obvious s/s of bleeding  Swelling to hands bilaterally/ painful to touch - check pulses q shirt - venous  doppler studies  DVT prophylaxis - SQ heparin SUP - not indicated Nutrition - place coretrak and start tube feeds Goals of care - full code  APP CC   time 34  minutes  Magdalen Spatz, AGACNP-BC [place] Pager # [phone] 09/27/2016, 9:32 AM After 3pm call: [phone]

## 2016-09-28 ENCOUNTER — Inpatient Hospital Stay (HOSPITAL_COMMUNITY): Payer: Medicare Other

## 2016-09-28 DIAGNOSIS — L03119 Cellulitis of unspecified part of limb: Secondary | ICD-10-CM

## 2016-09-28 DIAGNOSIS — M7989 Other specified soft tissue disorders: Secondary | ICD-10-CM

## 2016-09-28 DIAGNOSIS — M79609 Pain in unspecified limb: Secondary | ICD-10-CM

## 2016-09-28 DIAGNOSIS — J9601 Acute respiratory failure with hypoxia: Secondary | ICD-10-CM | POA: Diagnosis present

## 2016-09-28 LAB — GLUCOSE, CAPILLARY
GLUCOSE-CAPILLARY: 150 mg/dL — AB (ref 65–99)
Glucose-Capillary: 105 mg/dL — ABNORMAL HIGH (ref 65–99)
Glucose-Capillary: 106 mg/dL — ABNORMAL HIGH (ref 65–99)
Glucose-Capillary: 109 mg/dL — ABNORMAL HIGH (ref 65–99)
Glucose-Capillary: 160 mg/dL — ABNORMAL HIGH (ref 65–99)

## 2016-09-28 LAB — BASIC METABOLIC PANEL
ANION GAP: 10 (ref 5–15)
BUN: 18 mg/dL (ref 6–20)
CALCIUM: 8.4 mg/dL — AB (ref 8.9–10.3)
CO2: 20 mmol/L — AB (ref 22–32)
CREATININE: 1.29 mg/dL — AB (ref 0.61–1.24)
Chloride: 116 mmol/L — ABNORMAL HIGH (ref 101–111)
GFR, EST NON AFRICAN AMERICAN: 54 mL/min — AB (ref >60)
Glucose, Bld: 145 mg/dL — ABNORMAL HIGH (ref 65–99)
Potassium: 3.2 mmol/L — ABNORMAL LOW (ref 3.5–5.1)
SODIUM: 146 mmol/L — AB (ref 135–145)

## 2016-09-28 LAB — VANCOMYCIN, TROUGH: Vancomycin Tr: 11 ug/mL — ABNORMAL LOW (ref 15–20)

## 2016-09-28 LAB — URINE CULTURE: Culture: >=100000 — AB

## 2016-09-28 LAB — CBC
HEMATOCRIT: 26.7 % — AB (ref 39.0–52.0)
HEMOGLOBIN: 8.6 g/dL — AB (ref 13.0–17.0)
MCH: 26.9 pg (ref 26.0–34.0)
MCHC: 32.2 g/dL (ref 30.0–36.0)
MCV: 83.4 fL (ref 78.0–100.0)
RBC: 3.2 MIL/uL — AB (ref 4.22–5.81)
RDW: 16.7 % — AB (ref 11.5–15.5)
WBC: 12.1 10*3/uL — AB (ref 4.0–10.5)

## 2016-09-28 LAB — PROCALCITONIN: PROCALCITONIN: 3.86 ng/mL

## 2016-09-28 MED ORDER — OXYCODONE HCL 5 MG PO TABS
5.0000 mg | ORAL_TABLET | ORAL | Status: DC | PRN
  Administered 2016-09-28 – 2016-09-30 (×5): 5 mg
  Filled 2016-09-28 (×5): qty 1

## 2016-09-28 MED ORDER — FREE WATER
100.0000 mL | Freq: Three times a day (TID) | Status: DC
  Administered 2016-09-28 – 2016-09-30 (×5): 100 mL

## 2016-09-28 MED ORDER — FOLIC ACID 1 MG PO TABS
1.0000 mg | ORAL_TABLET | Freq: Every day | ORAL | Status: DC
  Administered 2016-09-29 – 2016-10-01 (×3): 1 mg
  Filled 2016-09-28 (×3): qty 1

## 2016-09-28 MED ORDER — AMLODIPINE 1 MG/ML ORAL SUSPENSION
10.0000 mg | Freq: Every day | ORAL | Status: DC
  Administered 2016-09-29 – 2016-09-30 (×2): 10 mg
  Filled 2016-09-28 (×6): qty 10

## 2016-09-28 MED ORDER — METOPROLOL TARTRATE 25 MG/10 ML ORAL SUSPENSION
25.0000 mg | Freq: Two times a day (BID) | ORAL | Status: DC
  Administered 2016-09-28 – 2016-09-29 (×2): 25 mg
  Filled 2016-09-28 (×3): qty 10

## 2016-09-28 MED ORDER — METOPROLOL TARTRATE 25 MG/10 ML ORAL SUSPENSION
25.0000 mg | Freq: Two times a day (BID) | ORAL | Status: DC
  Administered 2016-09-28: 25 mg via ORAL
  Filled 2016-09-28: qty 10

## 2016-09-28 MED ORDER — VITAMIN B-1 100 MG PO TABS
100.0000 mg | ORAL_TABLET | Freq: Every day | ORAL | Status: DC
  Administered 2016-09-29 – 2016-10-01 (×3): 100 mg
  Filled 2016-09-28 (×3): qty 1

## 2016-09-28 MED ORDER — AMLODIPINE 1 MG/ML ORAL SUSPENSION
10.0000 mg | Freq: Every day | ORAL | Status: DC
  Administered 2016-09-28: 10 mg via ORAL
  Filled 2016-09-28 (×2): qty 10

## 2016-09-28 MED ORDER — ENOXAPARIN SODIUM 40 MG/0.4ML ~~LOC~~ SOLN
40.0000 mg | SUBCUTANEOUS | Status: DC
  Administered 2016-09-28 – 2016-09-29 (×2): 40 mg via SUBCUTANEOUS
  Filled 2016-09-28 (×2): qty 0.4

## 2016-09-28 MED ORDER — DEXTROSE 5 % IV SOLN
INTRAVENOUS | Status: DC
  Administered 2016-09-28 – 2016-09-29 (×2): via INTRAVENOUS

## 2016-09-28 MED ORDER — VANCOMYCIN HCL IN DEXTROSE 750-5 MG/150ML-% IV SOLN
750.0000 mg | Freq: Two times a day (BID) | INTRAVENOUS | Status: DC
  Administered 2016-09-28: 750 mg via INTRAVENOUS
  Filled 2016-09-28: qty 150

## 2016-09-28 MED ORDER — OXYCODONE HCL 5 MG PO TABS
5.0000 mg | ORAL_TABLET | ORAL | Status: DC | PRN
  Administered 2016-09-28: 5 mg via ORAL
  Filled 2016-09-28: qty 1

## 2016-09-28 MED ORDER — SODIUM CHLORIDE 0.9 % IV SOLN
3.0000 g | Freq: Four times a day (QID) | INTRAVENOUS | Status: DC
  Administered 2016-09-28 – 2016-09-29 (×4): 3 g via INTRAVENOUS
  Filled 2016-09-28 (×6): qty 3

## 2016-09-28 MED ORDER — POTASSIUM CHLORIDE 20 MEQ/15ML (10%) PO SOLN
30.0000 meq | ORAL | Status: DC
  Administered 2016-09-28: 30 meq
  Filled 2016-09-28: qty 30

## 2016-09-28 NOTE — Progress Notes (Signed)
PULMONARY / CRITICAL CARE MEDICINE   Name: [name] MRN: [id number] DOB: 27-Feb-1946    ADMISSION DATE:  09/20/2016 CONSULTATION DATE:  09/21/2016  REFERRING MD:  Dr. Carolyn Stare  CHIEF COMPLAINT:  Found down  HISTORY OF PRESENT ILLNESS:   71 yo male with hx of ETOH abuse found unresponsive at home, and required intubation for airway protection. Extubated , confused, HTN. Hx of CML in remission.  SUBJECTIVE:  Agitated and shouting   VITAL SIGNS: BP (!) 195/89   Pulse (!) 30   Temp 99.3 F (37.4 C) (Oral)   Resp (!) 28   Ht 6' (1.829 m)   Wt 149 lb 11.1 oz (67.9 kg)   SpO2 (!) 86%   BMI 20.30 kg/m   INTAKE / OUTPUT: I/O last 3 completed shifts: In: 1622.5 [I.V.:1150; NG/GT:60; IV Piggyback:412.5] Out: 1696 [Urine:3105]  PHYSICAL EXAMINATION: General:   Elderly AAM confused agitated poorly verbal HEENT: Poor dentition , NGT VEL:[id number] Neuro: Moves all ext x 3 CV: HSR hypertensive PULM: Coarse rhonchi bilaterally CH:ENID, + bs shouts when ouched Extremities: Bilateral UE swollen Skin: Rt leg amputation   LABS:  BMET  Recent Labs Lab 09/26/16 0247 09/27/16 0040 09/28/16 0346  NA 140 140 146*  K 3.2* 3.2* 3.2*  CL 111 113* 116*  CO2 19* 21* 20*  BUN 10 14 18   CREATININE 1.20 1.23 1.29*  GLUCOSE 109* 124* 145*    Electrolytes  Recent Labs Lab 09/24/16 1303 09/25/16 0335 09/26/16 0247 09/27/16 0040 09/28/16 0346  CALCIUM 8.3* 8.3* 8.3* 8.3* 8.4*  MG 1.8 1.6* 2.0  --   --   PHOS 2.9 2.9 2.1*  --   --     CBC  Recent Labs Lab 09/26/16 0247 09/27/16 0040 09/28/16 0346  WBC 15.6* 17.0* 12.1*  HGB 9.8* 9.2* 8.6*  HCT 30.0* 27.6* 26.7*  PLT 142* 152 QUESTIONABLE RESULTS, RECOMMEND RECOLLECT TO VERIFY    Coag's  Recent Labs Lab 09/26/16 0247  APTT 47*  INR 1.25    Sepsis Markers  Recent Labs Lab 09/21/16 0907 09/27/16 1021 09/27/16 1302 09/28/16 0346  LATICACIDVEN 4.2* 0.8 0.8  --   PROCALCITON  --  4.88  --  3.86     ABG  Recent Labs Lab 09/25/16 2010 09/26/16 2327  PHART 7.457* 7.441  PCO2ART 30.4* 31.0*  PO2ART 89.8 155*    Liver Enzymes  Recent Labs Lab 09/26/16 0247  AST 32  ALT 22  ALKPHOS 72  BILITOT 1.6*  ALBUMIN 2.4*    Cardiac Enzymes  Recent Labs Lab 09/21/16 0907  TROPONINI 0.04*    Glucose  Recent Labs Lab 09/27/16 1143 09/27/16 1611 09/27/16 2041 09/28/16 0012 09/28/16 0325 09/28/16 0800  GLUCAP 118* 110* 110* 105* 109* 106*    Imaging Dg Chest Port 1 View  Result Date: 09/28/2016 CLINICAL DATA:  Respiratory failure. EXAM: PORTABLE CHEST 1 VIEW COMPARISON:  No recent prior. FINDINGS: Feeding tube noted with tip below left hemidiaphragm . Heart size normal. Developing left lower lobe infiltrate cannot be excluded . Bibasilar atelectasis with elevated right hemidiaphragm again noted. Small right pleural effusion. No pneumothorax. Prior cervical spine fusion . IMPRESSION: 1. Feeding tube noted with its tip below left hemidiaphragm. 2. Developing left lower lobe infiltrate cannot be excluded . Bibasilar atelectasis with elevated right hemidiaphragm again noted. Electronically Signed   By: Marcello Moores  Register   On: 09/28/2016 06:58     STUDIES:  09/20/2016 CT abdomen> Abdominal aortic aneurysm noted, hepatic steatosis 09/20/2016 CT head>  NAICP  CULTURES: Blood 5/20 >>MSA coag negative/ ? Contaminant Urine 5/20 >>enterococcus pan ss  ANTIBIOTICS: Vancomycin 5/20 Zosyn 5/20 >>  SIGNIFICANT EVENTS: 5/18 extubated, precedex  LINES/TUBES: ETT 5/15 >5/18  DISCUSSION: 71 yo with hx of ETOH abuse found unresponsive and required intubation.  Developed alcohol withdrawal requiring precedex. 5/23 agitated, swollen arms bilaterally await ds. 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  ASSESSMENT / PLAN:  Acute metabolic encephalopathy. Alcohol withdrawal. Increasing agitation - monitor CIWA - Monitor neuro status - Consider CT  head - prn haldol, ativan ( lethargic) - thiamine, folic acid  Fever 9/41. UC with pan ss enteroccous WBC up trending -Hx of CML in remission was on Gleevec and followed by [name]. Hold Gleevec for now. - May need to add coverage for atypicals and fungal - day 3 of vancomycin, zosyn, ? Chronic aspiration therefore leave Zoysn for now. - Trend procalcitonon - Follow Micro ( Blood and urine) -may be from clots but UE duplex negative   Hx of COPD. Elevated R hemidiaphragm No Active disease per CXR Protecting airway Tobacco abuse. - prn BDs - Maintain oxygen saturations > 92% - Bi PAP as needed   Hx. Hypertension, HLD. 5/22 BP soft/ concern for sepsis - Resume amlodipine and change lopressor to per tube -Continue catapres's patch -Prn hydralazine   Hypokalemia.  Recent Labs Lab 09/26/16 0247 09/27/16 0040 09/28/16 0346  K 3.2* 3.2* 3.2*   Lab Results  Component Value Date   CREATININE 1.29 (H) 09/28/2016   CREATININE 1.23 09/27/2016   CREATININE 1.20 09/26/2016     - replace electrolytes as needed - Monitor creatinine and urine output - avoid nephrotoxic medications  Anemia of critical illness.  Recent Labs  09/27/16 0040 09/28/16 0346  HGB 9.2* 8.6*    - trend CBC - Monitor for obvious s/s of bleeding  Swelling to hands bilaterally/ painful to touch - check pulses q shirt - venous  doppler studies have been ordered as of 5/21 currently being done 5/23  DVT prophylaxis - LMWH as he is a cancer pt. SUP - not indicated Nutrition - place coretrak and start tube feeds Goals of care - full code  APP CC   time 30 minutes  Richardson Landry Minor ACNP [facility] PCCM Pager [phone] till 3 pm If no answer page [phone] 09/28/2016, 8:55 AM 7

## 2016-09-28 NOTE — Progress Notes (Addendum)
S:  Called to bedside to assess patient for respiratory distress and need for intubation    O:  Blood pressure (!) 202/103, pulse (!) 121, temperature (!) 103.2 F (39.6 C), temperature source Axillary, resp. rate (!) 38, height 6' (1.829 m), weight 67.9 kg (149 lb 11.1 oz), SpO2 100 %.   General: Adult male on nasal cannula, mild respiratory distress  Neuro: Alert, follows commands, moans, shakes head yes/no CV: Tachy, +JVD PULM: rhonchi, no wheeze, labored  GI: active bowel sounds, non-distended  Extremities: warm, swollen arms bilaterally    CBC    Component Value Date/Time   WBC 12.1 (H) 09/28/2016 0346   RBC 3.20 (L) 09/28/2016 0346   HGB 8.6 (L) 09/28/2016 0346   HCT 26.7 (L) 09/28/2016 0346   PLT  09/28/2016 0346    QUESTIONABLE RESULTS, RECOMMEND RECOLLECT TO VERIFY   MCV 83.4 09/28/2016 0346   MCH 26.9 09/28/2016 0346   MCHC 32.2 09/28/2016 0346   RDW 16.7 (H) 09/28/2016 0346   LYMPHSABS 0.8 09/26/2016 0247   MONOABS 2.3 (H) 09/26/2016 0247   EOSABS 0.0 09/26/2016 0247   BASOSABS 0.0 09/26/2016 0247    BMET    Component Value Date/Time   NA 146 (H) 09/28/2016 0346   K 3.2 (L) 09/28/2016 0346   CL 116 (H) 09/28/2016 0346   CO2 20 (L) 09/28/2016 0346   GLUCOSE 145 (H) 09/28/2016 0346   BUN 18 09/28/2016 0346   CREATININE 1.29 (H) 09/28/2016 0346   CALCIUM 8.4 (L) 09/28/2016 0346   GFRNONAA 54 (L) 09/28/2016 0346   GFRAA >60 09/28/2016 0346      A:  Acute Respiratory Failure in setting of ETOH withdrawal +/- infection  -Patient is febrile with temp of 103.1 and tachypneic 35-40   P: -Currently protecting airway with no need of intubation at this time > will re-access throughout night  -Place Patient on BiPAP and will obtain ABG in 30 minutes  -Maintain Oxygenation >92 -Obtained CXR -Hold sedating medications  -Obtain blood cultures    CC Time: 32 minutes   Hayden Pedro, AGACNP-BC [facility]  Pgr: [phone]   PCCM Pgr: [phone]   71 year old man with acute metabolic encephalopathy attributed to alcohol withdrawal, briefly require Precedex and mechanical ventilation from 5/15-5/18 . He is being treated for enterococcal UTI and aspiration pneumonitis with Zosyn. We were asked to see him emergently for respiratory distress. He does have a history of COPD and elevated right hemidiaphragm. He is febrile to 103 On exam- follows commands slightly confused, decreased breath sounds bilateral coarse, S1-S2 normal, hypertensive, bilateral swollen or tender to touch, warm  Labs show hypokalemia, mild hyponatremia, mild leukocytosis which is decreasing as his pro-calcitonin with normal lactate Stat Chest x-ray-elevated right hemidiaphragm without any infiltrates  Impression/plan Acute respiratory distress-the setting of alcohol withdrawal and high fever with underlying COPD and elevated right hemidiaphragm-he was placed on BiPAP, and work of breathing decreased considerably, settings were adjusted to 15/8 and FiO2 lowered  Upper extremity Dopplers negative for DVT but he does seem to have bilateral upper extremities cellulitis- culture showed 1/2 MSSE -continue Zosyn for now but low threshold to have vancomycin, limb elevation  [place] will continue to follow as consult  The patient is critically ill with multiple organ systems failure and requires high complexity decision making for assessment and support, frequent evaluation and titration of therapies, application of advanced monitoring technologies and extensive interpretation of multiple databases. Critical Care Time devoted to patient care  services described in this note independent of APP time is 31 minutes.   Rigoberto Noel MD

## 2016-09-28 NOTE — Progress Notes (Signed)
Transferred -in from MICU by bed, screaming when touched, bil upper ext. swollen and warm to touch. Cont. To monitor.

## 2016-09-28 NOTE — Progress Notes (Signed)
Nutrition Follow-up  DOCUMENTATION CODES:   Not applicable  INTERVENTION:   Continue:   Jevity 1.2 at 70 ml/h (1680 ml per day)   Provides 2016 kcal, 93 gm protein, 1361 ml free water daily  NUTRITION DIAGNOSIS:   Inadequate oral intake related to inability to eat as evidenced by NPO status.  Ongoing  GOAL:   Patient will meet greater than or equal to 90% of their needs   Met with TF  MONITOR:   TF tolerance, Labs, I & O's  ASSESSMENT:   71 year old male with a past medical history significant for alcohol abuse was admitted on 09/21/2016 with acute encephalopathy in the setting of alcohol withdrawal. He required intubation for airway protection.   Discussed patient in ICU rounds and with RN today. Patient was extubated on 5/17.  Requiring BiPAP this morning. Cortrak placed 5/21, tip is post-pyloric. Still unable to complete nutrition focused physical exam. Labs reviewed: potassium 3.2 (L), phosphorus 2.1 (L) CBG's: 105-109-106-150 Medications reviewed and include folic acid, thiamine.  Diet Order:   NPO  Skin:  Reviewed, no issues  Last BM:  5/19  Height:   Ht Readings from Last 1 Encounters:  09/21/16 6' (1.829 m)    Weight:   Wt Readings from Last 1 Encounters:  09/28/16 149 lb 11.1 oz (67.9 kg)    Ideal Body Weight:  80.9 kg  BMI:  Body mass index is 20.3 kg/m.  Estimated Nutritional Needs:   Kcal:  8115-7262  Protein:  90-100 gm  Fluid:  1.8-2 L  EDUCATION NEEDS:   No education needs identified at this time  Molli Barrows, [place], [place], [place] Pager [phone] After Hours Pager [phone]

## 2016-09-28 NOTE — Progress Notes (Addendum)
Pharmacy Antibiotic Note  [name] is a 71 y.o. male admitted on 09/20/2016 with sepsis and enterococcal UTI sensitive to vancomycin.  Pharmacy has been consulted for vancomycin and zosyn dosing.  Vancomycin level this AM = 11 on 500 mg IV every 12 hours.  Remains febrile - note UE swelling/dopplers negative. PCT relatively unchanged. WBC trending down at 12.1.  SCr up to 1.29. Good UOP at 0.9 cc/kg/hr.   Plan: Increase Vancomycin to 750 mg IV every 12 hours. -Target 15-20.  Monitor culture data, renal function and clinical course [facility] at [facility] prn Continue Zosyn at current dose- consider if this therapy is still needed.   Height: 6' (182.9 cm) Weight: 149 lb 11.1 oz (67.9 kg) IBW/kg (Calculated) : 77.6  Temp (24hrs), Avg:100.1 F (37.8 C), Min:97.2 F (36.2 C), Max:102.7 F (39.3 C)   Recent Labs Lab 09/23/16 0509 09/24/16 1303 09/25/16 0335 09/25/16 1425 09/26/16 0247 09/27/16 0040 09/27/16 1021 09/27/16 1302 09/28/16 0346 09/28/16 0835  WBC 7.8  --   --  11.8* 15.6* 17.0*  --   --  12.1*  --   CREATININE 1.09 0.95 1.11  --  1.20 1.23  --   --  1.29*  --   LATICACIDVEN  --   --   --   --   --   --  0.8 0.8  --   --   VANCOTROUGH  --   --   --   --   --   --   --   --   --  11*    Estimated Creatinine Clearance: 50.4 mL/min (A) (by C-G formula based on SCr of 1.29 mg/dL (H)).    No Known Allergies  Antimicrobials this admission: Vanc 5/20 >>  Zosyn 5/20 >>   Sloan Leiter, PharmD, BCPS Clinical Pharmacist Clinical Phone 09/28/2016 until 3:30 PM - [phone] After hours, please call #[phone] 09/28/2016 9:46 AM    Addendum: Discussed with Dr. Halford Chessman for narrowing therapy.  Plan: Change to Unasyn.  D/C Vanc and Zosyn.   Sloan Leiter, PharmD, BCPS Clinical Pharmacist 09/28/2016, 11:33 AM

## 2016-09-28 NOTE — Progress Notes (Signed)
SLP Cancellation Note  Patient Details Name: [name] MRN: [id number] DOB: [birth year]   Cancelled treatment:       Reason Eval/Treat Not Completed: Patient at procedure or test/unavailable. Pt also with NG, mentation not fully appropriate yet. Will check for readiness tomorrow.    Liylah Najarro, Katherene Ponto 09/28/2016, 11:48 AM

## 2016-09-28 NOTE — Progress Notes (Signed)
[facility] ADULT ICU REPLACEMENT PROTOCOL FOR AM LAB REPLACEMENT ONLY  The patient does apply for the [facility] Adult ICU Electrolyte Replacment Protocol based on the criteria listed below:   1. Is GFR >/= 40 ml/min? Yes.    Patient's GFR today is >60 2. Is urine output >/= 0.5 ml/kg/hr for the last 6 hours? Yes.   Patient's UOP is 1.29 ml/kg/hr 3. Is BUN < 60 mg/dL? Yes.    Patient's BUN today is 18 4. Abnormal electrolyte  K 3.2 5. Ordered repletion with: per protocol 6. If a panic level lab has been reported, has the CCM MD in charge been notified? Yes.  .   Physician:  Dr Novella Rob, Canary Brim 09/28/2016 5:22 AM

## 2016-09-28 NOTE — Progress Notes (Signed)
**  Preliminary report by tech**  Bilateral upper extremity venous duplex complete. There is no obvious evidence of deep or superficial vein thrombosis involving the right and left upper extremities. All clearly visualized vessels appear patent and compressible.  09/28/16 9:31 AM [name] RVT

## 2016-09-28 NOTE — Progress Notes (Signed)
Pt taken off bipap and placed on 4L [place]. Pt in no distress at this time. RN aware. RT will continue to closely monitor for need of bipap again

## 2016-09-28 NOTE — Progress Notes (Signed)
eLink Physician-Brief Progress Note Patient Name: [name] DOB: [birth year] MRN: [id number]   Date of Service  09/28/2016  HPI/Events of Note  Call from bedside RN  HR 126 RR 40  Pulse ox 100% on [place] Not intubated Moans  On camer al exam:  Mildly paradoxical  eICU Interventions  Acute resp failure  PLAN APP - > to bedside to eval for intubation need     Intervention Category Major Interventions: Other:  [name] 09/28/2016, 8:26 PM

## 2016-09-29 ENCOUNTER — Inpatient Hospital Stay (HOSPITAL_COMMUNITY): Payer: Medicare Other

## 2016-09-29 DIAGNOSIS — F10230 Alcohol dependence with withdrawal, uncomplicated: Secondary | ICD-10-CM

## 2016-09-29 DIAGNOSIS — R451 Restlessness and agitation: Secondary | ICD-10-CM

## 2016-09-29 DIAGNOSIS — I10 Essential (primary) hypertension: Secondary | ICD-10-CM

## 2016-09-29 DIAGNOSIS — J9602 Acute respiratory failure with hypercapnia: Secondary | ICD-10-CM

## 2016-09-29 DIAGNOSIS — E876 Hypokalemia: Secondary | ICD-10-CM

## 2016-09-29 DIAGNOSIS — J9600 Acute respiratory failure, unspecified whether with hypoxia or hypercapnia: Secondary | ICD-10-CM

## 2016-09-29 LAB — GLUCOSE, CAPILLARY
GLUCOSE-CAPILLARY: 110 mg/dL — AB (ref 65–99)
Glucose-Capillary: 129 mg/dL — ABNORMAL HIGH (ref 65–99)
Glucose-Capillary: 133 mg/dL — ABNORMAL HIGH (ref 65–99)

## 2016-09-29 LAB — CBC
HCT: 26.4 % — ABNORMAL LOW (ref 39.0–52.0)
Hemoglobin: 8.5 g/dL — ABNORMAL LOW (ref 13.0–17.0)
MCH: 26.7 pg (ref 26.0–34.0)
MCHC: 32.2 g/dL (ref 30.0–36.0)
MCV: 83 fL (ref 78.0–100.0)
PLATELETS: 209 10*3/uL (ref 150–400)
RBC: 3.18 MIL/uL — ABNORMAL LOW (ref 4.22–5.81)
RDW: 16.6 % — AB (ref 11.5–15.5)
WBC: 12.9 10*3/uL — ABNORMAL HIGH (ref 4.0–10.5)

## 2016-09-29 LAB — BASIC METABOLIC PANEL
Anion gap: 9 (ref 5–15)
BUN: 17 mg/dL (ref 6–20)
CALCIUM: 8.5 mg/dL — AB (ref 8.9–10.3)
CO2: 22 mmol/L (ref 22–32)
Chloride: 116 mmol/L — ABNORMAL HIGH (ref 101–111)
Creatinine, Ser: 1.25 mg/dL — ABNORMAL HIGH (ref 0.61–1.24)
GFR calc Af Amer: >60 mL/min (ref >60)
GFR, EST NON AFRICAN AMERICAN: 56 mL/min — AB (ref >60)
GLUCOSE: 163 mg/dL — AB (ref 65–99)
Potassium: 3.1 mmol/L — ABNORMAL LOW (ref 3.5–5.1)
Sodium: 147 mmol/L — ABNORMAL HIGH (ref 135–145)

## 2016-09-29 LAB — CK: Total CK: 139 U/L (ref 49–397)

## 2016-09-29 LAB — MAGNESIUM: Magnesium: 1.7 mg/dL (ref 1.7–2.4)

## 2016-09-29 LAB — PHOSPHORUS: Phosphorus: 2.2 mg/dL — ABNORMAL LOW (ref 2.5–4.6)

## 2016-09-29 LAB — PROCALCITONIN: Procalcitonin: 2.86 ng/mL

## 2016-09-29 MED ORDER — METOPROLOL TARTRATE 25 MG/10 ML ORAL SUSPENSION
50.0000 mg | Freq: Two times a day (BID) | ORAL | Status: DC
  Administered 2016-09-29: 50 mg
  Filled 2016-09-29: qty 20

## 2016-09-29 MED ORDER — METOPROLOL TARTRATE 5 MG/5ML IV SOLN: 5.0000 mg | INTRAVENOUS | Status: DC | PRN

## 2016-09-29 MED ORDER — GABAPENTIN 250 MG/5ML PO SOLN
200.0000 mg | Freq: Three times a day (TID) | ORAL | Status: DC
  Administered 2016-09-29 – 2016-10-01 (×6): 200 mg
  Filled 2016-09-29 (×6): qty 4

## 2016-09-29 MED ORDER — POTASSIUM CHLORIDE 10 MEQ/100ML IV SOLN: 10.0000 meq | INTRAVENOUS | Status: DC

## 2016-09-29 MED ORDER — PIPERACILLIN-TAZOBACTAM 3.375 G IVPB
3.3750 g | Freq: Three times a day (TID) | INTRAVENOUS | Status: AC
  Administered 2016-09-29 – 2016-10-02 (×10): 3.375 g via INTRAVENOUS
  Filled 2016-09-29 (×10): qty 50

## 2016-09-29 MED ORDER — MAGNESIUM SULFATE IN D5W 1-5 GM/100ML-% IV SOLN
1.0000 g | Freq: Once | INTRAVENOUS | Status: DC
  Filled 2016-09-29: qty 100

## 2016-09-29 MED ORDER — MAGNESIUM SULFATE 2 GM/50ML IV SOLN
2.0000 g | Freq: Once | INTRAVENOUS | Status: AC
  Administered 2016-09-29: 2 g via INTRAVENOUS
  Filled 2016-09-29: qty 50

## 2016-09-29 MED ORDER — POTASSIUM CHLORIDE 10 MEQ/100ML IV SOLN
10.0000 meq | INTRAVENOUS | Status: AC
  Administered 2016-09-29 (×4): 10 meq via INTRAVENOUS
  Filled 2016-09-29 (×4): qty 100

## 2016-09-29 MED ORDER — FENTANYL CITRATE (PF) 100 MCG/2ML IJ SOLN
25.0000 ug | INTRAMUSCULAR | Status: DC | PRN
  Administered 2016-09-29 – 2016-10-05 (×3): 25 ug via INTRAVENOUS
  Filled 2016-09-29 (×3): qty 2

## 2016-09-29 NOTE — Progress Notes (Signed)
PROGRESS NOTE    [name]  YKD:[id number] DOB: January 15, 1946 DOA: 09/20/2016 PCP: Patient, No Pcp Per   Brief Narrative:  71 year old male was admitted for unresponsiveness and acute metabolic encephalopathy likely secondary to alcohol withdrawal. He was intubated for airway protection and placed on Precedex drip which was later weaned off. During this time was also ongoing enterococcal UTI and aspiration pneumonitis therefore he was on Zosyn. He was eventually extubated and transferred to stepdown unit for further management   Assessment & Plan:   Active Problems:   Respiratory failure with hypoxia (HCC)   Respiratory distress   Acute respiratory failure (HCC)   Cellulitis of upper extremity   Acute/chronic metabolic encephalopathy with intermittent agitation -Should be out of all call withdrawal phase -Continue folic acid and thiamine -Continue neurochecks -Ativan IV as needed for agitation  Acute respiratory distress with hypoxia -Was taken off of BiPAP this morning. Will monitor him off of BiPAP and restart if necessary -CPK appears to be in normal range. May need ABG checked -Nebulizer treatments as needed every 2 hours -Likely component of diaphragm weakness. Encourage deep breaths -Pulmonary following -Minimize narcotic use. I will decrease the frequency and lower dose of his fentanyl -Chest x-ray from 5/24 shows similar appearance with low lung volumes and asymmetric elevation of right hemidiaphragm  Bilateral upper extremity swelling -Upper extremity ultrasound is negative for DVT -Continue elevation of his arm. At this time no signs of cellulitis but if necessary would not be a bad idea to start him on vancomycin.  Enterococcus UTI -Continue Zosyn at this time  Concerns of aspiration pneumonia -Continue Zosyn. He needs speech and swallow eval but for that he needs to be more awake first  History of COPD and elevated right hemidiaphragm -Nebulizer treatments as  needed  Anemia of chronic disease -Monitor hemoglobin  Hypertension -Increase metoprolol to 50 mg twice daily. Continue other medications including amlodipine, clonidine. Add Lopressor 5 mg every 2 hours as needed  Hypokalemia -Replete potassium  He is unable to participate with speech therapy due to his mental status.  DVT prophylaxis: Lovenox Code Status: Full  Family Communication:  None at bedside.  Disposition Plan: SDU  Consultants:   Pulm  Procedures:   None  Antimicrobials:   Zosyn 5/20 >>  Vanc 5/20 > 5/23   Subjective: Overnight had an episode of acute respiratory distress secondary was placed on BiPAP.Marland Kitchen Low better this morning therefore taken off. At this time he remains drowsy and tells me he doesn't feel well but when I asked or complaints he has he just stares at me and doesn't say anything. When asked if he has pain anywhere he says yes but unable to talk me where his pain is. Difficult to obtain any meaningful history from him at this time.  Objective: Vitals:   09/29/16 0747 09/29/16 0800 09/29/16 0900 09/29/16 1233  BP: (!) 196/148 (!) 183/99 (!) 167/91 (!) 156/128  Pulse: (!) 126 (!) 125 97 (!) 122  Resp: (!) 30 (!) 24 (!) 26 (!) 26  Temp: 97.5 F (36.4 C)   98.8 F (37.1 C)  TempSrc: Axillary   Oral  SpO2: 100% 100% 100% 90%  Weight:      Height:        Intake/Output Summary (Last 24 hours) at 09/29/16 1236 Last data filed at 09/29/16 1100  Gross per 24 hour  Intake          1666.67 ml  Output  1680 ml  Net           -13.33 ml   Filed Weights   09/27/16 0411 09/28/16 0335 09/29/16 0312  Weight: 67 kg (147 lb 11.3 oz) 67.9 kg (149 lb 11.1 oz) 68 kg (149 lb 14.4 oz)    Examination:  General exam: Drowsy. OG tube in place Respiratory system: Clear to auscultation. Respiratory effort normal. Cardiovascular system: S1 & S2 [name], RRR. No JVD, murmurs, rubs, gallops or clicks. No pedal edema. Gastrointestinal system: Abdomen  is nondistended, soft and nontender. No organomegaly or masses felt. Normal bowel sounds [name]. Central nervous system: Alert and oriented. No focal neurological deficits. Extremities: Symmetric 4/5 power in all extremity. bilateral upper extremity swelling Skin: No rashes, lesions or ulcers Psychiatry: Judgement and insight  impaired    Data Reviewed:   CBC:  Recent Labs Lab 09/25/16 1425 09/26/16 0247 09/27/16 0040 09/28/16 0346 09/29/16 0210  WBC 11.8* 15.6* 17.0* 12.1* 12.9*  NEUTROABS  --  12.5*  --   --   --   HGB 9.8* 9.8* 9.2* 8.6* 8.5*  HCT 30.5* 30.0* 27.6* 26.7* 26.4*  MCV 83.1 82.6 82.9 83.4 83.0  PLT 163 142* 152 QUESTIONABLE RESULTS, RECOMMEND RECOLLECT TO VERIFY 007   Basic Metabolic Panel:  Recent Labs Lab 09/23/16 0509 09/24/16 1303 09/25/16 0335 09/26/16 0247 09/27/16 0040 09/28/16 0346 09/29/16 0210  NA 146* 140 140 140 140 146* 147*  K 3.4* 3.0* 3.3* 3.2* 3.2* 3.2* 3.1*  CL 111 110 109 111 113* 116* 116*  CO2 24 24 21* 19* 21* 20* 22  GLUCOSE 112* 126* 122* 109* 124* 145* 163*  BUN 13 8 8 10 14 18 17   CREATININE 1.09 0.95 1.11 1.20 1.23 1.29* 1.25*  CALCIUM 8.6* 8.3* 8.3* 8.3* 8.3* 8.4* 8.5*  MG 1.7 1.8 1.6* 2.0  --   --  1.7  PHOS 3.0 2.9 2.9 2.1*  --   --  2.2*   GFR: Estimated Creatinine Clearance: 52.1 mL/min (A) (by C-G formula based on SCr of 1.25 mg/dL (H)). Liver Function Tests:  Recent Labs Lab 09/26/16 0247  AST 32  ALT 22  ALKPHOS 72  BILITOT 1.6*  PROT 5.2*  ALBUMIN 2.4*   No results for input(s): LIPASE, AMYLASE in the last 168 hours. No results for input(s): AMMONIA in the last 168 hours. Coagulation Profile:  Recent Labs Lab 09/26/16 0247  INR 1.25   Cardiac Enzymes:  Recent Labs Lab 09/29/16 1020  CKTOTAL 139   BNP (last 3 results) No results for input(s): PROBNP in the last 8760 hours. HbA1C: No results for input(s): HGBA1C in the last 72 hours. CBG:  Recent Labs Lab 09/28/16 0800 09/28/16 1127  09/28/16 2119 09/29/16 0046 09/29/16 0405  GLUCAP 106* 150* 160* 110* 129*   Lipid Profile: No results for input(s): CHOL, HDL, LDLCALC, TRIG, CHOLHDL, LDLDIRECT in the last 72 hours. Thyroid Function Tests: No results for input(s): TSH, T4TOTAL, FREET4, T3FREE, THYROIDAB in the last 72 hours. Anemia Panel: No results for input(s): VITAMINB12, FOLATE, FERRITIN, TIBC, IRON, RETICCTPCT in the last 72 hours. Sepsis Labs:  Recent Labs Lab 09/27/16 1021 09/27/16 1302 09/28/16 0346 09/29/16 0210  PROCALCITON 4.88  --  3.86 2.86  LATICACIDVEN 0.8 0.8  --   --     Recent Results (from the past 240 hour(s))  MRSA PCR Screening     Status: None   Collection Time: 09/21/16  3:17 AM  Result Value Ref Range Status   MRSA by  PCR NEGATIVE NEGATIVE Final    Comment:        The GeneXpert MRSA Assay (FDA approved for NASAL specimens only), is one component of a comprehensive MRSA colonization surveillance program. It is not intended to diagnose MRSA infection nor to guide or monitor treatment for MRSA infections.   Culture, blood (Routine X 2) w Reflex to ID Panel     Status: None (Preliminary result)   Collection Time: 09/25/16  8:23 PM  Result Value Ref Range Status   Specimen Description BLOOD LEFT HAND  Final   Special Requests IN PEDIATRIC BOTTLE Blood Culture adequate volume  Final   Culture NO GROWTH 3 DAYS  Final   Report Status PENDING  Incomplete  Culture, blood (Routine X 2) w Reflex to ID Panel     Status: Abnormal   Collection Time: 09/25/16  8:24 PM  Result Value Ref Range Status   Specimen Description BLOOD RIGHT HAND  Final   Special Requests   Final    BOTTLES DRAWN AEROBIC ONLY Blood Culture adequate volume   Culture  Setup Time   Final    GRAM POSITIVE COCCI IN CLUSTERS AEROBIC BOTTLE ONLY CRITICAL RESULT CALLED TO, READ BACK BY AND VERIFIED WITH: K AMEND PHARMD 2207 09/26/16 A BROWNING    Culture (A)  Final    STAPHYLOCOCCUS SPECIES (COAGULASE  NEGATIVE) THE SIGNIFICANCE OF ISOLATING THIS ORGANISM FROM A SINGLE SET OF BLOOD CULTURES WHEN MULTIPLE SETS ARE DRAWN IS UNCERTAIN. PLEASE NOTIFY THE MICROBIOLOGY DEPARTMENT WITHIN ONE WEEK IF SPECIATION AND SENSITIVITIES ARE REQUIRED.    Report Status 09/27/2016 FINAL  Final  Blood Culture ID Panel (Reflexed)     Status: Abnormal   Collection Time: 09/25/16  8:24 PM  Result Value Ref Range Status   Enterococcus species NOT DETECTED NOT DETECTED Final   Listeria monocytogenes NOT DETECTED NOT DETECTED Final   Staphylococcus species DETECTED (A) NOT DETECTED Final    Comment: Methicillin (oxacillin) susceptible coagulase negative staphylococcus. Possible blood culture contaminant (unless isolated from more than one blood culture draw or clinical case suggests pathogenicity). No antibiotic treatment is indicated for blood  culture contaminants. CRITICAL RESULT CALLED TO, READ BACK BY AND VERIFIED WITH: K AMEND PHARMD 2207 09/26/16 A BROWNING    Staphylococcus aureus NOT DETECTED NOT DETECTED Final   Methicillin resistance NOT DETECTED NOT DETECTED Final   Streptococcus species NOT DETECTED NOT DETECTED Final   Streptococcus agalactiae NOT DETECTED NOT DETECTED Final   Streptococcus pneumoniae NOT DETECTED NOT DETECTED Final   Streptococcus pyogenes NOT DETECTED NOT DETECTED Final   Acinetobacter baumannii NOT DETECTED NOT DETECTED Final   Enterobacteriaceae species NOT DETECTED NOT DETECTED Final   Enterobacter cloacae complex NOT DETECTED NOT DETECTED Final   Escherichia coli NOT DETECTED NOT DETECTED Final   Klebsiella oxytoca NOT DETECTED NOT DETECTED Final   Klebsiella pneumoniae NOT DETECTED NOT DETECTED Final   Proteus species NOT DETECTED NOT DETECTED Final   Serratia marcescens NOT DETECTED NOT DETECTED Final   Haemophilus influenzae NOT DETECTED NOT DETECTED Final   Neisseria meningitidis NOT DETECTED NOT DETECTED Final   Pseudomonas aeruginosa NOT DETECTED NOT DETECTED Final    Candida albicans NOT DETECTED NOT DETECTED Final   Candida glabrata NOT DETECTED NOT DETECTED Final   Candida krusei NOT DETECTED NOT DETECTED Final   Candida parapsilosis NOT DETECTED NOT DETECTED Final   Candida tropicalis NOT DETECTED NOT DETECTED Final  Culture, Urine     Status: Abnormal   Collection Time: 09/25/16  8:54 PM  Result Value Ref Range Status   Specimen Description URINE, CATHETERIZED  Final   Special Requests NONE  Final   Culture >=100,000 COLONIES/mL ENTEROCOCCUS FAECALIS (A)  Final   Report Status 09/28/2016 FINAL  Final   Organism ID, Bacteria ENTEROCOCCUS FAECALIS (A)  Final      Susceptibility   Enterococcus faecalis - MIC*    AMPICILLIN <=2 SENSITIVE Sensitive     LEVOFLOXACIN 1 SENSITIVE Sensitive     NITROFURANTOIN <=16 SENSITIVE Sensitive     VANCOMYCIN 1 SENSITIVE Sensitive     * >=100,000 COLONIES/mL ENTEROCOCCUS FAECALIS         Radiology Studies: Dg Chest Port 1 View  Result Date: 09/29/2016 CLINICAL DATA:  71 year old male with a history of respiratory failure EXAM: PORTABLE CHEST 1 VIEW COMPARISON:  09/28/2016, 09/27/2016, 09/25/2016 FINDINGS: Cardiomediastinal silhouette unchanged in size and contour. Calcifications of the aortic arch. Tortuosity of the descending thoracic aorta. Similar appearance of low lung volumes with asymmetric elevation of the right hemidiaphragm. No pneumothorax or pleural effusion. No confluent airspace disease. Likely atelectasis at the right base. Unchanged enteric tube terminating out of the field of view. Surgical changes of the cervical region incompletely imaged. IMPRESSION: Similar appearance of the chest x-ray with low lung volumes an asymmetric elevation the right hemidiaphragm with likely associated atelectasis. Unchanged position of the enteric tube. Electronically Signed   By: Corrie Mckusick D.O.   On: 09/29/2016 07:51   Dg Chest Port 1 View  Result Date: 09/28/2016 CLINICAL DATA:  Acute respiratory failure EXAM:  PORTABLE CHEST 1 VIEW COMPARISON:  09/28/2016 FINDINGS: Shallow inspiration with elevation of the right hemidiaphragm. Linear atelectasis in the lung bases, greater on the right. No airspace disease in the lungs. No blunting of costophrenic angles. No pneumothorax. Heart size and pulmonary vascularity are normal. Calcification of the aorta. Colonic interposition under the right hemidiaphragm. Enteric tube tip is off the field of view below the left hemidiaphragm. IMPRESSION: Shallow inspiration with elevation of right hemidiaphragm and linear atelectasis in the lung bases. Electronically Signed   By: Lucienne Capers M.D.   On: 09/28/2016 21:05   Dg Chest Port 1 View  Result Date: 09/28/2016 CLINICAL DATA:  Respiratory failure. EXAM: PORTABLE CHEST 1 VIEW COMPARISON:  No recent prior. FINDINGS: Feeding tube noted with tip below left hemidiaphragm . Heart size normal. Developing left lower lobe infiltrate cannot be excluded . Bibasilar atelectasis with elevated right hemidiaphragm again noted. Small right pleural effusion. No pneumothorax. Prior cervical spine fusion . IMPRESSION: 1. Feeding tube noted with its tip below left hemidiaphragm. 2. Developing left lower lobe infiltrate cannot be excluded . Bibasilar atelectasis with elevated right hemidiaphragm again noted. Electronically Signed   By: Marcello Moores  Register   On: 09/28/2016 06:58        Scheduled Meds: . amLODipine  10 mg Per Tube Daily  . chlorhexidine  15 mL Mouth Rinse BID  . cloNIDine  0.3 mg Transdermal Weekly  . enoxaparin (LOVENOX) injection  40 mg Subcutaneous Q24H  . folic acid  1 mg Per Tube Daily  . free water  100 mL Per Tube Q8H  . mouth rinse  15 mL Mouth Rinse q12n4p  . metoprolol tartrate  25 mg Per Tube BID  . thiamine  100 mg Per Tube Daily   Continuous Infusions: . sodium chloride    . dextrose 50 mL/hr at 09/28/16 1210  . feeding supplement (JEVITY 1.2 CAL) 1,000 mL (09/29/16 1127)  . piperacillin-tazobactam (ZOSYN)  IV 3.375 g (09/29/16 1034)     LOS: 9 days    Time spent: 35 mins     Ankit Arsenio Loader, MD [facility] Pager [phone]   If 7PM-7AM, please contact night-coverage [email or web address] Password [facility] 09/29/2016, 12:36 PM

## 2016-09-29 NOTE — Progress Notes (Signed)
eLink Physician-Brief Progress Note Patient Name: Daxtyn Rottenberg DOB: December 04, 1945 MRN: [id number]   Date of Service  09/29/2016  HPI/Events of Note  Fever to 101.2 F in spite of Tylenol X 2.   eICU Interventions  Will order: 1. Cooling blanket.     Intervention Category Major Interventions: Other:  Lysle Dingwall 09/29/2016, 4:17 AM

## 2016-09-29 NOTE — Progress Notes (Signed)
Per MD request- RT removed PT from BiPAP at approx 0915. Placed PT on 2 lpm [place]- RN aware.
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
## 2016-09-29 NOTE — Progress Notes (Addendum)
S:  Anxiety, on NIMV   O:  Blood pressure (!) 202/103, pulse (!) 121, temperature (!) 103.2 F (39.6 C), temperature source Axillary, resp. rate (!) 38, height 6' (1.829 m), weight 67.9 kg (149 lb 11.1 oz), SpO2 100 %. General: on nimv Neuro: follows int commands, confused, moves all ext HEENT: jvd wnl PULM: coarse CV:  s1 s2 RRT GI: soft, bs wnl no r/g Extremities: edema    CBC    Component Value Date/Time   WBC 12.9 (H) 09/29/2016 0210   RBC 3.18 (L) 09/29/2016 0210   HGB 8.5 (L) 09/29/2016 0210   HCT 26.4 (L) 09/29/2016 0210   PLT 209 09/29/2016 0210   MCV 83.0 09/29/2016 0210   MCH 26.7 09/29/2016 0210   MCHC 32.2 09/29/2016 0210   RDW 16.6 (H) 09/29/2016 0210   LYMPHSABS 0.8 09/26/2016 0247   MONOABS 2.3 (H) 09/26/2016 0247   EOSABS 0.0 09/26/2016 0247   BASOSABS 0.0 09/26/2016 0247    BMET    Component Value Date/Time   NA 147 (H) 09/29/2016 0210   K 3.1 (L) 09/29/2016 0210   CL 116 (H) 09/29/2016 0210   CO2 22 09/29/2016 0210   GLUCOSE 163 (H) 09/29/2016 0210   BUN 17 09/29/2016 0210   CREATININE 1.25 (H) 09/29/2016 0210   CALCIUM 8.5 (L) 09/29/2016 0210   GFRNONAA 56 (L) 09/29/2016 0210   GFRAA >60 09/29/2016 0210      A:  Acute Respiratory Failure in setting of ETOH withdrawal +/- infection  -Patient is febrile with temp of 103.1 and tachypneic 35-40  Severe enceph ETOH WD Fevers, pct down, haldol inpast small lung volume rt hypoK  P: - last abg reviewed -his pain/ anxiety is an issue and likely was driving resp concerns Would interrupt NIMV now and re assess in 4 hours, scheduled needs? pcxr in am to re assess rt base and loss lung volumes Would avoid antipsychotics with fevers unclear Use benzo when needed No role precedex O2 support He is nosocomial, regardless pct, still fevers, change unasyn to zosyn Get new sputum Get cpk, r/o low level NMS  Ccm time 30 min   Lavon Paganini. Titus Mould, MD, [place] Pgr: [facility]

## 2016-09-30 ENCOUNTER — Encounter (HOSPITAL_COMMUNITY): Payer: Self-pay | Admitting: Family Medicine

## 2016-09-30 ENCOUNTER — Inpatient Hospital Stay (HOSPITAL_COMMUNITY): Payer: Medicare Other

## 2016-09-30 DIAGNOSIS — B952 Enterococcus as the cause of diseases classified elsewhere: Secondary | ICD-10-CM | POA: Diagnosis present

## 2016-09-30 DIAGNOSIS — D72829 Elevated white blood cell count, unspecified: Secondary | ICD-10-CM | POA: Diagnosis present

## 2016-09-30 DIAGNOSIS — J9620 Acute and chronic respiratory failure, unspecified whether with hypoxia or hypercapnia: Secondary | ICD-10-CM

## 2016-09-30 DIAGNOSIS — E870 Hyperosmolality and hypernatremia: Secondary | ICD-10-CM | POA: Diagnosis not present

## 2016-09-30 DIAGNOSIS — K861 Other chronic pancreatitis: Secondary | ICD-10-CM | POA: Diagnosis present

## 2016-09-30 DIAGNOSIS — G9341 Metabolic encephalopathy: Secondary | ICD-10-CM | POA: Diagnosis present

## 2016-09-30 DIAGNOSIS — M109 Gout, unspecified: Secondary | ICD-10-CM | POA: Diagnosis present

## 2016-09-30 DIAGNOSIS — F322 Major depressive disorder, single episode, severe without psychotic features: Secondary | ICD-10-CM | POA: Diagnosis present

## 2016-09-30 DIAGNOSIS — E876 Hypokalemia: Secondary | ICD-10-CM | POA: Diagnosis present

## 2016-09-30 DIAGNOSIS — J9621 Acute and chronic respiratory failure with hypoxia: Secondary | ICD-10-CM

## 2016-09-30 DIAGNOSIS — E860 Dehydration: Secondary | ICD-10-CM | POA: Diagnosis present

## 2016-09-30 DIAGNOSIS — Z452 Encounter for adjustment and management of vascular access device: Secondary | ICD-10-CM

## 2016-09-30 DIAGNOSIS — N390 Urinary tract infection, site not specified: Secondary | ICD-10-CM

## 2016-09-30 LAB — BASIC METABOLIC PANEL
ANION GAP: 10 (ref 5–15)
Anion gap: 11 (ref 5–15)
BUN: 26 mg/dL — AB (ref 6–20)
BUN: 31 mg/dL — AB (ref 6–20)
CHLORIDE: 114 mmol/L — AB (ref 101–111)
CO2: 25 mmol/L (ref 22–32)
CO2: 23 mmol/L (ref 22–32)
CREATININE: 1.4 mg/dL — AB (ref 0.61–1.24)
Calcium: 8.8 mg/dL — ABNORMAL LOW (ref 8.9–10.3)
Calcium: 8.5 mg/dL — ABNORMAL LOW (ref 8.9–10.3)
Chloride: 111 mmol/L (ref 101–111)
Creatinine, Ser: 1.27 mg/dL — ABNORMAL HIGH (ref 0.61–1.24)
GFR calc Af Amer: 57 mL/min — ABNORMAL LOW (ref >60)
GFR calc non Af Amer: 55 mL/min — ABNORMAL LOW (ref >60)
GFR, EST NON AFRICAN AMERICAN: 49 mL/min — AB (ref >60)
GLUCOSE: 203 mg/dL — AB (ref 65–99)
Glucose, Bld: 138 mg/dL — ABNORMAL HIGH (ref 65–99)
POTASSIUM: 3.6 mmol/L (ref 3.5–5.1)
Potassium: 3.4 mmol/L — ABNORMAL LOW (ref 3.5–5.1)
SODIUM: 149 mmol/L — AB (ref 135–145)
SODIUM: 145 mmol/L (ref 135–145)

## 2016-09-30 LAB — POCT I-STAT 3, ART BLOOD GAS (G3+)
Acid-Base Excess: 1 mmol/L (ref 0.0–2.0)
BICARBONATE: 25.5 mmol/L (ref 20.0–28.0)
O2 Saturation: 92 %
PCO2 ART: 36.2 mmHg (ref 32.0–48.0)
PH ART: 7.453 — AB (ref 7.350–7.450)
PO2 ART: 58 mmHg — AB (ref 83.0–108.0)
Patient temperature: 97.7
TCO2: 27 mmol/L (ref 0–100)

## 2016-09-30 LAB — CULTURE, BLOOD (ROUTINE X 2)
CULTURE: NO GROWTH
SPECIAL REQUESTS: ADEQUATE

## 2016-09-30 LAB — EXPECTORATED SPUTUM ASSESSMENT W GRAM STAIN, RFLX TO RESP C

## 2016-09-30 LAB — EXPECTORATED SPUTUM ASSESSMENT W REFEX TO RESP CULTURE

## 2016-09-30 LAB — GLUCOSE, CAPILLARY
GLUCOSE-CAPILLARY: 127 mg/dL — AB (ref 65–99)
GLUCOSE-CAPILLARY: 150 mg/dL — AB (ref 65–99)
Glucose-Capillary: 147 mg/dL — ABNORMAL HIGH (ref 65–99)

## 2016-09-30 LAB — LIPASE, BLOOD: LIPASE: 21 U/L (ref 11–51)

## 2016-09-30 LAB — MAGNESIUM: MAGNESIUM: 2.3 mg/dL (ref 1.7–2.4)

## 2016-09-30 MED ORDER — FREE WATER
150.0000 mL | Status: DC
  Administered 2016-09-30 – 2016-10-01 (×8): 150 mL

## 2016-09-30 MED ORDER — DEXTROSE 5 % IV SOLN
INTRAVENOUS | Status: DC
  Administered 2016-09-30: 11:00:00 via INTRAVENOUS

## 2016-09-30 MED ORDER — LORAZEPAM 2 MG/ML IJ SOLN
1.0000 mg | INTRAMUSCULAR | Status: DC | PRN
  Administered 2016-10-01: 1 mg via INTRAVENOUS
  Filled 2016-09-30: qty 1

## 2016-09-30 MED ORDER — DEXTROSE 5 % IV BOLUS
250.0000 mL | Freq: Once | INTRAVENOUS | Status: AC
  Administered 2016-09-30: 250 mL via INTRAVENOUS

## 2016-09-30 MED ORDER — IPRATROPIUM-ALBUTEROL 0.5-2.5 (3) MG/3ML IN SOLN
3.0000 mL | RESPIRATORY_TRACT | Status: DC
  Administered 2016-09-30 (×3): 3 mL via RESPIRATORY_TRACT
  Filled 2016-09-30 (×3): qty 3

## 2016-09-30 MED ORDER — ENOXAPARIN SODIUM 40 MG/0.4ML ~~LOC~~ SOLN
40.0000 mg | SUBCUTANEOUS | Status: DC
  Administered 2016-10-01: 40 mg via SUBCUTANEOUS
  Filled 2016-09-30: qty 0.4

## 2016-09-30 MED ORDER — METOPROLOL TARTRATE 25 MG/10 ML ORAL SUSPENSION
100.0000 mg | Freq: Two times a day (BID) | ORAL | Status: DC
  Administered 2016-09-30 – 2016-10-01 (×3): 100 mg
  Filled 2016-09-30 (×4): qty 40

## 2016-09-30 MED ORDER — METHYLPREDNISOLONE SODIUM SUCC 125 MG IJ SOLR
60.0000 mg | Freq: Four times a day (QID) | INTRAMUSCULAR | Status: AC
  Administered 2016-09-30 – 2016-10-01 (×5): 60 mg via INTRAVENOUS
  Filled 2016-09-30 (×5): qty 2

## 2016-09-30 NOTE — Progress Notes (Signed)
Came to assess pt, pt is extremely agitated at this time. Cussing at me. Pt Refuse neb. Family at bedside. SATs are stable at this time. No Respiratory Distress noted.

## 2016-09-30 NOTE — Progress Notes (Signed)
S:  Anxiety, off NIMV, some rise IN RR   O:  Blood pressure (!) 202/103, pulse (!) 121, temperature (!) 103.2 F (39.6 C), temperature source Axillary, resp. rate (!) 38, height 6' (1.829 m), weight 67.9 kg (149 lb 11.1 oz), SpO2 100 %. General: awakens, mild distress Neuro: pain diffuse all ext , perr, does answer questions HEENT: jvd  Low, no stridor PULM: coarse mild CV:  s1 s2 RRR GI: soft, BS wnl, no r Extremities:  Edema increased     CBC    Component Value Date/Time   WBC 12.9 (H) 09/29/2016 0210   RBC 3.18 (L) 09/29/2016 0210   HGB 8.5 (L) 09/29/2016 0210   HCT 26.4 (L) 09/29/2016 0210   PLT 209 09/29/2016 0210   MCV 83.0 09/29/2016 0210   MCH 26.7 09/29/2016 0210   MCHC 32.2 09/29/2016 0210   RDW 16.6 (H) 09/29/2016 0210   LYMPHSABS 0.8 09/26/2016 0247   MONOABS 2.3 (H) 09/26/2016 0247   EOSABS 0.0 09/26/2016 0247   BASOSABS 0.0 09/26/2016 0247    BMET    Component Value Date/Time   NA 149 (H) 09/30/2016 0301   K 3.6 09/30/2016 0301   CL 114 (H) 09/30/2016 0301   CO2 25 09/30/2016 0301   GLUCOSE 138 (H) 09/30/2016 0301   BUN 26 (H) 09/30/2016 0301   CREATININE 1.27 (H) 09/30/2016 0301   CALCIUM 8.8 (L) 09/30/2016 0301   GFRNONAA 55 (L) 09/30/2016 0301   GFRAA >60 09/30/2016 0301      A:  Acute Respiratory Failure in setting of ETOH withdrawal +/- infection  Severe enceph ETOH WD Fevers, pct down, haldol in past small lung volume rt Ileus likely Hypernatremia R/o low grade NMS  P: - cpk not impressive, continue to avoid haldol and other dop antagonists -stat BABG, may need NMIV back now and transfer to icu, but I have some concerns ileus? -pcxr in am  -CT head needed vs MRI -correct Na more aggressive -No lasix -unclear to me, some of his resp status likely is neuro driven -with K0O will repeat bmet in pm -get abdo film -maintain zosyn -would suggest neurology consult  Ccm time 30 min   Lavon Paganini. Titus Mould, MD, [place] Pgr:  [facility]

## 2016-09-30 NOTE — Progress Notes (Signed)
Patient complaining of 10/10 pain every where, but especially when you touch his fingers, hands, and arms. Patient describes it as burning. Wondering if he is having neuropathy pain. Text paged provider on call who ordered Gabapentin. Will continue to monitor effectiveness.

## 2016-09-30 NOTE — Progress Notes (Signed)
PT Cancellation Note  Patient Details Name: [name] MRN: [id number] DOB: 06-12-1945   Cancelled Treatment:    Reason Eval/Treat Not Completed: Patient not medically ready Per RN, pt not medically stable yet to participate in PT evaluation. Will follow.   Marguarite Arbour A Jamaal Bernasconi 09/30/2016, 11:06 AM Wray Kearns, PT, DPT [phone]

## 2016-09-30 NOTE — Progress Notes (Signed)
SLP Cancellation Note  Patient Details Name: [name] MRN: [id number] DOB: 01-May-1946   Cancelled treatment:       Reason Eval/Treat Not Completed: Fatigue/lethargy limiting ability to participate.  Pt somnolent this morning.  Instructed RN to call SLP [phone] if pt arouses this afternoon. Otherwise, will f/u next date for improvements.   [name] 09/30/2016, 10:01 AM

## 2016-09-30 NOTE — Care Management Note (Signed)
Case Management Note  Patient Details  Name: [name] MRN: [id number] Date of Birth: 01-08-1946  Subjective/Objective:  Pt admitted post being found down with ETOH                    Action/Plan:   Pt is intubated - no family at bedside.  CSW consulted for ETOH   Expected Discharge Date:                  Expected Discharge Plan:  Home/Self Care  In-House Referral:     Discharge planning Services  CM Consult  Post Acute Care Choice:    Choice offered to:     DME Arranged:    DME Agency:     HH Arranged:    HH Agency:     Status of Service:     If discussed at [employer], dates discussed:    Additional Comments: CM attempted to assess pt again today - however pt is not interactive.   Per bedside nurse pt has long time girlfriend [name].  CM contacted resource -Per girlfriend - PTA pt was completely independent.  CSW following for ETOH abuse.  CM requested PT/OT eval from attending when medically stable Maryclare Labrador, RN 09/30/2016, 10:08 AM

## 2016-09-30 NOTE — Progress Notes (Addendum)
PROGRESS NOTE    [name]  QZE:[id number] DOB: July 23, 1945 DOA: 09/20/2016 PCP: Patient, No Pcp Per   Brief Narrative:  71 year old male was admitted for unresponsiveness and acute metabolic encephalopathy likely secondary to alcohol withdrawal. He was intubated for airway protection and placed on Precedex drip which was later weaned off. During this time was also ongoing enterococcal UTI and aspiration pneumonitis therefore he was on Zosyn. He was eventually extubated and transferred to stepdown unit for further management  Assessment & Plan:   Active Problems:   Respiratory failure with hypoxia (HCC)   Respiratory distress   Acute respiratory failure (HCC)   Cellulitis of upper extremity  Acute/chronic metabolic encephalopathy with intermittent agitation -Continue folic acid and thiamine -Continue neurochecks -Ativan IV as needed for agitation --Pt still encephalopathic, little mental improvement, discussed with family, this is not his baseline, he apparently has history of severe depression, possible withdrawal from his psychiatric medications - Repeat CT head 5/25  Acute respiratory distress with hypoxia -Off BiPAP but now having shallow breathing and wheezing with poor air movement, - Ordered STAT duoneb, schedule duonebs every 4 hours, restart bipap as needed, IV solumedrol ordered 5/25 -CPK appears to be in normal range. May need ABG checked -Pulmonary following -Minimize narcotic use.  -Chest x-ray from 5/24 shows similar appearance with low lung volumes and asymmetric elevation of right hemidiaphragm  Bilateral upper extremity swelling Acute Gouty arthritis -Upper extremity ultrasound is negative for DVT -Continue elevation of his arm. --I suspect this is exacerbation of gout, ordered steroids IV, follow clinically, very painful to touch  Chronic pancreatitis - Pt not on his pancreatic enzymes with tube feeding, will check a lipase level as he seems to be having pain  with abdominal exam  Enterococcus UTI -Continue Zosyn   Acute Kidney Injury - likely prerenal as he has free water deficit, increasing free water and repeat BMP in AM.   Concerns of aspiration pneumonia -Continue Zosyn. Awaiting SLP evaluation.   Hypernatremia - He has total body free water deficit, will order small fluid bolus of D5W and increase free water to 150 ml every 4 hours, repeat BMP in AM  COPD and elevated right hemidiaphragm -Added steroids and scheduled nebs 5/25, bipap as needed  Poor vascular access - ordered PICC line 5/25  Severe depression - will need to get him back on his medication as soon as he can take oral meds and likely get psychiatry consult when he is more alert and able to participate in discussions.   Anemia of chronic disease -Monitor hemoglobin  Malignant difficult to control Hypertension -Increased metoprolol to 100 mg twice daily. Continue other medications including amlodipine, clonidine. Add hydralazine as needed  Hypokalemia -Repleted potassium  DVT prophylaxis: Lovenox Code Status: Full  Family Communication:  POA at bedside.  Disposition Plan: SDU  Consultants:   Pulm  Procedures:   None  Antimicrobials:   Zosyn 5/20 >>  Vanc 5/20 > 5/23  Subjective: Pt remains encephalopathic.   Objective: Vitals:   09/30/16 0200 09/30/16 0316 09/30/16 0730 09/30/16 0800  BP: (!) 188/80 (!) 184/91 (!) 171/85 (!) 185/103  Pulse: 94 98 (!) 102 96  Resp: (!) 32 (!) 36 (!) 34   Temp:  100.1 F (37.8 C) 99.1 F (37.3 C)   TempSrc:  Rectal Oral   SpO2: 97% 97% 98%   Weight:  69.2 kg (152 lb 9.6 oz)    Height:        Intake/Output Summary (Last 24 hours)  at 09/30/16 0827 Last data filed at 09/30/16 0603  Gross per 24 hour  Intake          2294.17 ml  Output             2110 ml  Net           184.17 ml   Filed Weights   09/28/16 0335 09/29/16 0312 09/30/16 0316  Weight: 67.9 kg (149 lb 11.1 oz) 68 kg (149 lb 14.4 oz) 69.2 kg  (152 lb 9.6 oz)    Examination:  General exam: confused, encephalopathic, OG tube in place Respiratory system: shallow breathing with basilar bilateral exp wheezes heard. Cardiovascular system: S1 & S2 heard, RRR. No JVD, murmurs, rubs, gallops or clicks. No pedal edema. Gastrointestinal system: Abdomen is nondistended, soft and nontender. No organomegaly or masses felt. Normal bowel sounds heard. Central nervous system: Alert and oriented. No focal neurological deficits. Extremities:  bilateral upper extremity swelling wrist and hands Skin: No rashes, lesions or ulcers Psychiatry: encephalopathic  Data Reviewed:   CBC:  Recent Labs Lab 09/25/16 1425 09/26/16 0247 09/27/16 0040 09/28/16 0346 09/29/16 0210  WBC 11.8* 15.6* 17.0* 12.1* 12.9*  NEUTROABS  --  12.5*  --   --   --   HGB 9.8* 9.8* 9.2* 8.6* 8.5*  HCT 30.5* 30.0* 27.6* 26.7* 26.4*  MCV 83.1 82.6 82.9 83.4 83.0  PLT 163 142* 152 QUESTIONABLE RESULTS, RECOMMEND RECOLLECT TO VERIFY 578   Basic Metabolic Panel:  Recent Labs Lab 09/24/16 1303 09/25/16 0335 09/26/16 0247 09/27/16 0040 09/28/16 0346 09/29/16 0210 09/30/16 0301  NA 140 140 140 140 146* 147* 149*  K 3.0* 3.3* 3.2* 3.2* 3.2* 3.1* 3.6  CL 110 109 111 113* 116* 116* 114*  CO2 24 21* 19* 21* 20* 22 25  GLUCOSE 126* 122* 109* 124* 145* 163* 138*  BUN 8 8 10 14 18 17  26*  CREATININE 0.95 1.11 1.20 1.23 1.29* 1.25* 1.27*  CALCIUM 8.3* 8.3* 8.3* 8.3* 8.4* 8.5* 8.8*  MG 1.8 1.6* 2.0  --   --  1.7 2.3  PHOS 2.9 2.9 2.1*  --   --  2.2*  --    GFR: Estimated Creatinine Clearance: 52.2 mL/min (A) (by C-G formula based on SCr of 1.27 mg/dL (H)). Liver Function Tests:  Recent Labs Lab 09/26/16 0247  AST 32  ALT 22  ALKPHOS 72  BILITOT 1.6*  PROT 5.2*  ALBUMIN 2.4*   No results for input(s): LIPASE, AMYLASE in the last 168 hours. No results for input(s): AMMONIA in the last 168 hours. Coagulation Profile:  Recent Labs Lab 09/26/16 0247  INR  1.25   Cardiac Enzymes:  Recent Labs Lab 09/29/16 1020  CKTOTAL 139   BNP (last 3 results) No results for input(s): PROBNP in the last 8760 hours. HbA1C: No results for input(s): HGBA1C in the last 72 hours. CBG:  Recent Labs Lab 09/29/16 0046 09/29/16 0405 09/29/16 1948 09/29/16 2330 09/30/16 0328  GLUCAP 110* 129* 133* 150* 147*   Lipid Profile: No results for input(s): CHOL, HDL, LDLCALC, TRIG, CHOLHDL, LDLDIRECT in the last 72 hours. Thyroid Function Tests: No results for input(s): TSH, T4TOTAL, FREET4, T3FREE, THYROIDAB in the last 72 hours. Anemia Panel: No results for input(s): VITAMINB12, FOLATE, FERRITIN, TIBC, IRON, RETICCTPCT in the last 72 hours. Sepsis Labs:  Recent Labs Lab 09/27/16 1021 09/27/16 1302 09/28/16 0346 09/29/16 0210  PROCALCITON 4.88  --  3.86 2.86  LATICACIDVEN 0.8 0.8  --   --  Recent Results (from the past 240 hour(s))  MRSA PCR Screening     Status: None   Collection Time: 09/21/16  3:17 AM  Result Value Ref Range Status   MRSA by PCR NEGATIVE NEGATIVE Final    Comment:        The GeneXpert MRSA Assay (FDA approved for NASAL specimens only), is one component of a comprehensive MRSA colonization surveillance program. It is not intended to diagnose MRSA infection nor to guide or monitor treatment for MRSA infections.   Culture, blood (Routine X 2) w Reflex to ID Panel     Status: None (Preliminary result)   Collection Time: 09/25/16  8:23 PM  Result Value Ref Range Status   Specimen Description BLOOD LEFT HAND  Final   Special Requests IN PEDIATRIC BOTTLE Blood Culture adequate volume  Final   Culture NO GROWTH 4 DAYS  Final   Report Status PENDING  Incomplete  Culture, blood (Routine X 2) w Reflex to ID Panel     Status: Abnormal   Collection Time: 09/25/16  8:24 PM  Result Value Ref Range Status   Specimen Description BLOOD RIGHT HAND  Final   Special Requests   Final    BOTTLES DRAWN AEROBIC ONLY Blood Culture  adequate volume   Culture  Setup Time   Final    GRAM POSITIVE COCCI IN CLUSTERS AEROBIC BOTTLE ONLY CRITICAL RESULT CALLED TO, READ BACK BY AND VERIFIED WITH: K AMEND PHARMD 2207 09/26/16 A BROWNING    Culture (A)  Final    STAPHYLOCOCCUS SPECIES (COAGULASE NEGATIVE) THE SIGNIFICANCE OF ISOLATING THIS ORGANISM FROM A SINGLE SET OF BLOOD CULTURES WHEN MULTIPLE SETS ARE DRAWN IS UNCERTAIN. PLEASE NOTIFY THE MICROBIOLOGY DEPARTMENT WITHIN ONE WEEK IF SPECIATION AND SENSITIVITIES ARE REQUIRED.    Report Status 09/27/2016 FINAL  Final  Blood Culture ID Panel (Reflexed)     Status: Abnormal   Collection Time: 09/25/16  8:24 PM  Result Value Ref Range Status   Enterococcus species NOT DETECTED NOT DETECTED Final   Listeria monocytogenes NOT DETECTED NOT DETECTED Final   Staphylococcus species DETECTED (A) NOT DETECTED Final    Comment: Methicillin (oxacillin) susceptible coagulase negative staphylococcus. Possible blood culture contaminant (unless isolated from more than one blood culture draw or clinical case suggests pathogenicity). No antibiotic treatment is indicated for blood  culture contaminants. CRITICAL RESULT CALLED TO, READ BACK BY AND VERIFIED WITH: K AMEND PHARMD 2207 09/26/16 A BROWNING    Staphylococcus aureus NOT DETECTED NOT DETECTED Final   Methicillin resistance NOT DETECTED NOT DETECTED Final   Streptococcus species NOT DETECTED NOT DETECTED Final   Streptococcus agalactiae NOT DETECTED NOT DETECTED Final   Streptococcus pneumoniae NOT DETECTED NOT DETECTED Final   Streptococcus pyogenes NOT DETECTED NOT DETECTED Final   Acinetobacter baumannii NOT DETECTED NOT DETECTED Final   Enterobacteriaceae species NOT DETECTED NOT DETECTED Final   Enterobacter cloacae complex NOT DETECTED NOT DETECTED Final   Escherichia coli NOT DETECTED NOT DETECTED Final   Klebsiella oxytoca NOT DETECTED NOT DETECTED Final   Klebsiella pneumoniae NOT DETECTED NOT DETECTED Final   Proteus  species NOT DETECTED NOT DETECTED Final   Serratia marcescens NOT DETECTED NOT DETECTED Final   Haemophilus influenzae NOT DETECTED NOT DETECTED Final   Neisseria meningitidis NOT DETECTED NOT DETECTED Final   Pseudomonas aeruginosa NOT DETECTED NOT DETECTED Final   Candida albicans NOT DETECTED NOT DETECTED Final   Candida glabrata NOT DETECTED NOT DETECTED Final   Candida krusei NOT DETECTED  NOT DETECTED Final   Candida parapsilosis NOT DETECTED NOT DETECTED Final   Candida tropicalis NOT DETECTED NOT DETECTED Final  Culture, Urine     Status: Abnormal   Collection Time: 09/25/16  8:54 PM  Result Value Ref Range Status   Specimen Description URINE, CATHETERIZED  Final   Special Requests NONE  Final   Culture >=100,000 COLONIES/mL ENTEROCOCCUS FAECALIS (A)  Final   Report Status 09/28/2016 FINAL  Final   Organism ID, Bacteria ENTEROCOCCUS FAECALIS (A)  Final      Susceptibility   Enterococcus faecalis - MIC*    AMPICILLIN <=2 SENSITIVE Sensitive     LEVOFLOXACIN 1 SENSITIVE Sensitive     NITROFURANTOIN <=16 SENSITIVE Sensitive     VANCOMYCIN 1 SENSITIVE Sensitive     * >=100,000 COLONIES/mL ENTEROCOCCUS FAECALIS    Radiology Studies: Dg Chest Port 1 View  Result Date: 09/30/2016 CLINICAL DATA:  Acute respiratory failure EXAM: PORTABLE CHEST 1 VIEW COMPARISON:  09/29/2016 FINDINGS: Elevation of the right hemidiaphragm, stable. No confluent airspace opacities. Tortuosity of the thoracic aorta. Heart is borderline in size. No visible effusions. IMPRESSION: Elevated right hemidiaphragm.  No active disease. Electronically Signed   By: Rolm Baptise M.D.   On: 09/30/2016 07:17   Dg Chest Port 1 View  Result Date: 09/29/2016 CLINICAL DATA:  71 year old male with a history of respiratory failure EXAM: PORTABLE CHEST 1 VIEW COMPARISON:  09/28/2016, 09/27/2016, 09/25/2016 FINDINGS: Cardiomediastinal silhouette unchanged in size and contour. Calcifications of the aortic arch. Tortuosity of the  descending thoracic aorta. Similar appearance of low lung volumes with asymmetric elevation of the right hemidiaphragm. No pneumothorax or pleural effusion. No confluent airspace disease. Likely atelectasis at the right base. Unchanged enteric tube terminating out of the field of view. Surgical changes of the cervical region incompletely imaged. IMPRESSION: Similar appearance of the chest x-ray with low lung volumes an asymmetric elevation the right hemidiaphragm with likely associated atelectasis. Unchanged position of the enteric tube. Electronically Signed   By: Corrie Mckusick D.O.   On: 09/29/2016 07:51   Dg Chest Port 1 View  Result Date: 09/28/2016 CLINICAL DATA:  Acute respiratory failure EXAM: PORTABLE CHEST 1 VIEW COMPARISON:  09/28/2016 FINDINGS: Shallow inspiration with elevation of the right hemidiaphragm. Linear atelectasis in the lung bases, greater on the right. No airspace disease in the lungs. No blunting of costophrenic angles. No pneumothorax. Heart size and pulmonary vascularity are normal. Calcification of the aorta. Colonic interposition under the right hemidiaphragm. Enteric tube tip is off the field of view below the left hemidiaphragm. IMPRESSION: Shallow inspiration with elevation of right hemidiaphragm and linear atelectasis in the lung bases. Electronically Signed   By: Lucienne Capers M.D.   On: 09/28/2016 21:05   Scheduled Meds: . amLODipine  10 mg Per Tube Daily  . chlorhexidine  15 mL Mouth Rinse BID  . cloNIDine  0.3 mg Transdermal Weekly  . enoxaparin (LOVENOX) injection  40 mg Subcutaneous Q24H  . folic acid  1 mg Per Tube Daily  . free water  150 mL Per Tube Q4H  . gabapentin  200 mg Per Tube Q8H  . mouth rinse  15 mL Mouth Rinse q12n4p  . metoprolol tartrate  50 mg Per Tube BID  . thiamine  100 mg Per Tube Daily   Continuous Infusions: . sodium chloride    . feeding supplement (JEVITY 1.2 CAL) 1,000 mL (09/29/16 1900)  . piperacillin-tazobactam (ZOSYN)  IV  3.375 g (09/30/16 0535)  LOS: 10 days   Critical Care Time spent: 28 mins   Irwin Brakeman, MD [facility] Pager [phone]  If 7PM-7AM, please contact night-coverage [email or web address] Password [facility] 09/30/2016, 8:27 AM

## 2016-09-30 NOTE — Consult Note (Signed)
NEURO HOSPITALIST CONSULT NOTE   Requesting physician: Dr. Wynetta Emery  Reason for Consult: Delirium   History obtained from:  Chart   HPI:                                                                                                                                          [name] is a 71 y.o. male with a known history of denied alcohol abuse who was admitted on 5/15 for nonresponsiveness after being found down at his home. EtOH was 301 on arrival.  He has been intermittently combative and confused during his hospital course, thought to be complicated by alcohol withdrawal. While in the hospital, he was found to have enterococcal UTI and aspiration pneumonitis. Currently being treated with Zosyn.  On my visit, [name] is severely encephalopathic. He rouses to loud voices, mumbles incoherently for the most part. He does not know where he is but knows his name. He yells (as if in pain) to any touch, but only says that he "doesn't know" what hurts. He became more interactive with constant stimulation, even muttering some audible phrases regarding wanting to go home and wanting help.   Current pertinent meds:  Ativan, Haldol PRN Gabapentin 200mg  q8h Solumedrol 60mg  q6h Thiamine 100mg  daily Folate 1mg  daily  Imaging/Labs: WBC: 12.9 Slightly elevated chloride (114) and sodium (149) PO2: 69mmHg; pH 7.453 CT head 5/15 unrevealing  History reviewed. No pertinent past medical history.  History reviewed. No pertinent surgical history.  No family history on file.  Social History:  reports that he has never smoked. He has never used smokeless tobacco. He reports that he drinks alcohol. His drug history is not on file.  No Known Allergies  MEDICATIONS:                                                                                                                   Current Meds  Medication Sig  . amLODipine (NORVASC) 10 MG tablet Take 10 mg by mouth daily.  .  cloNIDine (CATAPRES) 0.1 MG tablet Take 0.1 mg by mouth at bedtime.  Marland Kitchen CREON 24000-76000 units CPEP Take 24,000 Units by mouth 3 (three) times daily.   Marland Kitchen imatinib (GLEEVEC) 100 MG tablet Take 200 mg by mouth daily. Take with meals and large  glass of water.Caution:Chemotherapy  . losartan-hydrochlorothiazide (HYZAAR) 100-25 MG tablet Take 1 tablet by mouth daily.  . magnesium oxide (MAG-OX) 400 MG tablet Take 400 mg by mouth daily.  . simvastatin (ZOCOR) 20 MG tablet Take 20 mg by mouth at bedtime.  . VENTOLIN HFA 108 (90 Base) MCG/ACT inhaler INHALE 2 PUFFS 4 TIMES DAILY AS NEEDED FOR SHORTNESS OF BREATH  . VIIBRYD 40 MG TABS Take 40 mg by mouth daily.  . [DISCONTINUED] imatinib (GLEEVEC) 400 MG tablet Take 200 mg by mouth daily. Take with meals and large glass of water.Caution:Chemotherapy.      Review Of Systems:                                                                                                           History obtained from unobtainable from patient due to mental status   Blood pressure (!) 149/79, pulse 78, temperature 99.1 F (37.3 C), temperature source Oral, resp. rate (!) 24, height 6' (1.829 m), weight 69.2 kg (152 lb 9.6 oz), SpO2 96 %.   Physical Examination:                                                                                                      General: WDWN male. He is unable to answer any questions. HEENT:  Normocephalic, no lesions, without obvious abnormality.  Normal external eye and conjunctiva. Normal external ears. Normal external nose, mucus membranes and septum.  Normal pharynx. Cardiovascular: S1, S2 normal. Edema of the hands bilaterally   Pulmonary: tachypnea is noted, harsh breath sounds noted diffusely throughout both lungs Abdomen: Soft, non-tender\ Extremities: Right AKA. Bilateral hand edema.  severe pain with any joint manipulation Lymphatic: no adenopathy palpable Musculoskeletal: As above Skin: warm and dry, no hyperpigmentation,  vitiligo, or suspicious lesions  Neurological Examination:                                                                                               Mental Status: [name] rouses to loud voices, not oriented. Vocalizes to stimulation and mutters phrases  Without dysarthria  Cranial Nerves: II: He blinks to threat from the left reliably, unclear if he blinks on the right Pupils are equal, round, reactive to light. III,IV, VI:  He crosses midline in both directions V,VII: Grimaces to pain. Face is symmetric. Facial light touch and pinprick sensation intact bilaterally VIII: Hearing grossly intact to loud voices IX,X: Uvula and palate rise symmetrically XI: Moves head left and right (~20 degrees) without trouble  XII: Unable to assess Motor: I see him twitches fingers and toes, but he does not make any movements of his arms or legs even to noxious stimuli Sensory: Intact to painful stimulus and any movement of the joints Deep Tendon Reflexes: 0 throughout BUE and LLE Pathologic reflexes: Plantars: Left: static Cerebellar: Unable to assess   Lab Results: Basic Metabolic Panel:  Recent Labs Lab 09/24/16 1303 09/25/16 0335 09/26/16 0247 09/27/16 0040 09/28/16 0346 09/29/16 0210 09/30/16 0301  NA 140 140 140 140 146* 147* 149*  K 3.0* 3.3* 3.2* 3.2* 3.2* 3.1* 3.6  CL 110 109 111 113* 116* 116* 114*  CO2 24 21* 19* 21* 20* 22 25  GLUCOSE 126* 122* 109* 124* 145* 163* 138*  BUN 8 8 10 14 18 17  26*  CREATININE 0.95 1.11 1.20 1.23 1.29* 1.25* 1.27*  CALCIUM 8.3* 8.3* 8.3* 8.3* 8.4* 8.5* 8.8*  MG 1.8 1.6* 2.0  --   --  1.7 2.3  PHOS 2.9 2.9 2.1*  --   --  2.2*  --     Liver Function Tests:  Recent Labs Lab 09/26/16 0247  AST 32  ALT 22  ALKPHOS 72  BILITOT 1.6*  PROT 5.2*  ALBUMIN 2.4*    Recent Labs Lab 09/30/16 0900  LIPASE 21   No results for input(s): AMMONIA in the last 168 hours.  CBC:  Recent Labs Lab 09/25/16 1425 09/26/16 0247 09/27/16 0040  09/28/16 0346 09/29/16 0210  WBC 11.8* 15.6* 17.0* 12.1* 12.9*  NEUTROABS  --  12.5*  --   --   --   HGB 9.8* 9.8* 9.2* 8.6* 8.5*  HCT 30.5* 30.0* 27.6* 26.7* 26.4*  MCV 83.1 82.6 82.9 83.4 83.0  PLT 163 142* 152 QUESTIONABLE RESULTS, RECOMMEND RECOLLECT TO VERIFY 209    Cardiac Enzymes:  Recent Labs Lab 09/29/16 1020  CKTOTAL 139    Lipid Panel: No results for input(s): CHOL, TRIG, HDL, CHOLHDL, VLDL, LDLCALC in the last 168 hours.  CBG:  Recent Labs Lab 09/29/16 0405 09/29/16 1948 09/29/16 2330 09/30/16 0328 09/30/16 1136  GLUCAP 129* 133* 150* 147* 127*    Microbiology: Results for orders placed or performed during the hospital encounter of 09/20/16  MRSA PCR Screening     Status: None   Collection Time: 09/21/16  3:17 AM  Result Value Ref Range Status   MRSA by PCR NEGATIVE NEGATIVE Final    Comment:        The GeneXpert MRSA Assay (FDA approved for NASAL specimens only), is one component of a comprehensive MRSA colonization surveillance program. It is not intended to diagnose MRSA infection nor to guide or monitor treatment for MRSA infections.   Culture, blood (Routine X 2) w Reflex to ID Panel     Status: None (Preliminary result)   Collection Time: 09/25/16  8:23 PM  Result Value Ref Range Status   Specimen Description BLOOD LEFT HAND  Final   Special Requests IN PEDIATRIC BOTTLE Blood Culture adequate volume  Final   Culture NO GROWTH 4 DAYS  Final   Report Status PENDING  Incomplete  Culture, blood (Routine X 2) w Reflex to ID Panel     Status: Abnormal   Collection Time: 09/25/16  8:24  PM  Result Value Ref Range Status   Specimen Description BLOOD RIGHT HAND  Final   Special Requests   Final    BOTTLES DRAWN AEROBIC ONLY Blood Culture adequate volume   Culture  Setup Time   Final    GRAM POSITIVE COCCI IN CLUSTERS AEROBIC BOTTLE ONLY CRITICAL RESULT CALLED TO, READ BACK BY AND VERIFIED WITH: K AMEND PHARMD 2207 09/26/16 A BROWNING     Culture (A)  Final    STAPHYLOCOCCUS SPECIES (COAGULASE NEGATIVE) THE SIGNIFICANCE OF ISOLATING THIS ORGANISM FROM A SINGLE SET OF BLOOD CULTURES WHEN MULTIPLE SETS ARE DRAWN IS UNCERTAIN. PLEASE NOTIFY THE MICROBIOLOGY DEPARTMENT WITHIN ONE WEEK IF SPECIATION AND SENSITIVITIES ARE REQUIRED.    Report Status 09/27/2016 FINAL  Final  Blood Culture ID Panel (Reflexed)     Status: Abnormal   Collection Time: 09/25/16  8:24 PM  Result Value Ref Range Status   Enterococcus species NOT DETECTED NOT DETECTED Final   Listeria monocytogenes NOT DETECTED NOT DETECTED Final   Staphylococcus species DETECTED (A) NOT DETECTED Final    Comment: Methicillin (oxacillin) susceptible coagulase negative staphylococcus. Possible blood culture contaminant (unless isolated from more than one blood culture draw or clinical case suggests pathogenicity). No antibiotic treatment is indicated for blood  culture contaminants. CRITICAL RESULT CALLED TO, READ BACK BY AND VERIFIED WITH: K AMEND PHARMD 2207 09/26/16 A BROWNING    Staphylococcus aureus NOT DETECTED NOT DETECTED Final   Methicillin resistance NOT DETECTED NOT DETECTED Final   Streptococcus species NOT DETECTED NOT DETECTED Final   Streptococcus agalactiae NOT DETECTED NOT DETECTED Final   Streptococcus pneumoniae NOT DETECTED NOT DETECTED Final   Streptococcus pyogenes NOT DETECTED NOT DETECTED Final   Acinetobacter baumannii NOT DETECTED NOT DETECTED Final   Enterobacteriaceae species NOT DETECTED NOT DETECTED Final   Enterobacter cloacae complex NOT DETECTED NOT DETECTED Final   Escherichia coli NOT DETECTED NOT DETECTED Final   Klebsiella oxytoca NOT DETECTED NOT DETECTED Final   Klebsiella pneumoniae NOT DETECTED NOT DETECTED Final   Proteus species NOT DETECTED NOT DETECTED Final   Serratia marcescens NOT DETECTED NOT DETECTED Final   Haemophilus influenzae NOT DETECTED NOT DETECTED Final   Neisseria meningitidis NOT DETECTED NOT DETECTED Final    Pseudomonas aeruginosa NOT DETECTED NOT DETECTED Final   Candida albicans NOT DETECTED NOT DETECTED Final   Candida glabrata NOT DETECTED NOT DETECTED Final   Candida krusei NOT DETECTED NOT DETECTED Final   Candida parapsilosis NOT DETECTED NOT DETECTED Final   Candida tropicalis NOT DETECTED NOT DETECTED Final  Culture, Urine     Status: Abnormal   Collection Time: 09/25/16  8:54 PM  Result Value Ref Range Status   Specimen Description URINE, CATHETERIZED  Final   Special Requests NONE  Final   Culture >=100,000 COLONIES/mL ENTEROCOCCUS FAECALIS (A)  Final   Report Status 09/28/2016 FINAL  Final   Organism ID, Bacteria ENTEROCOCCUS FAECALIS (A)  Final      Susceptibility   Enterococcus faecalis - MIC*    AMPICILLIN <=2 SENSITIVE Sensitive     LEVOFLOXACIN 1 SENSITIVE Sensitive     NITROFURANTOIN <=16 SENSITIVE Sensitive     VANCOMYCIN 1 SENSITIVE Sensitive     * >=100,000 COLONIES/mL ENTEROCOCCUS FAECALIS  Culture, expectorated sputum-assessment     Status: None   Collection Time: 09/30/16 11:42 AM  Result Value Ref Range Status   Specimen Description SPUTUM  Final   Special Requests NONE  Final   Sputum evaluation   Final  Sputum specimen not acceptable for testing.  Please recollect.   Gram Stain Report Called to,Read Back By and Verified With: RN CONTY [id number]    Report Status 09/30/2016 FINAL  Final    Coagulation Studies: No results for input(s): LABPROT, INR in the last 72 hours.  Imaging: Dg Abd 1 View  Result Date: 09/30/2016 CLINICAL DATA:  Feeding tube placement. EXAM: ABDOMEN - 1 VIEW COMPARISON:  CT scan 09/20/2016 FINDINGS: The feeding tube tip is in the region of the duodenum bulb. Scattered air in the small bowel and colon likely a mild ileus. IMPRESSION: Feeding tube tip in the region of the duodenum bulb. Electronically Signed   By: Marijo Sanes M.D.   On: 09/30/2016 11:37   Dg Chest Port 1 View  Result Date: 09/30/2016 CLINICAL DATA:  Acute  respiratory failure EXAM: PORTABLE CHEST 1 VIEW COMPARISON:  09/29/2016 FINDINGS: Elevation of the right hemidiaphragm, stable. No confluent airspace opacities. Tortuosity of the thoracic aorta. Heart is borderline in size. No visible effusions. IMPRESSION: Elevated right hemidiaphragm.  No active disease. Electronically Signed   By: Rolm Baptise M.D.   On: 09/30/2016 07:17   Dg Chest Port 1 View  Result Date: 09/29/2016 CLINICAL DATA:  71 year old male with a history of respiratory failure EXAM: PORTABLE CHEST 1 VIEW COMPARISON:  09/28/2016, 09/27/2016, 09/25/2016 FINDINGS: Cardiomediastinal silhouette unchanged in size and contour. Calcifications of the aortic arch. Tortuosity of the descending thoracic aorta. Similar appearance of low lung volumes with asymmetric elevation of the right hemidiaphragm. No pneumothorax or pleural effusion. No confluent airspace disease. Likely atelectasis at the right base. Unchanged enteric tube terminating out of the field of view. Surgical changes of the cervical region incompletely imaged. IMPRESSION: Similar appearance of the chest x-ray with low lung volumes an asymmetric elevation the right hemidiaphragm with likely associated atelectasis. Unchanged position of the enteric tube. Electronically Signed   By: Corrie Mckusick D.O.   On: 09/29/2016 07:51   Dg Chest Port 1 View  Result Date: 09/28/2016 CLINICAL DATA:  Acute respiratory failure EXAM: PORTABLE CHEST 1 VIEW COMPARISON:  09/28/2016 FINDINGS: Shallow inspiration with elevation of the right hemidiaphragm. Linear atelectasis in the lung bases, greater on the right. No airspace disease in the lungs. No blunting of costophrenic angles. No pneumothorax. Heart size and pulmonary vascularity are normal. Calcification of the aorta. Colonic interposition under the right hemidiaphragm. Enteric tube tip is off the field of view below the left hemidiaphragm. IMPRESSION: Shallow inspiration with elevation of right  hemidiaphragm and linear atelectasis in the lung bases. Electronically Signed   By: Lucienne Capers M.D.   On: 09/28/2016 21:05     Thank you for consulting the [facility] team. Assessment and plan per attending neurologist.   Solon Augusta PA-C [facility]  09/30/2016, 12:36 PM  Assessment and Plan: 71 year old male with persistent encephalopathy,? EtOH abuse. It is possible that this represents continued gram-negative encephalopathy given that he has persistent fevers. I would favor continuing to look for source. Given the pain with any manipulation of his joints, I would be concerned for possible septic arthritis. He will need further imaging of his brain and I will include imaging of his cervical spine as well given that he does not move either arm or legs much.  1) MRI brain, cervical spine 2) ammonia, TSH 3) may need lumbar puncture depending on MRI results  4) neurology will continue to follow  Roland Rack, MD [facility] [phone]  If 7pm- 7am,  please page neurology on call as listed in [place].

## 2016-09-30 NOTE — Progress Notes (Signed)
Text paged provider on call and asked if we could stop IV fluids since patient is receiving tube feeds, BP has been very elevated, crackles heard in lung sounds, and patient has anasarca/swelling in upper extremities. Dr. Myna Hidalgo discontinued IV fluids.

## 2016-09-30 NOTE — Progress Notes (Signed)
IV team consulted and after spending a long time she was able to get one IV in his left upper arm. Patient has very poor access options and would benefit from a PICC line.

## 2016-09-30 NOTE — Progress Notes (Addendum)
Nutrition Follow-up  DOCUMENTATION CODES:   Not applicable  INTERVENTION:   Continue Jevity 1.2 @ 65 ml/hr via cortrak tube and increase by 10 ml every 4 hours to goal rate of 70 ml/hr.   Tube feeding regimen provides 2016 kcal (100% of needs), 93 grams of protein, and 1361 ml of H2O (2261 ml with inclusion of free water flush regimen).   NUTRITION DIAGNOSIS:   Inadequate oral intake related to inability to eat as evidenced by NPO status.  Ongoing  GOAL:   Patient will meet greater than or equal to 90% of their needs  Met with TF  MONITOR:   TF tolerance, Labs, I & O's  REASON FOR ASSESSMENT:   Consult, Rounds (verbal consult during rounds) Enteral/tube feeding initiation and management  ASSESSMENT:   71 year old male with a past medical history significant for alcohol abuse was admitted on 09/21/2016 with acute encephalopathy in the setting of alcohol withdrawal. He required intubation for airway protection.   5/23- transferred from IDU to SDU 5/24- s/p BSE, revealed moderate aspiration risk; recommend remain NPO related to fluctuating mentation and respiratory status  Per SLP, plan to remain NPO today due to somnolence.   Pt very somnolent at time of visit. He did not arise when name was called or during exam.   Nutrition-Focused physical exam completed. Findings are mild (orbital) fat depletion, mild to moderate (patella, thigh, and calf) muscle depletion, and moderate edema. Pt with significant edema on upper extremities and suspect may be masking weight changes and true fat and muscle depletion. Given rt AKA noted on exam, unsure of mobility status PTA.   Pt remains on TF via post-pyloric cortrak tube- Jevity 1.2 is currently infusing at 65 ml/hr, which is providing 1872 kcals, 87 grams protein, and 1259 ml fluid (2159 total with inclusion on 150 ml free water flush q 4 hours regimen). Currently meeting 100% of estimated kcal needs and 97% of estimated protein  needs).   Labs reviewed: Na: 149, CBGS: 147-150.   Diet Order:   NPO  Skin:  Reviewed, no issues  Last BM:  09/29/16  Height:   Ht Readings from Last 1 Encounters:  09/21/16 6' (1.829 m)    Weight:   Wt Readings from Last 1 Encounters:  09/30/16 152 lb 9.6 oz (69.2 kg)    Ideal Body Weight:  74.4 kg (adjusted for amputation)  BMI:  Body mass index is 20.7 kg/m.  Estimated Nutritional Needs:   Kcal:  1850-2050  Protein:  90-100 gm  Fluid:  1.8-2 L  EDUCATION NEEDS:   No education needs identified at this time  Larisa Lanius A. Jimmye Norman, RD, LDN, CDE Pager: [phone] After hours Pager: [phone]

## 2016-10-01 ENCOUNTER — Inpatient Hospital Stay (HOSPITAL_COMMUNITY): Payer: Medicare Other

## 2016-10-01 ENCOUNTER — Other Ambulatory Visit (HOSPITAL_COMMUNITY): Payer: Medicaid Other

## 2016-10-01 DIAGNOSIS — M1030 Gout due to renal impairment, unspecified site: Secondary | ICD-10-CM

## 2016-10-01 DIAGNOSIS — N390 Urinary tract infection, site not specified: Secondary | ICD-10-CM

## 2016-10-01 DIAGNOSIS — E860 Dehydration: Secondary | ICD-10-CM

## 2016-10-01 DIAGNOSIS — I5030 Unspecified diastolic (congestive) heart failure: Secondary | ICD-10-CM

## 2016-10-01 DIAGNOSIS — B952 Enterococcus as the cause of diseases classified elsewhere: Secondary | ICD-10-CM

## 2016-10-01 DIAGNOSIS — D72829 Elevated white blood cell count, unspecified: Secondary | ICD-10-CM

## 2016-10-01 LAB — CBC WITH DIFFERENTIAL/PLATELET
Basophils Absolute: 0 10*3/uL (ref 0.0–0.1)
Basophils Relative: 0 %
EOS ABS: 0 10*3/uL (ref 0.0–0.7)
Eosinophils Relative: 0 %
HCT: 23.2 % — ABNORMAL LOW (ref 39.0–52.0)
Hemoglobin: 7.5 g/dL — ABNORMAL LOW (ref 13.0–17.0)
LYMPHS ABS: 0.6 10*3/uL — AB (ref 0.7–4.0)
LYMPHS PCT: 3 %
MCH: 26.6 pg (ref 26.0–34.0)
MCHC: 32.3 g/dL (ref 30.0–36.0)
MCV: 82.3 fL (ref 78.0–100.0)
MONOS PCT: 4 %
Monocytes Absolute: 0.8 10*3/uL (ref 0.1–1.0)
Neutro Abs: 17.6 10*3/uL — ABNORMAL HIGH (ref 1.7–7.7)
Neutrophils Relative %: 93 %
PLATELETS: 252 10*3/uL (ref 150–400)
RBC: 2.82 MIL/uL — AB (ref 4.22–5.81)
RDW: 16.5 % — ABNORMAL HIGH (ref 11.5–15.5)
WBC: 19 10*3/uL — AB (ref 4.0–10.5)

## 2016-10-01 LAB — AMMONIA: AMMONIA: 18 umol/L (ref 9–35)

## 2016-10-01 LAB — BASIC METABOLIC PANEL
ANION GAP: 9 (ref 5–15)
BUN: 30 mg/dL — AB (ref 6–20)
CALCIUM: 8.5 mg/dL — AB (ref 8.9–10.3)
CO2: 25 mmol/L (ref 22–32)
Chloride: 112 mmol/L — ABNORMAL HIGH (ref 101–111)
Creatinine, Ser: 1.38 mg/dL — ABNORMAL HIGH (ref 0.61–1.24)
GFR calc Af Amer: 58 mL/min — ABNORMAL LOW (ref >60)
GFR, EST NON AFRICAN AMERICAN: 50 mL/min — AB (ref >60)
GLUCOSE: 178 mg/dL — AB (ref 65–99)
Potassium: 3.3 mmol/L — ABNORMAL LOW (ref 3.5–5.1)
Sodium: 146 mmol/L — ABNORMAL HIGH (ref 135–145)

## 2016-10-01 LAB — GLUCOSE, CAPILLARY
GLUCOSE-CAPILLARY: 164 mg/dL — AB (ref 65–99)
Glucose-Capillary: 181 mg/dL — ABNORMAL HIGH (ref 65–99)
Glucose-Capillary: 173 mg/dL — ABNORMAL HIGH (ref 65–99)
Glucose-Capillary: 189 mg/dL — ABNORMAL HIGH (ref 65–99)

## 2016-10-01 LAB — MAGNESIUM: Magnesium: 2.1 mg/dL (ref 1.7–2.4)

## 2016-10-01 LAB — LIPASE, BLOOD: Lipase: 18 U/L (ref 11–51)

## 2016-10-01 LAB — TSH: TSH: 0.267 u[IU]/mL — ABNORMAL LOW (ref 0.350–4.500)

## 2016-10-01 LAB — T4, FREE: Free T4: 0.77 ng/dL (ref 0.61–1.12)

## 2016-10-01 MED ORDER — LORAZEPAM 2 MG/ML IJ SOLN
0.5000 mg | INTRAMUSCULAR | Status: DC | PRN
  Administered 2016-10-01 – 2016-10-02 (×3): 0.5 mg via INTRAVENOUS
  Filled 2016-10-01 (×3): qty 1

## 2016-10-01 MED ORDER — IMATINIB MESYLATE 100 MG PO TABS
200.0000 mg | ORAL_TABLET | Freq: Every day | ORAL | Status: DC
  Filled 2016-10-01: qty 2

## 2016-10-01 MED ORDER — IPRATROPIUM-ALBUTEROL 0.5-2.5 (3) MG/3ML IN SOLN
3.0000 mL | Freq: Three times a day (TID) | RESPIRATORY_TRACT | Status: DC
  Administered 2016-10-01 – 2016-10-04 (×11): 3 mL via RESPIRATORY_TRACT
  Filled 2016-10-01 (×12): qty 3

## 2016-10-01 MED ORDER — AMLODIPINE BESYLATE 10 MG PO TABS
10.0000 mg | ORAL_TABLET | Freq: Every day | ORAL | Status: DC
  Administered 2016-10-02 – 2016-10-07 (×6): 10 mg via ORAL
  Filled 2016-10-01 (×6): qty 1

## 2016-10-01 MED ORDER — IMATINIB MESYLATE 100 MG PO TABS: 200.0000 mg | ORAL_TABLET | Freq: Every day | ORAL | Status: DC

## 2016-10-01 MED ORDER — LOPERAMIDE HCL 2 MG PO CAPS
4.0000 mg | ORAL_CAPSULE | Freq: Once | ORAL | Status: AC
  Administered 2016-10-01: 4 mg via ORAL
  Filled 2016-10-01: qty 2

## 2016-10-01 MED ORDER — PANCRELIPASE (LIP-PROT-AMYL) 12000-38000 UNITS PO CPEP
24000.0000 [IU] | ORAL_CAPSULE | Freq: Three times a day (TID) | ORAL | Status: DC
  Administered 2016-10-02 – 2016-10-07 (×15): 24000 [IU] via ORAL
  Filled 2016-10-01 (×15): qty 2

## 2016-10-01 MED ORDER — ENOXAPARIN SODIUM 40 MG/0.4ML ~~LOC~~ SOLN
40.0000 mg | SUBCUTANEOUS | Status: DC
  Administered 2016-10-03 – 2016-10-07 (×5): 40 mg via SUBCUTANEOUS
  Filled 2016-10-01 (×5): qty 0.4

## 2016-10-01 MED ORDER — FOLIC ACID 1 MG PO TABS
1.0000 mg | ORAL_TABLET | Freq: Every day | ORAL | Status: DC
  Administered 2016-10-02 – 2016-10-07 (×6): 1 mg via ORAL
  Filled 2016-10-01 (×6): qty 1

## 2016-10-01 MED ORDER — POTASSIUM CHLORIDE 10 MEQ/100ML IV SOLN
10.0000 meq | INTRAVENOUS | Status: AC
  Administered 2016-10-01: 10 meq via INTRAVENOUS
  Filled 2016-10-01: qty 100

## 2016-10-01 MED ORDER — HALOPERIDOL LACTATE 5 MG/ML IJ SOLN
5.0000 mg | Freq: Four times a day (QID) | INTRAMUSCULAR | Status: DC | PRN
  Administered 2016-10-01: 5 mg via INTRAVENOUS
  Filled 2016-10-01 (×3): qty 1

## 2016-10-01 MED ORDER — COLCHICINE 0.6 MG PO TABS
0.6000 mg | ORAL_TABLET | Freq: Every day | ORAL | Status: DC
  Administered 2016-10-01 – 2016-10-02 (×2): 0.6 mg via ORAL
  Filled 2016-10-01 (×2): qty 1

## 2016-10-01 MED ORDER — VITAMIN B-1 100 MG PO TABS
100.0000 mg | ORAL_TABLET | Freq: Every day | ORAL | Status: DC
  Administered 2016-10-02 – 2016-10-07 (×6): 100 mg via ORAL
  Filled 2016-10-01 (×6): qty 1

## 2016-10-01 MED ORDER — METOPROLOL TARTRATE 100 MG PO TABS
100.0000 mg | ORAL_TABLET | Freq: Two times a day (BID) | ORAL | Status: DC
  Administered 2016-10-01 – 2016-10-06 (×11): 100 mg via ORAL
  Filled 2016-10-01 (×2): qty 2
  Filled 2016-10-01 (×4): qty 1
  Filled 2016-10-01: qty 2
  Filled 2016-10-01: qty 1
  Filled 2016-10-01 (×4): qty 2

## 2016-10-01 MED ORDER — POTASSIUM CHLORIDE 2 MEQ/ML IV SOLN
INTRAVENOUS | Status: AC
  Administered 2016-10-01 – 2016-10-04 (×3): via INTRAVENOUS
  Filled 2016-10-01 (×7): qty 1000

## 2016-10-01 MED ORDER — OXYCODONE HCL 5 MG PO TABS
5.0000 mg | ORAL_TABLET | ORAL | Status: DC | PRN
  Administered 2016-10-01 – 2016-10-05 (×13): 5 mg via ORAL
  Filled 2016-10-01 (×13): qty 1

## 2016-10-01 MED ORDER — ACETAMINOPHEN 160 MG/5ML PO SOLN
650.0000 mg | Freq: Four times a day (QID) | ORAL | Status: DC | PRN
Start: 2016-10-01 — End: 2016-10-02

## 2016-10-01 MED ORDER — AMLODIPINE BESYLATE 10 MG PO TABS
10.0000 mg | ORAL_TABLET | Freq: Every day | ORAL | Status: DC
  Administered 2016-10-01: 10 mg
  Filled 2016-10-01: qty 1

## 2016-10-01 MED ORDER — VILAZODONE HCL 40 MG PO TABS
40.0000 mg | ORAL_TABLET | Freq: Every day | ORAL | Status: DC
  Administered 2016-10-02 – 2016-10-07 (×6): 40 mg via ORAL
  Filled 2016-10-01 (×6): qty 1

## 2016-10-01 NOTE — Progress Notes (Signed)
EEG completed; results pending.    

## 2016-10-01 NOTE — Progress Notes (Addendum)
Subjective: Markedly improved.  He states that his joint pains are typical of his gout flares.  Exam: Vitals:   10/01/16 1224 10/01/16 1706  BP: (!) 151/116 (!) 159/111  Pulse: (!) 49 88  Resp: 20 18  Temp: 99 F (37.2 C) 98.8 F (37.1 C)   Gen: In bed, NAD Resp: non-labored breathing, no acute distress Abd: soft, nt  Neuro: MS: Awake, alert, answers questions readily. He is not oriented CN: Pupils equal round and reactive to light, extra to the movements intact, visual fields full Motor: He follows commands in all 4 shoulders, but markedly limited due to pain Sensory: Endorses symmetric sensation  Pertinent Labs: BMP-unremarkable  Impression: 71 year old male with encephalopathy in the setting of gram-negative infection and gout flare. His marked improvement would argue against CNS infection, however I still think an LP would be prudent. Unfortunately, he received Lovenox earlier this morning and therefore I will have to wait until tomorrow.  Recommendations: 1) continue plan to hold Lovenox for LP 2) continue treatment of his underlying medical issues  Roland Rack, MD [facility] [phone]  If 7pm- 7am, please page neurology on call as listed in [place].

## 2016-10-01 NOTE — Progress Notes (Signed)
PROGRESS NOTE    Clare Casto  VQM:[id number] DOB: 07-13-1945 DOA: 09/20/2016 PCP: Patient, No Pcp Per   Brief Narrative:  71 year old male was admitted for unresponsiveness and acute metabolic encephalopathy likely secondary to alcohol withdrawal. He was intubated for airway protection and placed on Precedex drip which was later weaned off. During this time was also ongoing enterococcal UTI and aspiration pneumonitis therefore he was on Zosyn. He was eventually extubated and transferred to stepdown unit for further management  Assessment & Plan:   Principal Problem:   Acute and chronic respiratory failure (acute-on-chronic) ([place]) Active Problems:   Respiratory failure with hypoxia (HCC)   Respiratory distress   Cellulitis of upper extremity   Hypernatremia   Dehydration   Acute gout   Severe depression (HCC)   Metabolic encephalopathy   Hypokalemia   Chronic pancreatitis (HCC)   Exhausted vascular access   Leukocytosis   Enterococcus UTI  NOTE: PATIENT HAS ANOTHER CHART IN [facility] THAT NEEDS TO BE MERGED WITH CURRENT RECORDS I REVIEWED PREVIOUS CHART AND RECORDS IN [facility].  Acute/chronic metabolic encephalopathy with intermittent agitation -Continue folic acid and thiamine -Continue neurochecks, slow clinical improvement noted -Ativan and haldol IV as needed for agitation --Pt still encephalopathic, little mental improvement, discussed with family, this is not his baseline, he apparently has history of severe depression, possible withdrawal from his psychiatric medications - MRI 5/26 pending - Neurology consult 5/26 may need LP  Acute respiratory distress with hypoxia -Off BiPAP but now having shallow breathing and wheezing with poor air movement, - Ordered STAT duoneb, schedule duonebs every 4 hours, restart bipap as needed, IV solumedrol ordered 5/25 -CPK appears to be in normal range. May need ABG checked -Pulmonary following -Minimize narcotic use.    -Chest x-ray from 5/24 shows similar appearance with low lung volumes and asymmetric elevation of right hemidiaphragm  Persistent fevers - check renal US to rule out renal abscess, blood cultures NGTD, neuro considering LP, CXR no pneumonia findings  Acute Gouty Arthritis / Bilateral upper extremity swelling Acute Gouty arthritis -Upper extremity ultrasound is negative for DVT -Improving with IV steroids.  Chronic pancreatitis - Pt not on his pancreatic enzymes with tube feeding, lipase normal  Chronic Myeloid Leukemia - He is followed by Dr. [name] and is taking oral chemotherapy for this outpatient.  On hold now as he is not able to take oral medication.    Enterococcus UTI - Zosyn, check renal US  Acute Kidney Injury - likely prerenal as he has free water deficit, increasing free water and repeat BMP in AM.   Concerns of aspiration pneumonia -Continue Zosyn. Awaiting SLP evaluation.   Hypernatremia - continue D5W infusion.  He has total body free water deficit, will order small fluid bolus of D5W and increase free water to 150 ml every 4 hours, repeat BMP in AM  Hypokalemia - replete IV.  COPD and elevated right hemidiaphragm -Added steroids and scheduled nebs 5/25, bipap as needed  Poor vascular access - ordered PICC line 5/25  Severe depression - will need to get him back on his medication as soon as he can take oral meds and likely get psychiatry consult when he is more alert and able to participate in discussions.   Anemia of chronic disease -Monitor hemoglobin, type and screen  Malignant difficult to control Hypertension -Increased metoprolol to 100 mg twice daily. Continue other medications including amlodipine, clonidine. Add hydralazine as needed   DVT prophylaxis: Lovenox Code Status: Full  Family Communication:  Girlfriend and niece at bedside.  Disposition Plan: SDU  Consultants:   Pulm  Neurology  Procedures:   None  Antimicrobials:   Zosyn  5/20 >>  Vanc 5/20 > 5/23  Subjective: Pt encephalopathic and febrile but is vocalizing more and able to answer some questions.   Objective: Vitals:   10/01/16 0400 10/01/16 0415 10/01/16 0500 10/01/16 0600  BP: (!) 184/93 (!) 184/93 (!) 166/89 (!) 163/90  Pulse: 81  89 87  Resp: (!) 23  (!) 21 (!) 23  Temp: (!) 101.2 F (38.4 C)     TempSrc: Axillary     SpO2: 96%  97% 95%  Weight:   70.5 kg (155 lb 6.8 oz)   Height:        Intake/Output Summary (Last 24 hours) at 10/01/16 0812 Last data filed at 10/01/16 0600  Gross per 24 hour  Intake          2021.33 ml  Output             1450 ml  Net           571.33 ml   Filed Weights   09/29/16 0312 09/30/16 0316 10/01/16 0500  Weight: 68 kg (149 lb 14.4 oz) 69.2 kg (152 lb 9.6 oz) 70.5 kg (155 lb 6.8 oz)    Examination:  General exam: confused, encephalopathic, OG tube in place Respiratory system: shallow breathing with basilar bilateral exp wheezes heard. Cardiovascular system: S1 & S2 heard, RRR. No JVD, murmurs, rubs, gallops or clicks. No pedal edema. Gastrointestinal system: Abdomen is nondistended, soft and nontender. No organomegaly or masses felt. Normal bowel sounds heard. Central nervous system: Alert and oriented. No focal neurological deficits. Extremities:  bilateral upper extremity swelling wrist and hands Skin: No rashes, lesions or ulcers Psychiatry: encephalopathic  Data Reviewed:   CBC:  Recent Labs Lab 09/26/16 0247 09/27/16 0040 09/28/16 0346 09/29/16 0210 10/01/16 0231  WBC 15.6* 17.0* 12.1* 12.9* 19.0*  NEUTROABS 12.5*  --   --   --  17.6*  HGB 9.8* 9.2* 8.6* 8.5* 7.5*  HCT 30.0* 27.6* 26.7* 26.4* 23.2*  MCV 82.6 82.9 83.4 83.0 82.3  PLT 142* 152 QUESTIONABLE RESULTS, RECOMMEND RECOLLECT TO VERIFY 209 371   Basic Metabolic Panel:  Recent Labs Lab 09/24/16 1303 09/25/16 0335 09/26/16 0247  09/28/16 0346 09/29/16 0210 09/30/16 0301 09/30/16 2222 10/01/16 0231  NA 140 140 140  < >  146* 147* 149* 145 146*  K 3.0* 3.3* 3.2*  < > 3.2* 3.1* 3.6 3.4* 3.3*  CL 110 109 111  < > 116* 116* 114* 111 112*  CO2 24 21* 19*  < > 20* 22 25 23 25   GLUCOSE 126* 122* 109*  < > 145* 163* 138* 203* 178*  BUN 8 8 10   < > 18 17 26* 31* 30*  CREATININE 0.95 1.11 1.20  < > 1.29* 1.25* 1.27* 1.40* 1.38*  CALCIUM 8.3* 8.3* 8.3*  < > 8.4* 8.5* 8.8* 8.5* 8.5*  MG 1.8 1.6* 2.0  --   --  1.7 2.3  --  2.1  PHOS 2.9 2.9 2.1*  --   --  2.2*  --   --   --   < > = values in this interval not displayed. GFR: Estimated Creatinine Clearance: 49 mL/min (A) (by C-G formula based on SCr of 1.38 mg/dL (H)). Liver Function Tests:  Recent Labs Lab 09/26/16 0247  AST 32  ALT 22  ALKPHOS 72  BILITOT 1.6*  PROT 5.2*  ALBUMIN 2.4*    Recent Labs Lab 09/30/16 0900 10/01/16 0231  LIPASE 21 18   No results for input(s): AMMONIA in the last 168 hours. Coagulation Profile:  Recent Labs Lab 09/26/16 0247  INR 1.25   Cardiac Enzymes:  Recent Labs Lab 09/29/16 1020  CKTOTAL 139   BNP (last 3 results) No results for input(s): PROBNP in the last 8760 hours. HbA1C: No results for input(s): HGBA1C in the last 72 hours. CBG:  Recent Labs Lab 09/29/16 0405 09/29/16 1948 09/29/16 2330 09/30/16 0328 09/30/16 1136  GLUCAP 129* 133* 150* 147* 127*   Lipid Profile: No results for input(s): CHOL, HDL, LDLCALC, TRIG, CHOLHDL, LDLDIRECT in the last 72 hours. Thyroid Function Tests: No results for input(s): TSH, T4TOTAL, FREET4, T3FREE, THYROIDAB in the last 72 hours. Anemia Panel: No results for input(s): VITAMINB12, FOLATE, FERRITIN, TIBC, IRON, RETICCTPCT in the last 72 hours. Sepsis Labs:  Recent Labs Lab 09/27/16 1021 09/27/16 1302 09/28/16 0346 09/29/16 0210  PROCALCITON 4.88  --  3.86 2.86  LATICACIDVEN 0.8 0.8  --   --     Recent Results (from the past 240 hour(s))  Culture, blood (Routine X 2) w Reflex to ID Panel     Status: None   Collection Time: 09/25/16  8:23 PM    Result Value Ref Range Status   Specimen Description BLOOD LEFT HAND  Final   Special Requests IN PEDIATRIC BOTTLE Blood Culture adequate volume  Final   Culture NO GROWTH 5 DAYS  Final   Report Status 09/30/2016 FINAL  Final  Culture, blood (Routine X 2) w Reflex to ID Panel     Status: Abnormal   Collection Time: 09/25/16  8:24 PM  Result Value Ref Range Status   Specimen Description BLOOD RIGHT HAND  Final   Special Requests   Final    BOTTLES DRAWN AEROBIC ONLY Blood Culture adequate volume   Culture  Setup Time   Final    GRAM POSITIVE COCCI IN CLUSTERS AEROBIC BOTTLE ONLY CRITICAL RESULT CALLED TO, READ BACK BY AND VERIFIED WITH: K AMEND PHARMD 2207 09/26/16 A BROWNING    Culture (A)  Final    STAPHYLOCOCCUS SPECIES (COAGULASE NEGATIVE) THE SIGNIFICANCE OF ISOLATING THIS ORGANISM FROM A SINGLE SET OF BLOOD CULTURES WHEN MULTIPLE SETS ARE DRAWN IS UNCERTAIN. PLEASE NOTIFY THE MICROBIOLOGY DEPARTMENT WITHIN ONE WEEK IF SPECIATION AND SENSITIVITIES ARE REQUIRED.    Report Status 09/27/2016 FINAL  Final  Blood Culture ID Panel (Reflexed)     Status: Abnormal   Collection Time: 09/25/16  8:24 PM  Result Value Ref Range Status   Enterococcus species NOT DETECTED NOT DETECTED Final   Listeria monocytogenes NOT DETECTED NOT DETECTED Final   Staphylococcus species DETECTED (A) NOT DETECTED Final    Comment: Methicillin (oxacillin) susceptible coagulase negative staphylococcus. Possible blood culture contaminant (unless isolated from more than one blood culture draw or clinical case suggests pathogenicity). No antibiotic treatment is indicated for blood  culture contaminants. CRITICAL RESULT CALLED TO, READ BACK BY AND VERIFIED WITH: K AMEND PHARMD 2207 09/26/16 A BROWNING    Staphylococcus aureus NOT DETECTED NOT DETECTED Final   Methicillin resistance NOT DETECTED NOT DETECTED Final   Streptococcus species NOT DETECTED NOT DETECTED Final   Streptococcus agalactiae NOT DETECTED NOT  DETECTED Final   Streptococcus pneumoniae NOT DETECTED NOT DETECTED Final   Streptococcus pyogenes NOT DETECTED NOT DETECTED Final   Acinetobacter baumannii NOT DETECTED NOT DETECTED Final   Enterobacteriaceae species  NOT DETECTED NOT DETECTED Final   Enterobacter cloacae complex NOT DETECTED NOT DETECTED Final   Escherichia coli NOT DETECTED NOT DETECTED Final   Klebsiella oxytoca NOT DETECTED NOT DETECTED Final   Klebsiella pneumoniae NOT DETECTED NOT DETECTED Final   Proteus species NOT DETECTED NOT DETECTED Final   Serratia marcescens NOT DETECTED NOT DETECTED Final   Haemophilus influenzae NOT DETECTED NOT DETECTED Final   Neisseria meningitidis NOT DETECTED NOT DETECTED Final   Pseudomonas aeruginosa NOT DETECTED NOT DETECTED Final   Candida albicans NOT DETECTED NOT DETECTED Final   Candida glabrata NOT DETECTED NOT DETECTED Final   Candida krusei NOT DETECTED NOT DETECTED Final   Candida parapsilosis NOT DETECTED NOT DETECTED Final   Candida tropicalis NOT DETECTED NOT DETECTED Final  Culture, Urine     Status: Abnormal   Collection Time: 09/25/16  8:54 PM  Result Value Ref Range Status   Specimen Description URINE, CATHETERIZED  Final   Special Requests NONE  Final   Culture >=100,000 COLONIES/mL ENTEROCOCCUS FAECALIS (A)  Final   Report Status 09/28/2016 FINAL  Final   Organism ID, Bacteria ENTEROCOCCUS FAECALIS (A)  Final      Susceptibility   Enterococcus faecalis - MIC*    AMPICILLIN <=2 SENSITIVE Sensitive     LEVOFLOXACIN 1 SENSITIVE Sensitive     NITROFURANTOIN <=16 SENSITIVE Sensitive     VANCOMYCIN 1 SENSITIVE Sensitive     * >=100,000 COLONIES/mL ENTEROCOCCUS FAECALIS  Culture, blood (routine x 2)     Status: None (Preliminary result)   Collection Time: 09/28/16  9:40 PM  Result Value Ref Range Status   Specimen Description BLOOD LEFT HAND  Final   Special Requests   Final    BOTTLES DRAWN AEROBIC AND ANAEROBIC Blood Culture adequate volume   Culture NO  GROWTH 1 DAY  Final   Report Status PENDING  Incomplete  Culture, blood (routine x 2)     Status: None (Preliminary result)   Collection Time: 09/28/16  9:45 PM  Result Value Ref Range Status   Specimen Description BLOOD RIGHT HAND  Final   Special Requests IN PEDIATRIC BOTTLE Blood Culture adequate volume  Final   Culture NO GROWTH 1 DAY  Final   Report Status PENDING  Incomplete  Culture, expectorated sputum-assessment     Status: None   Collection Time: 09/30/16 11:42 AM  Result Value Ref Range Status   Specimen Description SPUTUM  Final   Special Requests NONE  Final   Sputum evaluation   Final    Sputum specimen not acceptable for testing.  Please recollect.   Gram Stain Report Called to,Read Back By and Verified With: RN CONTY [id number]    Report Status 09/30/2016 FINAL  Final    Radiology Studies: Dg Abd 1 View  Result Date: 09/30/2016 CLINICAL DATA:  Feeding tube placement. EXAM: ABDOMEN - 1 VIEW COMPARISON:  CT scan 09/20/2016 FINDINGS: The feeding tube tip is in the region of the duodenum bulb. Scattered air in the small bowel and colon likely a mild ileus. IMPRESSION: Feeding tube tip in the region of the duodenum bulb. Electronically Signed   By: Marijo Sanes M.D.   On: 09/30/2016 11:37   US Renal  Result Date: 09/30/2016 CLINICAL DATA:  UTI with fever EXAM: RENAL / URINARY TRACT ULTRASOUND COMPLETE COMPARISON:  CT 09/20/2016 FINDINGS: Right Kidney: Length: 10.2 cm. Echogenicity within normal limits. No mass or hydronephrosis visualized. Left Kidney: Length: 10.5 cm. Echogenicity within normal limits. No  mass or hydronephrosis visualized. Trace left perinephric fluid. Bladder: Appears normal for degree of bladder distention. IMPRESSION: 1. No hydronephrosis or focal abnormality within the kidneys 2. Trace left perinephric fluid Electronically Signed   By: Donavan Foil M.D.   On: 09/30/2016 21:40   Dg Chest Port 1 View  Result Date: 10/01/2016 CLINICAL DATA:  Patient  was admitted after being found unconscious. EXAM: PORTABLE CHEST 1 VIEW COMPARISON:  Sep 30, 2016 FINDINGS: The feeding tube terminates below today's film. No pneumothorax. An elevated right hemidiaphragm is stable. The cardiomediastinal silhouette including a torturous thoracic aorta is stable. The lungs are clear with no focal infiltrate, nodule, or mass. IMPRESSION: No acute interval change. Electronically Signed   By: Dorise Bullion III M.D   On: 10/01/2016 07:05   Dg Chest Port 1 View  Result Date: 09/30/2016 CLINICAL DATA:  Acute respiratory failure EXAM: PORTABLE CHEST 1 VIEW COMPARISON:  09/29/2016 FINDINGS: Elevation of the right hemidiaphragm, stable. No confluent airspace opacities. Tortuosity of the thoracic aorta. Heart is borderline in size. No visible effusions. IMPRESSION: Elevated right hemidiaphragm.  No active disease. Electronically Signed   By: Rolm Baptise M.D.   On: 09/30/2016 07:17   Scheduled Meds: . amLODipine  10 mg Per Tube Daily  . chlorhexidine  15 mL Mouth Rinse BID  . cloNIDine  0.3 mg Transdermal Weekly  . enoxaparin (LOVENOX) injection  40 mg Subcutaneous Q24H  . folic acid  1 mg Per Tube Daily  . free water  150 mL Per Tube Q4H  . gabapentin  200 mg Per Tube Q8H  . ipratropium-albuterol  3 mL Nebulization TID  . mouth rinse  15 mL Mouth Rinse q12n4p  . methylPREDNISolone (SOLU-MEDROL) injection  60 mg Intravenous Q6H  . metoprolol tartrate  100 mg Per Tube BID  . thiamine  100 mg Per Tube Daily   Continuous Infusions: . dextrose 50 mL/hr at 09/30/16 1108  . feeding supplement (JEVITY 1.2 CAL) 1,000 mL (09/30/16 2312)  . piperacillin-tazobactam (ZOSYN)  IV 3.375 g (10/01/16 0532)  . potassium chloride       LOS: 11 days   Critical Care Time spent: 40 mins   Irwin Brakeman, MD [facility] Pager [phone]  If 7PM-7AM, please contact night-coverage [email or web address] Password [email or web address] 10/01/2016, 8:12 AM

## 2016-10-01 NOTE — Progress Notes (Signed)
NIV wasn't place on patient due to agitation. Pt is stable at this time. No increase WOB noted. SATs are stable

## 2016-10-01 NOTE — Progress Notes (Signed)
PT Cancellation Note  Patient Details Name: [name] MRN: [id number] DOB: 1945/06/14   Cancelled Treatment:    Reason Eval/Treat Not Completed: Patient not medically ready;Pain limiting ability to participate. Per RN, pt is in excruciating pain from gout. Requests PT hold for today. Will check back tomorrow as time allows.    Scheryl Marten PT, DPT  [phone]  10/01/2016, 1:48 PM

## 2016-10-01 NOTE — Procedures (Signed)
History:  70 year old male with altered mental status  Sedation: Lorazepam given much earlier.  Technique: This is a 21 channel routine scalp EEG performed at the bedside with bipolar and monopolar montages arranged in accordance to the international 10/20 system of electrode placement. One channel was dedicated to EKG recording.    Background: At the onset of recording, the patient is sleeping with symmetrically distributed sleep spindles. Following awakening, there is a posterior dominant rhythm of 8 Hz that is seen symmetrically. There is some excess beta activity. There is some mild generalized irregular delta and theta activity. No epileptiform discharges were seen.  Photic stimulation: Physiologic driving is not performed  EEG Abnormalities: 1) generalized irregular slow activity  Clinical Interpretation: This EEG is consistent with a mild generalized non-specific cerebral dysfunction(encephalopathy). There was no seizure or seizure predisposition recorded on this study. Please note that a normal EEG does not preclude the possibility of epilepsy.   Roland Rack, MD [facility] [phone]  If 7pm- 7am, please page neurology on call as listed in [place].

## 2016-10-01 NOTE — Progress Notes (Signed)
Spoke with [name], RN regarding PICC.  Patient currently with 2 PIVs and getting all medications without issue.  Okay to wait on PICC.  [name], RN will notify IV team if this changes and patient will be reprioritized.  Carolee Rota, RN [facility]AST

## 2016-10-01 NOTE — Progress Notes (Signed)
  Echocardiogram 2D Echocardiogram has been performed.  Donata Clay 10/01/2016, 6:20 PM

## 2016-10-01 NOTE — Progress Notes (Signed)
Pt not available for EEG at this time, MRI to be done first per RN.

## 2016-10-01 NOTE — Progress Notes (Signed)
Patient is currently on [facility] with sats of 98%. Patient is resting comfortably and all vital are stable. BIPAP is in room on standby but is not needed at this time. Will continue to monitor.
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
## 2016-10-02 LAB — CBC WITH DIFFERENTIAL/PLATELET
BASOS ABS: 0 10*3/uL (ref 0.0–0.1)
Basophils Relative: 0 %
EOS ABS: 0 10*3/uL (ref 0.0–0.7)
Eosinophils Relative: 0 %
HEMATOCRIT: 23.2 % — AB (ref 39.0–52.0)
HEMOGLOBIN: 7.6 g/dL — AB (ref 13.0–17.0)
LYMPHS ABS: 1.1 10*3/uL (ref 0.7–4.0)
LYMPHS PCT: 5 %
MCH: 27 pg (ref 26.0–34.0)
MCHC: 32.8 g/dL (ref 30.0–36.0)
MCV: 82.3 fL (ref 78.0–100.0)
MONOS PCT: 6 %
Monocytes Absolute: 1.3 10*3/uL — ABNORMAL HIGH (ref 0.1–1.0)
NEUTROS ABS: 19.6 10*3/uL — AB (ref 1.7–7.7)
Neutrophils Relative %: 89 %
Platelets: 305 10*3/uL (ref 150–400)
RBC: 2.82 MIL/uL — AB (ref 4.22–5.81)
RDW: 16.5 % — AB (ref 11.5–15.5)
WBC: 22 10*3/uL — AB (ref 4.0–10.5)

## 2016-10-02 LAB — COMPREHENSIVE METABOLIC PANEL
ALBUMIN: 1.9 g/dL — AB (ref 3.5–5.0)
ALT: 44 U/L (ref 17–63)
ANION GAP: 9 (ref 5–15)
AST: 46 U/L — AB (ref 15–41)
Alkaline Phosphatase: 78 U/L (ref 38–126)
BUN: 29 mg/dL — AB (ref 6–20)
CO2: 25 mmol/L (ref 22–32)
Calcium: 8.5 mg/dL — ABNORMAL LOW (ref 8.9–10.3)
Chloride: 111 mmol/L (ref 101–111)
Creatinine, Ser: 1.25 mg/dL — ABNORMAL HIGH (ref 0.61–1.24)
GFR calc Af Amer: >60 mL/min (ref >60)
GFR calc non Af Amer: 56 mL/min — ABNORMAL LOW (ref >60)
GLUCOSE: 139 mg/dL — AB (ref 65–99)
POTASSIUM: 3.3 mmol/L — AB (ref 3.5–5.1)
SODIUM: 145 mmol/L (ref 135–145)
Total Bilirubin: 0.3 mg/dL (ref 0.3–1.2)
Total Protein: 5.2 g/dL — ABNORMAL LOW (ref 6.5–8.1)

## 2016-10-02 LAB — C-REACTIVE PROTEIN
CRP: 12 mg/dL — ABNORMAL HIGH (ref <1.0)
CRP: 12 mg/dL — AB (ref <1.0)

## 2016-10-02 LAB — SEDIMENTATION RATE: Sed Rate: 132 mm/hr — ABNORMAL HIGH (ref 0–16)

## 2016-10-02 LAB — ECHOCARDIOGRAM COMPLETE
Height: 72 in
Weight: 2486.79 oz

## 2016-10-02 LAB — GLUCOSE, CAPILLARY
GLUCOSE-CAPILLARY: 128 mg/dL — AB (ref 65–99)
GLUCOSE-CAPILLARY: 113 mg/dL — AB (ref 65–99)
GLUCOSE-CAPILLARY: 101 mg/dL — AB (ref 65–99)
Glucose-Capillary: 138 mg/dL — ABNORMAL HIGH (ref 65–99)

## 2016-10-02 LAB — MAGNESIUM: MAGNESIUM: 2 mg/dL (ref 1.7–2.4)

## 2016-10-02 MED ORDER — HYDRALAZINE HCL 25 MG PO TABS
25.0000 mg | ORAL_TABLET | Freq: Three times a day (TID) | ORAL | Status: DC
  Administered 2016-10-02 – 2016-10-03 (×3): 25 mg via ORAL
  Filled 2016-10-02 (×3): qty 1

## 2016-10-02 MED ORDER — NICOTINE 21 MG/24HR TD PT24
21.0000 mg | MEDICATED_PATCH | Freq: Every day | TRANSDERMAL | Status: DC
  Administered 2016-10-02 – 2016-10-07 (×6): 21 mg via TRANSDERMAL
  Filled 2016-10-02 (×6): qty 1

## 2016-10-02 MED ORDER — DIPHENOXYLATE-ATROPINE 2.5-0.025 MG PO TABS
2.0000 | ORAL_TABLET | Freq: Four times a day (QID) | ORAL | Status: DC | PRN
  Administered 2016-10-02 – 2016-10-04 (×2): 2 via ORAL
  Filled 2016-10-02 (×2): qty 2

## 2016-10-02 MED ORDER — POTASSIUM CHLORIDE CRYS ER 20 MEQ PO TBCR
40.0000 meq | EXTENDED_RELEASE_TABLET | Freq: Once | ORAL | Status: AC
  Administered 2016-10-02: 40 meq via ORAL
  Filled 2016-10-02: qty 2

## 2016-10-02 MED ORDER — ACETAMINOPHEN 325 MG PO TABS: 650.0000 mg | ORAL_TABLET | Freq: Four times a day (QID) | ORAL | Status: DC | PRN

## 2016-10-02 NOTE — Progress Notes (Signed)
Patient is currently on RA with sats of 100%. All vitals are stable and patient is resting comfortably. BIPAP is in room on standby but is not needed at this time. Will continue to monitor.

## 2016-10-02 NOTE — Progress Notes (Addendum)
Subjective: Continues to improve. Denies headache. Fevers downtrending.   Exam: Vitals:   10/02/16 0300 10/02/16 0737  BP: (!) 182/100 (!) 170/112  Pulse: 79 82  Resp: 20 20  Temp: 100.2 F (37.9 C) 98.7 F (37.1 C)   Gen: In bed, NAD Resp: non-labored breathing, no acute distress Abd: soft, nt  Neuro: MS: Awake, alert, answers questions readily. He is oriented today to month, place.  CN: Pupils equal round and reactive to light, extra ocular movements intact, visual fields full Motor: He follows commands in all 4 shoulders, but markedly limited due to pain Sensory: Endorses symmetric sensation  Pertinent Labs: BMP-unremarkable  Impression: 71 year old male with encephalopathy in the setting of gram-negative infection and gout flare. I suspect delirium. His marked improvement would argue against CNS infection. With continued improvement, no meningismus or headache, downtrending fever curve I think the likelihood of meningitis is low. With the degree of joint pain that he is having, I think that doing an LP would be excruciating for him and with the fact that it is very unlikley to change management at this point makes me think we should hold off.   If his mental status were to re-worsen, or his fever curve were to trend back up wihtout explanation, then this might need to be re-visited.   Recommendations: 1) continue treatment of underlying medical issues.  2) Please call with further questions or concerns.    Roland Rack, MD [facility] [phone]  If 7pm- 7am, please page neurology on call as listed in [place].

## 2016-10-02 NOTE — Progress Notes (Signed)
PT Cancellation Note  Patient Details Name: [name] MRN: [id number] DOB: March 15, 1946   Cancelled Treatment:    Reason Eval/Treat Not Completed: Patient not medically ready;Pain limiting ability to participate   Discussed pt status with his nurse, [name], RN; Gout pain continues to limit ability to participate; Family to bring in his prosthesis;   Will check back for appropriateness of PT eval tomorrow;   [name], [place] Pager [phone] Office [phone]    [name] 10/02/2016, 1:36 PM

## 2016-10-02 NOTE — Progress Notes (Signed)
Patient has had two bowel movements since 1900. First one was very soft and the second one was very watery. Patient states he takes Imodium at home 5-6 times a day d/t his Leukemia/ Oral Chemo medications. Neither of these stools smelled or looked like C-diff. Called [facility] provider on call and received an order for Imodium 4 mg X 1 dose. Will need to be evaluated in am whether to continue. Previous shift reported patient had 3 BM's on day shift as well. Patient was taking Creon at home as well to help with his Pancreatitis per patient. Will continue to monitor.
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
## 2016-10-03 DIAGNOSIS — F322 Major depressive disorder, single episode, severe without psychotic features: Secondary | ICD-10-CM

## 2016-10-03 DIAGNOSIS — K861 Other chronic pancreatitis: Secondary | ICD-10-CM

## 2016-10-03 DIAGNOSIS — M7021 Olecranon bursitis, right elbow: Secondary | ICD-10-CM

## 2016-10-03 LAB — CBC WITH DIFFERENTIAL/PLATELET
Basophils Absolute: 0.2 10*3/uL — ABNORMAL HIGH (ref 0.0–0.1)
Basophils Relative: 1 %
EOS ABS: 0 10*3/uL (ref 0.0–0.7)
Eosinophils Relative: 0 %
HEMATOCRIT: 26.3 % — AB (ref 39.0–52.0)
Hemoglobin: 8.5 g/dL — ABNORMAL LOW (ref 13.0–17.0)
LYMPHS ABS: 1.7 10*3/uL (ref 0.7–4.0)
Lymphocytes Relative: 10 %
MCH: 26.5 pg (ref 26.0–34.0)
MCHC: 32.3 g/dL (ref 30.0–36.0)
MCV: 81.9 fL (ref 78.0–100.0)
Monocytes Absolute: 1.9 10*3/uL — ABNORMAL HIGH (ref 0.1–1.0)
Monocytes Relative: 11 %
NEUTROS PCT: 78 %
Neutro Abs: 13.6 10*3/uL — ABNORMAL HIGH (ref 1.7–7.7)
PLATELETS: 375 10*3/uL (ref 150–400)
RBC: 3.21 MIL/uL — AB (ref 4.22–5.81)
RDW: 16.5 % — AB (ref 11.5–15.5)
WBC: 17.4 10*3/uL — AB (ref 4.0–10.5)

## 2016-10-03 LAB — COMPREHENSIVE METABOLIC PANEL
ALBUMIN: 1.9 g/dL — AB (ref 3.5–5.0)
ALT: 63 U/L (ref 17–63)
ANION GAP: 8 (ref 5–15)
AST: 53 U/L — ABNORMAL HIGH (ref 15–41)
Alkaline Phosphatase: 82 U/L (ref 38–126)
BILIRUBIN TOTAL: 0.6 mg/dL (ref 0.3–1.2)
BUN: 26 mg/dL — ABNORMAL HIGH (ref 6–20)
CHLORIDE: 112 mmol/L — AB (ref 101–111)
CO2: 24 mmol/L (ref 22–32)
Calcium: 8.7 mg/dL — ABNORMAL LOW (ref 8.9–10.3)
Creatinine, Ser: 1.3 mg/dL — ABNORMAL HIGH (ref 0.61–1.24)
GFR calc Af Amer: >60 mL/min (ref >60)
GFR, EST NON AFRICAN AMERICAN: 54 mL/min — AB (ref >60)
Glucose, Bld: 120 mg/dL — ABNORMAL HIGH (ref 65–99)
POTASSIUM: 3.7 mmol/L (ref 3.5–5.1)
Sodium: 144 mmol/L (ref 135–145)
TOTAL PROTEIN: 5.4 g/dL — AB (ref 6.5–8.1)

## 2016-10-03 LAB — GLUCOSE, CAPILLARY
GLUCOSE-CAPILLARY: 121 mg/dL — AB (ref 65–99)
Glucose-Capillary: 152 mg/dL — ABNORMAL HIGH (ref 65–99)
Glucose-Capillary: 251 mg/dL — ABNORMAL HIGH (ref 65–99)

## 2016-10-03 MED ORDER — PREDNISONE 50 MG PO TABS
50.0000 mg | ORAL_TABLET | Freq: Every day | ORAL | Status: DC
  Filled 2016-10-03: qty 1

## 2016-10-03 MED ORDER — COLCHICINE 0.6 MG PO TABS
1.2000 mg | ORAL_TABLET | Freq: Every day | ORAL | Status: DC
  Administered 2016-10-03 – 2016-10-07 (×5): 1.2 mg via ORAL
  Filled 2016-10-03 (×5): qty 2

## 2016-10-03 MED ORDER — IMATINIB MESYLATE 100 MG PO TABS
200.0000 mg | ORAL_TABLET | Freq: Every day | ORAL | Status: DC
  Filled 2016-10-03 (×2): qty 2

## 2016-10-03 MED ORDER — HYDRALAZINE HCL 50 MG PO TABS: 50.0000 mg | ORAL_TABLET | Freq: Three times a day (TID) | ORAL | Status: DC

## 2016-10-03 MED ORDER — HYDRALAZINE HCL 25 MG PO TABS
75.0000 mg | ORAL_TABLET | Freq: Three times a day (TID) | ORAL | Status: DC
  Administered 2016-10-03 – 2016-10-07 (×13): 75 mg via ORAL
  Filled 2016-10-03 (×13): qty 1

## 2016-10-03 MED ORDER — METHYLPREDNISOLONE SODIUM SUCC 125 MG IJ SOLR
125.0000 mg | Freq: Once | INTRAMUSCULAR | Status: AC
  Administered 2016-10-03: 125 mg via INTRAVENOUS
  Filled 2016-10-03: qty 2

## 2016-10-03 MED ORDER — ACETAMINOPHEN 325 MG PO TABS
650.0000 mg | ORAL_TABLET | Freq: Four times a day (QID) | ORAL | Status: DC
  Administered 2016-10-03 – 2016-10-06 (×13): 650 mg via ORAL
  Filled 2016-10-03 (×13): qty 2

## 2016-10-03 NOTE — Progress Notes (Addendum)
SLP Cancellation Note  Patient Details Name: [name] MRN: [id number] DOB: 22-Apr-1946   Cancelled treatment:       Reason Eval/Treat Not Completed: Fatigue/lethargy limiting ability to participate . RN reports pt is tolerating current PO diet, but is sleep deprived. Requested pt remain sleeping today. SLP will f/u tomorrow.   [place] Agent, Katherene Ponto 10/03/2016, 12:00 PM
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
## 2016-10-03 NOTE — Progress Notes (Signed)
PT Cancellation Note  Patient Details Name: [name] MRN: [id number] DOB: February 01, 1946   Cancelled Treatment:    Reason Eval/Treat Not Completed: Patient not medically ready;Pain limiting ability to participate. Patient with sininficant gout flare up at this time and has had difficulty sleeping. Currently patient is resting comfortable per nsg and has asked to hold at this time. Will try back as able   [name] Dull 10/03/2016, 11:59 AM Alben Deeds, PT DPT  [phone]
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
## 2016-10-04 ENCOUNTER — Encounter (HOSPITAL_COMMUNITY): Payer: Self-pay | Admitting: Family Medicine

## 2016-10-04 LAB — SYNOVIAL CELL COUNT + DIFF, W/ CRYSTALS
Eosinophils-Synovial: NONE SEEN % (ref 0–1)
LYMPHOCYTES-SYNOVIAL FLD: 1 % (ref 0–20)
MONOCYTE-MACROPHAGE-SYNOVIAL FLUID: NONE SEEN % (ref 50–90)
Neutrophil, Synovial: 99 % — ABNORMAL HIGH (ref 0–25)
WBC, Synovial: 8450 /mm3 — ABNORMAL HIGH (ref 0–200)

## 2016-10-04 LAB — CULTURE, BLOOD (ROUTINE X 2)
CULTURE: NO GROWTH
CULTURE: NO GROWTH
SPECIAL REQUESTS: ADEQUATE
Special Requests: ADEQUATE

## 2016-10-04 LAB — GLUCOSE, CAPILLARY: Glucose-Capillary: 132 mg/dL — ABNORMAL HIGH (ref 65–99)

## 2016-10-04 LAB — RPR: RPR Ser Ql: NONREACTIVE

## 2016-10-04 MED ORDER — LIDOCAINE HCL 2 % IJ SOLN
5.0000 mL | Freq: Once | INTRAMUSCULAR | Status: DC
  Filled 2016-10-04: qty 10

## 2016-10-04 MED ORDER — LIDOCAINE HCL (PF) 1 % IJ SOLN
INTRAMUSCULAR | Status: AC
  Filled 2016-10-04: qty 5

## 2016-10-04 MED ORDER — INSULIN ASPART 100 UNIT/ML ~~LOC~~ SOLN
0.0000 [IU] | Freq: Three times a day (TID) | SUBCUTANEOUS | Status: DC
Start: 2016-10-04 — End: 2016-10-07
  Administered 2016-10-04: 8 [IU] via SUBCUTANEOUS
  Administered 2016-10-05: 1 [IU] via SUBCUTANEOUS
  Administered 2016-10-05: 2 [IU] via SUBCUTANEOUS
  Administered 2016-10-06: 5 [IU] via SUBCUTANEOUS
  Administered 2016-10-07: 2 [IU] via SUBCUTANEOUS

## 2016-10-04 MED ORDER — INSULIN ASPART 100 UNIT/ML ~~LOC~~ SOLN
3.0000 [IU] | Freq: Three times a day (TID) | SUBCUTANEOUS | Status: DC
  Administered 2016-10-04 – 2016-10-07 (×7): 3 [IU] via SUBCUTANEOUS

## 2016-10-04 MED ORDER — BUPIVACAINE HCL (PF) 0.5 % IJ SOLN
10.0000 mL | Freq: Once | INTRAMUSCULAR | Status: DC
  Filled 2016-10-04: qty 10

## 2016-10-04 MED ORDER — PREDNISONE 50 MG PO TABS
60.0000 mg | ORAL_TABLET | Freq: Every day | ORAL | Status: DC
  Administered 2016-10-04 – 2016-10-07 (×4): 60 mg via ORAL
  Filled 2016-10-04 (×4): qty 1

## 2016-10-04 MED ORDER — METHYLPREDNISOLONE ACETATE 40 MG/ML IJ SUSP
40.0000 mg | Freq: Once | INTRAMUSCULAR | Status: DC
  Filled 2016-10-04: qty 1
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
## 2016-10-04 NOTE — Progress Notes (Signed)
SLP Cancellation Note  Patient Details Name: [name] MRN: [id number] DOB: 07/27/1945   Cancelled treatment:       Reason Eval/Treat Not Completed: Other (comment)- NPO for procedure   [name] 10/04/2016, 12:18 PM

## 2016-10-04 NOTE — Consult Note (Signed)
Reason for Consult:Gout Referring Physician: C Isaak [name] is an 71 y.o. male.  HPI: [name] was admitted to the hospital 1/85 with metabolic encephalopathy. While here he has had a flare of his gout. As usual it has affected his bilateral upper extremities. He has developed a significant olecranon bursitis on the right. Oral steroids and his usual gout medication have not helped him. His  attending requested orthopedic surgical consultation for aspiration to confirm diagnosis. He says he has to get the fluid drained from the bursa 2-3x/year. He sees [name] for his orthopedic care.  History reviewed. No pertinent past medical history.  History reviewed. No pertinent surgical history.  No family history on file.  Social History:  reports that he has never smoked. He has never used smokeless tobacco. He reports that he drinks alcohol. His drug history is not on file.  Allergies: No Known Allergies  Medications: I have reviewed the patient's current medications.  Results for orders placed or performed during the hospital encounter of 09/20/16 (from the past 48 hour(s))  RPR     Status: None   Collection Time: 10/02/16 10:09 AM  Result Value Ref Range   RPR Ser Ql Non Reactive Non Reactive    Comment: (NOTE) Performed At: [facility], [place], [place] [id number] Lindon Romp MD VZ:[id number]   Glucose, capillary     Status: Abnormal   Collection Time: 10/02/16  3:52 PM  Result Value Ref Range   Glucose-Capillary 101 (H) 65 - 99 mg/dL  Glucose, capillary     Status: Abnormal   Collection Time: 10/02/16  9:28 PM  Result Value Ref Range   Glucose-Capillary 138 (H) 65 - 99 mg/dL  CBC with Differential/Platelet     Status: Abnormal   Collection Time: 10/03/16  3:52 AM  Result Value Ref Range   WBC 17.4 (H) 4.0 - 10.5 K/uL   RBC 3.21 (L) 4.22 - 5.81 MIL/uL   Hemoglobin 8.5 (L) 13.0 - 17.0 g/dL   HCT 26.3 (L) 39.0 - 52.0 %   MCV 81.9 78.0 - 100.0  fL   MCH 26.5 26.0 - 34.0 pg   MCHC 32.3 30.0 - 36.0 g/dL   RDW 16.5 (H) 11.5 - 15.5 %   Platelets 375 150 - 400 K/uL   Neutrophils Relative % 78 %   Lymphocytes Relative 10 %   Monocytes Relative 11 %   Eosinophils Relative 0 %   Basophils Relative 1 %   Neutro Abs 13.6 (H) 1.7 - 7.7 K/uL   Lymphs Abs 1.7 0.7 - 4.0 K/uL   Monocytes Absolute 1.9 (H) 0.1 - 1.0 K/uL   Eosinophils Absolute 0.0 0.0 - 0.7 K/uL   Basophils Absolute 0.2 (H) 0.0 - 0.1 K/uL   RBC Morphology POLYCHROMASIA PRESENT    WBC Morphology MILD LEFT SHIFT (1-5% METAS, OCC MYELO, OCC BANDS)   Comprehensive metabolic panel     Status: Abnormal   Collection Time: 10/03/16  3:52 AM  Result Value Ref Range   Sodium 144 135 - 145 mmol/L   Potassium 3.7 3.5 - 5.1 mmol/L   Chloride 112 (H) 101 - 111 mmol/L   CO2 24 22 - 32 mmol/L   Glucose, Bld 120 (H) 65 - 99 mg/dL   BUN 26 (H) 6 - 20 mg/dL   Creatinine, Ser 1.30 (H) 0.61 - 1.24 mg/dL   Calcium 8.7 (L) 8.9 - 10.3 mg/dL   Total Protein 5.4 (L) 6.5 - 8.1 g/dL  Albumin 1.9 (L) 3.5 - 5.0 g/dL   AST 53 (H) 15 - 41 U/L   ALT 63 17 - 63 U/L   Alkaline Phosphatase 82 38 - 126 U/L   Total Bilirubin 0.6 0.3 - 1.2 mg/dL   GFR calc non Af Amer 54 (L) >60 mL/min   GFR calc Af Amer >60 >60 mL/min    Comment: (NOTE) The eGFR has been calculated using the CKD EPI equation. This calculation has not been validated in all clinical situations. eGFR's persistently <60 mL/min signify possible Chronic Kidney Disease.    Anion gap 8 5 - 15  Glucose, capillary     Status: Abnormal   Collection Time: 10/03/16  7:37 AM  Result Value Ref Range   Glucose-Capillary 121 (H) 65 - 99 mg/dL   Comment 1 Notify RN    Comment 2 Document in Chart   Glucose, capillary     Status: Abnormal   Collection Time: 10/03/16 12:54 PM  Result Value Ref Range   Glucose-Capillary 152 (H) 65 - 99 mg/dL   Comment 1 Notify RN    Comment 2 Document in Chart   Glucose, capillary     Status: Abnormal    Collection Time: 10/03/16  5:00 PM  Result Value Ref Range   Glucose-Capillary 251 (H) 65 - 99 mg/dL    No results found.  Review of Systems  Constitutional: Negative for weight loss.  HENT: Negative for ear discharge, ear pain, hearing loss and tinnitus.   Eyes: Negative for blurred vision, double vision, photophobia and pain.  Respiratory: Negative for cough, sputum production and shortness of breath.   Cardiovascular: Negative for chest pain.  Gastrointestinal: Negative for abdominal pain, nausea and vomiting.  Genitourinary: Negative for dysuria, flank pain, frequency and urgency.  Musculoskeletal: Positive for joint pain (BUE). Negative for back pain, falls, myalgias and neck pain.  Neurological: Negative for dizziness, tingling, sensory change, focal weakness, loss of consciousness and headaches.  Endo/Heme/Allergies: Does not bruise/bleed easily.  Psychiatric/Behavioral: Negative for depression, memory loss and substance abuse. The patient is not nervous/anxious.    Blood pressure (!) 141/85, pulse 77, temperature 98.4 F (36.9 C), temperature source Oral, resp. rate 14, height 6' (1.829 m), weight 70.1 kg (154 lb 8.7 oz), SpO2 97 %. Physical Exam  Constitutional: He appears well-developed and well-nourished. No distress.  HENT:  Head: Normocephalic.  Eyes: Conjunctivae are normal. Right eye exhibits no discharge. Left eye exhibits no discharge. No scleral icterus.  Cardiovascular: Normal rate and regular rhythm.   Respiratory: Effort normal. No respiratory distress.  Musculoskeletal:  Right shoulder, elbow, wrist, digits- no skin wounds, TTP elbows and distal, no instability, olecranon bursa swollen, elbow flex/ext limited by pain  Sens  Ax/R/M/U intact  Mot   Ax/ R/ PIN/ M/ AIN/ U intact  Rad 2+  Left shoulder, elbow, wrist, digits- no skin wounds, TTP elbows and distal, no instability, elbow flex/ext limited by pain  Sens  Ax/R/M/U intact  Mot   Ax/ R/ PIN/ M/ AIN/ U  intact  Rad 2+  LLE No traumatic wounds, ecchymosis, or rash  Nontender  No effusions  Knee stable to varus/ valgus and anterior/posterior stress  Sens DPN, SPN, TN intact  Motor EHL, ext, flex, evers 5/5  DP 2+, PT 2+, No significant edema  Neurological: He is alert.  Skin: Skin is warm and dry. He is not diaphoretic.  Psychiatric: He has a normal mood and affect. His behavior is normal.    Assessment/Plan:  Gout -- Will aspirate and inject the right olecranon bursa. Suggest f/u with [facility] once discharged.    Bruce Abu, PA-C Orthopedic Surgery [phone] 10/04/2016, 9:26 AM

## 2016-10-04 NOTE — Procedures (Signed)
Procedure: Right olecranon bursa aspiration   Indication: Right olecranon bursa effusion(s)  Surgeon: Silvestre Gunner, PA-C  Assist: None  Anesthesia: None  EBL: None  Complications: None  Findings: After risks/benefits explained patient desires to undergo procedure. Consent obtained and time out performed. Right olecranon bursa was sterilely prepped and aspirated. About 40ml cloudy/bloody fluid obtained and was sent to the lab for stat GS, culture, and cell count. Pt made NPO. Pt tolerated the procedure well.    Lisette Abu, PA-C Orthopedic Surgery [phone]
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
## 2016-10-04 NOTE — Care Management Note (Signed)
Case Management Note  Patient Details  Name: Hashim Eichhorst MRN: [id number] Date of Birth: Aug 07, 1945  Subjective/Objective:  Pt admitted post being found down with ETOH                    Action/Plan:   Pt is intubated - no family at bedside.  CSW consulted for ETOH   Expected Discharge Date:                  Expected Discharge Plan:  Home/Self Care  In-House Referral:     Discharge planning Services  CM Consult  Post Acute Care Choice:    Choice offered to:     DME Arranged:    DME Agency:     HH Arranged:    HH Agency:     Status of Service:     If discussed at [employer], dates discussed:    Additional Comments: 10/04/2016 SNF recommended - CSW consulted  09/30/16 CM attempted to assess pt again today - however pt is not interactive.   Per bedside nurse pt has long time girlfriend Romaina.  CM contacted resource -Per girlfriend - PTA pt was completely independent.  CSW following for ETOH abuse.  CM requested PT/OT eval from attending when medically stable Maryclare Labrador, RN 10/04/2016, 9:48 AM

## 2016-10-04 NOTE — Progress Notes (Signed)
See cosigned  consult note cxs neg so far Gout affecting many joints Will follow will need surgery if cxs pos - gs neg from aspirate

## 2016-10-05 ENCOUNTER — Encounter (HOSPITAL_COMMUNITY): Payer: Self-pay | Admitting: General Practice

## 2016-10-05 DIAGNOSIS — M109 Gout, unspecified: Secondary | ICD-10-CM

## 2016-10-05 LAB — CBC WITH DIFFERENTIAL/PLATELET
BASOS ABS: 0 10*3/uL (ref 0.0–0.1)
Basophils Relative: 0 %
EOS PCT: 0 %
Eosinophils Absolute: 0 10*3/uL (ref 0.0–0.7)
HEMATOCRIT: 25.4 % — AB (ref 39.0–52.0)
Hemoglobin: 8.1 g/dL — ABNORMAL LOW (ref 13.0–17.0)
LYMPHS PCT: 8 %
Lymphs Abs: 1.4 10*3/uL (ref 0.7–4.0)
MCH: 26.2 pg (ref 26.0–34.0)
MCHC: 31.9 g/dL (ref 30.0–36.0)
MCV: 82.2 fL (ref 78.0–100.0)
MONO ABS: 1.2 10*3/uL — AB (ref 0.1–1.0)
MONOS PCT: 6 %
NEUTROS ABS: 15.9 10*3/uL — AB (ref 1.7–7.7)
Neutrophils Relative %: 86 %
PLATELETS: 470 10*3/uL — AB (ref 150–400)
RBC: 3.09 MIL/uL — ABNORMAL LOW (ref 4.22–5.81)
RDW: 17 % — AB (ref 11.5–15.5)
WBC: 18.5 10*3/uL — ABNORMAL HIGH (ref 4.0–10.5)

## 2016-10-05 LAB — GLUCOSE, CAPILLARY
GLUCOSE-CAPILLARY: 88 mg/dL (ref 65–99)
Glucose-Capillary: 122 mg/dL — ABNORMAL HIGH (ref 65–99)
Glucose-Capillary: 135 mg/dL — ABNORMAL HIGH (ref 65–99)
Glucose-Capillary: 157 mg/dL — ABNORMAL HIGH (ref 65–99)

## 2016-10-05 LAB — COMPREHENSIVE METABOLIC PANEL
ALT: 46 U/L (ref 17–63)
AST: 31 U/L (ref 15–41)
Albumin: 1.8 g/dL — ABNORMAL LOW (ref 3.5–5.0)
Alkaline Phosphatase: 69 U/L (ref 38–126)
Anion gap: 9 (ref 5–15)
BILIRUBIN TOTAL: 0.5 mg/dL (ref 0.3–1.2)
BUN: 37 mg/dL — ABNORMAL HIGH (ref 6–20)
CHLORIDE: 110 mmol/L (ref 101–111)
CO2: 22 mmol/L (ref 22–32)
Calcium: 8.4 mg/dL — ABNORMAL LOW (ref 8.9–10.3)
Creatinine, Ser: 1.23 mg/dL (ref 0.61–1.24)
GFR, EST NON AFRICAN AMERICAN: 57 mL/min — AB (ref >60)
Glucose, Bld: 101 mg/dL — ABNORMAL HIGH (ref 65–99)
POTASSIUM: 4 mmol/L (ref 3.5–5.1)
Sodium: 141 mmol/L (ref 135–145)
TOTAL PROTEIN: 7 g/dL (ref 6.5–8.1)

## 2016-10-05 MED ORDER — ENSURE ENLIVE PO LIQD
237.0000 mL | Freq: Three times a day (TID) | ORAL | Status: DC
  Administered 2016-10-05 – 2016-10-07 (×7): 237 mL via ORAL

## 2016-10-05 MED ORDER — ADULT MULTIVITAMIN W/MINERALS CH
1.0000 | ORAL_TABLET | Freq: Every day | ORAL | Status: DC
  Administered 2016-10-05 – 2016-10-07 (×3): 1 via ORAL
  Filled 2016-10-05 (×3): qty 1

## 2016-10-05 MED ORDER — OXYCODONE HCL 5 MG PO TABS
5.0000 mg | ORAL_TABLET | Freq: Four times a day (QID) | ORAL | Status: DC | PRN
  Administered 2016-10-05 – 2016-10-06 (×2): 5 mg via ORAL
  Filled 2016-10-05 (×2): qty 1

## 2016-10-05 MED ORDER — RESOURCE THICKENUP CLEAR PO POWD
ORAL | Status: DC | PRN
  Filled 2016-10-05: qty 125

## 2016-10-05 NOTE — NC FL2 (Signed)
[place] LEVEL OF CARE SCREENING TOOL     IDENTIFICATION  Patient Name: [name] Birthdate: 05-01-1946 Sex: male Admission Date (Current Location): 09/20/2016  [facility] and [place] Number:  Herbalist and Address:  [facility], [place] [address], [place], [place] [address]      Provider Number: [id number]  Attending Physician Name and Address:  Modena Jansky, MD  Relative Name and Phone Number:  [name], legal guardian    Current Level of Care: Hospital Recommended Level of Care: [place] Prior Approval Number:    Date Approved/Denied:   PASRR Number: [id number]  Discharge Plan: SNF    Current Diagnoses: Patient Active Problem List   Diagnosis Date Noted  . Hypernatremia 09/30/2016  . Dehydration 09/30/2016  . Acute gout 09/30/2016  . Severe depression ([place]) 09/30/2016  . Metabolic encephalopathy 92/33/0076  . Hypokalemia 09/30/2016  . Chronic pancreatitis ([place]) 09/30/2016  . Exhausted vascular access 09/30/2016  . Leukocytosis 09/30/2016  . Enterococcus UTI 09/30/2016  . Acute and chronic respiratory failure (acute-on-chronic) ([place])   . Cellulitis of upper extremity   . Respiratory distress   . Respiratory failure with hypoxia ([place]) 09/20/2016    Orientation RESPIRATION BLADDER Height & Weight     Self, Time, Situation, Place  Normal Incontinent, External catheter Weight: 65.2 kg (143 lb 11.8 oz) Height:  6' (182.9 cm)  BEHAVIORAL SYMPTOMS/MOOD NEUROLOGICAL BOWEL NUTRITION STATUS      Incontinent Diet (Please see DC Summary)  AMBULATORY STATUS COMMUNICATION OF NEEDS Skin   Extensive Assist Verbally Surgical wounds                       Personal Care Assistance Level of Assistance  Bathing, Feeding, Dressing Bathing Assistance: Maximum assistance Feeding assistance: Maximum assistance Dressing Assistance: Maximum assistance     Functional Limitations Info              SPECIAL CARE FACTORS FREQUENCY  PT (By licensed PT)     PT Frequency: 5x/week              Contractures      Additional Factors Info  Insulin Sliding Scale Code Status Info: Full Allergies Info: NKA   Insulin Sliding Scale Info: 3x daily with meals       Current Medications (10/05/2016):  This is the current hospital active medication list Current Facility-Administered Medications  Medication Dose Route Frequency Provider Last Rate Last Dose  . acetaminophen (TYLENOL) tablet 650 mg  650 mg Oral Q6H Johnson, Clanford L, MD   650 mg at 10/05/16 0101  . amLODipine (NORVASC) tablet 10 mg  10 mg Oral Daily Johnson, Clanford L, MD   10 mg at 10/04/16 0905  . bupivacaine (MARCAINE) 0.5 % injection 10 mL  10 mL Infiltration Once Lisette Abu, PA-C      . chlorhexidine (PERIDEX) 0.12 % solution 15 mL  15 mL Mouth Rinse BID Simonne Maffucci B, MD   15 mL at 10/04/16 2129  . cloNIDine (CATAPRES - Dosed in mg/24 hr) patch 0.3 mg  0.3 mg Transdermal Weekly Raylene Miyamoto, MD   0.3 mg at 10/01/16 1338  . colchicine tablet 1.2 mg  1.2 mg Oral Daily Johnson, Clanford L, MD   1.2 mg at 10/04/16 0902  . diphenoxylate-atropine (LOMOTIL) 2.5-0.025 MG per tablet 2 tablet  2 tablet Oral QID PRN Murlean Iba, MD   2 tablet at 10/04/16 0903  .  enoxaparin (LOVENOX) injection 40 mg  40 mg Subcutaneous Q24H Greta Doom, MD   40 mg at 10/04/16 0905  . fentaNYL (SUBLIMAZE) injection 25 mcg  25 mcg Intravenous Q4H PRN Amin, Ankit Chirag, MD   25 mcg at 02/22/50 0258  . folic acid (FOLVITE) tablet 1 mg  1 mg Oral Daily Johnson, Clanford L, MD   1 mg at 10/04/16 0905  . haloperidol lactate (HALDOL) injection 5 mg  5 mg Intravenous Q6H PRN Wynetta Emery, Clanford L, MD   5 mg at 10/01/16 2130  . hydrALAZINE (APRESOLINE) injection 10-40 mg  10-40 mg Intravenous Q4H PRN Rigoberto Noel, MD   10 mg at 10/02/16 0819  . hydrALAZINE (APRESOLINE) tablet 75 mg  75 mg Oral Q8H Johnson, Clanford  L, MD   75 mg at 10/05/16 0536  . insulin aspart (novoLOG) injection 0-15 Units  0-15 Units Subcutaneous TID WC Johnson, Clanford L, MD   8 Units at 10/04/16 1741  . insulin aspart (novoLOG) injection 3 Units  3 Units Subcutaneous TID WC Johnson, Clanford L, MD   3 Units at 10/04/16 1741  . ipratropium-albuterol (DUONEB) 0.5-2.5 (3) MG/3ML nebulizer solution 3 mL  3 mL Nebulization Q2H PRN Sood, Vineet, MD      . lidocaine (XYLOCAINE) 2 % (with pres) injection 100 mg  5 mL Infiltration Once Lisette Abu, PA-C      . lipase/protease/amylase (CREON) capsule 24,000 Units  24,000 Units Oral TID WC Wynetta Emery, Clanford L, MD   24,000 Units at 10/04/16 1741  . LORazepam (ATIVAN) injection 0.5 mg  0.5 mg Intravenous Q4H PRN Wynetta Emery, Clanford L, MD   0.5 mg at 10/02/16 2202  . MEDLINE mouth rinse  15 mL Mouth Rinse q12n4p Simonne Maffucci B, MD   15 mL at 10/04/16 1700  . methylPREDNISolone acetate (DEPO-MEDROL) injection 40 mg  40 mg Intra-articular Once Lisette Abu, PA-C      . metoprolol tartrate (LOPRESSOR) tablet 100 mg  100 mg Oral BID Wynetta Emery, Clanford L, MD   100 mg at 10/04/16 2128  . nicotine (NICODERM CQ - dosed in mg/24 hours) patch 21 mg  21 mg Transdermal Daily Johnson, Clanford L, MD   21 mg at 10/04/16 1000  . oxyCODONE (Oxy IR/ROXICODONE) immediate release tablet 5 mg  5 mg Oral Q3H PRN Wynetta Emery, Clanford L, MD   5 mg at 10/05/16 0539  . predniSONE (DELTASONE) tablet 60 mg  60 mg Oral Q breakfast Johnson, Clanford L, MD   60 mg at 10/04/16 0900  . RESOURCE THICKENUP CLEAR   Oral PRN Johnson, Clanford L, MD      . thiamine (VITAMIN B-1) tablet 100 mg  100 mg Oral Daily Johnson, Clanford L, MD   100 mg at 10/04/16 0905  . Vilazodone HCl (VIIBRYD) TABS 40 mg  40 mg Oral Daily Johnson, Clanford L, MD   40 mg at 10/04/16 5277     Discharge Medications: Please see discharge summary for a list of discharge medications.  Relevant Imaging Results:  Relevant Lab  Results:   Additional Information SSN: [place] [address] [place], [place]
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
## 2016-10-05 NOTE — Progress Notes (Signed)
Nutrition Follow-up  DOCUMENTATION CODES:   Not applicable  INTERVENTION:   -Ensure Enlive po TID, each supplement provides 350 kcal and 20 grams of protein -MVI daily  NUTRITION DIAGNOSIS:   Inadequate oral intake related to poor appetite as evidenced by meal completion < 25%.  Ongoing  GOAL:   Patient will meet greater than or equal to 90% of their needs  Unmet  MONITOR:   PO intake, Supplement acceptance, Labs, Weight trends, Skin, I & O's  REASON FOR ASSESSMENT:   Consult, Rounds (verbal consult during rounds) Enteral/tube feeding initiation and management  ASSESSMENT:   71 year old male with a past medical history significant for alcohol abuse was admitted on 09/21/2016 with acute encephalopathy in the setting of alcohol withdrawal. He required intubation for airway protection.   5/26- advanced to dysphagia 2 diet with nectar thick liquids 5/28- NGT removed, TF d/c 5/29- s/p Right olecranon bursa aspiration by orthopedics 5/30- advanced to dysphagia 3 diet with thin liquids  Spoke with pt, who reports feeling better and swallowing very well. He reports good acceptance of current diet texture; reports he consumed fruit, potatoes, and eggs, however, consumed only about 25% of meal, which is consistent with meal intake records.   Discussed importance of good meal completion to assist with healing. Pt amenable to Ensure supplements, which RD will order.   Labs reviewed: CBGS: 88-132.   Diet Order:  DIET DYS 3 Room service appropriate? Yes; Fluid consistency: Thin  Skin:  Reviewed, no issues  Last BM:  10/04/16  Height:   Ht Readings from Last 1 Encounters:  09/21/16 6' (1.829 m)    Weight:   Wt Readings from Last 1 Encounters:  10/05/16 143 lb 11.8 oz (65.2 kg)    Ideal Body Weight:  74.4 kg (adjusted for amputation)  BMI:  Body mass index is 19.49 kg/m.  Estimated Nutritional Needs:   Kcal:  1850-2050  Protein:  90-100 gm  Fluid:  1.8-2  L  EDUCATION NEEDS:   Education needs addressed  Lachrista Heslin A. Jimmye Norman, RD, LDN, CDE Pager: [phone] After hours Pager: [phone]

## 2016-10-05 NOTE — Progress Notes (Signed)
Cultures negative to date

## 2016-10-05 NOTE — Progress Notes (Signed)
Patient ID: [name], male   DOB: Mar 20, 1946, 71 y.o.   MRN: [id number]   LOS: 15 days   Subjective: Pt states he's feeling better.   Objective: Vital signs in last 24 hours: Temp:  [98.1 F (36.7 C)-98.8 F (37.1 C)] 98.1 F (36.7 C) (05/30 0602) Pulse Rate:  [62-76] 68 (05/30 0916) Resp:  [9-20] 20 (05/30 0602) BP: (132-161)/(76-87) 161/79 (05/30 0916) SpO2:  [90 %-100 %] 97 % (05/30 0602) Weight:  [65.2 kg (143 lb 11.8 oz)] 65.2 kg (143 lb 11.8 oz) (05/30 0035) Last BM Date: 10/04/16   Laboratory  CBC  Recent Labs  10/03/16 0352 10/05/16 0535  WBC 17.4* 18.5*  HGB 8.5* 8.1*  HCT 26.3* 25.4*  PLT 375 470*   BMET  Recent Labs  10/03/16 0352 10/05/16 0535  NA 144 141  K 3.7 4.0  CL 112* 110  CO2 24 22  GLUCOSE 120* 101*  BUN 26* 37*  CREATININE 1.30* 1.23  CALCIUM 8.7* 8.4*     Physical Exam General appearance: alert and no distress  Right shoulder, elbow, wrist, digits- no skin wounds, TTP but improves over yesterday, no instability, no blocks to motion. Bursal fluid has reaccumulated though not quite at tense. Elbow flexion greatly improved, extension still about the same.   Assessment/Plan: Gout -- Continue gout medications and steroids. Can f/u with PCP and/or orthopedic surgery at discharge. Ok to d/c from orthopedic standpoint.    Lisette Abu, PA-C Orthopedic Surgery [phone] 10/05/2016
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
## 2016-10-06 LAB — GLUCOSE, CAPILLARY
GLUCOSE-CAPILLARY: 107 mg/dL — AB (ref 65–99)
GLUCOSE-CAPILLARY: 224 mg/dL — AB (ref 65–99)
Glucose-Capillary: 114 mg/dL — ABNORMAL HIGH (ref 65–99)

## 2016-10-06 MED ORDER — ACETAMINOPHEN 500 MG PO TABS
1000.0000 mg | ORAL_TABLET | Freq: Three times a day (TID) | ORAL | Status: DC
  Administered 2016-10-06 – 2016-10-07 (×3): 1000 mg via ORAL
  Filled 2016-10-06 (×3): qty 2

## 2016-10-06 MED ORDER — HYDROMORPHONE HCL 1 MG/ML IJ SOLN
0.5000 mg | INTRAMUSCULAR | Status: DC | PRN
  Administered 2016-10-06: 0.5 mg via INTRAVENOUS
  Filled 2016-10-06: qty 0.5

## 2016-10-06 MED ORDER — OXYCODONE HCL 5 MG PO TABS
10.0000 mg | ORAL_TABLET | ORAL | Status: DC | PRN
  Administered 2016-10-06 – 2016-10-07 (×4): 10 mg via ORAL
  Filled 2016-10-06 (×4): qty 2
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
## 2016-10-06 NOTE — Progress Notes (Signed)
Right elbow cultures no growth to date

## 2016-10-06 NOTE — Progress Notes (Signed)
CSW received consult regarding PT recommendation of SNF at discharge.  CSW spoke with patient. Patient is refusing SNF. He stated he was in too much pain in his hand and leg to stay at the hospital and wanted to leave so he could take some pain medicine since we weren't helping him. He did not want to go and just lay at a SNF. He lives alone but has family to help him when needed. His car is currently at his friend's house.  CSW signing off.   Percell Locus Zenia Guest LCSWA [phone]

## 2016-10-06 NOTE — Progress Notes (Signed)
PT Cancellation Note  Patient Details Name: [name] MRN: [id number] DOB: 1946-04-01   Cancelled Treatment:     pt in too much pain esp R UE to attempt any OOB activity.  Pt unable to push up or pull self up even to sit up in bed.  Will re attempt another day as schedule permits.   Rica Koyanagi  PTA [facility]  Acute  Rehab Pager      [phone]
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
## 2016-10-07 LAB — GLUCOSE, CAPILLARY
GLUCOSE-CAPILLARY: 125 mg/dL — AB (ref 65–99)
GLUCOSE-CAPILLARY: 127 mg/dL — AB (ref 65–99)
GLUCOSE-CAPILLARY: 136 mg/dL — AB (ref 65–99)
GLUCOSE-CAPILLARY: 183 mg/dL — AB (ref 65–99)
Glucose-Capillary: 115 mg/dL — ABNORMAL HIGH (ref 65–99)
Glucose-Capillary: 122 mg/dL — ABNORMAL HIGH (ref 65–99)
Glucose-Capillary: 124 mg/dL — ABNORMAL HIGH (ref 65–99)
Glucose-Capillary: 124 mg/dL — ABNORMAL HIGH (ref 65–99)
Glucose-Capillary: 134 mg/dL — ABNORMAL HIGH (ref 65–99)
Glucose-Capillary: 155 mg/dL — ABNORMAL HIGH (ref 65–99)
Glucose-Capillary: 179 mg/dL — ABNORMAL HIGH (ref 65–99)
Glucose-Capillary: 203 mg/dL — ABNORMAL HIGH (ref 65–99)
Glucose-Capillary: 223 mg/dL — ABNORMAL HIGH (ref 65–99)
Glucose-Capillary: 111 mg/dL — ABNORMAL HIGH (ref 65–99)

## 2016-10-07 LAB — BASIC METABOLIC PANEL
Anion gap: 8 (ref 5–15)
BUN: 38 mg/dL — AB (ref 6–20)
CALCIUM: 8.8 mg/dL — AB (ref 8.9–10.3)
CHLORIDE: 108 mmol/L (ref 101–111)
CO2: 25 mmol/L (ref 22–32)
CREATININE: 1.29 mg/dL — AB (ref 0.61–1.24)
GFR calc Af Amer: >60 mL/min (ref >60)
GFR calc non Af Amer: 54 mL/min — ABNORMAL LOW (ref >60)
Glucose, Bld: 153 mg/dL — ABNORMAL HIGH (ref 65–99)
Potassium: 3.6 mmol/L (ref 3.5–5.1)
Sodium: 141 mmol/L (ref 135–145)

## 2016-10-07 LAB — BODY FLUID CULTURE: CULTURE: NO GROWTH

## 2016-10-07 LAB — CBC
HCT: 27 % — ABNORMAL LOW (ref 39.0–52.0)
HEMOGLOBIN: 8.6 g/dL — AB (ref 13.0–17.0)
MCH: 26.6 pg (ref 26.0–34.0)
MCHC: 31.9 g/dL (ref 30.0–36.0)
MCV: 83.6 fL (ref 78.0–100.0)
Platelets: 509 10*3/uL — ABNORMAL HIGH (ref 150–400)
RBC: 3.23 MIL/uL — ABNORMAL LOW (ref 4.22–5.81)
RDW: 17.1 % — AB (ref 11.5–15.5)
WBC: 18 10*3/uL — ABNORMAL HIGH (ref 4.0–10.5)

## 2016-10-07 MED ORDER — CREON 24000-76000 UNITS PO CPEP: 24000.0000 [IU] | ORAL_CAPSULE | Freq: Three times a day (TID) | ORAL | 0 refills | Status: DC

## 2016-10-07 MED ORDER — NICOTINE 21 MG/24HR TD PT24: 21.0000 mg | MEDICATED_PATCH | Freq: Every day | TRANSDERMAL | 0 refills | Status: DC

## 2016-10-07 MED ORDER — ALBUTEROL SULFATE HFA 108 (90 BASE) MCG/ACT IN AERS: 2.0000 | INHALATION_SPRAY | Freq: Four times a day (QID) | RESPIRATORY_TRACT | 0 refills | Status: AC | PRN

## 2016-10-07 MED ORDER — ADULT MULTIVITAMIN W/MINERALS CH: 1.0000 | ORAL_TABLET | Freq: Every day | ORAL | 0 refills | Status: DC

## 2016-10-07 MED ORDER — THIAMINE HCL 100 MG PO TABS: 100.0000 mg | ORAL_TABLET | Freq: Every day | ORAL | 0 refills | Status: DC

## 2016-10-07 MED ORDER — FOLIC ACID 1 MG PO TABS: 1.0000 mg | ORAL_TABLET | Freq: Every day | ORAL | 0 refills | Status: DC

## 2016-10-07 MED ORDER — COLCHICINE 0.6 MG PO TABS: 0.6000 mg | ORAL_TABLET | Freq: Every day | ORAL | 0 refills | Status: DC | PRN

## 2016-10-07 MED ORDER — ACETAMINOPHEN 500 MG PO TABS: 1000.0000 mg | ORAL_TABLET | Freq: Three times a day (TID) | ORAL | Status: DC | PRN

## 2016-10-07 MED ORDER — RESOURCE THICKENUP CLEAR PO POWD: ORAL | 0 refills | Status: DC

## 2016-10-07 MED ORDER — PREDNISONE 10 MG PO TABS: ORAL_TABLET | ORAL | 0 refills | Status: DC

## 2016-10-07 MED ORDER — OXYCODONE HCL 5 MG PO TABS: 5.0000 mg | ORAL_TABLET | Freq: Four times a day (QID) | ORAL | 0 refills | Status: DC | PRN

## 2016-10-07 MED ORDER — AMLODIPINE BESYLATE 10 MG PO TABS: 10.0000 mg | ORAL_TABLET | Freq: Every day | ORAL | 0 refills | Status: DC

## 2016-10-07 MED ORDER — SYMBICORT 80-4.5 MCG/ACT IN AERO: 2.0000 | INHALATION_SPRAY | Freq: Two times a day (BID) | RESPIRATORY_TRACT | 0 refills | Status: AC

## 2016-10-07 MED ORDER — VIIBRYD 40 MG PO TABS: 40.0000 mg | ORAL_TABLET | Freq: Every day | ORAL | 0 refills | Status: DC

## 2016-10-07 MED ORDER — SIMVASTATIN 20 MG PO TABS: 20.0000 mg | ORAL_TABLET | Freq: Every day | ORAL | 0 refills | Status: DC

## 2016-10-07 MED ORDER — HYDRALAZINE HCL 50 MG PO TABS: 75.0000 mg | ORAL_TABLET | Freq: Three times a day (TID) | ORAL | 0 refills | Status: DC

## 2016-10-07 MED ORDER — CLONIDINE HCL 0.1 MG PO TABS: 0.1000 mg | ORAL_TABLET | Freq: Two times a day (BID) | ORAL | 0 refills | Status: DC

## 2016-10-07 MED ORDER — METOPROLOL TARTRATE 100 MG PO TABS: 100.0000 mg | ORAL_TABLET | Freq: Two times a day (BID) | ORAL | 0 refills | Status: DC
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
## 2016-10-07 NOTE — Discharge Summary (Addendum)
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 Status   Specimen Description SYNOVIAL RIGHT ARM  Final   Special Requests ELBOW  Final   Gram Stain   Final    FEW WBC PRESENT,BOTH PMN AND MONONUCLEAR NO ORGANISMS SEEN    Culture NO GROWTH 3 DAYS  Final   Report Status 10/07/2016 FINAL  Final     Labs: CBC:  Recent Labs Lab 10/01/16 0231 10/02/16 0241 10/03/16 0352 10/05/16 0535 10/07/16 0727  WBC 19.0* 22.0* 17.4* 18.5* 18.0*  NEUTROABS 17.6* 19.6* 13.6* 15.9*  --   HGB 7.5* 7.6* 8.5* 8.1* 8.6*  HCT 23.2* 23.2* 26.3* 25.4* 27.0*  MCV 82.3 82.3 81.9 82.2 83.6  PLT 252 305 375 470* 735*   Basic Metabolic Panel:  Recent Labs Lab 10/01/16 0231 10/02/16 0241 10/03/16 0352 10/05/16 0535 10/07/16 0727  NA 146* 145 144 141 141  K 3.3* 3.3* 3.7 4.0 3.6  CL 112* 111 112* 110 108  CO2 25 25 24 22 25   GLUCOSE 178* 139* 120* 101* 153*  BUN 30* 29* 26* 37* 38*  CREATININE 1.38* 1.25* 1.30* 1.23 1.29*  CALCIUM 8.5* 8.5* 8.7* 8.4* 8.8*  MG 2.1 2.0  --    --   --    Liver Function Tests:  Recent Labs  Lab 10/02/16 0241 10/03/16 0352 10/05/16 0535  AST 46* 53* 31  ALT 44 63 46  ALKPHOS 78 82 69  BILITOT 0.3 0.6 0.5  PROT 5.2* 5.4* 7.0  ALBUMIN 1.9* 1.9* 1.8*   CBG:  Recent Labs Lab 10/06/16 1220 10/06/16 1627 10/07/16 0051 10/07/16 0819 10/07/16 1224  GLUCAP 114* 224* 136* 111* 127*   Discussed in detail with patient's healthcare power of attorney at bedside. Updated care and answered questions.   Time coordinating discharge: Over 30 minutes  SIGNED:  Vernell Leep, MD, FACP, [place]. [facility] Pager [phone]  If 7PM-7AM, please contact night-coverage [email or web address] Password [facility] 10/07/2016, 3:48 PM

## 2016-10-07 NOTE — Progress Notes (Signed)
CM called and arranged PT's transportation to home with PTAR. Nurse to call [name] @ [phone] once PTAR has arrived on floor.[name] will received pt  @ home. Whitman Hero RN,BSN,CM

## 2016-10-07 NOTE — Progress Notes (Signed)
Orthopedic Tech Progress Note Patient Details:  [name] 1946/04/30 [id number]  Patient ID: [name], male   DOB: 1945/05/19, 71 y.o.   MRN: [id number]   [name] 10/07/2016, 3:17 PM PT as of yet stated that they have to determine if pt is able to use crutches; RN NOTIFIED

## 2016-10-07 NOTE — Progress Notes (Addendum)
[name] to be D/C'd Home per MD order.  Discussed with the patient and all questions fully answered.  VSS, Skin clean, dry and intact without evidence of skin break down, no evidence of skin tears noted. IV catheter discontinued intact. Site without signs and symptoms of complications. Dressing and pressure applied.  An After Visit Summary was printed and given to the patient. Patient received prescription. Discharge done with daughter POA on phone.   D/c education completed with patient/family including follow up instructions, medication list, d/c activities limitations if indicated, with other d/c instructions as indicated by MD - patient able to verbalize understanding, all questions fully answered.   Patient instructed to return to ED, call 911, or call MD for any changes in condition.   Patient escorted via [place], and D/C home via transport.  Allergies as of 10/07/2016   No Known Allergies     Medication List    STOP taking these medications   fluorometholone 0.1 % ophthalmic suspension Commonly known as:  EFLONE   imatinib 100 MG tablet Commonly known as:  GLEEVEC   losartan-hydrochlorothiazide 100-25 MG tablet Commonly known as:  HYZAAR   meloxicam 7.5 MG tablet Commonly known as:  MOBIC   metoprolol succinate 50 MG 24 hr tablet Commonly known as:  TOPROL-XL   NOREL AD 4-10-325 MG Tabs Generic drug:  Chlorphen-PE-Acetaminophen     TAKE these medications   acetaminophen 500 MG tablet Commonly known as:  TYLENOL Take 2 tablets (1,000 mg total) by mouth every 8 (eight) hours as needed for mild pain.   albuterol 108 (90 Base) MCG/ACT inhaler Commonly known as:  VENTOLIN HFA Inhale 2 puffs into the lungs every 6 (six) hours as needed for wheezing or shortness of breath. What changed:  See the new instructions.   amLODipine 10 MG tablet Commonly known as:  NORVASC Take 1 tablet (10 mg total) by mouth daily.   cloNIDine 0.1 MG tablet Commonly known as:   CATAPRES Take 1 tablet (0.1 mg total) by mouth 2 (two) times daily. What changed:  when to take this   colchicine 0.6 MG tablet Take 1-2 tablets (0.6-1.2 mg total) by mouth daily as needed (gout).   CREON 24000-76000 units Cpep Generic drug:  Pancrelipase (Lip-Prot-Amyl) Take 1 capsule (24,000 Units total) by mouth 3 (three) times daily.   folic acid 1 MG tablet Commonly known as:  FOLVITE Take 1 tablet (1 mg total) by mouth daily. Start taking on:  10/08/2016   hydrALAZINE 50 MG tablet Commonly known as:  APRESOLINE Take 1.5 tablets (75 mg total) by mouth 3 (three) times daily.   metoprolol tartrate 100 MG tablet Commonly known as:  LOPRESSOR Take 1 tablet (100 mg total) by mouth 2 (two) times daily.   multivitamin with minerals Tabs tablet Take 1 tablet by mouth daily. Start taking on:  10/08/2016   nicotine 21 mg/24hr patch Commonly known as:  NICODERM CQ - dosed in mg/24 hours Place 1 patch (21 mg total) onto the skin daily. Start taking on:  10/08/2016   oxyCODONE 5 MG immediate release tablet Commonly known as:  Oxy IR/ROXICODONE Take 1-2 tablets (5-10 mg total) by mouth every 6 (six) hours as needed for moderate pain or severe pain.   predniSONE 10 MG tablet Commonly known as:  DELTASONE Take 5 tablets daily for 3 days, then 4 tablets daily for 3 days, then 3 tablets daily for 3 days, then 2 tabs daily for 3 days, then 1 tablet daily for  3 days, then stop.   RESOURCE THICKENUP CLEAR Powd Oral, As needed, other   simvastatin 20 MG tablet Commonly known as:  ZOCOR Take 1 tablet (20 mg total) by mouth at bedtime.   SYMBICORT 80-4.5 MCG/ACT inhaler Generic drug:  budesonide-formoterol Inhale 2 puffs into the lungs 2 (two) times daily.   thiamine 100 MG tablet Take 1 tablet (100 mg total) by mouth daily. Start taking on:  10/08/2016   VIIBRYD 40 MG Tabs Generic drug:  Vilazodone HCl Take 1 tablet (40 mg total) by mouth daily.      information printed also for  respaitory failure  Betha Loa Brilynn Biasi 10/07/2016 4:46 PM

## 2016-10-10 ENCOUNTER — Encounter (HOSPITAL_COMMUNITY): Payer: Self-pay

## 2016-10-12 ENCOUNTER — Ambulatory Visit (HOSPITAL_BASED_OUTPATIENT_CLINIC_OR_DEPARTMENT_OTHER): Payer: Medicare Other | Admitting: Nurse Practitioner

## 2016-10-12 ENCOUNTER — Other Ambulatory Visit (HOSPITAL_BASED_OUTPATIENT_CLINIC_OR_DEPARTMENT_OTHER): Payer: Medicare Other

## 2016-10-12 VITALS — BP 129/71 | HR 55 | Temp 98.0°F | Resp 18 | Ht 72.0 in

## 2016-10-12 DIAGNOSIS — C9211 Chronic myeloid leukemia, BCR/ABL-positive, in remission: Secondary | ICD-10-CM | POA: Diagnosis present

## 2016-10-12 DIAGNOSIS — C9210 Chronic myeloid leukemia, BCR/ABL-positive, not having achieved remission: Secondary | ICD-10-CM

## 2016-10-12 DIAGNOSIS — M109 Gout, unspecified: Secondary | ICD-10-CM

## 2016-10-12 LAB — CBC WITH DIFFERENTIAL/PLATELET
BASO%: 0.1 % (ref 0.0–2.0)
BASOS ABS: 0 10*3/uL (ref 0.0–0.1)
EOS ABS: 0.1 10*3/uL (ref 0.0–0.5)
EOS%: 0.8 % (ref 0.0–7.0)
HCT: 28.7 % — ABNORMAL LOW (ref 38.4–49.9)
HEMOGLOBIN: 9 g/dL — AB (ref 13.0–17.1)
LYMPH%: 12.8 % — ABNORMAL LOW (ref 14.0–49.0)
MCH: 26.9 pg — AB (ref 27.2–33.4)
MCHC: 31.4 g/dL — ABNORMAL LOW (ref 32.0–36.0)
MCV: 85.9 fL (ref 79.3–98.0)
MONO#: 1.1 10*3/uL — AB (ref 0.1–0.9)
MONO%: 6.6 % (ref 0.0–14.0)
NEUT%: 79.7 % — ABNORMAL HIGH (ref 39.0–75.0)
NEUTROS ABS: 12.7 10*3/uL — AB (ref 1.5–6.5)
PLATELETS: 352 10*3/uL (ref 140–400)
RBC: 3.34 10*6/uL — ABNORMAL LOW (ref 4.20–5.82)
RDW: 17.8 % — AB (ref 11.0–14.6)
WBC: 16 10*3/uL — AB (ref 4.0–10.3)
lymph#: 2.1 10*3/uL (ref 0.9–3.3)

## 2016-10-12 LAB — COMPREHENSIVE METABOLIC PANEL
ALBUMIN: 2.4 g/dL — AB (ref 3.5–5.0)
ALK PHOS: 71 U/L (ref 40–150)
ALT: 68 U/L — ABNORMAL HIGH (ref 0–55)
ANION GAP: 6 meq/L (ref 3–11)
AST: 27 U/L (ref 5–34)
BILIRUBIN TOTAL: 0.34 mg/dL (ref 0.20–1.20)
BUN: 19 mg/dL (ref 7.0–26.0)
CALCIUM: 8.5 mg/dL (ref 8.4–10.4)
CO2: 28 mEq/L (ref 22–29)
Chloride: 104 mEq/L (ref 98–109)
Creatinine: 1.1 mg/dL (ref 0.7–1.3)
EGFR: 82 mL/min/{1.73_m2} — ABNORMAL LOW (ref >90)
GLUCOSE: 89 mg/dL (ref 70–140)
POTASSIUM: 3.9 meq/L (ref 3.5–5.1)
SODIUM: 138 meq/L (ref 136–145)
TOTAL PROTEIN: 5.1 g/dL — AB (ref 6.4–8.3)

## 2016-10-12 LAB — TECHNOLOGIST REVIEW

## 2016-10-12 MED ORDER — OXYCODONE HCL 5 MG PO TABS: 5.0000 mg | ORAL_TABLET | Freq: Four times a day (QID) | ORAL | 0 refills | Status: DC | PRN

## 2016-10-12 NOTE — Progress Notes (Signed)
[place] OFFICE PROGRESS NOTE   Diagnosis:  CML  INTERVAL HISTORY:   Mr. Daily returns for follow-up. He was last seen at the [facility] 07/05/2016. He was hospitalized 09/20/2016 through 10/07/2016 with unresponsiveness and acute metabolic encephalopathy likely secondary to alcohol withdrawal. Gleevec was held during the hospitalization.  He is not sure if he has resumed Gleevec. He reports multiple areas of gout flare. Per the discharge summary he is on a prednisone taper, takes allopurinol and has colchicine. He was given a prescription for 15 tablets of oxycodone with instructions to take 1-2 tablets every 6 hours as needed upon discharge. He reports he has taken these and is still in significant pain related to the gout. He is scheduled to see his PCP on 10/14/2016. He denies nausea/vomiting. No diarrhea.  Objective: Vital signs in last 24 hours:  Blood pressure 129/71, pulse (!) 55, temperature 98 F (36.7 C), temperature source Oral, resp. rate 18, height 6' (1.829 m), SpO2 100 %.    HEENT: No thrush or ulcers. Resp: Distant breath sounds. No respiratory distress. Cardio: Regular rate and rhythm. GI: Abdomen soft and nontender. No organomegaly. Vascular: No left leg edema. Neuro: Alert and oriented Musculoskeletal: Second MCP joint right hand markedly edematous, tender, mildly erythematous. Left wrist with a similar appearance.    Lab Results:  Lab Results  Component Value Date   WBC 16.0 (H) 10/12/2016   HGB 9.0 (L) 10/12/2016   HCT 28.7 (L) 10/12/2016   MCV 85.9 10/12/2016   PLT 352 10/12/2016   NEUTROABS 12.7 (H) 10/12/2016    Imaging:  No results found.  Medications: I have reviewed the patient's current medications.  Assessment/Plan: 1. Chronic myelogenous leukemia, diagnosed in January of 2009. He remains in hematologic remission. He is taking Gleevec at a dose of 200 mg daily. The peripheral blood PCR was markedly increased in May  2013, likely reflecting medical noncompliance. The peripheral blood PCR was significantly improved on 11/07/2011. The peripheral blood PCR was further improved on 01/31/2012. The peripheral blood PCR was increased on 04/27/2012; stable on 11/12/2012, improved on 02/12/2013; increased on 04/29/2013; increased on 07/10/2013; improved on 09/16/2013; improved on 10/28/2013; increased 02/17/2014; slightly improved 03/28/2014. Stable 04/29/2014. Increased 05/30/2014. Improved 07/26/2014. Increased 07/14/2015.Improved August 2017, stable October 2017. 2. 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 3. Status post C3-C4, C4-C5, anterior cervical diskectomy and fusion with allograft and plating 09/30/2010. 4. 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 5. Depression 6. Diarrhea, ? related to chronic pancreatitis versus Gleevec. He takes Lomotil as needed.  7. Indurated facial skin lesion 11/20/2007 with a history of MRSA skin infection of the submental area in May 2008. The induration resolved with doxycycline. 8. Left knee arthroscopy 05/25/2007. 9. Postoperative left knee effusion/pain, likely related to gout. 10. History of gout.  11. Chronic pancreatitis. 12. Status post right above-the-knee amputation. 13. MRSA infection of the submental area May 2008. 14. History of tobacco, alcohol, and cocaine use. 15. History of coronary artery disease. 16. "Shotty" lymphadenopathy of the neck, axilla, and left groin in 2009.  17. History of a microcytic anemia.  18. History of a right olecranon bursa lesion, ? gouty tophus. 19. 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 He previously took AndroGel. 20. History of anemia secondary to chronic disease and Gleevec therapy.  21. history ofAnorexia-potentially related to [place]. He reports Medicaid would not pay for Megace.  22. Chronic left knee and left foot  pain.  23. Status post removal of a left  knee screw 07/03/2012. 24. History of Hypokalemia. Likely related to diarrhea. 25. Status post left foot surgery. 26. Pulsatile fullness right neck. Evaluated by vascular surgery status post CT angiogram 03/07/2014 with no carotid artery aneurysm identified. 27. Cough-abnormal lung exam 05/12/2015. He completed a course of Levaquin. Chest xray negative.    Disposition: Mr. Debrosse has CML. He has been maintained on Gleevec since 2009. He was hospitalized last month. Gleevec was held during the hospitalization. Prior to the hospitalization he reports good compliance with Gleevec. We will follow-up on the peripheral blood PCR from today.  He is anemic on labs today. The hemoglobin is better as compared to hospital discharge. We will have him return in 2 weeks for a follow-up CBC.  During the recent hospitalization he had an acute gout flare. He is currently on a prednisone taper. He was given a small supply of oxycodone at the time of discharge on 10/07/2016. He is now out of oxycodone. He continues to have significant pain involving several joints. I gave him a prescription for oxycodone 5 mg one tablet every 6 hours as needed with 10 tablets to be dispensed. He is scheduled to see his PCP on 10/14/2016. He understands he should obtain further prescriptions for pain medication from his PCP.  As noted above he will return for a CBC in 2 weeks. We will see him in follow-up in 4 weeks.    [name] ANP/GNP-BC   10/12/2016  2:10 PM

## 2016-10-14 ENCOUNTER — Telehealth: Payer: Self-pay | Admitting: Nurse Practitioner

## 2016-10-14 NOTE — Telephone Encounter (Signed)
Scheduled appt per 6/6 los - unable to leave message - full voicemail. Sent reminder letter in the mail.

## 2016-10-17 ENCOUNTER — Telehealth: Payer: Self-pay | Admitting: *Deleted

## 2016-10-17 DIAGNOSIS — C9210 Chronic myeloid leukemia, BCR/ABL-positive, not having achieved remission: Secondary | ICD-10-CM

## 2016-10-17 MED ORDER — IMATINIB MESYLATE 100 MG PO TABS: 200.0000 mg | ORAL_TABLET | Freq: Every day | ORAL | 1 refills | Status: DC

## 2016-10-17 NOTE — Telephone Encounter (Signed)
Message from pt reporting he has been without his Gleevec for 17 days (recent hospitalization). He request an "emergency prescription" to be called to local pharmacy. Called [facility], they are unable to dispense Gleevec. Script sent to [facility] with request that they rush shipment.

## 2016-10-25 ENCOUNTER — Other Ambulatory Visit (HOSPITAL_BASED_OUTPATIENT_CLINIC_OR_DEPARTMENT_OTHER): Payer: Medicare Other

## 2016-10-25 DIAGNOSIS — C9210 Chronic myeloid leukemia, BCR/ABL-positive, not having achieved remission: Secondary | ICD-10-CM

## 2016-10-25 DIAGNOSIS — C9211 Chronic myeloid leukemia, BCR/ABL-positive, in remission: Secondary | ICD-10-CM | POA: Diagnosis not present

## 2016-10-25 LAB — CBC WITH DIFFERENTIAL/PLATELET
BASO%: 0.8 % (ref 0.0–2.0)
Basophils Absolute: 0.1 10*3/uL (ref 0.0–0.1)
EOS%: 4.3 % (ref 0.0–7.0)
Eosinophils Absolute: 0.3 10*3/uL (ref 0.0–0.5)
HCT: 31.5 % — ABNORMAL LOW (ref 38.4–49.9)
HGB: 10 g/dL — ABNORMAL LOW (ref 13.0–17.1)
LYMPH%: 21.5 % (ref 14.0–49.0)
MCH: 26.8 pg — AB (ref 27.2–33.4)
MCHC: 31.6 g/dL — AB (ref 32.0–36.0)
MCV: 84.9 fL (ref 79.3–98.0)
MONO#: 0.7 10*3/uL (ref 0.1–0.9)
MONO%: 8.8 % (ref 0.0–14.0)
NEUT#: 4.8 10*3/uL (ref 1.5–6.5)
NEUT%: 64.6 % (ref 39.0–75.0)
Platelets: 132 10*3/uL — ABNORMAL LOW (ref 140–400)
RBC: 3.71 10*6/uL — ABNORMAL LOW (ref 4.20–5.82)
RDW: 18.4 % — AB (ref 11.0–14.6)
WBC: 7.4 10*3/uL (ref 4.0–10.3)
lymph#: 1.6 10*3/uL (ref 0.9–3.3)

## 2016-10-27 ENCOUNTER — Telehealth: Payer: Self-pay

## 2016-10-27 NOTE — Telephone Encounter (Signed)
Call returned to patient regarding his c/o "dizziness". Informed him that MD [name] was made aware and MD [name] recommends that he go see his PCP. Informed pt that his Hb for 6/19 was 10. Patient appreciative of the call back and states that he has an appointment with his PCP this afternoon.

## 2016-10-31 ENCOUNTER — Telehealth: Payer: Self-pay | Admitting: *Deleted

## 2016-10-31 NOTE — Telephone Encounter (Signed)
-----   Message from Owens Shark, NP sent at 10/31/2016  9:06 AM EDT ----- Please make sure he is taking Gleevec.

## 2016-10-31 NOTE — Telephone Encounter (Signed)
Patient received Gleevac last Tuesday (10/25/16) and has been taking it since.

## 2016-11-08 ENCOUNTER — Other Ambulatory Visit: Payer: Self-pay

## 2016-11-08 ENCOUNTER — Other Ambulatory Visit (HOSPITAL_BASED_OUTPATIENT_CLINIC_OR_DEPARTMENT_OTHER): Payer: Medicare Other

## 2016-11-08 ENCOUNTER — Ambulatory Visit (HOSPITAL_BASED_OUTPATIENT_CLINIC_OR_DEPARTMENT_OTHER): Payer: Medicare Other | Admitting: Oncology

## 2016-11-08 VITALS — BP 148/76 | HR 88 | Temp 97.8°F | Resp 18 | Ht 72.0 in | Wt 157.1 lb

## 2016-11-08 DIAGNOSIS — M109 Gout, unspecified: Secondary | ICD-10-CM | POA: Diagnosis not present

## 2016-11-08 DIAGNOSIS — C9211 Chronic myeloid leukemia, BCR/ABL-positive, in remission: Secondary | ICD-10-CM

## 2016-11-08 DIAGNOSIS — C9210 Chronic myeloid leukemia, BCR/ABL-positive, not having achieved remission: Secondary | ICD-10-CM

## 2016-11-08 LAB — COMPREHENSIVE METABOLIC PANEL
ALT: 41 U/L (ref 0–55)
ANION GAP: 8 meq/L (ref 3–11)
AST: 29 U/L (ref 5–34)
Albumin: 3 g/dL — ABNORMAL LOW (ref 3.5–5.0)
Alkaline Phosphatase: 87 U/L (ref 40–150)
BUN: 11.6 mg/dL (ref 7.0–26.0)
CO2: 26 mEq/L (ref 22–29)
Calcium: 8.9 mg/dL (ref 8.4–10.4)
Chloride: 109 mEq/L (ref 98–109)
Creatinine: 1.1 mg/dL (ref 0.7–1.3)
EGFR: 78 mL/min/{1.73_m2} — AB (ref >90)
Glucose: 103 mg/dl (ref 70–140)
POTASSIUM: 3.5 meq/L (ref 3.5–5.1)
Sodium: 143 mEq/L (ref 136–145)
Total Bilirubin: 0.33 mg/dL (ref 0.20–1.20)
Total Protein: 5.5 g/dL — ABNORMAL LOW (ref 6.4–8.3)

## 2016-11-08 LAB — CBC WITH DIFFERENTIAL/PLATELET
BASO%: 0.4 % (ref 0.0–2.0)
BASOS ABS: 0 10*3/uL (ref 0.0–0.1)
EOS ABS: 0.1 10*3/uL (ref 0.0–0.5)
EOS%: 0.9 % (ref 0.0–7.0)
HCT: 33.6 % — ABNORMAL LOW (ref 38.4–49.9)
HGB: 10.6 g/dL — ABNORMAL LOW (ref 13.0–17.1)
LYMPH%: 8.9 % — AB (ref 14.0–49.0)
MCH: 26.5 pg — AB (ref 27.2–33.4)
MCHC: 31.7 g/dL — AB (ref 32.0–36.0)
MCV: 83.6 fL (ref 79.3–98.0)
MONO#: 0.2 10*3/uL (ref 0.1–0.9)
MONO%: 2.9 % (ref 0.0–14.0)
NEUT#: 6.7 10*3/uL — ABNORMAL HIGH (ref 1.5–6.5)
NEUT%: 86.9 % — AB (ref 39.0–75.0)
PLATELETS: 190 10*3/uL (ref 140–400)
RBC: 4.01 10*6/uL — AB (ref 4.20–5.82)
RDW: 17.5 % — ABNORMAL HIGH (ref 11.0–14.6)
WBC: 7.8 10*3/uL (ref 4.0–10.3)
lymph#: 0.7 10*3/uL — ABNORMAL LOW (ref 0.9–3.3)

## 2016-11-08 MED ORDER — HYDRALAZINE HCL 50 MG PO TABS: 75.0000 mg | ORAL_TABLET | Freq: Three times a day (TID) | ORAL | 0 refills | Status: DC

## 2016-11-08 MED ORDER — MAGNESIUM OXIDE 400 (241.3 MG) MG PO TABS
400.0000 mg | ORAL_TABLET | Freq: Every day | ORAL | 0 refills | Status: AC
Start: 2016-11-08 — End: 2016-12-08

## 2016-11-08 MED ORDER — PANTOPRAZOLE SODIUM 40 MG PO TBEC: 40.0000 mg | DELAYED_RELEASE_TABLET | Freq: Every day | ORAL | 0 refills | Status: DC

## 2016-11-08 MED ORDER — DIPHENOXYLATE-ATROPINE 2.5-0.025 MG PO TABS: ORAL_TABLET | ORAL | 0 refills | Status: DC

## 2016-11-08 NOTE — Progress Notes (Signed)
[place] OFFICE PROGRESS NOTE   Diagnosis: CML  INTERVAL HISTORY:   He reports improvement in the gout symptoms. He is not drinking alcohol. He is taking Gleevec.  Objective:  Vital signs in last 24 hours:  Blood pressure (!) 148/76, pulse 88, temperature 97.8 F (36.6 C), temperature source Oral, resp. rate 18, height 6' (1.829 m), weight 157 lb 1.6 oz (71.3 kg), SpO2 98 %.    HEENT: No thrush or ulcers Resp: Decreased breath sounds at the right lower chest, no respiratory distress Cardio: Regular rate and rhythm GI: No hepatomegaly Vascular: No left leg edema Skin: No rash   Portacath/PICC-without erythema  Lab Results:  Lab Results  Component Value Date   WBC 7.8 11/08/2016   HGB 10.6 (L) 11/08/2016   HCT 33.6 (L) 11/08/2016   MCV 83.6 11/08/2016   PLT 190 11/08/2016   NEUTROABS 6.7 (H) 11/08/2016    CMP     Component Value Date/Time   NA 143 11/08/2016 1105   K 3.5 11/08/2016 1105   CL 108 10/07/2016 0727   CL 105 08/07/2012 1339   CO2 26 11/08/2016 1105   GLUCOSE 103 11/08/2016 1105   GLUCOSE 115 (H) 08/07/2012 1339   BUN 11.6 11/08/2016 1105   CREATININE 1.1 11/08/2016 1105   CALCIUM 8.9 11/08/2016 1105   PROT 5.5 (L) 11/08/2016 1105   ALBUMIN 3.0 (L) 11/08/2016 1105   AST 29 11/08/2016 1105   ALT 41 11/08/2016 1105   ALKPHOS 87 11/08/2016 1105   BILITOT 0.33 11/08/2016 1105   GFRNONAA 54 (L) 10/07/2016 0727   GFRAA >60 10/07/2016 0727    Medications: I have reviewed the patient's current medications.  Assessment/Plan: 1. Chronic myelogenous leukemia, diagnosed in January of 2009. He remains in hematologic remission. He is taking Gleevec at a dose of 200 mg daily. The peripheral blood PCR was markedly increased in May 2013, likely reflecting medical noncompliance. The peripheral blood PCR was significantly improved on 11/07/2011. The peripheral blood PCR was further improved on 01/31/2012. The peripheral blood PCR was increased  on 04/27/2012; stable on 11/12/2012, improved on 02/12/2013; increased on 04/29/2013; increased on 07/10/2013; improved on 09/16/2013; improved on 10/28/2013; increased 02/17/2014; slightly improved 03/28/2014. Stable 04/29/2014. Increased 05/30/2014. Improved 07/26/2014. Increased 07/14/2015.Improved August 2017, stable October 2017.Increased on 07/05/2016 2. 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 3. Status post C3-C4, C4-C5, anterior cervical diskectomy and fusion with allograft and plating 09/30/2010. 4. 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 5. Depression 6. Diarrhea, ? related to chronic pancreatitis versus Gleevec. He takes Lomotil as needed.  7. Indurated facial skin lesion 11/20/2007 with a history of MRSA skin infection of the submental area in May 2008. The induration resolved with doxycycline. 8. Left knee arthroscopy 05/25/2007. 9. Postoperative left knee effusion/pain, likely related to gout. 10. History of gout.  11. Chronic pancreatitis. 12. Status post right above-the-knee amputation. 13. MRSA infection of the submental area May 2008. 14. History of tobacco, alcohol, and cocaine use. 15. History of coronary artery disease. 16. "Shotty" lymphadenopathy of the neck, axilla, and left groin in 2009.  17. History of a microcytic anemia.  18. History of a right olecranon bursa lesion, ? gouty tophus. 19. 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 He previously took AndroGel. 20. History of anemia secondary to chronic disease and Gleevec therapy.  21. history ofAnorexia-potentially related to [place]. He reports Medicaid would not pay for Megace.  22. Chronic left  knee and left foot pain.  23. Status post removal of a left knee screw 07/03/2012. 24. History of Hypokalemia. Likely related to diarrhea. 25. Status post left foot surgery. 26. Pulsatile fullness right neck. Evaluated by vascular surgery status  post CT angiogram 03/07/2014 with no carotid artery aneurysm identified. 27. Cough-abnormal lung exam 05/12/2015. He completed a course of Levaquin. Chest xray negative. 28. Hospitalization 09/20/2016 through 10/07/2016 with unresponsiveness/acute metabolic encephalopathy   Disposition:  He has recovered from the hospital admission in May. He will continue Gleevec. The anemia has improved over the past month. We will follow-up on the peripheral blood PCR from today. The plan is to continue Gleevec at the current dose. The plan is to switch to a different tyrosine kinase if he comes out of hematologic remission or there is a consistent rise in the peripheral blood PCR.  Mr. Coppa will return for an office and lab visit in one month.    Donneta Romberg, MD  11/08/2016  6:14 PM

## 2016-11-11 ENCOUNTER — Telehealth: Payer: Self-pay | Admitting: Oncology

## 2016-11-11 NOTE — Telephone Encounter (Signed)
Message left on voicemail, per 11/08/16 los. Appointment letter and schedule mailed to patient, per 11/08/16 los.

## 2016-11-15 ENCOUNTER — Other Ambulatory Visit: Payer: Self-pay | Admitting: Oncology

## 2016-11-15 ENCOUNTER — Other Ambulatory Visit (INDEPENDENT_AMBULATORY_CARE_PROVIDER_SITE_OTHER): Payer: Self-pay | Admitting: Orthopedic Surgery

## 2016-11-15 DIAGNOSIS — C9210 Chronic myeloid leukemia, BCR/ABL-positive, not having achieved remission: Secondary | ICD-10-CM

## 2016-11-15 DIAGNOSIS — M109 Gout, unspecified: Secondary | ICD-10-CM

## 2016-11-16 ENCOUNTER — Ambulatory Visit (INDEPENDENT_AMBULATORY_CARE_PROVIDER_SITE_OTHER): Payer: Medicare Other | Admitting: Orthopaedic Surgery

## 2016-11-16 DIAGNOSIS — M1A09X0 Idiopathic chronic gout, multiple sites, without tophus (tophi): Secondary | ICD-10-CM | POA: Diagnosis not present

## 2016-11-16 DIAGNOSIS — M7021 Olecranon bursitis, right elbow: Secondary | ICD-10-CM | POA: Diagnosis not present

## 2016-11-16 MED ORDER — METHYLPREDNISOLONE 4 MG PO TABS: ORAL_TABLET | ORAL | 0 refills | Status: DC
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
## 2016-11-28 ENCOUNTER — Telehealth (INDEPENDENT_AMBULATORY_CARE_PROVIDER_SITE_OTHER): Payer: Self-pay | Admitting: *Deleted

## 2016-11-28 MED ORDER — COLCHICINE 0.6 MG PO TABS: 0.6000 mg | ORAL_TABLET | Freq: Two times a day (BID) | ORAL | 0 refills | Status: DC

## 2016-11-28 NOTE — Telephone Encounter (Signed)
Patient called stating the antibiotic is not helping, patient states his foot just keeps getting bigger and bigger from the swelling. Please advise [phone]

## 2016-11-28 NOTE — Telephone Encounter (Signed)
Please advise 

## 2016-11-28 NOTE — Telephone Encounter (Signed)
Patient aware Rx sent to pharmacy. °

## 2016-11-28 NOTE — Telephone Encounter (Signed)
I sent him another gout medication to his pharmacy which is [facility] on [address]. They will need to get with his primary care physician for further gout treatment.

## 2016-11-29 ENCOUNTER — Telehealth (INDEPENDENT_AMBULATORY_CARE_PROVIDER_SITE_OTHER): Payer: Self-pay | Admitting: Orthopaedic Surgery

## 2016-11-29 NOTE — Telephone Encounter (Signed)
He can see if he like to try some tramadol 50 mg 1-2 every 8 hours as needed for pain #60 with no refills.

## 2016-11-29 NOTE — Telephone Encounter (Signed)
He's at his PCP at the time I called him and states he will get something from them

## 2016-11-29 NOTE — Telephone Encounter (Signed)
PT REQUESTED A CALL BACK TO DISCUSS MEDS. STATED HE ALREADY TAKES WHAT HE WAS PRESCRIBED BY [facility] AND IT DOESN'T HELP.  PLEASE ADVISE.  [phone]

## 2016-11-29 NOTE — Telephone Encounter (Signed)
Please advise..different medication?

## 2016-12-06 ENCOUNTER — Other Ambulatory Visit: Payer: Self-pay

## 2016-12-06 ENCOUNTER — Ambulatory Visit: Payer: Self-pay | Admitting: Nurse Practitioner

## 2016-12-11 ENCOUNTER — Encounter (HOSPITAL_COMMUNITY): Payer: Self-pay

## 2016-12-11 ENCOUNTER — Emergency Department (HOSPITAL_COMMUNITY)
Admission: EM | Admit: 2016-12-11 | Discharge: 2016-12-11 | Disposition: A | Discharge status: Home / Self Care | Payer: Medicare Other | Attending: Emergency Medicine | Admitting: Emergency Medicine

## 2016-12-11 DIAGNOSIS — I10 Essential (primary) hypertension: Secondary | ICD-10-CM | POA: Diagnosis not present

## 2016-12-11 DIAGNOSIS — Z7982 Long term (current) use of aspirin: Secondary | ICD-10-CM | POA: Insufficient documentation

## 2016-12-11 DIAGNOSIS — F17210 Nicotine dependence, cigarettes, uncomplicated: Secondary | ICD-10-CM | POA: Insufficient documentation

## 2016-12-11 DIAGNOSIS — M1A9XX0 Chronic gout, unspecified, without tophus (tophi): Secondary | ICD-10-CM | POA: Diagnosis not present

## 2016-12-11 DIAGNOSIS — M109 Gout, unspecified: Secondary | ICD-10-CM | POA: Diagnosis present

## 2016-12-11 DIAGNOSIS — Z79899 Other long term (current) drug therapy: Secondary | ICD-10-CM | POA: Diagnosis not present

## 2016-12-11 LAB — CBC
HCT: 28.4 % — ABNORMAL LOW (ref 39.0–52.0)
HEMOGLOBIN: 9.1 g/dL — AB (ref 13.0–17.0)
MCH: 25.4 pg — ABNORMAL LOW (ref 26.0–34.0)
MCHC: 32 g/dL (ref 30.0–36.0)
MCV: 79.3 fL (ref 78.0–100.0)
Platelets: 222 10*3/uL (ref 150–400)
RBC: 3.58 MIL/uL — AB (ref 4.22–5.81)
RDW: 16.7 % — ABNORMAL HIGH (ref 11.5–15.5)
WBC: 7 10*3/uL (ref 4.0–10.5)

## 2016-12-11 LAB — BASIC METABOLIC PANEL
ANION GAP: 8 (ref 5–15)
BUN: 12 mg/dL (ref 6–20)
CHLORIDE: 108 mmol/L (ref 101–111)
CO2: 26 mmol/L (ref 22–32)
CREATININE: 1.33 mg/dL — AB (ref 0.61–1.24)
Calcium: 8.4 mg/dL — ABNORMAL LOW (ref 8.9–10.3)
GFR calc non Af Amer: 52 mL/min — ABNORMAL LOW (ref >60)
Glucose, Bld: 123 mg/dL — ABNORMAL HIGH (ref 65–99)
Potassium: 3 mmol/L — ABNORMAL LOW (ref 3.5–5.1)
SODIUM: 142 mmol/L (ref 135–145)

## 2016-12-11 MED ORDER — HYDROMORPHONE HCL 1 MG/ML IJ SOLN
0.5000 mg | INTRAMUSCULAR | Status: DC | PRN
Start: 2016-12-11 — End: 2016-12-11
  Administered 2016-12-11 (×2): 0.5 mg via INTRAVENOUS
  Filled 2016-12-11 (×3): qty 1

## 2016-12-11 MED ORDER — POTASSIUM CHLORIDE CRYS ER 20 MEQ PO TBCR
40.0000 meq | EXTENDED_RELEASE_TABLET | Freq: Once | ORAL | Status: AC
  Administered 2016-12-11: 40 meq via ORAL
  Filled 2016-12-11: qty 2

## 2016-12-11 MED ORDER — PREDNISONE 10 MG (21) PO TBPK: ORAL_TABLET | ORAL | 0 refills | Status: DC

## 2016-12-11 NOTE — ED Notes (Signed)
Bed: PS88 Expected date:  Expected time:  Means of arrival:  Comments: 71 yo gout
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
## 2016-12-11 NOTE — ED Notes (Signed)
I have just called [facility] per his request to transport him home.

## 2016-12-11 NOTE — ED Triage Notes (Signed)
He c/o gout-related pain in bilat. Hands, elbows and left leg and foot. He is s/p right a.k.a. He is in no distress. He has seen his pcp a few days ago, "but nothing is helping".

## 2016-12-11 NOTE — Discharge Instructions (Signed)
Follow-up with your primary care doctor and/or orthopedic doctor. Continue your oxycodone as well as your colchicine to help with her gout pain. Increase steroids as prescribed

## 2016-12-13 ENCOUNTER — Telehealth: Payer: Self-pay | Admitting: *Deleted

## 2016-12-13 ENCOUNTER — Telehealth: Payer: Self-pay | Admitting: Oncology

## 2016-12-13 NOTE — Telephone Encounter (Signed)
Left voicemail for patient regarding their upcoming appointments next week.

## 2016-12-13 NOTE — Telephone Encounter (Signed)
Call from pt to reschedule missed office visit. He reports periorbital edema, joint pain and swelling. Reviewed with Dr. [name]: See PCP for edema. May be related to his gout. Left message instructing pt to contact PCP. Message to schedulers to reschedule missed office visit.

## 2016-12-14 ENCOUNTER — Emergency Department (HOSPITAL_COMMUNITY)
Admission: EM | Admit: 2016-12-14 | Discharge: 2016-12-14 | Disposition: A | Discharge status: Home / Self Care | Payer: Medicare Other | Attending: Emergency Medicine | Admitting: Emergency Medicine

## 2016-12-14 ENCOUNTER — Emergency Department (HOSPITAL_COMMUNITY): Payer: Medicare Other

## 2016-12-14 ENCOUNTER — Encounter (HOSPITAL_COMMUNITY): Payer: Self-pay | Admitting: Emergency Medicine

## 2016-12-14 DIAGNOSIS — I10 Essential (primary) hypertension: Secondary | ICD-10-CM | POA: Insufficient documentation

## 2016-12-14 DIAGNOSIS — M1000 Idiopathic gout, unspecified site: Secondary | ICD-10-CM | POA: Insufficient documentation

## 2016-12-14 DIAGNOSIS — E876 Hypokalemia: Secondary | ICD-10-CM | POA: Diagnosis not present

## 2016-12-14 DIAGNOSIS — Z7982 Long term (current) use of aspirin: Secondary | ICD-10-CM | POA: Insufficient documentation

## 2016-12-14 DIAGNOSIS — F17210 Nicotine dependence, cigarettes, uncomplicated: Secondary | ICD-10-CM | POA: Insufficient documentation

## 2016-12-14 DIAGNOSIS — Z79899 Other long term (current) drug therapy: Secondary | ICD-10-CM | POA: Diagnosis not present

## 2016-12-14 DIAGNOSIS — M79605 Pain in left leg: Secondary | ICD-10-CM | POA: Diagnosis present

## 2016-12-14 DIAGNOSIS — R600 Localized edema: Secondary | ICD-10-CM | POA: Insufficient documentation

## 2016-12-14 DIAGNOSIS — R609 Edema, unspecified: Secondary | ICD-10-CM

## 2016-12-14 LAB — URINALYSIS, ROUTINE W REFLEX MICROSCOPIC
Bilirubin Urine: NEGATIVE
Glucose, UA: NEGATIVE mg/dL
HGB URINE DIPSTICK: NEGATIVE
Ketones, ur: NEGATIVE mg/dL
LEUKOCYTES UA: NEGATIVE
NITRITE: NEGATIVE
PH: 7 (ref 5.0–8.0)
Protein, ur: 100 mg/dL — AB
SPECIFIC GRAVITY, URINE: 1.009 (ref 1.005–1.030)
Squamous Epithelial / LPF: NONE SEEN

## 2016-12-14 LAB — COMPREHENSIVE METABOLIC PANEL
ALT: 18 U/L (ref 17–63)
ANION GAP: 9 (ref 5–15)
AST: 25 U/L (ref 15–41)
Albumin: 2.6 g/dL — ABNORMAL LOW (ref 3.5–5.0)
Alkaline Phosphatase: 75 U/L (ref 38–126)
BILIRUBIN TOTAL: 0.2 mg/dL — AB (ref 0.3–1.2)
BUN: 17 mg/dL (ref 6–20)
CHLORIDE: 106 mmol/L (ref 101–111)
CO2: 26 mmol/L (ref 22–32)
Calcium: 8.6 mg/dL — ABNORMAL LOW (ref 8.9–10.3)
Creatinine, Ser: 1.16 mg/dL (ref 0.61–1.24)
Glucose, Bld: 98 mg/dL (ref 65–99)
POTASSIUM: 3 mmol/L — AB (ref 3.5–5.1)
Sodium: 141 mmol/L (ref 135–145)
TOTAL PROTEIN: 5.9 g/dL — AB (ref 6.5–8.1)

## 2016-12-14 LAB — CBC WITH DIFFERENTIAL/PLATELET
BASOS ABS: 0 10*3/uL (ref 0.0–0.1)
Basophils Relative: 0 %
EOS PCT: 0 %
Eosinophils Absolute: 0.1 10*3/uL (ref 0.0–0.7)
HEMATOCRIT: 28.8 % — AB (ref 39.0–52.0)
Hemoglobin: 9.2 g/dL — ABNORMAL LOW (ref 13.0–17.0)
LYMPHS PCT: 14 %
Lymphs Abs: 1.5 10*3/uL (ref 0.7–4.0)
MCH: 25.1 pg — AB (ref 26.0–34.0)
MCHC: 31.9 g/dL (ref 30.0–36.0)
MCV: 78.5 fL (ref 78.0–100.0)
MONO ABS: 0.9 10*3/uL (ref 0.1–1.0)
MONOS PCT: 8 %
NEUTROS ABS: 8.7 10*3/uL — AB (ref 1.7–7.7)
Neutrophils Relative %: 78 %
PLATELETS: 315 10*3/uL (ref 150–400)
RBC: 3.67 MIL/uL — ABNORMAL LOW (ref 4.22–5.81)
RDW: 17 % — AB (ref 11.5–15.5)
WBC: 11.2 10*3/uL — ABNORMAL HIGH (ref 4.0–10.5)

## 2016-12-14 LAB — I-STAT TROPONIN, ED: Troponin i, poc: 0 ng/mL (ref 0.00–0.08)

## 2016-12-14 LAB — SALICYLATE LEVEL

## 2016-12-14 LAB — URIC ACID: URIC ACID, SERUM: 5 mg/dL (ref 4.4–7.6)

## 2016-12-14 LAB — BRAIN NATRIURETIC PEPTIDE: B NATRIURETIC PEPTIDE 5: 175.7 pg/mL — AB (ref 0.0–100.0)

## 2016-12-14 MED ORDER — POTASSIUM CHLORIDE CRYS ER 20 MEQ PO TBCR: 20.0000 meq | EXTENDED_RELEASE_TABLET | Freq: Every day | ORAL | 0 refills | Status: DC

## 2016-12-14 MED ORDER — OXYCODONE-ACETAMINOPHEN 5-325 MG PO TABS: 1.0000 | ORAL_TABLET | Freq: Four times a day (QID) | ORAL | 0 refills | Status: DC | PRN

## 2016-12-14 MED ORDER — FUROSEMIDE 10 MG/ML IJ SOLN
40.0000 mg | Freq: Once | INTRAMUSCULAR | Status: AC
  Administered 2016-12-14: 40 mg via INTRAVENOUS
  Filled 2016-12-14: qty 4

## 2016-12-14 MED ORDER — POTASSIUM CHLORIDE CRYS ER 20 MEQ PO TBCR
40.0000 meq | EXTENDED_RELEASE_TABLET | Freq: Once | ORAL | Status: AC
  Administered 2016-12-14: 40 meq via ORAL
  Filled 2016-12-14: qty 2

## 2016-12-14 MED ORDER — MORPHINE SULFATE (PF) 4 MG/ML IV SOLN
4.0000 mg | Freq: Once | INTRAVENOUS | Status: AC
Start: 2016-12-14 — End: 2016-12-14
  Administered 2016-12-14: 4 mg via INTRAVENOUS
  Filled 2016-12-14: qty 1

## 2016-12-14 MED ORDER — FUROSEMIDE 20 MG PO TABS: 20.0000 mg | ORAL_TABLET | Freq: Every day | ORAL | 0 refills | Status: DC
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
## 2016-12-14 NOTE — ED Triage Notes (Signed)
Per [facility] patient from home for bilat leg swelling and swelling around eyes that started Sunday and was seen here for it and was given Prednisone but hasnt helped with swelling.

## 2016-12-14 NOTE — ED Notes (Signed)
Patient was alert, oriented and stable upon discharge. RN went over AVS and patient had no further questions.  

## 2016-12-14 NOTE — Progress Notes (Signed)
[name] is overwhelmed and has trouble keeping track of all of the medications he is prescribed. He asked for help in managing his medications. Notify pharmacy if we can be of further assistance.   Romeo Rabon, PharmD, pager [phone]. 12/14/2016,10:58 AM.

## 2016-12-14 NOTE — Discharge Instructions (Signed)
Take lasix 20 mg daily x 3 days.   Take potassium daily as well.   Continue your current meds for gout.   Add percocet for severe pain.   See your doctor to repeat potassium level next week.   See your orthopedic doctor regarding your gout   Return to ER if you have worse foot pain, eye or leg swelling, fever, vomiting, chest pain.

## 2016-12-19 ENCOUNTER — Ambulatory Visit (INDEPENDENT_AMBULATORY_CARE_PROVIDER_SITE_OTHER): Payer: Self-pay | Admitting: Orthopaedic Surgery

## 2016-12-20 ENCOUNTER — Other Ambulatory Visit (HOSPITAL_BASED_OUTPATIENT_CLINIC_OR_DEPARTMENT_OTHER): Payer: Medicare Other

## 2016-12-20 ENCOUNTER — Other Ambulatory Visit: Payer: Self-pay | Admitting: Emergency Medicine

## 2016-12-20 ENCOUNTER — Ambulatory Visit (HOSPITAL_BASED_OUTPATIENT_CLINIC_OR_DEPARTMENT_OTHER): Payer: Medicare Other | Admitting: Nurse Practitioner

## 2016-12-20 VITALS — BP 168/95 | HR 72 | Temp 98.7°F | Resp 18 | Ht 72.0 in | Wt 154.1 lb

## 2016-12-20 DIAGNOSIS — M79672 Pain in left foot: Secondary | ICD-10-CM

## 2016-12-20 DIAGNOSIS — M25562 Pain in left knee: Secondary | ICD-10-CM | POA: Diagnosis not present

## 2016-12-20 DIAGNOSIS — C9211 Chronic myeloid leukemia, BCR/ABL-positive, in remission: Secondary | ICD-10-CM | POA: Diagnosis present

## 2016-12-20 DIAGNOSIS — K861 Other chronic pancreatitis: Secondary | ICD-10-CM | POA: Diagnosis not present

## 2016-12-20 DIAGNOSIS — C9210 Chronic myeloid leukemia, BCR/ABL-positive, not having achieved remission: Secondary | ICD-10-CM

## 2016-12-20 LAB — CBC WITH DIFFERENTIAL/PLATELET
BASO%: 0.1 % (ref 0.0–2.0)
Basophils Absolute: 0 10*3/uL (ref 0.0–0.1)
EOS%: 0.2 % (ref 0.0–7.0)
Eosinophils Absolute: 0 10*3/uL (ref 0.0–0.5)
HEMATOCRIT: 33.2 % — AB (ref 38.4–49.9)
HEMOGLOBIN: 10.4 g/dL — AB (ref 13.0–17.1)
LYMPH%: 8.7 % — AB (ref 14.0–49.0)
MCH: 25.1 pg — AB (ref 27.2–33.4)
MCHC: 31.3 g/dL — AB (ref 32.0–36.0)
MCV: 80 fL (ref 79.3–98.0)
MONO#: 1 10*3/uL — ABNORMAL HIGH (ref 0.1–0.9)
MONO%: 5.3 % (ref 0.0–14.0)
NEUT%: 85.7 % — ABNORMAL HIGH (ref 39.0–75.0)
NEUTROS ABS: 16 10*3/uL — AB (ref 1.5–6.5)
NRBC: 0 % (ref 0–0)
Platelets: 378 10*3/uL (ref 140–400)
RBC: 4.15 10*6/uL — AB (ref 4.20–5.82)
RDW: 17.4 % — AB (ref 11.0–14.6)
WBC: 18.6 10*3/uL — AB (ref 4.0–10.3)
lymph#: 1.6 10*3/uL (ref 0.9–3.3)

## 2016-12-20 LAB — COMPREHENSIVE METABOLIC PANEL
ALT: 27 U/L (ref 0–55)
ANION GAP: 12 meq/L — AB (ref 3–11)
AST: 21 U/L (ref 5–34)
Albumin: 2.4 g/dL — ABNORMAL LOW (ref 3.5–5.0)
Alkaline Phosphatase: 93 U/L (ref 40–150)
BUN: 15.5 mg/dL (ref 7.0–26.0)
CHLORIDE: 107 meq/L (ref 98–109)
CO2: 24 meq/L (ref 22–29)
CREATININE: 1.4 mg/dL — AB (ref 0.7–1.3)
Calcium: 8.9 mg/dL (ref 8.4–10.4)
EGFR: 57 mL/min/{1.73_m2} — ABNORMAL LOW (ref >90)
Glucose: 152 mg/dl — ABNORMAL HIGH (ref 70–140)
POTASSIUM: 3.1 meq/L — AB (ref 3.5–5.1)
Sodium: 143 mEq/L (ref 136–145)
Total Bilirubin: <0.22 mg/dL (ref 0.20–1.20)
Total Protein: 5.1 g/dL — ABNORMAL LOW (ref 6.4–8.3)

## 2016-12-20 LAB — TECHNOLOGIST REVIEW

## 2016-12-20 MED ORDER — DIPHENOXYLATE-ATROPINE 2.5-0.025 MG PO TABS: ORAL_TABLET | ORAL | 0 refills | Status: DC

## 2016-12-20 MED ORDER — POTASSIUM CHLORIDE CRYS ER 20 MEQ PO TBCR: 20.0000 meq | EXTENDED_RELEASE_TABLET | Freq: Every day | ORAL | 1 refills | Status: DC

## 2016-12-20 NOTE — Progress Notes (Signed)
[facility] OFFICE PROGRESS NOTE   Diagnosis:  CML  INTERVAL HISTORY:   [name] returns for follow-up. He reports overall he is feeling better. Gout symptoms have improved. No significant nausea. Occasional loose stools. He takes Lomotil as needed. He reports good compliance with Gleevec stating that he rarely misses a dose.   Objective:  Vital signs in last 24 hours:  Blood pressure (!) 168/95, pulse 72, temperature 98.7 F (37.1 C), temperature source Oral, resp. rate 18, height 6' (1.829 m), weight 154 lb 1.6 oz (69.9 kg), SpO2 99 %.    HEENT: No thrush or ulcers. Resp: Lungs clear bilaterally. Cardio: Regular rate and rhythm. GI: No hepatosplenomegaly. Vascular: No left leg edema.   Lab Results:  Lab Results  Component Value Date   WBC 18.6 (H) 12/20/2016   HGB 10.4 (L) 12/20/2016   HCT 33.2 (L) 12/20/2016   MCV 80.0 12/20/2016   PLT 378 12/20/2016   NEUTROABS 16.0 (H) 12/20/2016    Imaging:  No results found.  Medications: I have reviewed the patient's current medications.  Assessment/Plan: 1. Chronic myelogenous leukemia, diagnosed in January of 2009. He remains in hematologic remission. He is taking Gleevec at a dose of 200 mg daily. The peripheral blood PCR was markedly increased in May 2013, likely reflecting medical noncompliance. The peripheral blood PCR was significantly improved on 11/07/2011. The peripheral blood PCR was further improved on 01/31/2012. The peripheral blood PCR was increased on 04/27/2012; stable on 11/12/2012, improved on 02/12/2013; increased on 04/29/2013; increased on 07/10/2013; improved on 09/16/2013; improved on 10/28/2013; increased 02/17/2014; slightly improved 03/28/2014. Stable 04/29/2014. Increased 05/30/2014. Improved 07/26/2014. Increased 07/14/2015.Improved August 2017, stable October 2017. Increased on 07/05/2016. Increased on 11/08/2016. 2. 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 3. Status post  C3-C4, C4-C5, anterior cervical diskectomy and fusion with allograft and plating 09/30/2010. 4. 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 5. Depression 6. Diarrhea, ? related to chronic pancreatitis versus Gleevec. He takes Lomotil as needed.  7. Indurated facial skin lesion 11/20/2007 with a history of MRSA skin infection of the submental area in May 2008. The induration resolved with doxycycline. 8. Left knee arthroscopy 05/25/2007. 9. Postoperative left knee effusion/pain, likely related to gout. 10. History of gout.  11. Chronic pancreatitis. 12. Status post right above-the-knee amputation. 13. MRSA infection of the submental area May 2008. 14. History of tobacco, alcohol, and cocaine use. 15. History of coronary artery disease. 16. "Shotty" lymphadenopathy of the neck, axilla, and left groin in 2009.  17. History of a microcytic anemia.  18. History of a right olecranon bursa lesion, ? gouty tophus. 19. 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 He previously took AndroGel. 20. History of anemia secondary to chronic disease and Gleevec therapy.  21. history of anorexia-potentially related to [place]. He reports Medicaid would not pay for Megace.  22. Chronic left knee and left foot pain.  23. Status post removal of a left knee screw 07/03/2012. 24. History of hypokalemia. Likely related to diarrhea. He is on a potassium supplement. 25. Status post left foot surgery. 26. Pulsatile fullness right neck. Evaluated by vascular surgery status post CT angiogram 03/07/2014 with no carotid artery aneurysm identified. 27. Cough-abnormal lung exam 05/12/2015. He completed a course of Levaquin. Chest xray negative. 28. Hospitalization 09/20/2016 through 10/07/2016 with unresponsiveness/acute metabolic encephalopathy   Disposition: [name] appears unchanged. He is currently taking Gleevec 400 mg daily. The peripheral blood PCR  from last month was further increased. We will follow-up on the value from today. The elevated white count may be related to steroids.  He is having considerable confusion regarding his medications. There are close to 40 medications on his list today. We prescribe Gleevec, Lomotil and potassium. I refilled the Lomotil and potassium today. We are contacting Dr. Santiago Bur office to arrange for an appointment to review his medication list with him and determine what he should and should not be taking.  He will return for labs and a follow-up visit in 3-4 weeks. He will contact the office in the interim with any problems.  Plan reviewed with Dr. [name].  [name] ANP/GNP-BC   12/20/2016  3:22 PM

## 2016-12-21 ENCOUNTER — Telehealth: Payer: Self-pay | Admitting: Nurse Practitioner

## 2016-12-21 NOTE — Telephone Encounter (Signed)
Scheduled appt per 8/14 - patient is aware of appt time and date - called and confirmed.

## 2017-01-07 ENCOUNTER — Emergency Department (HOSPITAL_COMMUNITY): Payer: Medicare Other

## 2017-01-07 ENCOUNTER — Encounter (HOSPITAL_COMMUNITY): Payer: Self-pay | Admitting: Emergency Medicine

## 2017-01-07 ENCOUNTER — Emergency Department (HOSPITAL_COMMUNITY)
Admission: EM | Admit: 2017-01-07 | Discharge: 2017-01-07 | Disposition: A | Discharge status: Home / Self Care | Payer: Medicare Other | Attending: Emergency Medicine | Admitting: Emergency Medicine

## 2017-01-07 DIAGNOSIS — R197 Diarrhea, unspecified: Secondary | ICD-10-CM | POA: Insufficient documentation

## 2017-01-07 DIAGNOSIS — R1033 Periumbilical pain: Secondary | ICD-10-CM | POA: Diagnosis not present

## 2017-01-07 DIAGNOSIS — R1084 Generalized abdominal pain: Secondary | ICD-10-CM

## 2017-01-07 DIAGNOSIS — I252 Old myocardial infarction: Secondary | ICD-10-CM | POA: Insufficient documentation

## 2017-01-07 DIAGNOSIS — R112 Nausea with vomiting, unspecified: Secondary | ICD-10-CM | POA: Diagnosis present

## 2017-01-07 DIAGNOSIS — Z96652 Presence of left artificial knee joint: Secondary | ICD-10-CM | POA: Insufficient documentation

## 2017-01-07 DIAGNOSIS — R1110 Vomiting, unspecified: Secondary | ICD-10-CM

## 2017-01-07 DIAGNOSIS — F17200 Nicotine dependence, unspecified, uncomplicated: Secondary | ICD-10-CM | POA: Diagnosis not present

## 2017-01-07 DIAGNOSIS — Z79899 Other long term (current) drug therapy: Secondary | ICD-10-CM | POA: Insufficient documentation

## 2017-01-07 DIAGNOSIS — Z7982 Long term (current) use of aspirin: Secondary | ICD-10-CM | POA: Diagnosis not present

## 2017-01-07 DIAGNOSIS — E876 Hypokalemia: Secondary | ICD-10-CM

## 2017-01-07 DIAGNOSIS — I10 Essential (primary) hypertension: Secondary | ICD-10-CM | POA: Insufficient documentation

## 2017-01-07 LAB — COMPREHENSIVE METABOLIC PANEL
ALBUMIN: 2.3 g/dL — AB (ref 3.5–5.0)
ALK PHOS: 103 U/L (ref 38–126)
ALT: 31 U/L (ref 17–63)
ANION GAP: 9 (ref 5–15)
AST: 40 U/L (ref 15–41)
BILIRUBIN TOTAL: 0.4 mg/dL (ref 0.3–1.2)
BUN: 7 mg/dL (ref 6–20)
CALCIUM: 8.6 mg/dL — AB (ref 8.9–10.3)
CO2: 27 mmol/L (ref 22–32)
CREATININE: 1.3 mg/dL — AB (ref 0.61–1.24)
Chloride: 106 mmol/L (ref 101–111)
GFR calc non Af Amer: 54 mL/min — ABNORMAL LOW (ref >60)
Glucose, Bld: 97 mg/dL (ref 65–99)
Potassium: 2.7 mmol/L — CL (ref 3.5–5.1)
Sodium: 142 mmol/L (ref 135–145)
TOTAL PROTEIN: 5 g/dL — AB (ref 6.5–8.1)

## 2017-01-07 LAB — URINALYSIS, ROUTINE W REFLEX MICROSCOPIC
Bacteria, UA: NONE SEEN
Bilirubin Urine: NEGATIVE
GLUCOSE, UA: NEGATIVE mg/dL
HGB URINE DIPSTICK: NEGATIVE
Ketones, ur: NEGATIVE mg/dL
Leukocytes, UA: NEGATIVE
NITRITE: NEGATIVE
PH: 6 (ref 5.0–8.0)
Specific Gravity, Urine: 1.014 (ref 1.005–1.030)
Squamous Epithelial / LPF: NONE SEEN

## 2017-01-07 LAB — CBC
HCT: 34.2 % — ABNORMAL LOW (ref 39.0–52.0)
HEMOGLOBIN: 10.9 g/dL — AB (ref 13.0–17.0)
MCH: 23.9 pg — ABNORMAL LOW (ref 26.0–34.0)
MCHC: 31.9 g/dL (ref 30.0–36.0)
MCV: 75 fL — ABNORMAL LOW (ref 78.0–100.0)
PLATELETS: 304 10*3/uL (ref 150–400)
RBC: 4.56 MIL/uL (ref 4.22–5.81)
RDW: 17.8 % — ABNORMAL HIGH (ref 11.5–15.5)
WBC: 6.7 10*3/uL (ref 4.0–10.5)

## 2017-01-07 LAB — LIPASE, BLOOD: Lipase: 31 U/L (ref 11–51)

## 2017-01-07 MED ORDER — DICYCLOMINE HCL 20 MG PO TABS: 20.0000 mg | ORAL_TABLET | Freq: Two times a day (BID) | ORAL | 0 refills | Status: DC | PRN

## 2017-01-07 MED ORDER — POTASSIUM CHLORIDE 10 MEQ/100ML IV SOLN
10.0000 meq | INTRAVENOUS | Status: AC
  Administered 2017-01-07 (×3): 10 meq via INTRAVENOUS
  Filled 2017-01-07 (×2): qty 100

## 2017-01-07 MED ORDER — POTASSIUM CHLORIDE ER 10 MEQ PO TBCR: 20.0000 meq | EXTENDED_RELEASE_TABLET | Freq: Two times a day (BID) | ORAL | 0 refills | Status: DC

## 2017-01-07 MED ORDER — MAGNESIUM SULFATE 2 GM/50ML IV SOLN
2.0000 g | Freq: Once | INTRAVENOUS | Status: AC
  Administered 2017-01-07: 2 g via INTRAVENOUS
  Filled 2017-01-07: qty 50

## 2017-01-07 MED ORDER — ONDANSETRON HCL 4 MG/2ML IJ SOLN
4.0000 mg | Freq: Once | INTRAMUSCULAR | Status: AC
Start: 2017-01-07 — End: 2017-01-07
  Administered 2017-01-07: 4 mg via INTRAVENOUS
  Filled 2017-01-07: qty 2

## 2017-01-07 MED ORDER — ONDANSETRON 4 MG PO TBDP: 4.0000 mg | ORAL_TABLET | Freq: Three times a day (TID) | ORAL | 0 refills | Status: DC | PRN

## 2017-01-07 MED ORDER — DICYCLOMINE HCL 10 MG/ML IM SOLN
20.0000 mg | Freq: Once | INTRAMUSCULAR | Status: AC
  Administered 2017-01-07: 20 mg via INTRAMUSCULAR
  Filled 2017-01-07: qty 2

## 2017-01-07 MED ORDER — BARIUM SULFATE 2.1 % PO SUSP
ORAL | Status: AC
  Filled 2017-01-07: qty 2

## 2017-01-07 MED ORDER — SODIUM CHLORIDE 0.9 % IV BOLUS (SEPSIS)
1000.0000 mL | Freq: Once | INTRAVENOUS | Status: AC
  Administered 2017-01-07: 1000 mL via INTRAVENOUS
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
## 2017-01-07 NOTE — ED Notes (Signed)
Patient transported to CT 

## 2017-01-07 NOTE — ED Notes (Signed)
Dr Billy Fischer notified of critical potassium

## 2017-01-10 ENCOUNTER — Other Ambulatory Visit: Payer: Self-pay

## 2017-01-10 ENCOUNTER — Ambulatory Visit (HOSPITAL_BASED_OUTPATIENT_CLINIC_OR_DEPARTMENT_OTHER): Payer: Medicare Other | Admitting: Oncology

## 2017-01-10 ENCOUNTER — Other Ambulatory Visit (HOSPITAL_BASED_OUTPATIENT_CLINIC_OR_DEPARTMENT_OTHER): Payer: Medicare Other

## 2017-01-10 VITALS — BP 113/81 | HR 80 | Temp 97.9°F | Resp 17 | Ht 72.0 in | Wt 149.4 lb

## 2017-01-10 DIAGNOSIS — I10 Essential (primary) hypertension: Secondary | ICD-10-CM

## 2017-01-10 DIAGNOSIS — C9210 Chronic myeloid leukemia, BCR/ABL-positive, not having achieved remission: Secondary | ICD-10-CM

## 2017-01-10 DIAGNOSIS — D6489 Other specified anemias: Secondary | ICD-10-CM

## 2017-01-10 DIAGNOSIS — C9211 Chronic myeloid leukemia, BCR/ABL-positive, in remission: Secondary | ICD-10-CM

## 2017-01-10 DIAGNOSIS — R197 Diarrhea, unspecified: Secondary | ICD-10-CM | POA: Diagnosis not present

## 2017-01-10 DIAGNOSIS — D509 Iron deficiency anemia, unspecified: Secondary | ICD-10-CM

## 2017-01-10 LAB — CBC WITH DIFFERENTIAL/PLATELET
BASO%: 0.2 % (ref 0.0–2.0)
BASOS ABS: 0 10*3/uL (ref 0.0–0.1)
EOS%: 4.6 % (ref 0.0–7.0)
Eosinophils Absolute: 0.2 10*3/uL (ref 0.0–0.5)
HEMATOCRIT: 31.8 % — AB (ref 38.4–49.9)
HEMOGLOBIN: 10 g/dL — AB (ref 13.0–17.1)
LYMPH#: 1.2 10*3/uL (ref 0.9–3.3)
LYMPH%: 22.2 % (ref 14.0–49.0)
MCH: 24.4 pg — AB (ref 27.2–33.4)
MCHC: 31.4 g/dL — AB (ref 32.0–36.0)
MCV: 77.6 fL — AB (ref 79.3–98.0)
MONO#: 0.4 10*3/uL (ref 0.1–0.9)
MONO%: 8.1 % (ref 0.0–14.0)
NEUT#: 3.4 10*3/uL (ref 1.5–6.5)
NEUT%: 64.9 % (ref 39.0–75.0)
PLATELETS: 205 10*3/uL (ref 140–400)
RBC: 4.1 10*6/uL — ABNORMAL LOW (ref 4.20–5.82)
RDW: 18.4 % — AB (ref 11.0–14.6)
WBC: 5.2 10*3/uL (ref 4.0–10.3)

## 2017-01-10 LAB — COMPREHENSIVE METABOLIC PANEL
ALBUMIN: 2.5 g/dL — AB (ref 3.5–5.0)
ALT: 42 U/L (ref 0–55)
ANION GAP: 9 meq/L (ref 3–11)
AST: 49 U/L — AB (ref 5–34)
Alkaline Phosphatase: 122 U/L (ref 40–150)
BUN: 9.6 mg/dL (ref 7.0–26.0)
CALCIUM: 8.9 mg/dL (ref 8.4–10.4)
CHLORIDE: 111 meq/L — AB (ref 98–109)
CO2: 22 mEq/L (ref 22–29)
Creatinine: 1.7 mg/dL — ABNORMAL HIGH (ref 0.7–1.3)
EGFR: 46 mL/min/{1.73_m2} — AB (ref >90)
Glucose: 92 mg/dl (ref 70–140)
POTASSIUM: 3.4 meq/L — AB (ref 3.5–5.1)
Sodium: 142 mEq/L (ref 136–145)
Total Bilirubin: <0.22 mg/dL (ref 0.20–1.20)
Total Protein: 5.4 g/dL — ABNORMAL LOW (ref 6.4–8.3)

## 2017-01-10 MED ORDER — DIPHENOXYLATE-ATROPINE 2.5-0.025 MG PO TABS: ORAL_TABLET | ORAL | 0 refills | Status: DC
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
## 2017-01-10 NOTE — Telephone Encounter (Signed)
Called in lomotil to pharmacy

## 2017-01-13 ENCOUNTER — Telehealth: Payer: Self-pay | Admitting: Nurse Practitioner

## 2017-01-13 NOTE — Telephone Encounter (Signed)
Spoke to patient regarding upcoming October appointments. °

## 2017-01-24 ENCOUNTER — Other Ambulatory Visit: Payer: Self-pay | Admitting: *Deleted

## 2017-01-24 DIAGNOSIS — C9210 Chronic myeloid leukemia, BCR/ABL-positive, not having achieved remission: Secondary | ICD-10-CM

## 2017-01-24 MED ORDER — DIPHENOXYLATE-ATROPINE 2.5-0.025 MG PO TABS: ORAL_TABLET | ORAL | 0 refills | Status: DC

## 2017-02-01 ENCOUNTER — Other Ambulatory Visit: Payer: Self-pay

## 2017-02-01 DIAGNOSIS — C9210 Chronic myeloid leukemia, BCR/ABL-positive, not having achieved remission: Secondary | ICD-10-CM

## 2017-02-14 ENCOUNTER — Other Ambulatory Visit (HOSPITAL_BASED_OUTPATIENT_CLINIC_OR_DEPARTMENT_OTHER): Payer: Medicare Other

## 2017-02-14 ENCOUNTER — Ambulatory Visit (HOSPITAL_BASED_OUTPATIENT_CLINIC_OR_DEPARTMENT_OTHER): Payer: Medicare Other | Admitting: Nurse Practitioner

## 2017-02-14 ENCOUNTER — Telehealth: Payer: Self-pay

## 2017-02-14 VITALS — BP 149/94 | HR 78 | Temp 98.8°F | Resp 18 | Ht 72.0 in | Wt 150.2 lb

## 2017-02-14 DIAGNOSIS — C9210 Chronic myeloid leukemia, BCR/ABL-positive, not having achieved remission: Secondary | ICD-10-CM

## 2017-02-14 DIAGNOSIS — D509 Iron deficiency anemia, unspecified: Secondary | ICD-10-CM | POA: Diagnosis not present

## 2017-02-14 DIAGNOSIS — R197 Diarrhea, unspecified: Secondary | ICD-10-CM | POA: Diagnosis not present

## 2017-02-14 DIAGNOSIS — C9211 Chronic myeloid leukemia, BCR/ABL-positive, in remission: Secondary | ICD-10-CM | POA: Diagnosis present

## 2017-02-14 DIAGNOSIS — E876 Hypokalemia: Secondary | ICD-10-CM

## 2017-02-14 DIAGNOSIS — Z720 Tobacco use: Secondary | ICD-10-CM

## 2017-02-14 LAB — COMPREHENSIVE METABOLIC PANEL
ALBUMIN: 3.4 g/dL — AB (ref 3.5–5.0)
ALK PHOS: 112 U/L (ref 40–150)
ALT: 22 U/L (ref 0–55)
ANION GAP: 11 meq/L (ref 3–11)
AST: 29 U/L (ref 5–34)
BILIRUBIN TOTAL: 0.35 mg/dL (ref 0.20–1.20)
BUN: 16 mg/dL (ref 7.0–26.0)
CO2: 26 meq/L (ref 22–29)
Calcium: 9.3 mg/dL (ref 8.4–10.4)
Chloride: 106 mEq/L (ref 98–109)
Creatinine: 1.8 mg/dL — ABNORMAL HIGH (ref 0.7–1.3)
EGFR: 43 mL/min/{1.73_m2} — AB (ref >90)
GLUCOSE: 88 mg/dL (ref 70–140)
POTASSIUM: 3.3 meq/L — AB (ref 3.5–5.1)
SODIUM: 143 meq/L (ref 136–145)
Total Protein: 5.9 g/dL — ABNORMAL LOW (ref 6.4–8.3)

## 2017-02-14 LAB — CBC WITH DIFFERENTIAL/PLATELET
BASO%: 1.1 % (ref 0.0–2.0)
BASOS ABS: 0.1 10*3/uL (ref 0.0–0.1)
EOS ABS: 0.5 10*3/uL (ref 0.0–0.5)
EOS%: 9.1 % — ABNORMAL HIGH (ref 0.0–7.0)
HCT: 34.3 % — ABNORMAL LOW (ref 38.4–49.9)
HGB: 10.8 g/dL — ABNORMAL LOW (ref 13.0–17.1)
LYMPH%: 21.4 % (ref 14.0–49.0)
MCH: 24.2 pg — ABNORMAL LOW (ref 27.2–33.4)
MCHC: 31.5 g/dL — ABNORMAL LOW (ref 32.0–36.0)
MCV: 76.9 fL — AB (ref 79.3–98.0)
MONO#: 0.4 10*3/uL (ref 0.1–0.9)
MONO%: 7 % (ref 0.0–14.0)
NEUT#: 3.7 10*3/uL (ref 1.5–6.5)
NEUT%: 61.4 % (ref 39.0–75.0)
PLATELETS: 177 10*3/uL (ref 140–400)
RBC: 4.46 10*6/uL (ref 4.20–5.82)
RDW: 20.8 % — ABNORMAL HIGH (ref 11.0–14.6)
WBC: 6 10*3/uL (ref 4.0–10.3)
lymph#: 1.3 10*3/uL (ref 0.9–3.3)

## 2017-02-14 LAB — MAGNESIUM: Magnesium: 1.5 mg/dl (ref 1.5–2.5)

## 2017-02-14 NOTE — Progress Notes (Signed)
[place] OFFICE PROGRESS NOTE   Diagnosis:  CML  INTERVAL HISTORY:   [name] returns as scheduled. He continues [place]. The peripheral blood PCR returned at 3.024% on 01/10/2017. He reports good compliance with Gleevec. Diarrhea is controlled with Lomotil. No recent gout flares. He continues to smoke. He is interested in the lung cancer screening program.  Objective:  Vital signs in last 24 hours:  Blood pressure (!) 149/94, pulse 78, temperature 98.8 F (37.1 C), temperature source Oral, resp. rate 18, height 6' (1.829 m), weight 150 lb 3.2 oz (68.1 kg), SpO2 99 %.    HEENT: No thrush or ulcers. Resp: Lungs clear bilaterally. Cardio: Regular rate and rhythm. GI: No hepatomegaly. Vascular: No left leg edema.  Lab Results:  Lab Results  Component Value Date   WBC 6.0 02/14/2017   HGB 10.8 (L) 02/14/2017   HCT 34.3 (L) 02/14/2017   MCV 76.9 (L) 02/14/2017   PLT 177 02/14/2017   NEUTROABS 3.7 02/14/2017    Imaging:  No results found.  Medications: I have reviewed the patient's current medications.  Assessment/Plan: 1. Chronic myelogenous leukemia, diagnosed in January of 2009. He remains in hematologic remission. He is taking Gleevec at a dose of 200 mg daily. The peripheral blood PCR was markedly increased in May 2013, likely reflecting medical noncompliance. The peripheral blood PCR was significantly improved on 11/07/2011. The peripheral blood PCR was further improved on 01/31/2012. The peripheral blood PCR was increased on 04/27/2012; stable on 11/12/2012, improved on 02/12/2013; increased on 04/29/2013; increased on 07/10/2013; improved on 09/16/2013; improved on 10/28/2013; increased 02/17/2014; slightly improved 03/28/2014. Stable 04/29/2014. Increased 05/30/2014. Improved 07/26/2014. Increased 07/14/2015.Improved August 2017, stable October 2017. Increased on 07/05/2016. Improved a 14 2018  2. Status post left knee replacement  05/14/2010. 3. Status post C3-C4, C4-C5, anterior cervical diskectomy and fusion with allograft and plating 09/30/2010. 4. 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 5. Depression 6. Diarrhea, ? related to chronic pancreatitis versus Gleevec. He takes Lomotil as needed.  7. Indurated facial skin lesion 11/20/2007 with a history of MRSA skin infection of the submental area in May 2008. The induration resolved with doxycycline. 8. Left knee arthroscopy 05/25/2007. 9. Postoperative left knee effusion/pain, likely related to gout. 10. History of gout.  11. Chronic pancreatitis. 12. Status post right above-the-knee amputation. 13. MRSA infection of the submental area May 2008. 14. History of tobacco, alcohol, and cocaine use. 15. History of coronary artery disease. 16. "Shotty" lymphadenopathy of the neck, axilla, and left groin in 2009.  17. History of a microcytic anemia.  18. History of a right olecranon bursa lesion, ? gouty tophus. 19. 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 He previously took AndroGel. 20. History of anemia secondary to chronic disease and Gleevec therapy.  21. history of anorexia-potentially related to [place]. He reports Medicaid would not pay for Megace.  22. Chronic left knee and left foot pain.  23. Status post removal of a left knee screw 07/03/2012. 24. History of hypokalemia. Likely related to diarrhea.He is on a potassium supplement. 25. Status post left foot surgery. 26. Pulsatile fullness right neck. Evaluated by vascular surgery status post CT angiogram 03/07/2014 with no carotid artery aneurysm identified. 27. Cough-abnormal lung exam 05/12/2015. He completed a course of Levaquin. Chest xray negative. 28. Hospitalization 09/20/2016 through 10/07/2016 with unresponsiveness/acute metabolic encephalopathy   Disposition: [name] appears stable. The peripheral blood PCR from last month was  improved.  We will follow-up on the value from today. He will continue Gleevec.  He is interested in the lung cancer screening program. We made a referral at today's visit. We discussed smoking cessation.  We reviewed today's labs. He has a mild microcytic anemia. We will check iron studies. He has mild hypokalemia. This is likely due to chronic diarrhea. He will increase Kdur to 20 mEq twice a day for 3 days and then resume 20 mEq daily.  He will return for labs and a follow-up visit in one month. He will contact the office in the interim with any problems.  Plan reviewed with Dr. [name].  [name] ANP/GNP-BC   02/14/2017  2:56 PM

## 2017-02-14 NOTE — Telephone Encounter (Signed)
Printed avs and calender for upcoming appointment. Per 10/9 los

## 2017-02-20 ENCOUNTER — Other Ambulatory Visit: Payer: Self-pay | Admitting: *Deleted

## 2017-02-20 DIAGNOSIS — C9210 Chronic myeloid leukemia, BCR/ABL-positive, not having achieved remission: Secondary | ICD-10-CM

## 2017-02-20 MED ORDER — DIPHENOXYLATE-ATROPINE 2.5-0.025 MG PO TABS: ORAL_TABLET | ORAL | 0 refills | Status: DC

## 2017-02-20 NOTE — Telephone Encounter (Signed)
Received refill request via fax for pt's Lomotil. Reviewed with Dr. [name], order received and called to pharmacy.

## 2017-02-23 ENCOUNTER — Telehealth: Payer: Self-pay | Admitting: Acute Care

## 2017-02-23 DIAGNOSIS — Z122 Encounter for screening for malignant neoplasm of respiratory organs: Secondary | ICD-10-CM

## 2017-02-23 DIAGNOSIS — F17210 Nicotine dependence, cigarettes, uncomplicated: Principal | ICD-10-CM

## 2017-02-23 NOTE — Telephone Encounter (Signed)
Returning call for lung cancer screening.

## 2017-02-27 NOTE — Telephone Encounter (Signed)
Spoke with pt and scheduled SDMV 03/03/17 3:00 CT ordered  Nothing further needed

## 2017-02-27 NOTE — Telephone Encounter (Signed)
Will forward to the lung nodule pool 

## 2017-03-03 ENCOUNTER — Encounter: Payer: Self-pay | Admitting: Acute Care

## 2017-03-03 ENCOUNTER — Ambulatory Visit (INDEPENDENT_AMBULATORY_CARE_PROVIDER_SITE_OTHER): Payer: Medicare Other | Admitting: Acute Care

## 2017-03-03 ENCOUNTER — Ambulatory Visit (INDEPENDENT_AMBULATORY_CARE_PROVIDER_SITE_OTHER)
Admission: RE | Admit: 2017-03-03 | Discharge: 2017-03-03 | Disposition: A | Discharge status: Home / Self Care | Payer: Medicare Other | Source: Ambulatory Visit | Attending: Acute Care | Admitting: Acute Care

## 2017-03-03 DIAGNOSIS — Z87891 Personal history of nicotine dependence: Secondary | ICD-10-CM

## 2017-03-03 DIAGNOSIS — F17210 Nicotine dependence, cigarettes, uncomplicated: Secondary | ICD-10-CM

## 2017-03-03 DIAGNOSIS — Z122 Encounter for screening for malignant neoplasm of respiratory organs: Secondary | ICD-10-CM

## 2017-03-03 NOTE — Progress Notes (Signed)
Shared Decision Making Visit Lung Cancer Screening Program [phone])   Eligibility:  Age 71 y.o.  Pack Years Smoking History Calculation 35 pack-year smoking history (# packs/per year x # years smoked)  Recent History of coughing up blood no  Unexplained weight loss? no ( >Than 15 pounds within the last 6 months )  Prior History Lung / other cancer no (Diagnosis within the last 5 years already requiring surveillance chest CT Scans).  Smoking Status Current Smoker  Former Smokers: Years since quit: Not applicable  Quit Date: Not applicable  Visit Components:  Discussion included one or more decision making aids. yes  Discussion included risk/benefits of screening. yes  Discussion included potential follow up diagnostic testing for abnormal scans. yes  Discussion included meaning and risk of over diagnosis. yes  Discussion included meaning and risk of False Positives. yes  Discussion included meaning of total radiation exposure. yes  Counseling Included:  Importance of adherence to annual lung cancer LDCT screening. yes  Impact of comorbidities on ability to participate in the program. yes  Ability and willingness to under diagnostic treatment. yes  Smoking Cessation Counseling:  Current Smokers:   Discussed importance of smoking cessation. yes  Information about tobacco cessation classes and interventions provided to patient. yes  Patient provided with "ticket" for LDCT Scan. yes  Symptomatic Patient.  No  Counseling not applicable  Diagnosis Code: Tobacco Use Z72.0  Asymptomatic Patient yes  Counseling (Intermediate counseling: > three minutes counseling) [id number]  Former Smokers:   Discussed the importance of maintaining cigarette abstinence. yes  Diagnosis Code: Personal History of Nicotine Dependence. G62.694  Information about tobacco cessation classes and interventions provided to patient. Yes  Patient provided with "ticket" for LDCT Scan.  yes  Written Order for Lung Cancer Screening with LDCT placed in Epic. Yes (CT Chest Lung Cancer Screening Low Dose W/O CM) [id number] Z12.2-Screening of respiratory organs Z87.891-Personal history of nicotine dependence   I have spent 25 minutes of face to face time with Mr. Mousel discussing the risks and benefits of lung cancer screening. We viewed a power point together that explained in detail the above noted topics. We paused at intervals to allow for questions to be asked and answered to ensure understanding.We discussed that the single most powerful action tha can take to decrease his risk of developing lung cancer is to quit smoking. We discussed whether or not he is ready to commit to setting a quit date.  He states he is ready to quit smoking, but has failed many times and does not know what to do.  He does not want to set a quit date today.  We discussed options for tools to aid in quitting smoking including nicotine replacement therapy, non-nicotine medications, support groups, Quit Smart classes, and behavior modification. We discussed that often times setting smaller, more achievable goals, such as eliminating 1 cigarette a day for a week and then 2 cigarettes a day for a week can be helpful in slowly decreasing the number of cigarettes smoked. This allows for a sense of accomplishment as well as providing a clinical benefit. I gave Mr. Nghiem the " Be Stronger Than Your Excuses" card with contact information for community resources, classes, free nicotine replacement therapy, and access to mobile apps, text messaging, and on-line smoking cessation help. I have also given him my card and contact information in the event he needs to contact me. We discussed the time and location of the scan, and that either Doroteo Glassman RN  or I will call with the results within 24-48 hours of receiving them. I have offered Mr. Chaput a copy of the power point we viewed  as a resource in the event they need  reinforcement of the concepts we discussed today in the office. The patient verbalized understanding of all of  the above and had no further questions upon leaving the office. They have my contact information in the event they have any further questions.  I spent 4 minutes counseling on smoking cessation and the health risks of continued tobacco abuse.  I explained to the patient that there has been a high incidence of coronary artery disease noted on these exams. I explained that this is a non-gated exam therefore degree or severity cannot be determined. This patient is currently receiving statin therapy . I have asked the patient to follow-up with their PCP regarding any incidental finding of coronary artery disease and management with diet or medication as their PCP  feels is clinically indicated. The patient verbalized understanding of the above and had no further questions upon completion of the visit.      Magdalen Spatz, NP 03/03/2017

## 2017-03-14 ENCOUNTER — Telehealth: Payer: Self-pay

## 2017-03-14 ENCOUNTER — Other Ambulatory Visit: Payer: Self-pay | Admitting: Acute Care

## 2017-03-14 ENCOUNTER — Ambulatory Visit (HOSPITAL_BASED_OUTPATIENT_CLINIC_OR_DEPARTMENT_OTHER): Payer: Medicare Other | Admitting: Oncology

## 2017-03-14 ENCOUNTER — Other Ambulatory Visit (HOSPITAL_BASED_OUTPATIENT_CLINIC_OR_DEPARTMENT_OTHER): Payer: Medicare Other

## 2017-03-14 VITALS — BP 145/96 | HR 85 | Temp 98.7°F | Resp 18 | Ht 72.0 in | Wt 155.6 lb

## 2017-03-14 DIAGNOSIS — R197 Diarrhea, unspecified: Secondary | ICD-10-CM

## 2017-03-14 DIAGNOSIS — Z122 Encounter for screening for malignant neoplasm of respiratory organs: Secondary | ICD-10-CM

## 2017-03-14 DIAGNOSIS — C9210 Chronic myeloid leukemia, BCR/ABL-positive, not having achieved remission: Secondary | ICD-10-CM

## 2017-03-14 DIAGNOSIS — C9211 Chronic myeloid leukemia, BCR/ABL-positive, in remission: Secondary | ICD-10-CM

## 2017-03-14 DIAGNOSIS — F17210 Nicotine dependence, cigarettes, uncomplicated: Principal | ICD-10-CM

## 2017-03-14 LAB — CBC WITH DIFFERENTIAL/PLATELET
BASO%: 0.9 % (ref 0.0–2.0)
Basophils Absolute: 0 10*3/uL (ref 0.0–0.1)
EOS%: 8.6 % — AB (ref 0.0–7.0)
Eosinophils Absolute: 0.5 10*3/uL (ref 0.0–0.5)
HCT: 34 % — ABNORMAL LOW (ref 38.4–49.9)
HGB: 10.7 g/dL — ABNORMAL LOW (ref 13.0–17.1)
LYMPH%: 27 % (ref 14.0–49.0)
MCH: 24.8 pg — ABNORMAL LOW (ref 27.2–33.4)
MCHC: 31.3 g/dL — ABNORMAL LOW (ref 32.0–36.0)
MCV: 79 fL — ABNORMAL LOW (ref 79.3–98.0)
MONO#: 0.5 10*3/uL (ref 0.1–0.9)
MONO%: 9 % (ref 0.0–14.0)
NEUT%: 54.5 % (ref 39.0–75.0)
NEUTROS ABS: 2.9 10*3/uL (ref 1.5–6.5)
Platelets: 177 10*3/uL (ref 140–400)
RBC: 4.3 10*6/uL (ref 4.20–5.82)
RDW: 22.3 % — ABNORMAL HIGH (ref 11.0–14.6)
WBC: 5.3 10*3/uL (ref 4.0–10.3)
lymph#: 1.4 10*3/uL (ref 0.9–3.3)

## 2017-03-14 LAB — COMPREHENSIVE METABOLIC PANEL
ALBUMIN: 3.3 g/dL — AB (ref 3.5–5.0)
ALK PHOS: 109 U/L (ref 40–150)
ALT: 40 U/L (ref 0–55)
AST: 35 U/L — AB (ref 5–34)
Anion Gap: 10 mEq/L (ref 3–11)
BILIRUBIN TOTAL: 0.55 mg/dL (ref 0.20–1.20)
BUN: 12.9 mg/dL (ref 7.0–26.0)
CALCIUM: 9.3 mg/dL (ref 8.4–10.4)
CHLORIDE: 104 meq/L (ref 98–109)
CO2: 28 mEq/L (ref 22–29)
CREATININE: 1.6 mg/dL — AB (ref 0.7–1.3)
EGFR: 49 mL/min/{1.73_m2} — ABNORMAL LOW (ref >60)
Glucose: 91 mg/dl (ref 70–140)
Potassium: 3.3 mEq/L — ABNORMAL LOW (ref 3.5–5.1)
Sodium: 142 mEq/L (ref 136–145)
TOTAL PROTEIN: 5.8 g/dL — AB (ref 6.4–8.3)

## 2017-03-14 LAB — MAGNESIUM: Magnesium: 2.1 mg/dl (ref 1.5–2.5)

## 2017-03-14 NOTE — Telephone Encounter (Signed)
Printed avs and calender for upcoming appointment. Per 11/6 los 

## 2017-03-14 NOTE — Progress Notes (Signed)
[facility] OFFICE PROGRESS NOTE   Diagnosis: CML  INTERVAL HISTORY:   [name] returns as scheduled.  He continues [place], though he does not take it every day.  He has a poor appetite.  Stable intermittent diarrhea.  Objective:  Vital signs in last 24 hours:  Blood pressure (!) 145/96, pulse 85, temperature 98.7 F (37.1 C), resp. rate 18, height 6' (1.829 m), weight 155 lb 9.6 oz (70.6 kg), SpO2 99 %.    HEENT: No thrush or ulcers Resp: Inspiratory rhonchi at the right posterior base, no respiratory distress Cardio: Regular rate and rhythm GI: No hepatosplenomegaly, nontender Vascular: No left leg edema   Lab Results:  Lab Results  Component Value Date   WBC 5.3 03/14/2017   HGB 10.7 (L) 03/14/2017   HCT 34.0 (L) 03/14/2017   MCV 79.0 (L) 03/14/2017   PLT 177 03/14/2017   NEUTROABS 2.9 03/14/2017    CMP     Component Value Date/Time   NA 142 03/14/2017 1438   K 3.3 (L) 03/14/2017 1438   CL 106 01/07/2017 1152   CL 105 08/07/2012 1339   CO2 28 03/14/2017 1438   GLUCOSE 91 03/14/2017 1438   GLUCOSE 115 (H) 08/07/2012 1339   BUN 12.9 03/14/2017 1438   CREATININE 1.6 (H) 03/14/2017 1438   CALCIUM 9.3 03/14/2017 1438   PROT 5.8 (L) 03/14/2017 1438   ALBUMIN 3.3 (L) 03/14/2017 1438   AST 35 (H) 03/14/2017 1438   ALT 40 03/14/2017 1438   ALKPHOS 109 03/14/2017 1438   BILITOT 0.55 03/14/2017 1438   GFRNONAA 54 (L) 01/07/2017 1152   GFRAA >60 01/07/2017 1152    Peripheral blood PCR on 02/14/2017- 1.74% Medications: I have reviewed the patient's current medications.  Assessment/Plan: 1. Chronic myelogenous leukemia, diagnosed in January of 2009. He remains in hematologic remission. He is taking Gleevec at a dose of 200 mg daily. The peripheral blood PCR was markedly increased in May 2013, likely reflecting medical noncompliance. The peripheral blood PCR has improved over the past few months. 2. Status post left knee replacement  05/14/2010. 3. Status post C3-C4, C4-C5, anterior cervical diskectomy and fusion with allograft and plating 09/30/2010. 4. 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 5. Depression 6. Diarrhea, ? related to chronic pancreatitis versus Gleevec. He takes Lomotil as needed.  7. Indurated facial skin lesion 11/20/2007 with a history of MRSA skin infection of the submental area in May 2008. The induration resolved with doxycycline. 8. Left knee arthroscopy 05/25/2007. 9. Postoperative left knee effusion/pain, likely related to gout. 10. History of gout.  11. Chronic pancreatitis. 12. Status post right above-the-knee amputation. 13. MRSA infection of the submental area May 2008. 14. History of tobacco, alcohol, and cocaine use. 15. History of coronary artery disease. 16. "Shotty" lymphadenopathy of the neck, axilla, and left groin in 2009.  17. History of a microcytic anemia.  18. History of a right olecranon bursa lesion, ? gouty tophus. 19. 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 He previously took AndroGel. 20. History of anemia secondary to chronic disease and Gleevec therapy.  21. history of anorexia-potentially related to [place]. He reports Medicaid would not pay for Megace.  22. Chronic left knee and left foot pain.  23. Status post removal of a left knee screw 07/03/2012. 24. History of hypokalemia. Likely related to diarrhea.He is on a potassium supplement. 25. Status post left foot surgery. 26. Pulsatile fullness right neck. Evaluated  by vascular surgery status post CT angiogram 03/07/2014 with no carotid artery aneurysm identified. 27. Cough-abnormal lung exam 05/12/2015. He completed a course of Levaquin. Chest xray negative. 28. Hospitalization 09/20/2016 through 10/07/2016 with unresponsiveness/acute metabolic encephalopathy   Disposition:  [name] appears stable.  The peripheral blood PCR was lower last  month, likely indicating continued responsiveness to [place] in the setting of intermittent compliance.  I stressed the importance of taking the Gleevec daily.  We will follow-up on the PCR from today.  He will return for an office and lab visit in 2 months.  The anemia is likely secondary to CML, Gleevec, chronic renal failure, and chronic disease.  We will switch to a different tyrosine kinase inhibitor if there is a consistent rise in the peripheral blood PCR.  He will continue follow-up with Dr. Jeanie Cooks for management of hypertension and renal insufficiency.  15 minutes were spent with the patient today.  The majority of the time was used for counseling and coordination of care.  Donneta Romberg, MD  03/14/2017  4:26 PM

## 2017-03-15 LAB — IRON AND TIBC
%SAT: 14 % — AB (ref 20–55)
IRON: 43 ug/dL (ref 42–163)
TIBC: 310 ug/dL (ref 202–409)
UIBC: 267 ug/dL (ref 117–376)

## 2017-03-15 LAB — FERRITIN: Ferritin: 31 ng/ml (ref 22–316)

## 2017-03-18 ENCOUNTER — Other Ambulatory Visit: Payer: Self-pay | Admitting: Nurse Practitioner

## 2017-03-18 DIAGNOSIS — C9210 Chronic myeloid leukemia, BCR/ABL-positive, not having achieved remission: Secondary | ICD-10-CM

## 2017-03-27 ENCOUNTER — Ambulatory Visit (INDEPENDENT_AMBULATORY_CARE_PROVIDER_SITE_OTHER): Payer: Medicare Other

## 2017-03-27 ENCOUNTER — Ambulatory Visit (INDEPENDENT_AMBULATORY_CARE_PROVIDER_SITE_OTHER): Payer: Medicare Other | Admitting: Orthopaedic Surgery

## 2017-03-27 ENCOUNTER — Encounter (INDEPENDENT_AMBULATORY_CARE_PROVIDER_SITE_OTHER): Payer: Self-pay | Admitting: Orthopaedic Surgery

## 2017-03-27 DIAGNOSIS — G8929 Other chronic pain: Secondary | ICD-10-CM

## 2017-03-27 DIAGNOSIS — M25562 Pain in left knee: Secondary | ICD-10-CM
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
## 2017-04-07 ENCOUNTER — Other Ambulatory Visit: Payer: Self-pay | Admitting: Nurse Practitioner

## 2017-04-07 DIAGNOSIS — C9210 Chronic myeloid leukemia, BCR/ABL-positive, not having achieved remission: Secondary | ICD-10-CM

## 2017-04-13 ENCOUNTER — Ambulatory Visit
Admission: RE | Admit: 2017-04-13 | Discharge: 2017-04-13 | Disposition: A | Discharge status: Home / Self Care | Payer: Medicare Other | Source: Ambulatory Visit | Attending: Orthopaedic Surgery | Admitting: Orthopaedic Surgery

## 2017-04-13 DIAGNOSIS — M25562 Pain in left knee: Principal | ICD-10-CM

## 2017-04-13 DIAGNOSIS — G8929 Other chronic pain: Secondary | ICD-10-CM

## 2017-05-11 ENCOUNTER — Inpatient Hospital Stay: Payer: Medicare Other | Attending: Nurse Practitioner | Admitting: Nurse Practitioner

## 2017-05-11 ENCOUNTER — Other Ambulatory Visit (HOSPITAL_BASED_OUTPATIENT_CLINIC_OR_DEPARTMENT_OTHER): Payer: Medicare Other

## 2017-05-11 ENCOUNTER — Encounter: Payer: Self-pay | Admitting: Nurse Practitioner

## 2017-05-11 VITALS — BP 139/78 | HR 83 | Temp 98.5°F | Resp 20 | Ht 68.0 in | Wt 154.8 lb

## 2017-05-11 DIAGNOSIS — D6489 Other specified anemias: Secondary | ICD-10-CM

## 2017-05-11 DIAGNOSIS — C9210 Chronic myeloid leukemia, BCR/ABL-positive, not having achieved remission: Secondary | ICD-10-CM

## 2017-05-11 DIAGNOSIS — C9211 Chronic myeloid leukemia, BCR/ABL-positive, in remission: Secondary | ICD-10-CM

## 2017-05-11 DIAGNOSIS — R110 Nausea: Secondary | ICD-10-CM

## 2017-05-11 DIAGNOSIS — R197 Diarrhea, unspecified: Secondary | ICD-10-CM

## 2017-05-11 LAB — COMPREHENSIVE METABOLIC PANEL
ALBUMIN: 3.6 g/dL (ref 3.5–5.0)
ALK PHOS: 114 U/L (ref 40–150)
ALT: 25 U/L (ref 0–55)
AST: 28 U/L (ref 5–34)
Anion Gap: 11 mEq/L (ref 3–11)
BILIRUBIN TOTAL: 0.26 mg/dL (ref 0.20–1.20)
BUN: 16 mg/dL (ref 7.0–26.0)
CO2: 27 meq/L (ref 22–29)
Calcium: 9.2 mg/dL (ref 8.4–10.4)
Chloride: 105 mEq/L (ref 98–109)
Creatinine: 1.7 mg/dL — ABNORMAL HIGH (ref 0.7–1.3)
EGFR: 45 mL/min/{1.73_m2} — ABNORMAL LOW (ref >60)
GLUCOSE: 97 mg/dL (ref 70–140)
Potassium: 3.4 mEq/L — ABNORMAL LOW (ref 3.5–5.1)
SODIUM: 143 meq/L (ref 136–145)
Total Protein: 5.9 g/dL — ABNORMAL LOW (ref 6.4–8.3)

## 2017-05-11 LAB — CBC WITH DIFFERENTIAL/PLATELET
BASO%: 0.3 % (ref 0.0–2.0)
Basophils Absolute: 0 10*3/uL (ref 0.0–0.1)
EOS%: 7.8 % — AB (ref 0.0–7.0)
Eosinophils Absolute: 0.5 10*3/uL (ref 0.0–0.5)
HEMATOCRIT: 32.5 % — AB (ref 38.4–49.9)
HGB: 10.4 g/dL — ABNORMAL LOW (ref 13.0–17.1)
LYMPH%: 30.9 % (ref 14.0–49.0)
MCH: 25.3 pg — AB (ref 27.2–33.4)
MCHC: 32 g/dL (ref 32.0–36.0)
MCV: 79.1 fL — AB (ref 79.3–98.0)
MONO#: 0.6 10*3/uL (ref 0.1–0.9)
MONO%: 9.1 % (ref 0.0–14.0)
NEUT#: 3.3 10*3/uL (ref 1.5–6.5)
NEUT%: 51.9 % (ref 39.0–75.0)
Platelets: 141 10*3/uL (ref 140–400)
RBC: 4.11 10*6/uL — ABNORMAL LOW (ref 4.20–5.82)
RDW: 19 % — ABNORMAL HIGH (ref 11.0–14.6)
WBC: 6.4 10*3/uL (ref 4.0–10.3)
lymph#: 2 10*3/uL (ref 0.9–3.3)
nRBC: 0 % (ref 0–0)

## 2017-05-11 LAB — MAGNESIUM: Magnesium: 1.9 mg/dl (ref 1.5–2.5)

## 2017-05-11 MED ORDER — ONDANSETRON 4 MG PO TBDP: 4.0000 mg | ORAL_TABLET | Freq: Three times a day (TID) | ORAL | 0 refills | Status: DC | PRN

## 2017-05-11 MED ORDER — DIPHENOXYLATE-ATROPINE 2.5-0.025 MG PO TABS: ORAL_TABLET | ORAL | 1 refills | Status: DC

## 2017-05-11 NOTE — Progress Notes (Signed)
[facility] OFFICE PROGRESS NOTE   Diagnosis: CML  INTERVAL HISTORY:   [name] returns as scheduled.  He continues to have intermittent nausea and diarrhea.  Sometimes he does not take Gleevec due to the symptoms.  The diarrhea overall is controlled with Imodium.  He takes Zofran as needed for the nausea.  He denies rectal bleeding.  He intermittently notes dark stools which he relates to Pepto-Bismol.  Objective:  Vital signs in last 24 hours:  Blood pressure 139/78, pulse 83, temperature 98.5 F (36.9 C), temperature source Oral, resp. rate 20, height 5\' 8"  (1.727 m), weight 154 lb 12.8 oz (70.2 kg), SpO2 98 %.    HEENT: No thrush or ulcers. Resp: Lungs clear bilaterally. Cardio: Regular rate and rhythm. GI: Abdomen soft and nontender.  No hepatosplenomegaly. Vascular: No left leg edema.  Lab Results:  Lab Results  Component Value Date   WBC 6.4 05/11/2017   HGB 10.4 (L) 05/11/2017   HCT 32.5 (L) 05/11/2017   MCV 79.1 (L) 05/11/2017   PLT 141 05/11/2017   NEUTROABS 3.3 05/11/2017    Imaging:  No results found.  Medications: I have reviewed the patient's current medications.  Assessment/Plan: 1. Chronic myelogenous leukemia, diagnosed in January of 2009. He remains in hematologic remission. He is taking Gleevec at a dose of 200 mg daily. The peripheral blood PCR was markedly increased in May 2013, likely reflecting medical noncompliance. The peripheral blood PCR has improved over the past few months. 2. 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 3. Status post C3-C4, C4-C5, anterior cervical diskectomy and fusion with allograft and plating 09/30/2010. 4. 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 5. Depression 6. Diarrhea, ? related to chronic pancreatitis versus Gleevec. He takes Lomotil as needed.  7. Indurated facial skin lesion 11/20/2007 with a history of MRSA skin  infection of the submental area in May 2008. The induration resolved with doxycycline. 8. Left knee arthroscopy 05/25/2007. 9. Postoperative left knee effusion/pain, likely related to gout. 10. History of gout.  11. Chronic pancreatitis. 12. Status post right above-the-knee amputation. 13. MRSA infection of the submental area May 2008. 14. History of tobacco, alcohol, and cocaine use. 15. History of coronary artery disease. 16. "Shotty" lymphadenopathy of the neck, axilla, and left groin in 2009.  17. History of a microcytic anemia.  18. History of a right olecranon bursa lesion, ? gouty tophus. 19. 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 He previously took AndroGel. 20. History of anemia secondary to chronic disease and Gleevec therapy.  21. history of anorexia-potentially related to [place]. He reports Medicaid would not pay for Megace.  22. Chronic left knee and left foot pain.  23. Status post removal of a left knee screw 07/03/2012. 24. History of hypokalemia. Likely related to diarrhea.He is on a potassium supplement. 25. Status post left foot surgery. 26. Pulsatile fullness right neck. Evaluated by vascular surgery status post CT angiogram 03/07/2014 with no carotid artery aneurysm identified. 27. Cough-abnormal lung exam 05/12/2015. He completed a course of Levaquin. Chest xray negative. 28. Hospitalization 09/20/2016 through 10/07/2016 with unresponsiveness/acute metabolic encephalopathy      Disposition: [name] appears unchanged.  He will continue Gleevec.  He will try to take the [place] on a daily basis consistently.  We will follow-up on the referral blood PCR from today.  We reviewed today's labs.  He has a microcytic anemia.  He will complete a set of stool cards.  He thinks he has had a colonoscopy in the past but is unsure when.  He will return for labs and a follow-up visit in 6 weeks.  He will contact the office in the interim with any problems.  Plan  reviewed with Dr. [name].  [name] ANP/GNP-BC   05/11/2017  3:57 PM

## 2017-05-16 ENCOUNTER — Other Ambulatory Visit: Payer: Self-pay | Admitting: Emergency Medicine

## 2017-05-16 DIAGNOSIS — C9210 Chronic myeloid leukemia, BCR/ABL-positive, not having achieved remission: Secondary | ICD-10-CM

## 2017-05-16 MED ORDER — POTASSIUM CHLORIDE CRYS ER 20 MEQ PO TBCR: 20.0000 meq | EXTENDED_RELEASE_TABLET | Freq: Every day | ORAL | 1 refills | Status: DC

## 2017-05-17 ENCOUNTER — Telehealth: Payer: Self-pay | Admitting: Oncology

## 2017-05-17 ENCOUNTER — Telehealth: Payer: Self-pay

## 2017-05-17 NOTE — Telephone Encounter (Signed)
Called regarding 2/12

## 2017-05-17 NOTE — Telephone Encounter (Signed)
Patient called requesting new stool kit because "I lost mine". Returned patient call and let patient know that new kit will be up front with Ms. Wilma, per patient request. Patient voiced understanding.

## 2017-05-18 ENCOUNTER — Other Ambulatory Visit: Payer: Self-pay

## 2017-05-18 ENCOUNTER — Telehealth: Payer: Self-pay

## 2017-05-18 DIAGNOSIS — C9210 Chronic myeloid leukemia, BCR/ABL-positive, not having achieved remission: Secondary | ICD-10-CM

## 2017-05-18 MED ORDER — DIPHENOXYLATE-ATROPINE 2.5-0.025 MG PO TABS: ORAL_TABLET | ORAL | 1 refills | Status: DC

## 2017-05-18 NOTE — Progress Notes (Signed)
Spoke with pharmacist at [facility] to refill Lomotil.

## 2017-05-18 NOTE — Telephone Encounter (Signed)
Err

## 2017-06-16 ENCOUNTER — Other Ambulatory Visit: Payer: Self-pay | Admitting: *Deleted

## 2017-06-16 DIAGNOSIS — C9210 Chronic myeloid leukemia, BCR/ABL-positive, not having achieved remission: Secondary | ICD-10-CM

## 2017-06-16 MED ORDER — DIPHENOXYLATE-ATROPINE 2.5-0.025 MG PO TABS: ORAL_TABLET | ORAL | 0 refills | Status: DC

## 2017-06-19 ENCOUNTER — Other Ambulatory Visit: Payer: Self-pay | Admitting: Nurse Practitioner

## 2017-06-20 ENCOUNTER — Inpatient Hospital Stay: Payer: Medicare Other

## 2017-06-20 ENCOUNTER — Inpatient Hospital Stay: Payer: Medicare Other | Attending: Nurse Practitioner | Admitting: Oncology

## 2017-06-20 ENCOUNTER — Encounter: Payer: Self-pay | Admitting: Oncology

## 2017-06-20 VITALS — BP 172/93 | HR 71 | Temp 98.0°F | Resp 18 | Ht 68.0 in | Wt 155.0 lb

## 2017-06-20 DIAGNOSIS — K861 Other chronic pancreatitis: Secondary | ICD-10-CM | POA: Insufficient documentation

## 2017-06-20 DIAGNOSIS — Z8614 Personal history of Methicillin resistant Staphylococcus aureus infection: Secondary | ICD-10-CM | POA: Insufficient documentation

## 2017-06-20 DIAGNOSIS — I2510 Atherosclerotic heart disease of native coronary artery without angina pectoris: Secondary | ICD-10-CM | POA: Diagnosis not present

## 2017-06-20 DIAGNOSIS — Z87891 Personal history of nicotine dependence: Secondary | ICD-10-CM | POA: Diagnosis not present

## 2017-06-20 DIAGNOSIS — C9210 Chronic myeloid leukemia, BCR/ABL-positive, not having achieved remission: Secondary | ICD-10-CM | POA: Insufficient documentation

## 2017-06-20 DIAGNOSIS — D509 Iron deficiency anemia, unspecified: Secondary | ICD-10-CM | POA: Insufficient documentation

## 2017-06-20 DIAGNOSIS — Z79899 Other long term (current) drug therapy: Secondary | ICD-10-CM | POA: Diagnosis not present

## 2017-06-20 DIAGNOSIS — R197 Diarrhea, unspecified: Secondary | ICD-10-CM | POA: Diagnosis not present

## 2017-06-20 DIAGNOSIS — D638 Anemia in other chronic diseases classified elsewhere: Secondary | ICD-10-CM | POA: Diagnosis not present

## 2017-06-20 LAB — CBC WITH DIFFERENTIAL/PLATELET
BASOS PCT: 1 %
Basophils Absolute: 0 10*3/uL (ref 0.0–0.1)
Eosinophils Absolute: 0.4 10*3/uL (ref 0.0–0.5)
Eosinophils Relative: 8 %
HEMATOCRIT: 35.7 % — AB (ref 38.4–49.9)
Hemoglobin: 11.3 g/dL — ABNORMAL LOW (ref 13.0–17.1)
LYMPHS ABS: 1.3 10*3/uL (ref 0.9–3.3)
Lymphocytes Relative: 28 %
MCH: 25.6 pg — AB (ref 27.2–33.4)
MCHC: 31.6 g/dL — AB (ref 32.0–36.0)
MCV: 80.9 fL (ref 79.3–98.0)
MONO ABS: 0.5 10*3/uL (ref 0.1–0.9)
MONOS PCT: 10 %
Neutro Abs: 2.5 10*3/uL (ref 1.5–6.5)
Neutrophils Relative %: 53 %
Platelets: 145 10*3/uL (ref 140–400)
RBC: 4.41 MIL/uL (ref 4.20–5.82)
RDW: 20.2 % — AB (ref 11.0–14.6)
WBC: 4.8 10*3/uL (ref 4.0–10.3)

## 2017-06-20 LAB — COMPREHENSIVE METABOLIC PANEL
ALBUMIN: 3.4 g/dL — AB (ref 3.5–5.0)
ALK PHOS: 126 U/L (ref 40–150)
ALT: 60 U/L — ABNORMAL HIGH (ref 0–55)
ANION GAP: 11 (ref 3–11)
AST: 72 U/L — ABNORMAL HIGH (ref 5–34)
BILIRUBIN TOTAL: 0.6 mg/dL (ref 0.2–1.2)
BUN: 15 mg/dL (ref 7–26)
CALCIUM: 9.1 mg/dL (ref 8.4–10.4)
CO2: 29 mmol/L (ref 22–29)
Chloride: 105 mmol/L (ref 98–109)
Creatinine, Ser: 1.73 mg/dL — ABNORMAL HIGH (ref 0.70–1.30)
GFR, EST AFRICAN AMERICAN: 44 mL/min — AB (ref >60)
GFR, EST NON AFRICAN AMERICAN: 38 mL/min — AB (ref >60)
GLUCOSE: 92 mg/dL (ref 70–140)
Potassium: 3.3 mmol/L — ABNORMAL LOW (ref 3.5–5.1)
Sodium: 145 mmol/L (ref 136–145)
TOTAL PROTEIN: 5.8 g/dL — AB (ref 6.4–8.3)

## 2017-06-20 LAB — MAGNESIUM: MAGNESIUM: 1.8 mg/dL (ref 1.5–2.5)

## 2017-06-20 MED ORDER — POTASSIUM CHLORIDE CRYS ER 20 MEQ PO TBCR: 20.0000 meq | EXTENDED_RELEASE_TABLET | Freq: Every day | ORAL | 1 refills | Status: DC

## 2017-06-20 MED ORDER — ONDANSETRON 4 MG PO TBDP: 4.0000 mg | ORAL_TABLET | Freq: Three times a day (TID) | ORAL | 0 refills | Status: DC | PRN

## 2017-06-20 NOTE — Progress Notes (Signed)
[place] OFFICE PROGRESS NOTE   Diagnosis: CML  INTERVAL HISTORY:   [name] returns as scheduled.  He continues [name].  He reports missing Gleevec approximately 3-4 times over the past month.  He has 2-3 bowel movements per day.  He uses Lomotil for diarrhea.  He had an episode of increased diarrhea and nausea/vomiting yesterday.  Objective:  Vital signs in last 24 hours:  Blood pressure (!) 172/93, pulse 71, temperature 98 F (36.7 C), resp. rate 18, height 5\' 8"  (1.727 m), weight 155 lb (70.3 kg), SpO2 98 %.    HEENT: Thrush or ulcers Resp: Inspiratory rhonchi at the posterior base bilaterally, no respiratory distress Cardio: Regular rate and rhythm GI: Tender in the mid lower abdomen, no mass, no hepatosplenomegaly Vascular: No leg edema    Lab Results:  Lab Results  Component Value Date   WBC 4.8 06/20/2017   HGB 11.3 (L) 06/20/2017   HCT 35.7 (L) 06/20/2017   MCV 80.9 06/20/2017   PLT 145 06/20/2017   NEUTROABS 2.5 06/20/2017    CMP     Component Value Date/Time   NA 145 06/20/2017 1433   NA 143 05/11/2017 1459   K 3.3 (L) 06/20/2017 1433   K 3.4 (L) 05/11/2017 1459   CL 105 06/20/2017 1433   CL 105 08/07/2012 1339   CO2 29 06/20/2017 1433   CO2 27 05/11/2017 1459   GLUCOSE 92 06/20/2017 1433   GLUCOSE 97 05/11/2017 1459   GLUCOSE 115 (H) 08/07/2012 1339   BUN 15 06/20/2017 1433   BUN 16.0 05/11/2017 1459   CREATININE 1.73 (H) 06/20/2017 1433   CREATININE 1.7 (H) 05/11/2017 1459   CALCIUM 9.1 06/20/2017 1433   CALCIUM 9.2 05/11/2017 1459   PROT 5.8 (L) 06/20/2017 1433   PROT 5.9 (L) 05/11/2017 1459   ALBUMIN 3.4 (L) 06/20/2017 1433   ALBUMIN 3.6 05/11/2017 1459   AST 72 (H) 06/20/2017 1433   AST 28 05/11/2017 1459   ALT 60 (H) 06/20/2017 1433   ALT 25 05/11/2017 1459   ALKPHOS 126 06/20/2017 1433   ALKPHOS 114 05/11/2017 1459   BILITOT 0.6 06/20/2017 1433   BILITOT 0.26 05/11/2017 1459   GFRNONAA 38 (L) 06/20/2017 1433    GFRAA 44 (L) 06/20/2017 1433      Medications: I have reviewed the patient's current medications.   Assessment/Plan:  1. Chronic myelogenous leukemia, diagnosed in January of 2009. He remains in hematologic remission. He is taking Gleevec at a dose of 200 mg daily. The peripheral blood PCR was markedly increased in May 2013, likely reflecting medical noncompliance. The peripheral blood PCRwas stable on 05/11/2017  2. 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 3. Status post C3-C4, C4-C5, anterior cervical diskectomy and fusion with allograft and plating 09/30/2010. 4. 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 5. Depression 6. Diarrhea, ? related to chronic pancreatitis versus Gleevec. He takes Lomotil as needed.  7. Indurated facial skin lesion 11/20/2007 with a history of MRSA skin infection of the submental area in May 2008. The induration resolved with doxycycline. 8. Left knee arthroscopy 05/25/2007. 9. Postoperative left knee effusion/pain, likely related to gout. 10. History of gout.  11. Chronic pancreatitis. 12. Status post right above-the-knee amputation. 13. MRSA infection of the submental area May 2008. 14. History of tobacco, alcohol, and cocaine use. 15. History of coronary artery disease. 16. "Shotty" lymphadenopathy of the neck, axilla, and left groin in  2009.  17. History of a microcytic anemia.  18. History of a right olecranon bursa lesion, ? gouty tophus. 19. 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 He previously took AndroGel. 20. History of anemia secondary to chronic disease and Gleevec therapy.  21. history of anorexia-potentially related to [name]. He reports Medicaid would not pay for Megace.  22. Chronic left knee and left foot pain.  23. Status post removal of a left knee screw 07/03/2012. 24. History of hypokalemia. Likely related to diarrhea.He is on a potassium  supplement. 25. Status post left foot surgery. 26. Pulsatile fullness right neck. Evaluated by vascular surgery status post CT angiogram 03/07/2014 with no carotid artery aneurysm identified. 27. Cough-abnormal lung exam 05/12/2015. He completed a course of Levaquin. Chest xray negative. 28. Hospitalization 09/20/2016 through 10/07/2016 with unresponsiveness/acute metabolic encephalopathy  Disposition: [name] is in hematologic remission from CML, but the peripheral blood PCR remains detectable.  The plan is to obtain mutation testing and switch to a different tyrosine kinase inhibitor if there is evidence of loss of hematologic remission or a progressive rise in the peripheral blood PCR.  He has multiple comorbid conditions and takes many medications.  He is not taking all the medications as prescribed.  We refilled prescriptions for potassium and ondansetron today.  He would benefit from simplification of his medical regimen.  The hemoglobin is higher compared to when he was here last month.  We will repeat serum iron studies and recommend stool Hemoccult cards when he returns next month.  15 minutes were spent with the patient today.  The majority of the time was used for counseling and coordination of care.  [name], MD  06/20/2017  3:51 PM

## 2017-06-21 ENCOUNTER — Telehealth: Payer: Self-pay | Admitting: Oncology

## 2017-06-21 NOTE — Telephone Encounter (Signed)
Scheduled appt per 2/12 los - patient is aware of appt date and time.

## 2017-06-28 LAB — BCR/ABL

## 2017-07-20 ENCOUNTER — Inpatient Hospital Stay: Payer: Medicare Other | Attending: Nurse Practitioner | Admitting: Nurse Practitioner

## 2017-07-20 ENCOUNTER — Encounter: Payer: Self-pay | Admitting: Nurse Practitioner

## 2017-07-20 ENCOUNTER — Inpatient Hospital Stay: Payer: Medicare Other

## 2017-07-20 VITALS — BP 151/88 | HR 99 | Temp 98.7°F | Resp 17 | Ht 68.0 in | Wt 147.8 lb

## 2017-07-20 DIAGNOSIS — M79672 Pain in left foot: Secondary | ICD-10-CM | POA: Insufficient documentation

## 2017-07-20 DIAGNOSIS — I2510 Atherosclerotic heart disease of native coronary artery without angina pectoris: Secondary | ICD-10-CM | POA: Insufficient documentation

## 2017-07-20 DIAGNOSIS — R630 Anorexia: Secondary | ICD-10-CM | POA: Insufficient documentation

## 2017-07-20 DIAGNOSIS — K521 Toxic gastroenteritis and colitis: Secondary | ICD-10-CM | POA: Diagnosis not present

## 2017-07-20 DIAGNOSIS — D638 Anemia in other chronic diseases classified elsewhere: Secondary | ICD-10-CM | POA: Diagnosis not present

## 2017-07-20 DIAGNOSIS — D509 Iron deficiency anemia, unspecified: Secondary | ICD-10-CM | POA: Diagnosis not present

## 2017-07-20 DIAGNOSIS — Z87891 Personal history of nicotine dependence: Secondary | ICD-10-CM | POA: Diagnosis not present

## 2017-07-20 DIAGNOSIS — C9210 Chronic myeloid leukemia, BCR/ABL-positive, not having achieved remission: Secondary | ICD-10-CM

## 2017-07-20 DIAGNOSIS — M109 Gout, unspecified: Secondary | ICD-10-CM | POA: Diagnosis not present

## 2017-07-20 DIAGNOSIS — K861 Other chronic pancreatitis: Secondary | ICD-10-CM | POA: Diagnosis not present

## 2017-07-20 DIAGNOSIS — Z96652 Presence of left artificial knee joint: Secondary | ICD-10-CM | POA: Insufficient documentation

## 2017-07-20 DIAGNOSIS — R590 Localized enlarged lymph nodes: Secondary | ICD-10-CM | POA: Insufficient documentation

## 2017-07-20 DIAGNOSIS — E876 Hypokalemia: Secondary | ICD-10-CM | POA: Diagnosis not present

## 2017-07-20 DIAGNOSIS — Z89611 Acquired absence of right leg above knee: Secondary | ICD-10-CM | POA: Insufficient documentation

## 2017-07-20 DIAGNOSIS — R112 Nausea with vomiting, unspecified: Secondary | ICD-10-CM

## 2017-07-20 DIAGNOSIS — F329 Major depressive disorder, single episode, unspecified: Secondary | ICD-10-CM | POA: Diagnosis not present

## 2017-07-20 DIAGNOSIS — R197 Diarrhea, unspecified: Secondary | ICD-10-CM | POA: Insufficient documentation

## 2017-07-20 DIAGNOSIS — M25562 Pain in left knee: Secondary | ICD-10-CM | POA: Diagnosis not present

## 2017-07-20 DIAGNOSIS — C9211 Chronic myeloid leukemia, BCR/ABL-positive, in remission: Secondary | ICD-10-CM | POA: Insufficient documentation

## 2017-07-20 LAB — CMP (CANCER CENTER ONLY)
ALBUMIN: 3.2 g/dL — AB (ref 3.5–5.0)
ALT: 68 U/L — ABNORMAL HIGH (ref 0–55)
ANION GAP: 10 (ref 3–11)
AST: 62 U/L — ABNORMAL HIGH (ref 5–34)
Alkaline Phosphatase: 142 U/L (ref 40–150)
BILIRUBIN TOTAL: 0.3 mg/dL (ref 0.2–1.2)
BUN: 18 mg/dL (ref 7–26)
CO2: 27 mmol/L (ref 22–29)
Calcium: 9 mg/dL (ref 8.4–10.4)
Chloride: 106 mmol/L (ref 98–109)
Creatinine: 2.06 mg/dL — ABNORMAL HIGH (ref 0.70–1.30)
GFR, Est AFR Am: 36 mL/min — ABNORMAL LOW (ref >60)
GFR, Estimated: 31 mL/min — ABNORMAL LOW (ref >60)
GLUCOSE: 108 mg/dL (ref 70–140)
POTASSIUM: 3.3 mmol/L — AB (ref 3.5–5.1)
Sodium: 143 mmol/L (ref 136–145)
TOTAL PROTEIN: 5.7 g/dL — AB (ref 6.4–8.3)

## 2017-07-20 LAB — CBC WITH DIFFERENTIAL (CANCER CENTER ONLY)
BASOS ABS: 0 10*3/uL (ref 0.0–0.1)
Basophils Relative: 1 %
Eosinophils Absolute: 0.3 10*3/uL (ref 0.0–0.5)
Eosinophils Relative: 9 %
HEMATOCRIT: 34.7 % — AB (ref 38.4–49.9)
HEMOGLOBIN: 11 g/dL — AB (ref 13.0–17.1)
LYMPHS PCT: 29 %
Lymphs Abs: 1 10*3/uL (ref 0.9–3.3)
MCH: 25.9 pg — ABNORMAL LOW (ref 27.2–33.4)
MCHC: 31.8 g/dL — ABNORMAL LOW (ref 32.0–36.0)
MCV: 81.5 fL (ref 79.3–98.0)
MONO ABS: 0.6 10*3/uL (ref 0.1–0.9)
Monocytes Relative: 17 %
NEUTROS ABS: 1.5 10*3/uL (ref 1.5–6.5)
Neutrophils Relative %: 44 %
Platelet Count: 146 10*3/uL (ref 140–400)
RBC: 4.26 MIL/uL (ref 4.20–5.82)
RDW: 19.9 % — ABNORMAL HIGH (ref 11.0–14.6)
WBC Count: 3.4 10*3/uL — ABNORMAL LOW (ref 4.0–10.3)

## 2017-07-20 LAB — MAGNESIUM: Magnesium: 1.6 mg/dL (ref 1.5–2.5)

## 2017-07-20 MED ORDER — ONDANSETRON 4 MG PO TBDP: 4.0000 mg | ORAL_TABLET | Freq: Three times a day (TID) | ORAL | 0 refills | Status: DC | PRN

## 2017-07-20 NOTE — Progress Notes (Addendum)
[facility] OFFICE PROGRESS NOTE   Diagnosis: CML  INTERVAL HISTORY:   [name] returns as scheduled.  He continues [place].  He reports nausea, vomiting and diarrhea after taking Gleevec.  He wonders if he should switch to a different medication for the CML.  His appetite is poor due to the nausea.  Take Zofran as needed with good relief.  He has lost some weight.  No rash.  He denies significant pain.  Objective:  Vital signs in last 24 hours:  Blood pressure (!) 151/88, pulse 99, temperature 98.7 F (37.1 C), temperature source Oral, resp. rate 17, height 5\' 8"  (1.727 m), weight 147 lb 12.8 oz (67 kg), SpO2 98 %.    HEENT: No thrush or ulcers. Resp: Lungs clear bilaterally. Cardio: Regular rate and rhythm. GI: Abdomen soft and nontender.  No hepatomegaly. Vascular: No left leg edema. Neuro: Alert and oriented. Skin: No rash.   Lab Results:  Lab Results  Component Value Date   WBC 3.4 (L) 07/20/2017   HGB 11.3 (L) 06/20/2017   HCT 34.7 (L) 07/20/2017   MCV 81.5 07/20/2017   PLT 146 07/20/2017   NEUTROABS 1.5 07/20/2017   06/20/2017 BCR/ABL 0.4158% Imaging:  No results found.  Medications: I have reviewed the patient's current medications.  Assessment/Plan: 1. Chronic myelogenous leukemia, diagnosed in January of 2009. He remains in hematologic remission. He is taking Gleevec at a dose of 200 mg daily. The peripheral blood PCR was markedly increased in May 2013, likely reflecting medical noncompliance. The peripheral blood PCRwas stable on 05/11/2017  2. 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 3. Status post C3-C4, C4-C5, anterior cervical diskectomy and fusion with allograft and plating 09/30/2010. 4. 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 5. Depression 6. Diarrhea, ? related to chronic pancreatitis versus Gleevec. He takes Lomotil as needed.  7. Indurated facial skin  lesion 11/20/2007 with a history of MRSA skin infection of the submental area in May 2008. The induration resolved with doxycycline. 8. Left knee arthroscopy 05/25/2007. 9. Postoperative left knee effusion/pain, likely related to gout. 10. History of gout.  11. Chronic pancreatitis. 12. Status post right above-the-knee amputation. 13. MRSA infection of the submental area May 2008. 14. History of tobacco, alcohol, and cocaine use. 15. History of coronary artery disease. 16. "Shotty" lymphadenopathy of the neck, axilla, and left groin in 2009.  17. History of a microcytic anemia.  18. History of a right olecranon bursa lesion, ? gouty tophus. 19. 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 He previously took AndroGel. 20. History of anemia secondary to chronic disease and Gleevec therapy.  21. history of anorexia-potentially related to [place]. He reports Medicaid would not pay for Megace.  22. Chronic left knee and left foot pain.  23. Status post removal of a left knee screw 07/03/2012. 24. History of hypokalemia. Likely related to diarrhea.He is on a potassium supplement. 25. Status post left foot surgery. 26. Pulsatile fullness right neck. Evaluated by vascular surgery status post CT angiogram 03/07/2014 with no carotid artery aneurysm identified. 27. Cough-abnormal lung exam 05/12/2015. He completed a course of Levaquin. Chest xray negative. 28. Hospitalization 09/20/2016 through 10/07/2016 with unresponsiveness/acute metabolic encephalopathy     Disposition: [name] appears unchanged.  At present he is taking Gleevec but is not this tolerating well.  He is having nausea/vomiting and diarrhea.  Dr. [name] recommends changing his treatment to Nilotinib or dasatinib.  We reviewed potential toxicities  including hematologic toxicity, nausea, diarrhea, rash, edema, pleural effusion, arrhythmia.  We will discuss whether to change the treatment to Nilotinib or dasatinib with the [facility]  oral pharmacist.  For now he will continue [place].  He will return for a follow-up visit in 3 weeks.  Patient seen with Dr. [name].  [name] ANP/GNP-BC   07/20/2017  3:20 PM This was a shared visit with [name].  [name] remains in hematologic remission from [facility].  He has increased toxicity from the [place] with nausea and diarrhea.  He would like to change treatment.  We discussed nilotinib and dasatinib with him.  We will consult with the cancer center pharmacy regarding interaction of these agents with his current medical regimen.  He will return for an office visit within the next few weeks with the plan to change to a different tyrosine kinase inhibitor.  Julieanne Manson, MD

## 2017-07-21 LAB — FERRITIN: Ferritin: 33 ng/mL (ref 22–316)

## 2017-07-21 LAB — IRON AND TIBC
IRON: 37 ug/dL — AB (ref 42–163)
Saturation Ratios: 13 % — ABNORMAL LOW (ref 42–163)
TIBC: 290 ug/dL (ref 202–409)
UIBC: 253 ug/dL

## 2017-07-24 ENCOUNTER — Telehealth: Payer: Self-pay

## 2017-07-24 ENCOUNTER — Other Ambulatory Visit: Payer: Self-pay | Admitting: Nurse Practitioner

## 2017-07-24 ENCOUNTER — Telehealth: Payer: Self-pay | Admitting: Oncology

## 2017-07-24 ENCOUNTER — Telehealth: Payer: Self-pay | Admitting: *Deleted

## 2017-07-24 DIAGNOSIS — C9210 Chronic myeloid leukemia, BCR/ABL-positive, not having achieved remission: Secondary | ICD-10-CM

## 2017-07-24 NOTE — Telephone Encounter (Signed)
Scheduled appt per 3/14 los - sent reminder letter in the mail with appt date and time.

## 2017-07-24 NOTE — Telephone Encounter (Signed)
-----   Message from Owens Shark, NP sent at 07/24/2017  8:41 AM EDT ----- Please call him and find out if he is taking K Dur. Please forward BMET result to primary MD, creatinine up

## 2017-07-24 NOTE — Telephone Encounter (Signed)
[facility]   [name] is a 72 y/o male with CML who has been on imatinib 200 mg PO daily since at least 2013.   Patient seen in oncology clinic on 07/20/17, where he reports nausea, vomiting and diarrhea after taking imatinib. Per PCP, patient remains in hematologic remission from CML.   Pharmacy asked to asses whether the patient can be switched to nilotinib or dasatinib due to his toxicity from imatinib and multiple comorbidities and complicated medication list.   Medication list assessed, these medication interactions noted:   Nilotinib ([place]) - Nilotinib and Pantoprazole: Category D interaction. PPI use can lead to decreased concentrations of nilotinib, avoid combination. If patient cannot discontinue acid suppression therapy, can consider using an H2RA antagonist 10 hours before or 2 hours after nilotinib.  - Nilotinib and Vilazodone: Category C interaction. Nilotinib can cause thrombocytopenia, maybe potentiating the antiplatelet effects of SSRI and aspirin, will closely monitoring. Plts 146 on 07/20/2017.  - Nilotinib and Amlodipine and Colchicine: Category C interaction. Nilotinib inhibits CYP3A4 leading to possible increased metabolism of both amlodipine and colchicine, and possible systemic exposure. BP and peripheral edema will be monitored.   Problem list assessed, these interactions noted:    - Nilotinib and Pancreatitis: Caution when taking with pancreatitis. Can see dose limiting increases in amylase (1-10%) and lipase (28%).   Monitoring for Nilotinib  o CMET + Mg o Uric Acid (baseline) (has preexisting medically managed gout)  o Amylase, lipase (baseline and then monthly)  o EKG (baseline and then every 2 weeks after) o Lipid profile (baseline and then every 6 months)  Dasatinib ([place])  - Dasatinib and Pantoprazole: Category X interaction. PPI use can lead to decreased concentrations of dasatinib, avoid combination. Antacids are the only  option for antacid control if dasatinib is initiated. Will discuss with patient ability to discontinue PPI.  - Dasatinib and Vilazodone: Category C interaction. Dasatinib can cause thrombocytopenia, maybe potentiating the antiplatelet effects of SSRI and aspirin, will closely monitoring. Plts 146 on 07/20/2017.  - Dasatinib and Acetaminophen: Category D interaction:  Significant risk of hepatotoxicity with both agents, Will discuss with patients amount of acetaminophen used and alternate methods of pain management.   Problem list assessed, these interactions noted:   - Common AE seen include pleural effusions and increased risk of PAH. ECHO on 09/2016 showed LVEF 60-65%, with grade I diastolic dysfunction.    - Monitoring for Dasatinib  o CBC o CMET + Mg, Phos o EKG (baseline and then every 2 weeks after)  Recommend the patient bring all his pills to next clinic visit to do a comprehensive medication reconciliation.   Will await treatment decision from MD. Please let us know how pharmacy can be of further assistance.   Phillis Knack, PharmD Candidate    I have read the above and agree with plan.  Johny Drilling, PharmD, BCPS, BCOP 07/24/2017  1:11 PM  [facility] [phone]

## 2017-07-24 NOTE — Telephone Encounter (Signed)
Called pt, he reports he is taking potassium daily but he may have missed a dose or two. Instructed him to take it consistently.  BMET faxed to PCP electronically, with note about creatinine.

## 2017-08-03 LAB — BCR/ABL

## 2017-08-04 ENCOUNTER — Emergency Department (HOSPITAL_COMMUNITY): Payer: Medicare Other

## 2017-08-04 ENCOUNTER — Emergency Department (HOSPITAL_COMMUNITY)
Admission: EM | Admit: 2017-08-04 | Discharge: 2017-08-04 | Disposition: A | Discharge status: Home / Self Care | Payer: Medicare Other | Attending: Emergency Medicine | Admitting: Emergency Medicine

## 2017-08-04 ENCOUNTER — Other Ambulatory Visit: Payer: Self-pay

## 2017-08-04 DIAGNOSIS — S91115A Laceration without foreign body of left lesser toe(s) without damage to nail, initial encounter: Secondary | ICD-10-CM | POA: Diagnosis not present

## 2017-08-04 DIAGNOSIS — F17210 Nicotine dependence, cigarettes, uncomplicated: Secondary | ICD-10-CM | POA: Insufficient documentation

## 2017-08-04 DIAGNOSIS — W1789XA Other fall from one level to another, initial encounter: Secondary | ICD-10-CM | POA: Diagnosis not present

## 2017-08-04 DIAGNOSIS — Y999 Unspecified external cause status: Secondary | ICD-10-CM | POA: Insufficient documentation

## 2017-08-04 DIAGNOSIS — Z79899 Other long term (current) drug therapy: Secondary | ICD-10-CM | POA: Diagnosis not present

## 2017-08-04 DIAGNOSIS — W19XXXA Unspecified fall, initial encounter: Secondary | ICD-10-CM

## 2017-08-04 DIAGNOSIS — I10 Essential (primary) hypertension: Secondary | ICD-10-CM | POA: Insufficient documentation

## 2017-08-04 DIAGNOSIS — Z23 Encounter for immunization: Secondary | ICD-10-CM | POA: Diagnosis not present

## 2017-08-04 DIAGNOSIS — M79605 Pain in left leg: Secondary | ICD-10-CM

## 2017-08-04 DIAGNOSIS — Y9389 Activity, other specified: Secondary | ICD-10-CM | POA: Diagnosis not present

## 2017-08-04 DIAGNOSIS — Y9289 Other specified places as the place of occurrence of the external cause: Secondary | ICD-10-CM | POA: Diagnosis not present

## 2017-08-04 DIAGNOSIS — S92522A Displaced fracture of medial phalanx of left lesser toe(s), initial encounter for closed fracture: Secondary | ICD-10-CM | POA: Diagnosis not present

## 2017-08-04 MED ORDER — OXYCODONE-ACETAMINOPHEN 5-325 MG PO TABS: 1.0000 | ORAL_TABLET | Freq: Four times a day (QID) | ORAL | 0 refills | Status: DC | PRN

## 2017-08-04 MED ORDER — CEPHALEXIN 500 MG PO CAPS: 500.0000 mg | ORAL_CAPSULE | Freq: Four times a day (QID) | ORAL | 0 refills | Status: AC

## 2017-08-04 MED ORDER — TETANUS-DIPHTH-ACELL PERTUSSIS 5-2.5-18.5 LF-MCG/0.5 IM SUSP
0.5000 mL | Freq: Once | INTRAMUSCULAR | Status: AC
  Administered 2017-08-04: 0.5 mL via INTRAMUSCULAR
  Filled 2017-08-04: qty 0.5

## 2017-08-04 MED ORDER — BACITRACIN ZINC 500 UNIT/GM EX OINT
TOPICAL_OINTMENT | CUTANEOUS | Status: AC
  Administered 2017-08-04: 20:00:00
  Filled 2017-08-04: qty 0.9

## 2017-08-04 MED ORDER — LIDOCAINE-EPINEPHRINE (PF) 2 %-1:200000 IJ SOLN
10.0000 mL | Freq: Once | INTRAMUSCULAR | Status: AC
  Administered 2017-08-04: 10 mL
  Filled 2017-08-04: qty 20

## 2017-08-04 NOTE — ED Provider Notes (Signed)
Medical screening examination/treatment/procedure(s) were conducted as a shared visit with non-physician practitioner(s) and myself.  I personally evaluated the patient during the encounter.  None 72 year old male here after mechanical fall just prior to arrival where he sustained a laceration to the distal sole of his left foot at the second and third digits.  No open fracture appreciated.  No other injuries noted.  Will check routine blood work and laceration to be repaired by APP   Lacretia Leigh, MD 08/04/17 1544

## 2017-08-04 NOTE — Discharge Instructions (Signed)
Please take antibiotics as directed.  Percocet as needed for pain.  Be careful with this medication as it can make you drowsy.  Please remain in postop shoe, keep your dressing clean and dry and you will need to follow-up with Dr. Sharol Given please call his office on Monday morning.  Your stitches will need to come out in 7-10 days.  Please keep area clean and dry do not submerge underwater but you may shower.  If you have worsening pain, redness swelling or drainage from your lacerations or any other new or concerning symptoms please return to the emergency department for reevaluation

## 2017-08-04 NOTE — ED Notes (Signed)
Bed: ZD66 Expected date:  Expected time:  Means of arrival:  Comments: 72 yo laceration
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
## 2017-08-04 NOTE — ED Triage Notes (Addendum)
Getting out of car, twisted ankle, cut foot. 2-3 cm lac under middle toe. 110/70 en route. Pt is a right leg amputee since 1968

## 2017-08-10 ENCOUNTER — Other Ambulatory Visit: Payer: Self-pay

## 2017-08-10 ENCOUNTER — Ambulatory Visit: Payer: Self-pay | Admitting: Oncology

## 2017-08-15 ENCOUNTER — Inpatient Hospital Stay (HOSPITAL_BASED_OUTPATIENT_CLINIC_OR_DEPARTMENT_OTHER): Payer: Medicare Other | Admitting: Nurse Practitioner

## 2017-08-15 ENCOUNTER — Inpatient Hospital Stay: Payer: Medicare Other | Attending: Nurse Practitioner

## 2017-08-15 ENCOUNTER — Telehealth: Payer: Self-pay | Admitting: Oncology

## 2017-08-15 VITALS — BP 159/88 | HR 71 | Temp 98.5°F | Resp 18 | Ht 69.0 in | Wt 149.2 lb

## 2017-08-15 DIAGNOSIS — K521 Toxic gastroenteritis and colitis: Secondary | ICD-10-CM | POA: Insufficient documentation

## 2017-08-15 DIAGNOSIS — E876 Hypokalemia: Secondary | ICD-10-CM | POA: Diagnosis not present

## 2017-08-15 DIAGNOSIS — Z87891 Personal history of nicotine dependence: Secondary | ICD-10-CM | POA: Diagnosis not present

## 2017-08-15 DIAGNOSIS — R630 Anorexia: Secondary | ICD-10-CM | POA: Insufficient documentation

## 2017-08-15 DIAGNOSIS — T451X5S Adverse effect of antineoplastic and immunosuppressive drugs, sequela: Secondary | ICD-10-CM | POA: Insufficient documentation

## 2017-08-15 DIAGNOSIS — C9210 Chronic myeloid leukemia, BCR/ABL-positive, not having achieved remission: Secondary | ICD-10-CM

## 2017-08-15 DIAGNOSIS — Z8614 Personal history of Methicillin resistant Staphylococcus aureus infection: Secondary | ICD-10-CM

## 2017-08-15 DIAGNOSIS — Z79899 Other long term (current) drug therapy: Secondary | ICD-10-CM | POA: Insufficient documentation

## 2017-08-15 LAB — CBC WITH DIFFERENTIAL (CANCER CENTER ONLY)
BASOS ABS: 0 10*3/uL (ref 0.0–0.1)
BASOS PCT: 0 %
EOS ABS: 0.5 10*3/uL (ref 0.0–0.5)
EOS PCT: 8 %
HCT: 34.1 % — ABNORMAL LOW (ref 38.4–49.9)
Hemoglobin: 11 g/dL — ABNORMAL LOW (ref 13.0–17.1)
Lymphocytes Relative: 26 %
Lymphs Abs: 1.4 10*3/uL (ref 0.9–3.3)
MCH: 25.6 pg — AB (ref 27.2–33.4)
MCHC: 32.3 g/dL (ref 32.0–36.0)
MCV: 79.5 fL (ref 79.3–98.0)
Monocytes Absolute: 0.4 10*3/uL (ref 0.1–0.9)
Monocytes Relative: 7 %
NEUTROS PCT: 59 %
Neutro Abs: 3.1 10*3/uL (ref 1.5–6.5)
PLATELETS: 177 10*3/uL (ref 140–400)
RBC: 4.29 MIL/uL (ref 4.20–5.82)
RDW: 17.9 % — ABNORMAL HIGH (ref 11.0–14.6)
WBC: 5.3 10*3/uL (ref 4.0–10.3)

## 2017-08-15 LAB — CMP (CANCER CENTER ONLY)
ALBUMIN: 3 g/dL — AB (ref 3.5–5.0)
ALT: 39 U/L (ref 0–55)
AST: 39 U/L — AB (ref 5–34)
Alkaline Phosphatase: 127 U/L (ref 40–150)
Anion gap: 10 (ref 3–11)
BUN: 18 mg/dL (ref 7–26)
CHLORIDE: 106 mmol/L (ref 98–109)
CO2: 25 mmol/L (ref 22–29)
CREATININE: 1.68 mg/dL — AB (ref 0.70–1.30)
Calcium: 8.9 mg/dL (ref 8.4–10.4)
GFR, EST AFRICAN AMERICAN: 45 mL/min — AB (ref >60)
GFR, Estimated: 39 mL/min — ABNORMAL LOW (ref >60)
GLUCOSE: 100 mg/dL (ref 70–140)
Potassium: 3.1 mmol/L — ABNORMAL LOW (ref 3.5–5.1)
SODIUM: 141 mmol/L (ref 136–145)
Total Bilirubin: <0.2 mg/dL — ABNORMAL LOW (ref 0.2–1.2)
Total Protein: 5.6 g/dL — ABNORMAL LOW (ref 6.4–8.3)

## 2017-08-15 LAB — MAGNESIUM: Magnesium: 1.8 mg/dL (ref 1.5–2.5)

## 2017-08-15 MED ORDER — DIPHENOXYLATE-ATROPINE 2.5-0.025 MG PO TABS: ORAL_TABLET | ORAL | 0 refills | Status: DC

## 2017-08-15 NOTE — Patient Instructions (Signed)
STOP protonix (pantoprazole) and tylenol (acetaminophen)
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
## 2017-08-15 NOTE — Telephone Encounter (Signed)
Appt scheduled AVS/Calendar printed per 4/9 los °

## 2017-08-17 ENCOUNTER — Telehealth: Payer: Self-pay | Admitting: Pharmacy Technician

## 2017-08-17 NOTE — Telephone Encounter (Signed)
Oral Oncology Patient Advocate Encounter  Prior Authorization for Sprycel has been approved.    Effective dates: 08/17/2017 through 08/18/2018  Oral Oncology Clinic will continue to follow.   Fabio Asa. Melynda Keller, [place] [facility] [place] [phone] 08/17/2017 4:22 PM

## 2017-08-17 NOTE — Telephone Encounter (Signed)
Oral Oncology Patient Advocate Encounter  Received notification from [facility] that prior authorization for Sprycel is required.  PA submitted on CoverMyMeds Key [id number] Status is pending  Oral Oncology Clinic will continue to follow.  Fabio Asa. Melynda Keller, [place] [facility] [place] [phone] 08/17/2017 8:09 AM

## 2017-08-18 ENCOUNTER — Telehealth: Payer: Self-pay

## 2017-08-18 ENCOUNTER — Other Ambulatory Visit: Payer: Self-pay | Admitting: Pharmacist

## 2017-08-18 DIAGNOSIS — C9210 Chronic myeloid leukemia, BCR/ABL-positive, not having achieved remission: Secondary | ICD-10-CM

## 2017-08-18 DIAGNOSIS — I10 Essential (primary) hypertension: Secondary | ICD-10-CM

## 2017-08-18 MED ORDER — DASATINIB 100 MG PO TABS: 100.0000 mg | ORAL_TABLET | Freq: Every day | ORAL | 0 refills | Status: DC

## 2017-08-18 NOTE — Telephone Encounter (Signed)
thanks

## 2017-08-18 NOTE — Telephone Encounter (Signed)
Oral Chemotherapy Pharmacist Encounter  I spoke with patient for overview of: Sprycel for the treatment of chronic phase chronic myeloid leukemia, planned duration until disease progression or unacceptable toxicity.  Labs from 08/15/2017 assessed, OK for treatment. Noted elevated SCr 1.68, CrCl ~38, no renal dose adjustments needed at this time.   08/15/2017 QTc 464 msec  Pt is doing well. Counseled patient on administration, dosing, side effects, and monitoring.   Discussed extensively with patient to discontinue pantoprazole and acetaminophen.  Counseled patient to use an antacid, if symptoms occur. He will call the office if he is unable to tolerate discontinuation of PPI.   Patient will take Sprycel 100 mg tablets, 1 tablet by mouth once daily without regard to food. Patient knows to avoid grapefruit or grapefruit juice. Sprycel start date: Week of 08/22/2017  Patient copay for 47-month supply will be $3.80.   Side effects include but not limited to: fluid retention, nausea, diarrhea, rash, fatigue, dyspnea, hemorrhage, and thrombocytopenia.  Reviewed with patient importance of keeping a medication schedule and plan for any missed doses.  Patient voiced understanding and appreciation.   All questions answered. Medication reconciliation performed and medication/allergy list updated. Patient reported taking amlodipine 10 mg once daily, which was added to medication list.   Patient knows to call the office with questions or concerns. Oral Oncology Clinic will continue to follow.  Thank you,  Phillis Knack, [facility] [phone]

## 2017-08-18 NOTE — Telephone Encounter (Signed)
Oral Oncology Pharmacist Encounter  Prescription for Sprycel has been e- scribed to the [facility] outpatient pharmacy. Sprycel will be made ready for pickup at the pharmacy by Tuesday, 08/22/2017. Copayment for first fill is $3.80, patient is aware, patient states this is affordable at this time.  Patient knows to call the office with any additional questions or concerns.  Oral Oncology Clinic will continue to follow.  Johny Drilling, PharmD, BCPS, BCOP 08/18/2017 4:20 PM Oral Oncology Clinic [phone]

## 2017-08-24 ENCOUNTER — Other Ambulatory Visit: Payer: Self-pay

## 2017-08-24 ENCOUNTER — Emergency Department (HOSPITAL_COMMUNITY)
Admission: EM | Admit: 2017-08-24 | Discharge: 2017-08-24 | Disposition: A | Discharge status: Home / Self Care | Payer: Medicare Other | Attending: Emergency Medicine | Admitting: Emergency Medicine

## 2017-08-24 ENCOUNTER — Emergency Department (HOSPITAL_COMMUNITY): Payer: Medicare Other

## 2017-08-24 ENCOUNTER — Encounter (HOSPITAL_COMMUNITY): Payer: Self-pay

## 2017-08-24 DIAGNOSIS — R197 Diarrhea, unspecified: Secondary | ICD-10-CM | POA: Insufficient documentation

## 2017-08-24 DIAGNOSIS — Z96652 Presence of left artificial knee joint: Secondary | ICD-10-CM | POA: Insufficient documentation

## 2017-08-24 DIAGNOSIS — R112 Nausea with vomiting, unspecified: Secondary | ICD-10-CM | POA: Diagnosis not present

## 2017-08-24 DIAGNOSIS — Z79899 Other long term (current) drug therapy: Secondary | ICD-10-CM | POA: Diagnosis not present

## 2017-08-24 DIAGNOSIS — Z4802 Encounter for removal of sutures: Secondary | ICD-10-CM | POA: Insufficient documentation

## 2017-08-24 DIAGNOSIS — I10 Essential (primary) hypertension: Secondary | ICD-10-CM | POA: Insufficient documentation

## 2017-08-24 DIAGNOSIS — R1084 Generalized abdominal pain: Secondary | ICD-10-CM | POA: Insufficient documentation

## 2017-08-24 DIAGNOSIS — I252 Old myocardial infarction: Secondary | ICD-10-CM | POA: Insufficient documentation

## 2017-08-24 DIAGNOSIS — F17210 Nicotine dependence, cigarettes, uncomplicated: Secondary | ICD-10-CM | POA: Diagnosis not present

## 2017-08-24 DIAGNOSIS — Z856 Personal history of leukemia: Secondary | ICD-10-CM | POA: Diagnosis not present

## 2017-08-24 LAB — CBC WITH DIFFERENTIAL/PLATELET
BASOS ABS: 0 10*3/uL (ref 0.0–0.1)
Basophils Relative: 1 %
EOS ABS: 0.3 10*3/uL (ref 0.0–0.7)
Eosinophils Relative: 8 %
HCT: 29.2 % — ABNORMAL LOW (ref 39.0–52.0)
Hemoglobin: 9.4 g/dL — ABNORMAL LOW (ref 13.0–17.0)
LYMPHS PCT: 21 %
Lymphs Abs: 0.9 10*3/uL (ref 0.7–4.0)
MCH: 25.9 pg — ABNORMAL LOW (ref 26.0–34.0)
MCHC: 32.2 g/dL (ref 30.0–36.0)
MCV: 80.4 fL (ref 78.0–100.0)
MONO ABS: 0.3 10*3/uL (ref 0.1–1.0)
Monocytes Relative: 7 %
Neutro Abs: 2.7 10*3/uL (ref 1.7–7.7)
Neutrophils Relative %: 65 %
Platelets: 130 10*3/uL — ABNORMAL LOW (ref 150–400)
RBC: 3.63 MIL/uL — AB (ref 4.22–5.81)
RDW: 18.1 % — AB (ref 11.5–15.5)
WBC: 4.2 10*3/uL (ref 4.0–10.5)

## 2017-08-24 LAB — OCCULT BLOOD X 1 CARD TO LAB, STOOL: Fecal Occult Bld: NEGATIVE

## 2017-08-24 LAB — COMPREHENSIVE METABOLIC PANEL
ALBUMIN: 3.3 g/dL — AB (ref 3.5–5.0)
ALK PHOS: 117 U/L (ref 38–126)
ALT: 24 U/L (ref 17–63)
AST: 32 U/L (ref 15–41)
Anion gap: 10 (ref 5–15)
BILIRUBIN TOTAL: 0.5 mg/dL (ref 0.3–1.2)
BUN: 18 mg/dL (ref 6–20)
CALCIUM: 8.7 mg/dL — AB (ref 8.9–10.3)
CO2: 27 mmol/L (ref 22–32)
Chloride: 109 mmol/L (ref 101–111)
Creatinine, Ser: 1.61 mg/dL — ABNORMAL HIGH (ref 0.61–1.24)
GFR calc Af Amer: 48 mL/min — ABNORMAL LOW (ref >60)
GFR, EST NON AFRICAN AMERICAN: 41 mL/min — AB (ref >60)
GLUCOSE: 101 mg/dL — AB (ref 65–99)
POTASSIUM: 3.9 mmol/L (ref 3.5–5.1)
Sodium: 146 mmol/L — ABNORMAL HIGH (ref 135–145)
TOTAL PROTEIN: 5.8 g/dL — AB (ref 6.5–8.1)

## 2017-08-24 LAB — URINALYSIS, ROUTINE W REFLEX MICROSCOPIC
BACTERIA UA: NONE SEEN
BILIRUBIN URINE: NEGATIVE
Glucose, UA: NEGATIVE mg/dL
Hgb urine dipstick: NEGATIVE
KETONES UR: NEGATIVE mg/dL
LEUKOCYTES UA: NEGATIVE
NITRITE: NEGATIVE
Protein, ur: 100 mg/dL — AB
SQUAMOUS EPITHELIAL / LPF: NONE SEEN
Specific Gravity, Urine: 1.01 (ref 1.005–1.030)
pH: 7 (ref 5.0–8.0)

## 2017-08-24 LAB — TYPE AND SCREEN
ABO/RH(D): O POS
Antibody Screen: NEGATIVE

## 2017-08-24 LAB — LIPASE, BLOOD: Lipase: 73 U/L — ABNORMAL HIGH (ref 11–51)

## 2017-08-24 MED ORDER — ONDANSETRON 4 MG PO TBDP: 4.0000 mg | ORAL_TABLET | Freq: Three times a day (TID) | ORAL | 0 refills | Status: DC | PRN

## 2017-08-24 MED ORDER — SODIUM CHLORIDE 0.9 % IV BOLUS
1000.0000 mL | Freq: Once | INTRAVENOUS | Status: AC
  Administered 2017-08-24: 1000 mL via INTRAVENOUS

## 2017-08-24 MED ORDER — PANTOPRAZOLE SODIUM 40 MG IV SOLR
40.0000 mg | Freq: Once | INTRAVENOUS | Status: AC
  Administered 2017-08-24: 40 mg via INTRAVENOUS
  Filled 2017-08-24: qty 40

## 2017-08-24 MED ORDER — SODIUM CHLORIDE 0.9 % IV SOLN
INTRAVENOUS | Status: DC
  Administered 2017-08-24: 12:00:00 via INTRAVENOUS

## 2017-08-24 NOTE — ED Notes (Signed)
Bed: RA74 Expected date:  Expected time:  Means of arrival:  Comments: 72 yo m, abd pain x 3 days, poss GI bleeding
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
## 2017-08-24 NOTE — Discharge Instructions (Addendum)
Try to get to the [facility] outpatient pharmacy to get your prescription of Sprycel as prescribed by your doctor.

## 2017-08-24 NOTE — ED Triage Notes (Signed)
EMS reports from home, diarrhea with blood tinged feces x 3 days, generalized abdominal pain  BP 164/99 HR 84 Resp 16 Sp02 100 RA CBG 114  22ga L hand  4mg  zofran enroute

## 2017-08-29 ENCOUNTER — Other Ambulatory Visit: Payer: Self-pay

## 2017-09-04 ENCOUNTER — Inpatient Hospital Stay (HOSPITAL_BASED_OUTPATIENT_CLINIC_OR_DEPARTMENT_OTHER): Payer: Medicare Other | Admitting: Oncology

## 2017-09-04 ENCOUNTER — Inpatient Hospital Stay: Payer: Medicare Other

## 2017-09-04 ENCOUNTER — Telehealth: Payer: Self-pay | Admitting: Oncology

## 2017-09-04 ENCOUNTER — Inpatient Hospital Stay: Payer: Medicare Other | Admitting: Oncology

## 2017-09-04 ENCOUNTER — Other Ambulatory Visit: Payer: Self-pay | Admitting: *Deleted

## 2017-09-04 VITALS — BP 141/83 | HR 72 | Temp 98.2°F | Resp 16 | Ht 69.0 in | Wt 146.3 lb

## 2017-09-04 DIAGNOSIS — Z87891 Personal history of nicotine dependence: Secondary | ICD-10-CM

## 2017-09-04 DIAGNOSIS — Z79899 Other long term (current) drug therapy: Secondary | ICD-10-CM | POA: Diagnosis not present

## 2017-09-04 DIAGNOSIS — E876 Hypokalemia: Secondary | ICD-10-CM

## 2017-09-04 DIAGNOSIS — Z8614 Personal history of Methicillin resistant Staphylococcus aureus infection: Secondary | ICD-10-CM

## 2017-09-04 DIAGNOSIS — C9210 Chronic myeloid leukemia, BCR/ABL-positive, not having achieved remission: Secondary | ICD-10-CM

## 2017-09-04 LAB — CBC WITH DIFFERENTIAL (CANCER CENTER ONLY)
BASOS ABS: 0 10*3/uL (ref 0.0–0.1)
BASOS PCT: 1 %
EOS ABS: 0.3 10*3/uL (ref 0.0–0.5)
EOS PCT: 6 %
HCT: 33.4 % — ABNORMAL LOW (ref 38.4–49.9)
HEMOGLOBIN: 10.6 g/dL — AB (ref 13.0–17.1)
LYMPHS ABS: 1.2 10*3/uL (ref 0.9–3.3)
Lymphocytes Relative: 29 %
MCH: 25 pg — AB (ref 27.2–33.4)
MCHC: 31.7 g/dL — ABNORMAL LOW (ref 32.0–36.0)
MCV: 78.7 fL — ABNORMAL LOW (ref 79.3–98.0)
Monocytes Absolute: 0.4 10*3/uL (ref 0.1–0.9)
Monocytes Relative: 9 %
Neutro Abs: 2.3 10*3/uL (ref 1.5–6.5)
Neutrophils Relative %: 55 %
PLATELETS: 137 10*3/uL — AB (ref 140–400)
RBC: 4.25 MIL/uL (ref 4.20–5.82)
RDW: 17.8 % — ABNORMAL HIGH (ref 11.0–14.6)
WBC: 4.2 10*3/uL (ref 4.0–10.3)

## 2017-09-04 LAB — CMP (CANCER CENTER ONLY)
ALT: 73 U/L — AB (ref 0–55)
AST: 112 U/L — AB (ref 5–34)
Albumin: 3.4 g/dL — ABNORMAL LOW (ref 3.5–5.0)
Alkaline Phosphatase: 149 U/L (ref 40–150)
Anion gap: 10 (ref 3–11)
BILIRUBIN TOTAL: 0.3 mg/dL (ref 0.2–1.2)
BUN: 21 mg/dL (ref 7–26)
CHLORIDE: 106 mmol/L (ref 98–109)
CO2: 28 mmol/L (ref 22–29)
CREATININE: 1.94 mg/dL — AB (ref 0.70–1.30)
Calcium: 8.9 mg/dL (ref 8.4–10.4)
GFR, EST AFRICAN AMERICAN: 38 mL/min — AB (ref >60)
GFR, EST NON AFRICAN AMERICAN: 33 mL/min — AB (ref >60)
Glucose, Bld: 86 mg/dL (ref 70–140)
Potassium: 3.1 mmol/L — ABNORMAL LOW (ref 3.5–5.1)
Sodium: 144 mmol/L (ref 136–145)
TOTAL PROTEIN: 5.7 g/dL — AB (ref 6.4–8.3)

## 2017-09-04 MED ORDER — ONDANSETRON 4 MG PO TBDP: 4.0000 mg | ORAL_TABLET | Freq: Three times a day (TID) | ORAL | 0 refills | Status: DC | PRN

## 2017-09-04 MED FILL — SPRYCEL 100 MG TABLET: 100 | 30 days supply | Qty: 30 | Fill #0

## 2017-09-04 NOTE — Telephone Encounter (Signed)
Appointments scheduled AVS/Calendar printed per 4/29 los °

## 2017-09-04 NOTE — Progress Notes (Addendum)
[facility] OFFICE PROGRESS NOTE   Diagnosis: CML  INTERVAL HISTORY:   Hynek returns as scheduled.  He has not started dasatinib.  He was seen in the emergency room 08/24/2017 with abdominal pain, nausea, and diarrhea.  He reports the diarrhea has resolved.  His pain is better since he began taking ondansetron every 8 hours. He is not taking potassium twice daily on a consistent schedule.  He remains on Gleevec.   Objective:  Vital signs in last 24 hours:  There were no vitals taken for this visit.    HEENT: No thrush or ulcers Resp: Inspiratory rhonchi at the lower posterior chest bilaterally, no respiratory distress Cardio: Regular rate and rhythm GI: No hepatomegaly, no mass, nontender Vascular: No leg edema   Lab Results:  Lab Results  Component Value Date   WBC 4.2 09/04/2017   HGB 10.6 (L) 09/04/2017   HCT 33.4 (L) 09/04/2017   MCV 78.7 (L) 09/04/2017   PLT 137 (L) 09/04/2017   NEUTROABS 2.3 09/04/2017    CMP     Component Value Date/Time   NA 144 09/04/2017 1355   NA 143 05/11/2017 1459   K 3.1 (L) 09/04/2017 1355   K 3.4 (L) 05/11/2017 1459   CL 106 09/04/2017 1355   CL 105 08/07/2012 1339   CO2 28 09/04/2017 1355   CO2 27 05/11/2017 1459   GLUCOSE 86 09/04/2017 1355   GLUCOSE 97 05/11/2017 1459   GLUCOSE 115 (H) 08/07/2012 1339   BUN 21 09/04/2017 1355   BUN 16.0 05/11/2017 1459   CREATININE 1.94 (H) 09/04/2017 1355   CREATININE 1.7 (H) 05/11/2017 1459   CALCIUM 8.9 09/04/2017 1355   CALCIUM 9.2 05/11/2017 1459   PROT 5.7 (L) 09/04/2017 1355   PROT 5.9 (L) 05/11/2017 1459   ALBUMIN 3.4 (L) 09/04/2017 1355   ALBUMIN 3.6 05/11/2017 1459   AST 112 (H) 09/04/2017 1355   AST 28 05/11/2017 1459   ALT 73 (H) 09/04/2017 1355   ALT 25 05/11/2017 1459   ALKPHOS 149 09/04/2017 1355   ALKPHOS 114 05/11/2017 1459   BILITOT 0.3 09/04/2017 1355   BILITOT 0.26 05/11/2017 1459   GFRNONAA 33 (L) 09/04/2017 1355   GFRAA 38 (L) 09/04/2017  1355     Medications: I have reviewed the patient's current medications.   Assessment/Plan: 1. Chronic myelogenous leukemia, diagnosed in January of 2009. He remains in hematologic remission. He is taking Gleevec at a dose of 200 mg daily. The peripheral blood PCR was markedly increased in May 2013, likely reflecting medical noncompliance. The peripheral blood PCRwas stable on 05/11/2017  2. 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 3. Status post C3-C4, C4-C5, anterior cervical diskectomy and fusion with allograft and plating 09/30/2010. 4. 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 5. Depression 6. Diarrhea, ? related to chronic pancreatitis versus Gleevec. He takes Lomotil as needed.  7. Indurated facial skin lesion 11/20/2007 with a history of MRSA skin infection of the submental area in May 2008. The induration resolved with doxycycline. 8. Left knee arthroscopy 05/25/2007. 9. Postoperative left knee effusion/pain, likely related to gout. 10. History of gout.  11. Chronic pancreatitis. 12. Status post right above-the-knee amputation. 13. MRSA infection of the submental area May 2008. 14. History of tobacco, alcohol, and cocaine use. 15. History of coronary artery disease. 16. "Shotty" lymphadenopathy of the neck, axilla, and left groin in 2009.  17. History of a microcytic  anemia.  18. History of a right olecranon bursa lesion, ? gouty tophus. 19. 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 He previously took AndroGel. 20. History of anemia secondary to chronic disease and Gleevec therapy.  21. history of anorexia-potentially related to [place]. He reports Medicaid would not pay for Megace.  22. Chronic left knee and left foot pain.  23. Status post removal of a left knee screw 07/03/2012. 24. History of hypokalemia. Likely related to diarrhea.He is on a potassium supplement. 25. Status post left foot  surgery. 26. Pulsatile fullness right neck. Evaluated by vascular surgery status post CT angiogram 03/07/2014 with no carotid artery aneurysm identified. 27. Cough-abnormal lung exam 05/12/2015. He completed a course of Levaquin. Chest xray negative. 28. Hospitalization 09/20/2016 through 10/07/2016 with unresponsiveness/acute metabolic encephalopathy    Disposition: Mr. Mellinger appears stable.  He has not started dasatinib.  I recommended he discontinue Gleevec and begin dasatinib tomorrow.  He understands to remain off of Protonix while on dasatinib.  He will take an antiacid 2 hours before or 2 hours after the dasatinib.  I encouraged him to take potassium twice daily as prescribed.  He will continue ondansetron as needed for nausea.  Mr. Scheib will return for an office visit in 2 weeks.  25 minutes were spent with the patient today.  The majority of the time was used for counseling and coordination of care   [name], MD  09/04/2017  3:35 PM

## 2017-09-05 ENCOUNTER — Telehealth: Payer: Self-pay | Admitting: *Deleted

## 2017-09-05 MED FILL — ONDANSETRON ODT 4 MG TABLET: 4 | 6 days supply | Qty: 20 | Fill #0

## 2017-09-05 NOTE — Telephone Encounter (Signed)
Called pt to remind him to stop Protonix. Informed him he may take Tums or Maalox PRN as long as he separates it 2 hours before or after Sprycel. He voiced understanding.

## 2017-09-07 ENCOUNTER — Telehealth: Payer: Self-pay | Admitting: *Deleted

## 2017-09-07 NOTE — Telephone Encounter (Signed)
Received call from pt stating that he is having more diarrhea from new med dasatinib.  He states he has taken 3 doses.  Last dose was yest & none today.  He states he has had diarrhea 3-4 x's per day & runs like water.  He has soiled the bed twice.  He is also out of lomotil & request script be sent to [facility].  He reports feeling weak & needs to eat.  Suggested that he push clear liquids for now. Message called & routed to Dr Sherrill/Pod RN

## 2017-09-07 NOTE — Telephone Encounter (Signed)
Returned call to pt, he reports 2 large incontinent loose stools today. Doesn't want to take the dasatinib until he gets "some strength back." Pt reports nausea is improved. He reports he took Zantac for past two days. Reinforced antacid instructions given on 4/30: STOP Zantac, take tums or maalox as needed.  Discussed report of diarrhea with Lattie Haw, NP: She recommends resuming lomotil for diarrhea. Continue dasatinib and call with update on 5/6. Called pt with above recommendation. He stated he "can't take the chemo pill because I took a BC" for leg pain. Pt stated he will resume dasatinib tomorrow.  Will make providers aware.

## 2017-09-11 ENCOUNTER — Telehealth: Payer: Self-pay

## 2017-09-11 NOTE — Telephone Encounter (Signed)
Pt called to report "I am not going to be able to take this medication [dasatinib]. This is worse than the Gleevac". Pt reports "going to the bathroom every 5-59mins and its large amounts". Pt confirms that he has been taking the medication as prescribed with food. States that "night before last I vomited and it seemed like I couldn't stop". Pt reports resolution of symptoms so far today. Pt will continue to take anti-diarrheals as prescribed as needed.  Per MD, pt to hold dasatinib today, Tuesday, Wednesday and call back Thursday AM with update in symptoms. Pt voiced understanding, will follow up with our office.

## 2017-09-14 ENCOUNTER — Telehealth: Payer: Self-pay

## 2017-09-14 NOTE — Telephone Encounter (Signed)
Pt reports that symptoms reported Monday have improved since he's been holding then dasatinib. "my diarrhea is under control and my appetite is getting better. I prefer the Gleevac my symptoms weren't that bad with that". This RN voiced understanding. Will consult MD.   Per Dr. [name] pt can resume back on Gleevac at previous dose. Pt voiced understanding.

## 2017-09-19 ENCOUNTER — Inpatient Hospital Stay: Payer: Medicare Other | Attending: Nurse Practitioner | Admitting: Nurse Practitioner

## 2017-09-19 ENCOUNTER — Inpatient Hospital Stay: Payer: Medicare Other

## 2017-09-19 DIAGNOSIS — R110 Nausea: Secondary | ICD-10-CM | POA: Insufficient documentation

## 2017-09-19 DIAGNOSIS — R197 Diarrhea, unspecified: Secondary | ICD-10-CM | POA: Insufficient documentation

## 2017-09-19 DIAGNOSIS — Z8614 Personal history of Methicillin resistant Staphylococcus aureus infection: Secondary | ICD-10-CM | POA: Insufficient documentation

## 2017-09-19 DIAGNOSIS — C9210 Chronic myeloid leukemia, BCR/ABL-positive, not having achieved remission: Secondary | ICD-10-CM | POA: Insufficient documentation

## 2017-09-19 DIAGNOSIS — I2510 Atherosclerotic heart disease of native coronary artery without angina pectoris: Secondary | ICD-10-CM | POA: Insufficient documentation

## 2017-09-19 DIAGNOSIS — Z87891 Personal history of nicotine dependence: Secondary | ICD-10-CM | POA: Insufficient documentation

## 2017-09-19 DIAGNOSIS — Z79899 Other long term (current) drug therapy: Secondary | ICD-10-CM | POA: Insufficient documentation

## 2017-09-20 NOTE — Progress Notes (Signed)
Prior auth for Lomotil has been submitted. Status is pending.

## 2017-09-26 ENCOUNTER — Other Ambulatory Visit: Payer: Self-pay | Admitting: Emergency Medicine

## 2017-09-26 DIAGNOSIS — C9210 Chronic myeloid leukemia, BCR/ABL-positive, not having achieved remission: Secondary | ICD-10-CM

## 2017-09-26 MED ORDER — POTASSIUM CHLORIDE CRYS ER 20 MEQ PO TBCR: 20.0000 meq | EXTENDED_RELEASE_TABLET | Freq: Every day | ORAL | 1 refills | Status: DC

## 2017-09-29 ENCOUNTER — Other Ambulatory Visit: Payer: Self-pay

## 2017-09-29 DIAGNOSIS — C9210 Chronic myeloid leukemia, BCR/ABL-positive, not having achieved remission: Secondary | ICD-10-CM

## 2017-10-04 ENCOUNTER — Telehealth: Payer: Self-pay

## 2017-10-04 ENCOUNTER — Inpatient Hospital Stay: Payer: Medicare Other

## 2017-10-04 ENCOUNTER — Encounter: Payer: Self-pay | Admitting: Nurse Practitioner

## 2017-10-04 ENCOUNTER — Inpatient Hospital Stay (HOSPITAL_BASED_OUTPATIENT_CLINIC_OR_DEPARTMENT_OTHER): Payer: Medicare Other | Admitting: Nurse Practitioner

## 2017-10-04 VITALS — BP 156/94 | HR 57 | Temp 98.2°F | Resp 18 | Ht 69.0 in | Wt 147.7 lb

## 2017-10-04 DIAGNOSIS — I2510 Atherosclerotic heart disease of native coronary artery without angina pectoris: Secondary | ICD-10-CM

## 2017-10-04 DIAGNOSIS — Z79899 Other long term (current) drug therapy: Secondary | ICD-10-CM

## 2017-10-04 DIAGNOSIS — Z8614 Personal history of Methicillin resistant Staphylococcus aureus infection: Secondary | ICD-10-CM | POA: Diagnosis not present

## 2017-10-04 DIAGNOSIS — R197 Diarrhea, unspecified: Secondary | ICD-10-CM

## 2017-10-04 DIAGNOSIS — Z87891 Personal history of nicotine dependence: Secondary | ICD-10-CM

## 2017-10-04 DIAGNOSIS — C9210 Chronic myeloid leukemia, BCR/ABL-positive, not having achieved remission: Secondary | ICD-10-CM | POA: Diagnosis present

## 2017-10-04 DIAGNOSIS — R110 Nausea: Secondary | ICD-10-CM

## 2017-10-04 LAB — CMP (CANCER CENTER ONLY)
ALK PHOS: 144 U/L (ref 40–150)
ALT: 44 U/L (ref 0–55)
AST: 55 U/L — AB (ref 5–34)
Albumin: 3.3 g/dL — ABNORMAL LOW (ref 3.5–5.0)
Anion gap: 11 (ref 3–11)
BUN: 19 mg/dL (ref 7–26)
CO2: 24 mmol/L (ref 22–29)
Calcium: 8.5 mg/dL (ref 8.4–10.4)
Chloride: 108 mmol/L (ref 98–109)
Creatinine: 1.86 mg/dL — ABNORMAL HIGH (ref 0.70–1.30)
GFR, EST NON AFRICAN AMERICAN: 35 mL/min — AB (ref >60)
GFR, Est AFR Am: 40 mL/min — ABNORMAL LOW (ref >60)
Glucose, Bld: 114 mg/dL (ref 70–140)
Potassium: 3.3 mmol/L — ABNORMAL LOW (ref 3.5–5.1)
Sodium: 143 mmol/L (ref 136–145)
Total Bilirubin: 0.5 mg/dL (ref 0.2–1.2)
Total Protein: 5.6 g/dL — ABNORMAL LOW (ref 6.4–8.3)

## 2017-10-04 LAB — CBC WITH DIFFERENTIAL (CANCER CENTER ONLY)
Basophils Absolute: 0 10*3/uL (ref 0.0–0.1)
Basophils Relative: 0 %
EOS ABS: 0.4 10*3/uL (ref 0.0–0.5)
EOS PCT: 8 %
HCT: 32.1 % — ABNORMAL LOW (ref 38.4–49.9)
Hemoglobin: 10.4 g/dL — ABNORMAL LOW (ref 13.0–17.1)
Lymphocytes Relative: 27 %
Lymphs Abs: 1.3 10*3/uL (ref 0.9–3.3)
MCH: 25.2 pg — AB (ref 27.2–33.4)
MCHC: 32.4 g/dL (ref 32.0–36.0)
MCV: 77.7 fL — ABNORMAL LOW (ref 79.3–98.0)
Monocytes Absolute: 0.5 10*3/uL (ref 0.1–0.9)
Monocytes Relative: 10 %
Neutro Abs: 2.8 10*3/uL (ref 1.5–6.5)
Neutrophils Relative %: 55 %
PLATELETS: 136 10*3/uL — AB (ref 140–400)
RBC: 4.13 MIL/uL — AB (ref 4.20–5.82)
RDW: 17.6 % — ABNORMAL HIGH (ref 11.0–14.6)
WBC: 5 10*3/uL (ref 4.0–10.3)

## 2017-10-04 LAB — MAGNESIUM: Magnesium: 1.5 mg/dL — ABNORMAL LOW (ref 1.7–2.4)

## 2017-10-04 MED ORDER — ONDANSETRON 4 MG PO TBDP: 4.0000 mg | ORAL_TABLET | Freq: Three times a day (TID) | ORAL | 0 refills | Status: DC | PRN

## 2017-10-04 NOTE — Progress Notes (Signed)
[facility] OFFICE PROGRESS NOTE   Diagnosis:  CML  INTERVAL HISTORY:   Mr. Grzelak returns for follow-up.  He was unable to tolerate dasatinib due to diarrhea and nausea.  He discontinued dasatinib and resumed Gleevec.  He notes the diarrhea is "under control" since resuming Gleevec.  The nausea is also better.  He takes Zofran as needed.  No rash.  Objective:  Vital signs in last 24 hours:  Blood pressure (!) 156/94, pulse (!) 57, temperature 98.2 F (36.8 C), temperature source Oral, resp. rate 18, height 5\' 9"  (1.753 m), weight 147 lb 11.2 oz (67 kg), SpO2 100 %.    HEENT: No thrush or ulcers. Resp: Lungs clear bilaterally. Cardio: Regular rate and rhythm. GI: Abdomen soft and nontender.  No splenomegaly. Vascular: No left leg edema.  Skin: No rash.   Lab Results:  Lab Results  Component Value Date   WBC 4.2 09/04/2017   HGB 10.6 (L) 09/04/2017   HCT 33.4 (L) 09/04/2017   MCV 78.7 (L) 09/04/2017   PLT 137 (L) 09/04/2017   NEUTROABS 2.3 09/04/2017    Imaging:  No results found.  Medications: I have reviewed the patient's current medications.  Assessment/Plan: 1. Chronic myelogenous leukemia, diagnosed in January of 2009. He remains in hematologic remission. He is taking Gleevec at a dose of 200 mg daily. The peripheral blood PCR was markedly increased in May 2013, likely reflecting medical noncompliance. The peripheral blood PCRwas stable on 05/11/2017.  Trial of dasatinib April/May 2019; discontinued due to poor tolerance with diarrhea and nausea  Gleevec resumed May 2019 2. 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 3. Status post C3-C4, C4-C5, anterior cervical diskectomy and fusion with allograft and plating 09/30/2010. 4. 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 5. Depression 6. Diarrhea, ? related to chronic pancreatitis versus Gleevec. He takes Lomotil as needed.   7. Indurated facial skin lesion 11/20/2007 with a history of MRSA skin infection of the submental area in May 2008. The induration resolved with doxycycline. 8. Left knee arthroscopy 05/25/2007. 9. Postoperative left knee effusion/pain, likely related to gout. 10. History of gout.  11. Chronic pancreatitis. 12. Status post right above-the-knee amputation. 13. MRSA infection of the submental area May 2008. 14. History of tobacco, alcohol, and cocaine use. 15. History of coronary artery disease. 16. "Shotty" lymphadenopathy of the neck, axilla, and left groin in 2009.  17. History of a microcytic anemia.  18. History of a right olecranon bursa lesion, ? gouty tophus. 19. 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 He previously took AndroGel. 20. History of anemia secondary to chronic disease and Gleevec therapy.  21. history of anorexia-potentially related to [place]. He reports Medicaid would not pay for Megace.  22. Chronic left knee and left foot pain.  23. Status post removal of a left knee screw 07/03/2012. 24. History of hypokalemia. Likely related to diarrhea.He is on a potassium supplement. 25. Status post left foot surgery. 26. Pulsatile fullness right neck. Evaluated by vascular surgery status post CT angiogram 03/07/2014 with no carotid artery aneurysm identified. 27. Cough-abnormal lung exam 05/12/2015. He completed a course of Levaquin. Chest xray negative. 28. Hospitalization 09/20/2016 through 10/07/2016 with unresponsiveness/acute metabolic encephalopathy   Disposition: [name] appears unchanged.  He tolerated dasatinib poorly with diarrhea and nausea.  He has resumed Gleevec and seems to be tolerating this well overall.  Plan to continue the same.  We are obtaining a CBC and  chemistry panel today.  He will return for lab and follow-up in 1 month.  He will contact the office in the interim with any problems.    [name] ANP/GNP-BC   10/04/2017  3:21  PM

## 2017-10-04 NOTE — Telephone Encounter (Signed)
Printed avs and calender of upcoming appointment. Per 5/29 los 

## 2017-10-05 ENCOUNTER — Encounter (HOSPITAL_COMMUNITY): Payer: Self-pay | Admitting: Emergency Medicine

## 2017-10-05 ENCOUNTER — Emergency Department (HOSPITAL_COMMUNITY)
Admission: EM | Admit: 2017-10-05 | Discharge: 2017-10-05 | Disposition: A | Discharge status: Home / Self Care | Payer: Medicare Other | Attending: Emergency Medicine | Admitting: Emergency Medicine

## 2017-10-05 ENCOUNTER — Emergency Department (HOSPITAL_COMMUNITY): Payer: Medicare Other

## 2017-10-05 ENCOUNTER — Other Ambulatory Visit: Payer: Self-pay

## 2017-10-05 DIAGNOSIS — R079 Chest pain, unspecified: Secondary | ICD-10-CM | POA: Diagnosis present

## 2017-10-05 DIAGNOSIS — R072 Precordial pain: Secondary | ICD-10-CM | POA: Diagnosis not present

## 2017-10-05 DIAGNOSIS — I252 Old myocardial infarction: Secondary | ICD-10-CM | POA: Diagnosis not present

## 2017-10-05 DIAGNOSIS — Z79899 Other long term (current) drug therapy: Secondary | ICD-10-CM | POA: Diagnosis not present

## 2017-10-05 DIAGNOSIS — R0789 Other chest pain: Secondary | ICD-10-CM

## 2017-10-05 DIAGNOSIS — F17210 Nicotine dependence, cigarettes, uncomplicated: Secondary | ICD-10-CM | POA: Diagnosis not present

## 2017-10-05 DIAGNOSIS — I10 Essential (primary) hypertension: Secondary | ICD-10-CM | POA: Insufficient documentation

## 2017-10-05 LAB — URINALYSIS, ROUTINE W REFLEX MICROSCOPIC
Bacteria, UA: NONE SEEN
Bilirubin Urine: NEGATIVE
GLUCOSE, UA: NEGATIVE mg/dL
Hgb urine dipstick: NEGATIVE
Ketones, ur: NEGATIVE mg/dL
Leukocytes, UA: NEGATIVE
Nitrite: NEGATIVE
PH: 5 (ref 5.0–8.0)
Protein, ur: 100 mg/dL — AB
Specific Gravity, Urine: 1.014 (ref 1.005–1.030)

## 2017-10-05 LAB — CBC WITH DIFFERENTIAL/PLATELET
ABS IMMATURE GRANULOCYTES: 0 10*3/uL (ref 0.0–0.1)
BASOS PCT: 1 %
Basophils Absolute: 0 10*3/uL (ref 0.0–0.1)
EOS ABS: 0.3 10*3/uL (ref 0.0–0.7)
EOS PCT: 8 %
HCT: 32.8 % — ABNORMAL LOW (ref 39.0–52.0)
Hemoglobin: 10.5 g/dL — ABNORMAL LOW (ref 13.0–17.0)
Immature Granulocytes: 0 %
Lymphocytes Relative: 27 %
Lymphs Abs: 1.2 10*3/uL (ref 0.7–4.0)
MCH: 24.8 pg — AB (ref 26.0–34.0)
MCHC: 32 g/dL (ref 30.0–36.0)
MCV: 77.5 fL — AB (ref 78.0–100.0)
MONO ABS: 0.4 10*3/uL (ref 0.1–1.0)
Monocytes Relative: 9 %
Neutro Abs: 2.3 10*3/uL (ref 1.7–7.7)
Neutrophils Relative %: 55 %
PLATELETS: 142 10*3/uL — AB (ref 150–400)
RBC: 4.23 MIL/uL (ref 4.22–5.81)
RDW: 17.5 % — AB (ref 11.5–15.5)
WBC: 4.2 10*3/uL (ref 4.0–10.5)

## 2017-10-05 LAB — COMPREHENSIVE METABOLIC PANEL
ALT: 40 U/L (ref 17–63)
ANION GAP: 7 (ref 5–15)
AST: 47 U/L — ABNORMAL HIGH (ref 15–41)
Albumin: 2.8 g/dL — ABNORMAL LOW (ref 3.5–5.0)
Alkaline Phosphatase: 130 U/L — ABNORMAL HIGH (ref 38–126)
BUN: 17 mg/dL (ref 6–20)
CALCIUM: 8.4 mg/dL — AB (ref 8.9–10.3)
CO2: 26 mmol/L (ref 22–32)
CREATININE: 1.81 mg/dL — AB (ref 0.61–1.24)
Chloride: 111 mmol/L (ref 101–111)
GFR, EST AFRICAN AMERICAN: 41 mL/min — AB (ref >60)
GFR, EST NON AFRICAN AMERICAN: 36 mL/min — AB (ref >60)
Glucose, Bld: 101 mg/dL — ABNORMAL HIGH (ref 65–99)
Potassium: 3.3 mmol/L — ABNORMAL LOW (ref 3.5–5.1)
Sodium: 144 mmol/L (ref 135–145)
Total Bilirubin: 0.5 mg/dL (ref 0.3–1.2)
Total Protein: 4.9 g/dL — ABNORMAL LOW (ref 6.5–8.1)

## 2017-10-05 LAB — LIPASE, BLOOD: LIPASE: 137 U/L — AB (ref 11–51)

## 2017-10-05 LAB — I-STAT CG4 LACTIC ACID, ED: LACTIC ACID, VENOUS: 0.65 mmol/L (ref 0.5–1.9)

## 2017-10-05 LAB — I-STAT TROPONIN, ED
Troponin i, poc: 0.01 ng/mL (ref 0.00–0.08)
Troponin i, poc: 0.01 ng/mL (ref 0.00–0.08)

## 2017-10-05 MED ORDER — ONDANSETRON HCL 4 MG/2ML IJ SOLN
4.0000 mg | Freq: Once | INTRAMUSCULAR | Status: AC
  Administered 2017-10-05: 4 mg via INTRAVENOUS
  Filled 2017-10-05: qty 2

## 2017-10-05 MED ORDER — MAGNESIUM SULFATE 2 GM/50ML IV SOLN
2.0000 g | Freq: Once | INTRAVENOUS | Status: AC
  Administered 2017-10-05: 2 g via INTRAVENOUS
  Filled 2017-10-05: qty 50

## 2017-10-05 MED ORDER — POTASSIUM CHLORIDE CRYS ER 20 MEQ PO TBCR
60.0000 meq | EXTENDED_RELEASE_TABLET | Freq: Once | ORAL | Status: AC
  Administered 2017-10-05: 60 meq via ORAL
  Filled 2017-10-05: qty 3

## 2017-10-05 NOTE — ED Notes (Signed)
Pt aware we need urine for a specimen.
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
## 2017-10-05 NOTE — ED Notes (Signed)
Got patient undressed on the monitor did ekg shown to er doctor patient is resting with call bell in reach and gave patient some warm blankets

## 2017-10-05 NOTE — Discharge Instructions (Addendum)
Increase hydralazine to 100 mg three times a day.  And start baby asprin daily.  Cardiology has planned to make a follow up appointment for you.   Please follow up about your increasing pancreatic enzymes.   Please return with any concerns.
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
## 2017-10-05 NOTE — ED Provider Notes (Signed)
Patient is a 72 year old male.  Patient was signed out to me to follow-up with cardiology.  Cardiology felt like patient was safe to go home.  They recommended to medication changes which have been stated in the discharge paperwork.  They schedule outpatient follow-up patient.  I went to interview patient he appears to be feeling well. Reassuring vitals.  I noted that patient's pancreatic enzymes are increasing.  He reports he has two physicians that he follows up with [place] about this, he reports no vomiting, feels fine and would like to go home.  Patient asking to be discharged at this time.   Macarthur Critchley, MD 10/05/17 1655

## 2017-10-05 NOTE — ED Notes (Signed)
ED Provider at bedside. 
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
## 2017-10-13 ENCOUNTER — Other Ambulatory Visit: Payer: Self-pay | Admitting: Emergency Medicine

## 2017-10-13 ENCOUNTER — Telehealth: Payer: Self-pay | Admitting: Emergency Medicine

## 2017-10-13 DIAGNOSIS — C9210 Chronic myeloid leukemia, BCR/ABL-positive, not having achieved remission: Secondary | ICD-10-CM

## 2017-10-13 MED ORDER — DIPHENOXYLATE-ATROPINE 2.5-0.025 MG PO TABS: ORAL_TABLET | ORAL | 0 refills | Status: DC

## 2017-10-13 NOTE — Telephone Encounter (Signed)
PT states he does take potassium and magnesium supplements. C/o diarrhea, rx for lomotil sent to pts pharmacy.

## 2017-10-16 ENCOUNTER — Ambulatory Visit: Payer: Self-pay | Admitting: Cardiology

## 2017-10-24 ENCOUNTER — Telehealth: Payer: Self-pay

## 2017-10-24 DIAGNOSIS — C9210 Chronic myeloid leukemia, BCR/ABL-positive, not having achieved remission: Secondary | ICD-10-CM

## 2017-10-24 MED ORDER — ONDANSETRON 4 MG PO TBDP: 4.0000 mg | ORAL_TABLET | Freq: Three times a day (TID) | ORAL | 0 refills | Status: DC | PRN

## 2017-10-24 MED ORDER — DIPHENOXYLATE-ATROPINE 2.5-0.025 MG PO TABS: ORAL_TABLET | ORAL | 0 refills | Status: DC

## 2017-10-24 NOTE — Telephone Encounter (Signed)
Called to inform pt that lomotil and zofran prescriptions have been refilled to pharmacy. Pt voiced understanding

## 2017-11-02 ENCOUNTER — Telehealth: Payer: Self-pay | Admitting: Nurse Practitioner

## 2017-11-02 ENCOUNTER — Encounter: Payer: Self-pay | Admitting: Nurse Practitioner

## 2017-11-02 ENCOUNTER — Inpatient Hospital Stay: Payer: Medicare Other

## 2017-11-02 ENCOUNTER — Inpatient Hospital Stay: Payer: Medicare Other | Attending: Nurse Practitioner | Admitting: Nurse Practitioner

## 2017-11-02 VITALS — BP 149/96 | HR 62 | Temp 98.3°F | Resp 17 | Ht 69.0 in | Wt 148.3 lb

## 2017-11-02 DIAGNOSIS — C9210 Chronic myeloid leukemia, BCR/ABL-positive, not having achieved remission: Secondary | ICD-10-CM | POA: Diagnosis present

## 2017-11-02 DIAGNOSIS — R630 Anorexia: Secondary | ICD-10-CM | POA: Insufficient documentation

## 2017-11-02 DIAGNOSIS — Z8614 Personal history of Methicillin resistant Staphylococcus aureus infection: Secondary | ICD-10-CM | POA: Diagnosis not present

## 2017-11-02 DIAGNOSIS — R109 Unspecified abdominal pain: Secondary | ICD-10-CM | POA: Diagnosis not present

## 2017-11-02 DIAGNOSIS — I2510 Atherosclerotic heart disease of native coronary artery without angina pectoris: Secondary | ICD-10-CM | POA: Diagnosis not present

## 2017-11-02 DIAGNOSIS — R110 Nausea: Secondary | ICD-10-CM | POA: Insufficient documentation

## 2017-11-02 DIAGNOSIS — Z79899 Other long term (current) drug therapy: Secondary | ICD-10-CM | POA: Insufficient documentation

## 2017-11-02 DIAGNOSIS — R197 Diarrhea, unspecified: Secondary | ICD-10-CM | POA: Insufficient documentation

## 2017-11-02 LAB — CBC WITH DIFFERENTIAL (CANCER CENTER ONLY)
Basophils Absolute: 0 10*3/uL (ref 0.0–0.1)
Basophils Relative: 1 %
EOS ABS: 0.2 10*3/uL (ref 0.0–0.5)
EOS PCT: 6 %
HCT: 33.5 % — ABNORMAL LOW (ref 38.4–49.9)
HEMOGLOBIN: 10.8 g/dL — AB (ref 13.0–17.1)
LYMPHS ABS: 1 10*3/uL (ref 0.9–3.3)
LYMPHS PCT: 24 %
MCH: 24.9 pg — ABNORMAL LOW (ref 27.2–33.4)
MCHC: 32.4 g/dL (ref 32.0–36.0)
MCV: 76.9 fL — AB (ref 79.3–98.0)
MONOS PCT: 10 %
Monocytes Absolute: 0.4 10*3/uL (ref 0.1–0.9)
Neutro Abs: 2.5 10*3/uL (ref 1.5–6.5)
Neutrophils Relative %: 59 %
PLATELETS: 139 10*3/uL — AB (ref 140–400)
RBC: 4.36 MIL/uL (ref 4.20–5.82)
RDW: 20.3 % — ABNORMAL HIGH (ref 11.0–14.6)
WBC: 4.1 10*3/uL (ref 4.0–10.3)

## 2017-11-02 LAB — CMP (CANCER CENTER ONLY)
ALK PHOS: 160 U/L — AB (ref 38–126)
ALT: 95 U/L — AB (ref 0–44)
AST: 149 U/L — ABNORMAL HIGH (ref 15–41)
Albumin: 2.9 g/dL — ABNORMAL LOW (ref 3.5–5.0)
Anion gap: 5 (ref 5–15)
BUN: 17 mg/dL (ref 8–23)
CALCIUM: 8.2 mg/dL — AB (ref 8.9–10.3)
CHLORIDE: 110 mmol/L (ref 98–111)
CO2: 26 mmol/L (ref 22–32)
CREATININE: 1.48 mg/dL — AB (ref 0.61–1.24)
GFR, EST AFRICAN AMERICAN: 53 mL/min — AB (ref >60)
GFR, EST NON AFRICAN AMERICAN: 46 mL/min — AB (ref >60)
Glucose, Bld: 88 mg/dL (ref 70–99)
Potassium: 4 mmol/L (ref 3.5–5.1)
Sodium: 141 mmol/L (ref 135–145)
Total Bilirubin: 0.4 mg/dL (ref 0.3–1.2)
Total Protein: 4.9 g/dL — ABNORMAL LOW (ref 6.5–8.1)

## 2017-11-02 NOTE — Telephone Encounter (Signed)
Scheduled appt per 6/27 los - gave patient AVS and calender per los.  

## 2017-11-02 NOTE — Progress Notes (Addendum)
[facility] OFFICE PROGRESS NOTE   Diagnosis: CML  INTERVAL HISTORY:   [name] returns for follow-up.  He continues Gleevec 200 mg daily.  He reports intermittent abdominal pain, nausea and diarrhea.  Appetite is poor.  Objective:  Vital signs in last 24 hours:  Blood pressure (!) 149/96, pulse 62, temperature 98.3 F (36.8 C), temperature source Oral, resp. rate 17, height 5\' 9"  (1.753 m), weight 148 lb 4.8 oz (67.3 kg), SpO2 100 %.    HEENT: No thrush or ulcers. Resp: Lungs clear bilaterally. Cardio: Regular rate and rhythm. GI: Abdomen soft, generalized tenderness.  No hepatosplenomegaly. Vascular: No left leg edema.   Lab Results:  Lab Results  Component Value Date   WBC 4.1 11/02/2017   HGB 10.8 (L) 11/02/2017   HCT 33.5 (L) 11/02/2017   MCV 76.9 (L) 11/02/2017   PLT 139 (L) 11/02/2017   NEUTROABS 2.5 11/02/2017    Imaging:  No results found.  Medications: I have reviewed the patient's current medications.  Assessment/Plan: 1. Chronic myelogenous leukemia, diagnosed in January of 2009. He remains in hematologic remission. He is taking Gleevec at a dose of 200 mg daily. The peripheral blood PCR was markedly increased in May 2013, likely reflecting medical noncompliance. The peripheral blood PCRwas stable on 05/11/2017.  Trial of dasatinib April/May 2019; discontinued due to poor tolerance with diarrhea and nausea  Gleevec resumed May 2019 2. 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 3. Status post C3-C4, C4-C5, anterior cervical diskectomy and fusion with allograft and plating 09/30/2010. 4. 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 5. Depression 6. Diarrhea, ? related to chronic pancreatitis versus Gleevec. He takes Lomotil as needed.  7. Indurated facial skin lesion 11/20/2007 with a history of MRSA skin infection of the submental area in May 2008. The induration  resolved with doxycycline. 8. Left knee arthroscopy 05/25/2007. 9. Postoperative left knee effusion/pain, likely related to gout. 10. History of gout.  11. Chronic pancreatitis. 12. Status post right above-the-knee amputation. 13. MRSA infection of the submental area May 2008. 14. History of tobacco, alcohol, and cocaine use. 15. History of coronary artery disease. 16. "Shotty" lymphadenopathy of the neck, axilla, and left groin in 2009.  17. History of a microcytic anemia.  18. History of a right olecranon bursa lesion, ? gouty tophus. 19. 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 He previously took AndroGel. 20. History of anemia secondary to chronic disease and Gleevec therapy.  21. history of anorexia-potentially related to [place]. He reports Medicaid would not pay for Megace.  22. Chronic left knee and left foot pain.  23. Status post removal of a left knee screw 07/03/2012. 24. History of hypokalemia. Likely related to diarrhea.He is on a potassium supplement. 25. Status post left foot surgery. 26. Pulsatile fullness right neck. Evaluated by vascular surgery status post CT angiogram 03/07/2014 with no carotid artery aneurysm identified. 27. Cough-abnormal lung exam 05/12/2015. He completed a course of Levaquin. Chest xray negative. 28. Hospitalization 09/20/2016 through 10/07/2016 with unresponsiveness/acute metabolic encephalopathy     Disposition: [name] appears unchanged.  He is currently on Gleevec 200 mg daily.  He would like to change to a different medication due to the nausea and diarrhea.  He was unable to tolerate dasatinib.  We discussed Nilotinib but decided against this due to potential medication interactions as well as the potential for pancreatitis.  He agrees to try dasatinib again at a lower dose.  We reviewed the chemistry panel from today.  The liver enzymes are elevated.  We will repeat in 1 week.  He will return for lab and follow-up on 11/10/2017 with the  plan to begin a trial of dasatinib at a lower dose if he is otherwise doing well.  Patient seen with Dr. [name].   [name] ANP/GNP-BC   11/02/2017  3:28 PM  This was a shared visit with [name].  [name] continues to have diarrhea.  He relates the diarrhea to Gleevec and dasatinib.  Gleevec will be discontinued.  He agrees to a repeat trial of dasatinib at a reduced dose.  He not appear to be a candidate for nilotinib secondary to drug interactions and risk of pancreatitis.  Julieanne Manson, MD

## 2017-11-05 ENCOUNTER — Other Ambulatory Visit: Payer: Self-pay

## 2017-11-05 ENCOUNTER — Emergency Department (HOSPITAL_COMMUNITY)
Admission: EM | Admit: 2017-11-05 | Discharge: 2017-11-05 | Disposition: A | Discharge status: Home / Self Care | Payer: Medicare Other | Attending: Emergency Medicine | Admitting: Emergency Medicine

## 2017-11-05 ENCOUNTER — Encounter (HOSPITAL_COMMUNITY): Payer: Self-pay | Admitting: Emergency Medicine

## 2017-11-05 DIAGNOSIS — F17210 Nicotine dependence, cigarettes, uncomplicated: Secondary | ICD-10-CM | POA: Diagnosis not present

## 2017-11-05 DIAGNOSIS — Z79899 Other long term (current) drug therapy: Secondary | ICD-10-CM | POA: Diagnosis not present

## 2017-11-05 DIAGNOSIS — I252 Old myocardial infarction: Secondary | ICD-10-CM | POA: Diagnosis not present

## 2017-11-05 DIAGNOSIS — R197 Diarrhea, unspecified: Secondary | ICD-10-CM | POA: Diagnosis not present

## 2017-11-05 DIAGNOSIS — I10 Essential (primary) hypertension: Secondary | ICD-10-CM | POA: Insufficient documentation

## 2017-11-05 DIAGNOSIS — K529 Noninfective gastroenteritis and colitis, unspecified: Secondary | ICD-10-CM

## 2017-11-05 LAB — URINALYSIS, ROUTINE W REFLEX MICROSCOPIC
BACTERIA UA: NONE SEEN
Bilirubin Urine: NEGATIVE
Glucose, UA: NEGATIVE mg/dL
Ketones, ur: NEGATIVE mg/dL
Leukocytes, UA: NEGATIVE
NITRITE: NEGATIVE
SPECIFIC GRAVITY, URINE: 1.012 (ref 1.005–1.030)
pH: 6 (ref 5.0–8.0)

## 2017-11-05 LAB — CBC WITH DIFFERENTIAL/PLATELET
BASOS PCT: 0 %
Basophils Absolute: 0 10*3/uL (ref 0.0–0.1)
EOS ABS: 0.3 10*3/uL (ref 0.0–0.7)
Eosinophils Relative: 7 %
HCT: 34.3 % — ABNORMAL LOW (ref 39.0–52.0)
HEMOGLOBIN: 11.3 g/dL — AB (ref 13.0–17.0)
Lymphocytes Relative: 20 %
Lymphs Abs: 0.9 10*3/uL (ref 0.7–4.0)
MCH: 25.3 pg — ABNORMAL LOW (ref 26.0–34.0)
MCHC: 32.9 g/dL (ref 30.0–36.0)
MCV: 76.9 fL — ABNORMAL LOW (ref 78.0–100.0)
Monocytes Absolute: 0.4 10*3/uL (ref 0.1–1.0)
Monocytes Relative: 9 %
Neutro Abs: 2.8 10*3/uL (ref 1.7–7.7)
Neutrophils Relative %: 64 %
PLATELETS: 114 10*3/uL — AB (ref 150–400)
RBC: 4.46 MIL/uL (ref 4.22–5.81)
RDW: 19.6 % — ABNORMAL HIGH (ref 11.5–15.5)
WBC: 4.3 10*3/uL (ref 4.0–10.5)

## 2017-11-05 LAB — COMPREHENSIVE METABOLIC PANEL
ALBUMIN: 2.5 g/dL — AB (ref 3.5–5.0)
ALK PHOS: 133 U/L — AB (ref 38–126)
ALT: 138 U/L — AB (ref 0–44)
AST: 228 U/L — ABNORMAL HIGH (ref 15–41)
Anion gap: 4 — ABNORMAL LOW (ref 5–15)
BUN: 17 mg/dL (ref 8–23)
CALCIUM: 8 mg/dL — AB (ref 8.9–10.3)
CHLORIDE: 112 mmol/L — AB (ref 98–111)
CO2: 26 mmol/L (ref 22–32)
CREATININE: 1.38 mg/dL — AB (ref 0.61–1.24)
GFR calc Af Amer: 57 mL/min — ABNORMAL LOW (ref >60)
GFR calc non Af Amer: 50 mL/min — ABNORMAL LOW (ref >60)
GLUCOSE: 110 mg/dL — AB (ref 70–99)
Potassium: 3.4 mmol/L — ABNORMAL LOW (ref 3.5–5.1)
SODIUM: 142 mmol/L (ref 135–145)
Total Bilirubin: 0.4 mg/dL (ref 0.3–1.2)
Total Protein: 4.5 g/dL — ABNORMAL LOW (ref 6.5–8.1)

## 2017-11-05 LAB — LIPASE, BLOOD: Lipase: 44 U/L (ref 11–51)

## 2017-11-05 MED ORDER — MORPHINE SULFATE (PF) 4 MG/ML IV SOLN
6.0000 mg | Freq: Once | INTRAVENOUS | Status: AC
  Administered 2017-11-05: 6 mg via INTRAVENOUS
  Filled 2017-11-05: qty 2

## 2017-11-05 MED ORDER — SODIUM CHLORIDE 0.9 % IV BOLUS
1000.0000 mL | Freq: Once | INTRAVENOUS | Status: AC
  Administered 2017-11-05: 1000 mL via INTRAVENOUS
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
## 2017-11-05 NOTE — ED Notes (Signed)
Patient given water and saltines. 

## 2017-11-05 NOTE — ED Notes (Signed)
Bed: QQ24 Expected date:  Expected time:  Means of arrival:  Comments: Ca pt

## 2017-11-05 NOTE — Discharge Instructions (Signed)
You were seen in the emergency department for worsening diarrhea.  Your lab work was normal today.  The cause of your diarrhea is unknown although may be related to the medications to take for cancer.  You had a normal CT scan 2 months ago in the Er.  Stay well-hydrated.  Drink plenty of fluids.  Use Zofran as needed for nausea.  Avoid dairy products and fruits as these can worsen diarrhea.  Take Imodium for diarrhea as needed.  Call your oncologist in the next 48 to 72 hours if symptoms are not improving.  You may need need referral to gastroenterology.  Return to the ER for fevers, chills, worsening abdominal pain, blood in your stool.

## 2017-11-05 NOTE — ED Triage Notes (Signed)
Patient from home with abd pain x 3days with N/V, unable to take meds. Hx of HTN and leukemia. Tender on palpation.

## 2017-11-05 NOTE — ED Notes (Signed)
Pt has urinal and bedside

## 2017-11-05 NOTE — ED Notes (Signed)
Pt reminded of need for urine specimen 

## 2017-11-10 ENCOUNTER — Encounter: Payer: Self-pay | Admitting: Nurse Practitioner

## 2017-11-10 ENCOUNTER — Inpatient Hospital Stay: Payer: Medicare Other | Attending: Nurse Practitioner | Admitting: Nurse Practitioner

## 2017-11-10 ENCOUNTER — Inpatient Hospital Stay: Payer: Medicare Other

## 2017-11-10 VITALS — BP 170/88 | HR 68 | Temp 98.4°F | Resp 18 | Ht 69.0 in | Wt 146.8 lb

## 2017-11-10 DIAGNOSIS — R634 Abnormal weight loss: Secondary | ICD-10-CM | POA: Diagnosis not present

## 2017-11-10 DIAGNOSIS — R748 Abnormal levels of other serum enzymes: Secondary | ICD-10-CM | POA: Insufficient documentation

## 2017-11-10 DIAGNOSIS — Z8614 Personal history of Methicillin resistant Staphylococcus aureus infection: Secondary | ICD-10-CM | POA: Insufficient documentation

## 2017-11-10 DIAGNOSIS — R110 Nausea: Secondary | ICD-10-CM | POA: Insufficient documentation

## 2017-11-10 DIAGNOSIS — D509 Iron deficiency anemia, unspecified: Secondary | ICD-10-CM | POA: Insufficient documentation

## 2017-11-10 DIAGNOSIS — Z79899 Other long term (current) drug therapy: Secondary | ICD-10-CM | POA: Insufficient documentation

## 2017-11-10 DIAGNOSIS — R1032 Left lower quadrant pain: Secondary | ICD-10-CM | POA: Insufficient documentation

## 2017-11-10 DIAGNOSIS — E876 Hypokalemia: Secondary | ICD-10-CM | POA: Insufficient documentation

## 2017-11-10 DIAGNOSIS — Z87891 Personal history of nicotine dependence: Secondary | ICD-10-CM | POA: Diagnosis not present

## 2017-11-10 DIAGNOSIS — C9210 Chronic myeloid leukemia, BCR/ABL-positive, not having achieved remission: Secondary | ICD-10-CM | POA: Diagnosis present

## 2017-11-10 DIAGNOSIS — R197 Diarrhea, unspecified: Secondary | ICD-10-CM | POA: Insufficient documentation

## 2017-11-10 DIAGNOSIS — I2510 Atherosclerotic heart disease of native coronary artery without angina pectoris: Secondary | ICD-10-CM | POA: Insufficient documentation

## 2017-11-10 LAB — CMP (CANCER CENTER ONLY)
ALT: 132 U/L — ABNORMAL HIGH (ref 0–44)
AST: 156 U/L — ABNORMAL HIGH (ref 15–41)
Albumin: 2.6 g/dL — ABNORMAL LOW (ref 3.5–5.0)
Alkaline Phosphatase: 156 U/L — ABNORMAL HIGH (ref 38–126)
Anion gap: 8 (ref 5–15)
BUN: 14 mg/dL (ref 8–23)
CHLORIDE: 109 mmol/L (ref 98–111)
CO2: 25 mmol/L (ref 22–32)
Calcium: 8.4 mg/dL — ABNORMAL LOW (ref 8.9–10.3)
Creatinine: 1.38 mg/dL — ABNORMAL HIGH (ref 0.61–1.24)
GFR, EST AFRICAN AMERICAN: 57 mL/min — AB (ref >60)
GFR, EST NON AFRICAN AMERICAN: 50 mL/min — AB (ref >60)
Glucose, Bld: 167 mg/dL — ABNORMAL HIGH (ref 70–99)
POTASSIUM: 3.4 mmol/L — AB (ref 3.5–5.1)
SODIUM: 142 mmol/L (ref 135–145)
Total Bilirubin: 0.4 mg/dL (ref 0.3–1.2)
Total Protein: 4.6 g/dL — ABNORMAL LOW (ref 6.5–8.1)

## 2017-11-10 LAB — CBC WITH DIFFERENTIAL (CANCER CENTER ONLY)
Basophils Absolute: 0 10*3/uL (ref 0.0–0.1)
Basophils Relative: 0 %
Eosinophils Absolute: 0.3 10*3/uL (ref 0.0–0.5)
Eosinophils Relative: 7 %
HEMATOCRIT: 32.5 % — AB (ref 38.4–49.9)
HEMOGLOBIN: 10.5 g/dL — AB (ref 13.0–17.1)
LYMPHS PCT: 17 %
Lymphs Abs: 0.8 10*3/uL — ABNORMAL LOW (ref 0.9–3.3)
MCH: 25 pg — AB (ref 27.2–33.4)
MCHC: 32.4 g/dL (ref 32.0–36.0)
MCV: 77.1 fL — AB (ref 79.3–98.0)
MONOS PCT: 7 %
Monocytes Absolute: 0.3 10*3/uL (ref 0.1–0.9)
NEUTROS ABS: 3.2 10*3/uL (ref 1.5–6.5)
NEUTROS PCT: 69 %
Platelet Count: 131 10*3/uL — ABNORMAL LOW (ref 140–400)
RBC: 4.22 MIL/uL (ref 4.20–5.82)
RDW: 21.4 % — ABNORMAL HIGH (ref 11.0–14.6)
WBC: 4.6 10*3/uL (ref 4.0–10.3)

## 2017-11-10 NOTE — Patient Instructions (Signed)
Stop Zocor

## 2017-11-10 NOTE — Progress Notes (Addendum)
[facility] OFFICE PROGRESS NOTE   Diagnosis: CML  INTERVAL HISTORY:   Mr. Schurman returns for follow-up.  Gleevec was placed on hold following office visit 11/02/2017 with plans for him to resume dasatinib at a lower dose if he is otherwise doing well.  He reports the diarrhea and nausea have improved coinciding with discontinuation of Gleevec.  He continues to have intermittent abdominal pain.  The pain is located at the left lower abdomen.  Objective:  Vital signs in last 24 hours:  Blood pressure (!) 170/88, pulse 68, temperature 98.4 F (36.9 C), temperature source Oral, resp. rate 18, height 5\' 9"  (1.753 m), weight 146 lb 12.8 oz (66.6 kg), SpO2 100 %.    HEENT: Mild white coating over tongue.  No buccal thrush. Resp: Lungs clear bilaterally. Cardio: Regular rate and rhythm. GI: Abdomen is soft.  No hepatomegaly.  Tender at the left lower abdomen. Vascular: No left leg edema. Neuro: Alert and oriented.    Lab Results:  Lab Results  Component Value Date   WBC 4.6 11/10/2017   HGB 10.5 (L) 11/10/2017   HCT 32.5 (L) 11/10/2017   MCV 77.1 (L) 11/10/2017   PLT 131 (L) 11/10/2017   NEUTROABS 3.2 11/10/2017    Imaging:  No results found.  Medications: I have reviewed the patient's current medications.  Assessment/Plan: 1. Chronic myelogenous leukemia, diagnosed in January of 2009. He remains in hematologic remission. He is taking Gleevec at a dose of 200 mg daily. The peripheral blood PCR was markedly increased in May 2013, likely reflecting medical noncompliance. The peripheral blood PCRwas stable on 05/11/2017.  Trial of dasatinib April/May 2019; discontinued due to poor tolerance with diarrhea and nausea  Gleevec resumed May 2019 2. 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 3. Status post C3-C4, C4-C5, anterior cervical diskectomy and fusion with allograft and plating 09/30/2010. 4. 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 5. Depression 6. Diarrhea, ? related to chronic pancreatitis versus Gleevec. He takes Lomotil as needed.  7. Indurated facial skin lesion 11/20/2007 with a history of MRSA skin infection of the submental area in May 2008. The induration resolved with doxycycline. 8. Left knee arthroscopy 05/25/2007. 9. Postoperative left knee effusion/pain, likely related to gout. 10. History of gout.  11. Chronic pancreatitis. 12. Status post right above-the-knee amputation. 13. MRSA infection of the submental area May 2008. 14. History of tobacco, alcohol, and cocaine use. 15. History of coronary artery disease. 16. "Shotty" lymphadenopathy of the neck, axilla, and left groin in 2009.  17. History of a microcytic anemia.  18. History of a right olecranon bursa lesion, ? gouty tophus. 19. 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 He previously took AndroGel. 20. History of anemia secondary to chronic disease and Gleevec therapy.  21. history of anorexia-potentially related to [place]. He reports Medicaid would not pay for Megace.  22. Chronic left knee and left foot pain.  23. Status post removal of a left knee screw 07/03/2012. 24. History of hypokalemia. Likely related to diarrhea.He is on a potassium supplement. 25. Status post left foot surgery. 26. Pulsatile fullness right neck. Evaluated by vascular surgery status post CT angiogram 03/07/2014 with no carotid artery aneurysm identified. 27. Cough-abnormal lung exam 05/12/2015. He completed a course of Levaquin. Chest xray negative. 28. Hospitalization 09/20/2016 through 10/07/2016 with unresponsiveness/acute metabolic encephalopathy     Disposition: Mr. Mander is a 72 year old man with CML.  We placed Gleevec on  hold following his last office visit due to nausea, diarrhea, abdominal pain and liver enzyme elevation.  The nausea and diarrhea are better.  He continues to have abdominal pain and liver  enzyme elevation, etiology unclear.  We are referring him to GI for evaluation.  He has seen Dr. Benson Norway in the past.  We instructed him to hold Zocor.  We will continue to hold [place].  He will return for lab and follow-up in 2 weeks.  Patient seen with Dr. [name].    [name] ANP/GNP-BC   11/10/2017  3:16 PM This was a shared visit with [name].  Mr. Gatt was interviewed and examined.  The liver enzymes remain elevated.  He denies recent alcohol use.  He will discontinue Zocor and we will make a referral to Dr. Benson Norway.  Treatment for CML remains on hold.  Julieanne Manson, MD

## 2017-11-13 ENCOUNTER — Telehealth: Payer: Self-pay | Admitting: Oncology

## 2017-11-13 NOTE — Telephone Encounter (Signed)
Faxed medical records to [facility] on 11/13/17, Release ID: [id number]

## 2017-11-22 LAB — BCR/ABL

## 2017-11-23 ENCOUNTER — Telehealth: Payer: Self-pay | Admitting: Pharmacist

## 2017-11-23 ENCOUNTER — Inpatient Hospital Stay (HOSPITAL_BASED_OUTPATIENT_CLINIC_OR_DEPARTMENT_OTHER): Payer: Medicare Other | Admitting: Nurse Practitioner

## 2017-11-23 ENCOUNTER — Telehealth: Payer: Self-pay | Admitting: Emergency Medicine

## 2017-11-23 ENCOUNTER — Encounter: Payer: Self-pay | Admitting: Nurse Practitioner

## 2017-11-23 ENCOUNTER — Inpatient Hospital Stay: Payer: Medicare Other

## 2017-11-23 VITALS — BP 188/100 | HR 68 | Temp 98.2°F | Resp 18 | Ht 69.0 in | Wt 142.8 lb

## 2017-11-23 DIAGNOSIS — E876 Hypokalemia: Secondary | ICD-10-CM

## 2017-11-23 DIAGNOSIS — C9210 Chronic myeloid leukemia, BCR/ABL-positive, not having achieved remission: Secondary | ICD-10-CM

## 2017-11-23 DIAGNOSIS — Z8614 Personal history of Methicillin resistant Staphylococcus aureus infection: Secondary | ICD-10-CM

## 2017-11-23 DIAGNOSIS — R748 Abnormal levels of other serum enzymes: Secondary | ICD-10-CM

## 2017-11-23 DIAGNOSIS — Z79899 Other long term (current) drug therapy: Secondary | ICD-10-CM

## 2017-11-23 DIAGNOSIS — D509 Iron deficiency anemia, unspecified: Secondary | ICD-10-CM | POA: Diagnosis not present

## 2017-11-23 DIAGNOSIS — I2510 Atherosclerotic heart disease of native coronary artery without angina pectoris: Secondary | ICD-10-CM

## 2017-11-23 DIAGNOSIS — Z87891 Personal history of nicotine dependence: Secondary | ICD-10-CM

## 2017-11-23 DIAGNOSIS — R110 Nausea: Secondary | ICD-10-CM

## 2017-11-23 DIAGNOSIS — R634 Abnormal weight loss: Secondary | ICD-10-CM

## 2017-11-23 DIAGNOSIS — R197 Diarrhea, unspecified: Secondary | ICD-10-CM

## 2017-11-23 DIAGNOSIS — R1032 Left lower quadrant pain: Secondary | ICD-10-CM

## 2017-11-23 LAB — CMP (CANCER CENTER ONLY)
ALBUMIN: 2.3 g/dL — AB (ref 3.5–5.0)
ALK PHOS: 150 U/L — AB (ref 38–126)
ALT: 31 U/L (ref 0–44)
AST: 31 U/L (ref 15–41)
Anion gap: 8 (ref 5–15)
BUN: 20 mg/dL (ref 8–23)
CALCIUM: 8.1 mg/dL — AB (ref 8.9–10.3)
CO2: 28 mmol/L (ref 22–32)
CREATININE: 1.81 mg/dL — AB (ref 0.61–1.24)
Chloride: 108 mmol/L (ref 98–111)
GFR, Est AFR Am: 41 mL/min — ABNORMAL LOW (ref >60)
GFR, Estimated: 36 mL/min — ABNORMAL LOW (ref >60)
GLUCOSE: 129 mg/dL — AB (ref 70–99)
Potassium: 3.2 mmol/L — ABNORMAL LOW (ref 3.5–5.1)
SODIUM: 144 mmol/L (ref 135–145)
Total Bilirubin: <0.2 mg/dL — ABNORMAL LOW (ref 0.3–1.2)
Total Protein: 4.7 g/dL — ABNORMAL LOW (ref 6.5–8.1)

## 2017-11-23 LAB — CBC WITH DIFFERENTIAL (CANCER CENTER ONLY)
BASOS PCT: 1 %
Basophils Absolute: 0 10*3/uL (ref 0.0–0.1)
EOS ABS: 0.3 10*3/uL (ref 0.0–0.5)
Eosinophils Relative: 5 %
HCT: 32.9 % — ABNORMAL LOW (ref 38.4–49.9)
Hemoglobin: 10.5 g/dL — ABNORMAL LOW (ref 13.0–17.1)
Lymphocytes Relative: 22 %
Lymphs Abs: 1.4 10*3/uL (ref 0.9–3.3)
MCH: 24.7 pg — ABNORMAL LOW (ref 27.2–33.4)
MCHC: 31.9 g/dL — AB (ref 32.0–36.0)
MCV: 77.4 fL — ABNORMAL LOW (ref 79.3–98.0)
MONO ABS: 0.2 10*3/uL (ref 0.1–0.9)
MONOS PCT: 4 %
Neutro Abs: 4.3 10*3/uL (ref 1.5–6.5)
Neutrophils Relative %: 68 %
PLATELETS: 127 10*3/uL — AB (ref 140–400)
RBC: 4.25 MIL/uL (ref 4.20–5.82)
RDW: 20.3 % — AB (ref 11.0–14.6)
WBC Count: 6.3 10*3/uL (ref 4.0–10.3)

## 2017-11-23 NOTE — Progress Notes (Signed)
[facility] OFFICE PROGRESS NOTE   Diagnosis: CML  INTERVAL HISTORY:   [name] returns as scheduled.  Treatment for CML is on hold.  He underwent an upper endoscopy on 11/21/2017.  Findings include normal esophagus; erythematous mucosa in the antrum which was biopsied; normal examined duodenum.  He overall is feeling better.  Nausea and diarrhea have improved significantly.  Abdominal pain is now intermittent.  Appetite varies.  He has lost some weight.  He reports he has been out of his blood pressure medication for several weeks now.  Objective:  Vital signs in last 24 hours:  Blood pressure (!) 188/100, pulse 68, temperature 98.2 F (36.8 C), temperature source Oral, resp. rate 18, height 5\' 9"  (1.753 m), weight 142 lb 12.8 oz (64.8 kg), SpO2 100 %.    HEENT: No thrush or ulcers. Resp: Lungs clear bilaterally. Cardio: Regular rate and rhythm. GI: Abdomen soft and nontender.  No hepatosplenomegaly. Vascular: No left leg edema.   Lab Results:  Lab Results  Component Value Date   WBC 6.3 11/23/2017   HGB 10.5 (L) 11/23/2017   HCT 32.9 (L) 11/23/2017   MCV 77.4 (L) 11/23/2017   PLT 127 (L) 11/23/2017   NEUTROABS 4.3 11/23/2017    Imaging:  No results found.  Medications: I have reviewed the patient's current medications.  Assessment/Plan: 1. Chronic myelogenous leukemia, diagnosed in January of 2009. He remains in hematologic remission. He is taking Gleevec at a dose of 200 mg daily. The peripheral blood PCR was markedly increased in May 2013, likely reflecting medical noncompliance. The peripheral blood PCRwas stable on 05/11/2017.  Trial of dasatinib April/May 2019; discontinued due to poor tolerance with diarrhea and nausea  Gleevec resumed May 2019  Gleevec placed on hold 11/02/2017 2. 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 3. Status post C3-C4, C4-C5, anterior cervical diskectomy and fusion with allograft and plating  09/30/2010. 4. 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 5. Depression 6. Diarrhea, ? related to chronic pancreatitis versus Gleevec. He takes Lomotil as needed.  7. Indurated facial skin lesion 11/20/2007 with a history of MRSA skin infection of the submental area in May 2008. The induration resolved with doxycycline. 8. Left knee arthroscopy 05/25/2007. 9. Postoperative left knee effusion/pain, likely related to gout. 10. History of gout.  11. Chronic pancreatitis. 12. Status post right above-the-knee amputation. 13. MRSA infection of the submental area May 2008. 14. History of tobacco, alcohol, and cocaine use. 15. History of coronary artery disease. 16. "Shotty" lymphadenopathy of the neck, axilla, and left groin in 2009.  17. History of a microcytic anemia.  18. History of a right olecranon bursa lesion, ? gouty tophus. 19. 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 He previously took AndroGel. 20. History of anemia secondary to chronic disease and Gleevec therapy.  21. history of anorexia-potentially related to [place]. He reports Medicaid would not pay for Megace.  22. Chronic left knee and left foot pain.  23. Status post removal of a left knee screw 07/03/2012. 24. History of hypokalemia. Likely related to diarrhea.He is on a potassium supplement. 25. Status post left foot surgery. 26. Pulsatile fullness right neck. Evaluated by vascular surgery status post CT angiogram 03/07/2014 with no carotid artery aneurysm identified. 27. Cough-abnormal lung exam 05/12/2015. He completed a course of Levaquin. Chest xray negative. 28. Hospitalization 09/20/2016 through 10/07/2016 with unresponsiveness/acute metabolic encephalopathy 29. Upper endoscopy 11/21/2017- normal esophagus; erythematous mucosa in the antrum which  was biopsied; normal examined duodenum.     Disposition: [name] overall appears improved.  The  nausea, diarrhea and abdominal pain are better.  He is currently off of treatment for CML.  The plan is to resume dasatinib at a lower dose.  He is in agreement.  We anticipate he will begin dasatinib 50 mg daily early next week.  He will return for lab and follow-up in approximately 3 weeks.  He will contact the office in the interim with any problems.  We specifically discussed recurrent diarrhea.  He will resume his blood pressure medication.  Plan reviewed with Dr. [name].    [name] ANP/GNP-BC   11/23/2017  2:09 PM

## 2017-11-23 NOTE — Telephone Encounter (Addendum)
Pt was instructed per [name] to start retaking his potassium 20MEQ daily. Pt verbalized understanding.   ----- Message from Owens Shark, NP sent at 11/23/2017  2:43 PM EDT ----- Please make sure he is taking potassium and confirm dose.

## 2017-11-23 NOTE — Telephone Encounter (Signed)
Oral Chemotherapy Pharmacist Encounter  I spoke with patient in exam room for overview of: Sprycel.  Counseled patient on administration, dosing, side effects, monitoring, drug-food interactions, safe handling, storage, and disposal.  Patient will take Sprycel 50 mg tablets, 1 tablet by mouth once daily without regard to food.  Patient instructed that administering with food may decrease GI irritation.   Patient knows to avoid grapefruit or grapefruit juice while on therapy with Sprycel.  Sprycel start date: planned for 11/29/2017  Adverse effects include but are not limited to: fluid retention, nausea, diarrhea, rash, fatigue, dyspnea, hemorrhage, and thrombocytopenia.  Patient has anti-emetic on hand and knows to take it if nausea develops.   Patient will obtain anti diarrheal and alert the office of 4 or more loose stools above baseline.  Patient understands Sprycel is being re-initiated at lower dose to prevent diarrhea symptoms which were experienced at Sprycel 100mg .  Reviewed with patient importance of keeping a medication schedule and plan for any missed doses.  We discussed discontinuation of pantoprazole and initiation of ranitidine.  Mr. Mainwaring voiced understanding and appreciation.   All questions answered. Medication reconciliation performed and medication/allergy list updated.  Insurance authorization for Sprycel was approved in April 2019. Sprycel 50 mg capsules will be ordered at the [facility] and made available for pickup after 2 PM on Monday, 11/27/2017. We will work with the pharmacy for test claim to obtain copayment information (patient appears to have Medicare/Medicaid coverage). We will follow-up with the patient about co-pay due at the pharmacy and medication acquisition date.  Patient knows to call the office with questions or concerns. Oral Oncology Clinic will continue to follow.  Thank you,  Johny Drilling, PharmD, BCPS, BCOP   11/23/2017  2:01 PM Oral Oncology Clinic [phone]

## 2017-11-23 NOTE — Telephone Encounter (Signed)
Oral Oncology Pharmacist Encounter  Received new prescription for Sprycel (dasatinib) for the treatment of chronic myeloid leukemia, planned duration until disease progression or unacceptable toxicity.  Original diagnosis 2009, started on Gleevec at that time Patient reported intermittent nausea, abdominal pain, and diarrhea during entire course of treatment with Gleevec.  Had a trial of Sprycel in early 2019 at 100mg  daily with intolerable diarrhea Patient restarted on [place] after Sprycel trial, since discontinued (last dose: 11/02/2017) due to ADEs.  Patient now agreeable to re-try Sprycel at decreased dose for increased toleration. Noted patient with chronic pancreatitis which likely exacerbates GI symptoms  Labs from 11/23/2017 assessed Noted SCr=1.81 (baseline 1.4-1.8), est CrCl ~ 30 mL/min, no dose adjustment recommended by manufacturer for renal dysfunction LFTs have returned to [facility]  Noted patient with uncontrolled hypertension, on multiple anti-hypertensive agents, will continue to be monitored. Will discuss need for medication adherence with patient at initial counseling Pt appears to be without other major cardiac history  Current medication list in Epic reviewed, DDI with Sprycel and pantoprazole identified:  Category X interaction due to impaired absorption of Sprycel in increasing pH conditions. Patient will be transitioned to ranitidine separated 12 hours from Sprycel administration.  Prescription has been e-scribed to the [facility] for benefits analysis and approval.  Oral Oncology Clinic will continue to follow for insurance authorization, copayment issues, initial counseling and start date.  Johny Drilling, PharmD, BCPS, BCOP  11/23/2017 2:01 PM Oral Oncology Clinic [phone]

## 2017-11-24 ENCOUNTER — Other Ambulatory Visit: Payer: Self-pay

## 2017-11-24 MED ORDER — DASATINIB 50 MG PO TABS: 50.0000 mg | ORAL_TABLET | Freq: Every day | ORAL | 0 refills | Status: DC

## 2017-11-27 MED FILL — SPRYCEL 50 MG TABLET: 50 | 30 days supply | Qty: 30 | Fill #0

## 2017-12-01 NOTE — Telephone Encounter (Signed)
Oral Oncology Patient Advocate Encounter  Verified with [facility] that Sprycel was picked up on 12-01-17.  [place] Patient [place] Phone [phone] Fax [phone]

## 2017-12-06 ENCOUNTER — Telehealth: Payer: Self-pay | Admitting: *Deleted

## 2017-12-06 ENCOUNTER — Telehealth: Payer: Self-pay

## 2017-12-06 DIAGNOSIS — C9210 Chronic myeloid leukemia, BCR/ABL-positive, not having achieved remission: Secondary | ICD-10-CM

## 2017-12-06 MED ORDER — ONDANSETRON 4 MG PO TBDP: 4.0000 mg | ORAL_TABLET | Freq: Three times a day (TID) | ORAL | 0 refills | Status: DC | PRN

## 2017-12-06 NOTE — Telephone Encounter (Signed)
Returned call to pt regarding triage message below. Pt states that he has "about 2-3 episodes of diarrhea a day, and vomiting maybe once every other day". Pt states that his symptoms started "a couple of days ago". Per Dr. [name], pt to utilize lomotil/imodium as needed and zofran for nausea. Pt voiced understanding and will keep office updated on symptoms

## 2017-12-06 NOTE — Telephone Encounter (Addendum)
"  I can't take that new cancer medication Dr. [name] prescribed.  Not sure if it's the medication or the cancer killing me.  I feel weak after my bad night last night and today.  Started last week and it keeps giving me bad diarrhea.  Not sure how many times last night.  Maybe twice so far today; I don't remember.  BC helps a little for pain.  Sometimes I have pain in the bottom of my stomach.  Not vomiting as much but it just comes up when I eat.  Color varied depending on what I ate."  Declined [facility] visit today.  "There's no way I can come in today.  See about tomorrow."

## 2017-12-14 ENCOUNTER — Inpatient Hospital Stay: Payer: Medicare Other

## 2017-12-14 ENCOUNTER — Telehealth: Payer: Self-pay | Admitting: Oncology

## 2017-12-14 ENCOUNTER — Inpatient Hospital Stay: Payer: Medicare Other | Attending: Nurse Practitioner | Admitting: Nurse Practitioner

## 2017-12-14 ENCOUNTER — Encounter: Payer: Self-pay | Admitting: Nurse Practitioner

## 2017-12-14 VITALS — BP 190/100 | HR 80 | Temp 98.2°F | Resp 18 | Ht 69.0 in | Wt 142.7 lb

## 2017-12-14 DIAGNOSIS — C9211 Chronic myeloid leukemia, BCR/ABL-positive, in remission: Secondary | ICD-10-CM | POA: Diagnosis present

## 2017-12-14 DIAGNOSIS — C9210 Chronic myeloid leukemia, BCR/ABL-positive, not having achieved remission: Secondary | ICD-10-CM

## 2017-12-14 LAB — CBC WITH DIFFERENTIAL (CANCER CENTER ONLY)
BASOS ABS: 0 10*3/uL (ref 0.0–0.1)
Basophils Relative: 0 %
Eosinophils Absolute: 0.3 10*3/uL (ref 0.0–0.5)
Eosinophils Relative: 6 %
HEMATOCRIT: 32 % — AB (ref 38.4–49.9)
HEMOGLOBIN: 10.1 g/dL — AB (ref 13.0–17.1)
Lymphocytes Relative: 22 %
Lymphs Abs: 1.2 10*3/uL (ref 0.9–3.3)
MCH: 24.5 pg — ABNORMAL LOW (ref 27.2–33.4)
MCHC: 31.6 g/dL — ABNORMAL LOW (ref 32.0–36.0)
MCV: 77.7 fL — ABNORMAL LOW (ref 79.3–98.0)
Monocytes Absolute: 0.5 10*3/uL (ref 0.1–0.9)
Monocytes Relative: 8 %
NEUTROS ABS: 3.6 10*3/uL (ref 1.5–6.5)
NEUTROS PCT: 64 %
Platelet Count: 155 10*3/uL (ref 140–400)
RBC: 4.12 MIL/uL — AB (ref 4.20–5.82)
RDW: 19.9 % — ABNORMAL HIGH (ref 11.0–14.6)
WBC: 5.6 10*3/uL (ref 4.0–10.3)

## 2017-12-14 LAB — CMP (CANCER CENTER ONLY)
ALT: 26 U/L (ref 0–44)
ANION GAP: 9 (ref 5–15)
AST: 30 U/L (ref 15–41)
Albumin: 2.4 g/dL — ABNORMAL LOW (ref 3.5–5.0)
Alkaline Phosphatase: 139 U/L — ABNORMAL HIGH (ref 38–126)
BUN: 25 mg/dL — ABNORMAL HIGH (ref 8–23)
CO2: 25 mmol/L (ref 22–32)
Calcium: 8.5 mg/dL — ABNORMAL LOW (ref 8.9–10.3)
Chloride: 106 mmol/L (ref 98–111)
Creatinine: 1.94 mg/dL — ABNORMAL HIGH (ref 0.61–1.24)
GFR, EST NON AFRICAN AMERICAN: 33 mL/min — AB (ref >60)
GFR, Est AFR Am: 38 mL/min — ABNORMAL LOW (ref >60)
Glucose, Bld: 129 mg/dL — ABNORMAL HIGH (ref 70–99)
Potassium: 3.4 mmol/L — ABNORMAL LOW (ref 3.5–5.1)
SODIUM: 140 mmol/L (ref 135–145)
Total Bilirubin: 0.3 mg/dL (ref 0.3–1.2)
Total Protein: 5 g/dL — ABNORMAL LOW (ref 6.5–8.1)

## 2017-12-14 NOTE — Progress Notes (Addendum)
[place] OFFICE PROGRESS NOTE   Diagnosis:  CML  INTERVAL HISTORY:   Mr. Amory returns as scheduled.  He began dasatinib about a week ago.  He took for 1 or 2 days and then had to stop due to nausea/vomiting/diarrhea.  He thinks he has been taking it every 1 or 2 days since.  Appetite is poor.  Abdominal pain occurs intermittently.  He in general does not feel well.  Objective:  Vital signs in last 24 hours:  Blood pressure (!) 190/100, pulse 80, temperature 98.2 F (36.8 C), temperature source Oral, resp. rate 18, height 5\' 9"  (1.753 m), weight 142 lb 11.2 oz (64.7 kg), SpO2 100 %.    HEENT: No thrush or ulcers. Resp: Lungs clear bilaterally. Cardio: Regular rate and rhythm. GI: Abdomen soft and nontender.  No hepatosplenomegaly. Vascular: No left leg edema. Neuro: Alert and oriented.    Lab Results:  Lab Results  Component Value Date   WBC 5.6 12/14/2017   HGB 10.1 (L) 12/14/2017   HCT 32.0 (L) 12/14/2017   MCV 77.7 (L) 12/14/2017   PLT 155 12/14/2017   NEUTROABS 3.6 12/14/2017    Imaging:  No results found.  Medications: I have reviewed the patient's current medications.  Assessment/Plan: 1. Chronic myelogenous leukemia, diagnosed in January of 2009. He remains in hematologic remission. He is taking Gleevec at a dose of 200 mg daily. The peripheral blood PCR was markedly increased in May 2013, likely reflecting medical noncompliance. The peripheral blood PCRwas stable on 05/11/2017.  Trial of dasatinib April/May 2019; discontinued due to poor tolerance with diarrhea and nausea  Gleevec resumed May 2019  [place] placed on hold 11/02/2017  Trial of dasatinib 50 mg daily approximately 11/29/2017  Dasatinib placed on hold 12/14/2017 2. 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 3. Status post C3-C4, C4-C5, anterior cervical diskectomy and fusion with allograft and plating 09/30/2010. 4. 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 5. Depression 6. Diarrhea, ? related to chronic pancreatitis versus Gleevec. He takes Lomotil as needed.  7. Indurated facial skin lesion 11/20/2007 with a history of MRSA skin infection of the submental area in May 2008. The induration resolved with doxycycline. 8. Left knee arthroscopy 05/25/2007. 9. Postoperative left knee effusion/pain, likely related to gout. 10. History of gout.  11. Chronic pancreatitis. 12. Status post right above-the-knee amputation. 13. MRSA infection of the submental area May 2008. 14. History of tobacco, alcohol, and cocaine use. 15. History of coronary artery disease. 16. "Shotty" lymphadenopathy of the neck, axilla, and left groin in 2009.  17. History of a microcytic anemia.  18. History of a right olecranon bursa lesion, ? gouty tophus. 19. 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 He previously took AndroGel. 20. History of anemia secondary to chronic disease and Gleevec therapy.  21. history of anorexia-potentially related to [place]. He reports Medicaid would not pay for Megace.  22. Chronic left knee and left foot pain.  23. Status post removal of a left knee screw 07/03/2012. 24. History of hypokalemia. Likely related to diarrhea.He is on a potassium supplement. 25. Status post left foot surgery. 26. Pulsatile fullness right neck. Evaluated by vascular surgery status post CT angiogram 03/07/2014 with no carotid artery aneurysm identified. 27. Cough-abnormal lung exam 05/12/2015. He completed a course of Levaquin. Chest xray negative. 28. Hospitalization 09/20/2016 through 10/07/2016 with unresponsiveness/acute metabolic encephalopathy 29. Upper endoscopy 11/21/2017- normal esophagus; erythematous mucosa in the antrum which  was biopsied; normal examined duodenum.   Disposition: [name] is unable to tolerate Gleevec or dasatinib.  We have instructed him to place dasatinib on hold.  He will  return for labs and follow-up in 3 to 4 weeks.  We will discuss other treatment options for the CML at that time.  Patient seen with Dr. [name].    [name] ANP/GNP-BC   12/14/2017  3:16 PM This was a shared visit with [name].  Mr. Payette had diarrhea with the reduced dose dasatinib.  It appears he is unable to tolerate imatinib or dasatinib.  He will discontinue treatment and return for an office visit in 3 weeks.  I think it would be helpful for Mr. Snowden to consolidate his medical regimen as possible.  He will follow-up with Dr. Jeanie Cooks for management of hypertension.  We will investigate alternate treatment options for the CML.  Julieanne Manson, MD

## 2017-12-14 NOTE — Telephone Encounter (Signed)
Appointments scheduled AVS/Calendar printed per 8/8 los °

## 2017-12-27 LAB — BCR/ABL

## 2018-01-01 ENCOUNTER — Other Ambulatory Visit: Payer: Self-pay | Admitting: Emergency Medicine

## 2018-01-01 DIAGNOSIS — C9210 Chronic myeloid leukemia, BCR/ABL-positive, not having achieved remission: Secondary | ICD-10-CM

## 2018-01-01 MED ORDER — ONDANSETRON 4 MG PO TBDP: 4.0000 mg | ORAL_TABLET | Freq: Three times a day (TID) | ORAL | 0 refills | Status: DC | PRN

## 2018-01-11 ENCOUNTER — Inpatient Hospital Stay: Payer: Medicare Other | Attending: Nurse Practitioner

## 2018-01-11 ENCOUNTER — Inpatient Hospital Stay (HOSPITAL_BASED_OUTPATIENT_CLINIC_OR_DEPARTMENT_OTHER): Payer: Medicare Other | Admitting: Oncology

## 2018-01-11 ENCOUNTER — Telehealth: Payer: Self-pay | Admitting: Oncology

## 2018-01-11 ENCOUNTER — Other Ambulatory Visit: Payer: Self-pay

## 2018-01-11 VITALS — BP 145/89 | HR 55 | Temp 98.1°F | Resp 18 | Ht 69.0 in | Wt 130.9 lb

## 2018-01-11 DIAGNOSIS — C9210 Chronic myeloid leukemia, BCR/ABL-positive, not having achieved remission: Secondary | ICD-10-CM

## 2018-01-11 DIAGNOSIS — C9211 Chronic myeloid leukemia, BCR/ABL-positive, in remission: Secondary | ICD-10-CM

## 2018-01-11 LAB — CMP (CANCER CENTER ONLY)
ALBUMIN: 2.7 g/dL — AB (ref 3.5–5.0)
ALT: 46 U/L — AB (ref 0–44)
AST: 56 U/L — AB (ref 15–41)
Alkaline Phosphatase: 174 U/L — ABNORMAL HIGH (ref 38–126)
Anion gap: 10 (ref 5–15)
BILIRUBIN TOTAL: 0.3 mg/dL (ref 0.3–1.2)
BUN: 24 mg/dL — AB (ref 8–23)
CO2: 30 mmol/L (ref 22–32)
Calcium: 8.9 mg/dL (ref 8.9–10.3)
Chloride: 104 mmol/L (ref 98–111)
Creatinine: 2.09 mg/dL — ABNORMAL HIGH (ref 0.61–1.24)
GFR, Est AFR Am: 35 mL/min — ABNORMAL LOW (ref >60)
GFR, Estimated: 30 mL/min — ABNORMAL LOW (ref >60)
GLUCOSE: 96 mg/dL (ref 70–99)
Potassium: 3.1 mmol/L — ABNORMAL LOW (ref 3.5–5.1)
SODIUM: 144 mmol/L (ref 135–145)
TOTAL PROTEIN: 5.4 g/dL — AB (ref 6.5–8.1)

## 2018-01-11 LAB — CBC WITH DIFFERENTIAL (CANCER CENTER ONLY)
BASOS PCT: 1 %
Basophils Absolute: 0.1 10*3/uL (ref 0.0–0.1)
Eosinophils Absolute: 0.4 10*3/uL (ref 0.0–0.5)
Eosinophils Relative: 6 %
HEMATOCRIT: 37.6 % — AB (ref 38.4–49.9)
HEMOGLOBIN: 12 g/dL — AB (ref 13.0–17.1)
Lymphocytes Relative: 17 %
Lymphs Abs: 1.3 10*3/uL (ref 0.9–3.3)
MCH: 24.2 pg — ABNORMAL LOW (ref 27.2–33.4)
MCHC: 31.9 g/dL — ABNORMAL LOW (ref 32.0–36.0)
MCV: 76 fL — ABNORMAL LOW (ref 79.3–98.0)
MONOS PCT: 11 %
Monocytes Absolute: 0.8 10*3/uL (ref 0.1–0.9)
NEUTROS PCT: 65 %
Neutro Abs: 4.8 10*3/uL (ref 1.5–6.5)
Platelet Count: 128 10*3/uL — ABNORMAL LOW (ref 140–400)
RBC: 4.95 MIL/uL (ref 4.20–5.82)
RDW: 18.5 % — ABNORMAL HIGH (ref 11.0–14.6)
WBC: 7.3 10*3/uL (ref 4.0–10.3)

## 2018-01-11 MED ORDER — POTASSIUM CHLORIDE CRYS ER 20 MEQ PO TBCR: 20.0000 meq | EXTENDED_RELEASE_TABLET | Freq: Every day | ORAL | 1 refills | Status: DC

## 2018-01-11 NOTE — Telephone Encounter (Signed)
Scheduled appt per 9/5 los - sent reminder letter in the mail with appt date and time.

## 2018-01-11 NOTE — Progress Notes (Signed)
[place] OFFICE PROGRESS NOTE   Diagnosis: CML  INTERVAL HISTORY:   Mr. Kozikowski returns as scheduled.  He reports improvement in his appetite and diarrhea.  He now has 1 or 2 loose stools per day.  He has intermittent nausea.  He has returned to exercising at the gym.  He remains off of treatment for CML.  He is smoking, but denies recent alcohol use.  Objective:  Vital signs in last 24 hours:  Blood pressure (!) 145/89, pulse (!) 55, temperature 98.1 F (36.7 C), temperature source Oral, resp. rate 18, height 5\' 9"  (1.753 m), weight 130 lb 14.4 oz (59.4 kg), SpO2 98 %.    Resp: Lungs clear bilaterally Cardio: Regular rate and rhythm GI: Nontender, no mass, no hepatomegaly Vascular: No leg edema   Lab Results:  Lab Results  Component Value Date   WBC 7.3 01/11/2018   HGB 12.0 (L) 01/11/2018   HCT 37.6 (L) 01/11/2018   MCV 76.0 (L) 01/11/2018   PLT 128 (L) 01/11/2018   NEUTROABS 4.8 01/11/2018    CMP  Lab Results  Component Value Date   NA 144 01/11/2018   K 3.1 (L) 01/11/2018   CL 104 01/11/2018   CO2 30 01/11/2018   GLUCOSE 96 01/11/2018   BUN 24 (H) 01/11/2018   CREATININE 2.09 (H) 01/11/2018   CALCIUM 8.9 01/11/2018   PROT 5.4 (L) 01/11/2018   ALBUMIN 2.7 (L) 01/11/2018   AST 56 (H) 01/11/2018   ALT 46 (H) 01/11/2018   ALKPHOS 174 (H) 01/11/2018   BILITOT 0.3 01/11/2018   GFRNONAA 30 (L) 01/11/2018   GFRAA 35 (L) 01/11/2018     Medications: I have reviewed the patient's current medications.   Assessment/Plan: 1. Chronic myelogenous leukemia, diagnosed in January of 2009. He remains in hematologic remission. He is taking Gleevec at a dose of 200 mg daily. The peripheral blood PCR was markedly increased in May 2013, likely reflecting medical noncompliance. The peripheral blood PCRwas stable on 05/11/2017.  Trial of dasatinib April/May 2019; discontinued due to poor tolerance with diarrhea and nausea  Gleevec resumed May  2019  [place] placed on hold 11/02/2017  Trial of dasatinib 50 mg daily approximately 11/29/2017  Dasatinib placed on hold 12/14/2017 2. 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 3. Status post C3-C4, C4-C5, anterior cervical diskectomy and fusion with allograft and plating 09/30/2010. 4. 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 5. Depression 6. Diarrhea, ? related to chronic pancreatitis versus Gleevec. He takes Lomotil as needed.  7. Indurated facial skin lesion 11/20/2007 with a history of MRSA skin infection of the submental area in May 2008. The induration resolved with doxycycline. 8. Left knee arthroscopy 05/25/2007. 9. Postoperative left knee effusion/pain, likely related to gout. 10. History of gout.  11. Chronic pancreatitis. 12. Status post right above-the-knee amputation. 13. MRSA infection of the submental area May 2008. 14. History of tobacco, alcohol, and cocaine use. 15. History of coronary artery disease. 16. "Shotty" lymphadenopathy of the neck, axilla, and left groin in 2009.  17. History of a microcytic anemia.  18. History of a right olecranon bursa lesion, ? gouty tophus. 19. 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 He previously took AndroGel. 20. History of anemia secondary to chronic disease and Gleevec therapy.  21. history of anorexia-potentially related to [place]. He reports Medicaid would not pay for Megace.  22. Chronic left knee and left foot pain.  23. Status post  removal of a left knee screw 07/03/2012. 24. History of hypokalemia. Likely related to diarrhea.He is on a potassium supplement. 25. Status post left foot surgery. 26. Pulsatile fullness right neck. Evaluated by vascular surgery status post CT angiogram 03/07/2014 with no carotid artery aneurysm identified. 27. Cough-abnormal lung exam 05/12/2015. He completed a course of Levaquin. Chest xray negative. 28. Hospitalization  09/20/2016 through 10/07/2016 with unresponsiveness/acute metabolic encephalopathy 29. Upper endoscopy 11/21/2017- normal esophagus; erythematous mucosa in the antrum which was biopsied; normal examined duodenum.     Disposition: Mr. Humber has an improved performance status since discontinuing tyrosine kinase inhibitor therapy.  He remains in hematologic remission from the [facility], but he understands the [facility] will progress if he does not resume treatment.  His weight is lower today, but he was weighed without the leg prosthesis.  He reports improvement in his appetite and energy level.  Diarrhea has also improved.  He has run out of testing.  We will refill the potassium today.  He will continue follow-up with Dr. Jeanie Cooks for management of hypertension and renal insufficiency.  He will return for an office and lab visit in 1 month.  I will investigate treatment options for the CML.  [name], MD  01/11/2018  11:52 AM

## 2018-01-17 ENCOUNTER — Telehealth: Payer: Self-pay | Admitting: Oncology

## 2018-01-17 NOTE — Telephone Encounter (Signed)
LT PAL - moved lab/fu from 9/30 to 10/3. Spoke with patient he is aware.   Per patient he has started going to the gym and has built up an appetite. Per patient he would like to start taking Gleevec as he believes this is better than that other one and he would like Dr. [name] to call him to discuss. This message has been forward to GBS/desk nurse/LT.

## 2018-02-05 ENCOUNTER — Other Ambulatory Visit: Payer: Self-pay

## 2018-02-05 ENCOUNTER — Ambulatory Visit: Payer: Self-pay | Admitting: Nurse Practitioner

## 2018-02-07 ENCOUNTER — Emergency Department (HOSPITAL_COMMUNITY): Payer: Medicare Other

## 2018-02-07 ENCOUNTER — Other Ambulatory Visit: Payer: Self-pay

## 2018-02-07 ENCOUNTER — Encounter (HOSPITAL_COMMUNITY): Payer: Self-pay | Admitting: Emergency Medicine

## 2018-02-07 ENCOUNTER — Emergency Department (HOSPITAL_COMMUNITY)
Admission: EM | Admit: 2018-02-07 | Discharge: 2018-02-07 | Disposition: A | Discharge status: Home / Self Care | Payer: Medicare Other | Attending: Emergency Medicine | Admitting: Emergency Medicine

## 2018-02-07 DIAGNOSIS — I252 Old myocardial infarction: Secondary | ICD-10-CM | POA: Insufficient documentation

## 2018-02-07 DIAGNOSIS — I129 Hypertensive chronic kidney disease with stage 1 through stage 4 chronic kidney disease, or unspecified chronic kidney disease: Secondary | ICD-10-CM | POA: Insufficient documentation

## 2018-02-07 DIAGNOSIS — M25562 Pain in left knee: Secondary | ICD-10-CM | POA: Diagnosis not present

## 2018-02-07 DIAGNOSIS — E876 Hypokalemia: Secondary | ICD-10-CM | POA: Diagnosis not present

## 2018-02-07 DIAGNOSIS — Z79899 Other long term (current) drug therapy: Secondary | ICD-10-CM | POA: Diagnosis not present

## 2018-02-07 DIAGNOSIS — Z856 Personal history of leukemia: Secondary | ICD-10-CM | POA: Diagnosis not present

## 2018-02-07 DIAGNOSIS — G8929 Other chronic pain: Secondary | ICD-10-CM | POA: Diagnosis not present

## 2018-02-07 DIAGNOSIS — Z96652 Presence of left artificial knee joint: Secondary | ICD-10-CM | POA: Insufficient documentation

## 2018-02-07 DIAGNOSIS — F17210 Nicotine dependence, cigarettes, uncomplicated: Secondary | ICD-10-CM | POA: Insufficient documentation

## 2018-02-07 DIAGNOSIS — N189 Chronic kidney disease, unspecified: Secondary | ICD-10-CM | POA: Diagnosis not present

## 2018-02-07 DIAGNOSIS — I10 Essential (primary) hypertension: Secondary | ICD-10-CM

## 2018-02-07 LAB — CBC
HCT: 36.1 % — ABNORMAL LOW (ref 39.0–52.0)
Hemoglobin: 11.1 g/dL — ABNORMAL LOW (ref 13.0–17.0)
MCH: 24.1 pg — ABNORMAL LOW (ref 26.0–34.0)
MCHC: 30.7 g/dL (ref 30.0–36.0)
MCV: 78.5 fL (ref 78.0–100.0)
PLATELETS: 139 10*3/uL — AB (ref 150–400)
RBC: 4.6 MIL/uL (ref 4.22–5.81)
RDW: 18.7 % — ABNORMAL HIGH (ref 11.5–15.5)
WBC: 9.1 10*3/uL (ref 4.0–10.5)

## 2018-02-07 LAB — BASIC METABOLIC PANEL
ANION GAP: 9 (ref 5–15)
BUN: 21 mg/dL (ref 8–23)
CO2: 29 mmol/L (ref 22–32)
Calcium: 7.9 mg/dL — ABNORMAL LOW (ref 8.9–10.3)
Chloride: 104 mmol/L (ref 98–111)
Creatinine, Ser: 2.14 mg/dL — ABNORMAL HIGH (ref 0.61–1.24)
GFR calc Af Amer: 34 mL/min — ABNORMAL LOW (ref >60)
GFR, EST NON AFRICAN AMERICAN: 29 mL/min — AB (ref >60)
GLUCOSE: 88 mg/dL (ref 70–99)
POTASSIUM: 2.6 mmol/L — AB (ref 3.5–5.1)
SODIUM: 142 mmol/L (ref 135–145)

## 2018-02-07 LAB — URINALYSIS, ROUTINE W REFLEX MICROSCOPIC
BILIRUBIN URINE: NEGATIVE
Glucose, UA: NEGATIVE mg/dL
Hgb urine dipstick: NEGATIVE
KETONES UR: NEGATIVE mg/dL
LEUKOCYTES UA: NEGATIVE
Nitrite: NEGATIVE
PH: 7 (ref 5.0–8.0)
Specific Gravity, Urine: 1.014 (ref 1.005–1.030)

## 2018-02-07 LAB — MAGNESIUM: Magnesium: 1.8 mg/dL (ref 1.7–2.4)

## 2018-02-07 LAB — CBG MONITORING, ED: Glucose-Capillary: 87 mg/dL (ref 70–99)

## 2018-02-07 MED ORDER — POTASSIUM CHLORIDE CRYS ER 20 MEQ PO TBCR
60.0000 meq | EXTENDED_RELEASE_TABLET | Freq: Once | ORAL | Status: AC
  Administered 2018-02-07: 60 meq via ORAL
  Filled 2018-02-07: qty 3

## 2018-02-07 MED ORDER — SODIUM CHLORIDE 0.9 % IV BOLUS
500.0000 mL | Freq: Once | INTRAVENOUS | Status: AC
  Administered 2018-02-07: 500 mL via INTRAVENOUS

## 2018-02-07 MED ORDER — OXYCODONE-ACETAMINOPHEN 5-325 MG PO TABS
1.0000 | ORAL_TABLET | Freq: Once | ORAL | Status: AC
  Administered 2018-02-07: 1 via ORAL
  Filled 2018-02-07: qty 1

## 2018-02-07 NOTE — ED Notes (Signed)
Patient transported to X-ray 

## 2018-02-07 NOTE — ED Triage Notes (Signed)
Pt arrived via EMS. Pt from [employer] working out on leg press. Pt has R BKA, so leg press used with Left leg. Pt felt diaphoretic and weak and was unable to press leg back up on machine. Pt A&Ox4

## 2018-02-07 NOTE — Discharge Instructions (Addendum)
Follow up with your doctor for EKG, blood pressure, potassium recheck. Take your potassium twice daily.

## 2018-02-07 NOTE — ED Provider Notes (Signed)
  Face-to-face evaluation   History: Here for evaluation of left leg discomfort, which started when he was using a leg press machine.  He got trapped and could not get out without help.  Physical exam: Alert elderly male.  Calm cooperative.  No respiratory distress.  He is lucid.  Medical screening examination/treatment/procedure(s) were conducted as a shared visit with non-physician practitioner(s) and myself.  I personally evaluated the patient during the encounter    Daleen Bo, MD 02/09/18 [phone]

## 2018-02-07 NOTE — ED Notes (Signed)
Patient verbalizes understanding of discharge instructions. Opportunity for questioning and answers were provided. Armband removed by staff, pt discharged from ED in wheelchair.  
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
## 2018-02-08 ENCOUNTER — Encounter: Payer: Self-pay | Admitting: Nurse Practitioner

## 2018-02-08 ENCOUNTER — Inpatient Hospital Stay: Payer: Medicare Other | Attending: Nurse Practitioner

## 2018-02-08 ENCOUNTER — Inpatient Hospital Stay (HOSPITAL_BASED_OUTPATIENT_CLINIC_OR_DEPARTMENT_OTHER): Payer: Medicare Other | Admitting: Nurse Practitioner

## 2018-02-08 ENCOUNTER — Telehealth: Payer: Self-pay

## 2018-02-08 VITALS — BP 164/96 | HR 57 | Temp 98.3°F | Resp 20 | Ht 69.0 in | Wt 135.5 lb

## 2018-02-08 DIAGNOSIS — C9211 Chronic myeloid leukemia, BCR/ABL-positive, in remission: Secondary | ICD-10-CM | POA: Insufficient documentation

## 2018-02-08 DIAGNOSIS — C9210 Chronic myeloid leukemia, BCR/ABL-positive, not having achieved remission: Secondary | ICD-10-CM

## 2018-02-08 DIAGNOSIS — Z23 Encounter for immunization: Secondary | ICD-10-CM | POA: Insufficient documentation

## 2018-02-08 LAB — CBC WITH DIFFERENTIAL (CANCER CENTER ONLY)
Basophils Absolute: 0 10*3/uL (ref 0.0–0.1)
Basophils Relative: 0 %
EOS PCT: 5 %
Eosinophils Absolute: 0.4 10*3/uL (ref 0.0–0.5)
HEMATOCRIT: 35.7 % — AB (ref 38.4–49.9)
HEMOGLOBIN: 11.4 g/dL — AB (ref 13.0–17.1)
LYMPHS ABS: 1.4 10*3/uL (ref 0.9–3.3)
LYMPHS PCT: 17 %
MCH: 24.4 pg — ABNORMAL LOW (ref 27.2–33.4)
MCHC: 32 g/dL (ref 32.0–36.0)
MCV: 76.3 fL — ABNORMAL LOW (ref 79.3–98.0)
Monocytes Absolute: 0.9 10*3/uL (ref 0.1–0.9)
Monocytes Relative: 10 %
NEUTROS ABS: 5.8 10*3/uL (ref 1.5–6.5)
NEUTROS PCT: 68 %
Platelet Count: 152 10*3/uL (ref 140–400)
RBC: 4.68 MIL/uL (ref 4.20–5.82)
RDW: 20.1 % — AB (ref 11.0–14.6)
WBC Count: 8.6 10*3/uL (ref 4.0–10.3)

## 2018-02-08 LAB — CMP (CANCER CENTER ONLY)
ALBUMIN: 2.4 g/dL — AB (ref 3.5–5.0)
ALK PHOS: 141 U/L — AB (ref 38–126)
ALT: 21 U/L (ref 0–44)
AST: 29 U/L (ref 15–41)
Anion gap: 9 (ref 5–15)
BILIRUBIN TOTAL: 0.2 mg/dL — AB (ref 0.3–1.2)
BUN: 22 mg/dL (ref 8–23)
CALCIUM: 8.5 mg/dL — AB (ref 8.9–10.3)
CO2: 29 mmol/L (ref 22–32)
CREATININE: 2.08 mg/dL — AB (ref 0.61–1.24)
Chloride: 106 mmol/L (ref 98–111)
GFR, EST NON AFRICAN AMERICAN: 30 mL/min — AB (ref >60)
GFR, Est AFR Am: 35 mL/min — ABNORMAL LOW (ref >60)
GLUCOSE: 148 mg/dL — AB (ref 70–99)
POTASSIUM: 3.2 mmol/L — AB (ref 3.5–5.1)
Sodium: 144 mmol/L (ref 135–145)
TOTAL PROTEIN: 5 g/dL — AB (ref 6.5–8.1)

## 2018-02-08 LAB — MAGNESIUM: MAGNESIUM: 1.7 mg/dL (ref 1.7–2.4)

## 2018-02-08 NOTE — Telephone Encounter (Signed)
Printed avs and calender of upcoming appointment. Per 10/3 los 

## 2018-02-08 NOTE — Progress Notes (Signed)
[facility] OFFICE PROGRESS NOTE   Diagnosis: CML  INTERVAL HISTORY:   [name] returns as scheduled.  He remains off of specific therapy for the CML.  He feels well.  He is going to the gym 3 to 4 days a week.  Appetite is better.  Diarrhea is better.  He may have 2 loose stools a week.  Nausea also significantly improved.  Objective:  Vital signs in last 24 hours:  Blood pressure (!) 164/96, pulse (!) 57, temperature 98.3 F (36.8 C), temperature source Oral, resp. rate 20, height 5\' 9"  (1.753 m), weight 135 lb 8 oz (61.5 kg), SpO2 98 %.    HEENT: No thrush or ulcers. Resp: Lungs clear bilaterally. Cardio: Regular rate and rhythm. GI: Abdomen soft and nontender.  No hepatospleno megaly. Vascular: No left leg edema.   Lab Results:  Lab Results  Component Value Date   WBC 8.6 02/08/2018   HGB 11.4 (L) 02/08/2018   HCT 35.7 (L) 02/08/2018   MCV 76.3 (L) 02/08/2018   PLT 152 02/08/2018   NEUTROABS 5.8 02/08/2018    Imaging:  Dg Knee Complete 4 Views Left  Result Date: 02/07/2018 CLINICAL DATA:  Hyperflexed knee while on leg press machine. EXAM: LEFT KNEE - COMPLETE 4+ VIEW COMPARISON:  LEFT knee radiograph September 04, 2017 FINDINGS: No acute fracture deformity or dislocation. Status post total knee arthroplasty with intact well-seated hardware, no periprosthetic lucency. No destructive bony lesions. Advanced vascular calcifications. IMPRESSION: 1. No acute fracture deformity or dislocation. 2. Status post total knee arthroplasty with stable postoperative appearance. Electronically Signed   By: Elon Alas M.D.   On: 02/07/2018 18:22    Medications: I have reviewed the patient's current medications.  Assessment/Plan: 1. Chronic myelogenous leukemia, diagnosed in January of 2009. He remains in hematologic remission. He is taking Gleevec at a dose of 200 mg daily. The peripheral blood PCR was markedly increased in May 2013, likely reflecting medical  noncompliance. The peripheral blood PCRwas stable on 05/11/2017.  Trial of dasatinib April/May 2019; discontinued due to poor tolerance with diarrhea and nausea  Gleevec resumed May 2019  [place] placed on hold 11/02/2017  Trial of dasatinib 50 mg daily approximately 11/29/2017  Dasatinib placed on hold 12/14/2017 2. 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 3. Status post C3-C4, C4-C5, anterior cervical diskectomy and fusion with allograft and plating 09/30/2010. 4. 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 5. Depression 6. Diarrhea, ? related to chronic pancreatitis versus Gleevec. He takes Lomotil as needed.  7. Indurated facial skin lesion 11/20/2007 with a history of MRSA skin infection of the submental area in May 2008. The induration resolved with doxycycline. 8. Left knee arthroscopy 05/25/2007. 9. Postoperative left knee effusion/pain, likely related to gout. 10. History of gout.  11. Chronic pancreatitis. 12. Status post right above-the-knee amputation. 13. MRSA infection of the submental area May 2008. 14. History of tobacco, alcohol, and cocaine use. 15. History of coronary artery disease. 16. "Shotty" lymphadenopathy of the neck, axilla, and left groin in 2009.  17. History of a microcytic anemia.  18. History of a right olecranon bursa lesion, ? gouty tophus. 19. 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 He previously took AndroGel. 20. History of anemia secondary to chronic disease and Gleevec therapy.  21. history of anorexia-potentially related to [place]. He reports Medicaid would not pay for Megace.  22. Chronic left knee and left foot pain.  23. Status  post removal of a left knee screw 07/03/2012. 24. History of hypokalemia. Likely related to diarrhea.He is on a potassium supplement. 25. Status post left foot surgery. 26. Pulsatile fullness right neck. Evaluated by vascular surgery status post CT  angiogram 03/07/2014 with no carotid artery aneurysm identified. 27. Cough-abnormal lung exam 05/12/2015. He completed a course of Levaquin. Chest xray negative. 28. Hospitalization 09/20/2016 through 10/07/2016 with unresponsiveness/acute metabolic encephalopathy 29. Upper endoscopy 11/21/2017-normal esophagus; erythematous mucosa in the antrum which was biopsied; normal examined duodenum.   Disposition: [name] performance status continues to be improved since discontinuing tyrosine kinase inhibitor therapy.  We reviewed the CBC from today.  He remains in hematologic remission from the [facility].  We decided to continue to follow him off of treatment for now.  He understands he will need to resume treatment in the future.  He will return for lab and follow-up in approximately 3 weeks.  He will contact the office in the interim with any problems.  Plan reviewed with Dr. [name].  [name] ANP/GNP-BC   02/08/2018  1:37 PM

## 2018-02-09 ENCOUNTER — Telehealth: Payer: Self-pay | Admitting: Hematology and Oncology

## 2018-02-09 ENCOUNTER — Telehealth: Payer: Self-pay | Admitting: *Deleted

## 2018-02-09 NOTE — Telephone Encounter (Signed)
Spoke with Pt. Confirmed he is taking 20 meq of potassium. Labs faxed to Dr. Santiago Bur office [phone]

## 2018-02-09 NOTE — Telephone Encounter (Signed)
-----   Message from Owens Shark, NP sent at 02/09/2018  9:05 AM EDT ----- Please find out if he is taking potassium and what the current dose is. Please forward a copy of the labs from 02/08/2018 to Dr. Jeanie Cooks,  does he want to change the antihypertensive regimen with the renal failure?

## 2018-02-09 NOTE — Telephone Encounter (Signed)
Left message for return call from patient to find out if he is currently taking Potassium 20MEQ as stated on his medication list.   Unable to locate a fax number for Dr. Santiago Bur office. Called the office at [phone] no answer or way to leave a message.

## 2018-02-27 ENCOUNTER — Telehealth: Payer: Self-pay | Admitting: Oncology

## 2018-02-27 ENCOUNTER — Inpatient Hospital Stay: Payer: Medicare Other

## 2018-02-27 ENCOUNTER — Inpatient Hospital Stay (HOSPITAL_BASED_OUTPATIENT_CLINIC_OR_DEPARTMENT_OTHER): Payer: Medicare Other | Admitting: Oncology

## 2018-02-27 VITALS — BP 162/100 | HR 66 | Temp 97.9°F | Resp 16 | Ht 69.0 in | Wt 141.9 lb

## 2018-02-27 DIAGNOSIS — C9210 Chronic myeloid leukemia, BCR/ABL-positive, not having achieved remission: Secondary | ICD-10-CM

## 2018-02-27 DIAGNOSIS — Z23 Encounter for immunization: Secondary | ICD-10-CM | POA: Diagnosis not present

## 2018-02-27 DIAGNOSIS — C9211 Chronic myeloid leukemia, BCR/ABL-positive, in remission: Secondary | ICD-10-CM

## 2018-02-27 LAB — CBC WITH DIFFERENTIAL (CANCER CENTER ONLY)
ABS IMMATURE GRANULOCYTES: 0.53 10*3/uL — AB (ref 0.00–0.07)
Basophils Absolute: 0.1 10*3/uL (ref 0.0–0.1)
Basophils Relative: 1 %
EOS PCT: 5 %
Eosinophils Absolute: 0.6 10*3/uL — ABNORMAL HIGH (ref 0.0–0.5)
HCT: 35.8 % — ABNORMAL LOW (ref 39.0–52.0)
HEMOGLOBIN: 11.1 g/dL — AB (ref 13.0–17.0)
Immature Granulocytes: 4 %
LYMPHS PCT: 12 %
Lymphs Abs: 1.4 10*3/uL (ref 0.7–4.0)
MCH: 24 pg — AB (ref 26.0–34.0)
MCHC: 31 g/dL (ref 30.0–36.0)
MCV: 77.5 fL — AB (ref 80.0–100.0)
MONO ABS: 1.2 10*3/uL — AB (ref 0.1–1.0)
MONOS PCT: 10 %
NEUTROS ABS: 8.1 10*3/uL — AB (ref 1.7–7.7)
Neutrophils Relative %: 68 %
Platelet Count: 146 10*3/uL — ABNORMAL LOW (ref 150–400)
RBC: 4.62 MIL/uL (ref 4.22–5.81)
RDW: 19.5 % — ABNORMAL HIGH (ref 11.5–15.5)
WBC: 12 10*3/uL — AB (ref 4.0–10.5)
nRBC: 0 % (ref 0.0–0.2)

## 2018-02-27 LAB — CMP (CANCER CENTER ONLY)
ALBUMIN: 2.6 g/dL — AB (ref 3.5–5.0)
ALT: 28 U/L (ref 0–44)
AST: 32 U/L (ref 15–41)
Alkaline Phosphatase: 158 U/L — ABNORMAL HIGH (ref 38–126)
Anion gap: 11 (ref 5–15)
BUN: 21 mg/dL (ref 8–23)
CHLORIDE: 111 mmol/L (ref 98–111)
CO2: 26 mmol/L (ref 22–32)
CREATININE: 2.24 mg/dL — AB (ref 0.61–1.24)
Calcium: 8.6 mg/dL — ABNORMAL LOW (ref 8.9–10.3)
GFR, EST NON AFRICAN AMERICAN: 28 mL/min — AB (ref >60)
GFR, Est AFR Am: 32 mL/min — ABNORMAL LOW (ref >60)
GLUCOSE: 111 mg/dL — AB (ref 70–99)
POTASSIUM: 3 mmol/L — AB (ref 3.5–5.1)
Sodium: 148 mmol/L — ABNORMAL HIGH (ref 135–145)
Total Protein: 5.3 g/dL — ABNORMAL LOW (ref 6.5–8.1)

## 2018-02-27 MED ORDER — POTASSIUM CHLORIDE ER 20 MEQ PO TBCR: 1.0000 | EXTENDED_RELEASE_TABLET | Freq: Every day | ORAL | 2 refills | Status: DC

## 2018-02-27 MED ORDER — INFLUENZA VAC SPLIT QUAD 0.5 ML IM SUSY
0.5000 mL | PREFILLED_SYRINGE | Freq: Once | INTRAMUSCULAR | Status: AC
  Administered 2018-02-27: 0.5 mL via INTRAMUSCULAR

## 2018-02-27 MED ORDER — INFLUENZA VAC SPLIT QUAD 0.5 ML IM SUSY
PREFILLED_SYRINGE | INTRAMUSCULAR | Status: AC
  Filled 2018-02-27: qty 0.5

## 2018-02-27 NOTE — Progress Notes (Signed)
[place] OFFICE PROGRESS NOTE   Diagnosis: CML  INTERVAL HISTORY:   Mr. Blanchfield returns as scheduled.  He feels well.  Good appetite.  He is exercising several days per week.  He has diarrhea once or twice per week.  He has not taken his blood pressure medication today.  He is not taking potassium.  Objective:  Vital signs in last 24 hours:  Blood pressure (!) 162/100, pulse 66, temperature 97.9 F (36.6 C), temperature source Oral, resp. rate 16, height 5\' 9"  (1.753 m), weight 141 lb 14.4 oz (64.4 kg), SpO2 98 %.    Resp: Lungs clear bilaterally Cardio: Regular rate and rhythm GI: No hepatosplenomegaly, nontender Vascular: Left leg edema   Lab Results:  Lab Results  Component Value Date   WBC 12.0 (H) 02/27/2018   HGB 11.1 (L) 02/27/2018   HCT 35.8 (L) 02/27/2018   MCV 77.5 (L) 02/27/2018   PLT 146 (L) 02/27/2018   NEUTROABS 8.1 (H) 02/27/2018    CMP  Lab Results  Component Value Date   NA 148 (H) 02/27/2018   K 3.0 (LL) 02/27/2018   CL 111 02/27/2018   CO2 26 02/27/2018   GLUCOSE 111 (H) 02/27/2018   BUN 21 02/27/2018   CREATININE 2.24 (H) 02/27/2018   CALCIUM 8.6 (L) 02/27/2018   PROT 5.3 (L) 02/27/2018   ALBUMIN 2.6 (L) 02/27/2018   AST 32 02/27/2018   ALT 28 02/27/2018   ALKPHOS 158 (H) 02/27/2018   BILITOT <0.2 (L) 02/27/2018   GFRNONAA 28 (L) 02/27/2018   GFRAA 32 (L) 02/27/2018    Medications: I have reviewed the patient's current medications.   Assessment/Plan: 1. Chronic myelogenous leukemia, diagnosed in January of 2009. He remains in hematologic remission. He is taking Gleevec at a dose of 200 mg daily. The peripheral blood PCR was markedly increased in May 2013, likely reflecting medical noncompliance. The peripheral blood PCRwas stable on 05/11/2017.  Trial of dasatinib April/May 2019; discontinued due to poor tolerance with diarrhea and nausea  Gleevec resumed May 2019  [place] placed on hold 11/02/2017  Trial of  dasatinib 50 mg daily approximately 11/29/2017  Dasatinib placed on hold 12/14/2017 2. 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 3. Status post C3-C4, C4-C5, anterior cervical diskectomy and fusion with allograft and plating 09/30/2010. 4. 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 5. Depression 6. Diarrhea, ? related to chronic pancreatitis versus Gleevec. He takes Lomotil as needed.  7. Indurated facial skin lesion 11/20/2007 with a history of MRSA skin infection of the submental area in May 2008. The induration resolved with doxycycline. 8. Left knee arthroscopy 05/25/2007. 9. Postoperative left knee effusion/pain, likely related to gout. 10. History of gout.  11. Chronic pancreatitis. 12. Status post right above-the-knee amputation. 13. MRSA infection of the submental area May 2008. 14. History of tobacco, alcohol, and cocaine use. 15. History of coronary artery disease. 16. "Shotty" lymphadenopathy of the neck, axilla, and left groin in 2009.  17. History of a microcytic anemia.  18. History of a right olecranon bursa lesion, ? gouty tophus. 19. 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 He previously took AndroGel. 20. History of anemia secondary to chronic disease and Gleevec therapy.  21. history of anorexia-potentially related to [place]. He reports Medicaid would not pay for Megace.  22. Chronic left knee and left foot pain.  23. Status post removal of a left knee screw 07/03/2012. 24. History of hypokalemia. Likely  related to diarrhea.He is on a potassium supplement. 25. Status post left foot surgery. 26. Pulsatile fullness right neck. Evaluated by vascular surgery status post CT angiogram 03/07/2014 with no carotid artery aneurysm identified. 27. Cough-abnormal lung exam 05/12/2015. He completed a course of Levaquin. Chest xray negative. 28. Hospitalization 09/20/2016 through 10/07/2016 with  unresponsiveness/acute metabolic encephalopathy 29. Upper endoscopy 11/21/2017-normal esophagus; erythematous mucosa in the antrum which was biopsied; normal examined duodenum.    Disposition: Mr. Cypert appears well.  The white count is higher today.  We will follow-up on the peripheral blood PCR.  I am concerned the CML is progressing.  He says he will agree to resume Gleevec if the peripheral blood PCR continues to rise.  He will return for an office visit and a repeat CBC in 3 weeks.  Mr. Hyndman has renal insufficiency.  We will forward the chemistry panel to Dr. Jeanie Cooks recommend any change in the antihypertensive regimen.  He will resume a potassium supplement.  Mr. Wrede received an influenza vaccine today.    [name], MD  02/27/2018  4:07 PM

## 2018-02-27 NOTE — Telephone Encounter (Signed)
Appts scheduled avs declined/ calendar printed per 10/22 los °

## 2018-03-04 ENCOUNTER — Other Ambulatory Visit: Payer: Self-pay

## 2018-03-04 ENCOUNTER — Emergency Department (HOSPITAL_COMMUNITY)
Admission: EM | Admit: 2018-03-04 | Discharge: 2018-03-04 | Disposition: A | Discharge status: Home / Self Care | Payer: Medicare Other | Attending: Emergency Medicine | Admitting: Emergency Medicine

## 2018-03-04 ENCOUNTER — Encounter (HOSPITAL_COMMUNITY): Payer: Self-pay | Admitting: Emergency Medicine

## 2018-03-04 ENCOUNTER — Emergency Department (HOSPITAL_COMMUNITY): Payer: Medicare Other

## 2018-03-04 DIAGNOSIS — M79602 Pain in left arm: Secondary | ICD-10-CM | POA: Insufficient documentation

## 2018-03-04 DIAGNOSIS — Z5321 Procedure and treatment not carried out due to patient leaving prior to being seen by health care provider: Secondary | ICD-10-CM | POA: Diagnosis not present

## 2018-03-04 DIAGNOSIS — R200 Anesthesia of skin: Secondary | ICD-10-CM | POA: Diagnosis not present

## 2018-03-04 NOTE — ED Triage Notes (Signed)
Patient is complaining of left arm numbness and pain below the elbow. Patient states it has been going on for 2 days.

## 2018-03-05 ENCOUNTER — Other Ambulatory Visit: Payer: Self-pay | Admitting: Oncology

## 2018-03-05 ENCOUNTER — Other Ambulatory Visit: Payer: Self-pay | Admitting: *Deleted

## 2018-03-05 DIAGNOSIS — C9210 Chronic myeloid leukemia, BCR/ABL-positive, not having achieved remission: Secondary | ICD-10-CM

## 2018-03-05 MED ORDER — DIPHENOXYLATE-ATROPINE 2.5-0.025 MG PO TABS: ORAL_TABLET | ORAL | 0 refills | Status: DC

## 2018-03-07 LAB — BCR/ABL

## 2018-03-14 ENCOUNTER — Inpatient Hospital Stay: Admission: RE | Admit: 2018-03-14 | Payer: Self-pay | Source: Ambulatory Visit

## 2018-03-14 ENCOUNTER — Other Ambulatory Visit: Payer: Self-pay | Admitting: *Deleted

## 2018-03-14 DIAGNOSIS — C9210 Chronic myeloid leukemia, BCR/ABL-positive, not having achieved remission: Secondary | ICD-10-CM

## 2018-03-14 MED ORDER — DIPHENOXYLATE-ATROPINE 2.5-0.025 MG PO TABS: ORAL_TABLET | ORAL | 0 refills | Status: DC

## 2018-03-21 ENCOUNTER — Encounter: Payer: Self-pay | Admitting: Nurse Practitioner

## 2018-03-21 ENCOUNTER — Telehealth: Payer: Self-pay

## 2018-03-21 ENCOUNTER — Inpatient Hospital Stay (HOSPITAL_BASED_OUTPATIENT_CLINIC_OR_DEPARTMENT_OTHER): Payer: Medicare Other | Admitting: Nurse Practitioner

## 2018-03-21 ENCOUNTER — Inpatient Hospital Stay: Payer: Medicare Other | Attending: Nurse Practitioner

## 2018-03-21 VITALS — BP 184/99 | HR 74 | Temp 98.6°F | Resp 18 | Ht 69.0 in | Wt 136.6 lb

## 2018-03-21 DIAGNOSIS — Z87891 Personal history of nicotine dependence: Secondary | ICD-10-CM | POA: Diagnosis not present

## 2018-03-21 DIAGNOSIS — C9211 Chronic myeloid leukemia, BCR/ABL-positive, in remission: Secondary | ICD-10-CM

## 2018-03-21 DIAGNOSIS — I2510 Atherosclerotic heart disease of native coronary artery without angina pectoris: Secondary | ICD-10-CM | POA: Insufficient documentation

## 2018-03-21 DIAGNOSIS — Z79899 Other long term (current) drug therapy: Secondary | ICD-10-CM

## 2018-03-21 DIAGNOSIS — Z8614 Personal history of Methicillin resistant Staphylococcus aureus infection: Secondary | ICD-10-CM | POA: Insufficient documentation

## 2018-03-21 DIAGNOSIS — C9210 Chronic myeloid leukemia, BCR/ABL-positive, not having achieved remission: Secondary | ICD-10-CM

## 2018-03-21 LAB — CMP (CANCER CENTER ONLY)
ALBUMIN: 2.3 g/dL — AB (ref 3.5–5.0)
ALK PHOS: 145 U/L — AB (ref 38–126)
ALT: 20 U/L (ref 0–44)
AST: 25 U/L (ref 15–41)
Anion gap: 8 (ref 5–15)
BUN: 20 mg/dL (ref 8–23)
CALCIUM: 8.2 mg/dL — AB (ref 8.9–10.3)
CHLORIDE: 109 mmol/L (ref 98–111)
CO2: 27 mmol/L (ref 22–32)
Creatinine: 2.44 mg/dL — ABNORMAL HIGH (ref 0.61–1.24)
GFR, EST AFRICAN AMERICAN: 29 mL/min — AB (ref >60)
GFR, Estimated: 25 mL/min — ABNORMAL LOW (ref >60)
GLUCOSE: 114 mg/dL — AB (ref 70–99)
Potassium: 3.3 mmol/L — ABNORMAL LOW (ref 3.5–5.1)
SODIUM: 144 mmol/L (ref 135–145)
Total Bilirubin: <0.2 mg/dL — ABNORMAL LOW (ref 0.3–1.2)
Total Protein: 5.2 g/dL — ABNORMAL LOW (ref 6.5–8.1)

## 2018-03-21 LAB — CBC WITH DIFFERENTIAL (CANCER CENTER ONLY)
Basophils Absolute: 0.1 10*3/uL (ref 0.0–0.1)
Basophils Relative: 1 %
EOS PCT: 5 %
Eosinophils Absolute: 0.5 10*3/uL (ref 0.0–0.5)
HCT: 34.5 % — ABNORMAL LOW (ref 39.0–52.0)
Hemoglobin: 10.8 g/dL — ABNORMAL LOW (ref 13.0–17.0)
LYMPHS ABS: 2 10*3/uL (ref 0.7–4.0)
LYMPHS PCT: 16 %
MCH: 24.7 pg — ABNORMAL LOW (ref 26.0–34.0)
MCHC: 31.3 g/dL (ref 30.0–36.0)
MCV: 78.8 fL — ABNORMAL LOW (ref 80.0–100.0)
Monocytes Absolute: 1.4 10*3/uL — ABNORMAL HIGH (ref 0.1–1.0)
Monocytes Relative: 12 %
Neutro Abs: 7.6 10*3/uL (ref 1.7–7.7)
Neutrophils Relative %: 66 %
Platelet Count: 162 10*3/uL (ref 150–400)
RBC: 4.38 MIL/uL (ref 4.22–5.81)
RDW: 20.6 % — ABNORMAL HIGH (ref 11.5–15.5)
WBC Count: 11.6 10*3/uL — ABNORMAL HIGH (ref 4.0–10.5)
nRBC: 0 % (ref 0.0–0.2)

## 2018-03-21 NOTE — Progress Notes (Signed)
[name] OFFICE PROGRESS NOTE   Diagnosis: CML  INTERVAL HISTORY:   [name] returns as scheduled.  He overall is feeling well.  No fever, sweats or chills.  He reports a good appetite.  No abdominal pain.  Minimal nausea.  No diarrhea.  Objective:  Vital signs in last 24 hours:  Blood pressure (!) 184/99, pulse 74, temperature 98.6 F (37 C), temperature source Oral, resp. rate 18, height 5\' 9"  (1.753 m), weight 136 lb 9.6 oz (62 kg), SpO2 91 %.    HEENT: Mild white coating over tongue.  No buccal thrush. Resp: Lungs clear bilaterally. Cardio: Regular rate and rhythm. GI: Abdomen soft and nontender.  No splenomegaly. Vascular: No left leg edema.  Lab Results:  Lab Results  Component Value Date   WBC 11.6 (H) 03/21/2018   HGB 10.8 (L) 03/21/2018   HCT 34.5 (L) 03/21/2018   MCV 78.8 (L) 03/21/2018   PLT 162 03/21/2018   NEUTROABS PENDING 03/21/2018    Imaging:  No results found.  Medications: I have reviewed the patient's current medications.  Assessment/Plan: 1. Chronic myelogenous leukemia, diagnosed in January of 2009. He remains in hematologic remission. He is taking Gleevec at a dose of 200 mg daily. The peripheral blood PCR was markedly increased in May 2013, likely reflecting medical noncompliance. The peripheral blood PCRwas stable on 05/11/2017.  Trial of dasatinib April/May 2019; discontinued due to poor tolerance with diarrhea and nausea  Gleevec resumed May 2019  [place] placed on hold 11/02/2017  Trial of dasatinib 50 mg daily approximately 11/29/2017  Dasatinib placed on hold 12/14/2017  Gleevec 100 mg daily beginning 03/21/2018 2. 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 3. Status post C3-C4, C4-C5, anterior cervical diskectomy and fusion with allograft and plating 09/30/2010. 4. 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 5. Depression 6. Diarrhea, ?  related to chronic pancreatitis versus Gleevec. He takes Lomotil as needed.  7. Indurated facial skin lesion 11/20/2007 with a history of MRSA skin infection of the submental area in May 2008. The induration resolved with doxycycline. 8. Left knee arthroscopy 05/25/2007. 9. Postoperative left knee effusion/pain, likely related to gout. 10. History of gout.  11. Chronic pancreatitis. 12. Status post right above-the-knee amputation. 13. MRSA infection of the submental area May 2008. 14. History of tobacco, alcohol, and cocaine use. 15. History of coronary artery disease. 16. "Shotty" lymphadenopathy of the neck, axilla, and left groin in 2009.  17. History of a microcytic anemia.  18. History of a right olecranon bursa lesion, ? gouty tophus. 19. 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 He previously took AndroGel. 20. History of anemia secondary to chronic disease and Gleevec therapy.  21. history of anorexia-potentially related to [place]. He reports Medicaid would not pay for Megace.  22. Chronic left knee and left foot pain.  23. Status post removal of a left knee screw 07/03/2012. 24. History of hypokalemia. Likely related to diarrhea.He is on a potassium supplement. 25. Status post left foot surgery. 26. Pulsatile fullness right neck. Evaluated by vascular surgery status post CT angiogram 03/07/2014 with no carotid artery aneurysm identified. 27. Cough-abnormal lung exam 05/12/2015. He completed a course of Levaquin. Chest xray negative. 28. Hospitalization 09/20/2016 through 10/07/2016 with unresponsiveness/acute metabolic encephalopathy 29. Upper endoscopy 11/21/2017-normal esophagus; erythematous mucosa in the antrum which was biopsied; normal examined duodenum.   Disposition: [name] appears unchanged.  The peripheral blood PCR was increased 02/27/2018.  He  understands the CML is likely progressing.  We discussed resuming Gleevec.  Unfortunately his renal function is borderline  for taking Gleevec.  We made a referral to nephrology to evaluate the renal failure.  He understands that he should not resume Gleevec until we see him in follow-up.  We scheduled a return visit with repeat labs in 3 weeks.  He will contact the office in the interim with any problems.  Plan reviewed with Dr. [name].    [name] ANP/GNP-BC   03/21/2018  3:41 PM

## 2018-03-21 NOTE — Telephone Encounter (Signed)
Printed avs and calender of upcoming appointment. Per 11/13 los 

## 2018-03-22 ENCOUNTER — Telehealth: Payer: Self-pay | Admitting: Pharmacist

## 2018-03-22 NOTE — Telephone Encounter (Signed)
Oral Oncology Pharmacist Encounter  Received referral for treatment for chronic myeloid leukemia for patient who appears to have progressive disease. Patient has been on treatment break since 12/14/17 Patient placed on treatment with [place] (imatinib) in 2013 for his CML. This was changed to Sprycel (dasatinib) in March 2019 due to progressive, intolerable, adverse effects including nausea, vomiting and diarrhea. Patient experienced nausea and diarrhea with Sprycel despite dose reductions and was discontinued in May 2019. Re-challenge of Gleevec May-June 2019 with poor tolerance. Re-challenge of Sprycel in July 2019, again with poor tolerance, and all treatment was discontinued in August 2019.  Patient seen in the office yesterday (03/21/18).  CML appears to be progressing. Renal function is worsened, renal consult has been placed. I have been asked to work patient up for [facility] treatment options given extensive medication list, past medical history, and current acute issues.  Labs from 03/21/18 reviewed  Noted worsened renal function, SCr=2.44, est CrCl ~ 20 mL/min Baseline SCr ~ 1.6-2.0, baseline CrCl 25 - 35 mL/min  10/01/16 ECHO shows LVEF 75-91%, grade 1 diastolic dysfunction  63/84/66 EKG shows QTc 489 msec 02/08/18 EKG shows QTc 520 msec 10/06/17 EKG shows QTc 484 msec  Bosulif (bosutinib) recommendation to reduce dose to 200 mg daily for CrCl < 30 mL/min prior to treatment initiation, may be able to increase to 300mg  daily if tolerated without issue  Diarrhea 70-85% (median time to onset = 3 days, median duration of symptoms = 3 days)  Skin rash 35-40%  Nausea 35-45%  Edema 15-20%  QTc prolongation (>500 msec) 1-10%  Acute pancreatitis <1%  Avoid grapefruit and grapefruit juice  No medication interactions with any medications on current list in Epic  Moderate emetogenic potential  Administer with food  Sprycel (dasatinib) does not have any dose adjustments recommended  for renal dysfunction  Diarrhea 20-30%  Skin rash 10-20%  Nausea 10-20%  Edema 20%, pleural/percardial effusion 10%  Cardiac conduction disturbance 7%, QTc prolongation <1%  Avoid grapefruit and grapefruit juice  Medication interactions with Sprycel and acetaminophen due to increased risk of hepatotoxicity  Avoid acid suppressing agents such as PPIs and H2RAs, can separate antacids  Administer with food  Tasigna (nilotinib) does not have any dose adjustments recommended for renal dysfunction  Diarrhea 20-30%  Nausea 25-35%  Skin rash 30-35%  QTc prolongation >7msec 25%, > 73msec 4%  Peripheral edema 10-15%  Increased lipase 25%  Ischemic heart disease 9%  Cough 25%  Avoid grapefruit and grapefruit juice  Contraindicated in [place] labeling with QTc > 420msec  Category D interaction with colchicine as Tasigna is a moderate CYP3A4 inhibitor, likely increasing systemic exposure to colchicine  Category C interactions with amlodipine and oxycodone (CYP3A4), increasing systemic exposure to amlodipine and oxycodone  Administer BID on an empty stomach  Gleevec (imatinib) CrCl <20 mL/minute: Use caution; a dose of 100 mg daily has been tolerated in a limited number of patients with severe impairment   Diarrhea 25-60%  Nausea 40-70%  Skin rash 10-50%  Fluid retention 5-75%  Edema 10-85%  Peripheral edema 20-40%  Heart failure and left ventricular dysfunction have occasionally been reported  Avoid grapefruit and grapefruit juice  Moderate emetogenic potential  Administer with food  Above will be discussed with MD for treatment selection. All agents with multiple, possible adverse events. Some differing rates of GI toxicities. Bosulif appears to carry highest incidence of diarrhea. Tasigna with greatest risk of QTc prolongation.  Johny Drilling, PharmD, BCPS, BCOP  03/22/2018 7:47 AM Oral Oncology  Clinic [phone]

## 2018-03-23 ENCOUNTER — Other Ambulatory Visit: Payer: Self-pay | Admitting: *Deleted

## 2018-03-23 DIAGNOSIS — C9210 Chronic myeloid leukemia, BCR/ABL-positive, not having achieved remission: Secondary | ICD-10-CM

## 2018-03-23 MED ORDER — POTASSIUM CHLORIDE CRYS ER 20 MEQ PO TBCR: 20.0000 meq | EXTENDED_RELEASE_TABLET | Freq: Every day | ORAL | 1 refills | Status: DC

## 2018-03-27 ENCOUNTER — Telehealth: Payer: Self-pay | Admitting: *Deleted

## 2018-03-27 NOTE — Telephone Encounter (Signed)
Called to ask what he can do to protect his kidneys while waiting to see the nephrologist. Has not heard about an appointment yet. Informed him to push water, avoid salt and high protein, limit use of ibuprofen and use tylenol instead for pain. Will follow up tomorrow regarding referral.

## 2018-04-03 ENCOUNTER — Telehealth: Payer: Self-pay | Admitting: *Deleted

## 2018-04-03 NOTE — Telephone Encounter (Signed)
Patient reports he has not heard from [facility] regarding his referral. Called office and Amber reports the referral is in review by physician for "acuity rating". Should have rating by next week. Will follow up on 04/09/18.

## 2018-04-09 ENCOUNTER — Telehealth: Payer: Self-pay | Admitting: *Deleted

## 2018-04-09 NOTE — Telephone Encounter (Signed)
Spoke with Hilda Blades at [facility] and MD has reviewed his chart for acuity and has been ranked "2" which means he will be called this week with an appointment. Called patient and informed him and instructed him to call if he does not hear from them this week.

## 2018-04-12 ENCOUNTER — Telehealth: Payer: Self-pay | Admitting: *Deleted

## 2018-04-12 ENCOUNTER — Inpatient Hospital Stay: Payer: Medicare Other | Admitting: Nurse Practitioner

## 2018-04-12 ENCOUNTER — Inpatient Hospital Stay: Payer: Medicare Other

## 2018-04-12 NOTE — Telephone Encounter (Signed)
Spoke with patient regarding referral to [facility]. Per [employer] patient scheduled an appointment with Carolin Sicks, MD on 04/17/18 at 3 am. Patient verbalized understanding.

## 2018-04-16 ENCOUNTER — Emergency Department (HOSPITAL_COMMUNITY): Payer: Medicare Other

## 2018-04-16 ENCOUNTER — Encounter (HOSPITAL_COMMUNITY): Payer: Self-pay | Admitting: Emergency Medicine

## 2018-04-16 ENCOUNTER — Other Ambulatory Visit: Payer: Self-pay

## 2018-04-16 ENCOUNTER — Emergency Department (HOSPITAL_COMMUNITY)
Admission: EM | Admit: 2018-04-16 | Discharge: 2018-04-16 | Discharge status: Absent without leave | Payer: Medicare Other | Source: Home / Self Care | Attending: Emergency Medicine | Admitting: Emergency Medicine

## 2018-04-16 ENCOUNTER — Emergency Department (HOSPITAL_COMMUNITY)
Admission: EM | Admit: 2018-04-16 | Discharge: 2018-04-16 | Disposition: A | Discharge status: Home / Self Care | Payer: Medicare Other | Attending: Emergency Medicine | Admitting: Emergency Medicine

## 2018-04-16 DIAGNOSIS — I10 Essential (primary) hypertension: Secondary | ICD-10-CM | POA: Insufficient documentation

## 2018-04-16 DIAGNOSIS — R1084 Generalized abdominal pain: Secondary | ICD-10-CM | POA: Insufficient documentation

## 2018-04-16 DIAGNOSIS — I252 Old myocardial infarction: Secondary | ICD-10-CM | POA: Diagnosis not present

## 2018-04-16 DIAGNOSIS — K5900 Constipation, unspecified: Secondary | ICD-10-CM | POA: Insufficient documentation

## 2018-04-16 DIAGNOSIS — Z79899 Other long term (current) drug therapy: Secondary | ICD-10-CM | POA: Insufficient documentation

## 2018-04-16 DIAGNOSIS — F17210 Nicotine dependence, cigarettes, uncomplicated: Secondary | ICD-10-CM | POA: Insufficient documentation

## 2018-04-16 LAB — CBC WITH DIFFERENTIAL/PLATELET
BAND NEUTROPHILS: 0 %
BASOS ABS: 0.5 10*3/uL — AB (ref 0.0–0.1)
Basophils Relative: 3 %
Blasts: 0 %
EOS ABS: 0.6 10*3/uL — AB (ref 0.0–0.5)
EOS PCT: 4 %
HCT: 34.5 % — ABNORMAL LOW (ref 39.0–52.0)
HEMATOCRIT: 36.9 % — AB (ref 39.0–52.0)
HEMOGLOBIN: 10.9 g/dL — AB (ref 13.0–17.0)
Hemoglobin: 10.5 g/dL — ABNORMAL LOW (ref 13.0–17.0)
LYMPHS ABS: 0.9 10*3/uL (ref 0.7–4.0)
Lymphocytes Relative: 6 %
MCH: 23.4 pg — ABNORMAL LOW (ref 26.0–34.0)
MCH: 23.8 pg — AB (ref 26.0–34.0)
MCHC: 29.5 g/dL — AB (ref 30.0–36.0)
MCHC: 30.4 g/dL (ref 30.0–36.0)
MCV: 79.4 fL — ABNORMAL LOW (ref 80.0–100.0)
MCV: 78.2 fL — ABNORMAL LOW (ref 80.0–100.0)
METAMYELOCYTES PCT: 0 %
MONO ABS: 0.6 10*3/uL (ref 0.1–1.0)
MONOS PCT: 4 %
Myelocytes: 2 %
NEUTROS ABS: 12.4 10*3/uL — AB (ref 1.7–7.7)
NRBC: 0 % (ref 0.0–0.2)
Neutrophils Relative %: 81 %
Other: 0 %
PLATELETS: 215 10*3/uL (ref 150–400)
Platelets: 247 10*3/uL (ref 150–400)
Promyelocytes Relative: 0 %
RBC: 4.65 MIL/uL (ref 4.22–5.81)
RBC: 4.41 MIL/uL (ref 4.22–5.81)
RDW: 18.4 % — ABNORMAL HIGH (ref 11.5–15.5)
RDW: 18.2 % — AB (ref 11.5–15.5)
WBC: 15.9 10*3/uL — ABNORMAL HIGH (ref 4.0–10.5)
WBC: 15 10*3/uL — ABNORMAL HIGH (ref 4.0–10.5)
nRBC: 0 % (ref 0.0–0.2)
nRBC: 0 /100 WBC

## 2018-04-16 LAB — URINALYSIS, ROUTINE W REFLEX MICROSCOPIC
BILIRUBIN URINE: NEGATIVE
Glucose, UA: NEGATIVE mg/dL
Ketones, ur: NEGATIVE mg/dL
Leukocytes, UA: NEGATIVE
Nitrite: NEGATIVE
Protein, ur: >300 mg/dL — AB
SPECIFIC GRAVITY, URINE: 1.02 (ref 1.005–1.030)
pH: 6.5 (ref 5.0–8.0)

## 2018-04-16 LAB — COMPREHENSIVE METABOLIC PANEL
ALK PHOS: 109 U/L (ref 38–126)
ALT: 11 U/L (ref 0–44)
AST: 17 U/L (ref 15–41)
Albumin: 2.2 g/dL — ABNORMAL LOW (ref 3.5–5.0)
Anion gap: 11 (ref 5–15)
BUN: 24 mg/dL — AB (ref 8–23)
CHLORIDE: 107 mmol/L (ref 98–111)
CO2: 25 mmol/L (ref 22–32)
CREATININE: 2.95 mg/dL — AB (ref 0.61–1.24)
Calcium: 8.5 mg/dL — ABNORMAL LOW (ref 8.9–10.3)
GFR calc Af Amer: 23 mL/min — ABNORMAL LOW (ref >60)
GFR, EST NON AFRICAN AMERICAN: 20 mL/min — AB (ref >60)
Glucose, Bld: 98 mg/dL (ref 70–99)
Potassium: 2.7 mmol/L — CL (ref 3.5–5.1)
Sodium: 143 mmol/L (ref 135–145)
Total Bilirubin: 0.5 mg/dL (ref 0.3–1.2)
Total Protein: 5.7 g/dL — ABNORMAL LOW (ref 6.5–8.1)

## 2018-04-16 LAB — I-STAT VENOUS BLOOD GAS, ED
Acid-base deficit: 1 mmol/L (ref 0.0–2.0)
Bicarbonate: 25.1 mmol/L (ref 20.0–28.0)
O2 Saturation: 99 %
TCO2: 26 mmol/L (ref 22–32)
pCO2, Ven: 44.6 mmHg (ref 44.0–60.0)
pH, Ven: 7.357 (ref 7.250–7.430)
pO2, Ven: 143 mmHg — ABNORMAL HIGH (ref 32.0–45.0)

## 2018-04-16 LAB — URINALYSIS, MICROSCOPIC (REFLEX): Bacteria, UA: NONE SEEN

## 2018-04-16 LAB — TROPONIN I: Troponin I: 0.03 ng/mL (ref <0.03)

## 2018-04-16 LAB — LIPASE, BLOOD: Lipase: 41 U/L (ref 11–51)

## 2018-04-16 LAB — POC OCCULT BLOOD, ED: Fecal Occult Bld: NEGATIVE

## 2018-04-16 MED ORDER — METOPROLOL SUCCINATE ER 100 MG PO TB24
100.0000 mg | ORAL_TABLET | Freq: Every day | ORAL | Status: DC
  Administered 2018-04-16: 100 mg via ORAL
  Filled 2018-04-16: qty 1

## 2018-04-16 MED ORDER — BISACODYL 10 MG RE SUPP: 10.0000 mg | Freq: Once | RECTAL | Status: DC

## 2018-04-16 MED ORDER — POTASSIUM CHLORIDE CRYS ER 20 MEQ PO TBCR
40.0000 meq | EXTENDED_RELEASE_TABLET | Freq: Once | ORAL | Status: AC
  Administered 2018-04-16: 40 meq via ORAL
  Filled 2018-04-16: qty 2

## 2018-04-16 MED ORDER — AMLODIPINE BESYLATE 5 MG PO TABS
10.0000 mg | ORAL_TABLET | Freq: Every day | ORAL | Status: DC
  Administered 2018-04-16: 10 mg via ORAL
  Filled 2018-04-16: qty 2

## 2018-04-16 MED ORDER — HYDROCHLOROTHIAZIDE 25 MG PO TABS
25.0000 mg | ORAL_TABLET | Freq: Every day | ORAL | Status: DC
  Administered 2018-04-16: 25 mg via ORAL
  Filled 2018-04-16: qty 1

## 2018-04-16 MED ORDER — HYDRALAZINE HCL 25 MG PO TABS
75.0000 mg | ORAL_TABLET | Freq: Three times a day (TID) | ORAL | Status: DC
  Administered 2018-04-16: 75 mg via ORAL
  Filled 2018-04-16: qty 3

## 2018-04-16 MED ORDER — LOSARTAN POTASSIUM-HCTZ 100-25 MG PO TABS: 1.0000 | ORAL_TABLET | Freq: Every day | ORAL | Status: DC

## 2018-04-16 MED ORDER — LOSARTAN POTASSIUM 50 MG PO TABS
100.0000 mg | ORAL_TABLET | Freq: Every day | ORAL | Status: DC
  Administered 2018-04-16: 100 mg via ORAL
  Filled 2018-04-16: qty 2

## 2018-04-16 MED ORDER — POTASSIUM CHLORIDE 10 MEQ/100ML IV SOLN
10.0000 meq | INTRAVENOUS | Status: AC
  Administered 2018-04-16 (×3): 10 meq via INTRAVENOUS
  Filled 2018-04-16 (×3): qty 100

## 2018-04-16 NOTE — ED Notes (Signed)
Patient transported to CT 

## 2018-04-16 NOTE — Discharge Instructions (Addendum)
To treat your constipation we gave you a suppository that should promote a good bowel movement.  To continue the treatment take MiraLAX once or twice a day to [place] daily soft stools.  Sure that you drink plenty of water and have a lot of fiber in your diet.  All of your medicines as prescribed including your blood pressure medicines.  Low up with your primary care provider in a week or so for a checkup.
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
## 2018-04-16 NOTE — ED Triage Notes (Signed)
Pt arrives to ED from home with complaints of a near syncopal episode when pt was trying to use the bathroom. Pt reports hes been constipated 3-4 days.  Pt placed in position of comfort with bed locked and lowered, call bell in reach.

## 2018-04-16 NOTE — ED Triage Notes (Signed)
To ED via [facility] from home, with c/o abd pain and constipation- last BM 3-4 days ago, also requests "can you check me for cancer"

## 2018-04-16 NOTE — ED Notes (Signed)
Pt stated that his name is [name] and not [name]. That [name] is his twins name. Pt stated he did not realize that we were saying [name] this whole time. This RN notified registation to fix the problem. Registration at bedside.

## 2018-04-17 LAB — PATHOLOGIST SMEAR REVIEW

## 2018-04-17 LAB — BCR/ABL

## 2018-04-18 ENCOUNTER — Inpatient Hospital Stay: Payer: Medicare Other

## 2018-04-18 ENCOUNTER — Telehealth: Payer: Self-pay | Admitting: Nurse Practitioner

## 2018-04-18 ENCOUNTER — Other Ambulatory Visit (INDEPENDENT_AMBULATORY_CARE_PROVIDER_SITE_OTHER): Payer: Self-pay

## 2018-04-18 ENCOUNTER — Other Ambulatory Visit (INDEPENDENT_AMBULATORY_CARE_PROVIDER_SITE_OTHER): Payer: Self-pay | Admitting: Orthopaedic Surgery

## 2018-04-18 ENCOUNTER — Inpatient Hospital Stay: Payer: Medicare Other | Admitting: Nurse Practitioner

## 2018-04-18 MED ORDER — COLCHICINE 0.6 MG PO TABS: 0.6000 mg | ORAL_TABLET | Freq: Two times a day (BID) | ORAL | 1 refills | Status: DC

## 2018-04-18 NOTE — Telephone Encounter (Signed)
Scheduled appt per 12/11 sch message - pt is aware of appt date and time   

## 2018-04-18 NOTE — Telephone Encounter (Signed)
Please advise 

## 2018-04-25 ENCOUNTER — Telehealth: Payer: Self-pay | Admitting: Pharmacist

## 2018-04-25 ENCOUNTER — Inpatient Hospital Stay (HOSPITAL_BASED_OUTPATIENT_CLINIC_OR_DEPARTMENT_OTHER): Payer: Medicare Other | Admitting: Nurse Practitioner

## 2018-04-25 ENCOUNTER — Encounter: Payer: Self-pay | Admitting: Nurse Practitioner

## 2018-04-25 ENCOUNTER — Telehealth: Payer: Self-pay

## 2018-04-25 ENCOUNTER — Inpatient Hospital Stay: Payer: Medicare Other | Attending: Nurse Practitioner

## 2018-04-25 VITALS — BP 193/95 | HR 76 | Temp 98.3°F | Resp 18 | Ht 69.0 in | Wt 140.8 lb

## 2018-04-25 DIAGNOSIS — Z87891 Personal history of nicotine dependence: Secondary | ICD-10-CM | POA: Diagnosis not present

## 2018-04-25 DIAGNOSIS — C9210 Chronic myeloid leukemia, BCR/ABL-positive, not having achieved remission: Secondary | ICD-10-CM

## 2018-04-25 DIAGNOSIS — F329 Major depressive disorder, single episode, unspecified: Secondary | ICD-10-CM

## 2018-04-25 DIAGNOSIS — C9212 Chronic myeloid leukemia, BCR/ABL-positive, in relapse: Secondary | ICD-10-CM | POA: Insufficient documentation

## 2018-04-25 LAB — CBC WITH DIFFERENTIAL (CANCER CENTER ONLY)
Abs Immature Granulocytes: 1.44 10*3/uL — ABNORMAL HIGH (ref 0.00–0.07)
BASOS ABS: 0.1 10*3/uL (ref 0.0–0.1)
Basophils Relative: 1 %
Eosinophils Absolute: 0.5 10*3/uL (ref 0.0–0.5)
Eosinophils Relative: 3 %
HCT: 31.9 % — ABNORMAL LOW (ref 39.0–52.0)
Hemoglobin: 10 g/dL — ABNORMAL LOW (ref 13.0–17.0)
Immature Granulocytes: 11 %
Lymphocytes Relative: 9 %
Lymphs Abs: 1.3 10*3/uL (ref 0.7–4.0)
MCH: 24.6 pg — ABNORMAL LOW (ref 26.0–34.0)
MCHC: 31.3 g/dL (ref 30.0–36.0)
MCV: 78.4 fL — ABNORMAL LOW (ref 80.0–100.0)
Monocytes Absolute: 1.3 10*3/uL — ABNORMAL HIGH (ref 0.1–1.0)
Monocytes Relative: 10 %
NEUTROS PCT: 66 %
NRBC: 0 % (ref 0.0–0.2)
Neutro Abs: 9 10*3/uL — ABNORMAL HIGH (ref 1.7–7.7)
Platelet Count: 227 10*3/uL (ref 150–400)
RBC: 4.07 MIL/uL — AB (ref 4.22–5.81)
RDW: 18.8 % — ABNORMAL HIGH (ref 11.5–15.5)
WBC: 13.6 10*3/uL — AB (ref 4.0–10.5)

## 2018-04-25 LAB — CMP (CANCER CENTER ONLY)
ALT: 17 U/L (ref 0–44)
ANION GAP: 9 (ref 5–15)
AST: 23 U/L (ref 15–41)
Albumin: 2.3 g/dL — ABNORMAL LOW (ref 3.5–5.0)
Alkaline Phosphatase: 158 U/L — ABNORMAL HIGH (ref 38–126)
BUN: 24 mg/dL — ABNORMAL HIGH (ref 8–23)
CO2: 22 mmol/L (ref 22–32)
Calcium: 8.7 mg/dL — ABNORMAL LOW (ref 8.9–10.3)
Chloride: 111 mmol/L (ref 98–111)
Creatinine: 2.43 mg/dL — ABNORMAL HIGH (ref 0.61–1.24)
GFR, EST AFRICAN AMERICAN: 30 mL/min — AB (ref >60)
GFR, Estimated: 26 mL/min — ABNORMAL LOW (ref >60)
Glucose, Bld: 146 mg/dL — ABNORMAL HIGH (ref 70–99)
Potassium: 3.8 mmol/L (ref 3.5–5.1)
Sodium: 142 mmol/L (ref 135–145)
Total Bilirubin: 0.2 mg/dL — ABNORMAL LOW (ref 0.3–1.2)
Total Protein: 5.7 g/dL — ABNORMAL LOW (ref 6.5–8.1)

## 2018-04-25 MED ORDER — BOSUTINIB 100 MG PO TABS: 200.0000 mg | ORAL_TABLET | Freq: Every day | ORAL | 0 refills | Status: DC

## 2018-04-25 NOTE — Telephone Encounter (Signed)
Printed avs and calender of upcoming appointment. Per 12/18 los 

## 2018-04-25 NOTE — Progress Notes (Addendum)
[facility] OFFICE PROGRESS NOTE   Diagnosis: CML  INTERVAL HISTORY:   [name] returns for follow-up.  He reports a poor appetite.  No nausea or vomiting.  No diarrhea.  No rash.  No abdominal pain.  Main complaint is perianal pruritus and discomfort.  He is concerned he has prostate cancer.  Objective:  Vital signs in last 24 hours:  Blood pressure (!) 193/95, pulse 76, temperature 98.3 F (36.8 C), temperature source Oral, resp. rate 18, height '5\' 9"'  (1.753 m), weight 140 lb 12.8 oz (63.9 kg), SpO2 100 %.    HEENT: No thrush or ulcers. Resp: Lungs clear bilaterally. Cardio: Regular rate and rhythm. GI: Abdomen soft and nontender.  No hepatosplenomegaly. Vascular: No left leg edema.   Lab Results:  Lab Results  Component Value Date   WBC 13.6 (H) 04/25/2018   HGB 10.0 (L) 04/25/2018   HCT 31.9 (L) 04/25/2018   MCV 78.4 (L) 04/25/2018   PLT 227 04/25/2018   NEUTROABS PENDING 04/25/2018    Imaging:  No results found.  Medications: I have reviewed the patient's current medications.  Assessment/Plan: 1. Chronic myelogenous leukemia, diagnosed in January of 2009. He remains in hematologic remission. He is taking Gleevec at a dose of 200 mg daily. The peripheral blood PCR was markedly increased in May 2013, likely reflecting medical noncompliance. The peripheral blood PCRwas stable on 05/11/2017.  Trial of dasatinib April/May 2019; discontinued due to poor tolerance with diarrhea and nausea  Gleevec resumed May 2019  [place] placed on hold 11/02/2017  Trial of dasatinib 50 mg daily approximately 11/29/2017  Dasatinib placed on hold 12/14/2017  Gleevec 100 mg daily beginning 03/21/2018  Bosutinib 200 mg daily beginning 04/30/2018 2. 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 3. Status post C3-C4, C4-C5, anterior cervical diskectomy and fusion with allograft and plating 09/30/2010. 4. 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 5. Depression 6. Diarrhea, ? related to chronic pancreatitis versus Gleevec. He takes Lomotil as needed.  7. Indurated facial skin lesion 11/20/2007 with a history of MRSA skin infection of the submental area in May 2008. The induration resolved with doxycycline. 8. Left knee arthroscopy 05/25/2007. 9. Postoperative left knee effusion/pain, likely related to gout. 10. History of gout.  11. Chronic pancreatitis. 12. Status post right above-the-knee amputation. 13. MRSA infection of the submental area May 2008. 14. History of tobacco, alcohol, and cocaine use. 15. History of coronary artery disease. 16. "Shotty" lymphadenopathy of the neck, axilla, and left groin in 2009.  17. History of a microcytic anemia.  18. History of a right olecranon bursa lesion, ? gouty tophus. 19. 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 He previously took AndroGel. 20. History of anemia secondary to chronic disease and Gleevec therapy.  21. history of anorexia-potentially related to [place]. He reports Medicaid would not pay for Megace.  22. Chronic left knee and left foot pain.  23. Status post removal of a left knee screw 07/03/2012. 24. History of hypokalemia. Likely related to diarrhea.He is on a potassium supplement. 25. Status post left foot surgery. 26. Pulsatile fullness right neck. Evaluated by vascular surgery status post CT angiogram 03/07/2014 with no carotid artery aneurysm identified. 27. Cough-abnormal lung exam 05/12/2015. He completed a course of Levaquin. Chest xray negative. 28. Hospitalization 09/20/2016 through 10/07/2016 with unresponsiveness/acute metabolic encephalopathy 29. Upper endoscopy 11/21/2017-normal esophagus; erythematous mucosa in the antrum which was biopsied; normal examined duodenum.  Disposition: [name] appears  unchanged.  He understands the CML is progressing.  Dr. [name] recommends a trial of Bosutinib.  Potential  toxicities were reviewed.  He met with the [facility] pharmacist at today's visit.  He will begin bosutinib on 04/30/2018.  He will return for lab, EKG and follow-up on 05/10/2017.  He will contact the office in the interim with any problems.  He will follow-up with Dr. Benson Norway regarding the perianal pruritus.  Patient seen with Dr. [name].    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  He has been maintained off of specific therapy for the CML secondary to poor tolerance of multiple tyrosine kinase inhibitors.  He is now coming out of hematologic remission.  The peripheral blood PCR reveals a high mutation burden.  He agrees to a trial of bosutinib.  We reviewed potential toxicities associated with the bosutinib including the chance of diarrhea.  He agrees to proceed.  [facility] the [facility] pharmacist to discuss bosutinib toxicity.  The plan is to begin treatment on 04/30/2018.  He will return for an office and lab visit on 05/10/2017.  Julieanne Manson, MD

## 2018-04-25 NOTE — Telephone Encounter (Signed)
Oral Chemotherapy Pharmacist Encounter   I spoke with patient in exam room for overview of new oral chemotherapy medication: Bosulif (bosutinib) for the treatment of chronic myeloid leukemia, planned duration until disease progression or unacceptable toxicity, due to previous intolerable side effects with Gleevec and Sprycel.  Counseled patient on administration, dosing, side effects, monitoring, drug-food interactions, safe handling, storage, and disposal.  Patient will take Bosulif 100mg  tablets, 2 tablets (200mg ) by mouth once daily with food.  Patient knows to avoid grapefruit or grapefruit juice while on therapy with Bosulif.  Bosulif start date: planned for 04/30/18  Side effects include but not limited to: diarrhea, nausea, vomiting, abdominal pain, fatigue, headache, skin rash, edema, decreased blood counts, hepatotoxicity, and cough. Rare, but serious side effects include pancreatitis, decrease in renal function, and QTc prolongation.   Patient has anti-emetic on hand and knows to take it if nausea develops.    Patient informed the majority of patients treated with Bosulif will experience symptoms of diarrhea.  Median time to onset of diarrhea is 2-3 days with symptoms lasting 3 days on average. Patient will obtain anti diarrheal and alert the office of 4 or more loose stools above baseline.  Reviewed with patient importance of keeping a medication schedule and plan for any missed doses.  [name] voiced understanding and appreciation.   All questions answered. Medication reconciliation performed and medication/allergy list updated.  Patient informed about medication interaction with pantoprazole.  Patient states he experiences abdominal pain with PPI discontinuation.  Pantoprazole will be continued for now.  Will discuss discontinuation and switch to H2RA in the future once patient is through the possible GI toxicities associated with Bosulif initiation. Patient  agreeable.  We will follow-up with patient about medication acquisition once insurance authorization is obtained and copayment is known.   Patient knows to call the office with questions or concerns. Oral Oncology Clinic will continue to follow.  Johny Drilling, PharmD, BCPS, BCOP 04/25/2018  4:00 PM Oral Oncology Clinic [phone]

## 2018-04-25 NOTE — Telephone Encounter (Signed)
Oral Oncology Pharmacist Encounter  Dr. [name] to consider the use of Bosulif (bosutinib) for the treatment of chronic myeloid leukemia, planned duration until disease progression or unacceptable toxicity, due to previous intolerable side effects with Gleevec and Sprycel.  Labs from 04/25/18 assessed  Noted SCr=2.43 (improved from 2.95 on 04/13/18, but not at baseline), est CrCl ~ 20 mL/min Manufacturer recommends dose initiation at 200mg  daily with Crcl < 30 mL/min  04/16/18 EKG shows QTc 465 msec  Current medication list in Epic reviewed, DDIs with Bosulif and pantoprazole identified:  Category D interaction: proton pump inhibitors may decrease the serum concentration of Bosulif with increasing gastric pH. Consider alternatives to PPI therapy such as H2RAs  Prescription will be e-scribed to the [facility] for benefits analysis and approval once treatment choice is selected by MD.  Oral Oncology Clinic will continue to follow for insurance authorization, copayment issues, initial counseling and start date.  Johny Drilling, PharmD, BCPS, BCOP  04/25/2018 2:32 PM Oral Oncology Clinic [phone]

## 2018-04-25 NOTE — Telephone Encounter (Signed)
Oral Oncology Patient Advocate Encounter  Received notification from [facility] that prior authorization for Bosulif is required.  PA submitted on CoverMyMeds Key [id number] Status is pending  Oral Oncology Clinic will continue to follow.  [place] Patient [place] Phone [phone] Fax [phone]

## 2018-04-26 MED ORDER — BOSUTINIB 100 MG PO TABS: 200.0000 mg | ORAL_TABLET | Freq: Every day | ORAL | 0 refills | Status: DC

## 2018-04-26 NOTE — Telephone Encounter (Signed)
Oral Oncology Pharmacist Encounter  Insurance authorization for Bosulif has been approved by [facility] Effective dates: 04/25/18-05/09/19  Johny Drilling, PharmD, BCPS, BCOP  04/26/2018 10:44 AM Oral Oncology Clinic [phone]

## 2018-04-26 NOTE — Telephone Encounter (Signed)
Oral Oncology Pharmacist Encounter  Insurance authorization for [name] has been approved. Test claim at the pharmacy revealed copayment $0 for the first 30-day supply.  I called patient to discuss medication acquisition. He will pick up his first bottle of Bosulif from the [place] long outpatient pharmacy tomorrow (04/27/2018) in the afternoon.  He will start his Bosulif on 04/30/2018.  We again discussed likelihood of experiencing diarrhea, management of the diarrhea, as well as duration of the diarrhea that may be associated with Bosulif initiation.  Patient with complains of new right sided pain that originates underneath his breast and radiates around to his back that started last evening. Patient states his pain was relieved with Aleve last night. He states the pain is somewhat similar to indigestion, he does not believe it is associated with his muscles, and he also does not believe that it is the same pain that he generally feels during pancreatitis.  Patient instructed to continue Aleve, as this appears to be alleviating his symptoms, and to maintain adequate hydration. Patient will contact the office if pain persists or worsens.  All questions answered. Patient expressed understanding and appreciation. Patient knows to call the office with any additional questions or concerns.  Johny Drilling, PharmD, BCPS, BCOP  04/26/2018 11:47 AM Oral Oncology Clinic [phone]

## 2018-04-27 MED FILL — BOSULIF 100 MG TABLET: 100 | 30 days supply | Qty: 60 | Fill #0

## 2018-04-27 NOTE — Telephone Encounter (Signed)
Oral Oncology Patient Advocate Encounter  Confirmed with [place] that [facility] was picked up on 04/27/18 with a $0 copay   [place] Patient [place] Phone [phone] Fax [phone]

## 2018-05-15 ENCOUNTER — Telehealth: Payer: Self-pay

## 2018-05-15 ENCOUNTER — Encounter: Payer: Self-pay | Admitting: Nurse Practitioner

## 2018-05-15 ENCOUNTER — Inpatient Hospital Stay (HOSPITAL_BASED_OUTPATIENT_CLINIC_OR_DEPARTMENT_OTHER): Payer: Medicare Other | Admitting: Nurse Practitioner

## 2018-05-15 ENCOUNTER — Inpatient Hospital Stay: Payer: Medicare Other | Attending: Nurse Practitioner

## 2018-05-15 VITALS — BP 189/86 | HR 73 | Temp 98.4°F | Resp 17 | Ht 69.0 in | Wt 135.7 lb

## 2018-05-15 DIAGNOSIS — R0609 Other forms of dyspnea: Secondary | ICD-10-CM | POA: Diagnosis not present

## 2018-05-15 DIAGNOSIS — R197 Diarrhea, unspecified: Secondary | ICD-10-CM

## 2018-05-15 DIAGNOSIS — I2510 Atherosclerotic heart disease of native coronary artery without angina pectoris: Secondary | ICD-10-CM

## 2018-05-15 DIAGNOSIS — C9212 Chronic myeloid leukemia, BCR/ABL-positive, in relapse: Secondary | ICD-10-CM | POA: Diagnosis present

## 2018-05-15 DIAGNOSIS — R630 Anorexia: Secondary | ICD-10-CM | POA: Insufficient documentation

## 2018-05-15 DIAGNOSIS — Z79899 Other long term (current) drug therapy: Secondary | ICD-10-CM | POA: Insufficient documentation

## 2018-05-15 DIAGNOSIS — Z87891 Personal history of nicotine dependence: Secondary | ICD-10-CM | POA: Diagnosis not present

## 2018-05-15 DIAGNOSIS — C9210 Chronic myeloid leukemia, BCR/ABL-positive, not having achieved remission: Secondary | ICD-10-CM

## 2018-05-15 DIAGNOSIS — R112 Nausea with vomiting, unspecified: Secondary | ICD-10-CM | POA: Diagnosis not present

## 2018-05-15 DIAGNOSIS — R05 Cough: Secondary | ICD-10-CM | POA: Diagnosis not present

## 2018-05-15 LAB — CMP (CANCER CENTER ONLY)
ALT: 22 U/L (ref 0–44)
ANION GAP: 9 (ref 5–15)
AST: 25 U/L (ref 15–41)
Albumin: 2.7 g/dL — ABNORMAL LOW (ref 3.5–5.0)
Alkaline Phosphatase: 174 U/L — ABNORMAL HIGH (ref 38–126)
BUN: 24 mg/dL — ABNORMAL HIGH (ref 8–23)
CO2: 20 mmol/L — ABNORMAL LOW (ref 22–32)
Calcium: 8.4 mg/dL — ABNORMAL LOW (ref 8.9–10.3)
Chloride: 113 mmol/L — ABNORMAL HIGH (ref 98–111)
Creatinine: 3.02 mg/dL (ref 0.61–1.24)
GFR, EST AFRICAN AMERICAN: 23 mL/min — AB (ref >60)
GFR, EST NON AFRICAN AMERICAN: 20 mL/min — AB (ref >60)
Glucose, Bld: 86 mg/dL (ref 70–99)
Potassium: 4.3 mmol/L (ref 3.5–5.1)
Sodium: 142 mmol/L (ref 135–145)
Total Bilirubin: 0.3 mg/dL (ref 0.3–1.2)
Total Protein: 5.7 g/dL — ABNORMAL LOW (ref 6.5–8.1)

## 2018-05-15 LAB — CBC WITH DIFFERENTIAL (CANCER CENTER ONLY)
Abs Immature Granulocytes: 0.02 10*3/uL (ref 0.00–0.07)
Basophils Absolute: 0.1 10*3/uL (ref 0.0–0.1)
Basophils Relative: 1 %
Eosinophils Absolute: 0.5 10*3/uL (ref 0.0–0.5)
Eosinophils Relative: 8 %
HCT: 29.8 % — ABNORMAL LOW (ref 39.0–52.0)
Hemoglobin: 9 g/dL — ABNORMAL LOW (ref 13.0–17.0)
Immature Granulocytes: 0 %
Lymphocytes Relative: 11 %
Lymphs Abs: 0.7 10*3/uL (ref 0.7–4.0)
MCH: 25 pg — AB (ref 26.0–34.0)
MCHC: 30.2 g/dL (ref 30.0–36.0)
MCV: 82.8 fL (ref 80.0–100.0)
Monocytes Absolute: 0.6 10*3/uL (ref 0.1–1.0)
Monocytes Relative: 9 %
Neutro Abs: 4.7 10*3/uL (ref 1.7–7.7)
Neutrophils Relative %: 71 %
Platelet Count: 169 10*3/uL (ref 150–400)
RBC: 3.6 MIL/uL — ABNORMAL LOW (ref 4.22–5.81)
RDW: 18.7 % — ABNORMAL HIGH (ref 11.5–15.5)
WBC: 6.7 10*3/uL (ref 4.0–10.5)
nRBC: 0 % (ref 0.0–0.2)

## 2018-05-15 NOTE — Telephone Encounter (Signed)
Printed avs and calender of upcoming appointment. Per 1/7 los 

## 2018-05-15 NOTE — Progress Notes (Addendum)
[facility] OFFICE PROGRESS NOTE   Diagnosis: CML  INTERVAL HISTORY:   [name] returns as scheduled.  He began bosutinib 04/30/2018.  He feels he is tolerating well.  He tends to have 1 or 2 loose stools after each dose and then takes Imodium with good control.  No significant nausea or vomiting.  No mouth sores.  He describes his appetite as "so-so".  No rash.  Stable dyspnea on exertion and cough which he relates to smoking.  No fevers or sweats.  He reports he did not take his blood pressure medication yet today because he has not eaten.  Objective:  Vital signs in last 24 hours:  Blood pressure (!) 189/86, pulse 73, temperature 98.4 F (36.9 C), temperature source Oral, resp. rate 17, height 5\' 9"  (1.753 m), weight 135 lb 11.2 oz (61.6 kg), SpO2 98 %.    HEENT: No thrush or ulcers. Resp: Lungs clear bilaterally. Cardio: Regular rate and rhythm. GI: Abdomen soft and nontender.  No hepatosplenomegaly. Vascular: No left leg edema..   Lab Results:  Lab Results  Component Value Date   WBC 6.7 05/15/2018   HGB 9.0 (L) 05/15/2018   HCT 29.8 (L) 05/15/2018   MCV 82.8 05/15/2018   PLT 169 05/15/2018   NEUTROABS 4.7 05/15/2018    Imaging:  No results found.  Medications: I have reviewed the patient's current medications.  Assessment/Plan: 1. Chronic myelogenous leukemia, diagnosed in January of 2009. He remains in hematologic remission. He is taking Gleevec at a dose of 200 mg daily. The peripheral blood PCR was markedly increased in May 2013, likely reflecting medical noncompliance. The peripheral blood PCRwas stable on 05/11/2017.  Trial of dasatinib April/May 2019; discontinued due to poor tolerance with diarrhea and nausea  Gleevec resumed May 2019  [place] placed on hold 11/02/2017  Trial of dasatinib 50 mg daily approximately 11/29/2017  Dasatinib placed on hold 12/14/2017  Gleevec 100 mg daily beginning 03/21/2018  Bosutinib 200 mg daily  beginning 04/30/2018 2. 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 3. Status post C3-C4, C4-C5, anterior cervical diskectomy and fusion with allograft and plating 09/30/2010. 4. 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 5. Depression 6. Diarrhea, ? related to chronic pancreatitis versus Gleevec. He takes Lomotil as needed.  7. Indurated facial skin lesion 11/20/2007 with a history of MRSA skin infection of the submental area in May 2008. The induration resolved with doxycycline. 8. Left knee arthroscopy 05/25/2007. 9. Postoperative left knee effusion/pain, likely related to gout. 10. History of gout.  11. Chronic pancreatitis. 12. Status post right above-the-knee amputation. 13. MRSA infection of the submental area May 2008. 14. History of tobacco, alcohol, and cocaine use. 15. History of coronary artery disease. 16. "Shotty" lymphadenopathy of the neck, axilla, and left groin in 2009.  17. History of a microcytic anemia.  18. History of a right olecranon bursa lesion, ? gouty tophus. 19. 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 He previously took AndroGel. 20. History of anemia secondary to chronic disease and Gleevec therapy.  21. history of anorexia-potentially related to [place]. He reports Medicaid would not pay for Megace.  22. Chronic left knee and left foot pain.  23. Status post removal of a left knee screw 07/03/2012. 24. History of hypokalemia. Likely related to diarrhea.He is on a potassium supplement. 25. Status post left foot surgery. 26. Pulsatile fullness right neck. Evaluated by vascular surgery status post CT angiogram 03/07/2014 with no  carotid artery aneurysm identified. 27. Cough-abnormal lung exam 05/12/2015. He completed a course of Levaquin. Chest xray negative. 28. Hospitalization 09/20/2016 through 10/07/2016 with unresponsiveness/acute metabolic encephalopathy 29. Upper endoscopy  11/21/2017-normal esophagus; erythematous mucosa in the antrum which was biopsied; normal examined duodenum.   Disposition: [name] appears stable.  He will continue bosutinib.  Overall he seems to be tolerating well.  He did not want to stay for the EKG today.  He will return for lab, EKG and follow-up in 2 weeks.  He will contact the office in the interim with any problems.  We reviewed the labs from today.  Creatinine is slightly higher.  He continues follow-up with nephrology.  Plan reviewed with Dr. [name].    [name] ANP/GNP-BC   05/15/2018  11:27 AM

## 2018-05-15 NOTE — Telephone Encounter (Signed)
TC from lab to report a critical lab for pt. Lattie Haw made aware of patients critical creatine level.

## 2018-05-15 NOTE — Telephone Encounter (Signed)
Per Lattie Haw  Faxed a copy of today's (05/15/18) labs to nephrology ( [place]). Fax sent to [phone] . Confirmation fax received.

## 2018-05-18 ENCOUNTER — Other Ambulatory Visit: Payer: Self-pay | Admitting: *Deleted

## 2018-05-18 DIAGNOSIS — C9210 Chronic myeloid leukemia, BCR/ABL-positive, not having achieved remission: Secondary | ICD-10-CM

## 2018-05-18 MED ORDER — DIPHENOXYLATE-ATROPINE 2.5-0.025 MG PO TABS: ORAL_TABLET | ORAL | 0 refills | Status: DC

## 2018-05-23 ENCOUNTER — Other Ambulatory Visit: Payer: Self-pay | Admitting: Oncology

## 2018-05-23 DIAGNOSIS — C9210 Chronic myeloid leukemia, BCR/ABL-positive, not having achieved remission: Secondary | ICD-10-CM

## 2018-05-23 NOTE — Telephone Encounter (Signed)
Done

## 2018-05-25 MED FILL — BOSULIF 100 MG TABLET: 100 | 30 days supply | Qty: 60 | Fill #0

## 2018-05-28 ENCOUNTER — Telehealth: Payer: Self-pay | Admitting: Nurse Practitioner

## 2018-05-28 ENCOUNTER — Ambulatory Visit: Payer: Self-pay | Admitting: Nurse Practitioner

## 2018-05-28 ENCOUNTER — Inpatient Hospital Stay: Payer: Medicare Other

## 2018-05-28 ENCOUNTER — Inpatient Hospital Stay (HOSPITAL_BASED_OUTPATIENT_CLINIC_OR_DEPARTMENT_OTHER): Payer: Medicare Other | Admitting: Nurse Practitioner

## 2018-05-28 ENCOUNTER — Encounter: Payer: Self-pay | Admitting: Nurse Practitioner

## 2018-05-28 VITALS — BP 156/80 | HR 58 | Temp 98.2°F | Resp 17 | Ht 69.0 in | Wt 137.3 lb

## 2018-05-28 DIAGNOSIS — R630 Anorexia: Secondary | ICD-10-CM | POA: Diagnosis not present

## 2018-05-28 DIAGNOSIS — C9212 Chronic myeloid leukemia, BCR/ABL-positive, in relapse: Secondary | ICD-10-CM | POA: Diagnosis not present

## 2018-05-28 DIAGNOSIS — C9210 Chronic myeloid leukemia, BCR/ABL-positive, not having achieved remission: Secondary | ICD-10-CM

## 2018-05-28 DIAGNOSIS — I2510 Atherosclerotic heart disease of native coronary artery without angina pectoris: Secondary | ICD-10-CM

## 2018-05-28 DIAGNOSIS — R112 Nausea with vomiting, unspecified: Secondary | ICD-10-CM

## 2018-05-28 DIAGNOSIS — Z87891 Personal history of nicotine dependence: Secondary | ICD-10-CM

## 2018-05-28 DIAGNOSIS — Z79899 Other long term (current) drug therapy: Secondary | ICD-10-CM

## 2018-05-28 LAB — CBC WITH DIFFERENTIAL (CANCER CENTER ONLY)
Abs Immature Granulocytes: 0.02 10*3/uL (ref 0.00–0.07)
Basophils Absolute: 0 10*3/uL (ref 0.0–0.1)
Basophils Relative: 0 %
Eosinophils Absolute: 0.4 10*3/uL (ref 0.0–0.5)
Eosinophils Relative: 7 %
HEMATOCRIT: 29.2 % — AB (ref 39.0–52.0)
Hemoglobin: 9.1 g/dL — ABNORMAL LOW (ref 13.0–17.0)
Immature Granulocytes: 0 %
LYMPHS ABS: 0.9 10*3/uL (ref 0.7–4.0)
Lymphocytes Relative: 16 %
MCH: 25.2 pg — ABNORMAL LOW (ref 26.0–34.0)
MCHC: 31.2 g/dL (ref 30.0–36.0)
MCV: 80.9 fL (ref 80.0–100.0)
MONO ABS: 0.6 10*3/uL (ref 0.1–1.0)
MONOS PCT: 11 %
Neutro Abs: 3.8 10*3/uL (ref 1.7–7.7)
Neutrophils Relative %: 66 %
Platelet Count: 214 10*3/uL (ref 150–400)
RBC: 3.61 MIL/uL — ABNORMAL LOW (ref 4.22–5.81)
RDW: 17.2 % — ABNORMAL HIGH (ref 11.5–15.5)
WBC Count: 5.7 10*3/uL (ref 4.0–10.5)
nRBC: 0 % (ref 0.0–0.2)

## 2018-05-28 LAB — CMP (CANCER CENTER ONLY)
ALT: 21 U/L (ref 0–44)
AST: 23 U/L (ref 15–41)
Albumin: 2.6 g/dL — ABNORMAL LOW (ref 3.5–5.0)
Alkaline Phosphatase: 188 U/L — ABNORMAL HIGH (ref 38–126)
Anion gap: 6 (ref 5–15)
BUN: 20 mg/dL (ref 8–23)
CO2: 24 mmol/L (ref 22–32)
Calcium: 8.3 mg/dL — ABNORMAL LOW (ref 8.9–10.3)
Chloride: 110 mmol/L (ref 98–111)
Creatinine: 3.11 mg/dL (ref 0.61–1.24)
GFR, Est AFR Am: 22 mL/min — ABNORMAL LOW (ref >60)
GFR, Estimated: 19 mL/min — ABNORMAL LOW (ref >60)
Glucose, Bld: 96 mg/dL (ref 70–99)
Potassium: 4.5 mmol/L (ref 3.5–5.1)
Sodium: 140 mmol/L (ref 135–145)
Total Bilirubin: 0.4 mg/dL (ref 0.3–1.2)
Total Protein: 5.6 g/dL — ABNORMAL LOW (ref 6.5–8.1)

## 2018-05-28 NOTE — Telephone Encounter (Signed)
Printed calendar and avs. °

## 2018-05-28 NOTE — Progress Notes (Signed)
[name] OFFICE PROGRESS NOTE   Diagnosis: CML  INTERVAL HISTORY:   [name] returns as scheduled.  He continues bosutinib.  He denies significant diarrhea.  He had a single episode of nausea/vomiting last week.  No rash.  Appetite and energy level are poor.  Objective:  Vital signs in last 24 hours:  Blood pressure (!) 156/80, pulse (!) 58, temperature 98.2 F (36.8 C), temperature source Oral, resp. rate 17, height 5\' 9"  (1.753 m), weight 137 lb 4.8 oz (62.3 kg), SpO2 100 %.    HEENT: No thrush or ulcers. Resp: Lungs clear bilaterally. Cardio: Regular rate and rhythm. GI: Abdomen soft and nontender.  No hepatosplenomegaly. Vascular: No left leg edema. Skin: No rash.   Lab Results:  Lab Results  Component Value Date   WBC 5.7 05/28/2018   HGB 9.1 (L) 05/28/2018   HCT 29.2 (L) 05/28/2018   MCV 80.9 05/28/2018   PLT 214 05/28/2018   NEUTROABS 3.8 05/28/2018    Imaging:  No results found.  Medications: I have reviewed the patient's current medications.  Assessment/Plan: 1. Chronic myelogenous leukemia, diagnosed in January of 2009. He remains in hematologic remission. He is taking Gleevec at a dose of 200 mg daily. The peripheral blood PCR was markedly increased in May 2013, likely reflecting medical noncompliance. The peripheral blood PCRwas stable on 05/11/2017.  Trial of dasatinib April/May 2019; discontinued due to poor tolerance with diarrhea and nausea  Gleevec resumed May 2019  [place] placed on hold 11/02/2017  Trial of dasatinib 50 mg daily approximately 11/29/2017  Dasatinib placed on hold 12/14/2017  Gleevec 100 mg daily beginning 03/21/2018  Bosutinib 200 mg daily beginning12/23/2019 2. 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 3. Status post C3-C4, C4-C5, anterior cervical diskectomy and fusion with allograft and plating 09/30/2010. 4. Status post a fall with a C3-C4 and C4-C5 traumatic cervical disk herniation with central  spinal cord injury. 5. Depression 6. Diarrhea, ? related to chronic pancreatitis versus Gleevec. He takes Lomotil as needed.  7. Indurated facial skin lesion 11/20/2007 with a history of MRSA skin infection of the submental area in May 2008. The induration resolved with doxycycline. 8. Left knee arthroscopy 05/25/2007. 9. Postoperative left knee effusion/pain, likely related to gout. 10. History of gout.  11. Chronic pancreatitis. 12. Status post right above-the-knee amputation. 13. MRSA infection of the submental area May 2008. 14. History of tobacco, alcohol, and cocaine use. 15. History of coronary artery disease. 16. "Shotty" lymphadenopathy of the neck, axilla, and left groin in 2009.  17. History of a microcytic anemia.  18. History of a right olecranon bursa lesion, ? gouty tophus. 19. 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 He previously took AndroGel. 20. History of anemia secondary to chronic disease and Gleevec therapy.  21. history of anorexia-potentially related to [place]. He reports Medicaid would not pay for Megace.  22. Chronic left knee and left foot pain.  23. Status post removal of a left knee screw 07/03/2012. 24. History of hypokalemia. Likely related to diarrhea.He is on a potassium supplement. 25. Status post left foot surgery. 26. Pulsatile fullness right neck. Evaluated by vascular surgery status post CT angiogram 03/07/2014 with no carotid artery aneurysm identified. 27. Cough-abnormal lung exam 05/12/2015. He completed a course of Levaquin. Chest xray negative. 28. Hospitalization 09/20/2016 through 10/07/2016 with unresponsiveness/acute metabolic encephalopathy 29. Upper endoscopy 11/21/2017-normal esophagus; erythematous mucosa in the antrum which was biopsied; normal examined duodenum.   Disposition: [name] appears unchanged.  He continues to tolerate  bosutinib well.  We reviewed the labs from today.  Hemoglobin is stable.  White count continues to be  improved.  Kidney function is stable to mildly increased.  He will continue bosutinib at the current dose.  He will return for lab and follow-up in 2 weeks.  We will follow-up on the BCR/ABL from today.  Plan reviewed with Dr. [name].  [name] ANP/GNP-BC   05/28/2018  2:49 PM

## 2018-05-28 NOTE — Progress Notes (Signed)
Critical lab brought over by lab. Creatine high. Lisa aware.

## 2018-06-11 ENCOUNTER — Other Ambulatory Visit: Payer: Self-pay | Admitting: *Deleted

## 2018-06-11 ENCOUNTER — Inpatient Hospital Stay: Payer: Medicare Other | Attending: Nurse Practitioner

## 2018-06-11 ENCOUNTER — Inpatient Hospital Stay: Payer: Medicare Other | Admitting: Nurse Practitioner

## 2018-06-11 DIAGNOSIS — C9210 Chronic myeloid leukemia, BCR/ABL-positive, not having achieved remission: Secondary | ICD-10-CM

## 2018-06-11 MED ORDER — DIPHENOXYLATE-ATROPINE 2.5-0.025 MG PO TABS: ORAL_TABLET | ORAL | 1 refills | Status: DC

## 2018-06-12 ENCOUNTER — Telehealth: Payer: Self-pay | Admitting: Oncology

## 2018-06-12 NOTE — Telephone Encounter (Signed)
Called patient per 2/3 sch message - left message to call back to r/.s

## 2018-06-15 ENCOUNTER — Other Ambulatory Visit: Payer: Self-pay | Admitting: Oncology

## 2018-06-15 DIAGNOSIS — C9210 Chronic myeloid leukemia, BCR/ABL-positive, not having achieved remission: Secondary | ICD-10-CM

## 2018-06-18 ENCOUNTER — Telehealth: Payer: Self-pay | Admitting: *Deleted

## 2018-06-18 NOTE — Telephone Encounter (Signed)
Patient calling to follow up on his next appointment. Sent message to scheduler, [name] who had attempted to reach [name] on 06/11/18 with an appointment.

## 2018-06-19 ENCOUNTER — Telehealth: Payer: Self-pay | Admitting: *Deleted

## 2018-06-19 NOTE — Telephone Encounter (Signed)
Called patient to follow up on his missed lab appointment of last week. He agrees to come in on 2/14 at 3:30 for his labs. Reminded him we need to monitor his counts and kidney functions while on the Bosulif. He understands and agrees.

## 2018-06-20 ENCOUNTER — Emergency Department (HOSPITAL_COMMUNITY): Payer: Medicare Other

## 2018-06-20 ENCOUNTER — Inpatient Hospital Stay (HOSPITAL_COMMUNITY)
Admission: EM | Admit: 2018-06-20 | Discharge: 2018-07-05 | DRG: 070 | Disposition: A | Discharge status: Home Health | Payer: Medicare Other | Attending: Family Medicine | Admitting: Family Medicine

## 2018-06-20 ENCOUNTER — Observation Stay (HOSPITAL_COMMUNITY): Payer: Medicare Other

## 2018-06-20 ENCOUNTER — Encounter (HOSPITAL_COMMUNITY): Payer: Self-pay | Admitting: *Deleted

## 2018-06-20 DIAGNOSIS — I493 Ventricular premature depolarization: Secondary | ICD-10-CM | POA: Diagnosis not present

## 2018-06-20 DIAGNOSIS — Z8249 Family history of ischemic heart disease and other diseases of the circulatory system: Secondary | ICD-10-CM

## 2018-06-20 DIAGNOSIS — F1010 Alcohol abuse, uncomplicated: Secondary | ICD-10-CM | POA: Diagnosis present

## 2018-06-20 DIAGNOSIS — J90 Pleural effusion, not elsewhere classified: Secondary | ICD-10-CM | POA: Diagnosis not present

## 2018-06-20 DIAGNOSIS — R7989 Other specified abnormal findings of blood chemistry: Secondary | ICD-10-CM

## 2018-06-20 DIAGNOSIS — R062 Wheezing: Secondary | ICD-10-CM

## 2018-06-20 DIAGNOSIS — D77 Other disorders of blood and blood-forming organs in diseases classified elsewhere: Secondary | ICD-10-CM | POA: Diagnosis present

## 2018-06-20 DIAGNOSIS — I639 Cerebral infarction, unspecified: Secondary | ICD-10-CM | POA: Diagnosis not present

## 2018-06-20 DIAGNOSIS — Z9861 Coronary angioplasty status: Secondary | ICD-10-CM

## 2018-06-20 DIAGNOSIS — T85598A Other mechanical complication of other gastrointestinal prosthetic devices, implants and grafts, initial encounter: Secondary | ICD-10-CM

## 2018-06-20 DIAGNOSIS — N183 Chronic kidney disease, stage 3 unspecified: Secondary | ICD-10-CM

## 2018-06-20 DIAGNOSIS — R109 Unspecified abdominal pain: Secondary | ICD-10-CM

## 2018-06-20 DIAGNOSIS — D509 Iron deficiency anemia, unspecified: Secondary | ICD-10-CM

## 2018-06-20 DIAGNOSIS — R0600 Dyspnea, unspecified: Secondary | ICD-10-CM

## 2018-06-20 DIAGNOSIS — R079 Chest pain, unspecified: Secondary | ICD-10-CM

## 2018-06-20 DIAGNOSIS — F17210 Nicotine dependence, cigarettes, uncomplicated: Secondary | ICD-10-CM | POA: Diagnosis present

## 2018-06-20 DIAGNOSIS — Z89611 Acquired absence of right leg above knee: Secondary | ICD-10-CM

## 2018-06-20 DIAGNOSIS — I2510 Atherosclerotic heart disease of native coronary artery without angina pectoris: Secondary | ICD-10-CM | POA: Diagnosis present

## 2018-06-20 DIAGNOSIS — Z9114 Patient's other noncompliance with medication regimen: Secondary | ICD-10-CM

## 2018-06-20 DIAGNOSIS — J9601 Acute respiratory failure with hypoxia: Secondary | ICD-10-CM

## 2018-06-20 DIAGNOSIS — D631 Anemia in chronic kidney disease: Secondary | ICD-10-CM | POA: Diagnosis present

## 2018-06-20 DIAGNOSIS — I161 Hypertensive emergency: Secondary | ICD-10-CM | POA: Diagnosis present

## 2018-06-20 DIAGNOSIS — K861 Other chronic pancreatitis: Secondary | ICD-10-CM | POA: Diagnosis present

## 2018-06-20 DIAGNOSIS — Z781 Physical restraint status: Secondary | ICD-10-CM

## 2018-06-20 DIAGNOSIS — R778 Other specified abnormalities of plasma proteins: Secondary | ICD-10-CM

## 2018-06-20 DIAGNOSIS — I1310 Hypertensive heart and chronic kidney disease without heart failure, with stage 1 through stage 4 chronic kidney disease, or unspecified chronic kidney disease: Secondary | ICD-10-CM | POA: Diagnosis present

## 2018-06-20 DIAGNOSIS — I252 Old myocardial infarction: Secondary | ICD-10-CM

## 2018-06-20 DIAGNOSIS — Z0189 Encounter for other specified special examinations: Secondary | ICD-10-CM

## 2018-06-20 DIAGNOSIS — F05 Delirium due to known physiological condition: Secondary | ICD-10-CM | POA: Diagnosis present

## 2018-06-20 DIAGNOSIS — R29705 NIHSS score 5: Secondary | ICD-10-CM | POA: Diagnosis present

## 2018-06-20 DIAGNOSIS — J9690 Respiratory failure, unspecified, unspecified whether with hypoxia or hypercapnia: Secondary | ICD-10-CM

## 2018-06-20 DIAGNOSIS — Z91041 Radiographic dye allergy status: Secondary | ICD-10-CM

## 2018-06-20 DIAGNOSIS — M1A09X0 Idiopathic chronic gout, multiple sites, without tophus (tophi): Secondary | ICD-10-CM | POA: Diagnosis present

## 2018-06-20 DIAGNOSIS — K228 Other specified diseases of esophagus: Secondary | ICD-10-CM | POA: Diagnosis present

## 2018-06-20 DIAGNOSIS — J432 Centrilobular emphysema: Secondary | ICD-10-CM | POA: Diagnosis present

## 2018-06-20 DIAGNOSIS — R195 Other fecal abnormalities: Secondary | ICD-10-CM

## 2018-06-20 DIAGNOSIS — G40901 Epilepsy, unspecified, not intractable, with status epilepticus: Secondary | ICD-10-CM | POA: Diagnosis present

## 2018-06-20 DIAGNOSIS — N179 Acute kidney failure, unspecified: Secondary | ICD-10-CM | POA: Diagnosis present

## 2018-06-20 DIAGNOSIS — Z79891 Long term (current) use of opiate analgesic: Secondary | ICD-10-CM

## 2018-06-20 DIAGNOSIS — R4182 Altered mental status, unspecified: Secondary | ICD-10-CM | POA: Diagnosis not present

## 2018-06-20 DIAGNOSIS — K2940 Chronic atrophic gastritis without bleeding: Secondary | ICD-10-CM | POA: Diagnosis present

## 2018-06-20 DIAGNOSIS — F419 Anxiety disorder, unspecified: Secondary | ICD-10-CM | POA: Diagnosis present

## 2018-06-20 DIAGNOSIS — E440 Moderate protein-calorie malnutrition: Secondary | ICD-10-CM

## 2018-06-20 DIAGNOSIS — I6783 Posterior reversible encephalopathy syndrome: Secondary | ICD-10-CM | POA: Diagnosis not present

## 2018-06-20 DIAGNOSIS — Z4659 Encounter for fitting and adjustment of other gastrointestinal appliance and device: Secondary | ICD-10-CM

## 2018-06-20 DIAGNOSIS — R0789 Other chest pain: Secondary | ICD-10-CM

## 2018-06-20 DIAGNOSIS — I10 Essential (primary) hypertension: Secondary | ICD-10-CM | POA: Diagnosis present

## 2018-06-20 DIAGNOSIS — Z7951 Long term (current) use of inhaled steroids: Secondary | ICD-10-CM

## 2018-06-20 DIAGNOSIS — E870 Hyperosmolality and hypernatremia: Secondary | ICD-10-CM | POA: Diagnosis not present

## 2018-06-20 DIAGNOSIS — F329 Major depressive disorder, single episode, unspecified: Secondary | ICD-10-CM | POA: Diagnosis present

## 2018-06-20 DIAGNOSIS — C9210 Chronic myeloid leukemia, BCR/ABL-positive, not having achieved remission: Secondary | ICD-10-CM | POA: Diagnosis present

## 2018-06-20 DIAGNOSIS — R569 Unspecified convulsions: Secondary | ICD-10-CM

## 2018-06-20 DIAGNOSIS — Z79899 Other long term (current) drug therapy: Secondary | ICD-10-CM

## 2018-06-20 DIAGNOSIS — G8194 Hemiplegia, unspecified affecting left nondominant side: Secondary | ICD-10-CM | POA: Diagnosis present

## 2018-06-20 DIAGNOSIS — E785 Hyperlipidemia, unspecified: Secondary | ICD-10-CM | POA: Diagnosis present

## 2018-06-20 DIAGNOSIS — R29810 Facial weakness: Secondary | ICD-10-CM | POA: Diagnosis present

## 2018-06-20 DIAGNOSIS — N184 Chronic kidney disease, stage 4 (severe): Secondary | ICD-10-CM

## 2018-06-20 DIAGNOSIS — I472 Ventricular tachycardia: Secondary | ICD-10-CM | POA: Diagnosis not present

## 2018-06-20 DIAGNOSIS — K449 Diaphragmatic hernia without obstruction or gangrene: Secondary | ICD-10-CM | POA: Diagnosis present

## 2018-06-20 DIAGNOSIS — R64 Cachexia: Secondary | ICD-10-CM | POA: Diagnosis present

## 2018-06-20 DIAGNOSIS — Z681 Body mass index (BMI) 19 or less, adult: Secondary | ICD-10-CM

## 2018-06-20 DIAGNOSIS — R1312 Dysphagia, oropharyngeal phase: Secondary | ICD-10-CM | POA: Diagnosis present

## 2018-06-20 DIAGNOSIS — G9341 Metabolic encephalopathy: Secondary | ICD-10-CM

## 2018-06-20 LAB — DIFFERENTIAL
Abs Immature Granulocytes: 0.04 10*3/uL (ref 0.00–0.07)
Basophils Absolute: 0 10*3/uL (ref 0.0–0.1)
Basophils Relative: 1 %
Eosinophils Absolute: 0.4 10*3/uL (ref 0.0–0.5)
Eosinophils Relative: 6 %
Immature Granulocytes: 1 %
Lymphocytes Relative: 16 %
Lymphs Abs: 1.1 10*3/uL (ref 0.7–4.0)
Monocytes Absolute: 0.3 10*3/uL (ref 0.1–1.0)
Monocytes Relative: 4 %
NEUTROS PCT: 72 %
Neutro Abs: 5.3 10*3/uL (ref 1.7–7.7)

## 2018-06-20 LAB — COMPREHENSIVE METABOLIC PANEL
ALT: 18 U/L (ref 0–44)
AST: 27 U/L (ref 15–41)
Albumin: 2.4 g/dL — ABNORMAL LOW (ref 3.5–5.0)
Alkaline Phosphatase: 133 U/L — ABNORMAL HIGH (ref 38–126)
Anion gap: 12 (ref 5–15)
BUN: 27 mg/dL — ABNORMAL HIGH (ref 8–23)
CHLORIDE: 109 mmol/L (ref 98–111)
CO2: 21 mmol/L — ABNORMAL LOW (ref 22–32)
CREATININE: 3.28 mg/dL — AB (ref 0.61–1.24)
Calcium: 8.2 mg/dL — ABNORMAL LOW (ref 8.9–10.3)
GFR calc Af Amer: 21 mL/min — ABNORMAL LOW (ref >60)
GFR calc non Af Amer: 18 mL/min — ABNORMAL LOW (ref >60)
Glucose, Bld: 104 mg/dL — ABNORMAL HIGH (ref 70–99)
Potassium: 4.9 mmol/L (ref 3.5–5.1)
Sodium: 142 mmol/L (ref 135–145)
Total Bilirubin: 0.6 mg/dL (ref 0.3–1.2)
Total Protein: 5.4 g/dL — ABNORMAL LOW (ref 6.5–8.1)

## 2018-06-20 LAB — URINALYSIS, ROUTINE W REFLEX MICROSCOPIC
Bacteria, UA: NONE SEEN
Bilirubin Urine: NEGATIVE
Glucose, UA: NEGATIVE mg/dL
Hgb urine dipstick: NEGATIVE
Ketones, ur: NEGATIVE mg/dL
Leukocytes,Ua: NEGATIVE
Nitrite: NEGATIVE
PROTEIN: 100 mg/dL — AB
Specific Gravity, Urine: 1.016 (ref 1.005–1.030)
pH: 6 (ref 5.0–8.0)

## 2018-06-20 LAB — CBC
HCT: 33.6 % — ABNORMAL LOW (ref 39.0–52.0)
Hemoglobin: 9.8 g/dL — ABNORMAL LOW (ref 13.0–17.0)
MCH: 23.3 pg — AB (ref 26.0–34.0)
MCHC: 29.2 g/dL — ABNORMAL LOW (ref 30.0–36.0)
MCV: 80 fL (ref 80.0–100.0)
Platelets: 221 10*3/uL (ref 150–400)
RBC: 4.2 MIL/uL — ABNORMAL LOW (ref 4.22–5.81)
RDW: 17.6 % — ABNORMAL HIGH (ref 11.5–15.5)
WBC: 7.3 10*3/uL (ref 4.0–10.5)
nRBC: 0 % (ref 0.0–0.2)

## 2018-06-20 LAB — PROTIME-INR
INR: 1.09
Prothrombin Time: 14 seconds (ref 11.4–15.2)

## 2018-06-20 LAB — BCR/ABL

## 2018-06-20 LAB — APTT: APTT: 38 s — AB (ref 24–36)

## 2018-06-20 MED ORDER — THIAMINE HCL 100 MG/ML IJ SOLN
100.0000 mg | Freq: Every day | INTRAMUSCULAR | Status: DC
  Administered 2018-06-20: 100 mg via INTRAVENOUS
  Filled 2018-06-20: qty 2

## 2018-06-20 MED ORDER — SODIUM CHLORIDE 0.9% FLUSH
3.0000 mL | Freq: Once | INTRAVENOUS | Status: AC
  Administered 2018-06-20: 3 mL via INTRAVENOUS

## 2018-06-20 MED ORDER — ONDANSETRON HCL 4 MG/2ML IJ SOLN
INTRAMUSCULAR | Status: AC
  Filled 2018-06-20: qty 2

## 2018-06-20 MED ORDER — HALOPERIDOL LACTATE 5 MG/ML IJ SOLN
5.0000 mg | Freq: Once | INTRAMUSCULAR | Status: AC
  Administered 2018-06-20: 5 mg via INTRAVENOUS
  Filled 2018-06-20: qty 1

## 2018-06-20 MED ORDER — MORPHINE SULFATE (PF) 2 MG/ML IV SOLN
0.5000 mg | INTRAVENOUS | Status: DC | PRN
  Administered 2018-06-20 (×2): 1 mg via INTRAVENOUS
  Filled 2018-06-20 (×2): qty 1

## 2018-06-20 MED ORDER — LORAZEPAM 2 MG/ML IJ SOLN
INTRAMUSCULAR | Status: AC
  Administered 2018-06-20: 2 mg
  Filled 2018-06-20: qty 1

## 2018-06-20 MED ORDER — SODIUM CHLORIDE 0.9 % IV BOLUS
500.0000 mL | Freq: Once | INTRAVENOUS | Status: AC
  Administered 2018-06-20: 500 mL via INTRAVENOUS

## 2018-06-20 MED ORDER — ACETAMINOPHEN 325 MG PO TABS: 650.0000 mg | ORAL_TABLET | ORAL | Status: DC | PRN

## 2018-06-20 MED ORDER — ACETAMINOPHEN 650 MG RE SUPP
650.0000 mg | RECTAL | Status: DC | PRN
  Administered 2018-06-21 (×3): 650 mg via RECTAL
  Filled 2018-06-20 (×4): qty 1

## 2018-06-20 MED ORDER — HYDRALAZINE HCL 20 MG/ML IJ SOLN
10.0000 mg | INTRAMUSCULAR | Status: DC | PRN
  Administered 2018-06-20: 10 mg via INTRAVENOUS
  Filled 2018-06-20: qty 1

## 2018-06-20 MED ORDER — LABETALOL HCL 5 MG/ML IV SOLN
10.0000 mg | INTRAVENOUS | Status: AC | PRN
  Administered 2018-06-20 – 2018-06-21 (×2): 10 mg via INTRAVENOUS
  Filled 2018-06-20: qty 4

## 2018-06-20 MED ORDER — ACETAMINOPHEN 10 MG/ML IV SOLN
1000.0000 mg | Freq: Once | INTRAVENOUS | Status: AC
  Administered 2018-06-20: 1000 mg via INTRAVENOUS
  Filled 2018-06-20: qty 100

## 2018-06-20 MED ORDER — HYDRALAZINE HCL 20 MG/ML IJ SOLN
10.0000 mg | Freq: Three times a day (TID) | INTRAMUSCULAR | Status: DC | PRN
  Administered 2018-06-20: 10 mg via INTRAVENOUS
  Filled 2018-06-20: qty 1

## 2018-06-20 MED ORDER — ACETAMINOPHEN 160 MG/5ML PO SOLN
650.0000 mg | ORAL | Status: DC | PRN
  Administered 2018-06-25 – 2018-06-27 (×2): 650 mg
  Filled 2018-06-20 (×2): qty 20.3

## 2018-06-20 MED ORDER — SENNOSIDES-DOCUSATE SODIUM 8.6-50 MG PO TABS: 1.0000 | ORAL_TABLET | Freq: Every evening | ORAL | Status: DC | PRN

## 2018-06-20 MED ORDER — KETOROLAC TROMETHAMINE 15 MG/ML IJ SOLN: 15.0000 mg | Freq: Once | INTRAMUSCULAR | Status: DC

## 2018-06-20 MED ORDER — HEPARIN SODIUM (PORCINE) 5000 UNIT/ML IJ SOLN
5000.0000 [IU] | Freq: Three times a day (TID) | INTRAMUSCULAR | Status: DC
  Administered 2018-06-21 – 2018-06-28 (×23): 5000 [IU] via SUBCUTANEOUS
  Filled 2018-06-20 (×24): qty 1

## 2018-06-20 MED ORDER — METOCLOPRAMIDE HCL 5 MG/ML IJ SOLN
10.0000 mg | Freq: Once | INTRAMUSCULAR | Status: AC
  Administered 2018-06-20: 10 mg via INTRAVENOUS
  Filled 2018-06-20: qty 2

## 2018-06-20 MED ORDER — LEVETIRACETAM IN NACL 1000 MG/100ML IV SOLN
1000.0000 mg | Freq: Once | INTRAVENOUS | Status: DC
  Filled 2018-06-20: qty 100

## 2018-06-20 MED ORDER — HYDRALAZINE HCL 20 MG/ML IJ SOLN: 10.0000 mg | Freq: Once | INTRAMUSCULAR | Status: DC

## 2018-06-20 MED ORDER — LEVETIRACETAM IN NACL 1000 MG/100ML IV SOLN
1000.0000 mg | INTRAVENOUS | Status: AC
  Administered 2018-06-21: 1000 mg via INTRAVENOUS

## 2018-06-20 MED ORDER — LABETALOL HCL 5 MG/ML IV SOLN
10.0000 mg | Freq: Once | INTRAVENOUS | Status: DC
  Filled 2018-06-20: qty 4

## 2018-06-20 MED ORDER — DIPHENHYDRAMINE HCL 50 MG/ML IJ SOLN: 12.5000 mg | Freq: Once | INTRAMUSCULAR | Status: DC

## 2018-06-20 MED ORDER — STROKE: EARLY STAGES OF RECOVERY BOOK
Freq: Once | Status: DC
  Filled 2018-06-20: qty 1

## 2018-06-20 MED ORDER — FOLIC ACID 5 MG/ML IJ SOLN
1.0000 mg | Freq: Every day | INTRAMUSCULAR | Status: DC
  Administered 2018-06-21 – 2018-06-25 (×5): 1 mg via INTRAVENOUS
  Filled 2018-06-20 (×6): qty 0.2

## 2018-06-20 MED ORDER — LORAZEPAM 2 MG/ML IJ SOLN
INTRAMUSCULAR | Status: AC
  Administered 2018-06-21: 2 mg
  Filled 2018-06-20: qty 1

## 2018-06-20 MED ORDER — ASPIRIN 300 MG RE SUPP
300.0000 mg | Freq: Every day | RECTAL | Status: DC
  Administered 2018-06-21 – 2018-06-22 (×2): 300 mg via RECTAL
  Filled 2018-06-20 (×2): qty 1

## 2018-06-20 MED ORDER — SODIUM CHLORIDE 0.9 % IV SOLN
INTRAVENOUS | Status: DC
  Administered 2018-06-20 – 2018-06-23 (×3): via INTRAVENOUS
  Administered 2018-06-24: 500 mL via INTRAVENOUS

## 2018-06-20 MED ORDER — ASPIRIN 325 MG PO TABS
325.0000 mg | ORAL_TABLET | Freq: Every day | ORAL | Status: DC
  Administered 2018-06-20 – 2018-06-25 (×4): 325 mg via ORAL
  Filled 2018-06-20 (×4): qty 1

## 2018-06-20 MED ORDER — CLEVIDIPINE BUTYRATE 0.5 MG/ML IV EMUL
0.0000 mg/h | INTRAVENOUS | Status: DC
  Administered 2018-06-20: 1 mg/h via INTRAVENOUS
  Administered 2018-06-21 – 2018-06-22 (×3): 4 mg/h via INTRAVENOUS
  Administered 2018-06-22: 6 mg/h via INTRAVENOUS
  Administered 2018-06-22: 3 mg/h via INTRAVENOUS
  Administered 2018-06-23: 18 mg/h via INTRAVENOUS
  Administered 2018-06-23: 12 mg/h via INTRAVENOUS
  Administered 2018-06-23: 18 mg/h via INTRAVENOUS
  Administered 2018-06-23: 9 mg/h via INTRAVENOUS
  Administered 2018-06-23: 12 mg/h via INTRAVENOUS
  Administered 2018-06-23: 10 mg/h via INTRAVENOUS
  Administered 2018-06-24: 20 mg/h via INTRAVENOUS
  Administered 2018-06-24: 16 mg/h via INTRAVENOUS
  Administered 2018-06-24: 14 mg/h via INTRAVENOUS
  Administered 2018-06-24: 18 mg/h via INTRAVENOUS
  Administered 2018-06-24: 20 mg/h via INTRAVENOUS
  Administered 2018-06-24: 14 mg/h via INTRAVENOUS
  Administered 2018-06-24: 21 mg/h via INTRAVENOUS
  Administered 2018-06-24: 16 mg/h via INTRAVENOUS
  Administered 2018-06-25 (×5): 21 mg/h via INTRAVENOUS
  Administered 2018-06-25: 13 mg/h via INTRAVENOUS
  Administered 2018-06-25: 21 mg/h via INTRAVENOUS
  Administered 2018-06-25: 14 mg/h via INTRAVENOUS
  Administered 2018-06-26: 17 mg/h via INTRAVENOUS
  Administered 2018-06-26: 18 mg/h via INTRAVENOUS
  Administered 2018-06-26: 14 mg/h via INTRAVENOUS
  Filled 2018-06-20 (×2): qty 100
  Filled 2018-06-20 (×2): qty 50
  Filled 2018-06-20 (×4): qty 100
  Filled 2018-06-20: qty 50
  Filled 2018-06-20 (×7): qty 100
  Filled 2018-06-20: qty 50
  Filled 2018-06-20: qty 100
  Filled 2018-06-20: qty 50
  Filled 2018-06-20: qty 100
  Filled 2018-06-20: qty 50
  Filled 2018-06-20: qty 100
  Filled 2018-06-20: qty 50
  Filled 2018-06-20 (×9): qty 100
  Filled 2018-06-20 (×2): qty 50
  Filled 2018-06-20 (×3): qty 100

## 2018-06-20 NOTE — ED Notes (Signed)
Neurologist and MD updated pt hypertensive and restless. No new orders

## 2018-06-20 NOTE — Significant Event (Addendum)
Brief hx: Pt was admitted for weakness and confusion after a fall. CT was negative for bleed or stroke. However, pt could not lie still for MRI. He also has hypertensive urgency which was being managed with Labetalol and Hydralazine IV, but had not responded well to treatment. RN paged me earlier about BP and Hydralazine was given. He had just had Labetalol. Then, RN went in and pt was shaking with upwards gaze and was unresponsive.  NP to bedside at 2350. Dr. Lorraine Lax with neuro already there. Ativan 2mg  was given IV along with Keppra. No break in seizure. Another 2mg  Ativan IV given. Pt was then intubated by ED MD because of continued unresponsiveness and decreased ability to protect airway. Pt is going to MRI after intubation. See neuro note and orders. NP attemped to call family, but no one answered. It is also noted on chart that no family was at bedside during admission.  Called PCCM for admit to ICU/consult. PCCM came-see consult note-PCCM will assume care at this point.   ? CVA vs PRES. MRI pending.  KJKG, NP [facility]   Total critical care time: Start 2345  End 0045   For a total of 60 Mins.  Critical care time was exclusive of separately billable procedures and treating other patients. Critical care was necessary to treat or prevent imminent or life-threatening deterioration. Critical care was time spent personally by me on the following activities: development of treatment plan with patient and/or surrogate as well as nursing, discussions with consultants, evaluation of patient's response to treatment, examination of patient, obtaining history from patient or surrogate, ordering and performing treatments and interventions, ordering and review of laboratory studies, ordering and review of radiographic studies, pulse oximetry and re-evaluation of patient's condition.

## 2018-06-20 NOTE — ED Notes (Signed)
Patient transported to MRI 

## 2018-06-20 NOTE — ED Notes (Signed)
Pt removed gown, covered with blankets. Attempting to get oob. Nurse or tech outside of room at all times.  Redirected pt

## 2018-06-20 NOTE — ED Notes (Signed)
This RN was monitoring assigned RN's patients when pt yelled and fell headfirst off the end of his of bed. Pt was in a room close to nurse's station with door open. Observed to be rolling around previously but not facing the direction he fell. EDP was brought to bedside for immediate evaluation. When asked why he fell, pt told MD he "needed to go to the bathroom." Pt had had episode of incontinence about twenty minutes prior and did not call out either on the call light or vocally

## 2018-06-20 NOTE — ED Triage Notes (Signed)
Pt in with caregiver c/o headache and left sided weakness that was first noticed around 11am yesterday, today she went to check on him and reports his behavior is not at baseline, pt grasping head c/o pain, left arm is weaker than before, pt answers questions but is unable to give true LSN

## 2018-06-20 NOTE — ED Notes (Signed)
Unable to obtain BP. Patient is restless and refused BP cuff

## 2018-06-20 NOTE — ED Notes (Signed)
MD updated htn. New orders. Give 10mg  labetolol if HR greater than 70. Wait 15, If still about 180 sbp give 10mg  labetolol for HR greater than 70

## 2018-06-20 NOTE — ED Notes (Signed)
Pt constantly removing gown, leads,  and  Attempting to get out of bed. Pt alert to self, place , time and situation. Redirected to bed. Pt continues to remove clothing and leads

## 2018-06-20 NOTE — ED Notes (Signed)
Pt remived ekg leads and spo2, tangled in gown. Redressed pt and hooked back up to monitor
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
## 2018-06-20 NOTE — Progress Notes (Signed)
Signed out by day team to follow repeat CTH Repeat CTH unchanged from prior same day CT. RN reports that pt remains hypertensive. BP goal<180. Management of HTN urgency per primary team. Neurology will follow. Please follow prior recs per Dr. Arrie Eastern consult note from same day. -- Amie Portland, MD [facility] Pager: [phone] If 7pm to 7am, please call on call as listed on AMION.

## 2018-06-20 NOTE — ED Notes (Signed)
Neurologist with new orders for 5mg  haldol IV and PRN hydralazine

## 2018-06-20 NOTE — ED Notes (Signed)
Pt continues to remove leads and moving towards edge of bed.  Re dressed, redirected. Tech or RN outside of room at all times
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
## 2018-06-20 NOTE — Progress Notes (Signed)
Pt tried to climb out of scanner during MRI, Pt tossing and turning, tried to hold pt still long enough to obtain DWI but unsuccessful.  MRI will be glad to get him back for another attempt once pt has calmed down.  RN aware and stated pt couldn't get any additional meds at this time.
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
## 2018-06-21 ENCOUNTER — Inpatient Hospital Stay (HOSPITAL_COMMUNITY): Payer: Medicare Other

## 2018-06-21 DIAGNOSIS — F05 Delirium due to known physiological condition: Secondary | ICD-10-CM | POA: Diagnosis present

## 2018-06-21 DIAGNOSIS — N184 Chronic kidney disease, stage 4 (severe): Secondary | ICD-10-CM | POA: Diagnosis present

## 2018-06-21 DIAGNOSIS — R0689 Other abnormalities of breathing: Secondary | ICD-10-CM | POA: Diagnosis not present

## 2018-06-21 DIAGNOSIS — R29705 NIHSS score 5: Secondary | ICD-10-CM | POA: Diagnosis present

## 2018-06-21 DIAGNOSIS — K861 Other chronic pancreatitis: Secondary | ICD-10-CM | POA: Diagnosis not present

## 2018-06-21 DIAGNOSIS — C9210 Chronic myeloid leukemia, BCR/ABL-positive, not having achieved remission: Secondary | ICD-10-CM | POA: Diagnosis present

## 2018-06-21 DIAGNOSIS — R4182 Altered mental status, unspecified: Secondary | ICD-10-CM | POA: Diagnosis present

## 2018-06-21 DIAGNOSIS — D631 Anemia in chronic kidney disease: Secondary | ICD-10-CM | POA: Diagnosis present

## 2018-06-21 DIAGNOSIS — I639 Cerebral infarction, unspecified: Secondary | ICD-10-CM

## 2018-06-21 DIAGNOSIS — I161 Hypertensive emergency: Secondary | ICD-10-CM | POA: Diagnosis present

## 2018-06-21 DIAGNOSIS — R0789 Other chest pain: Secondary | ICD-10-CM | POA: Diagnosis not present

## 2018-06-21 DIAGNOSIS — I6783 Posterior reversible encephalopathy syndrome: Secondary | ICD-10-CM | POA: Diagnosis present

## 2018-06-21 DIAGNOSIS — R569 Unspecified convulsions: Secondary | ICD-10-CM

## 2018-06-21 DIAGNOSIS — D509 Iron deficiency anemia, unspecified: Secondary | ICD-10-CM | POA: Diagnosis not present

## 2018-06-21 DIAGNOSIS — J90 Pleural effusion, not elsewhere classified: Secondary | ICD-10-CM | POA: Diagnosis not present

## 2018-06-21 DIAGNOSIS — G9340 Encephalopathy, unspecified: Secondary | ICD-10-CM

## 2018-06-21 DIAGNOSIS — I160 Hypertensive urgency: Secondary | ICD-10-CM | POA: Diagnosis not present

## 2018-06-21 DIAGNOSIS — E870 Hyperosmolality and hypernatremia: Secondary | ICD-10-CM | POA: Diagnosis not present

## 2018-06-21 DIAGNOSIS — I472 Ventricular tachycardia: Secondary | ICD-10-CM | POA: Diagnosis not present

## 2018-06-21 DIAGNOSIS — K860 Alcohol-induced chronic pancreatitis: Secondary | ICD-10-CM | POA: Diagnosis not present

## 2018-06-21 DIAGNOSIS — G40901 Epilepsy, unspecified, not intractable, with status epilepticus: Secondary | ICD-10-CM | POA: Diagnosis not present

## 2018-06-21 DIAGNOSIS — K2940 Chronic atrophic gastritis without bleeding: Secondary | ICD-10-CM | POA: Diagnosis present

## 2018-06-21 DIAGNOSIS — Z89611 Acquired absence of right leg above knee: Secondary | ICD-10-CM | POA: Diagnosis not present

## 2018-06-21 DIAGNOSIS — J9601 Acute respiratory failure with hypoxia: Secondary | ICD-10-CM | POA: Diagnosis not present

## 2018-06-21 DIAGNOSIS — E440 Moderate protein-calorie malnutrition: Secondary | ICD-10-CM | POA: Diagnosis present

## 2018-06-21 DIAGNOSIS — G9341 Metabolic encephalopathy: Secondary | ICD-10-CM | POA: Diagnosis not present

## 2018-06-21 DIAGNOSIS — R64 Cachexia: Secondary | ICD-10-CM | POA: Diagnosis present

## 2018-06-21 DIAGNOSIS — R29810 Facial weakness: Secondary | ICD-10-CM | POA: Diagnosis present

## 2018-06-21 DIAGNOSIS — N179 Acute kidney failure, unspecified: Secondary | ICD-10-CM | POA: Diagnosis present

## 2018-06-21 DIAGNOSIS — Z681 Body mass index (BMI) 19 or less, adult: Secondary | ICD-10-CM | POA: Diagnosis not present

## 2018-06-21 DIAGNOSIS — R195 Other fecal abnormalities: Secondary | ICD-10-CM | POA: Diagnosis not present

## 2018-06-21 DIAGNOSIS — K449 Diaphragmatic hernia without obstruction or gangrene: Secondary | ICD-10-CM | POA: Diagnosis present

## 2018-06-21 DIAGNOSIS — I10 Essential (primary) hypertension: Secondary | ICD-10-CM | POA: Diagnosis not present

## 2018-06-21 DIAGNOSIS — I1310 Hypertensive heart and chronic kidney disease without heart failure, with stage 1 through stage 4 chronic kidney disease, or unspecified chronic kidney disease: Secondary | ICD-10-CM | POA: Diagnosis present

## 2018-06-21 DIAGNOSIS — G8194 Hemiplegia, unspecified affecting left nondominant side: Secondary | ICD-10-CM | POA: Diagnosis present

## 2018-06-21 DIAGNOSIS — R7989 Other specified abnormal findings of blood chemistry: Secondary | ICD-10-CM | POA: Diagnosis not present

## 2018-06-21 DIAGNOSIS — J432 Centrilobular emphysema: Secondary | ICD-10-CM | POA: Diagnosis present

## 2018-06-21 LAB — RETICULOCYTES
Immature Retic Fract: 17.3 % — ABNORMAL HIGH (ref 2.3–15.9)
RBC.: 3.78 MIL/uL — AB (ref 4.22–5.81)
Retic Count, Absolute: 117.2 10*3/uL (ref 19.0–186.0)
Retic Ct Pct: 3.1 % (ref 0.4–3.1)

## 2018-06-21 LAB — RAPID URINE DRUG SCREEN, HOSP PERFORMED
AMPHETAMINES: NOT DETECTED
Barbiturates: NOT DETECTED
Benzodiazepines: NOT DETECTED
Cocaine: NOT DETECTED
Opiates: POSITIVE — AB
Tetrahydrocannabinol: NOT DETECTED

## 2018-06-21 LAB — FERRITIN: Ferritin: 87 ng/mL (ref 24–336)

## 2018-06-21 LAB — LIPID PANEL
CHOL/HDL RATIO: 4.9 ratio
Cholesterol: 163 mg/dL (ref 0–200)
HDL: 33 mg/dL — AB (ref >40)
LDL Cholesterol: 90 mg/dL (ref 0–99)
Triglycerides: 202 mg/dL — ABNORMAL HIGH (ref <150)
VLDL: 40 mg/dL (ref 0–40)

## 2018-06-21 LAB — CBC
HCT: 29.6 % — ABNORMAL LOW (ref 39.0–52.0)
Hemoglobin: 8.9 g/dL — ABNORMAL LOW (ref 13.0–17.0)
MCH: 23.5 pg — ABNORMAL LOW (ref 26.0–34.0)
MCHC: 30.1 g/dL (ref 30.0–36.0)
MCV: 78.3 fL — ABNORMAL LOW (ref 80.0–100.0)
Platelets: 238 10*3/uL (ref 150–400)
RBC: 3.78 MIL/uL — ABNORMAL LOW (ref 4.22–5.81)
RDW: 17.5 % — ABNORMAL HIGH (ref 11.5–15.5)
WBC: 14 10*3/uL — ABNORMAL HIGH (ref 4.0–10.5)
nRBC: 0 % (ref 0.0–0.2)

## 2018-06-21 LAB — POCT I-STAT 7, (LYTES, BLD GAS, ICA,H+H)
Acid-base deficit: 5 mmol/L — ABNORMAL HIGH (ref 0.0–2.0)
Bicarbonate: 20.8 mmol/L (ref 20.0–28.0)
Calcium, Ion: 1.17 mmol/L (ref 1.15–1.40)
HCT: 26 % — ABNORMAL LOW (ref 39.0–52.0)
Hemoglobin: 8.8 g/dL — ABNORMAL LOW (ref 13.0–17.0)
O2 Saturation: 100 %
Patient temperature: 98.6
Potassium: 4.3 mmol/L (ref 3.5–5.1)
SODIUM: 139 mmol/L (ref 135–145)
TCO2: 22 mmol/L (ref 22–32)
pCO2 arterial: 39.8 mmHg (ref 32.0–48.0)
pH, Arterial: 7.326 — ABNORMAL LOW (ref 7.350–7.450)
pO2, Arterial: 511 mmHg — ABNORMAL HIGH (ref 83.0–108.0)

## 2018-06-21 LAB — IRON AND TIBC
Iron: 15 ug/dL — ABNORMAL LOW (ref 45–182)
Saturation Ratios: 7 % — ABNORMAL LOW (ref 17.9–39.5)
TIBC: 221 ug/dL — ABNORMAL LOW (ref 250–450)
UIBC: 206 ug/dL

## 2018-06-21 LAB — BASIC METABOLIC PANEL
Anion gap: 14 (ref 5–15)
BUN: 30 mg/dL — ABNORMAL HIGH (ref 8–23)
CALCIUM: 8.2 mg/dL — AB (ref 8.9–10.3)
CO2: 18 mmol/L — ABNORMAL LOW (ref 22–32)
Chloride: 109 mmol/L (ref 98–111)
Creatinine, Ser: 3.53 mg/dL — ABNORMAL HIGH (ref 0.61–1.24)
GFR, EST AFRICAN AMERICAN: 19 mL/min — AB (ref >60)
GFR, EST NON AFRICAN AMERICAN: 16 mL/min — AB (ref >60)
Glucose, Bld: 115 mg/dL — ABNORMAL HIGH (ref 70–99)
Potassium: 4.7 mmol/L (ref 3.5–5.1)
Sodium: 141 mmol/L (ref 135–145)

## 2018-06-21 LAB — ETHANOL: Alcohol, Ethyl (B): <10 mg/dL (ref <10)

## 2018-06-21 LAB — VITAMIN B12: VITAMIN B 12: 925 pg/mL — AB (ref 180–914)

## 2018-06-21 LAB — TRIGLYCERIDES: Triglycerides: 220 mg/dL — ABNORMAL HIGH (ref <150)

## 2018-06-21 LAB — MAGNESIUM: Magnesium: 1.7 mg/dL (ref 1.7–2.4)

## 2018-06-21 LAB — CREATININE, SERUM
Creatinine, Ser: 3.35 mg/dL — ABNORMAL HIGH (ref 0.61–1.24)
GFR calc Af Amer: 20 mL/min — ABNORMAL LOW (ref >60)
GFR calc non Af Amer: 17 mL/min — ABNORMAL LOW (ref >60)

## 2018-06-21 LAB — ECHOCARDIOGRAM COMPLETE
Height: 69 in
Weight: 2102.31 oz

## 2018-06-21 LAB — GLUCOSE, CAPILLARY: Glucose-Capillary: 101 mg/dL — ABNORMAL HIGH (ref 70–99)

## 2018-06-21 LAB — MRSA PCR SCREENING: MRSA by PCR: NEGATIVE

## 2018-06-21 LAB — FOLATE: Folate: 12.2 ng/mL (ref >5.9)

## 2018-06-21 LAB — PHOSPHORUS: Phosphorus: 4.9 mg/dL — ABNORMAL HIGH (ref 2.5–4.6)

## 2018-06-21 MED ORDER — FENTANYL CITRATE (PF) 100 MCG/2ML IJ SOLN
50.0000 ug | INTRAMUSCULAR | Status: DC | PRN
  Administered 2018-06-22 – 2018-06-24 (×6): 50 ug via INTRAVENOUS
  Filled 2018-06-21 (×6): qty 2

## 2018-06-21 MED ORDER — PROPOFOL 1000 MG/100ML IV EMUL
INTRAVENOUS | Status: AC
  Filled 2018-06-21: qty 100

## 2018-06-21 MED ORDER — ORAL CARE MOUTH RINSE
15.0000 mL | OROMUCOSAL | Status: DC
  Administered 2018-06-21 – 2018-06-24 (×31): 15 mL via OROMUCOSAL

## 2018-06-21 MED ORDER — SUCCINYLCHOLINE CHLORIDE 20 MG/ML IJ SOLN
100.0000 mg | Freq: Once | INTRAMUSCULAR | Status: AC
  Administered 2018-06-21: 100 mg via INTRAVENOUS
  Filled 2018-06-21: qty 5

## 2018-06-21 MED ORDER — PANTOPRAZOLE SODIUM 40 MG IV SOLR
40.0000 mg | Freq: Every day | INTRAVENOUS | Status: DC
  Administered 2018-06-21 – 2018-06-25 (×5): 40 mg via INTRAVENOUS
  Filled 2018-06-21 (×5): qty 40

## 2018-06-21 MED ORDER — ETOMIDATE 2 MG/ML IV SOLN
20.0000 mg | Freq: Once | INTRAVENOUS | Status: AC
  Administered 2018-06-21: 20 mg via INTRAVENOUS

## 2018-06-21 MED ORDER — ALLOPURINOL 300 MG PO TABS
300.0000 mg | ORAL_TABLET | Freq: Every day | ORAL | Status: DC
  Administered 2018-06-23: 300 mg
  Filled 2018-06-21: qty 1

## 2018-06-21 MED ORDER — PROPOFOL 1000 MG/100ML IV EMUL
5.0000 ug/kg/min | INTRAVENOUS | Status: DC
  Administered 2018-06-21: 5 ug/kg/min via INTRAVENOUS

## 2018-06-21 MED ORDER — PROPOFOL 1000 MG/100ML IV EMUL
5.0000 ug/kg/min | INTRAVENOUS | Status: DC
  Administered 2018-06-21: 60 ug/kg/min via INTRAVENOUS
  Administered 2018-06-21: 30 ug/kg/min via INTRAVENOUS
  Administered 2018-06-21: 65 ug/kg/min via INTRAVENOUS
  Administered 2018-06-21: 30 ug/kg/min via INTRAVENOUS
  Administered 2018-06-21: 5 ug/kg/min via INTRAVENOUS
  Administered 2018-06-22: 70 ug/kg/min via INTRAVENOUS
  Administered 2018-06-22: 60 ug/kg/min via INTRAVENOUS
  Administered 2018-06-22: 40 ug/kg/min via INTRAVENOUS
  Administered 2018-06-22: 30 ug/kg/min via INTRAVENOUS
  Administered 2018-06-23: 65 ug/kg/min via INTRAVENOUS
  Administered 2018-06-23: 80 ug/kg/min via INTRAVENOUS
  Administered 2018-06-23 (×2): 70 ug/kg/min via INTRAVENOUS
  Administered 2018-06-23: 80 ug/kg/min via INTRAVENOUS
  Administered 2018-06-23 (×2): 70 ug/kg/min via INTRAVENOUS
  Administered 2018-06-24 (×2): 80 ug/kg/min via INTRAVENOUS
  Filled 2018-06-21 (×7): qty 100
  Filled 2018-06-21: qty 200
  Filled 2018-06-21 (×9): qty 100

## 2018-06-21 MED ORDER — SIMVASTATIN 20 MG PO TABS
40.0000 mg | ORAL_TABLET | Freq: Every day | ORAL | Status: DC
  Administered 2018-06-22 – 2018-06-23 (×2): 40 mg
  Filled 2018-06-21 (×2): qty 2

## 2018-06-21 MED ORDER — COLCHICINE 0.6 MG PO TABS
0.6000 mg | ORAL_TABLET | Freq: Two times a day (BID) | ORAL | Status: DC
  Filled 2018-06-21: qty 1

## 2018-06-21 MED ORDER — HYDRALAZINE HCL 20 MG/ML IJ SOLN
10.0000 mg | Freq: Once | INTRAMUSCULAR | Status: AC
  Administered 2018-06-21: 10 mg via INTRAVENOUS

## 2018-06-21 MED ORDER — CHLORHEXIDINE GLUCONATE 0.12% ORAL RINSE (MEDLINE KIT)
15.0000 mL | Freq: Two times a day (BID) | OROMUCOSAL | Status: DC
  Administered 2018-06-21 – 2018-06-24 (×7): 15 mL via OROMUCOSAL

## 2018-06-21 MED ORDER — THIAMINE HCL 100 MG/ML IJ SOLN
100.0000 mg | Freq: Every day | INTRAMUSCULAR | Status: DC
  Administered 2018-06-24 – 2018-06-25 (×2): 100 mg via INTRAVENOUS
  Filled 2018-06-21 (×2): qty 2

## 2018-06-21 MED ORDER — FENTANYL CITRATE (PF) 100 MCG/2ML IJ SOLN
INTRAMUSCULAR | Status: AC
  Filled 2018-06-21: qty 2

## 2018-06-21 MED ORDER — LEVETIRACETAM IN NACL 500 MG/100ML IV SOLN
500.0000 mg | Freq: Two times a day (BID) | INTRAVENOUS | Status: DC
  Administered 2018-06-21 – 2018-06-25 (×9): 500 mg via INTRAVENOUS
  Filled 2018-06-21 (×9): qty 100

## 2018-06-21 MED ORDER — FENTANYL CITRATE (PF) 100 MCG/2ML IJ SOLN
50.0000 ug | INTRAMUSCULAR | Status: AC | PRN
  Administered 2018-06-21 – 2018-06-22 (×3): 50 ug via INTRAVENOUS
  Filled 2018-06-21 (×2): qty 2

## 2018-06-21 MED ORDER — THIAMINE HCL 100 MG/ML IJ SOLN
500.0000 mg | Freq: Three times a day (TID) | INTRAVENOUS | Status: AC
  Administered 2018-06-21 – 2018-06-23 (×9): 500 mg via INTRAVENOUS
  Filled 2018-06-21 (×10): qty 5

## 2018-06-21 NOTE — Progress Notes (Signed)
Interim Progress Note  S: called for unresponsiveness and right sided forced gaze that started around 11:37pm. He was seen within 10 min of the call and continued to have forced right gaze. Patient unable to provide any history or system.  O: Systolic blood pressure 737T to 240s at the time of this encounter Respiratory rate 22 Heart rate 75 General exam: Obtunded not responding to commands with eyes open and forced right gaze. HEENT: Normocephalic atraumatic, no neck stiffness. CVs: S1-2 heard, regular rhythm Respiratory: Equal air entry bilaterally, saturating 100%, with puffy breathing Extremities: Right AKA, moving all 3 other extremities spontaneously Neurological exam Patient is nonresponsive with eyes open. Does not follow any commands. Is nonverbal Cranial nerves: Pupils are equal round reactive to light bilaterally, forced rightward gaze, does not blink to threat from either side, face is symmetric Motor exam: Spontaneously moving both upper extremities and to noxious stimulation attempts to localize from both upper extremities.  Withdraws left lower extremity noxious simulation.  Right AKA. Sensory exam: As above Coordination gait cannot be tested  Repeat exam after intubation-no forced gaze, no response to noxious stimulation due to paralytics on board.  I have independently reviewed imaging.  2 CT scans done this afternoon and evening were unremarkable for any acute change or evidence of bleed or evolving stroke. Labs are deranged with deranged renal function and worsening creatinine to 74.47.  A: 73 year old man with a right AKA, history of MI, hypertension, gout, arthritis and anxiety as well as history of alcohol abuse and alcohol withdrawal delirium in May 2018, was seen earlier in the day by the neurology team for concern of severe headache and left-sided weakness.  He was awake and able to say his name at the time of the encounter with the daytime neurologist but at this  time he appears to be having a forced rightward gaze and not responsive to name and minimal responsiveness to noxious stimulation.  I am suspecting that he probably is in status epilepticus secondary to hypertensive emergency/posterior reversible encephalopathy syndrome.  Stroke remains in the differentials.  He needed to be intubated after receiving 4 mg of Ativan.  His blood pressures also remained critically high in the 240s requiring multiple doses of hydralazine and labetalol and finally Cleviprex drip.  His blood pressure started to come down after Cleviprex and after intubation when he received propofol.  Impression Evaluate for status epilepticus Evaluate for seizures -?Alcohol withdrawal Evaluate for hypertensive emergency/posterior reversible encephalopathy syndrome   Recommendations: Currently intubated.  Transfer to ICU.  Appreciate PCCM evaluation and assistance. Loaded with Keppra 1 g IV.  Continue with Keppra 500 twice daily. Stat MRI brain Check thiamine level Replace thiamine 500 mg 3 times daily x3 days after drawing the level. Will call the technologist in for EEG once MRI is completed.   CRITICAL CARE ATTESTATION Performed by: Amie Portland, MD Total critical care time: 45 minutes Critical care time was exclusive of separately billable procedures and treating other patients and/or supervising APPs/Residents/Students Critical care was necessary to treat or prevent imminent or life-threatening deterioration due to status epilepticus, concern for status.  This patient is critically ill and at significant risk for neurological worsening and/or death and care requires constant monitoring. Critical care was time spent personally by me on the following activities: development of treatment plan with patient and/or surrogate as well as nursing, discussions with consultants, evaluation of patient's response to treatment, examination of patient, obtaining history from patient or  surrogate, ordering and performing treatments  and interventions, ordering and review of laboratory studies, ordering and review of radiographic studies, pulse oximetry, re-evaluation of patient's condition, participation in multidisciplinary rounds and medical decision making of high complexity in the care of this patient.    ADDENDUM MRI reviewed. Consistent with possible PRES vs sever WM microischemic changes. No acute stroke. Reinforces Seizure and status epilepticus as the diagnosis with likely HTN as etiology. No forced gaze. Moving all three extremities spontaneously, still on Propofol after intubation. Will get  EEG first thing in the AM. Will treat as above.  -- Amie Portland, MD [facility] Pager: [phone] If 7pm to 7am, please call on call as listed on AMION.

## 2018-06-21 NOTE — Progress Notes (Signed)
  Echocardiogram 2D Echocardiogram has been performed.  [name] 06/21/2018, 10:59 AM

## 2018-06-21 NOTE — ED Notes (Signed)
Patient transported to MRI 

## 2018-06-21 NOTE — Procedures (Signed)
History: 73 yo M being evaluated for seizures  Sedation: Propofol  Technique: This is a 21 channel STAT scalp EEG performed at the bedside with bipolar and monopolar montages arranged in accordance to the international 10/20 system of electrode placement. One channel was dedicated to EKG recording.    Background: The background consists of predominantly low voltage irregular delta activity with some superimposed anteriorly predominant beta activity. There are occasional runs of alpha resembling sleep spindles.  Photic stimulation: Physiologic driving is not performed  EEG Abnormalities: 1) Sedated EEG  Clinical Interpretation: This EEG is consistent with the patient's sedated state. There was no seizure or seizure predisposition recorded on this study. Please note that lack of epileptiform activity on EEG does not preclude the possibility of epilepsy.   Roland Rack, MD [facility] [phone]  If 7pm- 7am, please page neurology on call as listed in [place].

## 2018-06-21 NOTE — Progress Notes (Signed)
Informed MD Loanne Drilling of pt's continuous high fever despite tylenol and ice packs. See new orders.

## 2018-06-21 NOTE — Progress Notes (Signed)
PT Cancellation Note  Patient Details Name: Halden Phegley MRN: [id number] DOB: April 18, 1946   Cancelled Treatment:    Reason Eval/Treat Not Completed: Medical issues which prohibited therapy(Nurse asked PT to HOLD today due to pt still sedated. )   Godfrey Pick Ranelle Auker 06/21/2018, 11:29 AM Amanda [facility] Pager:  [phone]  Office:  [phone]

## 2018-06-21 NOTE — Progress Notes (Signed)
Reason for consult: Status epilepticus/PRES  Subjective: Patient had an uneventful night.  Blood pressures remained elevated despite hydralazine, labetalol pushes.  Patient then went into status epilepticus and was intubated for airway protection.  Patient has not had no further seizures since then.   ROS:  Unable to obtain due patient is intubated  Examination  Vital signs in last 24 hours: Temp:  [99.5 F (37.5 C)-101.7 F (38.7 C)] 101 F (38.3 C) (02/13 1600) Pulse Rate:  [67-82] 73 (02/13 1600) Resp:  [14-31] 18 (02/13 1600) BP: (113-268)/(63-180) 158/75 (02/13 1600) SpO2:  [83 %-100 %] 100 % (02/13 1600) FiO2 (%):  [40 %-100 %] 40 % (02/13 1547) Weight:  [59.6 kg-62.3 kg] 59.6 kg (02/13 0247)  General: lying in bed CVS: pulse-normal rate and rhythm RS: breathing comfortably Extremities: normal   Neuro: Intubated, minimal sedation  Mental Status: Patient does not respond to verbal stimuli.  Moves 3  extremities to sternal rub.  Does not follow commands.  Cranial Nerves: II: patient does not respond confrontation bilaterally, pupils not reactive, right 4 mm, left 4 mm III,IV,VI: dolls present, no forced gaze devation  VIII: patient does not respond to verbal stimuli IX,X: gag reflex present  XI: trapezius strength unable to test bilaterally XII: tongue strength unable to test Motor: Extremities moving spontaneously, R BKA  Cerebellar: Unable to perform   Basic Metabolic Panel: Recent Labs  Lab 06/20/18 1250 06/20/18 2343 06/21/18 0104 06/21/18 0352  NA 142  --  139 141  K 4.9  --  4.3 4.7  CL 109  --   --  109  CO2 21*  --   --  18*  GLUCOSE 104*  --   --  115*  BUN 27*  --   --  30*  CREATININE 3.28* 3.35*  --  3.53*  CALCIUM 8.2*  --   --  8.2*  MG  --   --   --  1.7  PHOS  --   --   --  4.9*    CBC: Recent Labs  Lab 06/20/18 1250 06/21/18 0104 06/21/18 0352  WBC 7.3  --  14.0*  NEUTROABS 5.3  --   --   HGB 9.8* 8.8* 8.9*  HCT 33.6* 26.0*  29.6*  MCV 80.0  --  78.3*  PLT 221  --  238     Coagulation Studies: Recent Labs    06/20/18 1250  LABPROT 14.0  INR 1.09   EEG:    EEG Abnormalities: 1) Sedated EEG  Clinical Interpretation: This EEG is consistent with the patient's sedated state. There was no seizure or seizure predisposition recorded on this study. Please note that lack of epileptiform activity on EEG does not preclude the possibility of epilepsy.    ASSESSMENT AND PLAN  Posterior reversible encephalopathy syndrome Hypertensive emergency Status Epilepticus-resolved   Recommendations Continue aggressive blood pressure management, goal BP is less than 093 systolic Wean sedation as tolerated Continue Keppra 500 mg twice daily Seizure precautions Appreciate critical care team assistance in managing evaluation  for fever, ventilation  This patient is neurologically critically ill due to presenting with status epilepticus secondary due to press/hypertensive emergency.  He is at risk for significant risk of neurological worsening from cerebral edema, heart failure,  infection, respiratory failure and further seizures with complications of intubation and ICU stay.  This patient's care requires constant monitoring of vital signs, hemodynamics, respiratory and cardiac monitoring, review of multiple databases, reviewing EEG, neurological assessment, discussion with family,  other specialists and medical decision making of high complexity.  I spent 40  minutes of neurocritical time in the care of this patient.    Karena Addison Aroor [facility] Pager Number [phone] For questions after 7pm please refer to AMION to reach the Neurologist on call

## 2018-06-21 NOTE — ED Notes (Signed)
MD updated with continued hypertension at 2335. New orders charted. At 2337 pt presented with right sided gaze and was non responsive. Neuro and EDP at bedside to assess.

## 2018-06-21 NOTE — Progress Notes (Signed)
EEG completed, results pending. 

## 2018-06-21 NOTE — ED Provider Notes (Signed)
12:18 AM Patient admitted by AM ED provider to the hospitalist service with neurology consulting. While pending admission bed, patient noted to have onset of new gaze deficit with decreased mentation, GCS <8. Intubated in the ED for airway protection. See note from MD [place] for further detail, in addition to notes from both hospitalist and neurology service. Started on Cleviprex in the ED for blood pressure control.  INTUBATION Performed by: Antonietta Breach  Required items: required blood products, implants, devices, and special equipment available Patient identity confirmed: provided demographic data and hospital-assigned identification number Time out: Immediately prior to procedure a "time out" was called to verify the correct patient, procedure, equipment, support staff and site/side marked as required.  Indications: GCS <8  Intubation method: Glidescope Laryngoscopy   Preoxygenation: BVM  Sedatives: 20mg  Etomidate Paralytic: 100mg  Succinylcholine  Tube Size: 8.0 cuffed  Post-procedure assessment: chest rise and ETCO2 monitor Breath sounds: equal and absent over the epigastrium Tube secured with: ETT holder Chest x-ray interpreted by radiologist and me.  Chest x-ray findings: Endotracheal tube in appropriate position  Patient tolerated the procedure well with no immediate complications.      Antonietta Breach, PA-C 06/21/18 0043    Ezequiel Essex, MD 06/21/18 1027    Ezequiel Essex, MD 06/21/18 [phone]

## 2018-06-21 NOTE — Progress Notes (Signed)
OT Cancellation Note  Patient Details Name: [name] MRN: [id number] DOB: [birth year]   Cancelled Treatment:    Reason Eval/Treat Not Completed: Patient not medically ready- intubated and sedated.  Will follow and initiate OT eval when medically appropriate and able.   Delight Stare, OT Acute Rehabilitation Services Pager [phone] Office [phone]    Delight Stare 06/21/2018, 11:27 AM

## 2018-06-21 NOTE — ED Notes (Addendum)
ETT/OGT intact , IV sites intact , Propofol 10 mg / Cleviprex 4 mg iv drip infusing . Waiting for MRI .
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
## 2018-06-22 ENCOUNTER — Inpatient Hospital Stay (HOSPITAL_COMMUNITY): Payer: Medicare Other

## 2018-06-22 ENCOUNTER — Inpatient Hospital Stay: Payer: Medicare Other

## 2018-06-22 DIAGNOSIS — R569 Unspecified convulsions: Secondary | ICD-10-CM

## 2018-06-22 DIAGNOSIS — E440 Moderate protein-calorie malnutrition: Secondary | ICD-10-CM

## 2018-06-22 DIAGNOSIS — I6783 Posterior reversible encephalopathy syndrome: Principal | ICD-10-CM

## 2018-06-22 LAB — BASIC METABOLIC PANEL
Anion gap: 10 (ref 5–15)
BUN: 42 mg/dL — ABNORMAL HIGH (ref 8–23)
CALCIUM: 7.5 mg/dL — AB (ref 8.9–10.3)
CO2: 17 mmol/L — ABNORMAL LOW (ref 22–32)
Chloride: 114 mmol/L — ABNORMAL HIGH (ref 98–111)
Creatinine, Ser: 4.01 mg/dL — ABNORMAL HIGH (ref 0.61–1.24)
GFR calc Af Amer: 16 mL/min — ABNORMAL LOW (ref >60)
GFR calc non Af Amer: 14 mL/min — ABNORMAL LOW (ref >60)
Glucose, Bld: 82 mg/dL (ref 70–99)
Potassium: 4.7 mmol/L (ref 3.5–5.1)
Sodium: 141 mmol/L (ref 135–145)

## 2018-06-22 LAB — GLUCOSE, CAPILLARY
Glucose-Capillary: 84 mg/dL (ref 70–99)
Glucose-Capillary: 91 mg/dL (ref 70–99)

## 2018-06-22 LAB — PHOSPHORUS
Phosphorus: 6.3 mg/dL — ABNORMAL HIGH (ref 2.5–4.6)
Phosphorus: 6.4 mg/dL — ABNORMAL HIGH (ref 2.5–4.6)

## 2018-06-22 LAB — HEMOGLOBIN A1C
Hgb A1c MFr Bld: 5.2 % (ref 4.8–5.6)
Mean Plasma Glucose: 103 mg/dL

## 2018-06-22 LAB — MAGNESIUM
MAGNESIUM: 1.9 mg/dL (ref 1.7–2.4)
Magnesium: 1.8 mg/dL (ref 1.7–2.4)

## 2018-06-22 MED ORDER — VITAL AF 1.2 CAL PO LIQD
1000.0000 mL | ORAL | Status: DC
  Administered 2018-06-22 – 2018-06-23 (×2): 1000 mL
  Filled 2018-06-22 (×2): qty 1000

## 2018-06-22 MED ORDER — LACTATED RINGERS IV BOLUS
1000.0000 mL | Freq: Once | INTRAVENOUS | Status: AC
  Administered 2018-06-22: 1000 mL via INTRAVENOUS

## 2018-06-22 MED ORDER — COLCHICINE 0.6 MG PO TABS
0.3000 mg | ORAL_TABLET | Freq: Every day | ORAL | Status: DC
  Administered 2018-06-23: 0.3 mg
  Filled 2018-06-22: qty 0.5

## 2018-06-22 NOTE — Progress Notes (Signed)
Reason for consult: PRES  Subjective: Patient has had no further clinical seizures.  Follows commands intermittently with the nurse.  Blood pressures remained stable.   ROS: Unable to obtain due to poor mental status  Examination  Vital signs in last 24 hours: Temp:  [98.1 F (36.7 C)-99.6 F (37.6 C)] 98.9 F (37.2 C) (02/14 1830) Pulse Rate:  [60-94] 70 (02/14 2330) Resp:  [13-28] 15 (02/14 2330) BP: (110-175)/(61-93) 148/71 (02/14 2330) SpO2:  [93 %-100 %] 98 % (02/14 2330) FiO2 (%):  [30 %] 30 % (02/14 1946)  General: lying in bed CVS: pulse-normal rate and rhythm RS: breathing comfortably Extremities: normal   Neuro: intubated and sedated  MS: Awake, fixes gaze on examiner, not following commands CN: pupils equal and reactive,  EOMI, face symmetric,  Motor: Moves both upper extremities and left lower extremity with good strength, right AKA Coordination: unable to test  Gait: not tested  Basic Metabolic Panel: Recent Labs  Lab 06/20/18 1250 06/20/18 2343 06/21/18 0104 06/21/18 0352 06/22/18 0339 06/22/18 1626  NA 142  --  139 141 141  --   K 4.9  --  4.3 4.7 4.7  --   CL 109  --   --  109 114*  --   CO2 21*  --   --  18* 17*  --   GLUCOSE 104*  --   --  115* 82  --   BUN 27*  --   --  30* 42*  --   CREATININE 3.28* 3.35*  --  3.53* 4.01*  --   CALCIUM 8.2*  --   --  8.2* 7.5*  --   MG  --   --   --  1.7 1.8 1.9  PHOS  --   --   --  4.9* 6.3* 6.4*    CBC: Recent Labs  Lab 06/20/18 1250 06/21/18 0104 06/21/18 0352  WBC 7.3  --  14.0*  NEUTROABS 5.3  --   --   HGB 9.8* 8.8* 8.9*  HCT 33.6* 26.0* 29.6*  MCV 80.0  --  78.3*  PLT 221  --  238     Coagulation Studies: Recent Labs    06/20/18 1250  LABPROT 14.0  INR 1.09    Imaging Reviewed:     ASSESSMENT AND PLAN  73 year old male who presents with hypertensive crisis subsequently had prolonged seizure/Status epilepticus requiring intubation.  MRI brain consistent with PRES. EEG  performed subsequently showed no seizures.  Patient is moving briskly and fixates examiner.  Was told by nurse that he follows commands intermittently.   Posterior reversible encephalopathy syndrome Hypertensive emergency Status Epilepticus-resolved   Recommendations Continue aggressive blood pressure management, goal BP is less than 263 systolic Wean sedation as tolerated Continue Keppra on discharge, can likely discontinue after follow-up with outpatient neurologist if  remains seizure-free Seizure precautions and no driving x 67months   Follow with outpatient neurologist on discharge. Neurology will sign off.  Outpatient ophthalmology if patient continues to have visual disturbance after several weeks.    Karena Addison Nonna Renninger [facility] Pager Number [phone] For questions after 7pm please refer to AMION to reach the Neurologist on call

## 2018-06-22 NOTE — Procedures (Signed)
Cortrak  Person Inserting Tube:  Rosezetta Schlatter, RD Tube Type:  Cortrak - 43 inches Tube Location:  Left nare Initial Placement:  Stomach Secured by: Bridle Technique Used to Measure Tube Placement:  Documented cm marking at nare/ corner of mouth Cortrak Secured At:  70 cm    Cortrak Tube Team Note:  Consult received to place a Cortrak feeding tube.   No x-ray is required. RN may begin using tube.    If the tube becomes dislodged please keep the tube and contact the Cortrak team at [email or web address] (password [email or web address]) for replacement.  If after hours and replacement cannot be delayed, place a NG tube and confirm placement with an abdominal x-ray.     Jarome Matin, MS, RD, LDN, [facility] Inpatient Clinical Dietitian Pager # [phone] After hours/weekend pager # [phone]
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
## 2018-06-22 NOTE — Evaluation (Signed)
Physical Therapy Evaluation Patient Details Name: [name] MRN: [id number] DOB: 1945-05-16 Today's Date: 06/22/2018   History of Present Illness  73 year old male who presented with left-sided weakness and altered mental status. Evaluated by Neuro and CT head negative. Admitted to [facility] for hypertensive urgency. On the floor patient had a mechanical fall with repeat CT head negative. He later had seizure-like activity and was intubated.  Clinical Impression  Pt admitted with above diagnosis. Pt currently with functional limitations due to the deficits listed below (see PT Problem List). Pt responded to voice by turning head and not opening eyes.  Pt responded to cool cloth by moving bil UEs and left LE.  Otherwise did not follow any commands.  Nurse did not want EOB today.  Will follow acutely.  Pt will benefit from skilled PT to increase their independence and safety with mobility to allow discharge to the venue listed below.    Follow Up Recommendations SNF;Supervision/Assistance - 24 hour    Equipment Recommendations  Wheelchair (measurements PT);Wheelchair cushion (measurements PT)(may need wheelchair)    Recommendations for Other Services       Precautions / Restrictions Precautions Precautions: Fall Restrictions Weight Bearing Restrictions: No      Mobility  Bed Mobility               General bed mobility comments: NT as pt was bed level only due to bedrest  Transfers                    Ambulation/Gait                Stairs            Wheelchair Mobility    Modified Rankin (Stroke Patients Only)       Balance                                             Pertinent Vitals/Pain Pain Assessment: No/denies pain    Home Living Family/patient expects to be discharged to:: Private residence Living Arrangements: Alone Available Help at Discharge: Available PRN/intermittently Type of Home: Apartment Home Access: Level  entry     Home Layout: One level Home Equipment: Crutches;Shower seat Additional Comments: aide 2.5 hours day (all info taken per chart 2018), Tried to call family and no answer by daughter or by caregiver/POA.    Prior Function Level of Independence: Independent with assistive device(s)         Comments: uses crutches to get around with prosthesis, at times uses canes vs no AD per chart 2018     Hand Dominance   Dominant Hand: Right    Extremity/Trunk Assessment   Upper Extremity Assessment Upper Extremity Assessment: Defer to OT evaluation(Pt only moved bil UEs when cool cloth to forehead,tone LUE)    Lower Extremity Assessment Lower Extremity Assessment: RLE deficits/detail;LLE deficits/detail RLE Deficits / Details: AKA - no movement noted LLE Deficits / Details: Spontaneous movement of LE with cool cloth to forehead       Communication   Communication: HOH  Cognition Arousal/Alertness: Lethargic;Suspect due to medications Behavior During Therapy: Flat affect Overall Cognitive Status: Difficult to assess  General Comments General comments (skin integrity, edema, etc.): 30% FiO2, 5 PEEP, 63 bpm, 100% O2, 129/70.     Exercises General Exercises - Upper Extremity Shoulder Flexion: PROM;Both;5 reps;Supine(tone noted left UE to movement) Elbow Flexion: PROM;Both;5 reps;Supine General Exercises - Lower Extremity Ankle Circles/Pumps: PROM;Both;5 reps;Supine Heel Slides: PROM;Both;Supine;5 reps   Assessment/Plan    PT Assessment Patient needs continued PT services  PT Problem List Decreased balance;Decreased activity tolerance;Decreased strength;Decreased mobility;Decreased cognition;Decreased knowledge of use of DME;Decreased safety awareness;Decreased knowledge of precautions       PT Treatment Interventions DME instruction;Functional mobility training;Therapeutic activities;Therapeutic exercise;Balance  training;Patient/family education;Gait training    PT Goals (Current goals can be found in the Care Plan section)  Acute Rehab PT Goals Patient Stated Goal: unable to state PT Goal Formulation: With patient Time For Goal Achievement: 07/06/18 Potential to Achieve Goals: Good    Frequency Min 3X/week   Barriers to discharge Decreased caregiver support      Co-evaluation               AM-PAC PT "6 Clicks" Mobility  Outcome Measure Help needed turning from your back to your side while in a flat bed without using bedrails?: Total Help needed moving from lying on your back to sitting on the side of a flat bed without using bedrails?: Total Help needed moving to and from a bed to a chair (including a wheelchair)?: Total Help needed standing up from a chair using your arms (e.g., wheelchair or bedside chair)?: Total Help needed to walk in hospital room?: Total Help needed climbing 3-5 steps with a railing? : Total 6 Click Score: 6    End of Session Equipment Utilized During Treatment: (ventilator) Activity Tolerance: Patient limited by fatigue;Patient limited by lethargy Patient left: in bed;with call bell/phone within reach;with bed alarm set;with SCD's reapplied Nurse Communication: Mobility status;Need for lift equipment PT Visit Diagnosis: Muscle weakness (generalized) (M62.81)    Time: 1000-1015 PT Time Calculation (min) (ACUTE ONLY): 15 min   Charges:   PT Evaluation $PT Eval Moderate Complexity: [place] Pager:  [phone]  Office:  [phone]    Denice Paradise 06/22/2018, 10:51 AM

## 2018-06-22 NOTE — Progress Notes (Signed)
Patient due to void, bladder scan reflects 523mL. CCM MD informed, in & out cath ordered. Performed and obtained 4109mL out.  Candy Sledge, RN

## 2018-06-22 NOTE — Progress Notes (Signed)
OT Cancellation Note  Patient Details Name: [name] MRN: [id number] DOB: 1945/08/16   Cancelled Treatment:    Reason Eval/Treat Not Completed: Other (comment). PT did a limited bed level eval earlier after speaking to RN with pt with minimal response. Will hold on eval for now.  [place] Circle, OTR/L Acute Rehab Services Pager [phone] Office [phone]     Almon Register 06/22/2018, 1:12 PM
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
## 2018-06-23 DIAGNOSIS — J9601 Acute respiratory failure with hypoxia: Secondary | ICD-10-CM

## 2018-06-23 LAB — BASIC METABOLIC PANEL
ANION GAP: 9 (ref 5–15)
BUN: 44 mg/dL — ABNORMAL HIGH (ref 8–23)
CO2: 17 mmol/L — ABNORMAL LOW (ref 22–32)
Calcium: 7.1 mg/dL — ABNORMAL LOW (ref 8.9–10.3)
Chloride: 116 mmol/L — ABNORMAL HIGH (ref 98–111)
Creatinine, Ser: 3.98 mg/dL — ABNORMAL HIGH (ref 0.61–1.24)
GFR calc Af Amer: 16 mL/min — ABNORMAL LOW (ref >60)
GFR calc non Af Amer: 14 mL/min — ABNORMAL LOW (ref >60)
Glucose, Bld: 102 mg/dL — ABNORMAL HIGH (ref 70–99)
Potassium: 4 mmol/L (ref 3.5–5.1)
Sodium: 142 mmol/L (ref 135–145)

## 2018-06-23 LAB — GLUCOSE, CAPILLARY
Glucose-Capillary: 88 mg/dL (ref 70–99)
Glucose-Capillary: 94 mg/dL (ref 70–99)
Glucose-Capillary: 108 mg/dL — ABNORMAL HIGH (ref 70–99)
Glucose-Capillary: 101 mg/dL — ABNORMAL HIGH (ref 70–99)
Glucose-Capillary: 95 mg/dL (ref 70–99)
Glucose-Capillary: 83 mg/dL (ref 70–99)

## 2018-06-23 LAB — CULTURE, RESPIRATORY W GRAM STAIN: Culture: NORMAL

## 2018-06-23 LAB — CBC WITH DIFFERENTIAL/PLATELET
Abs Immature Granulocytes: 0.07 10*3/uL (ref 0.00–0.07)
Basophils Absolute: 0 10*3/uL (ref 0.0–0.1)
Basophils Relative: 0 %
Eosinophils Absolute: 0.2 10*3/uL (ref 0.0–0.5)
Eosinophils Relative: 3 %
HCT: 25.1 % — ABNORMAL LOW (ref 39.0–52.0)
Hemoglobin: 7.7 g/dL — ABNORMAL LOW (ref 13.0–17.0)
IMMATURE GRANULOCYTES: 1 %
Lymphocytes Relative: 12 %
Lymphs Abs: 1.2 10*3/uL (ref 0.7–4.0)
MCH: 24.2 pg — ABNORMAL LOW (ref 26.0–34.0)
MCHC: 30.7 g/dL (ref 30.0–36.0)
MCV: 78.9 fL — ABNORMAL LOW (ref 80.0–100.0)
MONOS PCT: 9 %
Monocytes Absolute: 0.8 10*3/uL (ref 0.1–1.0)
NEUTROS PCT: 75 %
Neutro Abs: 7.3 10*3/uL (ref 1.7–7.7)
Platelets: 152 10*3/uL (ref 150–400)
RBC: 3.18 MIL/uL — ABNORMAL LOW (ref 4.22–5.81)
RDW: 17.8 % — ABNORMAL HIGH (ref 11.5–15.5)
WBC: 9.6 10*3/uL (ref 4.0–10.5)
nRBC: 0 % (ref 0.0–0.2)

## 2018-06-23 LAB — PHOSPHORUS: Phosphorus: 4.8 mg/dL — ABNORMAL HIGH (ref 2.5–4.6)

## 2018-06-23 LAB — MAGNESIUM: Magnesium: 2.1 mg/dL (ref 1.7–2.4)

## 2018-06-23 MED ORDER — AMLODIPINE BESYLATE 10 MG PO TABS
10.0000 mg | ORAL_TABLET | Freq: Every day | ORAL | Status: DC
  Administered 2018-06-23 – 2018-07-02 (×10): 10 mg
  Filled 2018-06-23 (×10): qty 1

## 2018-06-23 MED ORDER — COLCHICINE 0.6 MG PO TABS
0.3000 mg | ORAL_TABLET | Freq: Every day | ORAL | Status: DC
  Administered 2018-06-25 – 2018-07-02 (×8): 0.3 mg
  Filled 2018-06-23 (×10): qty 0.5

## 2018-06-23 MED ORDER — ALLOPURINOL 300 MG PO TABS
300.0000 mg | ORAL_TABLET | Freq: Every day | ORAL | Status: DC
  Administered 2018-06-25: 300 mg
  Filled 2018-06-23: qty 1

## 2018-06-23 MED ORDER — CLONIDINE HCL 0.1 MG PO TABS
0.1000 mg | ORAL_TABLET | Freq: Two times a day (BID) | ORAL | Status: DC
  Administered 2018-06-23 – 2018-06-25 (×4): 0.1 mg via ORAL
  Filled 2018-06-23 (×4): qty 1
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
## 2018-06-24 ENCOUNTER — Inpatient Hospital Stay (HOSPITAL_COMMUNITY): Payer: Medicare Other

## 2018-06-24 LAB — BASIC METABOLIC PANEL WITH GFR
Anion gap: 6 (ref 5–15)
BUN: 40 mg/dL — ABNORMAL HIGH (ref 8–23)
CO2: 18 mmol/L — ABNORMAL LOW (ref 22–32)
Calcium: 7.4 mg/dL — ABNORMAL LOW (ref 8.9–10.3)
Chloride: 118 mmol/L — ABNORMAL HIGH (ref 98–111)
Creatinine, Ser: 3.85 mg/dL — ABNORMAL HIGH (ref 0.61–1.24)
GFR calc Af Amer: 17 mL/min — ABNORMAL LOW
GFR calc non Af Amer: 15 mL/min — ABNORMAL LOW
Glucose, Bld: 108 mg/dL — ABNORMAL HIGH (ref 70–99)
Potassium: 4 mmol/L (ref 3.5–5.1)
Sodium: 142 mmol/L (ref 135–145)

## 2018-06-24 LAB — CBC
HCT: 25.4 % — ABNORMAL LOW (ref 39.0–52.0)
Hemoglobin: 7.7 g/dL — ABNORMAL LOW (ref 13.0–17.0)
MCH: 24.1 pg — ABNORMAL LOW (ref 26.0–34.0)
MCHC: 30.3 g/dL (ref 30.0–36.0)
MCV: 79.6 fL — AB (ref 80.0–100.0)
Platelets: 141 10*3/uL — ABNORMAL LOW (ref 150–400)
RBC: 3.19 MIL/uL — ABNORMAL LOW (ref 4.22–5.81)
RDW: 18.1 % — ABNORMAL HIGH (ref 11.5–15.5)
WBC: 8.6 10*3/uL (ref 4.0–10.5)
nRBC: 0 % (ref 0.0–0.2)

## 2018-06-24 LAB — TRIGLYCERIDES: Triglycerides: 288 mg/dL — ABNORMAL HIGH

## 2018-06-24 LAB — VITAMIN B1: Vitamin B1 (Thiamine): 110.1 nmol/L (ref 66.5–200.0)

## 2018-06-24 LAB — GLUCOSE, CAPILLARY
Glucose-Capillary: 112 mg/dL — ABNORMAL HIGH (ref 70–99)
Glucose-Capillary: 98 mg/dL (ref 70–99)
Glucose-Capillary: 101 mg/dL — ABNORMAL HIGH (ref 70–99)
Glucose-Capillary: 99 mg/dL (ref 70–99)
Glucose-Capillary: 98 mg/dL (ref 70–99)

## 2018-06-24 LAB — CK TOTAL AND CKMB (NOT AT ARMC)
CK, MB: 4.2 ng/mL (ref 0.5–5.0)
Relative Index: 1.8 (ref 0.0–2.5)
Total CK: 231 U/L (ref 49–397)

## 2018-06-24 MED ORDER — CHLORHEXIDINE GLUCONATE 0.12 % MT SOLN
15.0000 mL | Freq: Two times a day (BID) | OROMUCOSAL | Status: DC
  Administered 2018-06-24 – 2018-07-05 (×21): 15 mL via OROMUCOSAL
  Filled 2018-06-24 (×19): qty 15

## 2018-06-24 MED ORDER — DEXMEDETOMIDINE HCL IN NACL 400 MCG/100ML IV SOLN
0.4000 ug/kg/h | INTRAVENOUS | Status: DC
  Administered 2018-06-24: 0.8 ug/kg/h via INTRAVENOUS
  Administered 2018-06-24: 0.6 ug/kg/h via INTRAVENOUS
  Filled 2018-06-24 (×3): qty 100

## 2018-06-24 MED ORDER — ORAL CARE MOUTH RINSE
15.0000 mL | Freq: Two times a day (BID) | OROMUCOSAL | Status: DC
  Administered 2018-06-27 – 2018-07-05 (×14): 15 mL via OROMUCOSAL

## 2018-06-24 MED ORDER — ATORVASTATIN CALCIUM 10 MG PO TABS
20.0000 mg | ORAL_TABLET | Freq: Every day | ORAL | Status: DC
  Administered 2018-06-24: 20 mg via ORAL
  Filled 2018-06-24: qty 2

## 2018-06-24 NOTE — Procedures (Signed)
Extubation Procedure Note  Patient Details:   Name: [name] DOB: 03/30/1946 MRN: [id number]   Airway Documentation:    Vent end date: 06/24/18 Vent end time: 1140   Evaluation  O2 sats: stable throughout Complications: No apparent complications Patient did tolerate procedure well. Bilateral Breath Sounds: Clear, Diminished    Pt extubated per MD order.  PT is on 6L [place] with sats of 96%.  RT will continue to monitor. Yes  Pierre Bali 06/24/2018, 11:47 AM
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
## 2018-06-24 NOTE — Progress Notes (Signed)
Eyeballed patient Orders given to extubated     SIGNATURE    Dr. Brand Males, M.D., F.C.C.P,  Pulmonary and Critical Care Medicine Staff Physician, [place] Director - Interstitial Lung Disease  Program  Pulmonary [place] at [place], [place], [address]  Pager: [phone], If no answer or between  15:00h - 7:00h: call [phone] Telephone: [phone]  11:40 AM 06/24/2018

## 2018-06-24 NOTE — Progress Notes (Signed)
OT Cancellation Note  Patient Details Name: Sourish Allender MRN: [id number] DOB: 1945/11/16   Cancelled Treatment:    Reason Eval/Treat Not Completed: Pt weaning on vent with plan to extubate.  Will hold at this time    Lucille Passy, OTR/L [place] Pager [phone] Office [place], [place] 06/24/2018, 9:03 AM

## 2018-06-24 NOTE — Progress Notes (Signed)
OT Cancellation Note  Patient Details Name: [name] MRN: [id number] DOB: 23-Aug-1945   Cancelled Treatment:    Reason Eval/Treat Not Completed: Medical issues which prohibited therapy(Successful extubation. Sedated, agitated, and highly confused. Rn request hold. Will return as pt medically ready. Thank you.)  [place], OTR/L Acute Rehab Pager: [phone] Office: [phone] 06/24/2018, 2:27 PM

## 2018-06-24 NOTE — Progress Notes (Signed)
   RT reported that she is now doing SBT on patient and patient might be able to do well  Plan Start precerdex and wean diprivan gtt to off t Do SBT If both, ok then extubae At all times sbp < 140  D/w RN  Above is change in plan from note     SIGNATURE    Dr. Brand Males, M.D., F.C.C.P,  Pulmonary and Critical Care Medicine Staff Physician, [place] Director - Interstitial Lung Disease  Program  Pulmonary [place] at [place], [place], [address]  Pager: [phone], If no answer or between  15:00h - 7:00h: call [phone] Telephone: [phone]  8:52 AM 06/24/2018

## 2018-06-25 ENCOUNTER — Inpatient Hospital Stay (HOSPITAL_COMMUNITY): Payer: Medicare Other

## 2018-06-25 ENCOUNTER — Other Ambulatory Visit: Payer: Self-pay

## 2018-06-25 DIAGNOSIS — G40901 Epilepsy, unspecified, not intractable, with status epilepticus: Secondary | ICD-10-CM

## 2018-06-25 LAB — CBC WITH DIFFERENTIAL/PLATELET
ABS IMMATURE GRANULOCYTES: 0.09 10*3/uL — AB (ref 0.00–0.07)
Basophils Absolute: 0 10*3/uL (ref 0.0–0.1)
Basophils Relative: 0 %
EOS PCT: 6 %
Eosinophils Absolute: 0.5 10*3/uL (ref 0.0–0.5)
HCT: 23.9 % — ABNORMAL LOW (ref 39.0–52.0)
Hemoglobin: 7.6 g/dL — ABNORMAL LOW (ref 13.0–17.0)
Immature Granulocytes: 1 %
Lymphocytes Relative: 11 %
Lymphs Abs: 1 10*3/uL (ref 0.7–4.0)
MCH: 25 pg — ABNORMAL LOW (ref 26.0–34.0)
MCHC: 31.8 g/dL (ref 30.0–36.0)
MCV: 78.6 fL — ABNORMAL LOW (ref 80.0–100.0)
MONO ABS: 0.9 10*3/uL (ref 0.1–1.0)
Monocytes Relative: 11 %
NEUTROS ABS: 6.2 10*3/uL (ref 1.7–7.7)
Neutrophils Relative %: 71 %
Platelets: 166 10*3/uL (ref 150–400)
RBC: 3.04 MIL/uL — ABNORMAL LOW (ref 4.22–5.81)
RDW: 18.4 % — ABNORMAL HIGH (ref 11.5–15.5)
WBC: 8.8 10*3/uL (ref 4.0–10.5)
nRBC: 0.2 % (ref 0.0–0.2)

## 2018-06-25 LAB — GLUCOSE, CAPILLARY
Glucose-Capillary: 100 mg/dL — ABNORMAL HIGH (ref 70–99)
Glucose-Capillary: 82 mg/dL (ref 70–99)
Glucose-Capillary: 85 mg/dL (ref 70–99)
Glucose-Capillary: 89 mg/dL (ref 70–99)
Glucose-Capillary: 90 mg/dL (ref 70–99)
Glucose-Capillary: 94 mg/dL (ref 70–99)

## 2018-06-25 LAB — BASIC METABOLIC PANEL
Anion gap: 8 (ref 5–15)
BUN: 38 mg/dL — ABNORMAL HIGH (ref 8–23)
CO2: 15 mmol/L — ABNORMAL LOW (ref 22–32)
Calcium: 7.6 mg/dL — ABNORMAL LOW (ref 8.9–10.3)
Chloride: 124 mmol/L — ABNORMAL HIGH (ref 98–111)
Creatinine, Ser: 3.5 mg/dL — ABNORMAL HIGH (ref 0.61–1.24)
GFR calc Af Amer: 19 mL/min — ABNORMAL LOW (ref >60)
GFR calc non Af Amer: 16 mL/min — ABNORMAL LOW (ref >60)
Glucose, Bld: 101 mg/dL — ABNORMAL HIGH (ref 70–99)
POTASSIUM: 3.6 mmol/L (ref 3.5–5.1)
Sodium: 147 mmol/L — ABNORMAL HIGH (ref 135–145)

## 2018-06-25 LAB — MAGNESIUM: MAGNESIUM: 2.2 mg/dL (ref 1.7–2.4)

## 2018-06-25 LAB — HEPATIC FUNCTION PANEL
ALT: 19 U/L (ref 0–44)
AST: 22 U/L (ref 15–41)
Albumin: 1.8 g/dL — ABNORMAL LOW (ref 3.5–5.0)
Alkaline Phosphatase: 95 U/L (ref 38–126)
Bilirubin, Direct: 0.1 mg/dL (ref 0.0–0.2)
Indirect Bilirubin: 0.5 mg/dL (ref 0.3–0.9)
Total Bilirubin: 0.6 mg/dL (ref 0.3–1.2)
Total Protein: 4.8 g/dL — ABNORMAL LOW (ref 6.5–8.1)

## 2018-06-25 LAB — PHOSPHORUS: Phosphorus: 4.3 mg/dL (ref 2.5–4.6)

## 2018-06-25 LAB — LACTIC ACID, PLASMA: Lactic Acid, Venous: 0.8 mmol/L (ref 0.5–1.9)

## 2018-06-25 MED ORDER — FUROSEMIDE 10 MG/ML IJ SOLN
40.0000 mg | Freq: Once | INTRAMUSCULAR | Status: AC
  Administered 2018-06-25: 40 mg via INTRAVENOUS
  Filled 2018-06-25: qty 4

## 2018-06-25 MED ORDER — VITAL HIGH PROTEIN PO LIQD
1000.0000 mL | ORAL | Status: DC
  Administered 2018-06-25 – 2018-06-27 (×2): 1000 mL
  Filled 2018-06-25 (×2): qty 1000

## 2018-06-25 MED ORDER — DEXMEDETOMIDINE HCL IN NACL 400 MCG/100ML IV SOLN
0.0000 ug/kg/h | INTRAVENOUS | Status: DC
  Administered 2018-06-25: 1 ug/kg/h via INTRAVENOUS
  Administered 2018-06-25: 0.5 ug/kg/h via INTRAVENOUS
  Administered 2018-06-26: 1 ug/kg/h via INTRAVENOUS
  Filled 2018-06-25 (×3): qty 100

## 2018-06-25 MED ORDER — OXYCODONE-ACETAMINOPHEN 5-325 MG PO TABS
1.0000 | ORAL_TABLET | Freq: Four times a day (QID) | ORAL | Status: DC | PRN
  Administered 2018-06-25 – 2018-07-04 (×11): 1 via ORAL
  Filled 2018-06-25 (×11): qty 1

## 2018-06-25 MED ORDER — POTASSIUM CHLORIDE 20 MEQ/15ML (10%) PO SOLN
40.0000 meq | Freq: Once | ORAL | Status: AC
  Administered 2018-06-25: 40 meq
  Filled 2018-06-25: qty 30

## 2018-06-25 MED ORDER — HYDRALAZINE HCL 50 MG PO TABS
100.0000 mg | ORAL_TABLET | Freq: Three times a day (TID) | ORAL | Status: DC
  Administered 2018-06-25 – 2018-07-02 (×24): 100 mg
  Filled 2018-06-25 (×24): qty 2

## 2018-06-25 MED ORDER — ASPIRIN 325 MG PO TABS
325.0000 mg | ORAL_TABLET | Freq: Every day | ORAL | Status: DC
  Administered 2018-06-26: 325 mg
  Filled 2018-06-25: qty 1

## 2018-06-25 MED ORDER — ALBUTEROL SULFATE (2.5 MG/3ML) 0.083% IN NEBU
2.5000 mg | INHALATION_SOLUTION | RESPIRATORY_TRACT | Status: DC | PRN
  Administered 2018-06-25 – 2018-06-30 (×10): 2.5 mg via RESPIRATORY_TRACT
  Filled 2018-06-25 (×10): qty 3

## 2018-06-25 MED ORDER — PRO-STAT SUGAR FREE PO LIQD
30.0000 mL | Freq: Three times a day (TID) | ORAL | Status: DC
  Administered 2018-06-25 – 2018-06-27 (×7): 30 mL
  Filled 2018-06-25 (×6): qty 30

## 2018-06-25 MED ORDER — LEVETIRACETAM 100 MG/ML PO SOLN
500.0000 mg | Freq: Two times a day (BID) | ORAL | Status: DC
  Administered 2018-06-25 – 2018-07-02 (×15): 500 mg
  Filled 2018-06-25 (×17): qty 5

## 2018-06-25 MED ORDER — ATORVASTATIN CALCIUM 10 MG PO TABS
20.0000 mg | ORAL_TABLET | Freq: Every day | ORAL | Status: DC
  Administered 2018-06-25 – 2018-07-02 (×8): 20 mg
  Filled 2018-06-25 (×8): qty 2

## 2018-06-25 MED ORDER — FOLIC ACID 1 MG PO TABS
1.0000 mg | ORAL_TABLET | Freq: Every day | ORAL | Status: DC
  Administered 2018-06-26 – 2018-07-02 (×7): 1 mg
  Filled 2018-06-25 (×7): qty 1

## 2018-06-25 MED ORDER — FREE WATER
200.0000 mL | Freq: Three times a day (TID) | Status: DC
  Administered 2018-06-25 – 2018-06-27 (×8): 200 mL

## 2018-06-25 MED ORDER — ALLOPURINOL 100 MG PO TABS
200.0000 mg | ORAL_TABLET | Freq: Every day | ORAL | Status: DC
  Administered 2018-06-26 – 2018-07-02 (×7): 200 mg
  Filled 2018-06-25 (×7): qty 2

## 2018-06-25 MED ORDER — ALBUTEROL SULFATE (2.5 MG/3ML) 0.083% IN NEBU
INHALATION_SOLUTION | RESPIRATORY_TRACT | Status: AC
  Filled 2018-06-25: qty 3

## 2018-06-25 MED ORDER — PANTOPRAZOLE SODIUM 40 MG PO PACK
40.0000 mg | PACK | Freq: Every day | ORAL | Status: DC
  Administered 2018-06-26 – 2018-06-28 (×3): 40 mg
  Filled 2018-06-25 (×3): qty 20

## 2018-06-25 MED ORDER — SENNOSIDES-DOCUSATE SODIUM 8.6-50 MG PO TABS: 1.0000 | ORAL_TABLET | Freq: Every evening | ORAL | Status: DC | PRN

## 2018-06-25 MED ORDER — VITAMIN B-1 100 MG PO TABS
100.0000 mg | ORAL_TABLET | Freq: Every day | ORAL | Status: DC
  Administered 2018-06-26 – 2018-07-02 (×7): 100 mg
  Filled 2018-06-25 (×7): qty 1

## 2018-06-25 MED ORDER — CLONIDINE HCL 0.1 MG PO TABS
0.1000 mg | ORAL_TABLET | Freq: Two times a day (BID) | ORAL | Status: DC
  Administered 2018-06-25: 0.1 mg
  Filled 2018-06-25: qty 1
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
## 2018-06-25 NOTE — Progress Notes (Signed)
PCCM INTERVAL PROGRESS NOTE   Called to bedside to evaluate patient for respiratory distress. Briefly, he is a 73 year old male admitted for seizures likely in the setting of PRES. He is requiring clevidipine infusion and several oral agents and he remains hypertensive. He is on precedex for agitation. RN reports agitation had been well controlled tonight until he developed dyspnea. HTN also worsened at that time.   Warren Lacy MD had ordered albuterol for reported wheezing.   On my exam he is no longer wheezing, but does sound coarse.   Will order CXR. He is 7 L positive and BP is high. Suspect he has some pulmonary edema. He was given lasix 40mg  earlier today, but has not made significant output. With creatinine 3.5 he may need a higher dose. If CXR confirms my suspicion will attempt aggressive diureisis in hopes he can avoid NIMV or re-intubation.   ______________   CXR still pending, but the patient has had albuterol tx and had a large bowel movement. His agitation and dyspnea has since improved and he is comfortably back on his baseline O2 with O2 sats 95%.  Will defer diuresis for now.  Georgann Housekeeper, AGACNP-BC [place] Pager [phone] or [phone]  06/25/2018 10:40 PM
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
## 2018-06-25 NOTE — Progress Notes (Signed)
[facility] Progress Note Patient Name: [name] DOB: 1945/10/25 MRN: [id number]   Date of Service  06/25/2018  HPI/Events of Note  Acute onset of SOB. - Sat - 94% on [place] O2 and RR = 26. Bedside nurse states that he is wheezing.   eICU Interventions  Will order: 1.  Albuterol 2.5 mg via neb Q 3 hours PRN wheezing or SOB. 2. Will ask ground team to evaluate at bedside.      Intervention Category Major Interventions: Other:  Lysle Dingwall 06/25/2018, 10:25 PM
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
## 2018-06-26 DIAGNOSIS — K861 Other chronic pancreatitis: Secondary | ICD-10-CM

## 2018-06-26 LAB — CBC WITH DIFFERENTIAL/PLATELET
Abs Immature Granulocytes: 0.14 10*3/uL — ABNORMAL HIGH (ref 0.00–0.07)
BASOS ABS: 0 10*3/uL (ref 0.0–0.1)
Basophils Relative: 0 %
EOS PCT: 4 %
Eosinophils Absolute: 0.4 10*3/uL (ref 0.0–0.5)
HCT: 22.3 % — ABNORMAL LOW (ref 39.0–52.0)
Hemoglobin: 7.1 g/dL — ABNORMAL LOW (ref 13.0–17.0)
Immature Granulocytes: 2 %
Lymphocytes Relative: 9 %
Lymphs Abs: 0.8 10*3/uL (ref 0.7–4.0)
MCH: 24.7 pg — ABNORMAL LOW (ref 26.0–34.0)
MCHC: 31.8 g/dL (ref 30.0–36.0)
MCV: 77.4 fL — ABNORMAL LOW (ref 80.0–100.0)
Monocytes Absolute: 0.8 10*3/uL (ref 0.1–1.0)
Monocytes Relative: 9 %
NRBC: 0.2 % (ref 0.0–0.2)
Neutro Abs: 6.7 10*3/uL (ref 1.7–7.7)
Neutrophils Relative %: 76 %
Platelets: 195 10*3/uL (ref 150–400)
RBC: 2.88 MIL/uL — ABNORMAL LOW (ref 4.22–5.81)
RDW: 18 % — ABNORMAL HIGH (ref 11.5–15.5)
WBC: 8.9 10*3/uL (ref 4.0–10.5)

## 2018-06-26 LAB — GLUCOSE, CAPILLARY
Glucose-Capillary: 108 mg/dL — ABNORMAL HIGH (ref 70–99)
Glucose-Capillary: 109 mg/dL — ABNORMAL HIGH (ref 70–99)
Glucose-Capillary: 110 mg/dL — ABNORMAL HIGH (ref 70–99)
Glucose-Capillary: 111 mg/dL — ABNORMAL HIGH (ref 70–99)
Glucose-Capillary: 95 mg/dL (ref 70–99)
Glucose-Capillary: 98 mg/dL (ref 70–99)

## 2018-06-26 LAB — CULTURE, BLOOD (ROUTINE X 2)
Culture: NO GROWTH
Culture: NO GROWTH
Special Requests: ADEQUATE
Special Requests: ADEQUATE

## 2018-06-26 LAB — BASIC METABOLIC PANEL
Anion gap: 10 (ref 5–15)
BUN: 47 mg/dL — ABNORMAL HIGH (ref 8–23)
CO2: 14 mmol/L — ABNORMAL LOW (ref 22–32)
Calcium: 8 mg/dL — ABNORMAL LOW (ref 8.9–10.3)
Chloride: 123 mmol/L — ABNORMAL HIGH (ref 98–111)
Creatinine, Ser: 3.52 mg/dL — ABNORMAL HIGH (ref 0.61–1.24)
GFR calc Af Amer: 19 mL/min — ABNORMAL LOW (ref >60)
GFR, EST NON AFRICAN AMERICAN: 16 mL/min — AB (ref >60)
GLUCOSE: 121 mg/dL — AB (ref 70–99)
Potassium: 3.8 mmol/L (ref 3.5–5.1)
Sodium: 147 mmol/L — ABNORMAL HIGH (ref 135–145)

## 2018-06-26 LAB — PHOSPHORUS: Phosphorus: 4.9 mg/dL — ABNORMAL HIGH (ref 2.5–4.6)

## 2018-06-26 LAB — MAGNESIUM: Magnesium: 2 mg/dL (ref 1.7–2.4)

## 2018-06-26 MED ORDER — LOPERAMIDE HCL 1 MG/7.5ML PO SUSP
2.0000 mg | ORAL | Status: DC | PRN
  Administered 2018-06-26 – 2018-06-27 (×3): 2 mg
  Filled 2018-06-26 (×4): qty 15

## 2018-06-26 MED ORDER — METOPROLOL TARTRATE 50 MG PO TABS
50.0000 mg | ORAL_TABLET | Freq: Two times a day (BID) | ORAL | Status: DC
  Administered 2018-06-26 – 2018-07-01 (×12): 50 mg via ORAL
  Filled 2018-06-26 (×12): qty 1

## 2018-06-26 MED ORDER — ALPRAZOLAM 0.5 MG PO TABS
0.5000 mg | ORAL_TABLET | Freq: Once | ORAL | Status: AC
  Administered 2018-06-26: 0.5 mg
  Filled 2018-06-26: qty 1

## 2018-06-26 MED ORDER — CLONIDINE HCL 0.2 MG PO TABS
0.2000 mg | ORAL_TABLET | Freq: Three times a day (TID) | ORAL | Status: DC
  Administered 2018-06-26 – 2018-07-01 (×16): 0.2 mg
  Filled 2018-06-26: qty 2
  Filled 2018-06-26: qty 1
  Filled 2018-06-26 (×3): qty 2
  Filled 2018-06-26 (×4): qty 1
  Filled 2018-06-26 (×2): qty 2
  Filled 2018-06-26: qty 1
  Filled 2018-06-26: qty 2
  Filled 2018-06-26 (×2): qty 1
  Filled 2018-06-26: qty 2

## 2018-06-26 NOTE — Progress Notes (Signed)
eLink Physician-Brief Progress Note Patient Name: [name] DOB: 1945/10/18 MRN: [id number]   Date of Service  06/26/2018  HPI/Events of Note  PCCM Progress note from yesterday states that Foley catheter will be removed. No order to remove Foley catheter written.   eICU Interventions  Will D/C Foley Catheter.      Intervention Category Major Interventions: Other:  Sommer,Steven [name] Copa 06/26/2018, 4:28 AM

## 2018-06-26 NOTE — Progress Notes (Signed)
SLP Cancellation Note  Patient Details Name: Sigfredo Schreier MRN: [id number] DOB: 1945-10-25   Cancelled treatment:       Reason Eval/Treat Not Completed: Fatigue/lethargy limiting ability to participate. RN requested that patient be seen later this pm as patient currently sedated however sedation will be lifted.   Gabriel Rainwater MA, CCC-SLP     Holden Maniscalco Meryl 06/26/2018, 9:38 AM

## 2018-06-26 NOTE — Progress Notes (Signed)
eLink Physician-Brief Progress Note Patient Name: [name] DOB: [birth year] MRN: [id number]   Date of Service  06/26/2018  HPI/Events of Note  Agitation  eICU Interventions  Will order: 1. Xanax 0.5 mg PO X 1 now.      Intervention Category Minor Interventions: Agitation / anxiety - evaluation and management  Lysle Dingwall 06/26/2018, 8:19 PM
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
## 2018-06-26 NOTE — Progress Notes (Signed)
Speech Language Pathology Treatment: Dysphagia  Patient Details Name: [name] MRN: [id number] DOB: [birth year] Today's Date: 06/26/2018 Time: 1436-1500 SLP Time Calculation (min) (ACUTE ONLY): 24 min  Assessment / Plan / Recommendation Clinical Impression  Pt was seen this afternoon for f/u after BSE on previous date, recommending NPO except for ice chips. Clear liquid diet was initiated earlier today, and RN reports that pt had just finished his first lunch tray. Upon arrival to room, pt was restless in the bed, SOB, and calling out into the hallway that he could not breathe. He was repositioned into a more upright posture with congested cough noted. Min cues were given for additional volitional coughs, and Mod cues were given to breathe through his nose instead of his mouth (pt has nasal cannula). Pt was observed to take one sip of water without overt difficulty but he continued to say that he was SOB, also saying that he couldn't drink any more because he could not breathe. SLP removed additional POs. RN was called to the room to assess him as well. RR reached up to 30 and SpO2 down to 90%. Pt's respiratory status appeared calmer and closer to baseline (per RN) prior to SLP departure. Limited assessment makes it difficult to recommend a safe diet, but recommendation from SLP on previous date was for NPO except for ice chips and change in respiratory status occurred immediately following lunch meal. Therefore, would continue to hold POs pending completion of MBS - will attempt on next date.   HPI HPI: [name] is a 73 y.o. male who presented 2/13 with left sided weakness, headache, confusion.  Initial CT was negative; however, later had seizure like episode with right sided gaze that did not break with ativan or keppra; therefore, he was intubated.  PCCM called for ICU admission.  Concern for status epilepticus.  Controlled.  Later had fall, CT head negative after.  Gaze change led to MRI which  showed PRES.        SLP Plan  MBS       Recommendations  Diet recommendations: NPO Medication Administration: Via alternative means                Oral Care Recommendations: Oral care QID;Oral care prior to ice chip/H20 Follow up Recommendations: (tba) SLP Visit Diagnosis: Dysphagia, oropharyngeal phase (R13.12) Plan: MBS       GO                Virl Axe [name] 06/26/2018, 3:13 PM  Natalia Leatherwood, M.A. CCC-SLP Acute Herbalist [phone] Office [phone]

## 2018-06-26 NOTE — Progress Notes (Signed)
Paged critical care doctor on call at 1900 for BP of 178/108. Patient also has had 5 bowel movements since being on floor between 1400 and 1800.

## 2018-06-27 ENCOUNTER — Inpatient Hospital Stay (HOSPITAL_COMMUNITY): Payer: Medicare Other

## 2018-06-27 DIAGNOSIS — C9210 Chronic myeloid leukemia, BCR/ABL-positive, not having achieved remission: Secondary | ICD-10-CM

## 2018-06-27 DIAGNOSIS — N184 Chronic kidney disease, stage 4 (severe): Secondary | ICD-10-CM

## 2018-06-27 DIAGNOSIS — K860 Alcohol-induced chronic pancreatitis: Secondary | ICD-10-CM

## 2018-06-27 DIAGNOSIS — E440 Moderate protein-calorie malnutrition: Secondary | ICD-10-CM

## 2018-06-27 LAB — CBC WITH DIFFERENTIAL/PLATELET
Abs Immature Granulocytes: 0.23 10*3/uL — ABNORMAL HIGH (ref 0.00–0.07)
BASOS PCT: 1 %
Basophils Absolute: 0.1 10*3/uL (ref 0.0–0.1)
Eosinophils Absolute: 0.1 10*3/uL (ref 0.0–0.5)
Eosinophils Relative: 1 %
HCT: 23.4 % — ABNORMAL LOW (ref 39.0–52.0)
Hemoglobin: 7.2 g/dL — ABNORMAL LOW (ref 13.0–17.0)
Immature Granulocytes: 2 %
Lymphocytes Relative: 8 %
Lymphs Abs: 0.8 10*3/uL (ref 0.7–4.0)
MCH: 24.1 pg — ABNORMAL LOW (ref 26.0–34.0)
MCHC: 30.8 g/dL (ref 30.0–36.0)
MCV: 78.3 fL — ABNORMAL LOW (ref 80.0–100.0)
Monocytes Absolute: 0.8 10*3/uL (ref 0.1–1.0)
Monocytes Relative: 8 %
NEUTROS PCT: 80 %
Neutro Abs: 8.9 10*3/uL — ABNORMAL HIGH (ref 1.7–7.7)
PLATELETS: 242 10*3/uL (ref 150–400)
RBC: 2.99 MIL/uL — ABNORMAL LOW (ref 4.22–5.81)
RDW: 18.4 % — ABNORMAL HIGH (ref 11.5–15.5)
WBC: 11 10*3/uL — ABNORMAL HIGH (ref 4.0–10.5)
nRBC: 0 % (ref 0.0–0.2)

## 2018-06-27 LAB — GLUCOSE, CAPILLARY
Glucose-Capillary: 105 mg/dL — ABNORMAL HIGH (ref 70–99)
Glucose-Capillary: 109 mg/dL — ABNORMAL HIGH (ref 70–99)
Glucose-Capillary: 123 mg/dL — ABNORMAL HIGH (ref 70–99)
Glucose-Capillary: 123 mg/dL — ABNORMAL HIGH (ref 70–99)
Glucose-Capillary: 95 mg/dL (ref 70–99)

## 2018-06-27 LAB — BASIC METABOLIC PANEL
Anion gap: 12 (ref 5–15)
BUN: 54 mg/dL — ABNORMAL HIGH (ref 8–23)
CO2: 12 mmol/L — ABNORMAL LOW (ref 22–32)
Calcium: 8.3 mg/dL — ABNORMAL LOW (ref 8.9–10.3)
Chloride: 125 mmol/L — ABNORMAL HIGH (ref 98–111)
Creatinine, Ser: 3.24 mg/dL — ABNORMAL HIGH (ref 0.61–1.24)
GFR calc Af Amer: 21 mL/min — ABNORMAL LOW (ref >60)
GFR, EST NON AFRICAN AMERICAN: 18 mL/min — AB (ref >60)
Glucose, Bld: 95 mg/dL (ref 70–99)
Potassium: 4.5 mmol/L (ref 3.5–5.1)
Sodium: 149 mmol/L — ABNORMAL HIGH (ref 135–145)

## 2018-06-27 LAB — TRIGLYCERIDES: Triglycerides: 139 mg/dL (ref <150)

## 2018-06-27 LAB — PHOSPHORUS: PHOSPHORUS: 4.8 mg/dL — AB (ref 2.5–4.6)

## 2018-06-27 LAB — MAGNESIUM: Magnesium: 2.1 mg/dL (ref 1.7–2.4)

## 2018-06-27 MED ORDER — RESOURCE THICKENUP CLEAR PO POWD
ORAL | Status: DC | PRN
  Filled 2018-06-27: qty 125

## 2018-06-27 MED ORDER — ENSURE ENLIVE PO LIQD
237.0000 mL | Freq: Every day | ORAL | Status: DC
  Administered 2018-06-27 – 2018-07-04 (×7): 237 mL via ORAL

## 2018-06-27 MED ORDER — ALBUTEROL SULFATE (2.5 MG/3ML) 0.083% IN NEBU
2.5000 mg | INHALATION_SOLUTION | Freq: Four times a day (QID) | RESPIRATORY_TRACT | Status: DC
  Administered 2018-06-27 – 2018-06-28 (×5): 2.5 mg via RESPIRATORY_TRACT
  Filled 2018-06-27 (×5): qty 3

## 2018-06-27 MED ORDER — VITAL HIGH PROTEIN PO LIQD: 1000.0000 mL | ORAL | Status: DC

## 2018-06-27 MED ORDER — VITAL HIGH PROTEIN PO LIQD
1000.0000 mL | ORAL | Status: DC
  Administered 2018-06-27 – 2018-06-28 (×2): 1000 mL
  Filled 2018-06-27 (×5): qty 1000
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
## 2018-06-27 NOTE — Progress Notes (Signed)
Modified Barium Swallow Progress Note  Patient Details  Name: [name] MRN: [id number] Date of Birth: 05-01-1946  Today's Date: 06/27/2018  Modified Barium Swallow completed.  Full report located under Chart Review in the Imaging Section.  Brief recommendations include the following:  Clinical Impression  Pt has a moderate oral dysphagia with mild pharyngeal dysphagia that may actually be near baseline function, but his mentation also impacts his risk for aspiration. He has oral holding that is most prolonged with purees, despite cues from SLP to initiate posterior transit. A liquid wash is most effective at clearing his oral cavity. Pt initiates a swallow with thin liquids in the pyriform sinuses, which results in penetration before the swallow. Even when it reaches the true vocal folds he does not make attempts at clearance, and he cognitively does not follow commands consistently. When he does produce a volitional cough, it is not strong enough to clear his laryngeal vestibule. He fully protects his airway with purees and nectar thick liquids, and there is no significant pharyngeal residue. Given his respiratory status and mentation, recommend starting a full liquid diet, thickened to nectar thick liquids. As his mentation clears, suspect that he will progress well to more solid textures and thinner liquids.    Swallow Evaluation Recommendations       SLP Diet Recommendations: Nectar thick liquid;Other (Comment)(full liquid diet)   Liquid Administration via: Straw   Medication Administration: Crushed with puree   Supervision: Staff to assist with self feeding;Full supervision/cueing for compensatory strategies   Compensations: Minimize environmental distractions;Slow rate;Small sips/bites;Follow solids with liquid   Postural Changes: Seated upright at 90 degrees   Oral Care Recommendations: Oral care BID   Other Recommendations: Order thickener from pharmacy;Prohibited food  (jello, ice cream, thin soups);Remove water pitcher;Have oral suction available    Venita Sheffield [name] 06/27/2018,2:11 PM   Nuala Alpha, M.A. [place] Acute Environmental education officer [phone] Office [phone]

## 2018-06-27 NOTE — NC FL2 (Signed)
[place] LEVEL OF CARE SCREENING TOOL     IDENTIFICATION  Patient Name: [name] Birthdate: [birth year] Sex: male Admission Date (Current Location): 06/20/2018  [facility] and [place] Number:  Herbalist and Address:  [facility], [place] [address], [place], [place] [address]      Provider Number: [id number]  Attending Physician Name and Address:  Florencia Reasons, MD  Relative Name and Phone Number:       Current Level of Care: Hospital Recommended Level of Care: [place] Prior Approval Number:    Date Approved/Denied:   PASRR Number: [id number]  Discharge Plan: SNF    Current Diagnoses: Patient Active Problem List   Diagnosis Date Noted  . Malnutrition of moderate degree 06/22/2018  . Seizure ([place]) 06/21/2018  . Status epilepticus ([place]) 06/21/2018  . Stroke ([place]) 06/20/2018  . CKD (chronic kidney disease), stage III ([place]) 06/20/2018  . Olecranon bursitis, right elbow 11/16/2016  . Idiopathic chronic gout of multiple sites without tophus 11/16/2016  . Hypernatremia 09/30/2016  . Dehydration 09/30/2016  . Acute gout 09/30/2016  . Severe depression ([place]) 09/30/2016  . Metabolic encephalopathy 36/62/9476  . Hypokalemia 09/30/2016  . Chronic pancreatitis ([place]) 09/30/2016  . Exhausted vascular access 09/30/2016  . Leukocytosis 09/30/2016  . Enterococcus UTI 09/30/2016  . Acute respiratory failure with hypoxia ([place])   . Cellulitis of upper extremity   . Respiratory distress   . Chest pain 09/10/2016  . Muscle cramps 09/10/2016  . Gout attack 04/30/2016  . Unintentional weight loss 04/29/2016  . Pancreatitis 03/14/2016  . Acute kidney injury ([place]) 03/14/2016  . Abdominal pain 03/14/2016  . Hypokalemia 03/14/2016  . Diarrhea, unspecified 03/14/2016  . Dehydration 01/22/2016  . Intractable nausea and vomiting 01/22/2016  . Alcohol intoxication ([place]) 04/03/2015  . Alcohol use, unspecified with  alcohol-induced mood disorder ([place]) 04/03/2015  . Poor dentition 02/16/2015  . Essential hypertension 02/15/2015  . Tobacco abuse 02/15/2015  . Cellulitis of submandibular region 02/15/2015  . 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 Painful orthopaedic hardware ([place]) 07/03/2012  . 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[place]) 01/27/2012  . Ankylosis of left knee 06/10/2011    Orientation RESPIRATION BLADDER Height & Weight     Self, Place  Normal, O2(see d/c summary for oxygen requirements) Incontinent, External catheter Weight: 62.4 kg Height:  5\' 9"  (175.3 cm)  BEHAVIORAL SYMPTOMS/MOOD NEUROLOGICAL BOWEL NUTRITION STATUS    Convulsions/Seizures Incontinent Diet(full liquid nectar thick--hope to advance soon)  AMBULATORY STATUS COMMUNICATION OF NEEDS Skin   Extensive Assist Verbally Normal                       Personal Care Assistance Level of Assistance  Bathing, Feeding, Dressing Bathing Assistance: Maximum assistance Feeding assistance: Maximum assistance Dressing Assistance: Maximum assistance     Functional Limitations Info  Sight, Hearing, Speech Sight Info: Adequate Hearing Info: Adequate Speech Info: Adequate    SPECIAL CARE FACTORS FREQUENCY  PT (By licensed PT), OT (By licensed OT), Speech therapy     PT Frequency: 5x/ wk OT Frequency: 5x/wk     Speech Therapy Frequency: 5x/wk      Contractures Contractures Info: Not present    Additional Factors Info  Code Status, Allergies Code Status Info: full  Allergies Info: Lohexol           Current Medications (06/27/2018):  This is the current hospital active medication list Current Facility-Administered Medications  Medication Dose Route Frequency Provider  Last Rate Last Dose  . 0.9 %  sodium chloride infusion   Intravenous Continuous Brand Males, MD 10 mL/hr at 06/26/18 1300    . acetaminophen (TYLENOL) tablet 650 mg  650 mg Oral Q4H PRN Regalado, Belkys A, MD       Or  . acetaminophen (TYLENOL)  solution 650 mg  650 mg Per Tube Q4H PRN Regalado, Belkys A, MD   650 mg at 06/27/18 1502   Or  . acetaminophen (TYLENOL) suppository 650 mg  650 mg Rectal Q4H PRN Regalado, Belkys A, MD   650 mg at 06/21/18 1426  . albuterol (PROVENTIL) (2.5 MG/3ML) 0.083% nebulizer solution 2.5 mg  2.5 mg Nebulization Q3H PRN Anders Simmonds, MD   2.5 mg at 06/27/18 0307  . albuterol (PROVENTIL) (2.5 MG/3ML) 0.083% nebulizer solution 2.5 mg  2.5 mg Nebulization Q6H WA Anders Simmonds, MD   2.5 mg at 06/27/18 1354  . allopurinol (ZYLOPRIM) tablet 200 mg  200 mg Per Tube Daily Jennelle Human B, NP   200 mg at 06/27/18 1151  . amLODipine (NORVASC) tablet 10 mg  10 mg Per Tube Daily Ollis, Brandi L, NP   10 mg at 06/27/18 1153  . atorvastatin (LIPITOR) tablet 20 mg  20 mg Per Tube [id number] Brand Males, MD   20 mg at 06/26/18 1717  . chlorhexidine (PERIDEX) 0.12 % solution 15 mL  15 mL Mouth Rinse BID Brand Males, MD   15 mL at 06/27/18 1154  . clevidipine (CLEVIPREX) infusion 0.5 mg/mL  0-21 mg/hr Intravenous Continuous Juanito Doom, MD   Stopped at 06/26/18 1118  . cloNIDine (CATAPRES) tablet 0.2 mg  0.2 mg Per Tube TID Simonne Maffucci B, MD   0.2 mg at 06/27/18 1151  . colchicine tablet 0.3 mg  0.3 mg Per Tube Daily Ollis, Brandi L, NP   0.3 mg at 06/27/18 1153  . dexmedetomidine (PRECEDEX) 400 MCG/100ML (4 mcg/mL) infusion  0-1.2 mcg/kg/hr Intravenous Titrated Rigoberto Noel, MD   Stopped at 06/26/18 [phone]  . feeding supplement (ENSURE ENLIVE) (ENSURE ENLIVE) liquid 237 mL  237 mL Oral Q1500 Florencia Reasons, MD      . feeding supplement (VITAL HIGH PROTEIN) liquid 1,000 mL  1,000 mL Per Tube Continuous Florencia Reasons, MD      . folic acid (FOLVITE) tablet 1 mg  1 mg Per Tube Daily Jennelle Human B, NP   1 mg at 06/27/18 1152  . free water 200 mL  200 mL Per Tube Q8H Jennelle Human B, NP   200 mL at 06/27/18 1637  . heparin injection 5,000 Units  5,000 Units Subcutaneous Q8H Regalado, Belkys A, MD   5,000 Units  at 06/27/18 1507  . hydrALAZINE (APRESOLINE) tablet 100 mg  100 mg Per Tube TID Jennelle Human B, NP   100 mg at 06/27/18 1151  . levETIRAcetam (KEPPRA) 100 MG/ML solution 500 mg  500 mg Per Tube BID Jennelle Human B, NP   500 mg at 06/27/18 1155  . loperamide HCl (IMODIUM) 1 MG/7.5ML suspension 2 mg  2 mg Per Tube PRN Juanito Doom, MD   2 mg at 06/26/18 2054  . MEDLINE mouth rinse  15 mL Mouth Rinse q12n4p Brand Males, MD   15 mL at 06/27/18 1156  . metoprolol tartrate (LOPRESSOR) tablet 50 mg  50 mg Oral BID Simonne Maffucci B, MD   50 mg at 06/27/18 1156  . oxyCODONE-acetaminophen (PERCOCET/ROXICET) 5-325 MG per tablet 1 tablet  1 tablet  Oral Q6H PRN Rigoberto Noel, MD   1 tablet at 06/27/18 1152  . pantoprazole sodium (PROTONIX) 40 mg/20 mL oral suspension 40 mg  40 mg Per Tube Daily Brand Males, MD   40 mg at 06/27/18 1156  . senna-docusate (Senokot-S) tablet 1 tablet  1 tablet Per Tube QHS PRN Brand Males, MD      . thiamine (VITAMIN B-1) tablet 100 mg  100 mg Per Tube Daily Jennelle Human B, NP   100 mg at 06/27/18 1152     Discharge Medications: Please see discharge summary for a list of discharge medications.  Relevant Imaging Results:  Relevant Lab Results:   Additional Information SSN: [id number]  Pollie Friar, RN
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
## 2018-06-27 NOTE — Progress Notes (Signed)
Pt. very restless/anxious; some SOB/wheezing noted and called resp. therapy for tx.; Dr. Oletta Darter paged re: anxiousness and order given.
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
## 2018-06-27 NOTE — Care Management Note (Signed)
CM spoke to patient at the bedside and he states [name] is his girlfriend. CM inquired about his next of kin and he states its his daughter. [name] is his daughter listed. CM reached out to her and she is in agreement with having patient faxed out for SNF rehab. She was interested in a facility in the [facility] area. [name] states she will discuss plans with her other siblings and [name].  [name] would like to speak to MD tomorrow but is not available until after 5 pm. CM will update MD in the am.

## 2018-06-27 NOTE — Care Management Important Message (Signed)
Important Message  Patient Details  Name: [name] MRN: [id number] Date of Birth: 09/05/1945   Medicare Important Message Given:  Yes    Orbie Pyo 06/27/2018, 12:09 PM

## 2018-06-27 NOTE — Plan of Care (Signed)
  Problem: Education: Goal: Knowledge of General Education information will improve Description Including pain rating scale, medication(s)/side effects and non-pharmacologic comfort measures Outcome: Progressing Note:  POC reviewed with pt.   

## 2018-06-27 NOTE — Progress Notes (Signed)
PROGRESS NOTE  [name] JGG:[id number] DOB: 1946-01-06 DOA: 06/20/2018 PCP: [name], MD  Brief summary:  73 y/o male admitted with seizure in setting of PRES, extubated on 2/16 Transferred to [facility] on 2/19    HPI/Recap of past 24 hours:  Patient is seen after returned from barium swallow study He is still drowsy but oriented x3,  He has intermittent agitation, required prn xanax, last dose on 2/18 evening He has mittens on and ng tube in place  Assessment/Plan: Active Problems:   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[place])   Essential hypertension   Acute respiratory failure with hypoxia ([place])   Chronic pancreatitis ([facility])   Idiopathic chronic gout of multiple sites without tophus   Stroke ([place])   CKD (chronic kidney disease), stage III ([facility])   Seizure ([place])   Status epilepticus ([place])   Malnutrition of moderate degree  Hypertensive emergency/PRES/seizure/deliruim  -he required airway protection, now extubated on 2/16 -he is off  Cleviprex, no norvasc, clonidine, hydralazine , lopressor. Continue adjust bp meds prm  - he is off precedex drip, off propofol drip, currently on keppra  Possible alcohol abuse, h/p chronic pancreatitis > thiamine, folate   Dysphagia: S/p MBS  SLP Diet Recommendations: Nectar thick liquid;Other (Comment)(full liquid diet)   Liquid Administration via: Straw   Medication Administration: Crushed with puree   Supervision: Staff to assist with self feeding;Full supervision/cueing for compensatory strategies   Compensations: Minimize environmental distractions;Slow rate;Small sips/bites;Follow solids with liquid   Postural Changes: Seated upright at 90 degrees   Oral Care Recommendations: Oral care BID   Other Recommendations: Order thickener from pharmacy;Prohibited food (jello, ice cream, thin soups);Remove water pitcher;Have oral suction available  CKDIV Cr at baseline Renal dosing meds  Anemia: hgb 7.2  Chronic  leukemia; Under the care of Dr. [name]. On bosutinib  Gout; On allopurinol  Non-severe (moderate) malnutrition in context of chronic illness Nutrition input appreciated   Code Status: full  Family Communication: patient   Disposition Plan: likely will need snf   Consultants:  Critical care  neurology  Procedures:  cortrak feeding tube placement  EEG  Intubation/extubation   Antibiotics:  none   Objective: BP (!) 175/76 (BP Location: Left Arm)   Pulse 92   Temp 97.9 F (36.6 C) (Axillary)   Resp 18   Ht 5\' 9"  (1.753 m)   Wt 62.4 kg   SpO2 97%   BMI 20.32 kg/m   Intake/Output Summary (Last 24 hours) at 06/27/2018 1326 Last data filed at 06/27/2018 1200 Gross per 24 hour  Intake 951 ml  Output 850 ml  Net 101 ml   Filed Weights   06/24/18 0435 06/25/18 0500 06/26/18 0506  Weight: 62.7 kg 63.6 kg 62.4 kg    Exam: Patient is examined daily including today on 06/27/2018, exams remain the same as of yesterday except that has changed    General:  Drowsy, oriented x3  Cardiovascular: RRR  Respiratory: CTABL  Abdomen: Soft/ND/NT, positive BS  Musculoskeletal: No Edema, right aka  Neuro: drowsy, oriented x3  Data Reviewed: Basic Metabolic Panel: Recent Labs  Lab 06/22/18 1626 06/23/18 0505 06/24/18 0102 06/25/18 0340 06/26/18 0313 06/27/18 0424  NA  --  142 142 147* 147* 149*  K  --  4.0 4.0 3.6 3.8 4.5  CL  --  116* 118* 124* 123* 125*  CO2  --  17* 18* 15* 14* 12*  GLUCOSE  --  102* 108* 101* 121* 95  BUN  --  44* 40*  38* 47* 54*  CREATININE  --  3.98* 3.85* 3.50* 3.52* 3.24*  CALCIUM  --  7.1* 7.4* 7.6* 8.0* 8.3*  MG 1.9 2.1  --  2.2 2.0 2.1  PHOS 6.4* 4.8*  --  4.3 4.9* 4.8*   Liver Function Tests: Recent Labs  Lab 06/25/18 0340  AST 22  ALT 19  ALKPHOS 95  BILITOT 0.6  PROT 4.8*  ALBUMIN 1.8*   No results for input(s): LIPASE, AMYLASE in the last 168 hours. No results for input(s): AMMONIA in the last 168  hours. CBC: Recent Labs  Lab 06/23/18 0505 06/24/18 0102 06/25/18 0340 06/26/18 0313 06/27/18 0424  WBC 9.6 8.6 8.8 8.9 11.0*  NEUTROABS 7.3  --  6.2 6.7 8.9*  HGB 7.7* 7.7* 7.6* 7.1* 7.2*  HCT 25.1* 25.4* 23.9* 22.3* 23.4*  MCV 78.9* 79.6* 78.6* 77.4* 78.3*  PLT 152 141* 166 195 242   Cardiac Enzymes:   Recent Labs  Lab 06/24/18 0102  CKTOTAL 231  CKMB 4.2   BNP (last 3 results) No results for input(s): BNP in the last 8760 hours.  ProBNP (last 3 results) No results for input(s): PROBNP in the last 8760 hours.  CBG: Recent Labs  Lab 06/26/18 1223 06/26/18 1605 06/26/18 2009 06/27/18 0424 06/27/18 1126  GLUCAP 111* 98 95 95 105*    Recent Results (from the past 240 hour(s))  MRSA PCR Screening     Status: None   Collection Time: 06/21/18  2:46 AM  Result Value Ref Range Status   MRSA by PCR NEGATIVE NEGATIVE Final    Comment:        The GeneXpert MRSA Assay (FDA approved for NASAL specimens only), is one component of a comprehensive MRSA colonization surveillance program. It is not intended to diagnose MRSA infection nor to guide or monitor treatment for MRSA infections. Performed at [facility], [place] [address] [place], [place] [address]   Culture, blood (routine x 2)     Status: None   Collection Time: 06/21/18  1:00 PM  Result Value Ref Range Status   Specimen Description BLOOD LEFT ANTECUBITAL  Final   Special Requests   Final    BOTTLES DRAWN AEROBIC ONLY Blood Culture adequate volume   Culture   Final    NO GROWTH 5 DAYS Performed at [facility], [place] [address], [place], [place] [address]    Report Status 06/26/2018 FINAL  Final  Culture, blood (routine x 2)     Status: None   Collection Time: 06/21/18  1:06 PM  Result Value Ref Range Status   Specimen Description BLOOD RIGHT HAND  Final   Special Requests   Final    BOTTLES DRAWN AEROBIC ONLY Blood Culture adequate volume   Culture   Final    NO GROWTH 5 DAYS Performed  at [facility], [place] [address], [place], [place] [address]    Report Status 06/26/2018 FINAL  Final  Culture, respiratory (non-expectorated)     Status: None   Collection Time: 06/21/18  1:55 PM  Result Value Ref Range Status   Specimen Description TRACHEAL ASPIRATE  Final   Special Requests NONE  Final   Gram Stain   Final    ABUNDANT WBC PRESENT,BOTH PMN AND MONONUCLEAR ABUNDANT GRAM POSITIVE COCCI FEW GRAM VARIABLE ROD    Culture   Final    ABUNDANT Consistent with normal respiratory flora. Performed at [name], [place] [address] [place], [place] [address]  Report Status 06/23/2018 FINAL  Final     Studies: Dg Chest Port 1 View  Result Date: 06/27/2018 CLINICAL DATA:  Shortness of breath and respiratory failure EXAM: PORTABLE CHEST 1 VIEW COMPARISON:  Two days ago FINDINGS: Chronically elevated right diaphragm. Increased hazy opacity at the bases. Aortic tortuosity and borderline cardiomegaly. No pneumothorax. IMPRESSION: 1. Worsening aeration at the bases which could be atelectasis or infection. 2. Chronic elevation of the right diaphragm. Electronically Signed   By: Monte Fantasia M.D.   On: 06/27/2018 08:09    Scheduled Meds: . albuterol  2.5 mg Nebulization Q6H WA  . allopurinol  200 mg Per Tube Daily  . amLODipine  10 mg Per Tube Daily  . atorvastatin  20 mg Per Tube q1800  . chlorhexidine  15 mL Mouth Rinse BID  . cloNIDine  0.2 mg Per Tube TID  . colchicine  0.3 mg Per Tube Daily  . feeding supplement (PRO-STAT SUGAR FREE 64)  30 mL Per Tube TID  . feeding supplement (VITAL HIGH PROTEIN)  1,000 mL Per Tube Q24H  . folic acid  1 mg Per Tube Daily  . free water  200 mL Per Tube Q8H  . heparin  5,000 Units Subcutaneous Q8H  . hydrALAZINE  100 mg Per Tube TID  . levETIRAcetam  500 mg Per Tube BID  . mouth rinse  15 mL Mouth Rinse q12n4p  . metoprolol tartrate  50 mg Oral BID  . pantoprazole sodium  40 mg Per Tube Daily  . thiamine  100 mg Per Tube  Daily    Continuous Infusions: . sodium chloride 10 mL/hr at 06/26/18 1300  . clevidipine Stopped (06/26/18 1118)  . dexmedetomidine (PRECEDEX) IV infusion Stopped (06/26/18 0946)     Time spent: 102mins I have personally reviewed and interpreted on  06/27/2018 daily labs, tele strips, imagings as discussed above under date review session and assessment and plans.  I reviewed all nursing notes, pharmacy notes, consultant notes,  vitals, pertinent old records  I have discussed plan of care as described above with RN , patient on 06/27/2018   Florencia Reasons MD, PhD  [facility] Pager [phone]. If 7PM-7AM, please contact night-coverage at [email or web address], password [facility] 06/27/2018, 1:26 PM  LOS: 6 days

## 2018-06-28 ENCOUNTER — Inpatient Hospital Stay (HOSPITAL_COMMUNITY): Payer: Medicare Other

## 2018-06-28 DIAGNOSIS — R0789 Other chest pain: Secondary | ICD-10-CM

## 2018-06-28 DIAGNOSIS — R7989 Other specified abnormal findings of blood chemistry: Secondary | ICD-10-CM

## 2018-06-28 DIAGNOSIS — R079 Chest pain, unspecified: Secondary | ICD-10-CM

## 2018-06-28 DIAGNOSIS — R778 Other specified abnormalities of plasma proteins: Secondary | ICD-10-CM

## 2018-06-28 LAB — BASIC METABOLIC PANEL
Anion gap: 11 (ref 5–15)
BUN: 59 mg/dL — AB (ref 8–23)
CO2: 15 mmol/L — ABNORMAL LOW (ref 22–32)
Calcium: 8.7 mg/dL — ABNORMAL LOW (ref 8.9–10.3)
Chloride: 121 mmol/L — ABNORMAL HIGH (ref 98–111)
Creatinine, Ser: 3.12 mg/dL — ABNORMAL HIGH (ref 0.61–1.24)
GFR calc Af Amer: 22 mL/min — ABNORMAL LOW (ref >60)
GFR calc non Af Amer: 19 mL/min — ABNORMAL LOW (ref >60)
Glucose, Bld: 137 mg/dL — ABNORMAL HIGH (ref 70–99)
Potassium: 4.1 mmol/L (ref 3.5–5.1)
Sodium: 147 mmol/L — ABNORMAL HIGH (ref 135–145)

## 2018-06-28 LAB — CBC WITH DIFFERENTIAL/PLATELET
Abs Immature Granulocytes: 0.14 10*3/uL — ABNORMAL HIGH (ref 0.00–0.07)
Basophils Absolute: 0 10*3/uL (ref 0.0–0.1)
Basophils Relative: 0 %
Eosinophils Absolute: 0.2 10*3/uL (ref 0.0–0.5)
Eosinophils Relative: 2 %
HCT: 24 % — ABNORMAL LOW (ref 39.0–52.0)
Hemoglobin: 7.5 g/dL — ABNORMAL LOW (ref 13.0–17.0)
Immature Granulocytes: 1 %
Lymphocytes Relative: 9 %
Lymphs Abs: 1 10*3/uL (ref 0.7–4.0)
MCH: 24 pg — ABNORMAL LOW (ref 26.0–34.0)
MCHC: 31.3 g/dL (ref 30.0–36.0)
MCV: 76.9 fL — ABNORMAL LOW (ref 80.0–100.0)
Monocytes Absolute: 1.1 10*3/uL — ABNORMAL HIGH (ref 0.1–1.0)
Monocytes Relative: 9 %
Neutro Abs: 9.1 10*3/uL — ABNORMAL HIGH (ref 1.7–7.7)
Neutrophils Relative %: 79 %
Platelets: 290 10*3/uL (ref 150–400)
RBC: 3.12 MIL/uL — ABNORMAL LOW (ref 4.22–5.81)
RDW: 18.2 % — ABNORMAL HIGH (ref 11.5–15.5)
WBC: 11.6 10*3/uL — ABNORMAL HIGH (ref 4.0–10.5)
nRBC: 0 % (ref 0.0–0.2)

## 2018-06-28 LAB — GLUCOSE, CAPILLARY
Glucose-Capillary: 116 mg/dL — ABNORMAL HIGH (ref 70–99)
Glucose-Capillary: 141 mg/dL — ABNORMAL HIGH (ref 70–99)
Glucose-Capillary: 116 mg/dL — ABNORMAL HIGH (ref 70–99)
Glucose-Capillary: 129 mg/dL — ABNORMAL HIGH (ref 70–99)
Glucose-Capillary: 123 mg/dL — ABNORMAL HIGH (ref 70–99)

## 2018-06-28 LAB — TROPONIN I
TROPONIN I: 2.03 ng/mL — AB (ref <0.03)
Troponin I: 2.1 ng/mL (ref <0.03)

## 2018-06-28 LAB — ABO/RH: ABO/RH(D): O POS

## 2018-06-28 LAB — PREPARE RBC (CROSSMATCH)

## 2018-06-28 LAB — PHOSPHORUS: Phosphorus: 2.7 mg/dL (ref 2.5–4.6)

## 2018-06-28 LAB — MAGNESIUM: Magnesium: 2 mg/dL (ref 1.7–2.4)

## 2018-06-28 LAB — OCCULT BLOOD X 1 CARD TO LAB, STOOL: FECAL OCCULT BLD: POSITIVE — AB

## 2018-06-28 MED ORDER — PANTOPRAZOLE SODIUM 40 MG IV SOLR
40.0000 mg | Freq: Two times a day (BID) | INTRAVENOUS | Status: DC
  Administered 2018-06-28 – 2018-07-01 (×6): 40 mg via INTRAVENOUS
  Filled 2018-06-28 (×6): qty 40

## 2018-06-28 MED ORDER — FUROSEMIDE 10 MG/ML IJ SOLN
40.0000 mg | Freq: Once | INTRAMUSCULAR | Status: AC
  Administered 2018-06-28: 40 mg via INTRAVENOUS
  Filled 2018-06-28: qty 4

## 2018-06-28 MED ORDER — IPRATROPIUM-ALBUTEROL 0.5-2.5 (3) MG/3ML IN SOLN
3.0000 mL | Freq: Four times a day (QID) | RESPIRATORY_TRACT | Status: DC
  Administered 2018-06-28 – 2018-06-29 (×5): 3 mL via RESPIRATORY_TRACT
  Filled 2018-06-28 (×5): qty 3

## 2018-06-28 MED ORDER — IPRATROPIUM-ALBUTEROL 0.5-2.5 (3) MG/3ML IN SOLN
3.0000 mL | RESPIRATORY_TRACT | Status: AC
  Administered 2018-06-28: 3 mL via RESPIRATORY_TRACT
  Filled 2018-06-28: qty 3

## 2018-06-28 MED ORDER — MORPHINE SULFATE (PF) 2 MG/ML IV SOLN
INTRAVENOUS | Status: AC
  Administered 2018-06-28: 1 mg via INTRAVENOUS
  Filled 2018-06-28: qty 1

## 2018-06-28 MED ORDER — MORPHINE SULFATE (PF) 2 MG/ML IV SOLN
1.0000 mg | INTRAVENOUS | Status: DC | PRN
  Administered 2018-06-28: 1 mg via INTRAVENOUS

## 2018-06-28 MED ORDER — NITROGLYCERIN 0.4 MG SL SUBL
0.4000 mg | SUBLINGUAL_TABLET | SUBLINGUAL | Status: DC | PRN
  Administered 2018-06-28 (×6): 0.4 mg via SUBLINGUAL

## 2018-06-28 MED ORDER — FUROSEMIDE 10 MG/ML IJ SOLN
60.0000 mg | Freq: Once | INTRAMUSCULAR | Status: AC
  Administered 2018-06-28: 60 mg via INTRAVENOUS
  Filled 2018-06-28: qty 8

## 2018-06-28 MED ORDER — FAMOTIDINE IN NACL 20-0.9 MG/50ML-% IV SOLN
20.0000 mg | INTRAVENOUS | Status: DC
  Administered 2018-06-28 – 2018-06-29 (×2): 20 mg via INTRAVENOUS
  Filled 2018-06-28 (×2): qty 50

## 2018-06-28 MED ORDER — GUAIFENESIN ER 600 MG PO TB12: 600.0000 mg | ORAL_TABLET | Freq: Two times a day (BID) | ORAL | Status: DC

## 2018-06-28 MED ORDER — NITROGLYCERIN IN D5W 200-5 MCG/ML-% IV SOLN
0.0000 ug/min | INTRAVENOUS | Status: DC
  Administered 2018-06-28: 5 ug/min via INTRAVENOUS
  Filled 2018-06-28: qty 250

## 2018-06-28 MED ORDER — NITROGLYCERIN 0.4 MG SL SUBL
SUBLINGUAL_TABLET | SUBLINGUAL | Status: AC
  Administered 2018-06-28: 0.4 mg
  Filled 2018-06-28: qty 1

## 2018-06-28 MED ORDER — GUAIFENESIN 100 MG/5ML PO SOLN
5.0000 mL | Freq: Three times a day (TID) | ORAL | Status: DC
  Administered 2018-06-28 – 2018-07-02 (×14): 100 mg
  Filled 2018-06-28 (×10): qty 5
  Filled 2018-06-28: qty 25
  Filled 2018-06-28 (×2): qty 5

## 2018-06-28 MED ORDER — SODIUM CHLORIDE 0.9% IV SOLUTION: Freq: Once | INTRAVENOUS | Status: DC

## 2018-06-28 NOTE — Progress Notes (Signed)
Pt continued to have chest pain; nitro drip titrated to 20 mcg/min and pt voices pain better and resting in bed with call light within reach. Will continue to closely monitor. Delia Heady RN
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
## 2018-06-28 NOTE — Progress Notes (Signed)
Pt transferring to [place] and report called off to nurse Delcia RN on the unit. P.[employer] RN
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
## 2018-06-28 NOTE — Consult Note (Addendum)
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 [phone]              Assessment  and Plan:   Chest pain with elevated troponin -Presented with left-sided, weakness headache and confusion.  Found to have PRES and was intubated for several days.  He is now extubated and out of ICU.  Continues to be mildly confused.  This morning he complained of chest pain and shortness of breath which was new since yesterday.  EKG was nonacute.  Troponin was elevated at 2.03.  Pain resolved with nitroglycerin.  Repeat chest pain this afternoon with sublingual nitroglycerin X3 reducing his pain from 8 down to 2.  Was also given nebs and breathing is better this afternoon.  My examination he is wheezing and continues to complain of right-sided chest pain that is constant and sharp.  -Hospitalist has concern for myocardial ischemia.  The patient is anemic with hemoglobin of 7.5 so heparin has not been initiated. -CXR is unremarkable.  -Cardiovascular risk factors include longtime smoker, hypertension, hyperlipidemia, prior MI.  He has high  probability for CAD. -Patient has acute on chronic kidney disease with serum creatinine 3.12 today. -Echocardiogram done on 06/21/2018 showed normal LV systolic function with EF 60-65%, severely increased left ventricular wall thickness, mild diastolic dysfunction, normal right ventricular systolic function with no increase in right ventricular wall thickness. -With his renal function and anemia, we hesitate to take him for cardiac catheterization at this time.  We will initiate IV nitroglycerin to help with chest pain and possibly improve blood pressure.  We will give a dose of Lasix IV 40 mg.   -We will continue to follow.  Hypertensive emergency/PRES/seizure/delirium -Patient was intubated for airway protection, extubated 2/16 -BP is now improved.  Patient continues to be mildly agitated, ?Somewhat confused  CKD -Baseline creatinine in 2019 was~1.8- 2.4 -Creatinine on admission was 3.28 and rose to 4.01, 3.12 today. -Recommend nephrology consult.  Anemia -Hemoglobin 7.5 today -Dr. Erlinda Hong is considering transfusing as this might help his current symptoms.  Hypertension -Initially required Cleviprex.  -Was on amlodipine 10 mg, clonidine 0.1 mg BID, hydralazine 100 mg TID, metoprolol tartrate 100 mg bid at home.  -Now on amlodipine 10 mg, clonidine 0.2 mg 3 times daily, metoprolol tartrate 50 mg twice daily.  Primary team is titrating -Watch blood pressure with addition of IV nitroglycerin.  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 -Shortness of breath and wheezing this morning improved with nebulizer, but currently has wheezing and shortness of breath on my exam.  Hyperlipidemia -On atorvastatin 20 mg daily.  Lipid panel on 06/21/2018 showed LDL of 90.      For questions or updates, please contact [place] Please consult [email or web address] for contact info  under     Signed, Daune Perch, NP  06/28/2018 5:07 PM   I have personally seen and examined this patient with Daune Perch, NP I agree with the assessment and plan as outlined above. He is a 73 yo male with long standing HTN, chronic kidney disease, HLD, leukemia and ongoing tobacco abuse admitted with altered mental status due to accelerated HTN and PRES found on MRI. He was intubated initially. He is now extubated. He reports remote MI but no recent cardiac cath. Negative stress test in 2018. Echo last week with normal LV systolic function with severe LVH. He has been complaining of chest pain today. First troponin 2.0. There are no other troponin levels during this hospital stay. He is anemic with Hgb 7.5 today. Creatinine  is over 3.0. Reportedly had improvement of chest pain with NTG.  All labs reviewed by me. EKG reviewed by me and shows sinus with LVH and strain pattern. No acute changes.   My exam:  General: Thin cachectic male who appears uncomfortable.   HEENT: OP clear, mucus membranes moist  SKIN: warm, dry. No rashes. Neuro: No focal deficits  Musculoskeletal: Muscle strength 5/5 all ext. Right AKA Psychiatric: Mood and affect normal  Neck: + JVD, no carotid bruits, no thyromegaly, no lymphadenopathy.  Lungs:Clear bilaterally, no wheezes, rhonci, crackles Cardiovascular: Regular rate and rhythm. No murmurs, gallops or rubs. Abdomen:Soft. Bowel sounds present. Non-tender.  Extremities: No left LE edema. Right AKA Pulses are 2 + in the bilateral DP/PT.  Plan: Chest pain and elevated troponin in a patient with multiple co-morbidities. He likely has significant underlying CAD. It is unclear where the troponin elevation is on a curve. He may now have declining troponin given his last 7 days of acute illness. He is a poor candidate for cardiac catheterization given his advanced renal failure and anemia. His EKG shows no acute changes so I do not think a cardiac cath is urgently  indicated right now. It may be helpful to consider transfusion. I would consider Nephrology involvement. Cycle troponin. We will start IV NTG tonight. Will give one dose of Lasix for possible mild volume overload.  Consideration for ischemic evaluation in the future. Per primary team, will not use IV heparin given anemia.    Lauree Chandler 06/28/2018 5:30 PM

## 2018-06-28 NOTE — Progress Notes (Signed)
Pt alert and verbally responsive; pt transported off unit via bed with belongings to the side. Delia Heady RN

## 2018-06-28 NOTE — Progress Notes (Signed)
Pt c/o chest pain of 8 and prn nitro SL given x5; pain down to 3 on scale of (0-10). Pt remains on 2L oxygen; MD notified and new orders received; EKG done and on chart. Will continue to closely monitor pts. Francis Gaines Anelise Staron RN   06/28/18 1607  Vitals  BP (!) 142/79  MAP (mmHg) 99  BP Location Right Arm  BP Method Automatic  Patient Position (if appropriate) Lying  Pulse Rate 81  Pulse Rate Source Monitor  Resp 20  Oxygen Therapy  SpO2 96 %  O2 Device Nasal Cannula  O2 Flow Rate (L/min) 2 L/min  MEWS Score  MEWS RR 0  MEWS Pulse 0  MEWS Systolic 0  MEWS LOC 0  MEWS Temp 0  MEWS Score 0  MEWS Score Color Nyoka Cowden

## 2018-06-28 NOTE — Progress Notes (Signed)
Blood bank called concerning pt's ordered blood; they wanted to know since pt has leukemia and on Chemo; if the blood should be radiated before transfusing. NP on-call notified; NP returned call back to inform RN Yes blood bank should radiate the blood. Blood bank called and notified. Delia Heady RN

## 2018-06-29 DIAGNOSIS — G9341 Metabolic encephalopathy: Secondary | ICD-10-CM

## 2018-06-29 LAB — BASIC METABOLIC PANEL
Anion gap: 10 (ref 5–15)
BUN: 57 mg/dL — ABNORMAL HIGH (ref 8–23)
CO2: 19 mmol/L — ABNORMAL LOW (ref 22–32)
Calcium: 8.6 mg/dL — ABNORMAL LOW (ref 8.9–10.3)
Chloride: 121 mmol/L — ABNORMAL HIGH (ref 98–111)
Creatinine, Ser: 3.25 mg/dL — ABNORMAL HIGH (ref 0.61–1.24)
GFR calc Af Amer: 21 mL/min — ABNORMAL LOW (ref >60)
GFR calc non Af Amer: 18 mL/min — ABNORMAL LOW (ref >60)
Glucose, Bld: 106 mg/dL — ABNORMAL HIGH (ref 70–99)
Potassium: 3.7 mmol/L (ref 3.5–5.1)
Sodium: 150 mmol/L — ABNORMAL HIGH (ref 135–145)

## 2018-06-29 LAB — GLUCOSE, CAPILLARY
Glucose-Capillary: 94 mg/dL (ref 70–99)
Glucose-Capillary: 108 mg/dL — ABNORMAL HIGH (ref 70–99)
Glucose-Capillary: 105 mg/dL — ABNORMAL HIGH (ref 70–99)
Glucose-Capillary: 107 mg/dL — ABNORMAL HIGH (ref 70–99)

## 2018-06-29 LAB — LIPASE, BLOOD: Lipase: 23 U/L (ref 11–51)

## 2018-06-29 LAB — HEMOGLOBIN AND HEMATOCRIT, BLOOD
HCT: 24.8 % — ABNORMAL LOW (ref 39.0–52.0)
Hemoglobin: 7.9 g/dL — ABNORMAL LOW (ref 13.0–17.0)

## 2018-06-29 LAB — TROPONIN I: TROPONIN I: 2.06 ng/mL — AB (ref <0.03)

## 2018-06-29 MED ORDER — SODIUM CHLORIDE 0.9 % IV SOLN
INTRAVENOUS | Status: DC
  Administered 2018-06-29: 16:00:00 via INTRAVENOUS

## 2018-06-29 MED ORDER — GABAPENTIN 250 MG/5ML PO SOLN
100.0000 mg | Freq: Two times a day (BID) | ORAL | Status: DC
  Administered 2018-06-30 – 2018-07-01 (×3): 100 mg
  Filled 2018-06-29 (×6): qty 2

## 2018-06-29 MED ORDER — LORAZEPAM 2 MG/ML IJ SOLN
0.5000 mg | Freq: Once | INTRAMUSCULAR | Status: AC
  Administered 2018-06-29: 0.5 mg via INTRAVENOUS
  Filled 2018-06-29: qty 1

## 2018-06-29 MED ORDER — OLANZAPINE 2.5 MG PO TABS
2.5000 mg | ORAL_TABLET | Freq: Once | ORAL | Status: AC
  Filled 2018-06-29: qty 1

## 2018-06-29 MED ORDER — FUROSEMIDE 10 MG/ML IJ SOLN
80.0000 mg | Freq: Once | INTRAMUSCULAR | Status: AC
  Administered 2018-06-29: 80 mg via INTRAVENOUS
  Filled 2018-06-29: qty 8

## 2018-06-29 MED ORDER — LORAZEPAM 2 MG/ML IJ SOLN
0.5000 mg | Freq: Four times a day (QID) | INTRAMUSCULAR | Status: DC | PRN
  Administered 2018-06-30 – 2018-07-02 (×2): 0.5 mg via INTRAVENOUS
  Filled 2018-06-29 (×2): qty 1

## 2018-06-29 MED ORDER — OLANZAPINE 5 MG PO TABS
2.5000 mg | ORAL_TABLET | Freq: Every day | ORAL | Status: DC
  Administered 2018-06-29 – 2018-07-02 (×4): 2.5 mg
  Filled 2018-06-29: qty 0.5
  Filled 2018-06-29 (×4): qty 1
  Filled 2018-06-29: qty 0.5

## 2018-06-29 MED ORDER — FREE WATER
200.0000 mL | Status: DC
  Administered 2018-06-29 – 2018-06-30 (×6): 200 mL

## 2018-06-29 MED ORDER — LORAZEPAM 2 MG/ML IJ SOLN
INTRAMUSCULAR | Status: AC
  Filled 2018-06-29: qty 1

## 2018-06-29 MED ORDER — LORAZEPAM 2 MG/ML IJ SOLN
1.0000 mg | Freq: Four times a day (QID) | INTRAMUSCULAR | Status: DC | PRN
  Administered 2018-06-29: 1 mg via INTRAVENOUS

## 2018-06-29 MED ORDER — ISOSORBIDE DINITRATE 10 MG PO TABS
5.0000 mg | ORAL_TABLET | Freq: Three times a day (TID) | ORAL | Status: DC
  Administered 2018-06-29 – 2018-06-30 (×3): 5 mg via NASOGASTRIC
  Filled 2018-06-29 (×3): qty 1

## 2018-06-29 MED ORDER — LORAZEPAM 1 MG PO TABS
1.0000 mg | ORAL_TABLET | Freq: Once | ORAL | Status: AC
  Administered 2018-06-29: 1 mg via ORAL
  Filled 2018-06-29: qty 1

## 2018-06-29 NOTE — Progress Notes (Signed)
Pt became very restless, anxious, trying to pull out his feeding tube, climbing out of bed. Notified MD, ativan ordererd and given.

## 2018-06-29 NOTE — Progress Notes (Signed)
PT Cancellation Note  Patient Details Name: [name] MRN: [id number] DOB: 1946/01/10   Cancelled Treatment:    Reason Eval/Treat Not Completed: Patient's level of consciousness.  Asleep and cannot awaken.  Will try again at another time.   Ramond Dial 06/29/2018, 9:50 AM   Mee Hives, PT MS Acute Re[facility] Dept. Number: [place] [facility] [place]
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
## 2018-06-29 NOTE — Progress Notes (Signed)
Patient woke up restless and anxious, lungs were checked had wheezing sounds.  Patient was given one treatment of albuterol to help with breathing.  Still on [place] at 4L of O2.  I will keep monitoring patient.

## 2018-06-30 DIAGNOSIS — D509 Iron deficiency anemia, unspecified: Secondary | ICD-10-CM

## 2018-06-30 DIAGNOSIS — R195 Other fecal abnormalities: Secondary | ICD-10-CM

## 2018-06-30 DIAGNOSIS — I160 Hypertensive urgency: Secondary | ICD-10-CM

## 2018-06-30 DIAGNOSIS — E870 Hyperosmolality and hypernatremia: Secondary | ICD-10-CM

## 2018-06-30 LAB — BASIC METABOLIC PANEL
Anion gap: 10 (ref 5–15)
BUN: 55 mg/dL — ABNORMAL HIGH (ref 8–23)
CO2: 19 mmol/L — ABNORMAL LOW (ref 22–32)
Calcium: 8.7 mg/dL — ABNORMAL LOW (ref 8.9–10.3)
Chloride: 121 mmol/L — ABNORMAL HIGH (ref 98–111)
Creatinine, Ser: 3.27 mg/dL — ABNORMAL HIGH (ref 0.61–1.24)
GFR calc Af Amer: 21 mL/min — ABNORMAL LOW (ref >60)
GFR calc non Af Amer: 18 mL/min — ABNORMAL LOW (ref >60)
Glucose, Bld: 103 mg/dL — ABNORMAL HIGH (ref 70–99)
Potassium: 4.4 mmol/L (ref 3.5–5.1)
Sodium: 150 mmol/L — ABNORMAL HIGH (ref 135–145)

## 2018-06-30 LAB — GLUCOSE, CAPILLARY
Glucose-Capillary: 102 mg/dL — ABNORMAL HIGH (ref 70–99)
Glucose-Capillary: 107 mg/dL — ABNORMAL HIGH (ref 70–99)
Glucose-Capillary: 110 mg/dL — ABNORMAL HIGH (ref 70–99)
Glucose-Capillary: 115 mg/dL — ABNORMAL HIGH (ref 70–99)
Glucose-Capillary: 95 mg/dL (ref 70–99)
Glucose-Capillary: 95 mg/dL (ref 70–99)

## 2018-06-30 MED ORDER — IPRATROPIUM-ALBUTEROL 0.5-2.5 (3) MG/3ML IN SOLN
3.0000 mL | Freq: Three times a day (TID) | RESPIRATORY_TRACT | Status: DC
  Administered 2018-06-30 – 2018-07-02 (×7): 3 mL via RESPIRATORY_TRACT
  Filled 2018-06-30 (×9): qty 3

## 2018-06-30 MED ORDER — ISOSORBIDE DINITRATE 10 MG PO TABS
10.0000 mg | ORAL_TABLET | Freq: Three times a day (TID) | ORAL | Status: DC
  Administered 2018-06-30 – 2018-07-01 (×3): 10 mg via NASOGASTRIC
  Filled 2018-06-30 (×3): qty 1

## 2018-06-30 MED ORDER — FREE WATER
200.0000 mL | Status: DC
  Administered 2018-06-30 – 2018-07-01 (×7): 200 mL

## 2018-06-30 NOTE — Progress Notes (Signed)
Patient was feeling restless and wheezing.  Gave albuterol treatment to help with his breathing.  I will keep monitoring patient.
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
## 2018-06-30 NOTE — Progress Notes (Signed)
PROGRESS NOTE  [name] AJO:[id number] DOB: 1946-03-25 DOA: 06/20/2018 PCP: [name], MD  Brief summary:  73 y/o male admitted with seizure in setting of PRES, extubated on 2/16 Transferred to [facility] on 2/19    HPI/Recap of past 24 hours:   He received one dose of iv ativan last night, he is drowsy this am He is on room air, no hypoxia, no fever I did not notice cough during encounter, no wheezing on exam  cortrak in place tube feeding stopped due to planned EGD   progressing slowly with speech/swallow due to fluctuating mental status  Safety sitter in room  Assessment/Plan: Active Problems:   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[place])   Essential hypertension   Acute respiratory failure with hypoxemia (HCC)   Acute metabolic encephalopathy   Chronic pancreatitis (HCC)   Idiopathic chronic gout of multiple sites without tophus   Stroke ([place])   CKD (chronic kidney disease), stage III (HCC)   Seizure ([place])   Status epilepticus ([place])   Malnutrition of moderate degree   CKD (chronic kidney disease), stage IV (HCC)   Chest pain of uncertain etiology   Elevated troponin  Hypertensive emergency/PRES (presenting symptom) -he required airway protection, extubated on 2/16 -he is off  Cleviprex,  -he was started on nitrodrip due to chest pain and elevated blood pressure on 2/20,  bp is improving, he denies chest pain, now off nitrodrip, started on isosobie tid and titrate up as tolerated -continue norvasc, clonidine, hydralazine , lopressor  Possible seizure -he is off precedex drip, off propofol drip, currently on keppra  delirium -started low dose neurontin bid and night time zyprexa for delirium/agitation  -try to avoid prn ativan -sitter in room  Chest pain, troponin elevation ( on 2/20): -patient has severe chest pain, elevated blood pressure on 2/20, troponin at 2 -cardiology consulted, he is started on nitrodrip, chest pain has resolved,  -gi consulted to evaluate  risk of bleeding , currently not on asa or heparin -bp control, continue betablocker, statin, cardiology/gi input appreciated   Anemia: hgb 7.2, received prbc x1 on 2/20 FOBT + GI consulted , EGD planned, will follow gi recommendation   Hypernatremia: Remain high, Increase free water flush to q3hrs , repeat bmp  Possible alcohol abuse, h/p chronic pancreatitis -daughter reports patient does not drink daily, patient does binge drinking when he feels depressed, daughter states he only does binge drinking for a few days in a row, it only happens once a year > thiamine, folate   Dysphagia: S/p MBS  SLP Diet Recommendations: Nectar thick liquid;Other (Comment)(full liquid diet)   Liquid Administration via: Straw   Medication Administration: Crushed with puree   Supervision: Staff to assist with self feeding;Full supervision/cueing for compensatory strategies   Compensations: Minimize environmental distractions;Slow rate;Small sips/bites;Follow solids with liquid   Postural Changes: Seated upright at 90 degrees   Oral Care Recommendations: Oral care BID   Other Recommendations: Order thickener from pharmacy;Prohibited food (jello, ice cream, thin soups);Remove water pitcher;Have oral suction available  CKDIV Cr at baseline Renal dosing meds    Chronic leukemia ([facility]); Under the care of Dr. [name]. On bosutinib  Gout; On allopurinol/colchicine  Non-severe (moderate) malnutrition in context of chronic illness Nutrition input appreciated  Smoker/one pack a day -Mild centrilobular emphysema on CT chest from 02/2017 COPD,  Diffuse wheezing on 2/20 and 2/21, no wheezing today  Continue schedule nebs and mucinex   Code Status: full  Family Communication: patient , daughter over the phone on  2/21  Disposition Plan: likely will need snf   Consultants:  Critical care  Neurology  GI   cardiology  Procedures:  cortrak feeding tube  placement  EEG  Intubation/extubation   Antibiotics:  none   Objective: BP (!) 151/84 (BP Location: Right Arm)   Pulse 63   Temp 97.8 F (36.6 C) (Axillary)   Resp (!) 21   Ht 5\' 9"  (1.753 m)   Wt 58.7 kg   SpO2 97%   BMI 19.11 kg/m   Intake/Output Summary (Last 24 hours) at 06/30/2018 1154 Last data filed at 06/30/2018 1100 Gross per 24 hour  Intake 729.36 ml  Output 1640 ml  Net -910.64 ml   Filed Weights   06/26/18 0506 06/28/18 0340 06/29/18 0513  Weight: 62.4 kg 63.1 kg 58.7 kg    Exam: Patient is examined daily including today on 06/30/2018, exams remain the same as of yesterday except that has changed    General:  drowsy  Cardiovascular: RRR  Respiratory: no wheezing, improved aeration  Abdomen: Soft/ND/NT, positive BS  Musculoskeletal: No Edema, right aka  Neuro: drowsy  Data Reviewed: Basic Metabolic Panel: Recent Labs  Lab 06/25/18 0340 06/26/18 0313 06/27/18 0424 06/28/18 0537 06/29/18 0546 06/30/18 0340  NA 147* 147* 149* 147* 150* 150*  K 3.6 3.8 4.5 4.1 3.7 4.4  CL 124* 123* 125* 121* 121* 121*  CO2 15* 14* 12* 15* 19* 19*  GLUCOSE 101* 121* 95 137* 106* 103*  BUN 38* 47* 54* 59* 57* 55*  CREATININE 3.50* 3.52* 3.24* 3.12* 3.25* 3.27*  CALCIUM 7.6* 8.0* 8.3* 8.7* 8.6* 8.7*  MG 2.2 2.0 2.1 2.0  --   --   PHOS 4.3 4.9* 4.8* 2.7  --   --    Liver Function Tests: Recent Labs  Lab 06/25/18 0340  AST 22  ALT 19  ALKPHOS 95  BILITOT 0.6  PROT 4.8*  ALBUMIN 1.8*   Recent Labs  Lab 06/29/18 0546  LIPASE 23   No results for input(s): AMMONIA in the last 168 hours. CBC: Recent Labs  Lab 06/24/18 0102 06/25/18 0340 06/26/18 0313 06/27/18 0424 06/28/18 0537 06/29/18 0546  WBC 8.6 8.8 8.9 11.0* 11.6*  --   NEUTROABS  --  6.2 6.7 8.9* 9.1*  --   HGB 7.7* 7.6* 7.1* 7.2* 7.5* 7.9*  HCT 25.4* 23.9* 22.3* 23.4* 24.0* 24.8*  MCV 79.6* 78.6* 77.4* 78.3* 76.9*  --   PLT 141* 166 195 242 290  --    Cardiac Enzymes:   Recent  Labs  Lab 06/24/18 0102 06/28/18 1209 06/28/18 1652 06/28/18 2316  CKTOTAL 231  --   --   --   CKMB 4.2  --   --   --   TROPONINI  --  2.03* 2.10* 2.06*   BNP (last 3 results) No results for input(s): BNP in the last 8760 hours.  ProBNP (last 3 results) No results for input(s): PROBNP in the last 8760 hours.  CBG: Recent Labs  Lab 06/29/18 1948 06/30/18 0003 06/30/18 0518 06/30/18 0803 06/30/18 1137  GLUCAP 107* 102* 95 95 107*    Recent Results (from the past 240 hour(s))  MRSA PCR Screening     Status: None   Collection Time: 06/21/18  2:46 AM  Result Value Ref Range Status   MRSA by PCR NEGATIVE NEGATIVE Final    Comment:        The GeneXpert MRSA Assay (FDA approved for NASAL specimens only), is one component of a  comprehensive MRSA colonization surveillance program. It is not intended to diagnose MRSA infection nor to guide or monitor treatment for MRSA infections. Performed at [facility], [place] [address] [place], [place] [address]   Culture, blood (routine x 2)     Status: None   Collection Time: 06/21/18  1:00 PM  Result Value Ref Range Status   Specimen Description BLOOD LEFT ANTECUBITAL  Final   Special Requests   Final    BOTTLES DRAWN AEROBIC ONLY Blood Culture adequate volume   Culture   Final    NO GROWTH 5 DAYS Performed at [facility], [place] [address] [place], [place] [address]    Report Status 06/26/2018 FINAL  Final  Culture, blood (routine x 2)     Status: None   Collection Time: 06/21/18  1:06 PM  Result Value Ref Range Status   Specimen Description BLOOD RIGHT HAND  Final   Special Requests   Final    BOTTLES DRAWN AEROBIC ONLY Blood Culture adequate volume   Culture   Final    NO GROWTH 5 DAYS Performed at [facility], [place] [address], [place], [place] [address]    Report Status 06/26/2018 FINAL  Final  Culture, respiratory (non-expectorated)     Status: None   Collection Time: 06/21/18  1:55 PM  Result  Value Ref Range Status   Specimen Description TRACHEAL ASPIRATE  Final   Special Requests NONE  Final   Gram Stain   Final    ABUNDANT WBC PRESENT,BOTH PMN AND MONONUCLEAR ABUNDANT GRAM POSITIVE COCCI FEW GRAM VARIABLE ROD    Culture   Final    ABUNDANT Consistent with normal respiratory flora. Performed at [facility], [place] [address], [place],  [address]    Report Status 06/23/2018 FINAL  Final     Studies: No results found.  Scheduled Meds: . sodium chloride   Intravenous Once  . allopurinol  200 mg Per Tube Daily  . amLODipine  10 mg Per Tube Daily  . atorvastatin  20 mg Per Tube q1800  . chlorhexidine  15 mL Mouth Rinse BID  . cloNIDine  0.2 mg Per Tube TID  . colchicine  0.3 mg Per Tube Daily  . feeding supplement (ENSURE ENLIVE)  237 mL Oral Q1500  . folic acid  1 mg Per Tube Daily  . free water  200 mL Per Tube Q3H  . gabapentin  100 mg Per Tube BID  . guaiFENesin  5 mL Per Tube TID  . heparin  5,000 Units Subcutaneous Q8H  . hydrALAZINE  100 mg Per Tube TID  . ipratropium-albuterol  3 mL Nebulization TID  . isosorbide dinitrate  10 mg Per NG tube TID  . levETIRAcetam  500 mg Per Tube BID  . mouth rinse  15 mL Mouth Rinse q12n4p  . metoprolol tartrate  50 mg Oral BID  . OLANZapine  2.5 mg Per Tube QHS  . pantoprazole (PROTONIX) IV  40 mg Intravenous Q12H  . thiamine  100 mg Per Tube Daily    Continuous Infusions: . sodium chloride 20 mL/hr at 06/30/18 0707  . feeding supplement (VITAL HIGH PROTEIN) 1,000 mL (06/28/18 0434)  . nitroGLYCERIN Stopped (06/29/18 1130)     Time spent: 56mins,  I have personally reviewed and interpreted on  06/30/2018 daily labs, tele strips, imagings as discussed above under date review session and assessment and plans.  I reviewed all nursing notes, pharmacy notes, consultant notes,  vitals, pertinent old records  I have discussed plan of care as described above with RN , patient on 06/30/2018   Florencia Reasons MD,  PhD  [facility] Pager [phone]. If 7PM-7AM, please contact night-coverage at [email or web address], password [facility] 06/30/2018, 11:54 AM  LOS: 9 days

## 2018-06-30 NOTE — Plan of Care (Signed)
  Problem: Health Behavior/Discharge Planning: Goal: Ability to manage health-related needs will improve Outcome: Progressing   Problem: Clinical Measurements: Goal: Ability to maintain clinical measurements within normal limits will improve Outcome: Progressing   Problem: Pain Managment: Goal: General experience of comfort will improve Outcome: Progressing

## 2018-06-30 NOTE — Progress Notes (Signed)
   Progress Note  Patient Name: [name] Date of Encounter: 06/30/2018  Primary Cardiologist: Dr. Candee Furbish  I reviewed the chart and recent cardiology follow-up notes.  Blood pressures remain elevated, heart rate in the 60s to 80s in sinus rhythm by telemetry.  Currently on Norvasc 10 mg daily, clonidine 0.2 mg 3 times daily, hydralazine 100 mg 3 times daily, Isordil 5 mg 3 times daily, Lopressor 50 mg twice daily, and IV nitroglycerin.  Lab work shows potassium 4.4, BUN 55, creatinine 3.27, troponin I 2.06.  Hemoglobin 7.9 with heme positive stool.  I personally reviewed the ECG from 06/28/2018 which shows sinus rhythm with LVH and repolarization abnormalities.  Echocardiogram reports LVEF 60 to 65% with severe LVH, diastolic dysfunction, no focal wall motion abnormalities.  Invasive cardiac testing is not planned at this time.  Would attempt to increase Isordil further to 10 mg 3 times daily and possibly wean off of IV nitroglycerin.  Further GI work-up is pending.  We are following as well.  Signed, Rozann Lesches, MD  06/30/2018, 10:43 AM

## 2018-06-30 NOTE — Plan of Care (Signed)
  Problem: Education: Goal: Knowledge of General Education information will improve Description: Including pain rating scale, medication(s)/side effects and non-pharmacologic comfort measures Outcome: Progressing   Problem: Clinical Measurements: Goal: Respiratory complications will improve Outcome: Progressing   Problem: Activity: Goal: Risk for activity intolerance will decrease Outcome: Progressing   

## 2018-06-30 NOTE — Progress Notes (Signed)
Pt has had more appetite today, tolerated PO well, ate 100% lunch and 80% dinner.

## 2018-07-01 LAB — BASIC METABOLIC PANEL
Anion gap: 9 (ref 5–15)
BUN: 49 mg/dL — ABNORMAL HIGH (ref 8–23)
CO2: 19 mmol/L — ABNORMAL LOW (ref 22–32)
Calcium: 8.3 mg/dL — ABNORMAL LOW (ref 8.9–10.3)
Chloride: 115 mmol/L — ABNORMAL HIGH (ref 98–111)
Creatinine, Ser: 3.18 mg/dL — ABNORMAL HIGH (ref 0.61–1.24)
GFR calc Af Amer: 21 mL/min — ABNORMAL LOW (ref >60)
GFR, EST NON AFRICAN AMERICAN: 18 mL/min — AB (ref >60)
Glucose, Bld: 105 mg/dL — ABNORMAL HIGH (ref 70–99)
POTASSIUM: 4 mmol/L (ref 3.5–5.1)
Sodium: 143 mmol/L (ref 135–145)

## 2018-07-01 LAB — GLUCOSE, CAPILLARY
GLUCOSE-CAPILLARY: 104 mg/dL — AB (ref 70–99)
Glucose-Capillary: 92 mg/dL (ref 70–99)
Glucose-Capillary: 120 mg/dL — ABNORMAL HIGH (ref 70–99)
Glucose-Capillary: 101 mg/dL — ABNORMAL HIGH (ref 70–99)
Glucose-Capillary: 97 mg/dL (ref 70–99)

## 2018-07-01 MED ORDER — FREE WATER
200.0000 mL | Freq: Three times a day (TID) | Status: DC
  Administered 2018-07-01: 200 mL

## 2018-07-01 MED ORDER — CLONIDINE HCL 0.1 MG PO TABS
0.1000 mg | ORAL_TABLET | Freq: Three times a day (TID) | ORAL | Status: DC
  Administered 2018-07-01 – 2018-07-02 (×5): 0.1 mg
  Filled 2018-07-01 (×5): qty 1

## 2018-07-01 MED ORDER — GABAPENTIN 250 MG/5ML PO SOLN
100.0000 mg | Freq: Every day | ORAL | Status: DC
  Filled 2018-07-01: qty 2

## 2018-07-01 MED ORDER — PANTOPRAZOLE SODIUM 40 MG PO TBEC
40.0000 mg | DELAYED_RELEASE_TABLET | Freq: Every day | ORAL | Status: DC
  Administered 2018-07-02: 40 mg via ORAL
  Filled 2018-07-01: qty 1

## 2018-07-01 MED ORDER — ISOSORBIDE DINITRATE 10 MG PO TABS
20.0000 mg | ORAL_TABLET | Freq: Three times a day (TID) | ORAL | Status: DC
  Administered 2018-07-01 (×2): 20 mg via NASOGASTRIC
  Filled 2018-07-01 (×2): qty 2

## 2018-07-01 NOTE — Progress Notes (Addendum)
PROGRESS NOTE  [name] UJW:[id number] DOB: 10-05-1945 DOA: 06/20/2018 PCP: [name], MD  Brief summary:  73 y/o male admitted with possible seizure in setting of PRES, extubated on 2/16 Transferred to [facility] on 2/19 developed chest pain, htn, troponin elevation on 2/20, cardiology consulted Has FOBT + ,GI consulted  HPI/Recap of past 24 hours:  He did not require any prn agitation meds last night, he is drowsy this am bp elevated, denies pain, no hypoxia on room air, no fever Condom cath in place with clear urine cortrak in place He remains has mittens on to avoid pulling on things    progressing slowly with speech/swallow due to fluctuating mental status   Assessment/Plan: Active Problems:   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HCC)   Essential hypertension   Acute respiratory failure with hypoxemia (HCC)   Acute metabolic encephalopathy   Chronic pancreatitis (HCC)   Idiopathic chronic gout of multiple sites without tophus   Stroke ([place])   CKD (chronic kidney disease), stage III (HCC)   Seizure (HCC)   Status epilepticus (HCC)   Malnutrition of moderate degree   CKD (chronic kidney disease), stage IV (HCC)   Chest pain of uncertain etiology   Troponin level elevated   Occult blood in stools   Microcytic anemia  Hypertensive emergency/PRES (presenting symptom) -he required airway protection, extubated on 2/16 -he is off  Cleviprex,  -he was started on nitrodrip due to chest pain and elevated blood pressure on 2/20,  bp is improving, he denies chest pain, now off nitrodrip, started on isosobie tid and titrate up as tolerated -continue norvasc, clonidine, hydralazine , lopressor, imdur, bp remain elevated, will increase imdur, will try to decdrease clonidine due to drowsiness.   Possible seizure -he is off precedex drip, off propofol drip, currently on keppra  delirium -started low dose neurontin bid and night time zyprexa for delirium/agitation  -try to avoid prn  ativan -sitter in room prn -he appear more drowsy, will change neurontin to qhs  Chest pain, troponin elevation ( on 2/20): -patient has severe chest pain, elevated blood pressure on 2/20, troponin at 2 -cardiology consulted, he is started on nitrodrip, chest pain has resolved,  -gi consulted to evaluate risk of bleeding , currently not on asa or heparin -bp control, continue betablocker, statin, cardiology/gi input appreciated   Anemia: hgb 7.2, received prbc x1 on 2/20 FOBT + GI consulted , EGD planned, will follow gi recommendation Hold heparin, start scd's   Hypernatremia: Sodium peaked at 150, normalized after  free water flushes per tube, change back  to q3hrs , repeat bmp  Possible alcohol abuse, h/p chronic pancreatitis -daughter reports patient does not drink daily, patient does binge drinking when he feels depressed, daughter states he only does binge drinking for a few days in a row, it only happens once a year > thiamine, folate   Dysphagia: S/p MBS  SLP Diet Recommendations: Nectar thick liquid;Other (Comment)(full liquid diet)   Liquid Administration via: Straw   Medication Administration: Crushed with puree   Supervision: Staff to assist with self feeding;Full supervision/cueing for compensatory strategies   Compensations: Minimize environmental distractions;Slow rate;Small sips/bites;Follow solids with liquid   Postural Changes: Seated upright at 90 degrees   Oral Care Recommendations: Oral care BID   Other Recommendations: Order thickener from pharmacy;Prohibited food (jello, ice cream, thin soups);Remove water pitcher;Have oral suction available  CKDIV Cr at baseline Renal dosing meds    Chronic leukemia (CML); Under the care of Dr. [name]. On bosutinib  Gout; On allopurinol/colchicine  Non-severe (moderate) malnutrition in context of chronic illness Nutrition input appreciated  Smoker/one pack a day -Mild centrilobular emphysema  on CT chest from 02/2017 COPD,  Diffuse wheezing on 2/20 and 2/21, no wheezing today  Continue schedule nebs and mucinex   Code Status: full  Family Communication: patient , daughter over the phone on 2/21  Disposition Plan: likely will need snf   Consultants:  Critical care  Neurology  GI   cardiology  Procedures:  cortrak feeding tube placement  EEG  Intubation/extubation   Antibiotics:  none   Objective: BP (!) 188/92   Pulse 72   Temp 98.2 F (36.8 C) (Oral)   Resp (!) 24   Ht 5\' 9"  (1.753 m)   Wt 58.7 kg   SpO2 97%   BMI 19.11 kg/m   Intake/Output Summary (Last 24 hours) at 07/01/2018 1105 Last data filed at 07/01/2018 0446 Gross per 24 hour  Intake 1147 ml  Output 1140 ml  Net 7 ml   Filed Weights   06/26/18 0506 06/28/18 0340 06/29/18 0513  Weight: 62.4 kg 63.1 kg 58.7 kg    Exam: Patient is examined daily including today on 07/01/2018, exams remain the same as of yesterday except that has changed    General:  drowsy  Cardiovascular: RRR  Respiratory: no wheezing, improved aeration  Abdomen: Soft/ND/NT, positive BS  Musculoskeletal: No Edema, right aka  Neuro: drowsy  Data Reviewed: Basic Metabolic Panel: Recent Labs  Lab 06/25/18 0340 06/26/18 0313 06/27/18 0424 06/28/18 0537 06/29/18 0546 06/30/18 0340 07/01/18 0422  NA 147* 147* 149* 147* 150* 150* 143  K 3.6 3.8 4.5 4.1 3.7 4.4 4.0  CL 124* 123* 125* 121* 121* 121* 115*  CO2 15* 14* 12* 15* 19* 19* 19*  GLUCOSE 101* 121* 95 137* 106* 103* 105*  BUN 38* 47* 54* 59* 57* 55* 49*  CREATININE 3.50* 3.52* 3.24* 3.12* 3.25* 3.27* 3.18*  CALCIUM 7.6* 8.0* 8.3* 8.7* 8.6* 8.7* 8.3*  MG 2.2 2.0 2.1 2.0  --   --   --   PHOS 4.3 4.9* 4.8* 2.7  --   --   --    Liver Function Tests: Recent Labs  Lab 06/25/18 0340  AST 22  ALT 19  ALKPHOS 95  BILITOT 0.6  PROT 4.8*  ALBUMIN 1.8*   Recent Labs  Lab 06/29/18 0546  LIPASE 23   No results for input(s): AMMONIA in  the last 168 hours. CBC: Recent Labs  Lab 06/25/18 0340 06/26/18 0313 06/27/18 0424 06/28/18 0537 06/29/18 0546  WBC 8.8 8.9 11.0* 11.6*  --   NEUTROABS 6.2 6.7 8.9* 9.1*  --   HGB 7.6* 7.1* 7.2* 7.5* 7.9*  HCT 23.9* 22.3* 23.4* 24.0* 24.8*  MCV 78.6* 77.4* 78.3* 76.9*  --   PLT 166 195 242 290  --    Cardiac Enzymes:   Recent Labs  Lab 06/28/18 1209 06/28/18 1652 06/28/18 2316  TROPONINI 2.03* 2.10* 2.06*   BNP (last 3 results) No results for input(s): BNP in the last 8760 hours.  ProBNP (last 3 results) No results for input(s): PROBNP in the last 8760 hours.  CBG: Recent Labs  Lab 06/30/18 1137 06/30/18 1626 06/30/18 1952 07/01/18 0001 07/01/18 0740  GLUCAP 107* 115* 110* 104* 92    Recent Results (from the past 240 hour(s))  Culture, blood (routine x 2)     Status: None   Collection Time: 06/21/18  1:00 PM  Result Value Ref Range  Status   Specimen Description BLOOD LEFT ANTECUBITAL  Final   Special Requests   Final    BOTTLES DRAWN AEROBIC ONLY Blood Culture adequate volume   Culture   Final    NO GROWTH 5 DAYS Performed at [facility], [address] [place], [place] [address]    Report Status 06/26/2018 FINAL  Final  Culture, blood (routine x 2)     Status: None   Collection Time: 06/21/18  1:06 PM  Result Value Ref Range Status   Specimen Description BLOOD RIGHT HAND  Final   Special Requests   Final    BOTTLES DRAWN AEROBIC ONLY Blood Culture adequate volume   Culture   Final    NO GROWTH 5 DAYS Performed at [facility], [place] [address] [place], [place] [address]    Report Status 06/26/2018 FINAL  Final  Culture, respiratory (non-expectorated)     Status: None   Collection Time: 06/21/18  1:55 PM  Result Value Ref Range Status   Specimen Description TRACHEAL ASPIRATE  Final   Special Requests NONE  Final   Gram Stain   Final    ABUNDANT WBC PRESENT,BOTH PMN AND MONONUCLEAR ABUNDANT GRAM POSITIVE COCCI FEW GRAM VARIABLE  ROD    Culture   Final    ABUNDANT Consistent with normal respiratory flora. Performed at [facility], [place] [address] [place], [place] [address]    Report Status 06/23/2018 FINAL  Final     Studies: No results found.  Scheduled Meds: . sodium chloride   Intravenous Once  . allopurinol  200 mg Per Tube Daily  . amLODipine  10 mg Per Tube Daily  . atorvastatin  20 mg Per Tube q1800  . chlorhexidine  15 mL Mouth Rinse BID  . cloNIDine  0.2 mg Per Tube TID  . colchicine  0.3 mg Per Tube Daily  . feeding supplement (ENSURE ENLIVE)  237 mL Oral Q1500  . folic acid  1 mg Per Tube Daily  . free water  200 mL Per Tube Q8H  . [START ON 07/02/2018] gabapentin  100 mg Per Tube QHS  . guaiFENesin  5 mL Per Tube TID  . heparin  5,000 Units Subcutaneous Q8H  . hydrALAZINE  100 mg Per Tube TID  . ipratropium-albuterol  3 mL Nebulization TID  . isosorbide dinitrate  20 mg Per NG tube TID  . levETIRAcetam  500 mg Per Tube BID  . mouth rinse  15 mL Mouth Rinse q12n4p  . metoprolol tartrate  50 mg Oral BID  . OLANZapine  2.5 mg Per Tube QHS  . pantoprazole (PROTONIX) IV  40 mg Intravenous Q12H  . thiamine  100 mg Per Tube Daily    Continuous Infusions: . feeding supplement (VITAL HIGH PROTEIN) 1,000 mL (06/28/18 0434)  . nitroGLYCERIN Stopped (06/29/18 1130)     Time spent: 16mins,  I have personally reviewed and interpreted on  07/01/2018 daily labs, tele strips, imagings as discussed above under date review session and assessment and plans.  I reviewed all nursing notes, pharmacy notes, consultant notes,  vitals, pertinent old records  I have discussed plan of care as described above with RN , patient on 07/01/2018   Florencia Reasons MD, PhD  [facility] Pager [phone]. If 7PM-7AM, please contact night-coverage at [email or web address], password [facility] 07/01/2018, 11:05 AM  LOS: 10 days

## 2018-07-01 NOTE — Progress Notes (Addendum)
GI Rounding Note Covering for Bruce Ada, MD  07/01/2018, 11:54 AM  LOS: 10 days   SUBJECTIVE:   Chief complaint:      C/o feeling SOB this AM.  He is eating 50% to 100 % of the thickened full liquids with staff assisting feeding.  He expresses desire for food No abd pain.  No melena or hematochezia.  No nausea  OBJECTIVE:         Vital signs in last 24 hours:    Temp:  [97.8 F (36.6 C)-99.1 F (37.3 C)] 98.2 F (36.8 C) (02/23 1126) Pulse Rate:  [68-92] 72 (02/23 1126) Resp:  [22-37] 23 (02/23 1126) BP: (140-188)/(71-99) 140/71 (02/23 1126) SpO2:  [96 %-100 %] 99 % (02/23 1126) Last BM Date: 06/28/18 Filed Weights   06/26/18 0506 06/28/18 0340 06/29/18 0513  Weight: 62.4 kg 63.1 kg 58.7 kg   General: looks frail and ill  Heart: RRR, NSR on tele Chest: diminished BS with intermittent wheezing.  Shallow breathing.   Abdomen: soft, NT, ND, active BS  Extremities: s/p right LE amputation Neuro/Psych:  Somewhat drowsy but arouseable.  Laconic.    Intake/Output from previous day: 02/22 0701 - 02/23 0700 In: 1267 [P.O.:480; I.V.:387; NG/GT:400] Out: 1140 [Urine:1140]  Intake/Output this shift: No intake/output data recorded.  Lab Results: Recent Labs    06/29/18 0546  HGB 7.9*  HCT 24.8*   BMET Recent Labs    06/29/18 0546 06/30/18 0340 07/01/18 0422  NA 150* 150* 143  K 3.7 4.4 4.0  CL 121* 121* 115*  CO2 19* 19* 19*  GLUCOSE 106* 103* 105*  BUN 57* 55* 49*  CREATININE 3.25* 3.27* 3.18*  CALCIUM 8.6* 8.7* 8.3*    Studies/Results: No results found.   Scheduled Meds: . sodium chloride   Intravenous Once  . allopurinol  200 mg Per Tube Daily  . amLODipine  10 mg Per Tube Daily  . atorvastatin  20 mg Per Tube q1800  . chlorhexidine  15 mL Mouth Rinse BID  . cloNIDine  0.2 mg Per Tube TID  . colchicine  0.3 mg Per Tube Daily  . feeding supplement (ENSURE ENLIVE)  237 mL Oral Q1500  . folic acid  1  mg Per Tube Daily  . free water  200 mL Per Tube Q8H  . [START ON 07/02/2018] gabapentin  100 mg Per Tube QHS  . guaiFENesin  5 mL Per Tube TID  . heparin  5,000 Units Subcutaneous Q8H  . hydrALAZINE  100 mg Per Tube TID  . ipratropium-albuterol  3 mL Nebulization TID  . isosorbide dinitrate  20 mg Per NG tube TID  . levETIRAcetam  500 mg Per Tube BID  . mouth rinse  15 mL Mouth Rinse q12n4p  . metoprolol tartrate  50 mg Oral BID  . OLANZapine  2.5 mg Per Tube QHS  . pantoprazole (PROTONIX) IV  40 mg Intravenous Q12H  . thiamine  100 mg Per Tube Daily   Continuous Infusions: PRN Meds:.acetaminophen **OR** acetaminophen (TYLENOL) oral liquid 160 mg/5 mL **OR** acetaminophen, albuterol, loperamide HCl, LORazepam, morphine injection, nitroGLYCERIN, oxyCODONE-acetaminophen, RESOURCE THICKENUP CLEAR, senna-docusate   ASSESMENT:   *    *   FOBT positive anemia.  Microcytic. Hgb 7.1 >> 1 U PRBC >> 7.9 .  1 U PRBC on 2/20.   hgb last checked on 2/21.   No melena, BPR, CG/hematemesis.    *   Nonspecific, superficial gastritis per EGD 11/2017. Protonix IV BID  in place.  pepcid IV in place.    *    Elevated troponins.  Resolved chest pain. No heparin due to risk of bleeding and his anemia. Poor cardiac cath candidate due to anemia and CKD. Dr. Angelena Form suggests conservative, medical management.  *    Small, bilateral pleural effusions, nonspecific bibasilar lung opacities per CXR.  *    Dysphagia in setting of impaired cognition.  Severe aspiration risk per SLP 06/2015.  Recommended n.p.o. initially.  On 2/21 recommending nectar thick liquids/full liquid diet. Swallowing impacted by fluctuating mental status.    *     Stage 4 CKD.  Renal function slightly improved.  *   PRES (hypertensive encephalopathy), hypertensive emergency, seizure.  Required temporary intubation, now extubated.  Remains confused though oriented x 3.    *    Hypernatremia.   PLAN   *   No EGD today.  Dr.  Carol [name] will return tomorrow and make a decision regarding EGD.  *   Switch to oral Protonix.     [name]  07/01/2018, 11:54 AM Phone [phone]    Attending Physician Note   I have taken an interval history, reviewed the chart and examined the patient. I agree with the Advanced Practitioner's note, impression and recommendations.   Microcytic anemia and heme + stool.  Chest pain resolved. Elevated troponins.  Pulmonary wheezes persist.  PRES with persistently elevated BP however it is improved.   Dr. Benson Norway to resume care on Monday and decide about EGD.   Lucio Edward, MD FACG [phone]

## 2018-07-02 ENCOUNTER — Other Ambulatory Visit: Payer: Self-pay

## 2018-07-02 ENCOUNTER — Encounter (HOSPITAL_COMMUNITY)
Admission: EM | Disposition: A | Discharge status: Home Health | Payer: Self-pay | Source: Home / Self Care | Attending: Internal Medicine

## 2018-07-02 ENCOUNTER — Inpatient Hospital Stay (HOSPITAL_COMMUNITY): Payer: Medicare Other | Admitting: Anesthesiology

## 2018-07-02 ENCOUNTER — Encounter (HOSPITAL_COMMUNITY): Payer: Self-pay

## 2018-07-02 DIAGNOSIS — N184 Chronic kidney disease, stage 4 (severe): Secondary | ICD-10-CM

## 2018-07-02 DIAGNOSIS — I10 Essential (primary) hypertension: Secondary | ICD-10-CM

## 2018-07-02 HISTORY — PX: BIOPSY: SHX5522

## 2018-07-02 HISTORY — PX: ESOPHAGOGASTRODUODENOSCOPY (EGD) WITH PROPOFOL: SHX5813

## 2018-07-02 LAB — BASIC METABOLIC PANEL
Anion gap: 10 (ref 5–15)
BUN: 48 mg/dL — AB (ref 8–23)
CO2: 18 mmol/L — ABNORMAL LOW (ref 22–32)
Calcium: 8.1 mg/dL — ABNORMAL LOW (ref 8.9–10.3)
Chloride: 113 mmol/L — ABNORMAL HIGH (ref 98–111)
Creatinine, Ser: 3.24 mg/dL — ABNORMAL HIGH (ref 0.61–1.24)
GFR calc Af Amer: 21 mL/min — ABNORMAL LOW (ref >60)
GFR calc non Af Amer: 18 mL/min — ABNORMAL LOW (ref >60)
Glucose, Bld: 98 mg/dL (ref 70–99)
Potassium: 4.1 mmol/L (ref 3.5–5.1)
Sodium: 141 mmol/L (ref 135–145)

## 2018-07-02 LAB — POCT I-STAT 4, (NA,K, GLUC, HGB,HCT)
Glucose, Bld: 94 mg/dL (ref 70–99)
HCT: 24 % — ABNORMAL LOW (ref 39.0–52.0)
Hemoglobin: 8.2 g/dL — ABNORMAL LOW (ref 13.0–17.0)
Potassium: 4.7 mmol/L (ref 3.5–5.1)
Sodium: 143 mmol/L (ref 135–145)

## 2018-07-02 LAB — BPAM RBC
BLOOD PRODUCT EXPIRATION DATE: 202003192359
Blood Product Expiration Date: 202003192359
ISSUE DATE / TIME: 202002202317
Unit Type and Rh: 5100
Unit Type and Rh: 5100

## 2018-07-02 LAB — TYPE AND SCREEN
ABO/RH(D): O POS
Antibody Screen: NEGATIVE
UNIT DIVISION: 0
Unit division: 0

## 2018-07-02 LAB — GLUCOSE, CAPILLARY
GLUCOSE-CAPILLARY: 136 mg/dL — AB (ref 70–99)
Glucose-Capillary: 86 mg/dL (ref 70–99)
Glucose-Capillary: 93 mg/dL (ref 70–99)
Glucose-Capillary: 99 mg/dL (ref 70–99)

## 2018-07-02 LAB — MAGNESIUM: MAGNESIUM: 1.7 mg/dL (ref 1.7–2.4)

## 2018-07-02 SURGERY — ESOPHAGOGASTRODUODENOSCOPY (EGD) WITH PROPOFOL
Anesthesia: Monitor Anesthesia Care

## 2018-07-02 MED ORDER — LACTATED RINGERS IV SOLN
INTRAVENOUS | Status: DC
  Administered 2018-07-02: 14:00:00 via INTRAVENOUS

## 2018-07-02 MED ORDER — IPRATROPIUM-ALBUTEROL 0.5-2.5 (3) MG/3ML IN SOLN: 3.0000 mL | Freq: Four times a day (QID) | RESPIRATORY_TRACT | Status: DC | PRN

## 2018-07-02 MED ORDER — PROPOFOL 500 MG/50ML IV EMUL
INTRAVENOUS | Status: DC | PRN
  Administered 2018-07-02: 100 ug/kg/min via INTRAVENOUS

## 2018-07-02 MED ORDER — ASPIRIN EC 81 MG PO TBEC
81.0000 mg | DELAYED_RELEASE_TABLET | Freq: Every day | ORAL | Status: DC
  Administered 2018-07-03 – 2018-07-04 (×2): 81 mg via ORAL
  Filled 2018-07-02 (×2): qty 1

## 2018-07-02 MED ORDER — NICOTINE 21 MG/24HR TD PT24
21.0000 mg | MEDICATED_PATCH | Freq: Every day | TRANSDERMAL | Status: DC
  Administered 2018-07-02 – 2018-07-05 (×4): 21 mg via TRANSDERMAL
  Filled 2018-07-02 (×4): qty 1

## 2018-07-02 MED ORDER — ISOSORBIDE DINITRATE 10 MG PO TABS
40.0000 mg | ORAL_TABLET | Freq: Three times a day (TID) | ORAL | Status: DC
  Administered 2018-07-02 (×3): 40 mg via NASOGASTRIC
  Filled 2018-07-02 (×3): qty 4

## 2018-07-02 MED ORDER — CARVEDILOL 12.5 MG PO TABS
12.5000 mg | ORAL_TABLET | Freq: Two times a day (BID) | ORAL | Status: DC
  Administered 2018-07-02 – 2018-07-03 (×2): 12.5 mg via ORAL
  Filled 2018-07-02 (×2): qty 1

## 2018-07-02 MED ORDER — PROPOFOL 10 MG/ML IV BOLUS
INTRAVENOUS | Status: DC | PRN
  Administered 2018-07-02: 30 mg via INTRAVENOUS
  Administered 2018-07-02: 20 mg via INTRAVENOUS

## 2018-07-02 MED ORDER — MAGNESIUM SULFATE IN D5W 1-5 GM/100ML-% IV SOLN
1.0000 g | Freq: Once | INTRAVENOUS | Status: AC
  Administered 2018-07-02: 1 g via INTRAVENOUS
  Filled 2018-07-02: qty 100

## 2018-07-02 SURGICAL SUPPLY — 15 items

## 2018-07-02 NOTE — Progress Notes (Signed)
CSW called and left a message for the patients daughter to complete the assessment.   CSW will continue to follow.   Domenic Schwab, MSW, [place]

## 2018-07-02 NOTE — Anesthesia Preprocedure Evaluation (Signed)
Anesthesia Evaluation  Patient identified by MRN, date of birth, ID band Patient awake    Reviewed: Allergy & Precautions, H&P , NPO status , Patient's Chart, lab work & pertinent test results, reviewed documented beta blocker date and time   History of Anesthesia Complications Negative for: history of anesthetic complications  Airway Mallampati: II  TM Distance: >3 FB Neck ROM: full    Dental  (+) Teeth Intact, Dental Advisory Given   Pulmonary Current Smoker,    breath sounds clear to auscultation       Cardiovascular hypertension, Pt. on medications + Past MI   Rhythm:Regular  Admitted with PRES   Neuro/Psych Seizures -,  PSYCHIATRIC DISORDERS Anxiety Depression CVA    GI/Hepatic Neg liver ROS, GERD  Medicated and Controlled,Pt states he has reflux due to pancreatitis   Endo/Other  negative endocrine ROS  Renal/GU CRFRenal disease     Musculoskeletal  (+) Arthritis ,   Abdominal   Peds  Hematology  (+) Blood dyscrasia, anemia , H/o leukemia   Anesthesia Other Findings   Reproductive/Obstetrics                             Anesthesia Physical Anesthesia Plan  ASA: III  Anesthesia Plan: MAC   Post-op Pain Management:    Induction:   PONV Risk Score and Plan: 0 and Treatment may vary due to age or medical condition and Propofol infusion  Airway Management Planned: Nasal Cannula  Additional Equipment:   Intra-op Plan:   Post-operative Plan:   Informed Consent: I have reviewed the patients History and Physical, chart, labs and discussed the procedure including the risks, benefits and alternatives for the proposed anesthesia with the patient or authorized representative who has indicated his/her understanding and acceptance.     Dental advisory given  Plan Discussed with: CRNA and Surgeon  Anesthesia Plan Comments:         Anesthesia Quick Evaluation
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
## 2018-07-02 NOTE — Progress Notes (Signed)
While pt in pre-procedure in Endo he had a 9 beat run of V Tach. Pt asymptomatic. Strip printed and placed in chart and Xu MD (Internal Med.) Made aware.
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
## 2018-07-02 NOTE — Anesthesia Procedure Notes (Signed)
Procedure Name: MAC Date/Time: 07/02/2018 3:33 PM Performed by: Candis Shine, CRNA Pre-anesthesia Checklist: Patient identified, Emergency Drugs available, Suction available, Patient being monitored and Timeout performed Patient Re-evaluated:Patient Re-evaluated prior to induction Oxygen Delivery Method: Nasal cannula Dental Injury: Teeth and Oropharynx as per pre-operative assessment

## 2018-07-02 NOTE — Progress Notes (Addendum)
Progress Note  Patient Name: [name] Date of Encounter: 07/02/2018  Primary Cardiologist: Candee Furbish, MD   Subjective   No significant overnight events. Patient reports mild chest pain that he describes as an "empty" feeling and ranks as a 2/10 on the pain scale. No shortness of breath. Patient states he is hungry. He has no other complaints at this time.  Inpatient Medications    Scheduled Meds: . sodium chloride   Intravenous Once  . allopurinol  200 mg Per Tube Daily  . amLODipine  10 mg Per Tube Daily  . atorvastatin  20 mg Per Tube q1800  . chlorhexidine  15 mL Mouth Rinse BID  . cloNIDine  0.1 mg Per Tube TID  . colchicine  0.3 mg Per Tube Daily  . feeding supplement (ENSURE ENLIVE)  237 mL Oral Q1500  . folic acid  1 mg Per Tube Daily  . free water  200 mL Per Tube Q8H  . gabapentin  100 mg Per Tube QHS  . guaiFENesin  5 mL Per Tube TID  . hydrALAZINE  100 mg Per Tube TID  . ipratropium-albuterol  3 mL Nebulization TID  . isosorbide dinitrate  20 mg Per NG tube TID  . levETIRAcetam  500 mg Per Tube BID  . mouth rinse  15 mL Mouth Rinse q12n4p  . metoprolol tartrate  50 mg Oral BID  . OLANZapine  2.5 mg Per Tube QHS  . pantoprazole  40 mg Oral Q0600  . thiamine  100 mg Per Tube Daily   Continuous Infusions:  PRN Meds: acetaminophen **OR** acetaminophen (TYLENOL) oral liquid 160 mg/5 mL **OR** acetaminophen, albuterol, loperamide HCl, LORazepam, morphine injection, nitroGLYCERIN, oxyCODONE-acetaminophen, RESOURCE THICKENUP CLEAR, senna-docusate   Vital Signs    Vitals:   07/01/18 1623 07/01/18 1930 07/01/18 2018 07/02/18 0313  BP: (!) 165/77  (!) 152/80 (!) 161/80  Pulse:   68 62  Resp:   14 20  Temp:   98.3 F (36.8 C) 98.4 F (36.9 C)  TempSrc:   Axillary Axillary  SpO2:  97% 98% 94%  Weight:      Height:        Intake/Output Summary (Last 24 hours) at 07/02/2018 0856 Last data filed at 07/01/2018 1845 Gross per 24 hour  Intake 939 ml  Output  625 ml  Net 314 ml   Last 3 Weights 06/29/2018 06/28/2018 06/26/2018  Weight (lbs) 129 lb 6.6 oz 139 lb 1.8 oz 137 lb 9.1 oz  Weight (kg) 58.7 kg 63.1 kg 62.4 kg      Telemetry    Sinus rhythm with heart rates in the 60's to 90's with multiple PVCs. - Personally Reviewed  ECG    No new ECG tracing today. - Personally Reviewed  Physical Exam   GEN: African-American male resting comfortably. No acute distress.   Neck: Supple. Cardiac: RRR. No murmurs, rubs, or gallops.  Respiratory: Clear to auscultation bilaterally. GI: Soft, nontender, non-distended. Bowel sounds present. MS: No lower extremity edema. S/p right above the knee amputation.  Neuro:  No focal deficit. Psych: Drowsy.  Labs    Chemistry Recent Labs  Lab 06/30/18 0340 07/01/18 0422 07/02/18 0430  NA 150* 143 141  K 4.4 4.0 4.1  CL 121* 115* 113*  CO2 19* 19* 18*  GLUCOSE 103* 105* 98  BUN 55* 49* 48*  CREATININE 3.27* 3.18* 3.24*  CALCIUM 8.7* 8.3* 8.1*  GFRNONAA 18* 18* 18*  GFRAA 21* 21* 21*  ANIONGAP 10 9  10     Hematology Recent Labs  Lab 06/26/18 0313 06/27/18 0424 06/28/18 0537 06/29/18 0546  WBC 8.9 11.0* 11.6*  --   RBC 2.88* 2.99* 3.12*  --   HGB 7.1* 7.2* 7.5* 7.9*  HCT 22.3* 23.4* 24.0* 24.8*  MCV 77.4* 78.3* 76.9*  --   MCH 24.7* 24.1* 24.0*  --   MCHC 31.8 30.8 31.3  --   RDW 18.0* 18.4* 18.2*  --   PLT 195 242 290  --     Cardiac Enzymes Recent Labs  Lab 06/28/18 1209 06/28/18 1652 06/28/18 2316  TROPONINI 2.03* 2.10* 2.06*   No results for input(s): TROPIPOC in the last 168 hours.   BNPNo results for input(s): BNP, PROBNP in the last 168 hours.   DDimer No results for input(s): DDIMER in the last 168 hours.   Radiology    No results found.  Cardiac Studies   Echocardiogram 06/21/2018: Impressions:  1. The left ventricle has normal systolic function with an ejection fraction of 60-65%. The cavity size was normal. There is severely increased left ventricular  wall thickness. Left ventricular diastolic Doppler parameters are consistent with impaired  relaxation.  2. The right ventricle has normal systolic function. The cavity was normal. There is no increase in right ventricular wall thickness.  3. The mitral valve is normal in structure. There is mild thickening.  4. The tricuspid valve is normal in structure.  5. The aortic valve is tricuspid There is mild thickening of the aortic valve.  6. The pulmonic valve was normal in structure.  7. Normal LV function; mild diastolic dysfunction; severe LVH.  8. Right atrial pressure is estimated at 8 mmHg.  Patient Profile   [name] is a 73 y.o. male with a history of long standing hypertension, chronic kidney disease, HLD, leukemia, and ongoing tobacco abuse admitted with altered mental status due to accelerated HTN and PRES found on MRI. He was intubated initially. He is now extubated. He reports remote MI but no recent cardiac catheterization. Negative stress test in 2018. Echo 06/21/18 with normal LV systolic function with severe LVH. He has been complaining of chest pain since being extubated. First troponin 2.0 on 06/28/18. There are no other troponin levels during this hospital stay. He is anemic with Hgb 7.5 on 06/28/18. Creatinine is over 3.0. Reportedly had improvement of chest pain with NTG.   Assessment & Plan    Chest Pain with Elevated Troponin  - Last EKG on 06/28/2018 showed T wave inversion in inferior and lateral leads in setting of LVH.  - Troponin elevated and flat at 2.03 >> 2.10 >> 2.06.  - Patient reports only mild chest pain this morning that he describes as an empty feeling and ranks as a 2/10.  - Hemoglobin 7.5. GI following. - Serum creatinine 3.24.  - Will continue to titrate medications. Will change Lopressor to Coreg 12.5mg  twice daily for further BP control. Will increase Isordil to 40mg  three times daily.  - No plans for any ischemic workup at this time.  Hypertension - BP  elevated at 161/80. - Continue Amlodipine 10mg  daily. - Continue Clonidine 0.1mg  three times daily. - Continue Hydralazine 100mg  three time daily. - Will discontinue Lopressor and start Coreg 12.5mg  twice daily. - Will increase Isordil to 40mg  three times daily.  Otherwise, per primary team.  For questions or updates, please contact [place] Please consult [email or web address] for contact info under        Signed, Darreld Mclean,  PA-C  07/02/2018, 8:56 AM    I have seen and examined the patient along with Darreld Mclean, PA-C .  I have reviewed the chart, notes and new data.  I agree with PA's note.  Key new complaints: minimal chest pain. Sleepy, easy to awake and appears to be oriented. Wants a bacon and cheese sandwich. Key examination changes: appears very weak, RRR, JVP 4-6 cm, otherwise normal CV exam. BP remains high. Key new findings / data: stable hemoglobin, creatinine and moderate "plateau" troponin elevation  PLAN: Switch metoprolol to carvedilol and increase isosorbide. No plan for invasive coronary evaluation.  Sanda Klein, MD, [place] [phone] 07/02/2018, 9:45 AM

## 2018-07-02 NOTE — Progress Notes (Addendum)
PROGRESS NOTE  [name] PPI:[id number] DOB: 1946-02-04 DOA: 06/20/2018 PCP: [name], MD  Brief summary:  73 y/o male admitted with possible seizure in setting of PRES, extubated on 2/16 Transferred to [facility] on 2/19 developed chest pain, htn, troponin elevation on 2/20, cardiology consulted Has FOBT + ,GI consulted  HPI/Recap of past 24 hours:   Npo , awaiting for EGD today, he is more alert and interactive this am,  He did not require any prn agitation meds last 48hr bp remain elevated, He  denies pain, no hypoxia on room air, no fever Condom cath in place with clear urine cortrak in place    Assessment/Plan: Active Problems:   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[place])   Essential hypertension   Acute respiratory failure with hypoxemia ([place])   Acute metabolic encephalopathy   Chronic pancreatitis ([place])   Idiopathic chronic gout of multiple sites without tophus   Stroke ([place])   CKD (chronic kidney disease), stage III (HCC)   Seizure ([place])   Status epilepticus ([place])   Malnutrition of moderate degree   CKD (chronic kidney disease), stage IV (HCC)   Chest pain of uncertain etiology   Troponin level elevated   Occult blood in stools   Microcytic anemia  Hypertensive emergency/PRES (presenting symptom) -he required airway protection, extubated on 2/16 -he is off  Cleviprex,  -he was started on nitrodrip due to chest pain and elevated blood pressure on 2/20,  bp is improving, he denies chest pain, now off nitrodrip, started on isosobie tid and titrate up as tolerated -continue norvasc, clonidine, hydralazine , coreg, imdur -bp remain elevated, continue to increase imdur, continue to decdrease clonidine due to drowsiness.  (drowsiness is improving) Today he told me he was not taking his blood pressure meds at home.  Possible seizure -he is off precedex drip, off propofol drip, currently on keppra  delirium -started low dose neurontin bid and night time zyprexa for  delirium/agitation  -try to avoid prn ativan -improving in the last 24-48hrs, did not require prn ativan, appear more alert, off mittens this am, plan to start therapy after egd   Chest pain, troponin elevation ( on 2/20): -patient has severe chest pain, elevated blood pressure on 2/20, troponin at 2 -cardiology consulted, he is started on nitrodrip, chest pain has resolved,  -gi consulted to evaluate risk of bleeding , currently not on asa or heparin -bp control, continue betablocker, statin, cardiology/gi input appreciated, do not plan for invasive testing   Addendum 3:40pm on 2/24 NSVT: Per RN from endo, patient has NSVTx8 beats , he was asymptomatic, bp stable Continue betablocker, keep mag>2, k>4, cardiology following  Anemia: hgb 7.2, received prbc x1 on 2/20 FOBT + GI consulted , EGD planned, will follow gi recommendation Hold heparin, start scd's   Hypernatremia: Sodium peaked at 150, normalized after  free water flushes per tube, change back  to q8hrs , repeat bmp  Possible alcohol abuse, h/p chronic pancreatitis -daughter reports patient does not drink daily, patient does binge drinking when he feels depressed, daughter states he only does binge drinking for a few days in a row, it only happens once a year > thiamine, folate   Dysphagia: S/p MBS  SLP Diet Recommendations: Nectar thick liquid;Other (Comment)(full liquid diet)   Liquid Administration via: Straw   Medication Administration: Crushed with puree   Supervision: Staff to assist with self feeding;Full supervision/cueing for compensatory strategies   Compensations: Minimize environmental distractions;Slow rate;Small sips/bites;Follow solids with liquid   Postural Changes:  Seated upright at 90 degrees   Oral Care Recommendations: Oral care BID   Other Recommendations: Order thickener from pharmacy;Prohibited food (jello, ice cream, thin soups);Remove water pitcher;Have oral suction  available  Progressing slowly with speech due to fluctuating mental status, he appear improving, hopefully can remove cortrak in 24-48hrs  CKDIV Cr at baseline Renal dosing meds    Chronic leukemia (CML); Under the care of Dr. [name]. On bosutinib  Gout; On allopurinol/colchicine  Non-severe (moderate) malnutrition in context of chronic illness Nutrition input appreciated  Smoker/one pack a day -Mild centrilobular emphysema on CT chest from 02/2017 COPD,  Diffuse wheezing on 2/20 and 2/21, no wheezing today  Continue schedule nebs and mucinex   Code Status: full  Family Communication: patient , daughter over the phone on 2/21  Disposition Plan: likely will need snf   Consultants:  Critical care  Neurology  GI   cardiology  Procedures:  cortrak feeding tube placement  EEG  Intubation/extubation   EGD on 2/24  Antibiotics:  none   Objective: BP (!) 172/62   Pulse 62   Temp 98.4 F (36.9 C) (Axillary)   Resp 20   Ht 5\' 9"  (1.753 m)   Wt 58.7 kg   SpO2 94%   BMI 19.11 kg/m   Intake/Output Summary (Last 24 hours) at 07/02/2018 1032 Last data filed at 07/01/2018 1845 Gross per 24 hour  Intake 821 ml  Output 625 ml  Net 196 ml   Filed Weights   06/26/18 0506 06/28/18 0340 06/29/18 0513  Weight: 62.4 kg 63.1 kg 58.7 kg    Exam: Patient is examined daily including today on 07/02/2018, exams remain the same as of yesterday except that has changed    General:  More alert, slight confusion , states it is 2001, he is oriented to the months, place and situation   Cardiovascular: RRR  Respiratory: no wheezing, improved aeration  Abdomen: Soft/ND/NT, positive BS  Musculoskeletal: No Edema, right aka  Neuro: drowsy  Data Reviewed: Basic Metabolic Panel: Recent Labs  Lab 06/26/18 0313 06/27/18 0424 06/28/18 0537 06/29/18 0546 06/30/18 0340 07/01/18 0422 07/02/18 0430  NA 147* 149* 147* 150* 150* 143 141  K 3.8 4.5 4.1 3.7 4.4  4.0 4.1  CL 123* 125* 121* 121* 121* 115* 113*  CO2 14* 12* 15* 19* 19* 19* 18*  GLUCOSE 121* 95 137* 106* 103* 105* 98  BUN 47* 54* 59* 57* 55* 49* 48*  CREATININE 3.52* 3.24* 3.12* 3.25* 3.27* 3.18* 3.24*  CALCIUM 8.0* 8.3* 8.7* 8.6* 8.7* 8.3* 8.1*  MG 2.0 2.1 2.0  --   --   --  1.7  PHOS 4.9* 4.8* 2.7  --   --   --   --    Liver Function Tests: No results for input(s): AST, ALT, ALKPHOS, BILITOT, PROT, ALBUMIN in the last 168 hours. Recent Labs  Lab 06/29/18 0546  LIPASE 23   No results for input(s): AMMONIA in the last 168 hours. CBC: Recent Labs  Lab 06/26/18 0313 06/27/18 0424 06/28/18 0537 06/29/18 0546  WBC 8.9 11.0* 11.6*  --   NEUTROABS 6.7 8.9* 9.1*  --   HGB 7.1* 7.2* 7.5* 7.9*  HCT 22.3* 23.4* 24.0* 24.8*  MCV 77.4* 78.3* 76.9*  --   PLT 195 242 290  --    Cardiac Enzymes:   Recent Labs  Lab 06/28/18 1209 06/28/18 1652 06/28/18 2316  TROPONINI 2.03* 2.10* 2.06*   BNP (last 3 results) No results for input(s): BNP  in the last 8760 hours.  ProBNP (last 3 results) No results for input(s): PROBNP in the last 8760 hours.  CBG: Recent Labs  Lab 07/01/18 1125 07/01/18 1616 07/01/18 2016 07/02/18 0006 07/02/18 0749  GLUCAP 120* 101* 97 99 86    No results found for this or any previous visit (from the past 240 hour(s)).   Studies: No results found.  Scheduled Meds: . sodium chloride   Intravenous Once  . allopurinol  200 mg Per Tube Daily  . amLODipine  10 mg Per Tube Daily  . atorvastatin  20 mg Per Tube q1800  . carvedilol  12.5 mg Oral BID WC  . chlorhexidine  15 mL Mouth Rinse BID  . cloNIDine  0.1 mg Per Tube TID  . colchicine  0.3 mg Per Tube Daily  . feeding supplement (ENSURE ENLIVE)  237 mL Oral Q1500  . folic acid  1 mg Per Tube Daily  . free water  200 mL Per Tube Q8H  . gabapentin  100 mg Per Tube QHS  . guaiFENesin  5 mL Per Tube TID  . hydrALAZINE  100 mg Per Tube TID  . ipratropium-albuterol  3 mL Nebulization TID  .  isosorbide dinitrate  40 mg Per NG tube TID  . levETIRAcetam  500 mg Per Tube BID  . mouth rinse  15 mL Mouth Rinse q12n4p  . OLANZapine  2.5 mg Per Tube QHS  . pantoprazole  40 mg Oral Q0600  . thiamine  100 mg Per Tube Daily    Continuous Infusions:    Time spent: 51mins, case discussed with GI I have personally reviewed and interpreted on  07/02/2018 daily labs, tele strips, imagings as discussed above under date review session and assessment and plans.  I reviewed all nursing notes, pharmacy notes, consultant notes,  vitals, pertinent old records  I have discussed plan of care as described above with RN , patient on 07/02/2018   Florencia Reasons MD, PhD  [facility] Pager [phone]. If 7PM-7AM, please contact night-coverage at [email or web address], password [facility] 07/02/2018, 10:32 AM  LOS: 11 days

## 2018-07-02 NOTE — Transfer of Care (Signed)
Immediate Anesthesia Transfer of Care Note  Patient: [name]  Procedure(s) Performed: ESOPHAGOGASTRODUODENOSCOPY (EGD) WITH PROPOFOL (N/A ) BIOPSY  Patient Location: Endoscopy Unit  Anesthesia Type:MAC  Level of Consciousness: awake, alert  and oriented  Airway & Oxygen Therapy: Patient Spontanous Breathing and Patient connected to nasal cannula oxygen  Post-op Assessment: Report given to RN and Post -op Vital signs reviewed and stable  Post vital signs: Reviewed and stable  Last Vitals:  Vitals Value Taken Time  BP    Temp    Pulse    Resp    SpO2      Last Pain:  Vitals:   07/02/18 1406  TempSrc: Oral  PainSc: 0-No pain      Patients Stated Pain Goal: 0 (91/63/84 6659)  Complications: No apparent anesthesia complications

## 2018-07-02 NOTE — Progress Notes (Signed)
Venipuncture performed for Istat per Dr. Collene Mares.

## 2018-07-02 NOTE — Progress Notes (Signed)
SLP Cancellation Note  Patient Details Name: [name] MRN: [id number] DOB: 1945/10/03   Cancelled treatment:       Reason Eval/Treat Not Completed: Medical issues which prohibited therapy. Order received for repeat swallow evaluation. Per chart review, pt is currently NPO pending possible procedure. Will f/u for readiness to try advanced POs.   Venita Sheffield Kirbie Stodghill 07/02/2018, 9:01 AM  Germain Osgood Zailah Zagami, M.A. [place] Acute Environmental education officer [phone] Office [phone]

## 2018-07-02 NOTE — Op Note (Signed)
[facility] Patient Name: [name] Procedure Date : 07/02/2018 MRN: [id number] Attending MD: Juanita Craver , MD Date of Birth: [birth year] CSN: [id number] Age: 73 Admit Type: Inpatient Procedure:                EGD with antral biopsies. Indications:              Iron deficiency anemia, Heme positive stool. Providers:                Juanita Craver, MD, Raynelle Bring, RN, Elspeth Cho                            Technician, Dellie Catholic, CRNA Referring MD:             THP Medicines:                Monitored Anesthesia Care Complications:            No immediate complications. Estimated Blood Loss:     Estimated blood loss was minimal. Procedure:                Pre-anesthesia assessment: - Prior to the                            procedure, a history and physical was performed,                            and patient medications and allergies were                            reviewed. The patient's tolerance of previous                            anesthesia was also reviewed. The risks and                            benefits of the procedure and the sedation options                            and risks were discussed with the patient. All                            questions were answered, and informed consent was                            obtained. Prior Anticoagulants: The patient has                            taken no previous anticoagulant or antiplatelet                            agents. ASA Grade Assessment: III - A patient with                            severe systemic disease. After reviewing the risks  and benefits, the patient was deemed in                            satisfactory condition to undergo the procedure.                            After obtaining informed consent, the endoscope was                            passed under direct vision. Throughout the                            procedure, the patient's blood pressure, pulse, and                             oxygen saturations were monitored continuously. The                            [id number]-[id number] ([id number]) Olympus endoscope was introduced                            through the mouth, and advanced to the second part                            of duodenum. The EGD was performed with moderate                            difficulty due to the patient's combativeness.                            Successful completion of the procedure was aided by                            having anesthesiology staff obtain IV access. The                            patient tolerated the procedure well. Scope In: Scope Out: Findings:      The esophagus appeared somewhat atonic and the lumen of the esophagus       was moderately dilated.      A medium-sized hiatal hernia was noted on retroflexion.      Diffuse moderate inflammation was found in the entire examined       stomach-atrophic gastritis.      Edematous mucosa was found in the gastric antrum-biopsies were taken       with a cold forceps for histology.      The examined duodenum was normal.      No source of blood loss identified; no fresh or old heme noted on exam. Impression:               - Atonic appearing, moderately dilated esophagus.                           - Dobhoff tip noted in the midbody initially but  later on withdrawal of the scope it was in the                            mid-esopahgus                           - Medium-sized hiatal hernia.                           - Diffuse atrophic gastritis.                           - Edematous antral folds-biopsied.                           - Normal examined duodenum. Moderate Sedation:      Mac used. Recommendation:           - Full liquids today.                           - Continue present medications.                           - Dobhoff will be removed now.                           - Await pathology results. Procedure Code(s):        ---  Professional ---                           [phone], Esophagogastroduodenoscopy, flexible,                            transoral; with biopsy, single or multiple Diagnosis Code(s):        --- Professional ---                           D50.9, Iron deficiency anemia, unspecified                           R19.5, Other fecal abnormalities                           K44.9, Diaphragmatic hernia without obstruction or                            gangrene                           K29.70, Gastritis, unspecified, without bleeding CPT copyright 2018 American Medical Association. All rights reserved. The codes documented in this report are preliminary and upon coder review may  be revised to meet current compliance requirements. Juanita Craver, MD Juanita Craver, MD 07/02/2018 4:17:46 PM This report has been signed electronically. Number of Addenda: 0

## 2018-07-03 LAB — CBC WITH DIFFERENTIAL/PLATELET
Abs Immature Granulocytes: 0.1 10*3/uL — ABNORMAL HIGH (ref 0.00–0.07)
Basophils Absolute: 0 10*3/uL (ref 0.0–0.1)
Basophils Relative: 0 %
EOS PCT: 5 %
Eosinophils Absolute: 0.5 10*3/uL (ref 0.0–0.5)
HCT: 26.5 % — ABNORMAL LOW (ref 39.0–52.0)
Hemoglobin: 8.1 g/dL — ABNORMAL LOW (ref 13.0–17.0)
Immature Granulocytes: 1 %
LYMPHS PCT: 10 %
Lymphs Abs: 1 10*3/uL (ref 0.7–4.0)
MCH: 24.3 pg — ABNORMAL LOW (ref 26.0–34.0)
MCHC: 30.6 g/dL (ref 30.0–36.0)
MCV: 79.3 fL — ABNORMAL LOW (ref 80.0–100.0)
Monocytes Absolute: 1.2 10*3/uL — ABNORMAL HIGH (ref 0.1–1.0)
Monocytes Relative: 12 %
NEUTROS ABS: 7.1 10*3/uL (ref 1.7–7.7)
Neutrophils Relative %: 72 %
Platelets: 211 10*3/uL (ref 150–400)
RBC: 3.34 MIL/uL — ABNORMAL LOW (ref 4.22–5.81)
RDW: 17.5 % — ABNORMAL HIGH (ref 11.5–15.5)
WBC: 9.8 10*3/uL (ref 4.0–10.5)
nRBC: 0 % (ref 0.0–0.2)

## 2018-07-03 LAB — BASIC METABOLIC PANEL
Anion gap: 9 (ref 5–15)
BUN: 46 mg/dL — ABNORMAL HIGH (ref 8–23)
CALCIUM: 8.2 mg/dL — AB (ref 8.9–10.3)
CHLORIDE: 113 mmol/L — AB (ref 98–111)
CO2: 21 mmol/L — AB (ref 22–32)
CREATININE: 3.27 mg/dL — AB (ref 0.61–1.24)
GFR calc Af Amer: 21 mL/min — ABNORMAL LOW (ref >60)
GFR calc non Af Amer: 18 mL/min — ABNORMAL LOW (ref >60)
Glucose, Bld: 105 mg/dL — ABNORMAL HIGH (ref 70–99)
Potassium: 3.9 mmol/L (ref 3.5–5.1)
Sodium: 143 mmol/L (ref 135–145)

## 2018-07-03 LAB — GLUCOSE, CAPILLARY
GLUCOSE-CAPILLARY: 110 mg/dL — AB (ref 70–99)
Glucose-Capillary: 93 mg/dL (ref 70–99)
Glucose-Capillary: 111 mg/dL — ABNORMAL HIGH (ref 70–99)
Glucose-Capillary: 128 mg/dL — ABNORMAL HIGH (ref 70–99)
Glucose-Capillary: 107 mg/dL — ABNORMAL HIGH (ref 70–99)

## 2018-07-03 LAB — MAGNESIUM: Magnesium: 2 mg/dL (ref 1.7–2.4)

## 2018-07-03 MED ORDER — ATORVASTATIN CALCIUM 10 MG PO TABS
20.0000 mg | ORAL_TABLET | Freq: Every day | ORAL | Status: DC
  Administered 2018-07-03 – 2018-07-04 (×2): 20 mg via ORAL
  Filled 2018-07-03 (×2): qty 2

## 2018-07-03 MED ORDER — CALCIUM CARBONATE ANTACID 500 MG PO CHEW
1.0000 | CHEWABLE_TABLET | Freq: Once | ORAL | Status: AC
  Administered 2018-07-03: 200 mg via ORAL
  Filled 2018-07-03: qty 1

## 2018-07-03 MED ORDER — LEVETIRACETAM 100 MG/ML PO SOLN
500.0000 mg | Freq: Two times a day (BID) | ORAL | Status: DC
  Administered 2018-07-03 – 2018-07-05 (×5): 500 mg via ORAL
  Filled 2018-07-03 (×5): qty 5

## 2018-07-03 MED ORDER — CLONIDINE HCL 0.1 MG PO TABS
0.1000 mg | ORAL_TABLET | Freq: Three times a day (TID) | ORAL | Status: DC
  Administered 2018-07-03 – 2018-07-05 (×7): 0.1 mg via ORAL
  Filled 2018-07-03 (×7): qty 1

## 2018-07-03 MED ORDER — PANCRELIPASE (LIP-PROT-AMYL) 12000-38000 UNITS PO CPEP
12000.0000 [IU] | ORAL_CAPSULE | Freq: Three times a day (TID) | ORAL | Status: DC
  Administered 2018-07-04 – 2018-07-05 (×4): 12000 [IU] via ORAL
  Filled 2018-07-03 (×4): qty 1

## 2018-07-03 MED ORDER — PANTOPRAZOLE SODIUM 40 MG PO PACK
40.0000 mg | PACK | Freq: Two times a day (BID) | ORAL | Status: DC
  Administered 2018-07-03 – 2018-07-05 (×4): 40 mg via ORAL
  Filled 2018-07-03 (×4): qty 20

## 2018-07-03 MED ORDER — OLANZAPINE 5 MG PO TABS
2.5000 mg | ORAL_TABLET | Freq: Every day | ORAL | Status: DC
  Administered 2018-07-03 – 2018-07-04 (×2): 2.5 mg via ORAL
  Filled 2018-07-03 (×2): qty 0.5

## 2018-07-03 MED ORDER — ISOSORBIDE DINITRATE 10 MG PO TABS
40.0000 mg | ORAL_TABLET | Freq: Three times a day (TID) | ORAL | Status: DC
  Administered 2018-07-03 – 2018-07-05 (×7): 40 mg via ORAL
  Filled 2018-07-03 (×6): qty 4

## 2018-07-03 MED ORDER — FUROSEMIDE 10 MG/ML IJ SOLN
80.0000 mg | Freq: Once | INTRAMUSCULAR | Status: AC
  Administered 2018-07-03: 80 mg via INTRAVENOUS
  Filled 2018-07-03: qty 8

## 2018-07-03 MED ORDER — CARVEDILOL 25 MG PO TABS
25.0000 mg | ORAL_TABLET | Freq: Two times a day (BID) | ORAL | Status: DC
  Administered 2018-07-03 – 2018-07-04 (×2): 25 mg via ORAL
  Filled 2018-07-03 (×2): qty 1

## 2018-07-03 MED ORDER — COLCHICINE 0.6 MG PO TABS
0.3000 mg | ORAL_TABLET | Freq: Every day | ORAL | Status: DC
  Administered 2018-07-03 – 2018-07-05 (×3): 0.3 mg via ORAL
  Filled 2018-07-03 (×3): qty 0.5

## 2018-07-03 MED ORDER — FOLIC ACID 1 MG PO TABS
1.0000 mg | ORAL_TABLET | Freq: Every day | ORAL | Status: DC
  Administered 2018-07-03 – 2018-07-05 (×3): 1 mg via ORAL
  Filled 2018-07-03 (×3): qty 1

## 2018-07-03 MED ORDER — ALLOPURINOL 100 MG PO TABS
200.0000 mg | ORAL_TABLET | Freq: Every day | ORAL | Status: DC
  Administered 2018-07-03 – 2018-07-05 (×3): 200 mg via ORAL
  Filled 2018-07-03 (×3): qty 2

## 2018-07-03 MED ORDER — GABAPENTIN 250 MG/5ML PO SOLN
100.0000 mg | Freq: Every day | ORAL | Status: DC
  Administered 2018-07-03 – 2018-07-04 (×2): 100 mg via ORAL
  Filled 2018-07-03 (×2): qty 2

## 2018-07-03 MED ORDER — GUAIFENESIN 100 MG/5ML PO SOLN
5.0000 mL | Freq: Three times a day (TID) | ORAL | Status: DC
  Administered 2018-07-03 – 2018-07-05 (×5): 100 mg via ORAL
  Filled 2018-07-03: qty 5
  Filled 2018-07-03: qty 10
  Filled 2018-07-03 (×3): qty 5

## 2018-07-03 MED ORDER — HYDRALAZINE HCL 50 MG PO TABS
100.0000 mg | ORAL_TABLET | Freq: Three times a day (TID) | ORAL | Status: DC
  Administered 2018-07-03 – 2018-07-05 (×7): 100 mg via ORAL
  Filled 2018-07-03 (×8): qty 2

## 2018-07-03 MED ORDER — AMLODIPINE BESYLATE 10 MG PO TABS
10.0000 mg | ORAL_TABLET | Freq: Every day | ORAL | Status: DC
  Administered 2018-07-03 – 2018-07-05 (×3): 10 mg via ORAL
  Filled 2018-07-03 (×3): qty 1

## 2018-07-03 MED ORDER — VITAMIN B-1 100 MG PO TABS
100.0000 mg | ORAL_TABLET | Freq: Every day | ORAL | Status: DC
  Administered 2018-07-03 – 2018-07-05 (×3): 100 mg via ORAL
  Filled 2018-07-03 (×3): qty 1

## 2018-07-03 MED ORDER — PANTOPRAZOLE SODIUM 40 MG PO PACK
40.0000 mg | PACK | Freq: Every day | ORAL | Status: DC
  Filled 2018-07-03: qty 20

## 2018-07-03 MED ORDER — PANTOPRAZOLE SODIUM 40 MG PO PACK
40.0000 mg | PACK | Freq: Every day | ORAL | Status: DC
  Administered 2018-07-03: 40 mg via ORAL
  Filled 2018-07-03: qty 20
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
## 2018-07-03 NOTE — Plan of Care (Signed)
  Problem: Nutrition: Goal: Adequate nutrition will be maintained Outcome: Progressing Cortrack removed and pt tolerating full liquid diet with nectar thick liquids with out difficulty   Problem: Elimination: Goal: Will not experience complications related to urinary retention Outcome: Completed/Met Pt has external catheter and is voiding without difficulty

## 2018-07-03 NOTE — Anesthesia Postprocedure Evaluation (Signed)
Anesthesia Post Note  Patient: [name]  Procedure(s) Performed: ESOPHAGOGASTRODUODENOSCOPY (EGD) WITH PROPOFOL (N/A ) BIOPSY     Patient location during evaluation: Endoscopy Anesthesia Type: MAC Level of consciousness: awake and alert Pain management: pain level controlled Vital Signs Assessment: post-procedure vital signs reviewed and stable Respiratory status: spontaneous breathing, nonlabored ventilation, respiratory function stable and patient connected to nasal cannula oxygen Cardiovascular status: stable and blood pressure returned to baseline Postop Assessment: no apparent nausea or vomiting Anesthetic complications: no    Last Vitals:  Vitals:   07/03/18 1619 07/03/18 2030  BP: (!) 153/77 (!) 146/90  Pulse: 87 84  Resp: (!) 28 20  Temp:  37.8 C  SpO2:  99%    Last Pain:  Vitals:   07/03/18 2030  TempSrc: Oral  PainSc:                  Attikus Bartoszek
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
## 2018-07-03 NOTE — Progress Notes (Signed)
Progress Note  Patient Name: [name] Date of Encounter: 07/03/2018  Primary Cardiologist: Candee Furbish, MD   Subjective   Remains very sleepy, but easy to awake.  He did receive lorazepam last night.   Denies chest pain.  Denies dyspnea. Blood pressure remains elevated despite the changes made yesterday. Had EGD yesterday which showed a hiatal hernia and a rather atonic esophagus, but no source of bleeding.  Hemoglobin remained stable. Reportedly an 8 beat run of nonsustained VT occurred during his endoscopy, unable to review strips.  Inpatient Medications    Scheduled Meds: . sodium chloride   Intravenous Once  . allopurinol  200 mg Oral Daily  . amLODipine  10 mg Oral Daily  . aspirin EC  81 mg Oral Daily  . atorvastatin  20 mg Oral q1800  . carvedilol  12.5 mg Oral BID WC  . chlorhexidine  15 mL Mouth Rinse BID  . cloNIDine  0.1 mg Oral TID  . colchicine  0.3 mg Oral Daily  . feeding supplement (ENSURE ENLIVE)  237 mL Oral Q1500  . folic acid  1 mg Oral Daily  . gabapentin  100 mg Oral QHS  . guaiFENesin  5 mL Oral TID  . hydrALAZINE  100 mg Oral TID  . isosorbide dinitrate  40 mg Oral TID  . levETIRAcetam  500 mg Oral BID  . mouth rinse  15 mL Mouth Rinse q12n4p  . nicotine  21 mg Transdermal Daily  . OLANZapine  2.5 mg Oral QHS  . pantoprazole sodium  40 mg Oral Daily  . thiamine  100 mg Oral Daily   Continuous Infusions:  PRN Meds: acetaminophen **OR** acetaminophen (TYLENOL) oral liquid 160 mg/5 mL **OR** acetaminophen, albuterol, ipratropium-albuterol, loperamide HCl, LORazepam, morphine injection, nitroGLYCERIN, oxyCODONE-acetaminophen, RESOURCE THICKENUP CLEAR, senna-docusate   Vital Signs    Vitals:   07/02/18 1624 07/02/18 2022 07/03/18 0425 07/03/18 0849  BP: (!) 153/100 (!) 153/83 (!) 181/93 (!) 191/90  Pulse: 75 79 92 80  Resp: 18 18 (!) 28   Temp:  98.5 F (36.9 C) 99.1 F (37.3 C)   TempSrc:  Oral Oral   SpO2: 97% 98% 99% 99%  Weight:    57.1 kg   Height:        Intake/Output Summary (Last 24 hours) at 07/03/2018 0855 Last data filed at 07/03/2018 0845 Gross per 24 hour  Intake 900 ml  Output 1975 ml  Net -1075 ml   Last 3 Weights 07/03/2018 06/29/2018 06/28/2018  Weight (lbs) 125 lb 14.1 oz 129 lb 6.6 oz 139 lb 1.8 oz  Weight (kg) 57.1 kg 58.7 kg 63.1 kg      Telemetry    Sinus rhythm- Personally Reviewed  ECG    No new tracings- Personally Reviewed  Physical Exam  Asleep but can be awoken and appears to be oriented GEN: No acute distress.   Neck: No JVD Cardiac: RRR, no murmurs, rubs, or gallops.  Respiratory: Clear to auscultation bilaterally. GI: Soft, nontender, non-distended  MS: No edema; No deformity. Neuro:  Nonfocal  Psych: Normal affect   Labs    Chemistry Recent Labs  Lab 07/01/18 0422 07/02/18 0430 07/02/18 1527 07/03/18 0310  NA 143 141 143 143  K 4.0 4.1 4.7 3.9  CL 115* 113*  --  113*  CO2 19* 18*  --  21*  GLUCOSE 105* 98 94 105*  BUN 49* 48*  --  46*  CREATININE 3.18* 3.24*  --  3.27*  CALCIUM  8.3* 8.1*  --  8.2*  GFRNONAA 18* 18*  --  18*  GFRAA 21* 21*  --  21*  ANIONGAP 9 10  --  9     Hematology Recent Labs  Lab 06/27/18 0424 06/28/18 0537 06/29/18 0546 07/02/18 1527 07/03/18 0310  WBC 11.0* 11.6*  --   --  9.8  RBC 2.99* 3.12*  --   --  3.34*  HGB 7.2* 7.5* 7.9* 8.2* 8.1*  HCT 23.4* 24.0* 24.8* 24.0* 26.5*  MCV 78.3* 76.9*  --   --  79.3*  MCH 24.1* 24.0*  --   --  24.3*  MCHC 30.8 31.3  --   --  30.6  RDW 18.4* 18.2*  --   --  17.5*  PLT 242 290  --   --  211    Cardiac Enzymes Recent Labs  Lab 06/28/18 1209 06/28/18 1652 06/28/18 2316  TROPONINI 2.03* 2.10* 2.06*   No results for input(s): TROPIPOC in the last 168 hours.   BNPNo results for input(s): BNP, PROBNP in the last 168 hours.   DDimer No results for input(s): DDIMER in the last 168 hours.   Radiology    No results found.  Cardiac Studies   Echocardiogram  06/21/2018: Impressions: 1. The left ventricle has normal systolic function with an ejection fraction of 60-65%. The cavity size was normal. There is severely increased left ventricular wall thickness. Left ventricular diastolic Doppler parameters are consistent with impaired  relaxation. 2. The right ventricle has normal systolic function. The cavity was normal. There is no increase in right ventricular wall thickness. 3. The mitral valve is normal in structure. There is mild thickening. 4. The tricuspid valve is normal in structure. 5. The aortic valve is tricuspid There is mild thickening of the aortic valve. 6. The pulmonic valve was normal in structure. 7. Normal LV function; mild diastolic dysfunction; severe LVH. 8. Right atrial pressure is estimated at 8 mmHg.   Patient Profile     74 y.o. male admitted with CKD stage IV, severe hypertension, PRES and seizure, requiring brief intubation, also with complaints of chest pain and mild-moderate elevation in troponin in a plateau pattern.  History of negative stress test in 2018 and normal left ventricular wall motion and systolic function on echo on this admission.  Assessment & Plan    1. HTN: Requiring high doses of multiple medications.  Will increase the carvedilol to 25 mg twice daily.  May have to replace hydralazine/nitrates with minoxidil if we cannot otherwise control his blood pressure. 2.  Abnormal troponin: He is not a good candidate for invasive evaluation and is currently asymptomatic.  He flat pattern of his troponin elevation is not consistent with a true acute coronary syndrome, but does portend a poor prognosis. 3. CKD 4: Creatinine level is stable.  Avoiding RAAS inhibitors. 4. NSVT: Brief and asymptomatic.  Normal LV function.  Increase carvedilol.     For questions or updates, please contact [place] Please consult [email or web address] for contact info under        Signed, Sanda Klein, MD  07/03/2018,  8:55 AM

## 2018-07-03 NOTE — Progress Notes (Addendum)
PROGRESS NOTE  [name] TIR:[id number] DOB: [birth year] DOA: 06/20/2018 PCP: [name], MD  Brief summary:  73 y/o male admitted with possible seizure in setting of PRES, extubated on 2/16 Transferred to [facility] on 2/19 developed chest pain, htn, troponin elevation on 2/20, cardiology consulted Has FOBT + ,s/p EGD, pathology pending, no active bleed Need bp control, then snf placement  HPI/Recap of past 24 hours:   Had EGD yesterday, cortrak is out   He received one dose of ativan last night around 10pm, overall he has less intermittent agitation, he did not require sitter or mittens for the last two days,  Although still drowsy, overall he is more alert and interactive, he is tolerating oral intake  He  denies pain, no hypoxia, he is on room air, no fever Condom cath in place with clear urine  Blood pressure remain elevated     Assessment/Plan: Active Problems:   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[place])   Essential hypertension   Acute respiratory failure with hypoxemia (HCC)   Acute metabolic encephalopathy   Chronic pancreatitis (HCC)   Idiopathic chronic gout of multiple sites without tophus   Stroke ([place])   CKD (chronic kidney disease), stage III (HCC)   Seizure (HCC)   Status epilepticus ([place])   Malnutrition of moderate degree   CKD (chronic kidney disease), stage IV (HCC)   Chest pain of uncertain etiology   Troponin level elevated   Occult blood in stools   Microcytic anemia  Hypertensive emergency/PRES (presenting symptom), possible medication noncompliance prior to admission -he required airway protection, extubated on 2/16 -he is off  Cleviprex,  -he was started on nitrodrip due to chest pain and elevated blood pressure on 2/20,   now off nitrodrip, started on isosobie tid, no more chest pain -continue norvasc, clonidine, hydralazine , coreg, imdur -bp remain elevated on max dose of norvasc, hydralazine, imdur, coreg dose increased by cardiology, will try  one dose of iv lasix Initial plan to taper off clonidine due to drowsiness, but bp remain elevated, will  hold off decdrease clonidine for now. -cardiology following , appreciate input.    Possible seizure on presentation -he is off precedex drip, off propofol drip, currently on keppra If patient remain drowsy, may need to discuss with neurology. keppra could cause drowsiness as well  delirium -currently on night time zyprexa and neurontin for delirium/agitation  -try to avoid prn ativan -slowly improving   Chest pain, troponin elevation ( on 2/20): -patient has severe chest pain, elevated blood pressure on 2/20, troponin at 2 -cardiology consulted, he is started on nitrodrip, chest pain has resolved,  -egd did not show bleeding, gi oked with asa, asa started on 2/25 -bp control, continue betablocker, statin, cardiology/gi input appreciated, do not plan for invasive testing    NSVTx1 by report: Per RN from endo, patient has NSVTx8 beats on 2/24 prior to EGD , he was asymptomatic, bp stable Continue betablocker, keep mag>2, k>4, cardiology following  Anemia: likely anemia of chronic disease hgb 7.2, received prbc x1 on 2/20 FOBT + EGD on 2/24, no acute bleeding, oked to start on asa, pathology pending,  Need to follow up with Dr [name]    Hypernatremia: Sodium peaked at 150, normalized after  free water flushes per tube -now tolerating oral intake  Possible alcohol abuse, h/p chronic pancreatitis -daughter reports patient does not drink daily, patient does binge drinking when he feels depressed, daughter states he only does binge drinking for a few days in a  row, it only happens once a year > thiamine, folate   Dysphagia: S/p MBS  SLP Diet Recommendations: Nectar thick liquid;Other (Comment)(full liquid diet)   Liquid Administration via: Straw   Medication Administration: Crushed with puree   Supervision: Staff to assist with self feeding;Full  supervision/cueing for compensatory strategies   Compensations: Minimize environmental distractions;Slow rate;Small sips/bites;Follow solids with liquid   Postural Changes: Seated upright at 90 degrees   Oral Care Recommendations: Oral care BID   Other Recommendations: Order thickener from pharmacy;Prohibited food (jello, ice cream, thin soups);Remove water pitcher;Have oral suction available  cortrak removed on 2/24, tolerating po, speech continue to follow.    AKI on CKDIV Cr 4.01 on presentation, cr improving, today is 3.27 close to baseline Renal dosing meds Need nephrology follow up after discharge    Chronic leukemia (CML); Under the care of Dr. [name]. On bosutinib  Gout; On allopurinol/colchicine  Non-severe (moderate) malnutrition in context of chronic illness Nutrition input appreciated  Smoker/one pack a day -Mild centrilobular emphysema on CT chest from 02/2017 COPD, Diffuse wheezing on 2/20 and 2/21, no wheezing since Continue schedule nebs and mucinex  H/o right AKA.   Code Status: full  Family Communication: patient , daughter over the phone on 2/21  Disposition Plan: likely will need snf once blood pressure better controlled and less drowsy   Consultants:  Critical care  Neurology  GI   cardiology  Procedures:  cortrak feeding tube placement  EEG  Intubation/extubation   EGD on 2/24  Antibiotics:  none   Objective: BP (!) 181/93 (BP Location: Right Arm)   Pulse 92   Temp 99.1 F (37.3 C) (Oral)   Resp (!) 28   Ht 5\' 9"  (1.753 m)   Wt 57.1 kg   SpO2 99%   BMI 18.59 kg/m   Intake/Output Summary (Last 24 hours) at 07/03/2018 0749 Last data filed at 07/03/2018 2992 Gross per 24 hour  Intake 900 ml  Output 1700 ml  Net -800 ml   Filed Weights   06/28/18 0340 06/29/18 0513 07/03/18 0425  Weight: 63.1 kg 58.7 kg 57.1 kg    Exam: Patient is examined daily including today on 07/03/2018, exams remain the same as of  yesterday except that has changed    General:  Drowsy, know he is in the hospital, oriented to the year, month   Cardiovascular: RRR  Respiratory: no wheezing, improved aeration  Abdomen: Soft/ND/NT, positive BS  Musculoskeletal: No Edema, right aka  Neuro: drowsy  Data Reviewed: Basic Metabolic Panel: Recent Labs  Lab 06/27/18 0424 06/28/18 0537 06/29/18 0546 06/30/18 0340 07/01/18 0422 07/02/18 0430 07/02/18 1527 07/03/18 0310  NA 149* 147* 150* 150* 143 141 143 143  K 4.5 4.1 3.7 4.4 4.0 4.1 4.7 3.9  CL 125* 121* 121* 121* 115* 113*  --  113*  CO2 12* 15* 19* 19* 19* 18*  --  21*  GLUCOSE 95 137* 106* 103* 105* 98 94 105*  BUN 54* 59* 57* 55* 49* 48*  --  46*  CREATININE 3.24* 3.12* 3.25* 3.27* 3.18* 3.24*  --  3.27*  CALCIUM 8.3* 8.7* 8.6* 8.7* 8.3* 8.1*  --  8.2*  MG 2.1 2.0  --   --   --  1.7  --  2.0  PHOS 4.8* 2.7  --   --   --   --   --   --    Liver Function Tests: No results for input(s): AST, ALT, ALKPHOS, BILITOT, PROT, ALBUMIN in  the last 168 hours. Recent Labs  Lab 06/29/18 0546  LIPASE 23   No results for input(s): AMMONIA in the last 168 hours. CBC: Recent Labs  Lab 06/27/18 0424 06/28/18 0537 06/29/18 0546 07/02/18 1527 07/03/18 0310  WBC 11.0* 11.6*  --   --  9.8  NEUTROABS 8.9* 9.1*  --   --  7.1  HGB 7.2* 7.5* 7.9* 8.2* 8.1*  HCT 23.4* 24.0* 24.8* 24.0* 26.5*  MCV 78.3* 76.9*  --   --  79.3*  PLT 242 290  --   --  211   Cardiac Enzymes:   Recent Labs  Lab 06/28/18 1209 06/28/18 1652 06/28/18 2316  TROPONINI 2.03* 2.10* 2.06*   BNP (last 3 results) No results for input(s): BNP in the last 8760 hours.  ProBNP (last 3 results) No results for input(s): PROBNP in the last 8760 hours.  CBG: Recent Labs  Lab 07/02/18 0006 07/02/18 0749 07/02/18 1136 07/02/18 2020 07/03/18 0423  GLUCAP 99 86 93 136* 110*    No results found for this or any previous visit (from the past 240 hour(s)).   Studies: No results  found.  Scheduled Meds: . sodium chloride   Intravenous Once  . allopurinol  200 mg Per Tube Daily  . amLODipine  10 mg Per Tube Daily  . aspirin EC  81 mg Oral Daily  . atorvastatin  20 mg Per Tube q1800  . carvedilol  12.5 mg Oral BID WC  . chlorhexidine  15 mL Mouth Rinse BID  . cloNIDine  0.1 mg Per Tube TID  . colchicine  0.3 mg Per Tube Daily  . feeding supplement (ENSURE ENLIVE)  237 mL Oral Q1500  . folic acid  1 mg Per Tube Daily  . gabapentin  100 mg Per Tube QHS  . guaiFENesin  5 mL Per Tube TID  . hydrALAZINE  100 mg Per Tube TID  . isosorbide dinitrate  40 mg Per NG tube TID  . levETIRAcetam  500 mg Per Tube BID  . mouth rinse  15 mL Mouth Rinse q12n4p  . nicotine  21 mg Transdermal Daily  . OLANZapine  2.5 mg Per Tube QHS  . pantoprazole  40 mg Oral Q0600  . thiamine  100 mg Per Tube Daily    Continuous Infusions:    Time spent: 25 I have personally reviewed and interpreted on  07/03/2018 daily labs, tele strips, imagings as discussed above under date review session and assessment and plans.  I reviewed all nursing notes, pharmacy notes, consultant notes,  vitals, pertinent old records  I have discussed plan of care as described above with RN , patient on 07/03/2018   Florencia Reasons MD, PhD  [facility] Pager [phone]. If 7PM-7AM, please contact night-coverage at [email or web address], password [facility] 07/03/2018, 7:49 AM  LOS: 12 days

## 2018-07-04 LAB — BASIC METABOLIC PANEL
Anion gap: 9 (ref 5–15)
BUN: 45 mg/dL — ABNORMAL HIGH (ref 8–23)
CO2: 24 mmol/L (ref 22–32)
Calcium: 8.1 mg/dL — ABNORMAL LOW (ref 8.9–10.3)
Chloride: 109 mmol/L (ref 98–111)
Creatinine, Ser: 3.33 mg/dL — ABNORMAL HIGH (ref 0.61–1.24)
GFR calc Af Amer: 20 mL/min — ABNORMAL LOW (ref >60)
GFR calc non Af Amer: 17 mL/min — ABNORMAL LOW (ref >60)
Glucose, Bld: 114 mg/dL — ABNORMAL HIGH (ref 70–99)
Potassium: 3.8 mmol/L (ref 3.5–5.1)
Sodium: 142 mmol/L (ref 135–145)

## 2018-07-04 LAB — GLUCOSE, CAPILLARY
Glucose-Capillary: 155 mg/dL — ABNORMAL HIGH (ref 70–99)
Glucose-Capillary: 157 mg/dL — ABNORMAL HIGH (ref 70–99)
Glucose-Capillary: 92 mg/dL (ref 70–99)
Glucose-Capillary: 93 mg/dL (ref 70–99)
Glucose-Capillary: 95 mg/dL (ref 70–99)
Glucose-Capillary: 96 mg/dL (ref 70–99)

## 2018-07-04 MED ORDER — MAGIC MOUTHWASH W/LIDOCAINE
2.0000 mL | Freq: Three times a day (TID) | ORAL | Status: DC
  Administered 2018-07-04 – 2018-07-05 (×3): 2 mL via ORAL
  Filled 2018-07-04 (×4): qty 5

## 2018-07-04 MED ORDER — CARVEDILOL 25 MG PO TABS
50.0000 mg | ORAL_TABLET | Freq: Two times a day (BID) | ORAL | Status: DC
  Administered 2018-07-04 – 2018-07-05 (×2): 50 mg via ORAL
  Filled 2018-07-04 (×2): qty 2

## 2018-07-04 MED ORDER — ASPIRIN 81 MG PO CHEW
81.0000 mg | CHEWABLE_TABLET | Freq: Every day | ORAL | Status: DC
  Administered 2018-07-05: 81 mg via ORAL
  Filled 2018-07-04: qty 1

## 2018-07-04 NOTE — Progress Notes (Addendum)
Progress Note  Patient Name: [name] Date of Encounter: 07/04/2018  Primary Cardiologist: Bruce Furbish, MD   Subjective   No significant overnight events. Patient remains very sleep but is easy to wake. He denies any current chest pain but states he had some last night. He also notes some shortness of breath.   Inpatient Medications    Scheduled Meds: . sodium chloride   Intravenous Once  . allopurinol  200 mg Oral Daily  . amLODipine  10 mg Oral Daily  . aspirin  81 mg Oral Daily  . atorvastatin  20 mg Oral q1800  . carvedilol  25 mg Oral BID WC  . chlorhexidine  15 mL Mouth Rinse BID  . cloNIDine  0.1 mg Oral TID  . colchicine  0.3 mg Oral Daily  . feeding supplement (ENSURE ENLIVE)  237 mL Oral Q1500  . folic acid  1 mg Oral Daily  . gabapentin  100 mg Oral QHS  . guaiFENesin  5 mL Oral TID  . hydrALAZINE  100 mg Oral TID  . isosorbide dinitrate  40 mg Oral TID  . levETIRAcetam  500 mg Oral BID  . lipase/protease/amylase  12,000 Units Oral TID WC  . mouth rinse  15 mL Mouth Rinse q12n4p  . nicotine  21 mg Transdermal Daily  . OLANZapine  2.5 mg Oral QHS  . pantoprazole sodium  40 mg Oral BID  . thiamine  100 mg Oral Daily   Continuous Infusions:  PRN Meds: acetaminophen **OR** acetaminophen (TYLENOL) oral liquid 160 mg/5 mL **OR** acetaminophen, albuterol, ipratropium-albuterol, loperamide HCl, LORazepam, morphine injection, nitroGLYCERIN, oxyCODONE-acetaminophen, RESOURCE THICKENUP CLEAR, senna-docusate   Vital Signs    Vitals:   07/03/18 2030 07/03/18 2300 07/04/18 0449 07/04/18 0752  BP: (!) 146/90 (!) 143/79 (!) 181/84 (!) 163/83  Pulse: 84 82 79 82  Resp: 20 13 20  (!) 22  Temp: 100.1 F (37.8 C) 98.6 F (37 C) 98 F (36.7 C) 100.1 F (37.8 C)  TempSrc: Oral Oral Oral Oral  SpO2: 99% 99% 100% 100%  Weight:   58.6 kg   Height:        Intake/Output Summary (Last 24 hours) at 07/04/2018 1050 Last data filed at 07/04/2018 0900 Gross per 24 hour    Intake 120 ml  Output 1500 ml  Net -1380 ml   Last 3 Weights 07/04/2018 07/03/2018 06/29/2018  Weight (lbs) 129 lb 3 oz 125 lb 14.1 oz 129 lb 6.6 oz  Weight (kg) 58.6 kg 57.1 kg 58.7 kg      Telemetry    Sinus rhythm with heart rates in the 70's to 80's with a few PVCs. - Personally Reviewed  ECG    No new ECG tracing since 06/28/2018. - Personally Reviewed  Physical Exam   GEN: African-American male resting comfortably. Very drowsy but easy to wake. No acute distress.  Neck: Supple. Cardiac: RRR. No murmurs, rubs, or gallops.  Respiratory: Diminished breath sounds throughout but lungs sound clear. GI: Soft, nontender, non-distended. Bowel sounds present. MS: No lower extremity edema. S/p right above the knee amputation.  Neuro:  No focal deficit. Psych: Drowsy.  Labs    Chemistry Recent Labs  Lab 07/02/18 0430 07/02/18 1527 07/03/18 0310 07/04/18 0423  NA 141 143 143 142  K 4.1 4.7 3.9 3.8  CL 113*  --  113* 109  CO2 18*  --  21* 24  GLUCOSE 98 94 105* 114*  BUN 48*  --  46* 45*  CREATININE  3.24*  --  3.27* 3.33*  CALCIUM 8.1*  --  8.2* 8.1*  GFRNONAA 18*  --  18* 17*  GFRAA 21*  --  21* 20*  ANIONGAP 10  --  9 9     Hematology Recent Labs  Lab 06/28/18 0537 06/29/18 0546 07/02/18 1527 07/03/18 0310  WBC 11.6*  --   --  9.8  RBC 3.12*  --   --  3.34*  HGB 7.5* 7.9* 8.2* 8.1*  HCT 24.0* 24.8* 24.0* 26.5*  MCV 76.9*  --   --  79.3*  MCH 24.0*  --   --  24.3*  MCHC 31.3  --   --  30.6  RDW 18.2*  --   --  17.5*  PLT 290  --   --  211    Cardiac Enzymes Recent Labs  Lab 06/28/18 1209 06/28/18 1652 06/28/18 2316  TROPONINI 2.03* 2.10* 2.06*   No results for input(s): TROPIPOC in the last 168 hours.   BNPNo results for input(s): BNP, PROBNP in the last 168 hours.   DDimer No results for input(s): DDIMER in the last 168 hours.   Radiology    No results found.  Cardiac Studies   Echocardiogram 06/21/2018: Impressions:  1. The left  ventricle has normal systolic function with an ejection fraction of 60-65%. The cavity size was normal. There is severely increased left ventricular wall thickness. Left ventricular diastolic Doppler parameters are consistent with impaired  relaxation.  2. The right ventricle has normal systolic function. The cavity was normal. There is no increase in right ventricular wall thickness.  3. The mitral valve is normal in structure. There is mild thickening.  4. The tricuspid valve is normal in structure.  5. The aortic valve is tricuspid There is mild thickening of the aortic valve.  6. The pulmonic valve was normal in structure.  7. Normal LV function; mild diastolic dysfunction; severe LVH.  8. Right atrial pressure is estimated at 8 mmHg.  Patient Profile   [name] is a 73 y.o. male with a history of long standing hypertension, chronic kidney disease, HLD, leukemia, and ongoing tobacco abuse admitted with altered mental status due to accelerated HTN and PRES found on MRI. He was intubated initially. He is now extubated. He reports remote MI but no recent cardiac catheterization. Negative stress test in 2018. Echo 06/21/18 with normal LV systolic function with severe LVH. He has been complaining of chest pain since being extubated. First troponin 2.0 on 06/28/18. There are no other troponin levels during this hospital stay. He is anemic with Hgb 7.5 on 06/28/18. Creatinine is over 3.0. Reportedly had improvement of chest pain with NTG. He has a history of negative stress test in 2018.  Assessment & Plan   Hypertension - BP remains difficult to control. Most recent BP 163/83.  - Continue Amlodipine 10mg  daily, Clonidine 0.1mg  three times daily, Hydralazine 100mg  three times daily, and Isordil to 40mg  three times daily. - Will increase Coreg from 25mg  to 50mg  twice daily. - May have to replace hydralazine/nitrates with minoxidil if we cannot otherwise control his blood pressure.  Chest Pain with  Elevated Troponin  - Last EKG on 06/28/2018 showed T wave inversion in inferior and lateral leads in setting of LVH.  - Troponin elevated and flat at 2.03 >> 2.10 >> 2.06. Not consistent with ACS but does portend a poor prognosis. - He is not a good candidate for invasive evaluation and is currently asymptomatic.  Non-Sustained VT -  Patient reportedly had 8 beats of NSVT during EGD. - Telemetry shows sinus rhythm with heart rates in the 70's to 80's with a few PVCs. - LVEF 60-65% on Echo this admission. - Continue Coreg as above.  CKD Stage IV - Serum creatinine 3.33 today, up slightly from 3.27 yesterday. - Avoid ACEi/ARB and other nephrotoxic agents. - Continue to monitor renal function daily.   Anemia - Hemoglobin stable at 8.1.  - EGD showed a hiatal hernia and a rather atonic esophagus but no source of bleeding. - Management per primary team  Otherwise, per primary team.  For questions or updates, please contact [place] Please consult [email or web address] for contact info under        Signed, Darreld Mclean, PA-C  07/04/2018, 10:50 AM    I have seen and examined the patient along with Darreld Mclean, PA-C .  I have reviewed the chart, notes and new data.  I agree with PA/NP's note.  Key new complaints: currently asymptomatic Key examination changes: severely deconditioned. BP still high Key new findings / data: creat essentially unchanged  PLAN: Increase carvedilol to 50 mg BID. IF BP remains high, will replace hydralazine with minoxidil.  Sanda Klein, MD, [place] [phone] 07/04/2018, 10:54 AM
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
## 2018-07-04 NOTE — Clinical Social Work Note (Signed)
Clinical Social Work Assessment  Patient Details  Name: [name] MRN: [id number] Date of Birth: 03/03/1946  Date of referral:  07/04/18               Reason for consult:  Facility Placement, Discharge Planning                Permission sought to share information with:  Facility Sport and exercise psychologist, Family Supports Permission granted to share information::  No  Name::        Agency::     Relationship::     Contact Information:     Housing/Transportation Living arrangements for the past 2 months:  Apartment Source of Information:  Patient Patient Interpreter Needed:  None Criminal Activity/Legal Involvement Pertinent to Current Situation/Hospitalization:  No - Comment as needed Significant Relationships:  Adult Children, Significant Other Lives with:  Self Do you feel safe going back to the place where you live?  Yes Need for family participation in patient care:  Yes (Comment)  Care giving concerns: Patient from home alone. PT recommending SNF.   Social Worker assessment / plan: CSW met with patient at bedside. Patient alert and oriented, receiving assistance eating from NT. CSW introduced self and role and discussed disposition planning - PT recommendation for SNF.   Patient refused SNF, stating, "I'm not trying to do that." CSW reflected on PT notes, in which patient needed mod assist for transfer, and was not able to get up and ambulate. Also patient receiving assistance with eating during assessment. Patient indicated his neighbor and his girlfriend can help him. Unclear if they can provide 24 hour care. Patient stated "I get like that sometimes" when discussing that patient wasn't able to get up and walk.  PT to see patient again today. Will re-assess for SNF after updated notes in. Patient did not give permission to discuss with family.  CSW to follow.  Employment status:  Retired Forensic scientist:  [employer] PT Recommendations:  [place] / Referral to community resources:  [place]  Patient/Family's Response to care: Patient appreciative of care.  Patient/Family's Understanding of and Emotional Response to Diagnosis, Current Treatment, and Prognosis: Patient refusing SNF. Will re-assess.  Emotional Assessment Appearance:  Appears stated age Attitude/Demeanor/Rapport:  Engaged Affect (typically observed):  Calm, Guarded, Appropriate Orientation:  Oriented to Self, Oriented to Place, Oriented to  Time, Oriented to Situation Alcohol / Substance use:  Not Applicable Psych involvement (Current and /or in the community):  No (Comment)  Discharge Needs  Concerns to be addressed:  Discharge Planning Concerns, Care Coordination Readmission within the last 30 days:  No Current discharge risk:  Physical Impairment, Dependent with Mobility, Lives alone Barriers to Discharge:  Continued Medical Work up   Estanislado Emms, LCSW 07/04/2018, 9:59 AM
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
## 2018-07-04 NOTE — Social Work (Addendum)
4:15 pm [name] returned call. She confirmed patient has support at home, including herself and neighbor.   3:42 pm CSW met with patient at bedside again. Patient continues to refuse SNF. He explained that his girlfriend, [name], provides support for him at home, along with a neighbor.   CSW discussed with patient's daughter, Lajean Saver, on the phone. She is aware and understanding that patient refusing SNF. She confirmed that [name] provides support at home. CSW left message for [name], awaiting call back.  Updated RNCM that patient refusing SNF. CM will follow for home health needs.  Estanislado Emms, LCSW [phone]
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
## 2018-07-04 NOTE — Plan of Care (Signed)

## 2018-07-05 LAB — GLUCOSE, CAPILLARY
GLUCOSE-CAPILLARY: 104 mg/dL — AB (ref 70–99)
Glucose-Capillary: 134 mg/dL — ABNORMAL HIGH (ref 70–99)
Glucose-Capillary: 98 mg/dL (ref 70–99)

## 2018-07-05 MED ORDER — PANTOPRAZOLE SODIUM 40 MG PO PACK: 40.0000 mg | PACK | Freq: Two times a day (BID) | ORAL | 3 refills | Status: DC

## 2018-07-05 MED ORDER — HYDRALAZINE HCL 100 MG PO TABS: 100.0000 mg | ORAL_TABLET | Freq: Three times a day (TID) | ORAL | 3 refills | Status: DC

## 2018-07-05 MED ORDER — CLONIDINE HCL 0.1 MG PO TABS: 0.1000 mg | ORAL_TABLET | Freq: Three times a day (TID) | ORAL | 3 refills | Status: DC

## 2018-07-05 MED ORDER — ASPIRIN 81 MG PO CHEW: 81.0000 mg | CHEWABLE_TABLET | Freq: Every day | ORAL | 3 refills | Status: DC

## 2018-07-05 MED ORDER — ISOSORBIDE DINITRATE 40 MG PO TABS: 40.0000 mg | ORAL_TABLET | Freq: Three times a day (TID) | ORAL | 3 refills | Status: DC

## 2018-07-05 MED ORDER — RESOURCE THICKENUP CLEAR PO POWD: 1.0000 g | ORAL | Status: DC | PRN

## 2018-07-05 MED ORDER — LEVETIRACETAM 500 MG PO TABS: 500.0000 mg | ORAL_TABLET | Freq: Two times a day (BID) | ORAL | 3 refills | Status: AC

## 2018-07-05 MED ORDER — AMLODIPINE BESYLATE 10 MG PO TABS: 10.0000 mg | ORAL_TABLET | Freq: Every day | ORAL | 3 refills | Status: AC

## 2018-07-05 MED ORDER — GABAPENTIN 100 MG PO CAPS: 100.0000 mg | ORAL_CAPSULE | Freq: Three times a day (TID) | ORAL | 2 refills | Status: DC

## 2018-07-05 MED ORDER — ACETAMINOPHEN 325 MG PO TABS: 650.0000 mg | ORAL_TABLET | ORAL | 0 refills | Status: AC | PRN

## 2018-07-05 MED ORDER — NICOTINE 21 MG/24HR TD PT24: 21.0000 mg | MEDICATED_PATCH | Freq: Every day | TRANSDERMAL | 0 refills | Status: AC

## 2018-07-05 MED ORDER — OLANZAPINE 2.5 MG PO TABS: 2.5000 mg | ORAL_TABLET | Freq: Every day | ORAL | 3 refills | Status: DC

## 2018-07-05 MED ORDER — MAGIC MOUTHWASH W/LIDOCAINE: 2.0000 mL | Freq: Three times a day (TID) | ORAL | 10 refills | Status: DC

## 2018-07-05 MED ORDER — CARVEDILOL 25 MG PO TABS: 50.0000 mg | ORAL_TABLET | Freq: Two times a day (BID) | ORAL | 3 refills | Status: AC
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
## 2018-07-05 NOTE — Care Management Note (Signed)
Case Management Note  Patient Details  Name: [name] MRN: [id number] Date of Birth: [birth year]  Subjective/Objective:  Pt presented for weakness, confusion. PTA from home alone. Patient states he has support from significant other that can stay with him. He has an Biomedical engineer that visits in the home. Patient has declined SNF at this time.                    Action/Plan: Per significant other- she will be with patient to assist him in the home and she wants to use [facility]. Referral made to [facility] with [name] and [facility] to begin within 24-48 hours post transition home.   Expected Discharge Date:  07/05/18               Expected Discharge Plan:  [place]  In-House Referral:  Clinical Social Work  Discharge planning Services  CM Consult  Post Acute Care Choice:  Home Health Choice offered to:  Patient(Significant other)  DME Arranged:  N/A DME Agency:  NA  HH Arranged:  RN, Disease Management, PT, Refused SNF [facility] Agency:  [facility]  Status of Service:  Completed, signed off  If discussed at [place] of Stay Meetings, dates discussed:    Additional Comments:  Bethena Roys, RN 07/05/2018, 1:06 PM

## 2018-07-11 ENCOUNTER — Other Ambulatory Visit: Payer: Self-pay | Admitting: *Deleted

## 2018-07-11 DIAGNOSIS — C9210 Chronic myeloid leukemia, BCR/ABL-positive, not having achieved remission: Secondary | ICD-10-CM

## 2018-07-11 NOTE — Progress Notes (Signed)
Per MD: needs lab/ov week of 3/9. Scheduling message sent and labs ordered.

## 2018-07-12 ENCOUNTER — Telehealth: Payer: Self-pay | Admitting: Nurse Practitioner

## 2018-07-12 NOTE — Telephone Encounter (Signed)
Scheduled appt per 3/5 sch message - -pt aware of appt date and time   

## 2018-07-13 ENCOUNTER — Other Ambulatory Visit: Payer: Self-pay | Admitting: *Deleted

## 2018-07-13 MED ORDER — ONDANSETRON 4 MG PO TBDP: 4.0000 mg | ORAL_TABLET | Freq: Three times a day (TID) | ORAL | 0 refills | Status: DC | PRN

## 2018-07-17 ENCOUNTER — Inpatient Hospital Stay: Payer: Medicare Other

## 2018-07-17 ENCOUNTER — Inpatient Hospital Stay: Payer: Medicare Other | Attending: Nurse Practitioner | Admitting: Nurse Practitioner

## 2018-07-18 ENCOUNTER — Telehealth: Payer: Self-pay

## 2018-07-18 NOTE — Telephone Encounter (Signed)
Returned TC to pt in regard to next appointment date and time. Next appointment is 08/02/18 at 10am. Pt verbalized understanding. No further problems or concerns at this time.

## 2018-07-19 ENCOUNTER — Telehealth: Payer: Self-pay | Admitting: Pharmacist

## 2018-07-19 NOTE — Telephone Encounter (Signed)
Oral Chemotherapy Pharmacist Encounter   Attempted to reach patient for follow up on oral medication: Bolusif.  No answer. Left VM for patient to call back.   Trying to assess medication supply on hand and follow-up status post discharge from recent hospitalization. Patient no-showed OV on 3/10, has been rescheduled for 3/26.  I will continue to try to reach patient for follow-up.  Johny Drilling, PharmD, BCPS, BCOP  07/19/2018   12:49 PM [facility] [phone]

## 2018-07-25 ENCOUNTER — Encounter: Payer: Self-pay | Admitting: Cardiology

## 2018-07-26 MED FILL — BOSULIF 100 MG TABLET: 100 | 30 days supply | Qty: 60 | Fill #0

## 2018-07-31 ENCOUNTER — Telehealth: Payer: Self-pay

## 2018-07-31 NOTE — Telephone Encounter (Signed)
Mailbox full 3/24

## 2018-08-01 ENCOUNTER — Other Ambulatory Visit: Payer: Self-pay | Admitting: *Deleted

## 2018-08-01 ENCOUNTER — Telehealth: Payer: Self-pay

## 2018-08-01 DIAGNOSIS — C9210 Chronic myeloid leukemia, BCR/ABL-positive, not having achieved remission: Secondary | ICD-10-CM

## 2018-08-01 MED ORDER — DIPHENOXYLATE-ATROPINE 2.5-0.025 MG PO TABS: ORAL_TABLET | ORAL | 0 refills | Status: DC

## 2018-08-01 NOTE — Telephone Encounter (Signed)
Mailbox full 3/25

## 2018-08-01 NOTE — Telephone Encounter (Signed)
   Cardiac Questionnaire:    Since your last visit or hospitalization:    1. Have you been having new or worsening chest pain? no   2. Have you been having new or worsening shortness of breath? no 3. Have you been having new or worsening leg swelling, wt gain, or increase in abdominal girth (pants fitting more tightly)? no   4. Have you had any passing out spells? no    *A YES to any of these questions would result in the appointment being kept. *If all the answers to these questions are NO, we should indicate that given the current situation regarding the worldwide coronarvirus pandemic, at the recommendation of the CDC, we are looking to limit gatherings in our waiting area, and thus will reschedule their appointment beyond four weeks from today.   _____________   Arranged a telephone visit with the patient 3/25 @10 :00

## 2018-08-02 ENCOUNTER — Telehealth: Payer: Self-pay | Admitting: *Deleted

## 2018-08-02 ENCOUNTER — Telehealth (INDEPENDENT_AMBULATORY_CARE_PROVIDER_SITE_OTHER): Payer: Medicare Other | Admitting: Cardiology

## 2018-08-02 ENCOUNTER — Encounter: Payer: Self-pay | Admitting: Cardiology

## 2018-08-02 ENCOUNTER — Other Ambulatory Visit: Payer: Self-pay

## 2018-08-02 VITALS — BP 182/93 | HR 90 | Ht 68.0 in | Wt 126.0 lb

## 2018-08-02 DIAGNOSIS — I10 Essential (primary) hypertension: Secondary | ICD-10-CM

## 2018-08-02 DIAGNOSIS — R079 Chest pain, unspecified: Secondary | ICD-10-CM | POA: Diagnosis not present

## 2018-08-02 DIAGNOSIS — Z79899 Other long term (current) drug therapy: Secondary | ICD-10-CM

## 2018-08-02 DIAGNOSIS — N189 Chronic kidney disease, unspecified: Secondary | ICD-10-CM

## 2018-08-02 DIAGNOSIS — D631 Anemia in chronic kidney disease: Secondary | ICD-10-CM

## 2018-08-02 DIAGNOSIS — Z7982 Long term (current) use of aspirin: Secondary | ICD-10-CM

## 2018-08-02 DIAGNOSIS — I6523 Occlusion and stenosis of bilateral carotid arteries: Secondary | ICD-10-CM | POA: Diagnosis not present

## 2018-08-02 DIAGNOSIS — J449 Chronic obstructive pulmonary disease, unspecified: Secondary | ICD-10-CM

## 2018-08-02 DIAGNOSIS — E785 Hyperlipidemia, unspecified: Secondary | ICD-10-CM

## 2018-08-02 DIAGNOSIS — C9210 Chronic myeloid leukemia, BCR/ABL-positive, not having achieved remission: Secondary | ICD-10-CM

## 2018-08-02 NOTE — Patient Instructions (Signed)
Continue current medications the same. No changes are being made today.   F/u with Dr. Marlou Porch in 6 months.
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
## 2018-08-02 NOTE — Telephone Encounter (Signed)
Patient missed last office visit. Per Dr. [name]: need lab/OV asap. Called patient and scheduled for Monday afternoon. Could not come in am due to transportation.

## 2018-08-05 ENCOUNTER — Emergency Department (HOSPITAL_COMMUNITY): Payer: Medicare Other

## 2018-08-05 ENCOUNTER — Other Ambulatory Visit: Payer: Self-pay

## 2018-08-05 ENCOUNTER — Encounter (HOSPITAL_COMMUNITY): Payer: Self-pay | Admitting: *Deleted

## 2018-08-05 ENCOUNTER — Inpatient Hospital Stay (HOSPITAL_COMMUNITY)
Admission: EM | Admit: 2018-08-05 | Discharge: 2018-08-09 | DRG: 811 | Disposition: A | Discharge status: Home / Self Care | Payer: Medicare Other | Attending: Internal Medicine | Admitting: Internal Medicine

## 2018-08-05 ENCOUNTER — Emergency Department (HOSPITAL_COMMUNITY)
Admission: EM | Admit: 2018-08-05 | Discharge: 2018-08-05 | Disposition: A | Discharge status: Home / Self Care | Payer: Medicare Other | Source: Home / Self Care

## 2018-08-05 DIAGNOSIS — K219 Gastro-esophageal reflux disease without esophagitis: Secondary | ICD-10-CM | POA: Diagnosis present

## 2018-08-05 DIAGNOSIS — E785 Hyperlipidemia, unspecified: Secondary | ICD-10-CM | POA: Diagnosis present

## 2018-08-05 DIAGNOSIS — N183 Chronic kidney disease, stage 3 (moderate): Secondary | ICD-10-CM | POA: Diagnosis not present

## 2018-08-05 DIAGNOSIS — N184 Chronic kidney disease, stage 4 (severe): Secondary | ICD-10-CM | POA: Diagnosis present

## 2018-08-05 DIAGNOSIS — I5033 Acute on chronic diastolic (congestive) heart failure: Secondary | ICD-10-CM | POA: Diagnosis not present

## 2018-08-05 DIAGNOSIS — J449 Chronic obstructive pulmonary disease, unspecified: Secondary | ICD-10-CM | POA: Diagnosis present

## 2018-08-05 DIAGNOSIS — K861 Other chronic pancreatitis: Secondary | ICD-10-CM | POA: Diagnosis present

## 2018-08-05 DIAGNOSIS — Z66 Do not resuscitate: Secondary | ICD-10-CM | POA: Diagnosis present

## 2018-08-05 DIAGNOSIS — J9621 Acute and chronic respiratory failure with hypoxia: Secondary | ICD-10-CM | POA: Diagnosis present

## 2018-08-05 DIAGNOSIS — N179 Acute kidney failure, unspecified: Secondary | ICD-10-CM | POA: Diagnosis not present

## 2018-08-05 DIAGNOSIS — D509 Iron deficiency anemia, unspecified: Secondary | ICD-10-CM | POA: Diagnosis present

## 2018-08-05 DIAGNOSIS — D62 Acute posthemorrhagic anemia: Secondary | ICD-10-CM | POA: Diagnosis present

## 2018-08-05 DIAGNOSIS — I2510 Atherosclerotic heart disease of native coronary artery without angina pectoris: Secondary | ICD-10-CM | POA: Diagnosis present

## 2018-08-05 DIAGNOSIS — I10 Essential (primary) hypertension: Secondary | ICD-10-CM | POA: Diagnosis not present

## 2018-08-05 DIAGNOSIS — I160 Hypertensive urgency: Secondary | ICD-10-CM | POA: Diagnosis present

## 2018-08-05 DIAGNOSIS — E872 Acidosis: Secondary | ICD-10-CM | POA: Diagnosis present

## 2018-08-05 DIAGNOSIS — N189 Chronic kidney disease, unspecified: Secondary | ICD-10-CM | POA: Diagnosis not present

## 2018-08-05 DIAGNOSIS — Z7951 Long term (current) use of inhaled steroids: Secondary | ICD-10-CM

## 2018-08-05 DIAGNOSIS — R269 Unspecified abnormalities of gait and mobility: Secondary | ICD-10-CM | POA: Diagnosis present

## 2018-08-05 DIAGNOSIS — I361 Nonrheumatic tricuspid (valve) insufficiency: Secondary | ICD-10-CM | POA: Diagnosis not present

## 2018-08-05 DIAGNOSIS — I509 Heart failure, unspecified: Secondary | ICD-10-CM

## 2018-08-05 DIAGNOSIS — I5042 Chronic combined systolic (congestive) and diastolic (congestive) heart failure: Secondary | ICD-10-CM | POA: Diagnosis present

## 2018-08-05 DIAGNOSIS — I130 Hypertensive heart and chronic kidney disease with heart failure and stage 1 through stage 4 chronic kidney disease, or unspecified chronic kidney disease: Secondary | ICD-10-CM | POA: Diagnosis present

## 2018-08-05 DIAGNOSIS — I340 Nonrheumatic mitral (valve) insufficiency: Secondary | ICD-10-CM | POA: Diagnosis not present

## 2018-08-05 DIAGNOSIS — I501 Left ventricular failure: Secondary | ICD-10-CM | POA: Diagnosis not present

## 2018-08-05 DIAGNOSIS — Z8249 Family history of ischemic heart disease and other diseases of the circulatory system: Secondary | ICD-10-CM

## 2018-08-05 DIAGNOSIS — D638 Anemia in other chronic diseases classified elsewhere: Secondary | ICD-10-CM | POA: Diagnosis present

## 2018-08-05 DIAGNOSIS — G40909 Epilepsy, unspecified, not intractable, without status epilepticus: Secondary | ICD-10-CM | POA: Diagnosis present

## 2018-08-05 DIAGNOSIS — F17200 Nicotine dependence, unspecified, uncomplicated: Secondary | ICD-10-CM | POA: Diagnosis present

## 2018-08-05 DIAGNOSIS — R0600 Dyspnea, unspecified: Secondary | ICD-10-CM | POA: Diagnosis present

## 2018-08-05 DIAGNOSIS — Z8614 Personal history of Methicillin resistant Staphylococcus aureus infection: Secondary | ICD-10-CM

## 2018-08-05 DIAGNOSIS — Z7982 Long term (current) use of aspirin: Secondary | ICD-10-CM

## 2018-08-05 DIAGNOSIS — E875 Hyperkalemia: Secondary | ICD-10-CM | POA: Diagnosis present

## 2018-08-05 DIAGNOSIS — F419 Anxiety disorder, unspecified: Secondary | ICD-10-CM | POA: Diagnosis present

## 2018-08-05 DIAGNOSIS — W19XXXA Unspecified fall, initial encounter: Secondary | ICD-10-CM | POA: Diagnosis present

## 2018-08-05 DIAGNOSIS — I129 Hypertensive chronic kidney disease with stage 1 through stage 4 chronic kidney disease, or unspecified chronic kidney disease: Secondary | ICD-10-CM | POA: Diagnosis not present

## 2018-08-05 DIAGNOSIS — K5730 Diverticulosis of large intestine without perforation or abscess without bleeding: Secondary | ICD-10-CM | POA: Diagnosis present

## 2018-08-05 DIAGNOSIS — C9210 Chronic myeloid leukemia, BCR/ABL-positive, not having achieved remission: Secondary | ICD-10-CM | POA: Diagnosis present

## 2018-08-05 DIAGNOSIS — D649 Anemia, unspecified: Secondary | ICD-10-CM

## 2018-08-05 DIAGNOSIS — Z79899 Other long term (current) drug therapy: Secondary | ICD-10-CM

## 2018-08-05 DIAGNOSIS — I252 Old myocardial infarction: Secondary | ICD-10-CM | POA: Diagnosis not present

## 2018-08-05 DIAGNOSIS — R0603 Acute respiratory distress: Secondary | ICD-10-CM | POA: Diagnosis not present

## 2018-08-05 DIAGNOSIS — Z91041 Radiographic dye allergy status: Secondary | ICD-10-CM

## 2018-08-05 DIAGNOSIS — Z955 Presence of coronary angioplasty implant and graft: Secondary | ICD-10-CM

## 2018-08-05 DIAGNOSIS — D631 Anemia in chronic kidney disease: Secondary | ICD-10-CM | POA: Diagnosis present

## 2018-08-05 DIAGNOSIS — K625 Hemorrhage of anus and rectum: Secondary | ICD-10-CM | POA: Diagnosis present

## 2018-08-05 DIAGNOSIS — M1A9XX1 Chronic gout, unspecified, with tophus (tophi): Secondary | ICD-10-CM | POA: Diagnosis present

## 2018-08-05 DIAGNOSIS — Z96652 Presence of left artificial knee joint: Secondary | ICD-10-CM | POA: Diagnosis present

## 2018-08-05 DIAGNOSIS — Z89611 Acquired absence of right leg above knee: Secondary | ICD-10-CM

## 2018-08-05 HISTORY — DX: Chronic obstructive pulmonary disease, unspecified: J44.9

## 2018-08-05 LAB — URINALYSIS, ROUTINE W REFLEX MICROSCOPIC
Bilirubin Urine: NEGATIVE
Glucose, UA: NEGATIVE mg/dL
Ketones, ur: NEGATIVE mg/dL
Nitrite: NEGATIVE
Protein, ur: 100 mg/dL — AB
Specific Gravity, Urine: 1.014 (ref 1.005–1.030)
WBC, UA: >50 WBC/hpf — ABNORMAL HIGH (ref 0–5)
pH: 5 (ref 5.0–8.0)

## 2018-08-05 LAB — COMPREHENSIVE METABOLIC PANEL
ALBUMIN: 2.6 g/dL — AB (ref 3.5–5.0)
ALT: 41 U/L (ref 0–44)
ANION GAP: 10 (ref 5–15)
AST: 43 U/L — ABNORMAL HIGH (ref 15–41)
Alkaline Phosphatase: 91 U/L (ref 38–126)
BUN: 54 mg/dL — ABNORMAL HIGH (ref 8–23)
CO2: 16 mmol/L — ABNORMAL LOW (ref 22–32)
Calcium: 8.2 mg/dL — ABNORMAL LOW (ref 8.9–10.3)
Chloride: 114 mmol/L — ABNORMAL HIGH (ref 98–111)
Creatinine, Ser: 4.02 mg/dL — ABNORMAL HIGH (ref 0.61–1.24)
GFR calc Af Amer: 16 mL/min — ABNORMAL LOW (ref >60)
GFR calc non Af Amer: 14 mL/min — ABNORMAL LOW (ref >60)
Glucose, Bld: 111 mg/dL — ABNORMAL HIGH (ref 70–99)
POTASSIUM: 5.3 mmol/L — AB (ref 3.5–5.1)
Sodium: 140 mmol/L (ref 135–145)
Total Bilirubin: 0.7 mg/dL (ref 0.3–1.2)
Total Protein: 5.9 g/dL — ABNORMAL LOW (ref 6.5–8.1)

## 2018-08-05 LAB — CBC WITH DIFFERENTIAL/PLATELET
Abs Immature Granulocytes: 0.09 10*3/uL — ABNORMAL HIGH (ref 0.00–0.07)
BASOS PCT: 0 %
Basophils Absolute: 0 10*3/uL (ref 0.0–0.1)
Eosinophils Absolute: 0.1 10*3/uL (ref 0.0–0.5)
Eosinophils Relative: 1 %
HCT: 18.1 % — ABNORMAL LOW (ref 39.0–52.0)
Hemoglobin: 5.2 g/dL — CL (ref 13.0–17.0)
IMMATURE GRANULOCYTES: 1 %
Lymphocytes Relative: 9 %
Lymphs Abs: 1 10*3/uL (ref 0.7–4.0)
MCH: 23.3 pg — ABNORMAL LOW (ref 26.0–34.0)
MCHC: 28.7 g/dL — ABNORMAL LOW (ref 30.0–36.0)
MCV: 81.2 fL (ref 80.0–100.0)
Monocytes Absolute: 1.4 10*3/uL — ABNORMAL HIGH (ref 0.1–1.0)
Monocytes Relative: 12 %
NEUTROS ABS: 9.2 10*3/uL — AB (ref 1.7–7.7)
Neutrophils Relative %: 77 %
PLATELETS: 183 10*3/uL (ref 150–400)
RBC: 2.23 MIL/uL — ABNORMAL LOW (ref 4.22–5.81)
RDW: 20.1 % — ABNORMAL HIGH (ref 11.5–15.5)
WBC: 11.9 10*3/uL — ABNORMAL HIGH (ref 4.0–10.5)
nRBC: 0 % (ref 0.0–0.2)

## 2018-08-05 LAB — BRAIN NATRIURETIC PEPTIDE: B NATRIURETIC PEPTIDE 5: 1338.4 pg/mL — AB (ref 0.0–100.0)

## 2018-08-05 LAB — TROPONIN I
TROPONIN I: 0.06 ng/mL — AB (ref <0.03)
Troponin I: 0.05 ng/mL (ref <0.03)

## 2018-08-05 LAB — PROCALCITONIN: Procalcitonin: 4.75 ng/mL

## 2018-08-05 LAB — SEDIMENTATION RATE: Sed Rate: 64 mm/hr — ABNORMAL HIGH (ref 0–16)

## 2018-08-05 LAB — POC OCCULT BLOOD, ED: Fecal Occult Bld: POSITIVE — AB

## 2018-08-05 LAB — LACTIC ACID, PLASMA
Lactic Acid, Venous: 1 mmol/L (ref 0.5–1.9)
Lactic Acid, Venous: 0.9 mmol/L (ref 0.5–1.9)

## 2018-08-05 LAB — C-REACTIVE PROTEIN: CRP: 10.5 mg/dL — ABNORMAL HIGH (ref <1.0)

## 2018-08-05 LAB — MRSA PCR SCREENING: MRSA BY PCR: NEGATIVE

## 2018-08-05 MED ORDER — ALBUTEROL SULFATE HFA 108 (90 BASE) MCG/ACT IN AERS
8.0000 | INHALATION_SPRAY | Freq: Once | RESPIRATORY_TRACT | Status: AC
  Administered 2018-08-05: 8 via RESPIRATORY_TRACT
  Filled 2018-08-05: qty 6.7

## 2018-08-05 MED ORDER — COLCHICINE 0.6 MG PO TABS
0.6000 mg | ORAL_TABLET | Freq: Two times a day (BID) | ORAL | Status: DC
  Administered 2018-08-05 – 2018-08-06 (×3): 0.6 mg via ORAL
  Filled 2018-08-05 (×3): qty 1

## 2018-08-05 MED ORDER — METHYLPREDNISOLONE SODIUM SUCC 125 MG IJ SOLR
125.0000 mg | Freq: Once | INTRAMUSCULAR | Status: AC
  Administered 2018-08-05: 125 mg via INTRAVENOUS

## 2018-08-05 MED ORDER — PANTOPRAZOLE SODIUM 40 MG IV SOLR
40.0000 mg | Freq: Once | INTRAVENOUS | Status: AC
  Administered 2018-08-05: 40 mg via INTRAVENOUS
  Filled 2018-08-05: qty 40

## 2018-08-05 MED ORDER — FUROSEMIDE 10 MG/ML IJ SOLN
80.0000 mg | Freq: Once | INTRAMUSCULAR | Status: AC
  Administered 2018-08-05: 80 mg via INTRAVENOUS
  Filled 2018-08-05: qty 8

## 2018-08-05 MED ORDER — SODIUM CHLORIDE 0.9 % IV SOLN
2.0000 g | Freq: Once | INTRAVENOUS | Status: AC
  Administered 2018-08-05: 2 g via INTRAVENOUS
  Filled 2018-08-05: qty 2

## 2018-08-05 MED ORDER — ISOSORBIDE DINITRATE 10 MG PO TABS
40.0000 mg | ORAL_TABLET | Freq: Three times a day (TID) | ORAL | Status: DC
  Administered 2018-08-05 – 2018-08-09 (×12): 40 mg via ORAL
  Filled 2018-08-05: qty 2
  Filled 2018-08-05 (×2): qty 4
  Filled 2018-08-05 (×3): qty 2
  Filled 2018-08-05 (×5): qty 4
  Filled 2018-08-05: qty 2
  Filled 2018-08-05 (×2): qty 4
  Filled 2018-08-05: qty 2
  Filled 2018-08-05: qty 4

## 2018-08-05 MED ORDER — CARVEDILOL 25 MG PO TABS
50.0000 mg | ORAL_TABLET | Freq: Two times a day (BID) | ORAL | Status: DC
  Administered 2018-08-05 – 2018-08-09 (×8): 50 mg via ORAL
  Filled 2018-08-05 (×8): qty 2

## 2018-08-05 MED ORDER — AEROCHAMBER PLUS FLO-VU LARGE MISC
1.0000 | Freq: Once | Status: AC
  Administered 2018-08-05: 1

## 2018-08-05 MED ORDER — VANCOMYCIN HCL 10 G IV SOLR
1250.0000 mg | Freq: Once | INTRAVENOUS | Status: AC
  Administered 2018-08-05: 1250 mg via INTRAVENOUS
  Filled 2018-08-05: qty 1250

## 2018-08-05 MED ORDER — MAGNESIUM SULFATE 2 GM/50ML IV SOLN
2.0000 g | Freq: Once | INTRAVENOUS | Status: AC
  Administered 2018-08-05: 2 g via INTRAVENOUS

## 2018-08-05 MED ORDER — FUROSEMIDE 20 MG PO TABS
20.0000 mg | ORAL_TABLET | Freq: Every day | ORAL | Status: DC
  Administered 2018-08-05 – 2018-08-06 (×2): 20 mg via ORAL
  Filled 2018-08-05 (×2): qty 1

## 2018-08-05 MED ORDER — ASPIRIN 81 MG PO CHEW
81.0000 mg | CHEWABLE_TABLET | Freq: Every day | ORAL | Status: DC
  Administered 2018-08-06: 81 mg via ORAL
  Filled 2018-08-05: qty 1

## 2018-08-05 MED ORDER — SODIUM CHLORIDE 0.9 % IV SOLN
10.0000 mL/h | Freq: Once | INTRAVENOUS | Status: AC
  Administered 2018-08-05: 10 mL/h via INTRAVENOUS

## 2018-08-05 MED ORDER — HYDRALAZINE HCL 50 MG PO TABS
100.0000 mg | ORAL_TABLET | Freq: Three times a day (TID) | ORAL | Status: DC
  Administered 2018-08-05 – 2018-08-09 (×11): 100 mg via ORAL
  Filled 2018-08-05 (×9): qty 4
  Filled 2018-08-05: qty 2
  Filled 2018-08-05 (×4): qty 4

## 2018-08-05 MED ORDER — SODIUM CHLORIDE 0.9 % IV SOLN
80.0000 mg | Freq: Once | INTRAVENOUS | Status: AC
  Administered 2018-08-05: 80 mg via INTRAVENOUS
  Filled 2018-08-05: qty 80

## 2018-08-05 MED ORDER — FENTANYL CITRATE (PF) 100 MCG/2ML IJ SOLN
50.0000 ug | Freq: Once | INTRAMUSCULAR | Status: AC
  Administered 2018-08-05: 50 ug via INTRAVENOUS

## 2018-08-05 MED ORDER — ALBUTEROL SULFATE HFA 108 (90 BASE) MCG/ACT IN AERS
8.0000 | INHALATION_SPRAY | Freq: Once | RESPIRATORY_TRACT | Status: AC
  Administered 2018-08-05: 8 via RESPIRATORY_TRACT

## 2018-08-05 MED ORDER — FENTANYL CITRATE (PF) 100 MCG/2ML IJ SOLN
INTRAMUSCULAR | Status: AC
  Filled 2018-08-05: qty 2

## 2018-08-05 MED ORDER — MAGNESIUM SULFATE 2 GM/50ML IV SOLN
2.0000 g | Freq: Once | INTRAVENOUS | Status: DC
  Filled 2018-08-05: qty 50

## 2018-08-05 MED ORDER — METHYLPREDNISOLONE SODIUM SUCC 125 MG IJ SOLR
125.0000 mg | Freq: Once | INTRAMUSCULAR | Status: DC
  Filled 2018-08-05: qty 2

## 2018-08-05 MED ORDER — SODIUM CHLORIDE 0.9 % IV SOLN
8.0000 mg/h | INTRAVENOUS | Status: DC
  Administered 2018-08-05 – 2018-08-06 (×3): 8 mg/h via INTRAVENOUS
  Filled 2018-08-05 (×5): qty 80

## 2018-08-05 MED ORDER — VANCOMYCIN HCL IN DEXTROSE 750-5 MG/150ML-% IV SOLN: 750.0000 mg | INTRAVENOUS | Status: DC

## 2018-08-05 MED ORDER — SODIUM CHLORIDE 0.9 % IV SOLN
1.0000 g | INTRAVENOUS | Status: DC
  Administered 2018-08-06 – 2018-08-09 (×4): 1 g via INTRAVENOUS
  Filled 2018-08-05 (×5): qty 1

## 2018-08-05 MED ORDER — VANCOMYCIN HCL IN DEXTROSE 1-5 GM/200ML-% IV SOLN: 1000.0000 mg | Freq: Once | INTRAVENOUS | Status: DC

## 2018-08-05 MED ORDER — LORAZEPAM 1 MG PO TABS
0.5000 mg | ORAL_TABLET | Freq: Once | ORAL | Status: AC
  Administered 2018-08-05: 0.5 mg via ORAL
  Filled 2018-08-05: qty 1

## 2018-08-05 MED ORDER — ACETAMINOPHEN 325 MG PO TABS
650.0000 mg | ORAL_TABLET | ORAL | Status: DC | PRN
  Administered 2018-08-05: 650 mg via ORAL
  Filled 2018-08-05: qty 2

## 2018-08-05 MED ORDER — PANTOPRAZOLE SODIUM 40 MG IV SOLR
40.0000 mg | Freq: Two times a day (BID) | INTRAVENOUS | Status: DC
  Administered 2018-08-08 – 2018-08-09 (×3): 40 mg via INTRAVENOUS
  Filled 2018-08-05 (×3): qty 40
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
## 2018-08-05 NOTE — ED Notes (Signed)
Pt name on labels and in chart is his twin brother's name.

## 2018-08-05 NOTE — Progress Notes (Signed)
Pharmacy Antibiotic Note  [name] is a 73 y.o. male admitted on 08/05/2018 with pneumonia.  Pharmacy has been consulted for vancomycin/cefepime dosing. Afebrile, WBC 11.9, LA 1. SCr 4.02 on admit (baseline ~3.1-3.4), CrCl~10-15.  Plan: Cefepime 2g IV x 1; then 1g IV q24h Vancomycin 1250mg  IV x1; then Vancomycin 750 mg IV Q 48 hrs. Goal AUC 400-550. Expected AUC: 526 SCr used: 4.02 Monitor clinical progress, c/s, renal function F/u de-escalation plan/LOT, vancomycin levels as indicated      No data recorded.  No results for input(s): WBC, CREATININE, LATICACIDVEN, VANCOTROUGH, VANCOPEAK, VANCORANDOM, GENTTROUGH, GENTPEAK, GENTRANDOM, TOBRATROUGH, TOBRAPEAK, TOBRARND, AMIKACINPEAK, AMIKACINTROU, AMIKACIN in the last 168 hours.  CrCl cannot be calculated (Patient's most recent lab result is older than the maximum 21 days allowed.).    Allergies  Allergen Reactions  . Iohexol Hives, Itching and Other (See Comments)    Patient has itching and hives, needs 13 hour prep    Antimicrobials this admission: 3/29 vancomycin >>  3/29 cefepime >>   Dose adjustments this admission:   Microbiology results:   Elicia Lamp, PharmD, BCPS Clinical Pharmacist 08/05/2018 8:24 AM

## 2018-08-05 NOTE — ED Notes (Signed)
Trop + 0.06 per lab

## 2018-08-05 NOTE — ED Notes (Signed)
Pt given 4 puffs of inhaler with spacer per Dr Regenia Skeeter .  O2 increased from 2L to 4L for increased sob and labored breathing.
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
## 2018-08-05 NOTE — Progress Notes (Signed)
New pt admitted from ED with a diagnosis of dyspnea, Oxygen is maintained at 96 % with 4l/[place]. Pt is expressing to eat, RN made him aware of troponin high and needs to be in NPO per order. BP was super high but trending down latest one is 141/72 Second unit blood running this time, second troponin is 0.05, made MD aware Will continue to monitor  Palma Holter, RN

## 2018-08-05 NOTE — ED Notes (Signed)
Pt c/o L knee.  States he caught his knee bw the knight stand and his bed.  MD notified.

## 2018-08-06 ENCOUNTER — Other Ambulatory Visit: Payer: Self-pay

## 2018-08-06 ENCOUNTER — Ambulatory Visit: Payer: Self-pay | Admitting: Nurse Practitioner

## 2018-08-06 ENCOUNTER — Inpatient Hospital Stay (HOSPITAL_COMMUNITY): Payer: Medicare Other

## 2018-08-06 ENCOUNTER — Telehealth: Payer: Self-pay | Admitting: *Deleted

## 2018-08-06 DIAGNOSIS — N179 Acute kidney failure, unspecified: Secondary | ICD-10-CM

## 2018-08-06 DIAGNOSIS — I10 Essential (primary) hypertension: Secondary | ICD-10-CM

## 2018-08-06 DIAGNOSIS — N183 Chronic kidney disease, stage 3 (moderate): Secondary | ICD-10-CM

## 2018-08-06 DIAGNOSIS — I5033 Acute on chronic diastolic (congestive) heart failure: Secondary | ICD-10-CM

## 2018-08-06 DIAGNOSIS — R0603 Acute respiratory distress: Secondary | ICD-10-CM

## 2018-08-06 DIAGNOSIS — I340 Nonrheumatic mitral (valve) insufficiency: Secondary | ICD-10-CM

## 2018-08-06 DIAGNOSIS — I361 Nonrheumatic tricuspid (valve) insufficiency: Secondary | ICD-10-CM

## 2018-08-06 LAB — CBC
HCT: 22.2 % — ABNORMAL LOW (ref 39.0–52.0)
Hemoglobin: 7.2 g/dL — ABNORMAL LOW (ref 13.0–17.0)
MCH: 25.4 pg — ABNORMAL LOW (ref 26.0–34.0)
MCHC: 32.4 g/dL (ref 30.0–36.0)
MCV: 78.4 fL — ABNORMAL LOW (ref 80.0–100.0)
Platelets: 175 10*3/uL (ref 150–400)
RBC: 2.83 MIL/uL — ABNORMAL LOW (ref 4.22–5.81)
RDW: 17.6 % — ABNORMAL HIGH (ref 11.5–15.5)
WBC: 11.1 10*3/uL — ABNORMAL HIGH (ref 4.0–10.5)
nRBC: 0 % (ref 0.0–0.2)

## 2018-08-06 LAB — ECHOCARDIOGRAM LIMITED
Height: 68.5 in
Weight: 2320 oz

## 2018-08-06 LAB — BASIC METABOLIC PANEL
Anion gap: 10 (ref 5–15)
BUN: 57 mg/dL — AB (ref 8–23)
CHLORIDE: 111 mmol/L (ref 98–111)
CO2: 17 mmol/L — ABNORMAL LOW (ref 22–32)
Calcium: 8.1 mg/dL — ABNORMAL LOW (ref 8.9–10.3)
Creatinine, Ser: 3.99 mg/dL — ABNORMAL HIGH (ref 0.61–1.24)
GFR calc Af Amer: 16 mL/min — ABNORMAL LOW (ref >60)
GFR calc non Af Amer: 14 mL/min — ABNORMAL LOW (ref >60)
Glucose, Bld: 138 mg/dL — ABNORMAL HIGH (ref 70–99)
Potassium: 4.4 mmol/L (ref 3.5–5.1)
Sodium: 138 mmol/L (ref 135–145)

## 2018-08-06 LAB — IRON AND TIBC
Iron: 10 ug/dL — ABNORMAL LOW (ref 45–182)
Saturation Ratios: 4 % — ABNORMAL LOW (ref 17.9–39.5)
TIBC: 260 ug/dL (ref 250–450)
UIBC: 250 ug/dL

## 2018-08-06 LAB — FERRITIN: Ferritin: 96 ng/mL (ref 24–336)

## 2018-08-06 MED ORDER — NICOTINE 14 MG/24HR TD PT24
14.0000 mg | MEDICATED_PATCH | Freq: Every day | TRANSDERMAL | Status: DC
  Administered 2018-08-06 – 2018-08-09 (×4): 14 mg via TRANSDERMAL
  Filled 2018-08-06 (×4): qty 1

## 2018-08-06 MED ORDER — LEVETIRACETAM 500 MG PO TABS
500.0000 mg | ORAL_TABLET | Freq: Two times a day (BID) | ORAL | Status: DC
  Administered 2018-08-06 – 2018-08-09 (×7): 500 mg via ORAL
  Filled 2018-08-06 (×7): qty 1

## 2018-08-06 MED ORDER — ORAL CARE MOUTH RINSE
15.0000 mL | Freq: Two times a day (BID) | OROMUCOSAL | Status: DC
  Administered 2018-08-06 – 2018-08-08 (×3): 15 mL via OROMUCOSAL

## 2018-08-06 MED ORDER — LABETALOL HCL 5 MG/ML IV SOLN: 10.0000 mg | INTRAVENOUS | Status: DC | PRN

## 2018-08-06 MED ORDER — OLANZAPINE 2.5 MG PO TABS
2.5000 mg | ORAL_TABLET | Freq: Every day | ORAL | Status: DC
  Administered 2018-08-06 – 2018-08-08 (×3): 2.5 mg via ORAL
  Filled 2018-08-06 (×4): qty 1

## 2018-08-06 MED ORDER — AMLODIPINE BESYLATE 10 MG PO TABS
10.0000 mg | ORAL_TABLET | Freq: Every day | ORAL | Status: DC
  Administered 2018-08-06 – 2018-08-09 (×4): 10 mg via ORAL
  Filled 2018-08-06 (×4): qty 1

## 2018-08-06 MED ORDER — SODIUM CHLORIDE 0.9 % IV SOLN
INTRAVENOUS | Status: DC
  Administered 2018-08-06 – 2018-08-07 (×2): via INTRAVENOUS

## 2018-08-06 MED ORDER — CLONIDINE HCL 0.1 MG PO TABS
0.1000 mg | ORAL_TABLET | Freq: Three times a day (TID) | ORAL | Status: DC
  Administered 2018-08-06 – 2018-08-07 (×4): 0.1 mg via ORAL
  Filled 2018-08-06 (×4): qty 1

## 2018-08-06 MED ORDER — FUROSEMIDE 10 MG/ML IJ SOLN: 40.0000 mg | Freq: Two times a day (BID) | INTRAMUSCULAR | Status: DC

## 2018-08-06 MED ORDER — FUROSEMIDE 10 MG/ML IJ SOLN: 80.0000 mg | Freq: Every day | INTRAMUSCULAR | Status: DC

## 2018-08-06 MED ORDER — FUROSEMIDE 10 MG/ML IJ SOLN
80.0000 mg | Freq: Once | INTRAMUSCULAR | Status: AC
  Administered 2018-08-06: 80 mg via INTRAVENOUS
  Filled 2018-08-06: qty 8

## 2018-08-06 MED ORDER — IPRATROPIUM-ALBUTEROL 0.5-2.5 (3) MG/3ML IN SOLN
3.0000 mL | Freq: Four times a day (QID) | RESPIRATORY_TRACT | Status: DC | PRN
  Administered 2018-08-07: 3 mL via RESPIRATORY_TRACT
  Filled 2018-08-06: qty 3

## 2018-08-06 NOTE — Telephone Encounter (Signed)
Patient called to report he will not be able to make his appointment on 08/07/18. Currently an inpatient at [facility] 2C07).

## 2018-08-06 NOTE — Progress Notes (Signed)
  Echocardiogram 2D Echocardiogram has been performed.  Jennette Dubin 08/06/2018, 12:14 PM
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
## 2018-08-06 NOTE — Progress Notes (Signed)
Patient doesn't have cough or fever but it was put in on droplet precaution through the night. Talked infection prevention team, but better to talk to infection disease doctor. Talked Dr. Megan Salon regarding isolation of low risk of COVID- 19 with patient conditions. Dr. Megan Salon advised not to put on the isolation according pt's symptoms. Discontinued droplet precaution. HS [employer]
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
## 2018-08-06 NOTE — Anesthesia Preprocedure Evaluation (Addendum)
Anesthesia Evaluation  Patient identified by MRN, date of birth, ID band Patient awake and Patient confused    Reviewed: Allergy & Precautions, NPO status , Patient's Chart, lab work & pertinent test results, reviewed documented beta blocker date and time   Airway Mallampati: II  TM Distance: >3 FB Neck ROM: Full    Dental  (+) Dental Advisory Given, Partial Upper, Partial Lower   Pulmonary shortness of breath, pneumonia, unresolved, COPD,  COPD inhaler, former smoker,    Pulmonary exam normal breath sounds clear to auscultation       Cardiovascular hypertension, Pt. on home beta blockers and Pt. on medications + Past MI  Normal cardiovascular exam Rhythm:Regular Rate:Normal     Neuro/Psych Seizures -, Well Controlled,  PSYCHIATRIC DISORDERS Anxiety Depression CVA    GI/Hepatic GERD  Medicated,H/o pancreatitis  GIB   Endo/Other  negative endocrine ROS  Renal/GU Renal InsufficiencyRenal disease     Musculoskeletal  (+) Arthritis ,   Abdominal   Peds  Hematology  (+) Blood dyscrasia, anemia , H/o leukemia   Anesthesia Other Findings Day of surgery medications reviewed with the patient.  Reproductive/Obstetrics                           Anesthesia Physical Anesthesia Plan  ASA: III  Anesthesia Plan: MAC   Post-op Pain Management:    Induction: Intravenous  PONV Risk Score and Plan: 1 and Propofol infusion and Treatment may vary due to age or medical condition  Airway Management Planned: Natural Airway and Nasal Cannula  Additional Equipment:   Intra-op Plan:   Post-operative Plan:   Informed Consent: I have reviewed the patients History and Physical, chart, labs and discussed the procedure including the risks, benefits and alternatives for the proposed anesthesia with the patient or authorized representative who has indicated his/her understanding and acceptance.     Dental  advisory given  Plan Discussed with: CRNA  Anesthesia Plan Comments:        Anesthesia Quick Evaluation

## 2018-08-06 NOTE — Plan of Care (Signed)
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
## 2018-08-07 ENCOUNTER — Inpatient Hospital Stay (HOSPITAL_COMMUNITY): Payer: Medicare Other | Admitting: Anesthesiology

## 2018-08-07 ENCOUNTER — Encounter (HOSPITAL_COMMUNITY)
Admission: EM | Disposition: A | Discharge status: Home / Self Care | Payer: Self-pay | Source: Home / Self Care | Attending: Internal Medicine

## 2018-08-07 ENCOUNTER — Encounter (HOSPITAL_COMMUNITY): Payer: Self-pay | Admitting: *Deleted

## 2018-08-07 DIAGNOSIS — I501 Left ventricular failure: Secondary | ICD-10-CM

## 2018-08-07 HISTORY — PX: ESOPHAGOGASTRODUODENOSCOPY (EGD) WITH PROPOFOL: SHX5813

## 2018-08-07 LAB — CBC WITH DIFFERENTIAL/PLATELET
Abs Immature Granulocytes: 0.05 10*3/uL (ref 0.00–0.07)
Basophils Absolute: 0 10*3/uL (ref 0.0–0.1)
Basophils Relative: 0 %
Eosinophils Absolute: 0 10*3/uL (ref 0.0–0.5)
Eosinophils Relative: 0 %
HCT: 21 % — ABNORMAL LOW (ref 39.0–52.0)
Hemoglobin: 6.6 g/dL — CL (ref 13.0–17.0)
Immature Granulocytes: 1 %
Lymphocytes Relative: 8 %
Lymphs Abs: 0.8 10*3/uL (ref 0.7–4.0)
MCH: 24.5 pg — ABNORMAL LOW (ref 26.0–34.0)
MCHC: 31.4 g/dL (ref 30.0–36.0)
MCV: 78.1 fL — ABNORMAL LOW (ref 80.0–100.0)
MONO ABS: 1 10*3/uL (ref 0.1–1.0)
Monocytes Relative: 10 %
Neutro Abs: 7.9 10*3/uL — ABNORMAL HIGH (ref 1.7–7.7)
Neutrophils Relative %: 81 %
Platelets: 171 10*3/uL (ref 150–400)
RBC: 2.69 MIL/uL — ABNORMAL LOW (ref 4.22–5.81)
RDW: 18.1 % — ABNORMAL HIGH (ref 11.5–15.5)
WBC: 9.8 10*3/uL (ref 4.0–10.5)
nRBC: 0 % (ref 0.0–0.2)

## 2018-08-07 LAB — BASIC METABOLIC PANEL
Anion gap: 10 (ref 5–15)
BUN: 59 mg/dL — ABNORMAL HIGH (ref 8–23)
CO2: 19 mmol/L — AB (ref 22–32)
Calcium: 7.8 mg/dL — ABNORMAL LOW (ref 8.9–10.3)
Chloride: 109 mmol/L (ref 98–111)
Creatinine, Ser: 3.92 mg/dL — ABNORMAL HIGH (ref 0.61–1.24)
GFR calc Af Amer: 17 mL/min — ABNORMAL LOW (ref >60)
GFR calc non Af Amer: 14 mL/min — ABNORMAL LOW (ref >60)
Glucose, Bld: 97 mg/dL (ref 70–99)
Potassium: 3.6 mmol/L (ref 3.5–5.1)
Sodium: 138 mmol/L (ref 135–145)

## 2018-08-07 LAB — PREPARE RBC (CROSSMATCH)

## 2018-08-07 SURGERY — ESOPHAGOGASTRODUODENOSCOPY (EGD) WITH PROPOFOL
Anesthesia: Monitor Anesthesia Care

## 2018-08-07 MED ORDER — SODIUM CHLORIDE 0.9% IV SOLUTION
Freq: Once | INTRAVENOUS | Status: AC
  Administered 2018-08-07: 06:00:00 via INTRAVENOUS

## 2018-08-07 MED ORDER — LIDOCAINE 2% (20 MG/ML) 5 ML SYRINGE
INTRAMUSCULAR | Status: DC | PRN
  Administered 2018-08-07: 60 mg via INTRAVENOUS

## 2018-08-07 MED ORDER — SODIUM CHLORIDE 0.9% IV SOLUTION: Freq: Once | INTRAVENOUS | Status: DC

## 2018-08-07 MED ORDER — CLONIDINE HCL 0.2 MG PO TABS
0.2000 mg | ORAL_TABLET | Freq: Three times a day (TID) | ORAL | Status: DC
  Administered 2018-08-07 – 2018-08-09 (×6): 0.2 mg via ORAL
  Filled 2018-08-07 (×6): qty 1

## 2018-08-07 MED ORDER — SODIUM CHLORIDE 0.9 % IV SOLN
INTRAVENOUS | Status: DC
  Administered 2018-08-07 – 2018-08-08 (×2): via INTRAVENOUS

## 2018-08-07 MED ORDER — PROPOFOL 10 MG/ML IV BOLUS
INTRAVENOUS | Status: DC | PRN
  Administered 2018-08-07: 30 mg via INTRAVENOUS
  Administered 2018-08-07: 70 mg via INTRAVENOUS

## 2018-08-07 MED ORDER — PEG 3350-KCL-NA BICARB-NACL 420 G PO SOLR
4000.0000 mL | Freq: Once | ORAL | Status: AC
  Administered 2018-08-07: 4000 mL via ORAL
  Filled 2018-08-07: qty 4000

## 2018-08-07 SURGICAL SUPPLY — 15 items
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
## 2018-08-07 NOTE — Transfer of Care (Signed)
Immediate Anesthesia Transfer of Care Note  Patient: [name]  Procedure(s) Performed: ESOPHAGOGASTRODUODENOSCOPY (EGD) WITH PROPOFOL (N/A )  Patient Location: Endoscopy Unit  Anesthesia Type:MAC  Level of Consciousness: drowsy and patient cooperative  Airway & Oxygen Therapy: Patient Spontanous Breathing and Patient connected to nasal cannula oxygen  Post-op Assessment: Report given to RN and Post -op Vital signs reviewed and stable  Post vital signs: Reviewed and stable  Last Vitals:  Vitals Value Taken Time  BP 158/67 08/07/2018  8:15 AM  Temp    Pulse 71 08/07/2018  8:15 AM  Resp 22 08/07/2018  8:15 AM  SpO2 89 % 08/07/2018  8:15 AM  Vitals shown include unvalidated device data.  Last Pain:  Vitals:   08/07/18 0735  TempSrc: Oral  PainSc:          Complications: No apparent anesthesia complications

## 2018-08-07 NOTE — Progress Notes (Signed)
Progress Note  Patient Name: [name] Date of Encounter: 08/07/2018  Primary Cardiologist: Candee Furbish, MD   Subjective   Hgb with slight downward change, now 6.6. Creatinine stable around 4.0. Had EGD - no bleeding source. Plan for colonoscopy tomorrow. Denies dyspnea (able to lie flat) or angina, feels cold and wants real food.  Inpatient Medications    Scheduled Meds: . sodium chloride   Intravenous Once  . amLODipine  10 mg Oral Daily  . carvedilol  50 mg Oral BID WC  . cloNIDine  0.1 mg Oral TID  . hydrALAZINE  100 mg Oral TID  . isosorbide dinitrate  40 mg Oral TID  . levETIRAcetam  500 mg Oral BID  . mouth rinse  15 mL Mouth Rinse BID  . nicotine  14 mg Transdermal Daily  . OLANZapine  2.5 mg Oral QHS  . [START ON 08/08/2018] pantoprazole  40 mg Intravenous Q12H  . polyethylene glycol-electrolytes  4,000 mL Oral Once   Continuous Infusions: . sodium chloride    . ceFEPime (MAXIPIME) IV 1 g (08/06/18 1209)  . pantoprozole (PROTONIX) infusion 8 mg/hr (08/06/18 1213)   PRN Meds: acetaminophen, ipratropium-albuterol, labetalol   Vital Signs    Vitals:   08/07/18 0735 08/07/18 0815 08/07/18 0825 08/07/18 0844  BP: (!) 169/74 (!) 158/67 (!) 173/75 (!) 170/90  Pulse: 70 69 66 68  Resp: (!) 22 (!) 24 (!) 23 17  Temp: 99.1 F (37.3 C) 97.6 F (36.4 C)  98.4 F (36.9 C)  TempSrc: Oral Oral  Oral  SpO2: 94% 94% 94% 94%  Weight:      Height:        Intake/Output Summary (Last 24 hours) at 08/07/2018 0933 Last data filed at 08/07/2018 0810 Gross per 24 hour  Intake 1215.5 ml  Output 1725 ml  Net -509.5 ml   Last 3 Weights 08/05/2018 08/02/2018 07/05/2018  Weight (lbs) 145 lb 126 lb 126 lb 1.7 oz  Weight (kg) 65.772 kg 57.153 kg 57.2 kg      Telemetry    NSR - Personally Reviewed  ECG    No new tracing  - Personally Reviewed  Physical Exam  Comfortable lying flat GEN: No acute distress.   Neck: No JVD Cardiac: RRR, no murmurs, rubs, or gallops.   Respiratory: Clear to auscultation bilaterally. GI: Soft, nontender, non-distended  MS: No edema; s/p R AKA Neuro:  Nonfocal  Psych: Normal affect   Labs    Chemistry Recent Labs  Lab 08/05/18 0745 08/06/18 0823 08/07/18 0234  NA 140 138 138  K 5.3* 4.4 3.6  CL 114* 111 109  CO2 16* 17* 19*  GLUCOSE 111* 138* 97  BUN 54* 57* 59*  CREATININE 4.02* 3.99* 3.92*  CALCIUM 8.2* 8.1* 7.8*  PROT 5.9*  --   --   ALBUMIN 2.6*  --   --   AST 43*  --   --   ALT 41  --   --   ALKPHOS 91  --   --   BILITOT 0.7  --   --   GFRNONAA 14* 14* 14*  GFRAA 16* 16* 17*  ANIONGAP 10 10 10      Hematology Recent Labs  Lab 08/05/18 0745 08/06/18 0629 08/07/18 0234  WBC 11.9* 11.1* 9.8  RBC 2.23* 2.83* 2.69*  HGB 5.2* 7.2* 6.6*  HCT 18.1* 22.2* 21.0*  MCV 81.2 78.4* 78.1*  MCH 23.3* 25.4* 24.5*  MCHC 28.7* 32.4 31.4  RDW 20.1* 17.6* 18.1*  PLT 183 175 171    Cardiac Enzymes Recent Labs  Lab 08/05/18 0745 08/05/18 1531  TROPONINI 0.06* 0.05*   No results for input(s): TROPIPOC in the last 168 hours.   BNP Recent Labs  Lab 08/05/18 0745  BNP 1,338.4*     DDimer No results for input(s): DDIMER in the last 168 hours.   Radiology    Dg Knee Left Port  Result Date: 08/05/2018 CLINICAL DATA:  Left knee injury and pain. Chest pain and shortness of breath. EXAM: PORTABLE LEFT KNEE - 1-2 VIEW COMPARISON:  02/07/2018 FINDINGS: Frontal and oblique views of the knee were obtained. A lateral view was not obtained at the discretion of the requesting physician. No appreciable fracture is observed on these three views. The patella is obscured by metal artifact from the femoral component of the prosthesis. Extensive vascular calcifications noted. IMPRESSION: 1. No well-defined cortical discontinuity on the provided three views to suggest fracture. Patella not visualized. 2. Extensive atherosclerosis. Electronically Signed   By: Van Clines M.D.   On: 08/05/2018 10:58    Cardiac  Studies  Echo 08/06/18: 1. The left ventricle has mildly reduced systolic function, with an ejection fraction of 45-50%. The cavity size was mildly dilated. There is moderately increased left ventricular wall thickness. Left ventricular diastolic parameters were normal. 2. Abnormal septal motion inferior basal hypokinesis. 3. The right ventricle has normal systolic function. The cavity was normal. There is no increase in right ventricular wall thickness. 4. Mild thickening of the mitral valve leaflet. Mild calcification of the mitral valve leaflet. Mitral valve regurgitation is mild to moderate by color flow Doppler. 5. The aortic valve is tricuspid. Mild thickening of the aortic valve. Moderate calcification of the aortic valve.  Echocardiogram 06/21/2018 IMPRESSIONS 1. The left ventricle has normal systolic function with an ejection fraction of 60-65%. The cavity size was normal. There is severely increased left ventricular wall thickness. Left ventricular diastolic Doppler parameters are consistent with impaired  relaxation. 2. The right ventricle has normal systolic function. The cavity was normal. There is no increase in right ventricular wall thickness. 3. The mitral valve is normal in structure. There is mild thickening. 4. The tricuspid valve is normal in structure. 5. The aortic valve is tricuspid There is mild thickening of the aortic valve. 6. The pulmonic valve was normal in structure. 7. Normal LV function; mild diastolic dysfunction; severe LVH. 8. Right atrial pressure is estimated at 8 mmHg.   Carotid Dopplers 06/21/2018 Summary: Right Carotid: Velocities in the right ICA are consistent with a 1-39% stenosis. Left Carotid: Velocities in the left ICA are consistent with a 40-59% stenosis. Vertebrals: Right vertebral artery demonstrates antegrade flow. Left vertebral artery was not visualized. Subclavians: Left subclavian artery was not visualized. Normal flow  hemodynamics were seen in the right subclavian artery.  Nuclear stress test 09/2016: IMPRESSION: 1. No reversible ischemia. Fixed defect involving the inferior wall compatible with history of prior myocardial infarction. 2. Lateral wall hypokinesis. 3. Left ventricular ejection fraction 51  Patient Profile     73 y.o. male with a hx of HTN, CKD, HLD, CML, tobacco abuse, COPD, R AKA chronic anemia with superimposed acute blood loss anemia and evidence of GI bleeding, with worsening LVEF by echo.  Assessment & Plan    1. LV systolic dysfunction: HTN is clearly a major factor. Cannot exclude contribution of CAD, but he does not have angina, has a recent nuclear perfusion study without ischemia and is not a candidate for  invasive evaluation due to severe anemia with active bleeding and acute on chronic severe renal insufficiency. For same reason, not a candidate for RAAS inhibitors. Would avoid diuretics unless he is dyspneic or clearly hypervolemic by exam, which is not currently the case. 2. Malignant HTN: on multiple drugs in high doses. Agree that increasing clonidine will be the next step. Only other option that I see is switching hydralazine to minoxidil. Renal duplex US is considered, but I do not think it would change the course of his care - cannot give ACEi/ARB/spironolactone or stent his renal artery even if stenosis is present. 3. Acute on CKD: I suspect he may be heading towards ESRD before the end of the year. 4. Anemia:  Multifactorial, but GI bleeding/iron deficiency is clearly part of the problem (also probably CML and CKD-related EOPO deficiency). 5. CAD: reports a coronary stent (unknown vessel, 25-30 years ago), but does not have angina.     For questions or updates, please contact [place] Please consult [email or web address] for contact info under        Signed, Sanda Klein, MD  08/07/2018, 9:33 AM

## 2018-08-07 NOTE — Op Note (Signed)
[facility] Patient Name: [name] Procedure Date : 08/07/2018 MRN: [id number] Attending MD: Carol Ada , MD Date of Birth: 01-27-1946 CSN: [id number] Age: 73 Admit Type: Inpatient Procedure:                Upper GI endoscopy Indications:              Iron deficiency anemia Providers:                Carol Ada, MD, Zenon Mayo, RN, William Dalton, Technician, Ladona Ridgel, Technician Referring MD:              Medicines:                Propofol per Anesthesia Complications:            No immediate complications. Estimated Blood Loss:     Estimated blood loss: none. Procedure:                Pre-Anesthesia Assessment:                           - Prior to the procedure, a History and Physical                            was performed, and patient medications and                            allergies were reviewed. The patient's tolerance of                            previous anesthesia was also reviewed. The risks                            and benefits of the procedure and the sedation                            options and risks were discussed with the patient.                            All questions were answered, and informed consent                            was obtained. Prior Anticoagulants: The patient has                            taken no previous anticoagulant or antiplatelet                            agents. ASA Grade Assessment: III - A patient with                            severe systemic disease. After reviewing the risks  and benefits, the patient was deemed in                            satisfactory condition to undergo the procedure.                           - Sedation was administered by an anesthesia                            professional. Deep sedation was attained.                           After obtaining informed consent, the endoscope was                            passed  under direct vision. Throughout the                            procedure, the patient's blood pressure, pulse, and                            oxygen saturations were monitored continuously. The                            [id number]-H190 ([id number]) Olympus gastroscope was                            introduced through the mouth, and advanced to the                            fourth part of duodenum. The upper GI endoscopy was                            accomplished without difficulty. The patient                            tolerated the procedure well. Scope In: Scope Out: Findings:      The esophagus was normal.      Striped mildly erythematous mucosa without bleeding was found in the       gastric antrum.      The examined duodenum was normal. Impression:               - Normal esophagus.                           - Erythematous mucosa in the antrum.                           - Normal examined duodenum.                           - No specimens collected. Recommendation:           - Return patient to hospital ward for ongoing care.                           -  Clear liquid diet.                           - Continue present medications.                           - Colonoscopy with Dr. Collene Mares tomorrow. Procedure Code(s):        --- Professional ---                           [phone], Esophagogastroduodenoscopy, flexible,                            transoral; diagnostic, including collection of                            specimen(s) by brushing or washing, when performed                            (separate procedure) Diagnosis Code(s):        --- Professional ---                           K31.89, Other diseases of stomach and duodenum                           D50.9, Iron deficiency anemia, unspecified CPT copyright 2019 American Medical Association. All rights reserved. The codes documented in this report are preliminary and upon coder review may  be revised to meet current compliance  requirements. Carol Ada, MD Carol Ada, MD 08/07/2018 8:20:48 AM This report has been signed electronically. Number of Addenda: 0

## 2018-08-07 NOTE — Anesthesia Procedure Notes (Signed)
Procedure Name: MAC Date/Time: 08/07/2018 8:08 AM Performed by: Orlie Dakin, CRNA Pre-anesthesia Checklist: Patient identified, Emergency Drugs available, Suction available and Patient being monitored Patient Re-evaluated:Patient Re-evaluated prior to induction Oxygen Delivery Method: Nasal cannula Preoxygenation: Pre-oxygenation with 100% oxygen Induction Type: IV induction

## 2018-08-07 NOTE — Anesthesia Postprocedure Evaluation (Signed)
Anesthesia Post Note  Patient: Worthington Cruzan  Procedure(s) Performed: ESOPHAGOGASTRODUODENOSCOPY (EGD) WITH PROPOFOL (N/A )     Patient location during evaluation: Endoscopy Anesthesia Type: MAC Level of consciousness: awake and alert Pain management: pain level controlled Vital Signs Assessment: post-procedure vital signs reviewed and stable Respiratory status: spontaneous breathing, nonlabored ventilation and respiratory function stable Cardiovascular status: stable and blood pressure returned to baseline Postop Assessment: no apparent nausea or vomiting Anesthetic complications: no    Last Vitals:  Vitals:   08/07/18 0815 08/07/18 0825  BP: (!) 158/67 (!) 173/75  Pulse: 69 66  Resp: (!) 24 (!) 23  Temp: 36.4 C   SpO2: 94% 94%    Last Pain:  Vitals:   08/07/18 0825  TempSrc:   PainSc: 0-No pain                 Catalina Gravel

## 2018-08-07 NOTE — Progress Notes (Signed)
Patient came back from Endo. No abnormal finding or bleeding upper GI. Planning to go to colonoscopy tomorrow. 1st blood transfusion finished at the Endo room and hung 2nd unit of blood transfusion. And finished 2nd unit of blood transfusion without complications. Pt c/o SOB, there was expiratory wheezing on Lt. Side. Paging RT for breathing treatment. Pt's sleeping on the bed, he said that he was tired, but no SOB at this time. Paging Dr. Benson Norway regarding Protonix drip because he's clear his stomach. Dr. Benson Norway gave verbal order to discontinue it. HS [employer]

## 2018-08-07 NOTE — TOC Progression Note (Signed)
Transition of Care [facility]) - Progression Note    Patient Details  Name: [name] MRN: [id number] Date of Birth: 1945-08-31  Transition of Care [facility]) CM/SW [place], [place] Phone Number: 08/07/2018, 11:32 AM  Clinical Narrative:     CSW unable to completed readmission assessment- patient was sleep. CSW will continue to follow.         Expected Discharge Plan and Services                                     Social Determinants of Health (SDOH) Interventions    Readmission Risk Interventions No flowsheet data found.

## 2018-08-07 NOTE — Addendum Note (Signed)
Addendum  created 08/07/18 0910 by Orlie Dakin, CRNA   Intraprocedure Flowsheets edited

## 2018-08-07 NOTE — Interval H&P Note (Signed)
History and Physical Interval Note:  08/07/2018 7:57 AM  [name]  has presented today for surgery, with the diagnosis of Anemia with guaiac positive stools.  The various methods of treatment have been discussed with the patient and family. After consideration of risks, benefits and other options for treatment, the patient has consented to  Procedure(s): ESOPHAGOGASTRODUODENOSCOPY (EGD) WITH PROPOFOL (N/A) as a surgical intervention.  The patient's history has been reviewed, patient examined, no change in status, stable for surgery.  I have reviewed the patient's chart and labs.  Questions were answered to the patient's satisfaction.     Yaser Harvill D

## 2018-08-08 ENCOUNTER — Inpatient Hospital Stay (HOSPITAL_COMMUNITY): Payer: Medicare Other | Admitting: Registered Nurse

## 2018-08-08 ENCOUNTER — Encounter (HOSPITAL_COMMUNITY): Payer: Self-pay

## 2018-08-08 ENCOUNTER — Other Ambulatory Visit: Payer: Self-pay | Admitting: Nurse Practitioner

## 2018-08-08 ENCOUNTER — Encounter (HOSPITAL_COMMUNITY)
Admission: EM | Disposition: A | Discharge status: Home / Self Care | Payer: Self-pay | Source: Home / Self Care | Attending: Internal Medicine

## 2018-08-08 DIAGNOSIS — N189 Chronic kidney disease, unspecified: Secondary | ICD-10-CM

## 2018-08-08 DIAGNOSIS — D638 Anemia in other chronic diseases classified elsewhere: Secondary | ICD-10-CM

## 2018-08-08 DIAGNOSIS — C9210 Chronic myeloid leukemia, BCR/ABL-positive, not having achieved remission: Secondary | ICD-10-CM

## 2018-08-08 DIAGNOSIS — I129 Hypertensive chronic kidney disease with stage 1 through stage 4 chronic kidney disease, or unspecified chronic kidney disease: Secondary | ICD-10-CM

## 2018-08-08 LAB — CBC WITH DIFFERENTIAL/PLATELET
ABS IMMATURE GRANULOCYTES: 0.03 10*3/uL (ref 0.00–0.07)
Basophils Absolute: 0 10*3/uL (ref 0.0–0.1)
Basophils Relative: 0 %
Eosinophils Absolute: 0.3 10*3/uL (ref 0.0–0.5)
Eosinophils Relative: 4 %
HCT: 27.1 % — ABNORMAL LOW (ref 39.0–52.0)
Hemoglobin: 8.8 g/dL — ABNORMAL LOW (ref 13.0–17.0)
Immature Granulocytes: 0 %
LYMPHS PCT: 9 %
Lymphs Abs: 0.7 10*3/uL (ref 0.7–4.0)
MCH: 25.6 pg — ABNORMAL LOW (ref 26.0–34.0)
MCHC: 32.5 g/dL (ref 30.0–36.0)
MCV: 78.8 fL — ABNORMAL LOW (ref 80.0–100.0)
Monocytes Absolute: 0.8 10*3/uL (ref 0.1–1.0)
Monocytes Relative: 10 %
Neutro Abs: 6.1 10*3/uL (ref 1.7–7.7)
Neutrophils Relative %: 77 %
Platelets: 138 10*3/uL — ABNORMAL LOW (ref 150–400)
RBC: 3.44 MIL/uL — ABNORMAL LOW (ref 4.22–5.81)
RDW: 17.1 % — ABNORMAL HIGH (ref 11.5–15.5)
WBC: 7.9 10*3/uL (ref 4.0–10.5)
nRBC: 0 % (ref 0.0–0.2)

## 2018-08-08 LAB — BPAM RBC
Blood Product Expiration Date: 202004132359
Blood Product Expiration Date: 202004132359
Blood Product Expiration Date: 202004132359
Blood Product Expiration Date: 202004132359
ISSUE DATE / TIME: 202003291122
ISSUE DATE / TIME: 202003291629
ISSUE DATE / TIME: 202003310431
ISSUE DATE / TIME: 202003310709
Unit Type and Rh: 5100
Unit Type and Rh: 5100
Unit Type and Rh: 5100
Unit Type and Rh: 5100

## 2018-08-08 LAB — TYPE AND SCREEN
ABO/RH(D): O POS
Antibody Screen: NEGATIVE
Unit division: 0
Unit division: 0
Unit division: 0
Unit division: 0

## 2018-08-08 LAB — BASIC METABOLIC PANEL
Anion gap: 11 (ref 5–15)
BUN: 55 mg/dL — ABNORMAL HIGH (ref 8–23)
CO2: 16 mmol/L — ABNORMAL LOW (ref 22–32)
Calcium: 7.8 mg/dL — ABNORMAL LOW (ref 8.9–10.3)
Chloride: 111 mmol/L (ref 98–111)
Creatinine, Ser: 3.45 mg/dL — ABNORMAL HIGH (ref 0.61–1.24)
GFR calc Af Amer: 19 mL/min — ABNORMAL LOW (ref >60)
GFR, EST NON AFRICAN AMERICAN: 17 mL/min — AB (ref >60)
Glucose, Bld: 89 mg/dL (ref 70–99)
POTASSIUM: 3.7 mmol/L (ref 3.5–5.1)
Sodium: 138 mmol/L (ref 135–145)

## 2018-08-08 SURGERY — CANCELLED PROCEDURE
Anesthesia: Monitor Anesthesia Care

## 2018-08-08 MED ORDER — PROPOFOL 500 MG/50ML IV EMUL
INTRAVENOUS | Status: DC | PRN
  Administered 2018-08-08: 75 ug/kg/min via INTRAVENOUS

## 2018-08-08 MED ORDER — PEG 3350-KCL-NA BICARB-NACL 420 G PO SOLR
4000.0000 mL | Freq: Once | ORAL | Status: AC
  Administered 2018-08-08: 14:00:00 4000 mL via ORAL
  Filled 2018-08-08: qty 4000

## 2018-08-08 MED ORDER — SODIUM BICARBONATE 650 MG PO TABS
650.0000 mg | ORAL_TABLET | Freq: Two times a day (BID) | ORAL | Status: DC
  Administered 2018-08-08 – 2018-08-09 (×3): 650 mg via ORAL
  Filled 2018-08-08 (×4): qty 1

## 2018-08-08 MED ORDER — PROPOFOL 10 MG/ML IV BOLUS
INTRAVENOUS | Status: DC | PRN
  Administered 2018-08-08 (×2): 20 mg via INTRAVENOUS

## 2018-08-08 SURGICAL SUPPLY — 22 items

## 2018-08-08 NOTE — Progress Notes (Signed)
IP PROGRESS NOTE  Subjective:   [name] is well-known to me with a history of chronic myelogenous leukemia.  He is currently being treated with bosutinib.  He started bosutinib in December 2019.  He has missed recent office visits at the Cancer center. He presented to the emergency room 08/05/2018 with dyspnea.  He also reports he was drinking heavily for several days prior to hospital admission and may have had a seizure.  No fever or cough. He was noted to have severe anemia with a hemoglobin of 5.2, MCV 81.2.  He denies rash, diarrhea, and bleeding.  He was transfused 2 units of packed red blood cells.  He reports feeling better.  The stool was Hemoccult positive.  An upper endoscopy yesterday was unremarkable.  He is scheduled for a colonoscopy today. Objective: Vital signs in last 24 hours: Blood pressure (!) 126/59, pulse 63, temperature 97.9 F (36.6 C), temperature source Oral, resp. rate 20, height 5\' 8"  (1.727 m), weight 145 lb (65.8 kg), SpO2 100 %.  Intake/Output from previous day: 03/31 0701 - 04/01 0700 In: 1548.6 [P.O.:400; I.V.:713.1; Blood:335.5; IV Piggyback:100] Out: 9357 [Urine:1650]  Physical Exam:  Not performed today    Lab Results: Recent Labs    08/07/18 0234 08/08/18 0229  WBC 9.8 7.9  HGB 6.6* 8.8*  HCT 21.0* 27.1*  PLT 171 138*    BMET Recent Labs    08/07/18 0234 08/08/18 0229  NA 138 138  K 3.6 3.7  CL 109 111  CO2 19* 16*  GLUCOSE 97 89  BUN 59* 55*  CREATININE 3.92* 3.45*  CALCIUM 7.8* 7.8*     Medications: I have reviewed the patient's current medications.  Assessment/Plan: 1. Chronic myelogenous leukemia, diagnosed in January of 2009. He remains in hematologic remission. He is taking Gleevec at a dose of 200 mg daily. The peripheral blood PCR was markedly increased in May 2013, likely reflecting medical noncompliance. The peripheral blood PCRwas stable on 05/11/2017.  Trial of dasatinib April/May 2019; discontinued due to  poor tolerance with diarrhea and nausea  Gleevec resumed May 2019  [place] placed on hold 11/02/2017  Trial of dasatinib 50 mg daily approximately 11/29/2017  Dasatinib placed on hold 12/14/2017  Gleevec 100 mg daily beginning 03/21/2018  Bosutinib 200 mg daily beginning12/23/2019 2. 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 3. Status post C3-C4, C4-C5, anterior cervical diskectomy and fusion with allograft and plating 09/30/2010. 4. 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 5. Depression 6. Diarrhea, ? related to chronic pancreatitis versus Gleevec. He takes Lomotil as needed.  7. Indurated facial skin lesion 11/20/2007 with a history of MRSA skin infection of the submental area in May 2008. The induration resolved with doxycycline. 8. Left knee arthroscopy 05/25/2007. 9. Postoperative left knee effusion/pain, likely related to gout. 10. History of gout.  11. Chronic pancreatitis. 12. Status post right above-the-knee amputation. 13. MRSA infection of the submental area May 2008. 14. History of tobacco, alcohol, and cocaine use. 15. History of coronary artery disease. 16. "Shotty" lymphadenopathy of the neck, axilla, and left groin in 2009.  17. History of a microcytic anemia.  18. History of a right olecranon bursa lesion, ? gouty tophus. 19. 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 He previously took AndroGel. 20. History of anemia secondary to chronic disease and Gleevec therapy.  21. history of anorexia-potentially related to [place]. He reports Medicaid would not pay for Megace.  22. Chronic left knee and  left foot pain.  23. Status post removal of a left knee screw 07/03/2012. 24. History of hypokalemia. Likely related to diarrhea.He is on a potassium supplement. 25. Status post left foot surgery. 26. Pulsatile fullness right neck. Evaluated by vascular surgery status post CT angiogram 03/07/2014 with no  carotid artery aneurysm identified. 27. Cough-abnormal lung exam 05/12/2015. He completed a course of Levaquin. Chest xray negative. 28. Hospitalization 09/20/2016 through 10/07/2016 with unresponsiveness/acute metabolic encephalopathy 29. Upper endoscopy 11/21/2017-normal esophagus; erythematous mucosa in the antrum which was biopsied; normal examined duodenum. 30. Chronic renal failure 31. Admission 06/20/2018 with a possible seizure and hypertensive emergency 32. Admission 08/05/2018 with severe anemia-negative upper endoscopy 08/07/2018 [name] has a complex medical history.  He was admitted with severe anemia.  The anemia is likely secondary to renal failure, chronic disease, and bleeding.  There may be a component of iron deficiency.  The renal failure is chronic and most likely related to hypertension.  I doubt the renal failure is related to the CML or bosutinib.  He is maintained on bosutinib for CML.  There is no clinical evidence for progression of the CML.  I will repeat the peripheral blood BCR: ABL.  Recommendations: 1.  Resume bosutinib 2.  Trial of oral iron 3.  GI evaluation per Dr. Collene Mares 4.  Check peripheral blood PCR 5.  Close follow-up with nephrology, will likely benefit from a trial of erythropoietin therapy 6.  Outpatient follow-up will be scheduled in the hematology clinic 7.  Please call hematology as needed     LOS: 3 days   [name], MD   08/08/2018, 2:09 PM

## 2018-08-08 NOTE — Transfer of Care (Signed)
Immediate Anesthesia Transfer of Care Note  Patient: [name]  Procedure(s) Performed: CANCELLED PROCEDURE  Patient Location: PACU and Endoscopy Unit  Anesthesia Type:MAC  Level of Consciousness: awake, alert  and oriented  Airway & Oxygen Therapy: Patient Spontanous Breathing  Post-op Assessment: Report given to RN and Post -op Vital signs reviewed and stable  Post vital signs: Reviewed and stable  Last Vitals:  Vitals Value Taken Time  BP    Temp    Pulse    Resp    SpO2      Last Pain:  Vitals:   08/08/18 0946  TempSrc: Oral  PainSc: 0-No pain         Complications: No apparent anesthesia complications

## 2018-08-08 NOTE — Progress Notes (Signed)
Pt . is now awake and alert. Started with golytely, instructed to have 8 oz every 15 min. Non- compliant at  times, continue to supervise intake.Family member on the phone trying to convince him to take the whole gallon of golytely.[name]

## 2018-08-08 NOTE — Progress Notes (Signed)
Encouraged to drink more of the golytely. Managed to drink a few,  Able to tolerate 1/2 gallon of the prep. As per report MD aware of the amount of prep. consumed. No Bm noted since 7 am.

## 2018-08-08 NOTE — Progress Notes (Signed)
Able to tolerate approx. 2L of golytely. Encouraged to take more but claimed to take it later.
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
## 2018-08-08 NOTE — Anesthesia Preprocedure Evaluation (Addendum)
Anesthesia Evaluation  Patient identified by MRN, date of birth, ID band Patient awake    Reviewed: Allergy & Precautions, NPO status , Patient's Chart, lab work & pertinent test results, reviewed documented beta blocker date and time   Airway Mallampati: II  TM Distance: >3 FB Neck ROM: Full    Dental no notable dental hx. (+) Poor Dentition, Dental Advisory Given, Partial Lower, Partial Upper   Pulmonary COPD,  COPD inhaler, former smoker,    Pulmonary exam normal breath sounds clear to auscultation       Cardiovascular hypertension, Pt. on medications and Pt. on home beta blockers + Past MI  Normal cardiovascular exam Rhythm:Regular Rate:Normal  TTE 07/2018 EF 45-50%, mild to mod MR  Carotid US 06/2018 Right Carotid: Velocities in the right ICA are consistent with a 1-39% stenosis. Left Carotid: Velocities in the left ICA are consistent with a 40-59% stenosis.  Stress Test 2018 1. No reversible ischemia. Fixed defect involving the inferior wall compatible with history of prior myocardial infarction. 2. Lateral wall hypokinesis. 3. Left ventricular ejection fraction 51% 4. Non invasive risk stratification*: Low    Neuro/Psych Seizures -, Well Controlled,  PSYCHIATRIC DISORDERS Anxiety Depression CVA    GI/Hepatic negative GI ROS, Neg liver ROS,   Endo/Other  negative endocrine ROS  Renal/GU Renal InsufficiencyRenal disease  negative genitourinary   Musculoskeletal negative musculoskeletal ROS (+)   Abdominal   Peds negative pediatric ROS (+)  Hematology  (+) Blood dyscrasia, anemia , Hgb 6.6-->8.8 s/p 1U RBC   Anesthesia Other Findings   Reproductive/Obstetrics negative OB ROS                           Anesthesia Physical Anesthesia Plan  ASA: III  Anesthesia Plan: MAC   Post-op Pain Management:    Induction: Intravenous  PONV Risk Score and Plan: 1 and Propofol infusion and  Treatment may vary due to age or medical condition  Airway Management Planned: Natural Airway and Simple Face Mask  Additional Equipment:   Intra-op Plan:   Post-operative Plan:   Informed Consent: I have reviewed the patients History and Physical, chart, labs and discussed the procedure including the risks, benefits and alternatives for the proposed anesthesia with the patient or authorized representative who has indicated his/her understanding and acceptance.     Dental advisory given  Plan Discussed with: CRNA  Anesthesia Plan Comments:         Anesthesia Quick Evaluation

## 2018-08-08 NOTE — Progress Notes (Signed)
BP control improving. When relaxed SBP 110-130s, when more alert, 150s. I would not plan on any additional med changes as an inpatient.  [facility] will sign off.   Medication Recommendations:  Continue current meds Other recommendations (labs, testing, etc):  Will need frequent reevaluation of renal function and H/H Follow up as an outpatient:  One month after DC, probably a telemedicine visit.  Sanda Klein, MD, [facility] [phone] office [phone] pager

## 2018-08-08 NOTE — Progress Notes (Signed)
Subjective: Patient prepped for a colonoscopy for workup of anemia and guaiac positive stools.  Objective: Vital signs in last 24 hours: Temp:  [98.3 F (36.8 C)-98.9 F (37.2 C)] 98.5 F (36.9 C) (04/01 0946) Pulse Rate:  [58-68] 68 (04/01 0946) Resp:  [14-22] 18 (04/01 0946) BP: (110-167)/(64-77) 167/75 (04/01 0946) SpO2:  [93 %-100 %] 100 % (04/01 0946) Weight:  [65.8 kg] 65.8 kg (04/01 0946) Last BM Date: 08/05/18  Intake/Output from previous day: 03/31 0701 - 04/01 0700 In: 1548.6 [P.O.:400; I.V.:713.1; Blood:335.5; IV Piggyback:100] Out: 0600 [Urine:1650] Intake/Output this shift: No intake/output data recorded.  General appearance: alert, cooperative, fatigued and no distress Resp: clear to auscultation bilaterally Cardio: regular rate and rhythm, S1, S2 normal, no murmur, click, rub or gallop GI: soft, non-tender; bowel sounds normal; no masses,  no organomegaly  Lab Results: Recent Labs    08/06/18 0629 08/07/18 0234 08/08/18 0229  WBC 11.1* 9.8 7.9  HGB 7.2* 6.6* 8.8*  HCT 22.2* 21.0* 27.1*  PLT 175 171 138*   BMET Recent Labs    08/06/18 0823 08/07/18 0234 08/08/18 0229  NA 138 138 138  K 4.4 3.6 3.7  CL 111 109 111  CO2 17* 19* 16*  GLUCOSE 138* 97 89  BUN 57* 59* 55*  CREATININE 3.99* 3.92* 3.45*  CALCIUM 8.1* 7.8* 7.8*   Studies/Results: No results found.  Medications: I have reviewed the patient's current medications.  Assessment/Plan: Anemia with guaiac positive stools-proceed with a colonoscopy today.  LOS: 3 days   Briani Maul 08/08/2018, 10:08 AM

## 2018-08-08 NOTE — Progress Notes (Signed)
Subjective: Brown pasty stool noted coming from the rectum before starting the colonoscopy.  Objective: Vital signs in last 24 hours: Temp:  [98.3 F (36.8 C)-98.9 F (37.2 C)] 98.5 F (36.9 C) (04/01 0946) Pulse Rate:  [58-68] 68 (04/01 0946) Resp:  [14-22] 18 (04/01 0946) BP: (110-167)/(64-77) 167/75 (04/01 0946) SpO2:  [93 %-100 %] 100 % (04/01 0946) Weight:  [65.8 kg] 65.8 kg (04/01 0946) Last BM Date: 08/05/18  Intake/Output from previous day: 03/31 0701 - 04/01 0700 In: 1548.6 [P.O.:400; I.V.:713.1; Blood:335.5; IV Piggyback:100] Out: 0932 [Urine:1650] Intake/Output this shift: Total I/O In: 500 [I.V.:500] Out: 0   General appearance: cooperative and no distress Resp: clear to auscultation bilaterally Cardio: regular rate and rhythm, S1, S2 normal, no murmur, click, rub or gallop GI: soft, non-tender; bowel sounds normal; no masses,  no organomegaly  Lab Results: Recent Labs    08/06/18 0629 08/07/18 0234 08/08/18 0229  WBC 11.1* 9.8 7.9  HGB 7.2* 6.6* 8.8*  HCT 22.2* 21.0* 27.1*  PLT 175 171 138*   BMET Recent Labs    08/06/18 0823 08/07/18 0234 08/08/18 0229  NA 138 138 138  K 4.4 3.6 3.7  CL 111 109 111  CO2 17* 19* 16*  GLUCOSE 138* 97 89  BUN 57* 59* 55*  CREATININE 3.99* 3.92* 3.45*  CALCIUM 8.1* 7.8* 7.8*   Medications: I have reviewed the patient's current medications.  Assessment/Plan: Anemia and guaiac positive stools:will reprep for tomorrow.   LOS: 3 days   [name] 08/08/2018, 11:17 AM

## 2018-08-08 NOTE — Anesthesia Procedure Notes (Signed)
Date/Time: 08/08/2018 11:05 AM Performed by: Trinna Post., CRNA Pre-anesthesia Checklist: Patient identified, Emergency Drugs available, Suction available, Patient being monitored and Timeout performed Patient Re-evaluated:Patient Re-evaluated prior to induction Oxygen Delivery Method: Nasal cannula Preoxygenation: Pre-oxygenation with 100% oxygen Induction Type: IV induction Placement Confirmation: positive ETCO2

## 2018-08-08 NOTE — Progress Notes (Signed)
Pt is not cooperative enough to take golytely himself despite cups of the prep .made ready  and within his reach. RN has to give prep every 15 min that sometimes refused to take it. Continue to  Assist him in taking the prep.

## 2018-08-08 NOTE — Anesthesia Postprocedure Evaluation (Signed)
Anesthesia Post Note  Patient: [name]  Procedure(s) Performed: CANCELLED PROCEDURE     Patient location during evaluation: Endoscopy Anesthesia Type: MAC Level of consciousness: awake and alert Pain management: pain level controlled Vital Signs Assessment: post-procedure vital signs reviewed and stable Respiratory status: spontaneous breathing, nonlabored ventilation, respiratory function stable and patient connected to nasal cannula oxygen Cardiovascular status: stable and blood pressure returned to baseline Postop Assessment: no apparent nausea or vomiting Anesthetic complications: no Comments: Procedure cancelled 2/2 inadequate bowel prep    Last Vitals:  Vitals:   08/08/18 1132 08/08/18 1159  BP: (!) 130/54 (!) 126/59  Pulse: 63   Resp: 20   Temp:    SpO2: 100%     Last Pain:  Vitals:   08/08/18 1159  TempSrc:   PainSc: Asleep                 Farryn Linares L Edwyna Dangerfield

## 2018-08-08 NOTE — Progress Notes (Signed)
Transported to endoscopy by bed for colonoscopy.

## 2018-08-08 NOTE — Progress Notes (Addendum)
Back from endoscopy by bed lethargic but arousable. bp- 126/59 hr- 62 NSR , pulse ox-94 % on room air, r-19/min. Kept NPO. To start on  Golytely.as ordered.

## 2018-08-08 NOTE — TOC Initial Note (Signed)
Transition of Care [facility]) - Initial/Assessment Note    Patient Details  Name: [name] MRN: [id number] Date of Birth: 09-11-1945  Transition of Care [facility]) CM/SW Contact:    [name], [place] Phone Number: 08/08/2018, 3:57 PM  Clinical Narrative:                 CSW visited with the patient at bedside. He appeared to be very tried but engaged in conversation. Patient states he lives alone but has significant other that takes him to doctors appointment and makes sure he gets his medications.  Patient states his primary PCP is [name] with [facility] on [address] in [place]. Patient denies any barriers.      Barriers to Discharge: Continued Medical Work up   Patient Goals and CMS Choice        Expected Discharge Plan and Services         Living arrangements for the past 2 months: Apartment                          Prior Living Arrangements/Services Living arrangements for the past 2 months: Apartment Lives with:: Self Patient language and need for interpreter reviewed:: No Do you feel safe going back to the place where you live?: Yes      Need for Family Participation in Patient Care: Yes (Comment) Care giver support system in place?: Yes (comment)   Criminal Activity/Legal Involvement Pertinent to Current Situation/Hospitalization: No - Comment as needed  Activities of Daily Living Home Assistive Devices/Equipment: Wheelchair ADL Screening (condition at time of admission) Patient's cognitive ability adequate to safely complete daily activities?: Yes Is the patient deaf or have difficulty hearing?: No Does the patient have difficulty seeing, even when wearing glasses/contacts?: No Does the patient have difficulty concentrating, remembering, or making decisions?: No Patient able to express need for assistance with ADLs?: Yes Does the patient have difficulty dressing or bathing?: No Independently performs ADLs?: No Does the patient have  difficulty walking or climbing stairs?: Yes Weakness of Legs: Left(rt BKA) Weakness of Arms/Hands: Both  Permission Sought/Granted Permission sought to share information with : Facility Sport and exercise psychologist, Family Supports Permission granted to share information with : Yes, Verbal Permission Granted  Share Information with NAME: [name]      Permission granted to share info w Relationship: significant other   Permission granted to share info w Contact Information: [phone]  Emotional Assessment Appearance:: Appears stated age Attitude/Demeanor/Rapport: Other (comment)(very tired ) Affect (typically observed): Appropriate, Calm Orientation: : Oriented to Self, Oriented to Place, Oriented to  Time, Oriented to Situation Alcohol / Substance Use: Not Applicable Psych Involvement: No (comment)  Admission diagnosis:  Acute on chronic respiratory failure with hypoxia (HCC) [J96.21] Symptomatic anemia [D64.9] Acute congestive heart failure, unspecified heart failure type [facility]) [I50.9] Patient Active Problem List   Diagnosis Date Noted  . Dyspnea 08/05/2018  . Occult blood in stools   . Microcytic anemia   . Chest pain of uncertain etiology   . Troponin level elevated   . CKD (chronic kidney disease), stage IV ([place])   . Malnutrition of moderate degree 06/22/2018  . Seizure ([place]) 06/21/2018  . Status epilepticus ([place]) 06/21/2018  . Stroke ([place]) 06/20/2018  . CKD (chronic kidney disease), stage III ([place]) 06/20/2018  . Olecranon bursitis, right elbow 11/16/2016  . Idiopathic chronic gout of multiple sites without tophus 11/16/2016  . Hypernatremia 09/30/2016  . Dehydration 09/30/2016  . Acute  gout 09/30/2016  . Severe depression ([place]) 09/30/2016  . Acute metabolic encephalopathy 39/07/90  . Hypokalemia 09/30/2016  . Chronic pancreatitis ([place]) 09/30/2016  . Exhausted vascular access 09/30/2016  . Leukocytosis 09/30/2016  . Enterococcus UTI 09/30/2016  . Acute  respiratory failure with hypoxemia ([place])   . Cellulitis of upper extremity   . Respiratory distress   . Chest pain 09/10/2016  . Muscle cramps 09/10/2016  . Gout attack 04/30/2016  . Unintentional weight loss 04/29/2016  . Pancreatitis 03/14/2016  . Acute kidney injury ([place]) 03/14/2016  . Abdominal pain 03/14/2016  . Hypokalemia 03/14/2016  . Diarrhea, unspecified 03/14/2016  . Dehydration 01/22/2016  . Intractable nausea and vomiting 01/22/2016  . Alcohol intoxication ([place]) 04/03/2015  . Alcohol use, 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[place]) 04/03/2015  . Poor dentition 02/16/2015  . Essential hypertension 02/15/2015  . Tobacco abuse 02/15/2015  . Cellulitis of submandibular region 02/15/2015  . 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 Painful orthopaedic hardware ([place]) 07/03/2012  . 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[place]) 01/27/2012  . Ankylosis of left knee 06/10/2011   PCP:  Bruce Ebbs, MD Pharmacy:   [id number] - [place], [place] [address] [place] [address] Phone: [phone] Fax: [phone]  [facility] [place], [place] [address] [place] [address] Phone: [phone] Fax: [phone]     Social Determinants of Health ([place]) Interventions    Readmission Risk Interventions Readmission Risk Prevention Plan 08/08/2018  Transportation Screening Complete  Medication Review ([place]) Complete  PCP or Specialist appointment within 3-5 days of discharge Complete  HRI or [place] Complete  SW Recovery Care/Counseling Consult Complete  [place] Not Applicable  Some recent data might be hidden

## 2018-08-09 ENCOUNTER — Encounter (HOSPITAL_COMMUNITY): Payer: Self-pay

## 2018-08-09 ENCOUNTER — Inpatient Hospital Stay (HOSPITAL_COMMUNITY): Payer: Medicare Other | Admitting: Anesthesiology

## 2018-08-09 ENCOUNTER — Encounter (HOSPITAL_COMMUNITY)
Admission: EM | Disposition: A | Discharge status: Home / Self Care | Payer: Self-pay | Source: Home / Self Care | Attending: Internal Medicine

## 2018-08-09 HISTORY — PX: COLONOSCOPY: SHX5424

## 2018-08-09 LAB — RENAL FUNCTION PANEL
Albumin: 2.1 g/dL — ABNORMAL LOW (ref 3.5–5.0)
Anion gap: 10 (ref 5–15)
BUN: 51 mg/dL — ABNORMAL HIGH (ref 8–23)
CO2: 16 mmol/L — ABNORMAL LOW (ref 22–32)
Calcium: 7.8 mg/dL — ABNORMAL LOW (ref 8.9–10.3)
Chloride: 112 mmol/L — ABNORMAL HIGH (ref 98–111)
Creatinine, Ser: 3.33 mg/dL — ABNORMAL HIGH (ref 0.61–1.24)
GFR calc Af Amer: 20 mL/min — ABNORMAL LOW (ref >60)
GFR calc non Af Amer: 17 mL/min — ABNORMAL LOW (ref >60)
Glucose, Bld: 83 mg/dL (ref 70–99)
Phosphorus: 4.8 mg/dL — ABNORMAL HIGH (ref 2.5–4.6)
Potassium: 3.7 mmol/L (ref 3.5–5.1)
Sodium: 138 mmol/L (ref 135–145)

## 2018-08-09 LAB — CBC WITH DIFFERENTIAL/PLATELET
Abs Immature Granulocytes: 0.02 10*3/uL (ref 0.00–0.07)
Basophils Absolute: 0 10*3/uL (ref 0.0–0.1)
Basophils Relative: 0 %
Eosinophils Absolute: 0.5 10*3/uL (ref 0.0–0.5)
Eosinophils Relative: 6 %
HCT: 27.8 % — ABNORMAL LOW (ref 39.0–52.0)
Hemoglobin: 9.1 g/dL — ABNORMAL LOW (ref 13.0–17.0)
Immature Granulocytes: 0 %
Lymphocytes Relative: 7 %
Lymphs Abs: 0.6 10*3/uL — ABNORMAL LOW (ref 0.7–4.0)
MCH: 26.3 pg (ref 26.0–34.0)
MCHC: 32.7 g/dL (ref 30.0–36.0)
MCV: 80.3 fL (ref 80.0–100.0)
Monocytes Absolute: 0.7 10*3/uL (ref 0.1–1.0)
Monocytes Relative: 8 %
Neutro Abs: 6.7 10*3/uL (ref 1.7–7.7)
Neutrophils Relative %: 79 %
Platelets: 128 10*3/uL — ABNORMAL LOW (ref 150–400)
RBC: 3.46 MIL/uL — ABNORMAL LOW (ref 4.22–5.81)
RDW: 17.8 % — ABNORMAL HIGH (ref 11.5–15.5)
WBC: 8.5 10*3/uL (ref 4.0–10.5)
nRBC: 0 % (ref 0.0–0.2)

## 2018-08-09 LAB — MAGNESIUM: Magnesium: 1.8 mg/dL (ref 1.7–2.4)

## 2018-08-09 SURGERY — COLONOSCOPY
Anesthesia: Monitor Anesthesia Care

## 2018-08-09 MED ORDER — SODIUM CHLORIDE 0.9 % IV SOLN
INTRAVENOUS | Status: DC
  Administered 2018-08-09 (×3): via INTRAVENOUS

## 2018-08-09 MED ORDER — PROPOFOL 500 MG/50ML IV EMUL
INTRAVENOUS | Status: DC | PRN
  Administered 2018-08-09: 75 ug/kg/min via INTRAVENOUS

## 2018-08-09 MED ORDER — CLONIDINE HCL 0.2 MG PO TABS: 0.2000 mg | ORAL_TABLET | Freq: Three times a day (TID) | ORAL | 0 refills | Status: AC

## 2018-08-09 MED ORDER — LIDOCAINE HCL (CARDIAC) PF 100 MG/5ML IV SOSY
PREFILLED_SYRINGE | INTRAVENOUS | Status: DC | PRN
  Administered 2018-08-09: 100 mg via INTRAVENOUS

## 2018-08-09 MED ORDER — PHENYLEPHRINE HCL 10 MG/ML IJ SOLN
INTRAMUSCULAR | Status: DC | PRN
  Administered 2018-08-09: 80 ug via INTRAVENOUS

## 2018-08-09 MED ORDER — ALLOPURINOL 100 MG PO TABS: 300.0000 mg | ORAL_TABLET | Freq: Every day | ORAL | 0 refills | Status: DC

## 2018-08-09 MED ORDER — GABAPENTIN 100 MG PO CAPS: 100.0000 mg | ORAL_CAPSULE | Freq: Every day | ORAL | 0 refills | Status: AC

## 2018-08-09 MED ORDER — PROPOFOL 10 MG/ML IV BOLUS
INTRAVENOUS | Status: DC | PRN
  Administered 2018-08-09: 20 mg via INTRAVENOUS
  Administered 2018-08-09: 30 mg via INTRAVENOUS

## 2018-08-09 MED ORDER — SODIUM BICARBONATE 650 MG PO TABS: 650.0000 mg | ORAL_TABLET | Freq: Two times a day (BID) | ORAL | 0 refills | Status: DC

## 2018-08-09 NOTE — Op Note (Signed)
[facility] Patient Name: [name] Procedure Date : 08/09/2018 MRN: [id number] Attending MD: Carol Ada , MD Date of Birth: 1945/07/17 CSN: [id number] Age: 73 Admit Type: Inpatient Procedure:                Colonoscopy Indications:              Heme positive stool, Iron deficiency anemia Providers:                Carol Ada, MD, Carlyn Reichert, RN, Glori Bickers,                            RN, William Dalton, Technician, Alyson Ingles,                            Technician Referring MD:              Medicines:                Propofol per Anesthesia Complications:            No immediate complications. Estimated Blood Loss:     Estimated blood loss: none. Procedure:                Pre-Anesthesia Assessment:                           - Prior to the procedure, a History and Physical                            was performed, and patient medications and                            allergies were reviewed. The patient's tolerance of                            previous anesthesia was also reviewed. The risks                            and benefits of the procedure and the sedation                            options and risks were discussed with the patient.                            All questions were answered, and informed consent                            was obtained. Prior Anticoagulants: The patient has                            taken no previous anticoagulant or antiplatelet                            agents. ASA Grade Assessment: III - A patient with  severe systemic disease. After reviewing the risks                            and benefits, the patient was deemed in                            satisfactory condition to undergo the procedure.                           - Sedation was administered by an anesthesia                            professional. Deep sedation was attained.                           After obtaining informed consent,  the colonoscope                            was passed under direct vision. Throughout the                            procedure, the patient's blood pressure, pulse, and                            oxygen saturations were monitored continuously. The                            CF-HQ190L ([id number]) Olympus colonoscope was                            introduced through the anus and advanced to the the                            cecum, identified by appendiceal orifice and                            ileocecal valve. The colonoscopy was somewhat                            difficult. The patient tolerated the procedure                            well. The quality of the bowel preparation was                            good. The ileocecal valve, appendiceal orifice, and                            rectum were photographed. Scope In: 1:01:58 PM Scope Out: 1:20:02 PM Scope Withdrawal Time: 0 hours 11 minutes 41 seconds  Total Procedure Duration: 0 hours 18 minutes 4 seconds  Findings:      Scattered small and large-mouthed diverticula were found in the       transverse colon, hepatic flexure and ascending colon. Impression:               -  Diverticulosis in the transverse colon, at the                            hepatic flexure and in the ascending colon.                           - No specimens collected. Recommendation:           - Return patient to hospital ward for ongoing care.                           - Resume regular diet.                           - Continue present medications.                           - No further inpatient GI work up. There is no                            clear source of GI bleeding to explain his anemia.                           - Follow up in the office in 2-4 weeks.                           - Signing off. Procedure Code(s):        --- Professional ---                           [phone], Colonoscopy, flexible; diagnostic, including                             collection of specimen(s) by brushing or washing,                            when performed (separate procedure) Diagnosis Code(s):        --- Professional ---                           R19.5, Other fecal abnormalities                           D50.9, Iron deficiency anemia, unspecified                           K57.30, Diverticulosis of large intestine without                            perforation or abscess without bleeding CPT copyright 2019 American Medical Association. All rights reserved. The codes documented in this report are preliminary and upon coder review may  be revised to meet current compliance requirements. Carol Ada, MD Carol Ada, MD 08/09/2018 1:30:41 PM This report has been signed electronically. Number of Addenda: 0

## 2018-08-09 NOTE — Anesthesia Postprocedure Evaluation (Signed)
Anesthesia Post Note  Patient: [name]  Procedure(s) Performed: COLONOSCOPY (N/A )     Patient location during evaluation: PACU Anesthesia Type: MAC Level of consciousness: awake and alert Pain management: pain level controlled Vital Signs Assessment: post-procedure vital signs reviewed and stable Respiratory status: spontaneous breathing, nonlabored ventilation, respiratory function stable and patient connected to nasal cannula oxygen Cardiovascular status: stable and blood pressure returned to baseline Postop Assessment: no apparent nausea or vomiting Anesthetic complications: no    Last Vitals:  Vitals:   08/09/18 1340 08/09/18 1350  BP: (!) 131/43 (!) 135/42  Pulse: 64 64  Resp: 15 16  Temp:    SpO2: 100% 97%    Last Pain:  Vitals:   08/09/18 1327  TempSrc: Oral  PainSc: 0-No pain                 Effie Berkshire

## 2018-08-09 NOTE — Progress Notes (Signed)
Continuing to strongly encourage the patient to drink colonoscopy prep. Patient requires frequent reminders and teaching reinforcement as to the importance of the prep throughout the evening.  Patient states that he gets short of breath when drinking the prep. Patient O2 sats have remained within 99-95% range throughout the evening.  Patient is now refusing to drink anymore of the prep and states that he only wants water. As of now, patient has tolerated approximately 3/4 of the prep. Will continue to assist and encourage the patient to drink more. Stools have been very watery throughout the evening, but still brown.

## 2018-08-09 NOTE — Progress Notes (Signed)
Discharged pt via w/c with cellphone and clothes. Explained and discussed discharge instructions, follow up visits and prescriptions with patient and [name] in the car. Prescriptions and electronic prescription sent to [facility].

## 2018-08-09 NOTE — Plan of Care (Signed)
  Problem: Health Behavior/Discharge Planning: Goal: Ability to manage health-related needs will improve 08/09/2018 0830 by Don Perking, RN Outcome: Progressing 08/09/2018 0827 by Don Perking, RN Outcome: Progressing   Problem: Clinical Measurements: Goal: Ability to maintain clinical measurements within normal limits will improve 08/09/2018 0830 by Don Perking, RN Outcome: Progressing 08/09/2018 0827 by Don Perking, RN Outcome: Progressing Goal: Will remain free from infection 08/09/2018 0830 by Don Perking, RN Outcome: Progressing 08/09/2018 0827 by Don Perking, RN Outcome: Progressing Goal: Diagnostic test results will improve 08/09/2018 0830 by Don Perking, RN Outcome: Progressing 08/09/2018 0827 by Don Perking, RN Outcome: Progressing Goal: Respiratory complications will improve 08/09/2018 0830 by Don Perking, RN Outcome: Progressing 08/09/2018 0827 by Don Perking, RN Outcome: Progressing Goal: Cardiovascular complication will be avoided 08/09/2018 0830 by Don Perking, RN Outcome: Progressing 08/09/2018 0827 by Don Perking, RN Outcome: Progressing   Problem: Activity: Goal: Risk for activity intolerance will decrease 08/09/2018 0830 by Don Perking, RN Outcome: Progressing 08/09/2018 0827 by Don Perking, RN Outcome: Progressing   Problem: Nutrition: Goal: Adequate nutrition will be maintained 08/09/2018 0830 by Don Perking, RN Outcome: Progressing 08/09/2018 0827 by Don Perking, RN Outcome: Progressing   Problem: Coping: Goal: Level of anxiety will decrease 08/09/2018 0830 by Don Perking, RN Outcome: Progressing 08/09/2018 0827 by Don Perking, RN Outcome: Progressing   Problem: Elimination: Goal: Will not experience complications related to bowel motility 08/09/2018 0830 by Don Perking, RN Outcome: Progressing 08/09/2018 0827 by Don Perking, RN Outcome:  Progressing Goal: Will not experience complications related to urinary retention 08/09/2018 0830 by Don Perking, RN Outcome: Progressing 08/09/2018 0827 by Don Perking, RN Outcome: Progressing   Problem: Pain Managment: Goal: General experience of comfort will improve 08/09/2018 0830 by Don Perking, RN Outcome: Progressing 08/09/2018 0827 by Don Perking, RN Outcome: Progressing   Problem: Safety: Goal: Ability to remain free from injury will improve 08/09/2018 0830 by Don Perking, RN Outcome: Progressing 08/09/2018 0827 by Don Perking, RN Outcome: Progressing   Problem: Skin Integrity: Goal: Risk for impaired skin integrity will decrease 08/09/2018 0830 by Don Perking, RN Outcome: Progressing 08/09/2018 0827 by Don Perking, RN Outcome: Progressing
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
## 2018-08-09 NOTE — Care Management Important Message (Signed)
Important Message  Patient Details  Name: [name] MRN: [id number] Date of Birth: 1945/07/20   Medicare Important Message Given:  Yes    Orbie Pyo 08/09/2018, 3:04 PM

## 2018-08-09 NOTE — Plan of Care (Signed)

## 2018-08-09 NOTE — Transfer of Care (Signed)
Immediate Anesthesia Transfer of Care Note  Patient: Jaicion Laurie  Procedure(s) Performed: COLONOSCOPY (N/A )  Patient Location: Endoscopy Unit  Anesthesia Type:MAC  Level of Consciousness: awake, alert , oriented and patient cooperative  Airway & Oxygen Therapy: Patient Spontanous Breathing and Patient connected to nasal cannula oxygen  Post-op Assessment: Report given to RN and Post -op Vital signs reviewed and stable  Post vital signs: Reviewed and stable  Last Vitals:  Vitals Value Taken Time  BP 139/56 08/09/2018  1:28 PM  Temp 36.5 C 08/09/2018  1:27 PM  Pulse 65 08/09/2018  1:29 PM  Resp 17 08/09/2018  1:29 PM  SpO2 99 % 08/09/2018  1:29 PM  Vitals shown include unvalidated device data.  Last Pain:  Vitals:   08/09/18 1327  TempSrc: Oral  PainSc: 0-No pain         Complications: No apparent anesthesia complications
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
## 2018-08-09 NOTE — Progress Notes (Signed)
Discussed f/u plan with Dr. Loletha Grayer as he signed off yesterday. As patient may require higher level of care, he recommends an actual OV in 3 months in our office instead of 1 mo telehealth visit as previously suggested. Have arranged appt with Dr. Marlou Porch for 7/13, appt info on AVS. Melina Copa PA-C

## 2018-08-09 NOTE — Plan of Care (Signed)

## 2018-08-09 NOTE — Anesthesia Preprocedure Evaluation (Addendum)
Anesthesia Evaluation  Patient identified by MRN, date of birth, ID band Patient awake    Reviewed: Allergy & Precautions, NPO status , Patient's Chart, lab work & pertinent test results  Airway Mallampati: I  TM Distance: >3 FB Neck ROM: Full    Dental  (+) Missing, Poor Dentition, Partial Upper   Pulmonary COPD, former smoker,    Pulmonary exam normal        Cardiovascular hypertension, + Past MI   Rhythm:Regular Rate:Normal     Neuro/Psych Seizures -,  Anxiety Depression CVA    GI/Hepatic negative GI ROS, Neg liver ROS,   Endo/Other    Renal/GU      Musculoskeletal  (+) Arthritis ,   Abdominal Normal abdominal exam  (+)   Peds  Hematology   Anesthesia Other Findings   Reproductive/Obstetrics                            Lab Results  Component Value Date   WBC 8.5 08/09/2018   HGB 9.1 (L) 08/09/2018   HCT 27.8 (L) 08/09/2018   MCV 80.3 08/09/2018   PLT 128 (L) 08/09/2018   Lab Results  Component Value Date   CREATININE 3.33 (H) 08/09/2018   BUN 51 (H) 08/09/2018   NA 138 08/09/2018   K 3.7 08/09/2018   CL 112 (H) 08/09/2018   CO2 16 (L) 08/09/2018   Lab Results  Component Value Date   INR 1.09 06/20/2018   INR 1.25 09/26/2016   INR 1.07 04/30/2016   Echo:  1. The left ventricle has mildly reduced systolic function, with an ejection fraction of 45-50%. The cavity size was mildly dilated. There is moderately increased left ventricular wall thickness. Left ventricular diastolic parameters were normal.  2. Abnormal septal motion inferior basal hypokinesis.  3. The right ventricle has normal systolic function. The cavity was normal. There is no increase in right ventricular wall thickness.  4. Mild thickening of the mitral valve leaflet. Mild calcification of the mitral valve leaflet. Mitral valve regurgitation is mild to moderate by color flow Doppler.  5. The aortic valve is  tricuspid. Mild thickening of the aortic valve. Moderate calcification of the aortic valve.  EKG: normal sinus rhythm.  Anesthesia Physical Anesthesia Plan  ASA: III  Anesthesia Plan: MAC   Post-op Pain Management:    Induction: Intravenous  PONV Risk Score and Plan: 1 and Propofol infusion and Ondansetron  Airway Management Planned: Simple Face Mask and Natural Airway  Additional Equipment: None  Intra-op Plan:   Post-operative Plan:   Informed Consent: I have reviewed the patients History and Physical, chart, labs and discussed the procedure including the risks, benefits and alternatives for the proposed anesthesia with the patient or authorized representative who has indicated his/her understanding and acceptance.       Plan Discussed with: CRNA  Anesthesia Plan Comments:        Anesthesia Quick Evaluation

## 2018-08-10 ENCOUNTER — Telehealth: Payer: Self-pay | Admitting: Nurse Practitioner

## 2018-08-10 LAB — CULTURE, BLOOD (ROUTINE X 2)
Culture: NO GROWTH
Culture: NO GROWTH
Special Requests: ADEQUATE
Special Requests: ADEQUATE

## 2018-08-10 NOTE — Telephone Encounter (Signed)
Scheduled appt per 4/1 sch message - pt aware of appt date and time   

## 2018-08-12 ENCOUNTER — Encounter (HOSPITAL_COMMUNITY): Payer: Self-pay | Admitting: Gastroenterology

## 2018-08-14 LAB — BCR-ABL1, CML/ALL, PCR, QUANT: b2a2 transcript: 10.4749 %

## 2018-08-16 ENCOUNTER — Other Ambulatory Visit: Payer: Self-pay

## 2018-08-16 ENCOUNTER — Inpatient Hospital Stay: Payer: Medicare Other

## 2018-08-16 ENCOUNTER — Encounter: Payer: Self-pay | Admitting: Nurse Practitioner

## 2018-08-16 ENCOUNTER — Inpatient Hospital Stay: Payer: Medicare Other | Attending: Nurse Practitioner | Admitting: Nurse Practitioner

## 2018-08-16 VITALS — BP 146/81 | HR 58 | Temp 98.4°F | Resp 18 | Ht 68.0 in

## 2018-08-16 DIAGNOSIS — R197 Diarrhea, unspecified: Secondary | ICD-10-CM | POA: Insufficient documentation

## 2018-08-16 DIAGNOSIS — D631 Anemia in chronic kidney disease: Secondary | ICD-10-CM | POA: Insufficient documentation

## 2018-08-16 DIAGNOSIS — Z8614 Personal history of Methicillin resistant Staphylococcus aureus infection: Secondary | ICD-10-CM | POA: Diagnosis not present

## 2018-08-16 DIAGNOSIS — I2510 Atherosclerotic heart disease of native coronary artery without angina pectoris: Secondary | ICD-10-CM

## 2018-08-16 DIAGNOSIS — N189 Chronic kidney disease, unspecified: Secondary | ICD-10-CM | POA: Diagnosis not present

## 2018-08-16 DIAGNOSIS — D509 Iron deficiency anemia, unspecified: Secondary | ICD-10-CM | POA: Diagnosis not present

## 2018-08-16 DIAGNOSIS — Z79899 Other long term (current) drug therapy: Secondary | ICD-10-CM | POA: Diagnosis not present

## 2018-08-16 DIAGNOSIS — C9212 Chronic myeloid leukemia, BCR/ABL-positive, in relapse: Secondary | ICD-10-CM | POA: Diagnosis present

## 2018-08-16 DIAGNOSIS — Z9181 History of falling: Secondary | ICD-10-CM | POA: Insufficient documentation

## 2018-08-16 DIAGNOSIS — Z87891 Personal history of nicotine dependence: Secondary | ICD-10-CM | POA: Diagnosis not present

## 2018-08-16 DIAGNOSIS — C9210 Chronic myeloid leukemia, BCR/ABL-positive, not having achieved remission: Secondary | ICD-10-CM

## 2018-08-16 LAB — CBC WITH DIFFERENTIAL (CANCER CENTER ONLY)
Abs Immature Granulocytes: 0.06 10*3/uL (ref 0.00–0.07)
Basophils Absolute: 0 10*3/uL (ref 0.0–0.1)
Basophils Relative: 0 %
Eosinophils Absolute: 0.3 10*3/uL (ref 0.0–0.5)
Eosinophils Relative: 3 %
HCT: 30.3 % — ABNORMAL LOW (ref 39.0–52.0)
Hemoglobin: 9.7 g/dL — ABNORMAL LOW (ref 13.0–17.0)
Immature Granulocytes: 1 %
Lymphocytes Relative: 8 %
Lymphs Abs: 0.7 10*3/uL (ref 0.7–4.0)
MCH: 26.1 pg (ref 26.0–34.0)
MCHC: 32 g/dL (ref 30.0–36.0)
MCV: 81.7 fL (ref 80.0–100.0)
Monocytes Absolute: 0.7 10*3/uL (ref 0.1–1.0)
Monocytes Relative: 8 %
Neutro Abs: 7 10*3/uL (ref 1.7–7.7)
Neutrophils Relative %: 80 %
Platelet Count: 208 10*3/uL (ref 150–400)
RBC: 3.71 MIL/uL — ABNORMAL LOW (ref 4.22–5.81)
RDW: 18.1 % — ABNORMAL HIGH (ref 11.5–15.5)
WBC Count: 8.8 10*3/uL (ref 4.0–10.5)
nRBC: 0 % (ref 0.0–0.2)

## 2018-08-16 LAB — CMP (CANCER CENTER ONLY)
ALT: 42 U/L (ref 0–44)
AST: 33 U/L (ref 15–41)
Albumin: 2.3 g/dL — ABNORMAL LOW (ref 3.5–5.0)
Alkaline Phosphatase: 105 U/L (ref 38–126)
Anion gap: 11 (ref 5–15)
BUN: 49 mg/dL — ABNORMAL HIGH (ref 8–23)
CO2: 17 mmol/L — ABNORMAL LOW (ref 22–32)
Calcium: 8.3 mg/dL — ABNORMAL LOW (ref 8.9–10.3)
Chloride: 112 mmol/L — ABNORMAL HIGH (ref 98–111)
Creatinine: 3.06 mg/dL (ref 0.61–1.24)
GFR, Est AFR Am: 22 mL/min — ABNORMAL LOW (ref >60)
GFR, Estimated: 19 mL/min — ABNORMAL LOW (ref >60)
Glucose, Bld: 90 mg/dL (ref 70–99)
Potassium: 5 mmol/L (ref 3.5–5.1)
Sodium: 140 mmol/L (ref 135–145)
Total Bilirubin: 0.4 mg/dL (ref 0.3–1.2)
Total Protein: 6.1 g/dL — ABNORMAL LOW (ref 6.5–8.1)

## 2018-08-16 LAB — MAGNESIUM: Magnesium: 1.8 mg/dL (ref 1.7–2.4)

## 2018-08-16 NOTE — Progress Notes (Signed)
[place] OFFICE PROGRESS NOTE   Diagnosis: CML  INTERVAL HISTORY:   [name] returns for follow-up.  He was last seen at the [facility] on 05/28/2018.  He has been hospitalized several times since that visit, most recently 08/05/2018 through 08/09/2018 for severe anemia.  The anemia was felt to be multifactorial in the setting of chronic kidney disease, rectal bleeding and iron deficiency.  He underwent both upper and lower endoscopies with no source of bleeding identified.  He reports falling out of bed last night and sleeping on the floor.  As a result he is "sore all over".  He has fallen out of bed multiple times.  He denies fever, cough, shortness of breath.  No bleeding.  He has occasional diarrhea.  No nausea or vomiting.  Objective:  Vital signs in last 24 hours:  Blood pressure (!) 146/81, pulse (!) 58, temperature 98.4 F (36.9 C), temperature source Oral, resp. rate 18, height 5\' 8"  (1.727 m), SpO2 98 %.    HEENT: Mild white coating over tongue.  No buccal thrush. Resp: Respirations even and unlabored. GI: No splenomegaly. Vascular: No left leg edema. Neuro: Alert and oriented.   Lab Results:  Lab Results  Component Value Date   WBC 8.8 08/16/2018   HGB 9.7 (L) 08/16/2018   HCT 30.3 (L) 08/16/2018   MCV 81.7 08/16/2018   PLT 208 08/16/2018   NEUTROABS 7.0 08/16/2018    Imaging:  No results found.  Medications: I have reviewed the patient's current medications.  Assessment/Plan: 1. Chronic myelogenous leukemia, diagnosed in January of 2009. He remains in hematologic remission. He is taking Gleevec at a dose of 200 mg daily. The peripheral blood PCR was markedly increased in May 2013, likely reflecting medical noncompliance. The peripheral blood PCRwas stable on 05/11/2017.  Trial of dasatinib April/May 2019; discontinued due to poor tolerance with diarrhea and nausea  Gleevec resumed May 2019  [place] placed on hold 11/02/2017  Trial  of dasatinib 50 mg daily approximately 11/29/2017  Dasatinib placed on hold 12/14/2017  Gleevec 100 mg daily beginning 03/21/2018  Bosutinib 200 mg daily beginning12/23/2019 2. 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 3. Status post C3-C4, C4-C5, anterior cervical diskectomy and fusion with allograft and plating 09/30/2010. 4. 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 5. Depression 6. Diarrhea, ? related to chronic pancreatitis versus Gleevec. He takes Lomotil as needed.  7. Indurated facial skin lesion 11/20/2007 with a history of MRSA skin infection of the submental area in May 2008. The induration resolved with doxycycline. 8. Left knee arthroscopy 05/25/2007. 9. Postoperative left knee effusion/pain, likely related to gout. 10. History of gout.  11. Chronic pancreatitis. 12. Status post right above-the-knee amputation. 13. MRSA infection of the submental area May 2008. 14. History of tobacco, alcohol, and cocaine use. 15. History of coronary artery disease. 16. "Shotty" lymphadenopathy of the neck, axilla, and left groin in 2009.  17. History of a microcytic anemia.  18. History of a right olecranon bursa lesion, ? gouty tophus. 19. 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 He previously took AndroGel. 20. History of anemia secondary to chronic disease and Gleevec therapy.  21. history of anorexia-potentially related to [place]. He reports Medicaid would not pay for Megace.  22. Chronic left knee and left foot pain.  23. Status post removal of a left knee screw 07/03/2012. 24. History of hypokalemia. Likely related to diarrhea.He is on a potassium supplement.  25. Status post left foot surgery. 26. Pulsatile fullness right neck. Evaluated by vascular surgery status post CT angiogram 03/07/2014 with no carotid artery aneurysm identified. 27. Cough-abnormal lung exam 05/12/2015. He completed a course of Levaquin.  Chest xray negative. 28. Hospitalization 09/20/2016 through 10/07/2016 with unresponsiveness/acute metabolic encephalopathy 29. Upper endoscopy 11/21/2017-normal esophagus; erythematous mucosa in the antrum which was biopsied; normal examined duodenum.   Disposition: [name] appears unchanged.  He will continue bosutinib.  We will follow-up on the peripheral blood PCR from today.  We reviewed the CBC from today.  Hemoglobin is better as compared to the recent hospitalization.  We will request a physical therapy home consult due to recent falls.  He will return for lab and follow-up in 1 month.  He will contact the office in the interim with any problems.  Patient seen with Dr. [name].    [name] ANP/GNP-BC   08/16/2018  12:01 PM  This was a shared visit with [name].  Mr. appears to have recovered from the recent hospital admission.  He continues bosutinib.  The renal failure predated the use of dasatinib, but has worsened partially over the past few months.  He is on renal dose bosutinib. We will follow-up on the peripheral blood PCR from today.  Julieanne Manson, MD

## 2018-08-17 ENCOUNTER — Telehealth: Payer: Self-pay | Admitting: Oncology

## 2018-08-17 NOTE — Telephone Encounter (Signed)
Scheduled appt per 4/9 los. °

## 2018-08-28 ENCOUNTER — Other Ambulatory Visit: Payer: Self-pay | Admitting: Oncology

## 2018-08-28 DIAGNOSIS — C9210 Chronic myeloid leukemia, BCR/ABL-positive, not having achieved remission: Secondary | ICD-10-CM

## 2018-08-30 MED FILL — BOSULIF 100 MG TABLET: 100 | 30 days supply | Qty: 60 | Fill #0

## 2018-08-31 LAB — BCR/ABL

## 2018-09-03 ENCOUNTER — Encounter (HOSPITAL_COMMUNITY): Payer: Self-pay | Admitting: *Deleted

## 2018-09-03 ENCOUNTER — Emergency Department (HOSPITAL_COMMUNITY)
Admission: EM | Admit: 2018-09-03 | Discharge: 2018-09-03 | Disposition: A | Discharge status: Home / Self Care | Payer: Medicare Other | Attending: Emergency Medicine | Admitting: Emergency Medicine

## 2018-09-03 ENCOUNTER — Other Ambulatory Visit: Payer: Self-pay

## 2018-09-03 DIAGNOSIS — Z8719 Personal history of other diseases of the digestive system: Secondary | ICD-10-CM | POA: Insufficient documentation

## 2018-09-03 DIAGNOSIS — Z5321 Procedure and treatment not carried out due to patient leaving prior to being seen by health care provider: Secondary | ICD-10-CM | POA: Diagnosis not present

## 2018-09-03 DIAGNOSIS — R109 Unspecified abdominal pain: Secondary | ICD-10-CM | POA: Insufficient documentation

## 2018-09-03 LAB — CBC
HCT: 26.8 % — ABNORMAL LOW (ref 39.0–52.0)
Hemoglobin: 8.2 g/dL — ABNORMAL LOW (ref 13.0–17.0)
MCH: 25.2 pg — ABNORMAL LOW (ref 26.0–34.0)
MCHC: 30.6 g/dL (ref 30.0–36.0)
MCV: 82.2 fL (ref 80.0–100.0)
Platelets: 254 10*3/uL (ref 150–400)
RBC: 3.26 MIL/uL — ABNORMAL LOW (ref 4.22–5.81)
RDW: 18.5 % — ABNORMAL HIGH (ref 11.5–15.5)
WBC: 8.7 10*3/uL (ref 4.0–10.5)
nRBC: 0 % (ref 0.0–0.2)

## 2018-09-03 LAB — COMPREHENSIVE METABOLIC PANEL
ALT: 23 U/L (ref 0–44)
AST: 24 U/L (ref 15–41)
Albumin: 2.2 g/dL — ABNORMAL LOW (ref 3.5–5.0)
Alkaline Phosphatase: 115 U/L (ref 38–126)
Anion gap: 13 (ref 5–15)
BUN: 70 mg/dL — ABNORMAL HIGH (ref 8–23)
CO2: 15 mmol/L — ABNORMAL LOW (ref 22–32)
Calcium: 8.4 mg/dL — ABNORMAL LOW (ref 8.9–10.3)
Chloride: 116 mmol/L — ABNORMAL HIGH (ref 98–111)
Creatinine, Ser: 5.88 mg/dL — ABNORMAL HIGH (ref 0.61–1.24)
GFR calc Af Amer: 10 mL/min — ABNORMAL LOW (ref >60)
GFR calc non Af Amer: 9 mL/min — ABNORMAL LOW (ref >60)
Glucose, Bld: 100 mg/dL — ABNORMAL HIGH (ref 70–99)
Potassium: 3.7 mmol/L (ref 3.5–5.1)
Sodium: 144 mmol/L (ref 135–145)
Total Bilirubin: 0.5 mg/dL (ref 0.3–1.2)
Total Protein: 5.8 g/dL — ABNORMAL LOW (ref 6.5–8.1)

## 2018-09-03 LAB — LIPASE, BLOOD: Lipase: 30 U/L (ref 11–51)

## 2018-09-03 MED ORDER — SODIUM CHLORIDE 0.9% FLUSH: 3.0000 mL | Freq: Once | INTRAVENOUS | Status: DC

## 2018-09-03 NOTE — ED Triage Notes (Signed)
C/o increased abd. Pain onset 1 week ago ,states pain is worse after he eats. States he has a history of pancreatitis.

## 2018-09-08 ENCOUNTER — Inpatient Hospital Stay (HOSPITAL_COMMUNITY)
Admission: EM | Admit: 2018-09-08 | Discharge: 2018-09-13 | DRG: 683 | Disposition: A | Discharge status: Home Health | Payer: Medicare Other | Attending: Internal Medicine | Admitting: Internal Medicine

## 2018-09-08 ENCOUNTER — Emergency Department (HOSPITAL_COMMUNITY): Payer: Medicare Other

## 2018-09-08 ENCOUNTER — Other Ambulatory Visit: Payer: Self-pay

## 2018-09-08 ENCOUNTER — Encounter (HOSPITAL_COMMUNITY): Payer: Self-pay | Admitting: Emergency Medicine

## 2018-09-08 DIAGNOSIS — G40909 Epilepsy, unspecified, not intractable, without status epilepticus: Secondary | ICD-10-CM | POA: Diagnosis present

## 2018-09-08 DIAGNOSIS — L89322 Pressure ulcer of left buttock, stage 2: Secondary | ICD-10-CM | POA: Diagnosis present

## 2018-09-08 DIAGNOSIS — Z9861 Coronary angioplasty status: Secondary | ICD-10-CM | POA: Diagnosis not present

## 2018-09-08 DIAGNOSIS — N179 Acute kidney failure, unspecified: Principal | ICD-10-CM | POA: Diagnosis present

## 2018-09-08 DIAGNOSIS — B9620 Unspecified Escherichia coli [E. coli] as the cause of diseases classified elsewhere: Secondary | ICD-10-CM | POA: Diagnosis present

## 2018-09-08 DIAGNOSIS — N3001 Acute cystitis with hematuria: Secondary | ICD-10-CM

## 2018-09-08 DIAGNOSIS — Z79899 Other long term (current) drug therapy: Secondary | ICD-10-CM

## 2018-09-08 DIAGNOSIS — Z993 Dependence on wheelchair: Secondary | ICD-10-CM | POA: Diagnosis not present

## 2018-09-08 DIAGNOSIS — Z8673 Personal history of transient ischemic attack (TIA), and cerebral infarction without residual deficits: Secondary | ICD-10-CM

## 2018-09-08 DIAGNOSIS — I10 Essential (primary) hypertension: Secondary | ICD-10-CM | POA: Diagnosis not present

## 2018-09-08 DIAGNOSIS — M1A09X0 Idiopathic chronic gout, multiple sites, without tophus (tophi): Secondary | ICD-10-CM | POA: Diagnosis present

## 2018-09-08 DIAGNOSIS — I129 Hypertensive chronic kidney disease with stage 1 through stage 4 chronic kidney disease, or unspecified chronic kidney disease: Secondary | ICD-10-CM | POA: Diagnosis present

## 2018-09-08 DIAGNOSIS — Z20828 Contact with and (suspected) exposure to other viral communicable diseases: Secondary | ICD-10-CM | POA: Diagnosis present

## 2018-09-08 DIAGNOSIS — N19 Unspecified kidney failure: Secondary | ICD-10-CM

## 2018-09-08 DIAGNOSIS — R64 Cachexia: Secondary | ICD-10-CM | POA: Diagnosis present

## 2018-09-08 DIAGNOSIS — N390 Urinary tract infection, site not specified: Secondary | ICD-10-CM | POA: Diagnosis present

## 2018-09-08 DIAGNOSIS — C9210 Chronic myeloid leukemia, BCR/ABL-positive, not having achieved remission: Secondary | ICD-10-CM | POA: Diagnosis present

## 2018-09-08 DIAGNOSIS — I252 Old myocardial infarction: Secondary | ICD-10-CM | POA: Diagnosis not present

## 2018-09-08 DIAGNOSIS — E876 Hypokalemia: Secondary | ICD-10-CM | POA: Diagnosis not present

## 2018-09-08 DIAGNOSIS — A084 Viral intestinal infection, unspecified: Secondary | ICD-10-CM | POA: Diagnosis present

## 2018-09-08 DIAGNOSIS — J449 Chronic obstructive pulmonary disease, unspecified: Secondary | ICD-10-CM | POA: Diagnosis present

## 2018-09-08 DIAGNOSIS — L8990 Pressure ulcer of unspecified site, unspecified stage: Secondary | ICD-10-CM

## 2018-09-08 DIAGNOSIS — R569 Unspecified convulsions: Secondary | ICD-10-CM

## 2018-09-08 DIAGNOSIS — R41841 Cognitive communication deficit: Secondary | ICD-10-CM | POA: Diagnosis present

## 2018-09-08 DIAGNOSIS — R197 Diarrhea, unspecified: Secondary | ICD-10-CM | POA: Diagnosis not present

## 2018-09-08 DIAGNOSIS — Z87891 Personal history of nicotine dependence: Secondary | ICD-10-CM

## 2018-09-08 DIAGNOSIS — Z7951 Long term (current) use of inhaled steroids: Secondary | ICD-10-CM

## 2018-09-08 DIAGNOSIS — D630 Anemia in neoplastic disease: Secondary | ICD-10-CM | POA: Diagnosis present

## 2018-09-08 DIAGNOSIS — K861 Other chronic pancreatitis: Secondary | ICD-10-CM | POA: Diagnosis present

## 2018-09-08 DIAGNOSIS — Z91041 Radiographic dye allergy status: Secondary | ICD-10-CM

## 2018-09-08 DIAGNOSIS — N189 Chronic kidney disease, unspecified: Secondary | ICD-10-CM

## 2018-09-08 DIAGNOSIS — Z89611 Acquired absence of right leg above knee: Secondary | ICD-10-CM | POA: Diagnosis not present

## 2018-09-08 DIAGNOSIS — Z6820 Body mass index (BMI) 20.0-20.9, adult: Secondary | ICD-10-CM | POA: Diagnosis not present

## 2018-09-08 DIAGNOSIS — R112 Nausea with vomiting, unspecified: Secondary | ICD-10-CM | POA: Diagnosis present

## 2018-09-08 DIAGNOSIS — E872 Acidosis: Secondary | ICD-10-CM | POA: Diagnosis present

## 2018-09-08 DIAGNOSIS — N184 Chronic kidney disease, stage 4 (severe): Secondary | ICD-10-CM | POA: Diagnosis present

## 2018-09-08 DIAGNOSIS — A09 Infectious gastroenteritis and colitis, unspecified: Secondary | ICD-10-CM

## 2018-09-08 DIAGNOSIS — D649 Anemia, unspecified: Secondary | ICD-10-CM

## 2018-09-08 DIAGNOSIS — D631 Anemia in chronic kidney disease: Secondary | ICD-10-CM | POA: Diagnosis present

## 2018-09-08 DIAGNOSIS — Z8249 Family history of ischemic heart disease and other diseases of the circulatory system: Secondary | ICD-10-CM

## 2018-09-08 LAB — URINALYSIS, ROUTINE W REFLEX MICROSCOPIC
Bilirubin Urine: NEGATIVE
Glucose, UA: NEGATIVE mg/dL
Ketones, ur: NEGATIVE mg/dL
Nitrite: NEGATIVE
Protein, ur: 100 mg/dL — AB
Specific Gravity, Urine: 1.011 (ref 1.005–1.030)
WBC, UA: >50 WBC/hpf — ABNORMAL HIGH (ref 0–5)
pH: 5 (ref 5.0–8.0)

## 2018-09-08 LAB — HEMOGLOBIN AND HEMATOCRIT, BLOOD
HCT: 28.3 % — ABNORMAL LOW (ref 39.0–52.0)
Hemoglobin: 8.8 g/dL — ABNORMAL LOW (ref 13.0–17.0)

## 2018-09-08 LAB — COMPREHENSIVE METABOLIC PANEL
ALT: 33 U/L (ref 0–44)
AST: 37 U/L (ref 15–41)
Albumin: 2.4 g/dL — ABNORMAL LOW (ref 3.5–5.0)
Alkaline Phosphatase: 148 U/L — ABNORMAL HIGH (ref 38–126)
Anion gap: 11 (ref 5–15)
BUN: 85 mg/dL — ABNORMAL HIGH (ref 8–23)
CO2: 16 mmol/L — ABNORMAL LOW (ref 22–32)
Calcium: 8.2 mg/dL — ABNORMAL LOW (ref 8.9–10.3)
Chloride: 112 mmol/L — ABNORMAL HIGH (ref 98–111)
Creatinine, Ser: 6.41 mg/dL — ABNORMAL HIGH (ref 0.61–1.24)
GFR calc Af Amer: 9 mL/min — ABNORMAL LOW (ref >60)
GFR calc non Af Amer: 8 mL/min — ABNORMAL LOW (ref >60)
Glucose, Bld: 106 mg/dL — ABNORMAL HIGH (ref 70–99)
Potassium: 3.3 mmol/L — ABNORMAL LOW (ref 3.5–5.1)
Sodium: 139 mmol/L (ref 135–145)
Total Bilirubin: 0.4 mg/dL (ref 0.3–1.2)
Total Protein: 5.5 g/dL — ABNORMAL LOW (ref 6.5–8.1)

## 2018-09-08 LAB — CBC WITH DIFFERENTIAL/PLATELET
Abs Immature Granulocytes: 0.09 10*3/uL — ABNORMAL HIGH (ref 0.00–0.07)
Basophils Absolute: 0 10*3/uL (ref 0.0–0.1)
Basophils Relative: 0 %
Eosinophils Absolute: 1.2 10*3/uL — ABNORMAL HIGH (ref 0.0–0.5)
Eosinophils Relative: 7 %
HCT: 17.6 % — ABNORMAL LOW (ref 39.0–52.0)
Hemoglobin: 5.4 g/dL — CL (ref 13.0–17.0)
Immature Granulocytes: 1 %
Lymphocytes Relative: 12 %
Lymphs Abs: 1.9 10*3/uL (ref 0.7–4.0)
MCH: 25.6 pg — ABNORMAL LOW (ref 26.0–34.0)
MCHC: 30.7 g/dL (ref 30.0–36.0)
MCV: 83.4 fL (ref 80.0–100.0)
Monocytes Absolute: 1.3 10*3/uL — ABNORMAL HIGH (ref 0.1–1.0)
Monocytes Relative: 8 %
Neutro Abs: 11.6 10*3/uL — ABNORMAL HIGH (ref 1.7–7.7)
Neutrophils Relative %: 72 %
Platelets: 294 10*3/uL (ref 150–400)
RBC: 2.11 MIL/uL — ABNORMAL LOW (ref 4.22–5.81)
RDW: 19.3 % — ABNORMAL HIGH (ref 11.5–15.5)
WBC: 16.1 10*3/uL — ABNORMAL HIGH (ref 4.0–10.5)
nRBC: 0 % (ref 0.0–0.2)

## 2018-09-08 LAB — POCT I-STAT EG7
Acid-base deficit: 11 mmol/L — ABNORMAL HIGH (ref 0.0–2.0)
Bicarbonate: 15.2 mmol/L — ABNORMAL LOW (ref 20.0–28.0)
Calcium, Ion: 1.18 mmol/L (ref 1.15–1.40)
HCT: 28 % — ABNORMAL LOW (ref 39.0–52.0)
Hemoglobin: 9.5 g/dL — ABNORMAL LOW (ref 13.0–17.0)
O2 Saturation: 55 %
Potassium: 3.5 mmol/L (ref 3.5–5.1)
Sodium: 144 mmol/L (ref 135–145)
TCO2: 16 mmol/L — ABNORMAL LOW (ref 22–32)
pCO2, Ven: 34.9 mmHg — ABNORMAL LOW (ref 44.0–60.0)
pH, Ven: 7.248 — ABNORMAL LOW (ref 7.250–7.430)
pO2, Ven: 33 mmHg (ref 32.0–45.0)

## 2018-09-08 LAB — CREATININE, URINE, RANDOM: Creatinine, Urine: 102.96 mg/dL

## 2018-09-08 LAB — I-STAT CREATININE, ED: Creatinine, Ser: 6.6 mg/dL — ABNORMAL HIGH (ref 0.61–1.24)

## 2018-09-08 LAB — LIPASE, BLOOD: Lipase: 61 U/L — ABNORMAL HIGH (ref 11–51)

## 2018-09-08 LAB — URIC ACID: Uric Acid, Serum: 16 mg/dL — ABNORMAL HIGH (ref 3.7–8.6)

## 2018-09-08 LAB — PHOSPHORUS: Phosphorus: 5.5 mg/dL — ABNORMAL HIGH (ref 2.5–4.6)

## 2018-09-08 LAB — SODIUM, URINE, RANDOM: Sodium, Ur: 55 mmol/L

## 2018-09-08 LAB — SARS CORONAVIRUS 2 BY RT PCR (HOSPITAL ORDER, PERFORMED IN ~~LOC~~ HOSPITAL LAB): SARS Coronavirus 2: NEGATIVE

## 2018-09-08 LAB — MAGNESIUM: Magnesium: 2.2 mg/dL (ref 1.7–2.4)

## 2018-09-08 MED ORDER — HYDRALAZINE HCL 50 MG PO TABS
100.0000 mg | ORAL_TABLET | Freq: Three times a day (TID) | ORAL | Status: DC
  Administered 2018-09-09 – 2018-09-13 (×13): 100 mg via ORAL
  Filled 2018-09-08 (×13): qty 2

## 2018-09-08 MED ORDER — ISOSORBIDE DINITRATE 20 MG PO TABS
40.0000 mg | ORAL_TABLET | Freq: Three times a day (TID) | ORAL | Status: DC
  Administered 2018-09-09 – 2018-09-13 (×13): 40 mg via ORAL
  Filled 2018-09-08: qty 2
  Filled 2018-09-08: qty 4
  Filled 2018-09-08: qty 2
  Filled 2018-09-08 (×2): qty 4
  Filled 2018-09-08 (×4): qty 2
  Filled 2018-09-08 (×3): qty 4
  Filled 2018-09-08 (×4): qty 2
  Filled 2018-09-08: qty 4
  Filled 2018-09-08 (×3): qty 2
  Filled 2018-09-08 (×4): qty 4
  Filled 2018-09-08 (×2): qty 2

## 2018-09-08 MED ORDER — HYDROMORPHONE HCL 1 MG/ML IJ SOLN
1.0000 mg | Freq: Once | INTRAMUSCULAR | Status: AC
  Administered 2018-09-08: 1 mg via INTRAVENOUS
  Filled 2018-09-08: qty 1

## 2018-09-08 MED ORDER — ONDANSETRON HCL 4 MG/2ML IJ SOLN: 4.0000 mg | Freq: Four times a day (QID) | INTRAMUSCULAR | Status: DC | PRN

## 2018-09-08 MED ORDER — ALBUTEROL SULFATE HFA 108 (90 BASE) MCG/ACT IN AERS: 2.0000 | INHALATION_SPRAY | Freq: Four times a day (QID) | RESPIRATORY_TRACT | Status: DC | PRN

## 2018-09-08 MED ORDER — SODIUM CHLORIDE 0.9 % IV BOLUS: 1000.0000 mL | Freq: Once | INTRAVENOUS | Status: DC

## 2018-09-08 MED ORDER — HEPARIN SODIUM (PORCINE) 5000 UNIT/ML IJ SOLN
5000.0000 [IU] | Freq: Three times a day (TID) | INTRAMUSCULAR | Status: DC
  Administered 2018-09-09 – 2018-09-13 (×13): 5000 [IU] via SUBCUTANEOUS
  Filled 2018-09-08 (×12): qty 1

## 2018-09-08 MED ORDER — LEVETIRACETAM 500 MG PO TABS
500.0000 mg | ORAL_TABLET | Freq: Two times a day (BID) | ORAL | Status: DC
  Administered 2018-09-09 – 2018-09-13 (×10): 500 mg via ORAL
  Filled 2018-09-08 (×10): qty 1

## 2018-09-08 MED ORDER — AMLODIPINE BESYLATE 10 MG PO TABS
10.0000 mg | ORAL_TABLET | Freq: Every day | ORAL | Status: DC
  Administered 2018-09-09 – 2018-09-13 (×5): 10 mg via ORAL
  Filled 2018-09-08 (×5): qty 1

## 2018-09-08 MED ORDER — SIMVASTATIN 20 MG PO TABS
40.0000 mg | ORAL_TABLET | Freq: Every day | ORAL | Status: DC
  Administered 2018-09-09 – 2018-09-13 (×5): 40 mg via ORAL
  Filled 2018-09-08 (×5): qty 2

## 2018-09-08 MED ORDER — ONDANSETRON HCL 4 MG/2ML IJ SOLN
4.0000 mg | Freq: Once | INTRAMUSCULAR | Status: AC
  Administered 2018-09-08: 4 mg via INTRAVENOUS
  Filled 2018-09-08: qty 2

## 2018-09-08 MED ORDER — SODIUM CHLORIDE 0.9 % IV SOLN
1.0000 g | INTRAVENOUS | Status: DC
  Administered 2018-09-09 – 2018-09-11 (×4): 1 g via INTRAVENOUS
  Filled 2018-09-08 (×5): qty 10

## 2018-09-08 MED ORDER — NICOTINE 21 MG/24HR TD PT24
21.0000 mg | MEDICATED_PATCH | Freq: Every day | TRANSDERMAL | Status: DC
  Administered 2018-09-09 – 2018-09-13 (×5): 21 mg via TRANSDERMAL
  Filled 2018-09-08 (×5): qty 1

## 2018-09-08 MED ORDER — ONDANSETRON HCL 4 MG PO TABS: 4.0000 mg | ORAL_TABLET | Freq: Four times a day (QID) | ORAL | Status: DC | PRN

## 2018-09-08 MED ORDER — MEGESTROL ACETATE 40 MG PO TABS
40.0000 mg | ORAL_TABLET | Freq: Every day | ORAL | Status: DC
  Administered 2018-09-09 – 2018-09-13 (×5): 40 mg via ORAL
  Filled 2018-09-08 (×6): qty 1

## 2018-09-08 MED ORDER — GABAPENTIN 100 MG PO CAPS
100.0000 mg | ORAL_CAPSULE | Freq: Every day | ORAL | Status: DC
  Administered 2018-09-09 – 2018-09-13 (×5): 100 mg via ORAL
  Filled 2018-09-08 (×5): qty 1

## 2018-09-08 MED ORDER — ALBUTEROL SULFATE (2.5 MG/3ML) 0.083% IN NEBU: 2.5000 mg | INHALATION_SOLUTION | Freq: Four times a day (QID) | RESPIRATORY_TRACT | Status: DC | PRN

## 2018-09-08 MED ORDER — CLONIDINE HCL 0.2 MG PO TABS
0.2000 mg | ORAL_TABLET | Freq: Three times a day (TID) | ORAL | Status: DC
  Administered 2018-09-09 – 2018-09-13 (×14): 0.2 mg via ORAL
  Filled 2018-09-08 (×14): qty 1

## 2018-09-08 MED ORDER — SODIUM CHLORIDE 0.9 % IV SOLN: 10.0000 mL/h | Freq: Once | INTRAVENOUS | Status: DC

## 2018-09-08 MED ORDER — STERILE WATER FOR INJECTION IV SOLN
INTRAVENOUS | Status: DC
  Administered 2018-09-09 (×2): via INTRAVENOUS
  Filled 2018-09-08 (×2): qty 850

## 2018-09-08 MED ORDER — METRONIDAZOLE IN NACL 5-0.79 MG/ML-% IV SOLN
500.0000 mg | Freq: Once | INTRAVENOUS | Status: AC
  Administered 2018-09-08: 500 mg via INTRAVENOUS
  Filled 2018-09-08: qty 100

## 2018-09-08 MED ORDER — LEVETIRACETAM IN NACL 500 MG/100ML IV SOLN
500.0000 mg | Freq: Two times a day (BID) | INTRAVENOUS | Status: DC
  Filled 2018-09-08: qty 100

## 2018-09-08 MED ORDER — MOMETASONE FURO-FORMOTEROL FUM 100-5 MCG/ACT IN AERO
2.0000 | INHALATION_SPRAY | Freq: Two times a day (BID) | RESPIRATORY_TRACT | Status: DC
  Administered 2018-09-09 – 2018-09-13 (×8): 2 via RESPIRATORY_TRACT
  Filled 2018-09-08 (×2): qty 8.8

## 2018-09-08 MED ORDER — POTASSIUM CHLORIDE 10 MEQ/100ML IV SOLN: 10.0000 meq | INTRAVENOUS | Status: DC

## 2018-09-08 MED ORDER — SODIUM CHLORIDE 0.9 % IV SOLN
1.0000 g | Freq: Once | INTRAVENOUS | Status: AC
  Administered 2018-09-08: 1 g via INTRAVENOUS
  Filled 2018-09-08: qty 1

## 2018-09-08 MED ORDER — PANTOPRAZOLE SODIUM 40 MG PO PACK
40.0000 mg | PACK | Freq: Two times a day (BID) | ORAL | Status: DC
  Administered 2018-09-09 – 2018-09-13 (×9): 40 mg via ORAL
  Filled 2018-09-08 (×9): qty 20

## 2018-09-08 MED ORDER — ACETAMINOPHEN 650 MG RE SUPP: 650.0000 mg | Freq: Four times a day (QID) | RECTAL | Status: DC | PRN

## 2018-09-08 MED ORDER — CARVEDILOL 25 MG PO TABS
50.0000 mg | ORAL_TABLET | Freq: Two times a day (BID) | ORAL | Status: DC
  Administered 2018-09-09 – 2018-09-13 (×9): 50 mg via ORAL
  Filled 2018-09-08 (×9): qty 2

## 2018-09-08 MED ORDER — OLANZAPINE 2.5 MG PO TABS
2.5000 mg | ORAL_TABLET | Freq: Every day | ORAL | Status: DC
  Administered 2018-09-09 – 2018-09-12 (×4): 2.5 mg via ORAL
  Filled 2018-09-08 (×5): qty 1

## 2018-09-08 MED ORDER — SODIUM BICARBONATE 650 MG PO TABS: 650.0000 mg | ORAL_TABLET | Freq: Two times a day (BID) | ORAL | Status: DC

## 2018-09-08 MED ORDER — ACETAMINOPHEN 325 MG PO TABS
650.0000 mg | ORAL_TABLET | Freq: Four times a day (QID) | ORAL | Status: DC | PRN
  Administered 2018-09-10 – 2018-09-12 (×3): 650 mg via ORAL
  Filled 2018-09-08 (×3): qty 2

## 2018-09-08 MED ORDER — SODIUM CHLORIDE 0.9 % IV BOLUS
1000.0000 mL | Freq: Once | INTRAVENOUS | Status: AC
  Administered 2018-09-08: 1000 mL via INTRAVENOUS

## 2018-09-08 MED ORDER — POTASSIUM CHLORIDE CRYS ER 20 MEQ PO TBCR
40.0000 meq | EXTENDED_RELEASE_TABLET | Freq: Once | ORAL | Status: AC
  Administered 2018-09-09: 40 meq via ORAL
  Filled 2018-09-08: qty 2

## 2018-09-08 NOTE — ED Triage Notes (Signed)
Pt arrives [facility] with c/o N/V X 2days. Diarrhea X1 week. Pt states he has lukemia and takes a chemo pill once a day. Alert and oriented X4 . Pt only complains of butt pain

## 2018-09-08 NOTE — ED Notes (Signed)
Admitting at bedside 

## 2018-09-08 NOTE — ED Notes (Signed)
ED Provider at bedside. 
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
## 2018-09-08 NOTE — ED Notes (Signed)
Patient found on the floor at the foot of bed by EMT; pt states he hit his head; pt a&ox 4 and was placed back in the bed; MD notified and reassessing patient; Charge,RN notified-Monique,RN
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
## 2018-09-08 NOTE — ED Notes (Signed)
Patient was halfway off the bed when RN went in to administer medications and fluids; pt refused tx and states he is ready to go; Md notified to come see the patient; Pt did not appear to be steady-Monique,RN

## 2018-09-08 NOTE — ED Notes (Signed)
Nurse collecting labs. 

## 2018-09-08 NOTE — ED Notes (Signed)
Patient transported to CT 

## 2018-09-09 ENCOUNTER — Inpatient Hospital Stay (HOSPITAL_COMMUNITY): Payer: Medicare Other

## 2018-09-09 ENCOUNTER — Encounter (HOSPITAL_COMMUNITY): Payer: Self-pay | Admitting: General Practice

## 2018-09-09 LAB — COMPREHENSIVE METABOLIC PANEL
ALT: 33 U/L (ref 0–44)
AST: 37 U/L (ref 15–41)
Albumin: 2.4 g/dL — ABNORMAL LOW (ref 3.5–5.0)
Alkaline Phosphatase: 139 U/L — ABNORMAL HIGH (ref 38–126)
Anion gap: 13 (ref 5–15)
BUN: 83 mg/dL — ABNORMAL HIGH (ref 8–23)
CO2: 12 mmol/L — ABNORMAL LOW (ref 22–32)
Calcium: 8 mg/dL — ABNORMAL LOW (ref 8.9–10.3)
Chloride: 117 mmol/L — ABNORMAL HIGH (ref 98–111)
Creatinine, Ser: 6.07 mg/dL — ABNORMAL HIGH (ref 0.61–1.24)
GFR calc Af Amer: 10 mL/min — ABNORMAL LOW (ref >60)
GFR calc non Af Amer: 8 mL/min — ABNORMAL LOW (ref >60)
Glucose, Bld: 98 mg/dL (ref 70–99)
Potassium: 3.5 mmol/L (ref 3.5–5.1)
Sodium: 142 mmol/L (ref 135–145)
Total Bilirubin: 0.3 mg/dL (ref 0.3–1.2)
Total Protein: 5.8 g/dL — ABNORMAL LOW (ref 6.5–8.1)

## 2018-09-09 LAB — CBC
HCT: 30.8 % — ABNORMAL LOW (ref 39.0–52.0)
Hemoglobin: 9.4 g/dL — ABNORMAL LOW (ref 13.0–17.0)
MCH: 25.1 pg — ABNORMAL LOW (ref 26.0–34.0)
MCHC: 30.5 g/dL (ref 30.0–36.0)
MCV: 82.4 fL (ref 80.0–100.0)
Platelets: 201 10*3/uL (ref 150–400)
RBC: 3.74 MIL/uL — ABNORMAL LOW (ref 4.22–5.81)
RDW: 18.8 % — ABNORMAL HIGH (ref 11.5–15.5)
WBC: 12 10*3/uL — ABNORMAL HIGH (ref 4.0–10.5)
nRBC: 0 % (ref 0.0–0.2)

## 2018-09-09 MED ORDER — SODIUM CHLORIDE 0.9 % IV BOLUS
1000.0000 mL | Freq: Once | INTRAVENOUS | Status: AC
  Administered 2018-09-09: 1000 mL via INTRAVENOUS

## 2018-09-09 MED ORDER — STERILE WATER FOR INJECTION IV SOLN
INTRAVENOUS | Status: DC
  Administered 2018-09-09 – 2018-09-10 (×2): via INTRAVENOUS
  Filled 2018-09-09 (×3): qty 850
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
## 2018-09-09 NOTE — ED Provider Notes (Signed)
Regarding patient's ED visit on 09-03-2018, it appears that patient had labs drawn, was temporarily shown to be in a ED room in the EMR, then was brought back to the waiting room.  Labs and discharge orders are under my name.  Given that I have no recollection of this patient encounter and based off of the ED timeline, it is my belief that he left the waiting room prior to my evaluation.  Labs did reveal possible AKI according to my current chart review.  He has since been evaluated by an MD.  Barth Kirks. [place], [place] [email or web address]    Maudie Flakes, MD 09/09/18 [id number]
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
## 2018-09-10 LAB — BASIC METABOLIC PANEL
Anion gap: 9 (ref 5–15)
Anion gap: 10 (ref 5–15)
BUN: 69 mg/dL — ABNORMAL HIGH (ref 8–23)
BUN: 64 mg/dL — ABNORMAL HIGH (ref 8–23)
CO2: 22 mmol/L (ref 22–32)
CO2: 22 mmol/L (ref 22–32)
Calcium: 7.3 mg/dL — ABNORMAL LOW (ref 8.9–10.3)
Calcium: 7.5 mg/dL — ABNORMAL LOW (ref 8.9–10.3)
Chloride: 113 mmol/L — ABNORMAL HIGH (ref 98–111)
Chloride: 111 mmol/L (ref 98–111)
Creatinine, Ser: 5.02 mg/dL — ABNORMAL HIGH (ref 0.61–1.24)
Creatinine, Ser: 4.75 mg/dL — ABNORMAL HIGH (ref 0.61–1.24)
GFR calc Af Amer: 12 mL/min — ABNORMAL LOW (ref >60)
GFR calc Af Amer: 13 mL/min — ABNORMAL LOW (ref >60)
GFR calc non Af Amer: 11 mL/min — ABNORMAL LOW (ref >60)
GFR calc non Af Amer: 11 mL/min — ABNORMAL LOW (ref >60)
Glucose, Bld: 103 mg/dL — ABNORMAL HIGH (ref 70–99)
Glucose, Bld: 99 mg/dL (ref 70–99)
Potassium: 2.5 mmol/L — CL (ref 3.5–5.1)
Potassium: 3 mmol/L — ABNORMAL LOW (ref 3.5–5.1)
Sodium: 144 mmol/L (ref 135–145)
Sodium: 143 mmol/L (ref 135–145)

## 2018-09-10 LAB — C DIFFICILE QUICK SCREEN W PCR REFLEX
C Diff antigen: NEGATIVE
C Diff interpretation: NOT DETECTED
C Diff toxin: NEGATIVE

## 2018-09-10 LAB — IRON AND TIBC
Iron: 113 ug/dL (ref 45–182)
Saturation Ratios: 83 % — ABNORMAL HIGH (ref 17.9–39.5)
TIBC: 136 ug/dL — ABNORMAL LOW (ref 250–450)
UIBC: 23 ug/dL

## 2018-09-10 LAB — CBC
HCT: 20.6 % — ABNORMAL LOW (ref 39.0–52.0)
Hemoglobin: 6.7 g/dL — CL (ref 13.0–17.0)
MCH: 25.3 pg — ABNORMAL LOW (ref 26.0–34.0)
MCHC: 32.5 g/dL (ref 30.0–36.0)
MCV: 77.7 fL — ABNORMAL LOW (ref 80.0–100.0)
Platelets: 134 10*3/uL — ABNORMAL LOW (ref 150–400)
RBC: 2.65 MIL/uL — ABNORMAL LOW (ref 4.22–5.81)
RDW: 18.6 % — ABNORMAL HIGH (ref 11.5–15.5)
WBC: 9.1 10*3/uL (ref 4.0–10.5)
nRBC: 0 % (ref 0.0–0.2)

## 2018-09-10 LAB — VITAMIN B12: Vitamin B-12: 720 pg/mL (ref 180–914)

## 2018-09-10 LAB — RETICULOCYTES
Immature Retic Fract: 11.4 % (ref 2.3–15.9)
RBC.: 3.15 MIL/uL — ABNORMAL LOW (ref 4.22–5.81)
Retic Count, Absolute: 100.8 10*3/uL (ref 19.0–186.0)
Retic Ct Pct: 3.2 % — ABNORMAL HIGH (ref 0.4–3.1)

## 2018-09-10 LAB — FOLATE: Folate: 9.4 ng/mL (ref >5.9)

## 2018-09-10 LAB — FERRITIN: Ferritin: 137 ng/mL (ref 24–336)

## 2018-09-10 LAB — UREA NITROGEN, URINE: Urea Nitrogen, Ur: 459 mg/dL

## 2018-09-10 LAB — HEMOGLOBIN AND HEMATOCRIT, BLOOD
HCT: 20.7 % — ABNORMAL LOW (ref 39.0–52.0)
HCT: 24.5 % — ABNORMAL LOW (ref 39.0–52.0)
Hemoglobin: 6.7 g/dL — CL (ref 13.0–17.0)
Hemoglobin: 8.1 g/dL — ABNORMAL LOW (ref 13.0–17.0)

## 2018-09-10 LAB — PREPARE RBC (CROSSMATCH)

## 2018-09-10 MED ORDER — SODIUM CHLORIDE 0.9% IV SOLUTION
Freq: Once | INTRAVENOUS | Status: AC
  Administered 2018-09-10: 08:00:00 via INTRAVENOUS

## 2018-09-10 MED ORDER — POTASSIUM CHLORIDE 10 MEQ/100ML IV SOLN
10.0000 meq | INTRAVENOUS | Status: AC
  Administered 2018-09-10 (×3): 10 meq via INTRAVENOUS
  Filled 2018-09-10 (×3): qty 100

## 2018-09-10 MED ORDER — SODIUM CHLORIDE 0.45 % IV SOLN
INTRAVENOUS | Status: DC
  Administered 2018-09-10 – 2018-09-11 (×2): via INTRAVENOUS

## 2018-09-10 MED ORDER — POTASSIUM CHLORIDE 10 MEQ/100ML IV SOLN
10.0000 meq | INTRAVENOUS | Status: AC
  Administered 2018-09-10 (×4): 10 meq via INTRAVENOUS
  Filled 2018-09-10 (×4): qty 100

## 2018-09-10 NOTE — Progress Notes (Signed)
This RN and Hydrologist spoke with daughter Lajean Saver via phone at [facility] to see if it was okay to give pt 1 unit of blood due to hemoglobin being low and pt not being alert and oriented x4. Daughter stated " if he needs it, then give it." Consent was signed by this RN and Nichola.  Eleanora Neighbor, RN

## 2018-09-10 NOTE — Progress Notes (Signed)
Spoke with pharmacist, Sodium Bicarb is compatible with Potassium.  Eleanora Neighbor, RN

## 2018-09-10 NOTE — Progress Notes (Signed)
Critical Lab: Potassium 2.5 Messaged MD at this time.   Eleanora Neighbor, RN
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
## 2018-09-10 NOTE — Progress Notes (Addendum)
Lab is coming to do a repeat H/H d/t to possible inaccurate results. Lab had to draw 3 different times yesterday with drastically different results. HGB was 9.3 and is now saying 6.7. Made NP on call aware. Pt is stable, vitals WNL, not actively bleeding.   Eleanora Neighbor, RN

## 2018-09-10 NOTE — Progress Notes (Signed)
Critical HGB 6.7, Messaged MD at this time.   Eleanora Neighbor, RN

## 2018-09-11 LAB — BPAM RBC
Blood Product Expiration Date: 202005192359
ISSUE DATE / TIME: 202005040811
Unit Type and Rh: 5100

## 2018-09-11 LAB — TYPE AND SCREEN
ABO/RH(D): O POS
Antibody Screen: NEGATIVE
Unit division: 0

## 2018-09-11 LAB — CBC
HCT: 24.3 % — ABNORMAL LOW (ref 39.0–52.0)
Hemoglobin: 8 g/dL — ABNORMAL LOW (ref 13.0–17.0)
MCH: 26.1 pg (ref 26.0–34.0)
MCHC: 32.9 g/dL (ref 30.0–36.0)
MCV: 79.4 fL — ABNORMAL LOW (ref 80.0–100.0)
Platelets: 115 10*3/uL — ABNORMAL LOW (ref 150–400)
RBC: 3.06 MIL/uL — ABNORMAL LOW (ref 4.22–5.81)
RDW: 18.3 % — ABNORMAL HIGH (ref 11.5–15.5)
WBC: 10.1 10*3/uL (ref 4.0–10.5)
nRBC: 0 % (ref 0.0–0.2)

## 2018-09-11 LAB — BASIC METABOLIC PANEL
Anion gap: 13 (ref 5–15)
BUN: 57 mg/dL — ABNORMAL HIGH (ref 8–23)
CO2: 20 mmol/L — ABNORMAL LOW (ref 22–32)
Calcium: 7.7 mg/dL — ABNORMAL LOW (ref 8.9–10.3)
Chloride: 109 mmol/L (ref 98–111)
Creatinine, Ser: 4.44 mg/dL — ABNORMAL HIGH (ref 0.61–1.24)
GFR calc Af Amer: 14 mL/min — ABNORMAL LOW (ref >60)
GFR calc non Af Amer: 12 mL/min — ABNORMAL LOW (ref >60)
Glucose, Bld: 92 mg/dL (ref 70–99)
Potassium: 3.3 mmol/L — ABNORMAL LOW (ref 3.5–5.1)
Sodium: 142 mmol/L (ref 135–145)

## 2018-09-11 LAB — URINE CULTURE: Culture: >=100000 — AB

## 2018-09-11 MED ORDER — CEFDINIR 300 MG PO CAPS
300.0000 mg | ORAL_CAPSULE | Freq: Every day | ORAL | Status: DC
  Administered 2018-09-12 – 2018-09-13 (×2): 300 mg via ORAL
  Filled 2018-09-11 (×2): qty 1

## 2018-09-11 MED ORDER — SODIUM BICARBONATE 650 MG PO TABS
650.0000 mg | ORAL_TABLET | Freq: Three times a day (TID) | ORAL | Status: DC
  Administered 2018-09-11 – 2018-09-13 (×7): 650 mg via ORAL
  Filled 2018-09-11 (×7): qty 1

## 2018-09-11 MED ORDER — POTASSIUM CHLORIDE CRYS ER 20 MEQ PO TBCR
40.0000 meq | EXTENDED_RELEASE_TABLET | Freq: Two times a day (BID) | ORAL | Status: AC
  Administered 2018-09-11 (×2): 40 meq via ORAL
  Filled 2018-09-11 (×2): qty 2

## 2018-09-11 NOTE — Progress Notes (Signed)
[facility] NEPHROLOGY PROGRESS NOTE  Assessment/ Plan: Pt is a 73 y.o. yo male with history of CML, gout, CKD stage IV with baseline creatinine level around 3-3.5, hypertension, chronic pancreatitis presented with nausea vomiting and diarrhea, consulted for AKI on CKD.  #Acute on CKD stage IV likely hemodynamically mediated in the setting of dehydration.  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  Patient has nephrotic range proteinuria with recent worsening renal function. -CT scan with no obstruction.  UA with UTI. -The creatinine level was 6.60 on admission now gradually trending down to 4.44 today.  Output marginal with 700 cc recorded. -Agree with discontinuing IV fluid, encourage oral intake. - Monitor BMP and strict ins and out -Added oral sodium bicarbonate  #Hypokalemia due to decreased oral intake: continue KCl.  #Metabolic acidosis due to renal failure: Oral sodium bicarbonate  #UTI: E. coli, on Omnicef.  #Nausea vomiting and diarrhea, chronic: Improved  #Anemia multifactorial: Received a unit of blood transfusion.  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  #Hypertension: Blood pressure acceptable.  Continue current medication.  # CLL; f/u with oncologist.  Subjective: Seen and examined at bedside.  Looks more alert today.  Denies any complaint.  No nausea, vomiting, chest pain or shortness of breath.  Objective Vital signs in last 24 hours: Vitals:   09/10/18 2100 09/11/18 0513 09/11/18 0759 09/11/18 0824  BP: (!) 151/77 (!) 152/68  (!) 141/65  Pulse: 66 66  70  Resp: 18 19  16   Temp: 98.1 F (36.7 C) 98.3 F (36.8 C)  98.4 F (36.9 C)  TempSrc: Oral Oral  Oral  SpO2: 97% 100% 100% 99%  Weight:      Height:       Weight change:   Intake/Output Summary (Last 24 hours) at 09/11/2018 1108 Last data filed at 09/11/2018 8676 Gross per 24 hour  Intake 2590.57 ml  Output 1250 ml  Net 1340.57 ml        Labs: Basic Metabolic Panel: Recent Labs  Lab 09/08/18 1909  09/10/18 0317 09/10/18 1502 09/11/18 0626  NA  --    < > 144 143 142  K  --    < > 2.5* 3.0* 3.3*  CL  --    < > 113* 111 109  CO2  --    < > 22 22 20*  GLUCOSE  --    < > 103* 99 92  BUN  --    < > 69* 64* 57*  CREATININE  --    < > 5.02* 4.75* 4.44*  CALCIUM  --    < > 7.3* 7.5* 7.7*  PHOS 5.5*  --   --   --   --    < > = values in this interval not displayed.   Liver Function Tests: Recent Labs  Lab 09/08/18 2152 09/09/18 0009  AST 37 37  ALT 33 33  ALKPHOS 148* 139*  BILITOT 0.4 0.3  PROT 5.5* 5.8*  ALBUMIN 2.4* 2.4*   Recent Labs  Lab 09/08/18 2152  LIPASE 61*   No results for input(s): AMMONIA in the last 168 hours. CBC: Recent Labs  Lab 09/08/18 2152  09/09/18 0009 09/10/18 0317 09/10/18 0613 09/10/18 1259 09/11/18 0626  WBC 16.1*  --  12.0* 9.1  --   --  10.1  NEUTROABS 11.6*  --   --   --   --   --   --   HGB  5.4*   < > 9.4* 6.7* 6.7* 8.1* 8.0*  HCT 17.6*   < > 30.8* 20.6* 20.7* 24.5* 24.3*  MCV 83.4  --  82.4 77.7*  --   --  79.4*  PLT 294  --  201 134*  --   --  115*   < > = values in this interval not displayed.   Cardiac Enzymes: No results for input(s): CKTOTAL, CKMB, CKMBINDEX, TROPONINI in the last 168 hours. CBG: No results for input(s): GLUCAP in the last 168 hours.  Iron Studies:  Recent Labs    09/10/18 1502  IRON 113  TIBC 136*  FERRITIN 137   Studies/Results: No results found.  Medications: Infusions:   Scheduled Medications: . amLODipine  10 mg Oral Daily  . carvedilol  50 mg Oral BID WC  . [START ON 09/12/2018] cefdinir  300 mg Oral Daily  . cloNIDine  0.2 mg Oral TID  . gabapentin  100 mg Oral Daily  . heparin  5,000 Units Subcutaneous Q8H  . hydrALAZINE  100 mg Oral TID  . isosorbide dinitrate  40 mg Oral TID  . levETIRAcetam  500 mg Oral BID  . megestrol  40 mg Oral Daily  . mometasone-formoterol  2 puff Inhalation BID  . nicotine  21  mg Transdermal Daily  . OLANZapine  2.5 mg Oral QHS  . pantoprazole sodium  40 mg Oral BID  . potassium chloride  40 mEq Oral BID  . simvastatin  40 mg Oral Daily  . sodium bicarbonate  650 mg Oral TID    have reviewed scheduled and prn medications.  Physical Exam: General:NAD, comfortable, looks more alert. Heart:RRR, s1s2 nl Lungs: Clear bilateral, no crackle or wheezing Abdomen:soft, Non-tender, non-distended Extremities:No edema Has condom catheter  [name] 09/11/2018,11:08 AM  LOS: 3 days
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
## 2018-09-12 DIAGNOSIS — L8990 Pressure ulcer of unspecified site, unspecified stage: Secondary | ICD-10-CM

## 2018-09-12 LAB — CBC
HCT: 24.1 % — ABNORMAL LOW (ref 39.0–52.0)
Hemoglobin: 7.9 g/dL — ABNORMAL LOW (ref 13.0–17.0)
MCH: 26.3 pg (ref 26.0–34.0)
MCHC: 32.8 g/dL (ref 30.0–36.0)
MCV: 80.3 fL (ref 80.0–100.0)
Platelets: 118 10*3/uL — ABNORMAL LOW (ref 150–400)
RBC: 3 MIL/uL — ABNORMAL LOW (ref 4.22–5.81)
RDW: 18.8 % — ABNORMAL HIGH (ref 11.5–15.5)
WBC: 11.8 10*3/uL — ABNORMAL HIGH (ref 4.0–10.5)
nRBC: 0 % (ref 0.0–0.2)

## 2018-09-12 LAB — BASIC METABOLIC PANEL
Anion gap: 13 (ref 5–15)
BUN: 54 mg/dL — ABNORMAL HIGH (ref 8–23)
CO2: 21 mmol/L — ABNORMAL LOW (ref 22–32)
Calcium: 7.9 mg/dL — ABNORMAL LOW (ref 8.9–10.3)
Chloride: 109 mmol/L (ref 98–111)
Creatinine, Ser: 4.18 mg/dL — ABNORMAL HIGH (ref 0.61–1.24)
GFR calc Af Amer: 15 mL/min — ABNORMAL LOW (ref >60)
GFR calc non Af Amer: 13 mL/min — ABNORMAL LOW (ref >60)
Glucose, Bld: 120 mg/dL — ABNORMAL HIGH (ref 70–99)
Potassium: 4 mmol/L (ref 3.5–5.1)
Sodium: 143 mmol/L (ref 135–145)

## 2018-09-12 NOTE — Evaluation (Addendum)
Physical Therapy Evaluation Patient Details Name: [name] MRN: [id number] DOB: 1945/09/13 Today's Date: 09/12/2018   History of Present Illness  Pt is a 73 yo male s/p nausea/ vomiting, acute kidney injury and UTI with negative COVID screening. PMHx: HTN, CKD stage 4, seizure d/o, h/o alcohol abuse, chronic pancreatitis, tobacco and and alcohol abuse.\   Clinical Impression  Pt admitted with above diagnosis. Pt currently with functional limitations due to the deficits listed below (see PT Problem List). PTA, patient states he was living at home with his brother who help him walk with crutches and prosthetic. Today, in and out of sleep during visit, total A to come to sitting and maintain stability, unable to support himself in sitting.  Pt will benefit from skilled PT to increase their independence and safety with mobility to allow discharge to the venue listed below.       Follow Up Recommendations SNF;Supervision/Assistance - 24 hour    Equipment Recommendations  (TBD)    Recommendations for Other Services       Precautions / Restrictions Precautions Precautions: Fall Precaution Comments: R BKA Restrictions Weight Bearing Restrictions: No      Mobility  Bed Mobility Overal bed mobility: Needs Assistance Bed Mobility: Rolling;Sidelying to Sit Rolling: Max assist;Total assist Sidelying to sit: Max assist;Total assist;HOB elevated       General bed mobility comments: pt attempting to assist with trunk elevation, but unable to assist due to weakness and lethargy.  Transfers Overall transfer level: Needs assistance               General transfer comment: pt unable  Ambulation/Gait                Stairs            Wheelchair Mobility    Modified Rankin (Stroke Patients Only)       Balance Overall balance assessment: Needs assistance Sitting-balance support: Bilateral upper extremity supported Sitting balance-Leahy Scale: Zero   Postural  control: Posterior lean                                   Pertinent Vitals/Pain Faces Pain Scale: Hurts little more Pain Location: L LLE Pain Descriptors / Indicators: Discomfort    Home Living Family/patient expects to be discharged to:: Private residence Living Arrangements: Alone Available Help at Discharge: Family;Available PRN/intermittently Type of Home: Apartment Home Access: Ramped entrance     Home Layout: One level   Additional Comments: Unable to assess all info as pt drifting in and out of sleep    Prior Function Level of Independence: Needs assistance               Hand Dominance   Dominant Hand: Right    Extremity/Trunk Assessment   Upper Extremity Assessment Upper Extremity Assessment: Generalized weakness    Lower Extremity Assessment Lower Extremity Assessment: Generalized weakness RLE Deficits / Details: R BKA    Cervical / Trunk Assessment Cervical / Trunk Assessment: Other exceptions(posterior lean)  Communication   Communication: HOH  Cognition Arousal/Alertness: Lethargic Behavior During Therapy: Flat affect;WFL for tasks assessed/performed Overall Cognitive Status: History of cognitive impairments - at baseline                                        General Comments  Exercises     Assessment/Plan    PT Assessment Patient needs continued PT services  PT Problem List Decreased strength       PT Treatment Interventions DME instruction;Gait training;Stair training;Functional mobility training;Therapeutic activities;Therapeutic exercise;Balance training    PT Goals (Current goals can be found in the Care Plan section)  Acute Rehab PT Goals Patient Stated Goal: none stated PT Goal Formulation: With patient Potential to Achieve Goals: Fair    Frequency Min 2X/week   Barriers to discharge        Co-evaluation               AM-PAC PT "6 Clicks" Mobility  Outcome Measure Help  needed turning from your back to your side while in a flat bed without using bedrails?: Total Help needed moving from lying on your back to sitting on the side of a flat bed without using bedrails?: Total Help needed moving to and from a bed to a chair (including a wheelchair)?: Total Help needed standing up from a chair using your arms (e.g., wheelchair or bedside chair)?: Total Help needed to walk in hospital room?: Total Help needed climbing 3-5 steps with a railing? : Total 6 Click Score: 6    End of Session   Activity Tolerance: Patient limited by fatigue Patient left: in bed;with call bell/phone within reach;with bed alarm set Nurse Communication: Mobility status PT Visit Diagnosis: Unsteadiness on feet (R26.81)    Time: 7989-2119 PT Time Calculation (min) (ACUTE ONLY): 23 min   Charges:   PT Evaluation $PT Eval Low Complexity: 1 Low PT Treatments $Therapeutic Activity: 8-22 mins        Reinaldo Berber, PT, DPT Acute Rehabilitation Services Pager: [phone] Office: [place] 09/12/2018, 3:47 PM
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
## 2018-09-12 NOTE — Evaluation (Signed)
Occupational Therapy Evaluation Patient Details Name: [name] MRN: [id number] DOB: Oct 04, 1945 Today's Date: 09/12/2018    History of Present Illness Pt is a 73 yo male s/p nausea/ vomiting, acute kidney injury and UTI with negative COVID screening. PMHx: HTN, CKD stage 4, seizure d/o, h/o alcohol abuse, chronic pancreatitis, tobacco and and alcohol abuse.   Clinical Impression   Pt PTA: unable to state clear definition of assistance prior to admission, but reports brother and daughter assists pt as needed, but does not live with him. Pt currently limited by poor activity tolerance, decreased strength and immobility. Pt unable to sit supported at EOB and requires totalA for bed mobility today. Pt attempting to assist as needed, but pt with very poor strength. Pt repositioned back in bed with totalA. Pt sitting upright in bed for breakfast tray. Pt very fatigued and soft spoken. Pt would greatly benefit from continued OT skilled services for ADL, mobility and safety in SNF setting. OT to follow acutely.    Follow Up Recommendations  SNF;Supervision/Assistance - 24 hour    Equipment Recommendations  None recommended by OT    Recommendations for Other Services       Precautions / Restrictions Precautions Precautions: Fall Precaution Comments: R BKA Restrictions Weight Bearing Restrictions: No      Mobility Bed Mobility Overal bed mobility: Needs Assistance Bed Mobility: Rolling;Sidelying to Sit Rolling: Max assist;Total assist Sidelying to sit: Max assist;Total assist;HOB elevated       General bed mobility comments: pt attempting to assist with trunk elevation, but unable to assist due to weakness and lethargy.  Transfers Overall transfer level: Needs assistance               General transfer comment: pt unable    Balance Overall balance assessment: Needs assistance Sitting-balance support: Bilateral upper extremity supported Sitting balance-Leahy Scale: Zero    Postural control: Posterior lean                                 ADL either performed or assessed with clinical judgement   ADL Overall ADL's : Needs assistance/impaired Eating/Feeding: Set up;Bed level   Grooming: Minimal assistance;Bed level   Upper Body Bathing: Moderate assistance;Bed level   Lower Body Bathing: Maximal assistance;Total assistance;Sitting/lateral leans;Bed level   Upper Body Dressing : Moderate assistance;Bed level   Lower Body Dressing: Maximal assistance;Total assistance;Bed level   Toilet Transfer: Anterior/posterior;BSC;Total assistance;+2 for physical assistance;+2 for safety/equipment   Toileting- Clothing Manipulation and Hygiene: Total assistance;Bed level       Functional mobility during ADLs: Total assistance(pt unable to sit upright without fallig backwards) General ADL Comments: pt difficult to arouse and unable to sit pt fully up so that pt could sit up in bed to drink for breakfast. Pt modA for UB ADL and dependent to MaxA for LB ADl     Vision Baseline Vision/History: No visual deficits Vision Assessment?: Vision impaired- to be further tested in functional context     Perception     Praxis      Pertinent Vitals/Pain Pain Assessment: Faces Faces Pain Scale: Hurts little more Pain Location: L LLE Pain Descriptors / Indicators: Discomfort Pain Intervention(s): Limited activity within patient's tolerance     Hand Dominance Right   Extremity/Trunk Assessment Upper Extremity Assessment Upper Extremity Assessment: Generalized weakness   Lower Extremity Assessment Lower Extremity Assessment: Generalized weakness;RLE deficits/detail RLE Deficits / Details: R BKA   Cervical /  Trunk Assessment Cervical / Trunk Assessment: Other exceptions(posterior lean)   Communication Communication Communication: HOH   Cognition Arousal/Alertness: Lethargic Behavior During Therapy: Flat affect;WFL for tasks  assessed/performed Overall Cognitive Status: History of cognitive impairments - at baseline                                     General Comments  Pt reports no dizziness in sitting, but unable to fully sit upright with posterior lean.    Exercises     Shoulder Instructions      Home Living Family/patient expects to be discharged to:: Private residence Living Arrangements: Alone Available Help at Discharge: Family;Available PRN/intermittently Type of Home: Apartment Home Access: Ramped entrance     Home Layout: One level                   Additional Comments: Unable to assess all info as pt drifting in and out of sleep      Prior Functioning/Environment Level of Independence: Needs assistance                 OT Problem List: Decreased strength;Decreased activity tolerance;Impaired balance (sitting and/or standing);Decreased safety awareness;Pain;Decreased coordination      OT Treatment/Interventions: Self-care/ADL training;Therapeutic exercise;Neuromuscular education;Energy conservation;Therapeutic activities;Patient/family education;Balance training    OT Goals(Current goals can be found in the care plan section) Acute Rehab OT Goals Patient Stated Goal: none stated OT Goal Formulation: With patient Time For Goal Achievement: 09/26/18 Potential to Achieve Goals: Fair ADL Goals Pt Will Perform Grooming: with modified independence;sitting Pt Will Perform Upper Body Bathing: with set-up;sitting Pt Will Perform Lower Body Dressing: with set-up;sitting/lateral leans Pt Will Transfer to Toilet: with supervision;squat pivot transfer;bedside commode Pt Will Perform Toileting - Clothing Manipulation and hygiene: with min assist;sitting/lateral leans Additional ADL Goal #1: Pt will sit unsupported EOB x5 mins for ADL task with fair balance  OT Frequency: Min 3X/week   Barriers to D/C:            Co-evaluation              AM-PAC OT "6  Clicks" Daily Activity     Outcome Measure Help from another person eating meals?: A Little Help from another person taking care of personal grooming?: A Lot Help from another person toileting, which includes using toliet, bedpan, or urinal?: Total Help from another person bathing (including washing, rinsing, drying)?: Total Help from another person to put on and taking off regular upper body clothing?: A Lot Help from another person to put on and taking off regular lower body clothing?: Total 6 Click Score: 10   End of Session Nurse Communication: Mobility status  Activity Tolerance: Patient limited by lethargy;Treatment limited secondary to medical complications (Comment) Patient left: in bed;with call bell/phone within reach;with bed alarm set  OT Visit Diagnosis: Unsteadiness on feet (R26.81);Muscle weakness (generalized) (M62.81);Pain Pain - Right/Left: Left Pain - part of body: Leg                Time: 0800-0830 OT Time Calculation (min): 30 min Charges:  OT General Charges $OT Visit: 1 Visit OT Evaluation $OT Eval Moderate Complexity: 1 Mod OT Treatments $Self Care/Home Management : 8-22 mins  Ebony Hail Harold Hedge) Marsa Aris OTR/L Acute Rehabilitation Services Pager: [phone] Office: [place] 09/12/2018, 9:10 AM

## 2018-09-12 NOTE — Progress Notes (Signed)
[name] NEPHROLOGY PROGRESS NOTE  Assessment/ Plan: Pt is a 73 y.o. yo male with history of CML, gout, CKD stage IV with baseline creatinine level around 3-3.5, hypertension, chronic pancreatitis presented with nausea vomiting and diarrhea, consulted for AKI on CKD.  #Acute on CKD stage IV likely hemodynamically mediated in the setting of dehydration.  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  Patient has nephrotic range proteinuria with recent worsening renal function. -CT scan with no obstruction.  UA with UTI. -The creatinine level was 6.60 on admission now gradually trending down to 4.18 today.  Urine output around 1000 cc. - Monitor BMP and strict ins and out -Continue oral sodium bicarbonate  #Hypokalemia due to decreased oral intake: continue KCl.  Potassium level improved.  #Metabolic acidosis due to renal failure: Oral sodium bicarbonate  #UTI: E. coli, on Omnicef.  #Nausea vomiting and diarrhea, chronic: Improved  #Anemia multifactorial: Received a unit of blood transfusion.  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  #Hypertension: Blood pressure acceptable.  Continue current medication.  # CLL; f/u with oncologist.  Kidney function improving and close to his new baseline.  He will need to follow with me at [facility] after discharge from the hospital.  I will sign off at this time.  Please call us with question.  Subjective: Seen and examined at bedside.  Looks more alert than last few days.  Denied nausea vomiting chest pain or shortness of breath.  Objective Vital signs in last 24 hours: Vitals:   09/11/18 2131 09/12/18 0436 09/12/18 0749 09/12/18 0834  BP:  135/65  (!) 137/94  Pulse:  67  69  Resp:  20  18  Temp:  98.4 F (36.9 C)  98.1 F (36.7 C)  TempSrc:  Oral  Oral  SpO2: 100% 97% 98% 99%  Weight:      Height:       Weight change:   Intake/Output Summary (Last 24 hours) at 09/12/2018 1039 Last  data filed at 09/12/2018 0700 Gross per 24 hour  Intake 600 ml  Output 800 ml  Net -200 ml       Labs: Basic Metabolic Panel: Recent Labs  Lab 09/08/18 1909  09/10/18 1502 09/11/18 0626 09/12/18 0411  NA  --    < > 143 142 143  K  --    < > 3.0* 3.3* 4.0  CL  --    < > 111 109 109  CO2  --    < > 22 20* 21*  GLUCOSE  --    < > 99 92 120*  BUN  --    < > 64* 57* 54*  CREATININE  --    < > 4.75* 4.44* 4.18*  CALCIUM  --    < > 7.5* 7.7* 7.9*  PHOS 5.5*  --   --   --   --    < > = values in this interval not displayed.   Liver Function Tests: Recent Labs  Lab 09/08/18 2152 09/09/18 0009  AST 37 37  ALT 33 33  ALKPHOS 148* 139*  BILITOT 0.4 0.3  PROT 5.5* 5.8*  ALBUMIN 2.4* 2.4*   Recent Labs  Lab 09/08/18 2152  LIPASE 61*   No results for input(s): AMMONIA in the last 168 hours. CBC: Recent Labs  Lab 09/08/18 2152  09/09/18 0009 09/10/18 0317  09/10/18 1259 09/11/18 0626 09/12/18 0411  WBC 16.1*  --  12.0* 9.1  --   --  10.1 11.8*  NEUTROABS 11.6*  --   --   --   --   --   --   --   HGB 5.4*   < > 9.4* 6.7*   < > 8.1* 8.0* 7.9*  HCT 17.6*   < > 30.8* 20.6*   < > 24.5* 24.3* 24.1*  MCV 83.4  --  82.4 77.7*  --   --  79.4* 80.3  PLT 294  --  201 134*  --   --  115* 118*   < > = values in this interval not displayed.   Cardiac Enzymes: No results for input(s): CKTOTAL, CKMB, CKMBINDEX, TROPONINI in the last 168 hours. CBG: No results for input(s): GLUCAP in the last 168 hours.  Iron Studies:  Recent Labs    09/10/18 1502  IRON 113  TIBC 136*  FERRITIN 137   Studies/Results: No results found.  Medications: Infusions:   Scheduled Medications: . amLODipine  10 mg Oral Daily  . carvedilol  50 mg Oral BID WC  . cefdinir  300 mg Oral Daily  . cloNIDine  0.2 mg Oral TID  . gabapentin  100 mg Oral Daily  . heparin  5,000 Units Subcutaneous Q8H  . hydrALAZINE  100 mg Oral TID  . isosorbide dinitrate  40 mg Oral TID  . levETIRAcetam  500 mg  Oral BID  . megestrol  40 mg Oral Daily  . mometasone-formoterol  2 puff Inhalation BID  . nicotine  21 mg Transdermal Daily  . OLANZapine  2.5 mg Oral QHS  . pantoprazole sodium  40 mg Oral BID  . simvastatin  40 mg Oral Daily  . sodium bicarbonate  650 mg Oral TID    have reviewed scheduled and prn medications.  Physical Exam: General:NAD, comfortable, looks more alert.Marland Kitchen Heart:RRR, s1s2 nl Lungs: Clear bilateral, no crackle or wheezing Abdomen:soft, Non-tender, non-distended Extremities:No edema Has condom catheter  Dron Prasad Bhandari 09/12/2018,10:39 AM  LOS: 4 days

## 2018-09-13 LAB — CULTURE, BLOOD (ROUTINE X 2)
Culture: NO GROWTH
Culture: NO GROWTH
Special Requests: ADEQUATE

## 2018-09-13 LAB — BASIC METABOLIC PANEL
Anion gap: 10 (ref 5–15)
BUN: 52 mg/dL — ABNORMAL HIGH (ref 8–23)
CO2: 21 mmol/L — ABNORMAL LOW (ref 22–32)
Calcium: 7.9 mg/dL — ABNORMAL LOW (ref 8.9–10.3)
Chloride: 112 mmol/L — ABNORMAL HIGH (ref 98–111)
Creatinine, Ser: 3.9 mg/dL — ABNORMAL HIGH (ref 0.61–1.24)
GFR calc Af Amer: 17 mL/min — ABNORMAL LOW (ref >60)
GFR calc non Af Amer: 14 mL/min — ABNORMAL LOW (ref >60)
Glucose, Bld: 100 mg/dL — ABNORMAL HIGH (ref 70–99)
Potassium: 4.3 mmol/L (ref 3.5–5.1)
Sodium: 143 mmol/L (ref 135–145)

## 2018-09-13 LAB — CBC
HCT: 25.2 % — ABNORMAL LOW (ref 39.0–52.0)
Hemoglobin: 8.1 g/dL — ABNORMAL LOW (ref 13.0–17.0)
MCH: 26.2 pg (ref 26.0–34.0)
MCHC: 32.1 g/dL (ref 30.0–36.0)
MCV: 81.6 fL (ref 80.0–100.0)
Platelets: 111 10*3/uL — ABNORMAL LOW (ref 150–400)
RBC: 3.09 MIL/uL — ABNORMAL LOW (ref 4.22–5.81)
RDW: 18.9 % — ABNORMAL HIGH (ref 11.5–15.5)
WBC: 11.2 10*3/uL — ABNORMAL HIGH (ref 4.0–10.5)
nRBC: 0 % (ref 0.0–0.2)

## 2018-09-13 LAB — MAGNESIUM: Magnesium: 1.4 mg/dL — ABNORMAL LOW (ref 1.7–2.4)

## 2018-09-13 MED ORDER — CEFDINIR 300 MG PO CAPS: 300.0000 mg | ORAL_CAPSULE | Freq: Every day | ORAL | 0 refills | Status: AC
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
## 2018-09-13 NOTE — Care Management Important Message (Signed)
Important Message  Patient Details  Name: [name] MRN: [id number] Date of Birth: [birth year]   Medicare Important Message Given:  Yes    Orbie Pyo 09/13/2018, 1:52 PM

## 2018-09-13 NOTE — Progress Notes (Signed)
DISCHARGE NOTE HOME Fedrick Cefalu to be discharged Home per MD order. Discussed prescriptions and follow up appointments with patient's significant other. Prescriptions given to patient's significant other; medication list explained in detail; verbalized understanding.  Skin clean, dry and intact without evidence of skin break down, no evidence of skin tears noted. IV catheter discontinued intact. Site without signs and symptoms of complications. Dressing and pressure applied. Pt denies pain at the site currently. No complaints noted.  Patient free of lines, drains, and wounds.   An After Visit Summary (AVS) was printed and given to the patient. Patient escorted via wheelchair, and discharged home via private auto.  Orville Govern, RN3

## 2018-09-13 NOTE — TOC Transition Note (Signed)
Transition of Care [facility]) - CM/SW Discharge Note   Patient Details  Name: [name] MRN: [id number] Date of Birth: 1945/08/05  [facility] CM/SW Contact:  Carles Collet, RN Phone Number: 09/13/2018, 11:47 AM   Clinical Narrative:     [name] w patient rep [name] [phone] (due to patient's confusion)  who provides that patient lives alone, has PCS through [place], and she and other family will be able to provide 24 hour supervision. She states that he is active w [name] w PCS and they would like to use [facility] for continued [facility]. Barb Merino accepted referral. [name] to provide transport home. No DME needs. CMA to schedule follow up and notify [name].  No other CM needs identified.      Final next level of care: Home w Home Health Services Barriers to Discharge: No Barriers Identified   Patient Goals and CMS Choice Patient states their goals for this hospitalization and ongoing recovery are:: to return home      Discharge Placement                       Discharge Plan and Services     Post Acute Care Choice: Home Health, Resumption of Svcs/PTA Provider                    HH Arranged: RN, PT, OT, Nurse's Aide, Social Work [employer] Agency: [place] Date [place]: 09/13/18 Time [place]: 1140 Representative spoke with at [place]: [place] ([place]) Interventions     Readmission Risk Interventions Readmission Risk Prevention Plan 09/13/2018 08/08/2018  Transportation Screening Complete Complete  Medication Review Press photographer) Complete Complete  PCP or Specialist appointment within 3-5 days of discharge Complete Complete  HRI or [place] Complete Complete  SW Recovery Care/Counseling Consult Complete Complete  Palliative Care Screening Not Applicable Not [place] Not Applicable Not Applicable  Some recent data might be hidden

## 2018-09-17 ENCOUNTER — Inpatient Hospital Stay: Payer: Medicare Other | Attending: Nurse Practitioner

## 2018-09-17 ENCOUNTER — Other Ambulatory Visit: Payer: Self-pay

## 2018-09-17 ENCOUNTER — Telehealth: Payer: Self-pay | Admitting: Oncology

## 2018-09-17 ENCOUNTER — Telehealth: Payer: Self-pay | Admitting: *Deleted

## 2018-09-17 ENCOUNTER — Inpatient Hospital Stay (HOSPITAL_BASED_OUTPATIENT_CLINIC_OR_DEPARTMENT_OTHER): Payer: Medicare Other | Admitting: Oncology

## 2018-09-17 VITALS — BP 164/85 | HR 58 | Temp 98.0°F | Resp 16 | Ht 68.0 in

## 2018-09-17 DIAGNOSIS — N179 Acute kidney failure, unspecified: Secondary | ICD-10-CM

## 2018-09-17 DIAGNOSIS — C9212 Chronic myeloid leukemia, BCR/ABL-positive, in relapse: Secondary | ICD-10-CM | POA: Diagnosis present

## 2018-09-17 DIAGNOSIS — Z79899 Other long term (current) drug therapy: Secondary | ICD-10-CM | POA: Diagnosis not present

## 2018-09-17 DIAGNOSIS — C9210 Chronic myeloid leukemia, BCR/ABL-positive, not having achieved remission: Secondary | ICD-10-CM

## 2018-09-17 DIAGNOSIS — Z87891 Personal history of nicotine dependence: Secondary | ICD-10-CM | POA: Diagnosis not present

## 2018-09-17 LAB — CMP (CANCER CENTER ONLY)
ALT: 53 U/L — ABNORMAL HIGH (ref 0–44)
AST: 42 U/L — ABNORMAL HIGH (ref 15–41)
Albumin: 2 g/dL — ABNORMAL LOW (ref 3.5–5.0)
Alkaline Phosphatase: 114 U/L (ref 38–126)
Anion gap: 10 (ref 5–15)
BUN: 40 mg/dL — ABNORMAL HIGH (ref 8–23)
CO2: 19 mmol/L — ABNORMAL LOW (ref 22–32)
Calcium: 7.6 mg/dL — ABNORMAL LOW (ref 8.9–10.3)
Chloride: 113 mmol/L — ABNORMAL HIGH (ref 98–111)
Creatinine: 3.06 mg/dL (ref 0.61–1.24)
GFR, Est AFR Am: 22 mL/min — ABNORMAL LOW (ref >60)
GFR, Estimated: 19 mL/min — ABNORMAL LOW (ref >60)
Glucose, Bld: 93 mg/dL (ref 70–99)
Potassium: 4 mmol/L (ref 3.5–5.1)
Sodium: 142 mmol/L (ref 135–145)
Total Bilirubin: <0.2 mg/dL — ABNORMAL LOW (ref 0.3–1.2)
Total Protein: 5.5 g/dL — ABNORMAL LOW (ref 6.5–8.1)

## 2018-09-17 LAB — CBC WITH DIFFERENTIAL (CANCER CENTER ONLY)
Abs Immature Granulocytes: 0.08 10*3/uL — ABNORMAL HIGH (ref 0.00–0.07)
Basophils Absolute: 0 10*3/uL (ref 0.0–0.1)
Basophils Relative: 0 %
Eosinophils Absolute: 0.9 10*3/uL — ABNORMAL HIGH (ref 0.0–0.5)
Eosinophils Relative: 7 %
HCT: 29.1 % — ABNORMAL LOW (ref 39.0–52.0)
Hemoglobin: 9 g/dL — ABNORMAL LOW (ref 13.0–17.0)
Immature Granulocytes: 1 %
Lymphocytes Relative: 11 %
Lymphs Abs: 1.4 10*3/uL (ref 0.7–4.0)
MCH: 25.8 pg — ABNORMAL LOW (ref 26.0–34.0)
MCHC: 30.9 g/dL (ref 30.0–36.0)
MCV: 83.4 fL (ref 80.0–100.0)
Monocytes Absolute: 0.8 10*3/uL (ref 0.1–1.0)
Monocytes Relative: 6 %
Neutro Abs: 9.7 10*3/uL — ABNORMAL HIGH (ref 1.7–7.7)
Neutrophils Relative %: 75 %
Platelet Count: 160 10*3/uL (ref 150–400)
RBC: 3.49 MIL/uL — ABNORMAL LOW (ref 4.22–5.81)
RDW: 19.5 % — ABNORMAL HIGH (ref 11.5–15.5)
WBC Count: 12.8 10*3/uL — ABNORMAL HIGH (ref 4.0–10.5)
nRBC: 0 % (ref 0.0–0.2)

## 2018-09-17 NOTE — Telephone Encounter (Signed)
Scheduled appt per 5/11 los.  Left a voice message of scheduled appointment.

## 2018-09-17 NOTE — Progress Notes (Signed)
[place] OFFICE PROGRESS NOTE   Diagnosis: CML  INTERVAL HISTORY:   Mr. Casasola was admitted with nausea/vomiting and diarrhea.  He had acute renal failure.  He was felt to have viral gastroenteritis.  A stool sample was negative for C. difficile and he had negative COVID testing.  He had severe anemia on hospital admission with a hemoglobin of 5.4.  He was transfused a unit of packed red blood cells. Mr. Ruffins was discharged on 09/13/2018.  He complains of pain at the sacrum, left knee, and left heel.  He has developed an ulcer at the sacrum and a large blister at the left heel.  He requests pain medication. The diarrhea has improved.  Objective:  Vital signs in last 24 hours:  Blood pressure (!) 164/85, pulse (!) 58, temperature 98 F (36.7 C), temperature source Oral, resp. rate 16, height 5\' 8"  (1.727 m), SpO2 100 %.    HEENT: No thrush  GI: Soft, no hepatosplenomegaly, nontender Vascular: No leg edema  Skin: Sacral decubitus ulcer, large blister at the left heel Musculoskeletal: Bony prominence at the medial aspect of the left knee joint   Lab Results:  Lab Results  Component Value Date   WBC 12.8 (H) 09/17/2018   HGB 9.0 (L) 09/17/2018   HCT 29.1 (L) 09/17/2018   MCV 83.4 09/17/2018   PLT 160 09/17/2018   NEUTROABS 9.7 (H) 09/17/2018    CMP  Lab Results  Component Value Date   NA 142 09/17/2018   K 4.0 09/17/2018   CL 113 (H) 09/17/2018   CO2 19 (L) 09/17/2018   GLUCOSE 93 09/17/2018   BUN 40 (H) 09/17/2018   CREATININE 3.06 (HH) 09/17/2018   CALCIUM 7.6 (L) 09/17/2018   PROT 5.5 (L) 09/17/2018   ALBUMIN 2.0 (L) 09/17/2018   AST 42 (H) 09/17/2018   ALT 53 (H) 09/17/2018   ALKPHOS 114 09/17/2018   BILITOT <0.2 (L) 09/17/2018   GFRNONAA 19 (L) 09/17/2018   GFRAA 22 (L) 09/17/2018     Medications: I have reviewed the patient's current medications.   Assessment/Plan: 1. Chronic myelogenous leukemia, diagnosed in January of 2009.  He remains in hematologic remission. He is taking Gleevec at a dose of 200 mg daily. The peripheral blood PCR was markedly increased in May 2013, likely reflecting medical noncompliance. The peripheral blood PCRwas stable on 05/11/2017.  Trial of dasatinib April/May 2019; discontinued due to poor tolerance with diarrhea and nausea  Gleevec resumed May 2019  [place] placed on hold 11/02/2017  Trial of dasatinib 50 mg daily approximately 11/29/2017  Dasatinib placed on hold 12/14/2017  Gleevec 100 mg daily beginning 03/21/2018  Bosutinib 200 mg daily beginning12/23/2019 2. 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 3. Status post C3-C4, C4-C5, anterior cervical diskectomy and fusion with allograft and plating 09/30/2010. 4. 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 5. Depression 6. Diarrhea, ? related to chronic pancreatitis versus Gleevec. He takes Lomotil as needed.  7. Indurated facial skin lesion 11/20/2007 with a history of MRSA skin infection of the submental area in May 2008. The induration resolved with doxycycline. 8. Left knee arthroscopy 05/25/2007. 9. Postoperative left knee effusion/pain, likely related to gout. 10. History of gout.  11. Chronic pancreatitis. 12. Status post right above-the-knee amputation. 13. MRSA infection of the submental area May 2008. 14. History of tobacco, alcohol, and cocaine use. 15. History of coronary artery disease. 16. "Shotty" lymphadenopathy of the  neck, axilla, and left groin in 2009.  17. History of a microcytic anemia.  18. History of a right olecranon bursa lesion, ? gouty tophus. 19. 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 He previously took AndroGel. 20. History of anemia secondary to chronic disease and Gleevec therapy.  21. history of anorexia-potentially related to [place]. He reports Medicaid would not pay for Megace.  22. Chronic left knee and left foot pain.   23. Status post removal of a left knee screw 07/03/2012. 24. History of hypokalemia. Likely related to diarrhea.He is on a potassium supplement. 25. Status post left foot surgery. 26. Pulsatile fullness right neck. Evaluated by vascular surgery status post CT angiogram 03/07/2014 with no carotid artery aneurysm identified. 27. Cough-abnormal lung exam 05/12/2015. He completed a course of Levaquin. Chest xray negative. 28. Hospitalization 09/20/2016 through 10/07/2016 with unresponsiveness/acute metabolic encephalopathy 29. Upper endoscopy 11/21/2017-normal esophagus; erythematous mucosa in the antrum which was biopsied; normal examined duodenum.    Disposition: Mr. Sortor has a long history of chronic myelogenous leukemia.  He has not entered cytogenetic remission, but has not developed evidence of progression to accelerated or blast phase.  He has been unable to tolerate multiple tyrosine kinase inhibitors secondary to diarrhea.  He has been on bosutinib since December 2019 and reports less diarrhea on this medication. He has renal failure, most likely not related to bosutinib or the CML. I discussed the case with Dr. Carolin Sicks.  He will see Mr. Harren to discuss goals of care, dialysis, and his medical regimen.  Bosutinib will remain on hold.  The last peripheral blood PCR showed a decrease in the percent BCR/ABL cells.  The anemia is secondary to CML, chronic disease, and renal failure.  Mr. Hershberger will return for office visit in 3 weeks.  He has a decubitus ulcer at the sacrum and a large blister of the left heel.  Mr. Catlin is homebound.  He has limited ability to ambulate secondary to a leg amputation and his overall clinical status.  We will request a home skin nurse evaluation.  [name], MD  09/17/2018  3:17 PM

## 2018-09-17 NOTE — Telephone Encounter (Signed)
Error

## 2018-09-18 ENCOUNTER — Other Ambulatory Visit: Payer: Self-pay | Admitting: Oncology

## 2018-09-18 ENCOUNTER — Telehealth: Payer: Self-pay | Admitting: *Deleted

## 2018-09-18 MED ORDER — HYDROCODONE-ACETAMINOPHEN 5-325 MG PO TABS: 1.0000 | ORAL_TABLET | Freq: Two times a day (BID) | ORAL | 0 refills | Status: AC | PRN

## 2018-09-18 NOTE — Telephone Encounter (Signed)
Informed his significant other, [name] of visit yesterday and need for home health to follow his ulcer on left heel and sacrum. She reports [facility] already comes in home for this. She provided the name and phone # of his nurse. Also informed her that per MD, needs to stop taking the Bosulif for now. Renal MD will be contacting them soon as well due to decline in his kidney functions. Also informed her of pain med script sent to his pharmacy. [facility] and cancelled the referral and spoke with his nurse w/[facility] and they are following the two wounds he has.

## 2018-09-27 ENCOUNTER — Other Ambulatory Visit: Payer: Self-pay | Admitting: Oncology

## 2018-09-27 DIAGNOSIS — C9210 Chronic myeloid leukemia, BCR/ABL-positive, not having achieved remission: Secondary | ICD-10-CM

## 2018-09-28 ENCOUNTER — Other Ambulatory Visit: Payer: Self-pay | Admitting: Oncology

## 2018-09-28 DIAGNOSIS — C9210 Chronic myeloid leukemia, BCR/ABL-positive, not having achieved remission: Secondary | ICD-10-CM

## 2018-10-08 ENCOUNTER — Inpatient Hospital Stay: Payer: Medicare Other | Admitting: Nurse Practitioner

## 2018-10-08 ENCOUNTER — Inpatient Hospital Stay: Payer: Medicare Other

## 2018-10-09 ENCOUNTER — Inpatient Hospital Stay: Payer: Medicare Other | Attending: Nurse Practitioner

## 2018-10-09 ENCOUNTER — Other Ambulatory Visit: Payer: Self-pay

## 2018-10-09 ENCOUNTER — Telehealth: Payer: Self-pay | Admitting: *Deleted

## 2018-10-09 ENCOUNTER — Inpatient Hospital Stay (HOSPITAL_BASED_OUTPATIENT_CLINIC_OR_DEPARTMENT_OTHER): Payer: Medicare Other | Admitting: Oncology

## 2018-10-09 ENCOUNTER — Other Ambulatory Visit: Payer: Self-pay | Admitting: *Deleted

## 2018-10-09 VITALS — BP 133/72 | HR 69 | Temp 98.6°F | Resp 18 | Ht 68.0 in

## 2018-10-09 DIAGNOSIS — Z79899 Other long term (current) drug therapy: Secondary | ICD-10-CM | POA: Insufficient documentation

## 2018-10-09 DIAGNOSIS — D6489 Other specified anemias: Secondary | ICD-10-CM

## 2018-10-09 DIAGNOSIS — Z87891 Personal history of nicotine dependence: Secondary | ICD-10-CM | POA: Insufficient documentation

## 2018-10-09 DIAGNOSIS — N19 Unspecified kidney failure: Secondary | ICD-10-CM | POA: Insufficient documentation

## 2018-10-09 DIAGNOSIS — C9212 Chronic myeloid leukemia, BCR/ABL-positive, in relapse: Secondary | ICD-10-CM | POA: Diagnosis present

## 2018-10-09 DIAGNOSIS — C9210 Chronic myeloid leukemia, BCR/ABL-positive, not having achieved remission: Secondary | ICD-10-CM

## 2018-10-09 LAB — CBC WITH DIFFERENTIAL (CANCER CENTER ONLY)
Abs Immature Granulocytes: 0.09 10*3/uL — ABNORMAL HIGH (ref 0.00–0.07)
Basophils Absolute: 0 10*3/uL (ref 0.0–0.1)
Basophils Relative: 0 %
Eosinophils Absolute: 0.4 10*3/uL (ref 0.0–0.5)
Eosinophils Relative: 4 %
HCT: 20.7 % — ABNORMAL LOW (ref 39.0–52.0)
Hemoglobin: 6.5 g/dL — CL (ref 13.0–17.0)
Immature Granulocytes: 1 %
Lymphocytes Relative: 6 %
Lymphs Abs: 0.7 10*3/uL (ref 0.7–4.0)
MCH: 25.2 pg — ABNORMAL LOW (ref 26.0–34.0)
MCHC: 31.4 g/dL (ref 30.0–36.0)
MCV: 80.2 fL (ref 80.0–100.0)
Monocytes Absolute: 0.6 10*3/uL (ref 0.1–1.0)
Monocytes Relative: 6 %
Neutro Abs: 8.7 10*3/uL — ABNORMAL HIGH (ref 1.7–7.7)
Neutrophils Relative %: 83 %
Platelet Count: 197 10*3/uL (ref 150–400)
RBC: 2.58 MIL/uL — ABNORMAL LOW (ref 4.22–5.81)
RDW: 18.6 % — ABNORMAL HIGH (ref 11.5–15.5)
WBC Count: 10.6 10*3/uL — ABNORMAL HIGH (ref 4.0–10.5)
nRBC: 0 % (ref 0.0–0.2)

## 2018-10-09 LAB — CMP (CANCER CENTER ONLY)
ALT: 34 U/L (ref 0–44)
AST: 35 U/L (ref 15–41)
Albumin: 1.8 g/dL — ABNORMAL LOW (ref 3.5–5.0)
Alkaline Phosphatase: 83 U/L (ref 38–126)
Anion gap: 10 (ref 5–15)
BUN: 44 mg/dL — ABNORMAL HIGH (ref 8–23)
CO2: 20 mmol/L — ABNORMAL LOW (ref 22–32)
Calcium: 7.6 mg/dL — ABNORMAL LOW (ref 8.9–10.3)
Chloride: 109 mmol/L (ref 98–111)
Creatinine: 3.48 mg/dL (ref 0.61–1.24)
GFR, Est AFR Am: 19 mL/min — ABNORMAL LOW (ref >60)
GFR, Estimated: 16 mL/min — ABNORMAL LOW (ref >60)
Glucose, Bld: 140 mg/dL — ABNORMAL HIGH (ref 70–99)
Potassium: 3.5 mmol/L (ref 3.5–5.1)
Sodium: 139 mmol/L (ref 135–145)
Total Bilirubin: 0.2 mg/dL — ABNORMAL LOW (ref 0.3–1.2)
Total Protein: 5.1 g/dL — ABNORMAL LOW (ref 6.5–8.1)

## 2018-10-09 NOTE — Patient Instructions (Signed)
Anemia  Anemia is a condition in which you do not have enough red blood cells or hemoglobin. Hemoglobin is a substance in red blood cells that carries oxygen. When you do not have enough red blood cells or hemoglobin (are anemic), your body cannot get enough oxygen and your organs may not work properly. As a result, you may feel very tired or have other problems. What are the causes? Common causes of anemia include:  Excessive bleeding. Anemia can be caused by excessive bleeding inside or outside the body, including bleeding from the intestine or from periods in women.  Poor nutrition.  Long-lasting (chronic) kidney, thyroid, and liver disease.  Bone marrow disorders.  Cancer and treatments for cancer.  HIV (human immunodeficiency virus) and AIDS (acquired immunodeficiency syndrome).  Treatments for HIV and AIDS.  Spleen problems.  Blood disorders.  Infections, medicines, and autoimmune disorders that destroy red blood cells. What are the signs or symptoms? Symptoms of this condition include:  Minor weakness.  Dizziness.  Headache.  Feeling heartbeats that are irregular or faster than normal (palpitations).  Shortness of breath, especially with exercise.  Paleness.  Cold sensitivity.  Indigestion.  Nausea.  Difficulty sleeping.  Difficulty concentrating. Symptoms may occur suddenly or develop slowly. If your anemia is mild, you may not have symptoms. How is this diagnosed? This condition is diagnosed based on:  Blood tests.  Your medical history.  A physical exam.  Bone marrow biopsy. Your health care provider may also check your stool (feces) for blood and may do additional testing to look for the cause of your bleeding. You may also have other tests, including:  Imaging tests, such as a CT scan or MRI.  Endoscopy.  Colonoscopy. How is this treated? Treatment for this condition depends on the cause. If you continue to lose a lot of blood, you may  need to be treated at a hospital. Treatment may include:  Taking supplements of iron, vitamin S31, or folic acid.  Taking a hormone medicine (erythropoietin) that can help to stimulate red blood cell growth.  Having a blood transfusion. This may be needed if you lose a lot of blood.  Making changes to your diet.  Having surgery to remove your spleen. Follow these instructions at home:  Take over-the-counter and prescription medicines only as told by your health care provider.  Take supplements only as told by your health care provider.  Follow any diet instructions that you were given.  Keep all follow-up visits as told by your health care provider. This is important. Contact a health care provider if:  You develop new bleeding anywhere in the body. Get help right away if:  You are very weak.  You are short of breath.  You have pain in your abdomen or chest.  You are dizzy or feel faint.  You have trouble concentrating.  You have bloody or black, tarry stools.  You vomit repeatedly or you vomit up blood. Summary  Anemia is a condition in which you do not have enough red blood cells or enough of a substance in your red blood cells that carries oxygen (hemoglobin).  Symptoms may occur suddenly or develop slowly.  If your anemia is mild, you may not have symptoms.  This condition is diagnosed with blood tests as well as a medical history and physical exam. Other tests may be needed.  Treatment for this condition depends on the cause of the anemia. This information is not intended to replace advice given to you by  your health care provider. Make sure you discuss any questions you have with your health care provider. Document Released: 06/02/2004 Document Revised: 05/27/2016 Document Reviewed: 05/27/2016 Elsevier Interactive Patient Education  2019 [employer].

## 2018-10-09 NOTE — Progress Notes (Signed)
Pt to receive 1 unit RBCs on 10/11/2018, pt VU of this plan.  Scheduling message/LOS put in by MD [name], scheduling to call pt to arrange appts on 6/4 for blood.

## 2018-10-09 NOTE — Telephone Encounter (Signed)
Received call report from [place].  "Today's Hgb = 6.5."  Secure Chat message sent with results.   Scheduled provider F/U today. Second call received.  "Creat. = 3.48."

## 2018-10-09 NOTE — Progress Notes (Signed)
[place] OFFICE PROGRESS NOTE   Diagnosis: CML  INTERVAL HISTORY:   [name] returns for scheduled visit.  He remains off of bosutinib.  He complains of malaise.  No diarrhea, bleeding, or dyspnea.  He has pain at the left knee. He has seen Dr. Carolin Sicks for follow-up of renal failure.  Objective:  Vital signs in last 24 hours:  Blood pressure 133/72, pulse 69, temperature 98.6 F (37 C), temperature source Oral, resp. rate 18, height 5\' 8"  (1.727 m), SpO2 100 %.    Resp: Scattered end inspiratory rhonchi, no respiratory distress Cardio: Regular rate and rhythm Vascular: Left leg without edema, no presacral edema Musculoskeletal: No apparent effusion at the left knee, bony prominence at the medial left knee    Lab Results:  Lab Results  Component Value Date   WBC 10.6 (H) 10/09/2018   HGB 6.5 (LL) 10/09/2018   HCT 20.7 (L) 10/09/2018   MCV 80.2 10/09/2018   PLT 197 10/09/2018   NEUTROABS 8.7 (H) 10/09/2018    CMP  Lab Results  Component Value Date   NA 139 10/09/2018   K 3.5 10/09/2018   CL 109 10/09/2018   CO2 20 (L) 10/09/2018   GLUCOSE 140 (H) 10/09/2018   BUN 44 (H) 10/09/2018   CREATININE 3.48 (HH) 10/09/2018   CALCIUM 7.6 (L) 10/09/2018   PROT 5.1 (L) 10/09/2018   ALBUMIN 1.8 (L) 10/09/2018   AST 35 10/09/2018   ALT 34 10/09/2018   ALKPHOS 83 10/09/2018   BILITOT 0.2 (L) 10/09/2018   GFRNONAA 16 (L) 10/09/2018   GFRAA 19 (L) 10/09/2018     Medications: I have reviewed the patient's current medications.   Assessment/Plan: 1. Chronic myelogenous leukemia, diagnosed in January of 2009. He remains in hematologic remission. He is taking Gleevec at a dose of 200 mg daily. The peripheral blood PCR was markedly increased in May 2013, likely reflecting medical noncompliance. The peripheral blood PCRwas stable on 05/11/2017.  Trial of dasatinib April/May 2019; discontinued due to poor tolerance with diarrhea and nausea  Gleevec  resumed May 2019  [place] placed on hold 11/02/2017  Trial of dasatinib 50 mg daily approximately 11/29/2017  Dasatinib placed on hold 12/14/2017  Gleevec 100 mg daily beginning 03/21/2018  Bosutinib 200 mg daily beginning12/23/2019  Bosutinib placed on hold May 2020 2. 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 3. Status post C3-C4, C4-C5, anterior cervical diskectomy and fusion with allograft and plating 09/30/2010. 4. 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 5. Depression 6. Diarrhea, ? related to chronic pancreatitis versus Gleevec. He takes Lomotil as needed.  7. Indurated facial skin lesion 11/20/2007 with a history of MRSA skin infection of the submental area in May 2008. The induration resolved with doxycycline. 8. Left knee arthroscopy 05/25/2007. 9. Postoperative left knee effusion/pain, likely related to gout. 10. History of gout.  11. Chronic pancreatitis. 12. Status post right above-the-knee amputation. 13. MRSA infection of the submental area May 2008. 14. History of tobacco, alcohol, and cocaine use. 15. History of coronary artery disease. 16. "Shotty" lymphadenopathy of the neck, axilla, and left groin in 2009.  17. History of a microcytic anemia.  18. History of a right olecranon bursa lesion, ? gouty tophus. 19. 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 He previously took AndroGel. 20. History of anemia secondary to chronic disease and Gleevec therapy.  21. history of anorexia-potentially related to [place]. He reports Medicaid would not pay for  Megace.  22. Chronic left knee and left foot pain.  23. Status post removal of a left knee screw 07/03/2012. 24. History of hypokalemia. Likely related to diarrhea.He is on a potassium supplement. 25. Status post left foot surgery. 26. Pulsatile fullness right neck. Evaluated by vascular surgery status post CT angiogram 03/07/2014 with no carotid artery  aneurysm identified. 27. Cough-abnormal lung exam 05/12/2015. He completed a course of Levaquin. Chest xray negative. 28. Hospitalization 09/20/2016 through 10/07/2016 with unresponsiveness/acute metabolic encephalopathy 29. Upper endoscopy 11/21/2017-normal esophagus; erythematous mucosa in the antrum which was biopsied; normal examined duodenum. 30. Renal failure 31. Severe anemia   Disposition: [name] has symptomatic anemia.  The anemia is likely secondary to chronic disease, renal failure, and CML.  He will receive a red cell transfusion on 10/11/2018.  I discussed the poor overall prognosis from his multiple medical conditions.  His performance status has declined.  We began a discussion regarding hospice care.  I doubt he will be a candidate for hemodialysis, but I will defer this decision Dr. Carolin Sicks. He remains off of specific treatment for the CML.  He has not developed signs of transitioning to accelerated phase or blast crisis.  [name] will return for an office visit and further discussion on 10/15/2018. [name], MD  10/09/2018  4:45 PM

## 2018-10-10 ENCOUNTER — Telehealth: Payer: Self-pay | Admitting: Oncology

## 2018-10-10 ENCOUNTER — Other Ambulatory Visit: Payer: Self-pay | Admitting: Emergency Medicine

## 2018-10-10 DIAGNOSIS — D649 Anemia, unspecified: Secondary | ICD-10-CM

## 2018-10-10 NOTE — Telephone Encounter (Signed)
Scheduled appt per 6/2 los. °

## 2018-10-10 NOTE — Telephone Encounter (Signed)
Scheduled appt per secure message and LOS - pt is aware of appt tomorrow 6/4 for lab and transfusion

## 2018-10-11 ENCOUNTER — Inpatient Hospital Stay: Payer: Medicare Other

## 2018-10-11 ENCOUNTER — Other Ambulatory Visit: Payer: Self-pay

## 2018-10-11 DIAGNOSIS — C9210 Chronic myeloid leukemia, BCR/ABL-positive, not having achieved remission: Secondary | ICD-10-CM

## 2018-10-11 DIAGNOSIS — D649 Anemia, unspecified: Secondary | ICD-10-CM

## 2018-10-11 DIAGNOSIS — C9212 Chronic myeloid leukemia, BCR/ABL-positive, in relapse: Secondary | ICD-10-CM | POA: Diagnosis not present

## 2018-10-11 LAB — PREPARE RBC (CROSSMATCH)

## 2018-10-11 LAB — SAMPLE TO BLOOD BANK

## 2018-10-11 LAB — ABO/RH: ABO/RH(D): O POS

## 2018-10-11 MED ORDER — ACETAMINOPHEN 325 MG PO TABS
650.0000 mg | ORAL_TABLET | Freq: Once | ORAL | Status: AC
  Administered 2018-10-11: 650 mg via ORAL

## 2018-10-11 MED ORDER — ACETAMINOPHEN 325 MG PO TABS
ORAL_TABLET | ORAL | Status: AC
  Filled 2018-10-11: qty 2

## 2018-10-11 MED ORDER — DIPHENHYDRAMINE HCL 25 MG PO CAPS
25.0000 mg | ORAL_CAPSULE | Freq: Once | ORAL | Status: AC
  Administered 2018-10-11: 25 mg via ORAL

## 2018-10-11 MED ORDER — HEPARIN SOD (PORK) LOCK FLUSH 100 UNIT/ML IV SOLN
500.0000 [IU] | Freq: Every day | INTRAVENOUS | Status: DC | PRN
  Filled 2018-10-11: qty 5

## 2018-10-11 MED ORDER — SODIUM CHLORIDE 0.9% FLUSH
10.0000 mL | INTRAVENOUS | Status: DC | PRN
  Filled 2018-10-11: qty 10

## 2018-10-11 MED ORDER — SODIUM CHLORIDE 0.9% IV SOLUTION
250.0000 mL | Freq: Once | INTRAVENOUS | Status: AC
  Administered 2018-10-11: 250 mL via INTRAVENOUS
  Filled 2018-10-11: qty 250

## 2018-10-11 MED ORDER — DIPHENHYDRAMINE HCL 25 MG PO CAPS
ORAL_CAPSULE | ORAL | Status: AC
  Filled 2018-10-11: qty 1

## 2018-10-11 NOTE — Progress Notes (Signed)
Pt given 1 unit PRBCs today, tolerated well.  Reports having a bit more energy at end of infusion.  Ate and drank without any issue.  VSS.  Pt VU of all education on blood transfusions and of d/c and f/u instructions, signed informed consent, and denied any further questions or concerns at time of d/c.  Escorted via [place] with belongings to [facility] ([place]) car.

## 2018-10-11 NOTE — Patient Instructions (Signed)
Blood Transfusion, Adult, Care After This sheet gives you information about how to care for yourself after your procedure. Your doctor may also give you more specific instructions. If you have problems or questions, contact your doctor. Follow these instructions at home:   Take over-the-counter and prescription medicines only as told by your doctor.  Go back to your normal activities as told by your doctor.  Follow instructions from your doctor about how to take care of the area where an IV tube was put into your vein (insertion site). Make sure you: ? Wash your hands with soap and water before you change your bandage (dressing). If there is no soap and water, use hand sanitizer. ? Change your bandage as told by your doctor.  Check your IV insertion site every day for signs of infection. Check for: ? More redness, swelling, or pain. ? More fluid or blood. ? Warmth. ? Pus or a bad smell. Contact a doctor if:  You have more redness, swelling, or pain around the IV insertion site.  You have more fluid or blood coming from the IV insertion site.  Your IV insertion site feels warm to the touch.  You have pus or a bad smell coming from the IV insertion site.  Your pee (urine) turns pink, red, or brown.  You feel weak after doing your normal activities. Get help right away if:  You have signs of a serious allergic or body defense (immune) system reaction, including: ? Itchiness. ? Hives. ? Trouble breathing. ? Anxiety. ? Pain in your chest or lower back. ? Fever, flushing, and chills. ? Fast pulse. ? Rash. ? Watery poop (diarrhea). ? Throwing up (vomiting). ? Dark pee. ? Serious headache. ? Dizziness. ? Stiff neck. ? Yellow color in your face or the white parts of your eyes (jaundice). Summary  After a blood transfusion, return to your normal activities as told by your doctor.  Every day, check for signs of infection where the IV tube was put into your vein.  Some  signs of infection are warm skin, more redness and pain, more fluid or blood, and pus or a bad smell where the needle went in.  Contact your doctor if you feel weak or have any unusual symptoms. This information is not intended to replace advice given to you by your health care provider. Make sure you discuss any questions you have with your health care provider. Document Released: 05/16/2014 Document Revised: 12/18/2015 Document Reviewed: 12/18/2015 Elsevier Interactive Patient Education  2019 [employer]  

## 2018-10-12 LAB — BPAM RBC
Blood Product Expiration Date: 202007012359
ISSUE DATE / TIME: 202006041259
Unit Type and Rh: 5100

## 2018-10-12 LAB — TYPE AND SCREEN
ABO/RH(D): O POS
Antibody Screen: NEGATIVE
Unit division: 0

## 2018-10-15 ENCOUNTER — Inpatient Hospital Stay: Payer: Medicare Other

## 2018-10-15 ENCOUNTER — Other Ambulatory Visit: Payer: Self-pay

## 2018-10-15 ENCOUNTER — Encounter: Payer: Self-pay | Admitting: Nurse Practitioner

## 2018-10-15 ENCOUNTER — Emergency Department (HOSPITAL_COMMUNITY)
Admission: EM | Admit: 2018-10-15 | Discharge: 2018-10-15 | Disposition: A | Discharge status: Home / Self Care | Payer: Medicare Other | Attending: Emergency Medicine | Admitting: Emergency Medicine

## 2018-10-15 ENCOUNTER — Inpatient Hospital Stay (HOSPITAL_BASED_OUTPATIENT_CLINIC_OR_DEPARTMENT_OTHER): Payer: Medicare Other | Admitting: Nurse Practitioner

## 2018-10-15 ENCOUNTER — Encounter (HOSPITAL_COMMUNITY): Payer: Self-pay | Admitting: Emergency Medicine

## 2018-10-15 VITALS — BP 111/82 | HR 61 | Temp 99.2°F | Resp 18 | Ht 68.0 in

## 2018-10-15 DIAGNOSIS — N19 Unspecified kidney failure: Secondary | ICD-10-CM | POA: Diagnosis not present

## 2018-10-15 DIAGNOSIS — K922 Gastrointestinal hemorrhage, unspecified: Secondary | ICD-10-CM | POA: Diagnosis present

## 2018-10-15 DIAGNOSIS — N185 Chronic kidney disease, stage 5: Secondary | ICD-10-CM | POA: Diagnosis not present

## 2018-10-15 DIAGNOSIS — I639 Cerebral infarction, unspecified: Secondary | ICD-10-CM

## 2018-10-15 DIAGNOSIS — C9210 Chronic myeloid leukemia, BCR/ABL-positive, not having achieved remission: Secondary | ICD-10-CM

## 2018-10-15 DIAGNOSIS — I120 Hypertensive chronic kidney disease with stage 5 chronic kidney disease or end stage renal disease: Secondary | ICD-10-CM | POA: Insufficient documentation

## 2018-10-15 DIAGNOSIS — Z856 Personal history of leukemia: Secondary | ICD-10-CM | POA: Insufficient documentation

## 2018-10-15 DIAGNOSIS — Z96652 Presence of left artificial knee joint: Secondary | ICD-10-CM | POA: Insufficient documentation

## 2018-10-15 DIAGNOSIS — C9212 Chronic myeloid leukemia, BCR/ABL-positive, in relapse: Secondary | ICD-10-CM

## 2018-10-15 DIAGNOSIS — Z89611 Acquired absence of right leg above knee: Secondary | ICD-10-CM | POA: Insufficient documentation

## 2018-10-15 DIAGNOSIS — J449 Chronic obstructive pulmonary disease, unspecified: Secondary | ICD-10-CM | POA: Insufficient documentation

## 2018-10-15 DIAGNOSIS — Z87891 Personal history of nicotine dependence: Secondary | ICD-10-CM

## 2018-10-15 DIAGNOSIS — D6489 Other specified anemias: Secondary | ICD-10-CM

## 2018-10-15 DIAGNOSIS — Z79899 Other long term (current) drug therapy: Secondary | ICD-10-CM | POA: Diagnosis not present

## 2018-10-15 DIAGNOSIS — L89151 Pressure ulcer of sacral region, stage 1: Secondary | ICD-10-CM | POA: Diagnosis not present

## 2018-10-15 LAB — CBC WITH DIFFERENTIAL (CANCER CENTER ONLY)
Abs Immature Granulocytes: 0.14 10*3/uL — ABNORMAL HIGH (ref 0.00–0.07)
Basophils Absolute: 0 10*3/uL (ref 0.0–0.1)
Basophils Relative: 0 %
Eosinophils Absolute: 0.1 10*3/uL (ref 0.0–0.5)
Eosinophils Relative: 1 %
HCT: 23.2 % — ABNORMAL LOW (ref 39.0–52.0)
Hemoglobin: 7.3 g/dL — ABNORMAL LOW (ref 13.0–17.0)
Immature Granulocytes: 1 %
Lymphocytes Relative: 5 %
Lymphs Abs: 0.7 10*3/uL (ref 0.7–4.0)
MCH: 26 pg (ref 26.0–34.0)
MCHC: 31.5 g/dL (ref 30.0–36.0)
MCV: 82.6 fL (ref 80.0–100.0)
Monocytes Absolute: 0.4 10*3/uL (ref 0.1–1.0)
Monocytes Relative: 3 %
Neutro Abs: 11.8 10*3/uL — ABNORMAL HIGH (ref 1.7–7.7)
Neutrophils Relative %: 90 %
Platelet Count: 193 10*3/uL (ref 150–400)
RBC: 2.81 MIL/uL — ABNORMAL LOW (ref 4.22–5.81)
RDW: 18.6 % — ABNORMAL HIGH (ref 11.5–15.5)
WBC Count: 13.3 10*3/uL — ABNORMAL HIGH (ref 4.0–10.5)
nRBC: 0 % (ref 0.0–0.2)

## 2018-10-15 LAB — CMP (CANCER CENTER ONLY)
ALT: 98 U/L — ABNORMAL HIGH (ref 0–44)
AST: 65 U/L — ABNORMAL HIGH (ref 15–41)
Albumin: 2 g/dL — ABNORMAL LOW (ref 3.5–5.0)
Alkaline Phosphatase: 79 U/L (ref 38–126)
Anion gap: 10 (ref 5–15)
BUN: 35 mg/dL — ABNORMAL HIGH (ref 8–23)
CO2: 18 mmol/L — ABNORMAL LOW (ref 22–32)
Calcium: 8.1 mg/dL — ABNORMAL LOW (ref 8.9–10.3)
Chloride: 113 mmol/L — ABNORMAL HIGH (ref 98–111)
Creatinine: 2.67 mg/dL — ABNORMAL HIGH (ref 0.61–1.24)
GFR, Est AFR Am: 26 mL/min — ABNORMAL LOW (ref >60)
GFR, Estimated: 23 mL/min — ABNORMAL LOW (ref >60)
Glucose, Bld: 115 mg/dL — ABNORMAL HIGH (ref 70–99)
Potassium: 4.5 mmol/L (ref 3.5–5.1)
Sodium: 141 mmol/L (ref 135–145)
Total Bilirubin: 0.2 mg/dL — ABNORMAL LOW (ref 0.3–1.2)
Total Protein: 5.5 g/dL — ABNORMAL LOW (ref 6.5–8.1)

## 2018-10-15 LAB — BASIC METABOLIC PANEL
Anion gap: 9 (ref 5–15)
BUN: 36 mg/dL — ABNORMAL HIGH (ref 8–23)
CO2: 18 mmol/L — ABNORMAL LOW (ref 22–32)
Calcium: 8.7 mg/dL — ABNORMAL LOW (ref 8.9–10.3)
Chloride: 115 mmol/L — ABNORMAL HIGH (ref 98–111)
Creatinine, Ser: 2.6 mg/dL — ABNORMAL HIGH (ref 0.61–1.24)
GFR calc Af Amer: 27 mL/min — ABNORMAL LOW (ref >60)
GFR calc non Af Amer: 23 mL/min — ABNORMAL LOW (ref >60)
Glucose, Bld: 93 mg/dL (ref 70–99)
Potassium: 4.2 mmol/L (ref 3.5–5.1)
Sodium: 142 mmol/L (ref 135–145)

## 2018-10-15 LAB — CBC WITH DIFFERENTIAL/PLATELET
Abs Immature Granulocytes: 0.27 10*3/uL — ABNORMAL HIGH (ref 0.00–0.07)
Basophils Absolute: 0.1 10*3/uL (ref 0.0–0.1)
Basophils Relative: 0 %
Eosinophils Absolute: 0.2 10*3/uL (ref 0.0–0.5)
Eosinophils Relative: 2 %
HCT: 26.9 % — ABNORMAL LOW (ref 39.0–52.0)
Hemoglobin: 8.4 g/dL — ABNORMAL LOW (ref 13.0–17.0)
Immature Granulocytes: 2 %
Lymphocytes Relative: 4 %
Lymphs Abs: 0.7 10*3/uL (ref 0.7–4.0)
MCH: 26.2 pg (ref 26.0–34.0)
MCHC: 31.2 g/dL (ref 30.0–36.0)
MCV: 83.8 fL (ref 80.0–100.0)
Monocytes Absolute: 0.9 10*3/uL (ref 0.1–1.0)
Monocytes Relative: 6 %
Neutro Abs: 13.9 10*3/uL — ABNORMAL HIGH (ref 1.7–7.7)
Neutrophils Relative %: 86 %
Platelets: 219 10*3/uL (ref 150–400)
RBC: 3.21 MIL/uL — ABNORMAL LOW (ref 4.22–5.81)
RDW: 18.5 % — ABNORMAL HIGH (ref 11.5–15.5)
WBC: 16 10*3/uL — ABNORMAL HIGH (ref 4.0–10.5)
nRBC: 0 % (ref 0.0–0.2)

## 2018-10-15 LAB — TYPE AND SCREEN
ABO/RH(D): O POS
Antibody Screen: NEGATIVE

## 2018-10-15 LAB — SAMPLE TO BLOOD BANK

## 2018-10-15 NOTE — Progress Notes (Addendum)
[place] OFFICE PROGRESS NOTE   Diagnosis: CML   INTERVAL HISTORY:   [name] returns as scheduled.  He was transfused a unit of blood on 10/11/2018.  He reports feeling better after receiving the blood.  He has pain at the sacrum.  He relates the pain to a "sore".  He denies abdominal pain.  No diarrhea.  No fever.  He has stable dyspnea on exertion.  He was seen in the emergency department this morning for evaluation of rectal bleeding.  Stool noted to be brown with no gross blood on rectal examination.  The blood was suspected to have come from a small wound at the perineum.  He was discharged home.  Objective:  Vital signs in last 24 hours:  Blood pressure 111/82, pulse 61, temperature 99.2 F (37.3 C), temperature source Oral, resp. rate 18, height 5\' 8"  (1.727 m), SpO2 99 %.    GI: Abdomen soft and nontender.  No splenomegaly. Vascular: No left leg edema.  Skin: Superficial ulceration at the sacrum.   Lab Results:  Lab Results  Component Value Date   WBC 13.3 (H) 10/15/2018   HGB 7.3 (L) 10/15/2018   HCT 23.2 (L) 10/15/2018   MCV 82.6 10/15/2018   PLT 193 10/15/2018   NEUTROABS 11.8 (H) 10/15/2018    Imaging:  No results found.  Medications: I have reviewed the patient's current medications.  Assessment/Plan: 1. Chronic myelogenous leukemia, diagnosed in January of 2009. He remains in hematologic remission. He is taking Gleevec at a dose of 200 mg daily. The peripheral blood PCR was markedly increased in May 2013, likely reflecting medical noncompliance. The peripheral blood PCRwas stable on 05/11/2017.  Trial of dasatinib April/May 2019; discontinued due to poor tolerance with diarrhea and nausea  Gleevec resumed May 2019  [place] placed on hold 11/02/2017  Trial of dasatinib 50 mg daily approximately 11/29/2017  Dasatinib placed on hold 12/14/2017  Gleevec 100 mg daily beginning 03/21/2018  Bosutinib 200 mg daily beginning12/23/2019   Bosutinib placed on hold May 2020 2. 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 3. Status post C3-C4, C4-C5, anterior cervical diskectomy and fusion with allograft and plating 09/30/2010. 4. 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 5. Depression 6. Diarrhea, ? related to chronic pancreatitis versus Gleevec. He takes Lomotil as needed.  7. Indurated facial skin lesion 11/20/2007 with a history of MRSA skin infection of the submental area in May 2008. The induration resolved with doxycycline. 8. Left knee arthroscopy 05/25/2007. 9. Postoperative left knee effusion/pain, likely related to gout. 10. History of gout.  11. Chronic pancreatitis. 12. Status post right above-the-knee amputation. 13. MRSA infection of the submental area May 2008. 14. History of tobacco, alcohol, and cocaine use. 15. History of coronary artery disease. 16. "Shotty" lymphadenopathy of the neck, axilla, and left groin in 2009.  17. History of a microcytic anemia.  18. History of a right olecranon bursa lesion, ? gouty tophus. 19. 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 He previously took AndroGel. 20. History of anemia secondary to chronic disease and Gleevec therapy.  21. history of anorexia-potentially related to [place]. He reports Medicaid would not pay for Megace.  22. Chronic left knee and left foot pain.  23. Status post removal of a left knee screw 07/03/2012. 24. History of hypokalemia. Likely related to diarrhea.He is on a potassium supplement. 25. Status post left foot surgery. 26. Pulsatile fullness right neck. Evaluated by vascular surgery  status post CT angiogram 03/07/2014 with no carotid artery aneurysm identified. 27. Cough-abnormal lung exam 05/12/2015. He completed a course of Levaquin. Chest xray negative. 28. Hospitalization 09/20/2016 through 10/07/2016 with unresponsiveness/acute metabolic encephalopathy 29. Upper  endoscopy 11/21/2017-normal esophagus; erythematous mucosa in the antrum which was biopsied; normal examined duodenum. 30. Renal failure 31. Severe anemia    Disposition: [name] has CML.  He remains off of specific treatment.  He has multiple morbidities.  He continues to have a poor performance status.  Dr. [name] discussed a referral to the [facility] hospice program.  [name] is in agreement.  We reviewed CODE STATUS/end-of-life issues.  He will be placed on NO CODE BLUE status.  We reviewed the CBC from today.  He has persistent anemia.  He appears asymptomatic.  He will return for a follow-up CBC in 2 weeks.  He will return for a follow-up visit in 4 weeks.  He will contact the office in the interim with any problems.  Patient seen with Dr. [name].    [name] ANP/GNP-BC   10/15/2018  2:26 PM This was a shared visit with [name].  [name] was interviewed and examined.  His performance status continues to be poor.  He has severe anemia.  He has a decubitus ulcer.  I discussed treatment options of [name].  I recommend hospice care.  He is in agreement.  We contacted his significant other, [name], and she is also in agreement.  I reviewed CPR and ACLS issues with [name].  He will be placed on a no CODE BLUE status.  I recommend the hospice team contact Dr. Jeanie Cooks to consolidate [name] medical regimen.  The severe anemia is likely secondary to chronic disease, bleeding, renal insufficiency, and CML.  He has not shown evidence of conversion to accelerated phase or blast crisis.  Most recent peripheral blood PCR revealed a decrease in the BCR/ABL percentage.  Julieanne Manson, MD

## 2018-10-15 NOTE — ED Notes (Signed)
Awaiting for ride that verbalizes "will be there in 30 minutes."

## 2018-10-15 NOTE — ED Notes (Signed)
Bed: WA20 Expected date:  Expected time:  Means of arrival:  Comments: EMS GI bleed- received transfusion last week
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
## 2018-10-15 NOTE — ED Triage Notes (Signed)
Per EMS pt caretaker noted bright red blood; concern for GI bleed.

## 2018-10-16 ENCOUNTER — Telehealth: Payer: Self-pay | Admitting: *Deleted

## 2018-10-16 ENCOUNTER — Telehealth: Payer: Self-pay | Admitting: Nurse Practitioner

## 2018-10-16 NOTE — Telephone Encounter (Signed)
Called to inform Dr. [name] that patient has declined hospice care at this time due to having home health and PT/OT helping in the home and he feels he is getting benefit from this. They are due to finish on 10/25/18. They will reach out to him again on 10/26/18. She understands that Dr. [name] will not be the primary MD for his hospice care.

## 2018-10-16 NOTE — Telephone Encounter (Signed)
Scheduled appt per 6/8 los. Left a voice message of scheduled appt date and time.

## 2018-10-17 LAB — BCR/ABL

## 2018-10-19 ENCOUNTER — Other Ambulatory Visit: Payer: Self-pay

## 2018-10-19 ENCOUNTER — Other Ambulatory Visit: Payer: Medicare Other | Admitting: Adult Health Nurse Practitioner

## 2018-10-19 DIAGNOSIS — Z515 Encounter for palliative care: Secondary | ICD-10-CM
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
## 2018-10-26 ENCOUNTER — Other Ambulatory Visit (INDEPENDENT_AMBULATORY_CARE_PROVIDER_SITE_OTHER): Payer: Self-pay | Admitting: Orthopaedic Surgery

## 2018-10-26 NOTE — Telephone Encounter (Signed)
Can you advise? Or get from PCP?

## 2018-10-29 ENCOUNTER — Inpatient Hospital Stay: Payer: Medicare Other

## 2018-10-30 ENCOUNTER — Other Ambulatory Visit: Payer: Self-pay

## 2018-10-30 ENCOUNTER — Other Ambulatory Visit: Payer: Medicare Other | Admitting: Adult Health Nurse Practitioner

## 2018-10-30 DIAGNOSIS — Z515 Encounter for palliative care: Secondary | ICD-10-CM
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
## 2018-11-02 ENCOUNTER — Inpatient Hospital Stay (HOSPITAL_COMMUNITY)
Admission: EM | Admit: 2018-11-02 | Discharge: 2018-11-04 | DRG: 811 | Disposition: A | Discharge status: Hospice | Payer: Medicare Other | Attending: Internal Medicine | Admitting: Internal Medicine

## 2018-11-02 DIAGNOSIS — D500 Iron deficiency anemia secondary to blood loss (chronic): Secondary | ICD-10-CM | POA: Diagnosis present

## 2018-11-02 DIAGNOSIS — Z7982 Long term (current) use of aspirin: Secondary | ICD-10-CM

## 2018-11-02 DIAGNOSIS — I361 Nonrheumatic tricuspid (valve) insufficiency: Secondary | ICD-10-CM | POA: Diagnosis not present

## 2018-11-02 DIAGNOSIS — Z66 Do not resuscitate: Secondary | ICD-10-CM | POA: Diagnosis not present

## 2018-11-02 DIAGNOSIS — I340 Nonrheumatic mitral (valve) insufficiency: Secondary | ICD-10-CM | POA: Diagnosis not present

## 2018-11-02 DIAGNOSIS — D649 Anemia, unspecified: Secondary | ICD-10-CM | POA: Diagnosis not present

## 2018-11-02 DIAGNOSIS — N184 Chronic kidney disease, stage 4 (severe): Secondary | ICD-10-CM | POA: Diagnosis present

## 2018-11-02 DIAGNOSIS — G629 Polyneuropathy, unspecified: Secondary | ICD-10-CM | POA: Diagnosis present

## 2018-11-02 DIAGNOSIS — I129 Hypertensive chronic kidney disease with stage 1 through stage 4 chronic kidney disease, or unspecified chronic kidney disease: Secondary | ICD-10-CM | POA: Diagnosis present

## 2018-11-02 DIAGNOSIS — K5791 Diverticulosis of intestine, part unspecified, without perforation or abscess with bleeding: Secondary | ICD-10-CM | POA: Diagnosis present

## 2018-11-02 DIAGNOSIS — Z1159 Encounter for screening for other viral diseases: Secondary | ICD-10-CM | POA: Diagnosis not present

## 2018-11-02 DIAGNOSIS — Z79899 Other long term (current) drug therapy: Secondary | ICD-10-CM

## 2018-11-02 DIAGNOSIS — R195 Other fecal abnormalities: Secondary | ICD-10-CM

## 2018-11-02 DIAGNOSIS — R55 Syncope and collapse: Secondary | ICD-10-CM | POA: Diagnosis present

## 2018-11-02 DIAGNOSIS — Z87891 Personal history of nicotine dependence: Secondary | ICD-10-CM | POA: Diagnosis not present

## 2018-11-02 DIAGNOSIS — C9210 Chronic myeloid leukemia, BCR/ABL-positive, not having achieved remission: Secondary | ICD-10-CM | POA: Diagnosis present

## 2018-11-02 DIAGNOSIS — J449 Chronic obstructive pulmonary disease, unspecified: Secondary | ICD-10-CM | POA: Diagnosis present

## 2018-11-02 DIAGNOSIS — R197 Diarrhea, unspecified: Secondary | ICD-10-CM | POA: Diagnosis present

## 2018-11-02 DIAGNOSIS — G40909 Epilepsy, unspecified, not intractable, without status epilepticus: Secondary | ICD-10-CM | POA: Diagnosis present

## 2018-11-02 DIAGNOSIS — I10 Essential (primary) hypertension: Secondary | ICD-10-CM | POA: Diagnosis present

## 2018-11-02 DIAGNOSIS — Z515 Encounter for palliative care: Secondary | ICD-10-CM | POA: Diagnosis present

## 2018-11-02 DIAGNOSIS — K649 Unspecified hemorrhoids: Secondary | ICD-10-CM | POA: Diagnosis present

## 2018-11-02 DIAGNOSIS — M109 Gout, unspecified: Secondary | ICD-10-CM | POA: Diagnosis present

## 2018-11-02 DIAGNOSIS — I720 Aneurysm of carotid artery: Secondary | ICD-10-CM | POA: Diagnosis present

## 2018-11-02 DIAGNOSIS — I252 Old myocardial infarction: Secondary | ICD-10-CM | POA: Diagnosis not present

## 2018-11-02 DIAGNOSIS — E872 Acidosis: Secondary | ICD-10-CM | POA: Diagnosis present

## 2018-11-02 DIAGNOSIS — F329 Major depressive disorder, single episode, unspecified: Secondary | ICD-10-CM | POA: Diagnosis present

## 2018-11-02 DIAGNOSIS — R569 Unspecified convulsions: Secondary | ICD-10-CM

## 2018-11-02 DIAGNOSIS — K861 Other chronic pancreatitis: Secondary | ICD-10-CM | POA: Diagnosis present

## 2018-11-02 DIAGNOSIS — Z8249 Family history of ischemic heart disease and other diseases of the circulatory system: Secondary | ICD-10-CM

## 2018-11-02 LAB — CBC WITH DIFFERENTIAL/PLATELET
Abs Immature Granulocytes: 0.21 10*3/uL — ABNORMAL HIGH (ref 0.00–0.07)
Basophils Absolute: 0 10*3/uL (ref 0.0–0.1)
Basophils Relative: 0 %
Eosinophils Absolute: 0.4 10*3/uL (ref 0.0–0.5)
Eosinophils Relative: 3 %
HCT: 21.3 % — ABNORMAL LOW (ref 39.0–52.0)
Hemoglobin: 6.5 g/dL — CL (ref 13.0–17.0)
Immature Granulocytes: 2 %
Lymphocytes Relative: 6 %
Lymphs Abs: 0.7 10*3/uL (ref 0.7–4.0)
MCH: 25.3 pg — ABNORMAL LOW (ref 26.0–34.0)
MCHC: 30.5 g/dL (ref 30.0–36.0)
MCV: 82.9 fL (ref 80.0–100.0)
Monocytes Absolute: 0.7 10*3/uL (ref 0.1–1.0)
Monocytes Relative: 6 %
Neutro Abs: 10.3 10*3/uL — ABNORMAL HIGH (ref 1.7–7.7)
Neutrophils Relative %: 83 %
Platelets: 426 10*3/uL — ABNORMAL HIGH (ref 150–400)
RBC: 2.57 MIL/uL — ABNORMAL LOW (ref 4.22–5.81)
RDW: 19.1 % — ABNORMAL HIGH (ref 11.5–15.5)
WBC: 12.4 10*3/uL — ABNORMAL HIGH (ref 4.0–10.5)
nRBC: 0 % (ref 0.0–0.2)

## 2018-11-02 LAB — URINALYSIS, ROUTINE W REFLEX MICROSCOPIC
Bacteria, UA: NONE SEEN
Bilirubin Urine: NEGATIVE
Glucose, UA: NEGATIVE mg/dL
Hgb urine dipstick: NEGATIVE
Ketones, ur: NEGATIVE mg/dL
Leukocytes,Ua: NEGATIVE
Nitrite: NEGATIVE
Protein, ur: 100 mg/dL — AB
Specific Gravity, Urine: 1.014 (ref 1.005–1.030)
pH: 5 (ref 5.0–8.0)

## 2018-11-02 LAB — BASIC METABOLIC PANEL
Anion gap: 13 (ref 5–15)
BUN: 43 mg/dL — ABNORMAL HIGH (ref 8–23)
CO2: 17 mmol/L — ABNORMAL LOW (ref 22–32)
Calcium: 8.2 mg/dL — ABNORMAL LOW (ref 8.9–10.3)
Chloride: 109 mmol/L (ref 98–111)
Creatinine, Ser: 2.63 mg/dL — ABNORMAL HIGH (ref 0.61–1.24)
GFR calc Af Amer: 27 mL/min — ABNORMAL LOW (ref >60)
GFR calc non Af Amer: 23 mL/min — ABNORMAL LOW (ref >60)
Glucose, Bld: 105 mg/dL — ABNORMAL HIGH (ref 70–99)
Potassium: 3.5 mmol/L (ref 3.5–5.1)
Sodium: 139 mmol/L (ref 135–145)

## 2018-11-02 LAB — POC OCCULT BLOOD, ED: Fecal Occult Bld: POSITIVE — AB

## 2018-11-02 LAB — MAGNESIUM: Magnesium: 1.8 mg/dL (ref 1.7–2.4)

## 2018-11-02 LAB — PHOSPHORUS: Phosphorus: 4.6 mg/dL (ref 2.5–4.6)

## 2018-11-02 LAB — CBG MONITORING, ED: Glucose-Capillary: 116 mg/dL — ABNORMAL HIGH (ref 70–99)

## 2018-11-02 MED ORDER — SODIUM CHLORIDE 0.9 % IV SOLN
10.0000 mL/h | Freq: Once | INTRAVENOUS | Status: AC
  Administered 2018-11-03: 10 mL/h via INTRAVENOUS

## 2018-11-02 MED ORDER — LEVETIRACETAM 500 MG PO TABS
500.0000 mg | ORAL_TABLET | Freq: Once | ORAL | Status: AC
  Administered 2018-11-03: 500 mg via ORAL
  Filled 2018-11-02: qty 1

## 2018-11-02 MED ORDER — LORAZEPAM 2 MG/ML IJ SOLN
1.0000 mg | Freq: Once | INTRAMUSCULAR | Status: AC
  Administered 2018-11-03: 1 mg via INTRAVENOUS
  Filled 2018-11-02: qty 1

## 2018-11-02 MED ORDER — SODIUM CHLORIDE 0.9 % IV BOLUS
500.0000 mL | Freq: Once | INTRAVENOUS | Status: AC
  Administered 2018-11-02: 500 mL via INTRAVENOUS

## 2018-11-02 MED ORDER — HYDROCORTISONE ACETATE 25 MG RE SUPP
25.0000 mg | Freq: Once | RECTAL | Status: AC
  Administered 2018-11-02: 25 mg via RECTAL
  Filled 2018-11-02: qty 1

## 2018-11-02 NOTE — ED Notes (Signed)
Hgb 6.5, PA and MD made aware.

## 2018-11-02 NOTE — ED Notes (Signed)
Attempted 2 IVs. Both "blew". Placed order for IV team.

## 2018-11-02 NOTE — ED Notes (Signed)
Extra blue red and LT green tube sent to lab

## 2018-11-02 NOTE — ED Triage Notes (Signed)
Arrived by [facility] with c/o seizures. 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 Family reports 2 seizures today with second seizure lasting longer than normal; patient was postictal for approximately 20 minutes after second seizure. Family also reports diarrhea over past 3-4 days. Patient now A&O X4. Did not fall. CBG:131

## 2018-11-02 NOTE — ED Notes (Signed)
Bed: WA21 Expected date:  Expected time:  Means of arrival:  Comments: Ems-sz
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
## 2018-11-03 ENCOUNTER — Inpatient Hospital Stay (HOSPITAL_COMMUNITY): Payer: Medicare Other

## 2018-11-03 ENCOUNTER — Other Ambulatory Visit: Payer: Self-pay

## 2018-11-03 DIAGNOSIS — I340 Nonrheumatic mitral (valve) insufficiency: Secondary | ICD-10-CM

## 2018-11-03 DIAGNOSIS — I361 Nonrheumatic tricuspid (valve) insufficiency: Secondary | ICD-10-CM

## 2018-11-03 DIAGNOSIS — D649 Anemia, unspecified: Secondary | ICD-10-CM

## 2018-11-03 LAB — ECHOCARDIOGRAM LIMITED
Height: 69 in
Weight: 1816.59 oz

## 2018-11-03 LAB — BASIC METABOLIC PANEL
Anion gap: 10 (ref 5–15)
BUN: 39 mg/dL — ABNORMAL HIGH (ref 8–23)
CO2: 18 mmol/L — ABNORMAL LOW (ref 22–32)
Calcium: 8.1 mg/dL — ABNORMAL LOW (ref 8.9–10.3)
Chloride: 113 mmol/L — ABNORMAL HIGH (ref 98–111)
Creatinine, Ser: 2.4 mg/dL — ABNORMAL HIGH (ref 0.61–1.24)
GFR calc Af Amer: 30 mL/min — ABNORMAL LOW (ref >60)
GFR calc non Af Amer: 26 mL/min — ABNORMAL LOW (ref >60)
Glucose, Bld: 120 mg/dL — ABNORMAL HIGH (ref 70–99)
Potassium: 2.8 mmol/L — ABNORMAL LOW (ref 3.5–5.1)
Sodium: 141 mmol/L (ref 135–145)

## 2018-11-03 LAB — C DIFFICILE QUICK SCREEN W PCR REFLEX
C Diff antigen: NEGATIVE
C Diff interpretation: NOT DETECTED
C Diff toxin: NEGATIVE

## 2018-11-03 LAB — IRON AND TIBC
Iron: 50 ug/dL (ref 45–182)
Saturation Ratios: 40 % — ABNORMAL HIGH (ref 17.9–39.5)
TIBC: 125 ug/dL — ABNORMAL LOW (ref 250–450)
UIBC: 75 ug/dL

## 2018-11-03 LAB — CBC
HCT: 28.4 % — ABNORMAL LOW (ref 39.0–52.0)
Hemoglobin: 8.5 g/dL — ABNORMAL LOW (ref 13.0–17.0)
MCH: 25.5 pg — ABNORMAL LOW (ref 26.0–34.0)
MCHC: 29.9 g/dL — ABNORMAL LOW (ref 30.0–36.0)
MCV: 85.3 fL (ref 80.0–100.0)
Platelets: 335 10*3/uL (ref 150–400)
RBC: 3.33 MIL/uL — ABNORMAL LOW (ref 4.22–5.81)
RDW: 17.4 % — ABNORMAL HIGH (ref 11.5–15.5)
WBC: 10.6 10*3/uL — ABNORMAL HIGH (ref 4.0–10.5)
nRBC: 0 % (ref 0.0–0.2)

## 2018-11-03 LAB — PREPARE RBC (CROSSMATCH)

## 2018-11-03 LAB — VITAMIN B12: Vitamin B-12: 966 pg/mL — ABNORMAL HIGH (ref 180–914)

## 2018-11-03 LAB — TROPONIN I (HIGH SENSITIVITY)
Troponin I (High Sensitivity): 12 ng/L (ref <18)
Troponin I (High Sensitivity): 11 ng/L (ref <18)

## 2018-11-03 LAB — FERRITIN: Ferritin: 474 ng/mL — ABNORMAL HIGH (ref 24–336)

## 2018-11-03 LAB — TSH: TSH: 3.838 u[IU]/mL (ref 0.350–4.500)

## 2018-11-03 LAB — PHOSPHORUS: Phosphorus: 4.2 mg/dL (ref 2.5–4.6)

## 2018-11-03 LAB — SARS CORONAVIRUS 2 BY RT PCR (HOSPITAL ORDER, PERFORMED IN ~~LOC~~ HOSPITAL LAB): SARS Coronavirus 2: NEGATIVE

## 2018-11-03 LAB — MAGNESIUM: Magnesium: 1.7 mg/dL (ref 1.7–2.4)

## 2018-11-03 MED ORDER — IPRATROPIUM BROMIDE 0.02 % IN SOLN: 0.5000 mg | Freq: Four times a day (QID) | RESPIRATORY_TRACT | Status: DC | PRN

## 2018-11-03 MED ORDER — LORATADINE 10 MG PO TABS: 10.0000 mg | ORAL_TABLET | Freq: Every day | ORAL | Status: DC | PRN

## 2018-11-03 MED ORDER — ONDANSETRON HCL 4 MG/2ML IJ SOLN: 4.0000 mg | Freq: Four times a day (QID) | INTRAMUSCULAR | Status: DC | PRN

## 2018-11-03 MED ORDER — POLYETHYLENE GLYCOL 3350 17 G PO PACK: 17.0000 g | PACK | Freq: Every day | ORAL | Status: DC | PRN

## 2018-11-03 MED ORDER — MUSCLE RUB 10-15 % EX CREA
1.0000 "application " | TOPICAL_CREAM | CUTANEOUS | Status: DC | PRN
  Filled 2018-11-03: qty 85

## 2018-11-03 MED ORDER — HYDRALAZINE HCL 20 MG/ML IJ SOLN: 10.0000 mg | INTRAMUSCULAR | Status: DC | PRN

## 2018-11-03 MED ORDER — LEVETIRACETAM 500 MG PO TABS
500.0000 mg | ORAL_TABLET | Freq: Two times a day (BID) | ORAL | Status: DC
  Administered 2018-11-03 – 2018-11-04 (×3): 500 mg via ORAL
  Filled 2018-11-03 (×3): qty 1

## 2018-11-03 MED ORDER — ACETAMINOPHEN 325 MG PO TABS: 650.0000 mg | ORAL_TABLET | Freq: Four times a day (QID) | ORAL | Status: DC | PRN

## 2018-11-03 MED ORDER — CLONIDINE HCL 0.2 MG PO TABS
0.2000 mg | ORAL_TABLET | Freq: Three times a day (TID) | ORAL | Status: DC
  Administered 2018-11-03 – 2018-11-04 (×4): 0.2 mg via ORAL
  Filled 2018-11-03 (×4): qty 1

## 2018-11-03 MED ORDER — ACETAMINOPHEN 650 MG RE SUPP: 650.0000 mg | Freq: Four times a day (QID) | RECTAL | Status: DC | PRN

## 2018-11-03 MED ORDER — PANTOPRAZOLE SODIUM 40 MG IV SOLR
40.0000 mg | Freq: Two times a day (BID) | INTRAVENOUS | Status: DC
  Administered 2018-11-03 – 2018-11-04 (×3): 40 mg via INTRAVENOUS
  Filled 2018-11-03 (×3): qty 40

## 2018-11-03 MED ORDER — LIP MEDEX EX OINT: 1.0000 "application " | TOPICAL_OINTMENT | CUTANEOUS | Status: DC | PRN

## 2018-11-03 MED ORDER — SALINE SPRAY 0.65 % NA SOLN
1.0000 | NASAL | Status: DC | PRN
  Filled 2018-11-03: qty 44

## 2018-11-03 MED ORDER — ATORVASTATIN CALCIUM 20 MG PO TABS
20.0000 mg | ORAL_TABLET | Freq: Every day | ORAL | Status: DC
  Administered 2018-11-03: 20 mg via ORAL
  Filled 2018-11-03: qty 1

## 2018-11-03 MED ORDER — PHENOL 1.4 % MT LIQD
1.0000 | OROMUCOSAL | Status: DC | PRN
  Filled 2018-11-03: qty 177

## 2018-11-03 MED ORDER — POLYVINYL ALCOHOL 1.4 % OP SOLN
1.0000 [drp] | OPHTHALMIC | Status: DC | PRN
  Filled 2018-11-03: qty 15

## 2018-11-03 MED ORDER — CITALOPRAM HYDROBROMIDE 20 MG PO TABS
40.0000 mg | ORAL_TABLET | Freq: Every day | ORAL | Status: DC
  Administered 2018-11-03 – 2018-11-04 (×2): 40 mg via ORAL
  Filled 2018-11-03 (×2): qty 2

## 2018-11-03 MED ORDER — VILAZODONE HCL 40 MG PO TABS
40.0000 mg | ORAL_TABLET | Freq: Every day | ORAL | Status: DC
  Administered 2018-11-03 – 2018-11-04 (×2): 40 mg via ORAL
  Filled 2018-11-03 (×2): qty 1

## 2018-11-03 MED ORDER — SODIUM CHLORIDE 0.9 % IV SOLN
INTRAVENOUS | Status: DC
  Administered 2018-11-03: 10:00:00 via INTRAVENOUS

## 2018-11-03 MED ORDER — FUROSEMIDE 20 MG PO TABS: 20.0000 mg | ORAL_TABLET | Freq: Every day | ORAL | Status: DC | PRN

## 2018-11-03 MED ORDER — SODIUM BICARBONATE 650 MG PO TABS
650.0000 mg | ORAL_TABLET | Freq: Every day | ORAL | Status: DC
  Administered 2018-11-03 – 2018-11-04 (×2): 650 mg via ORAL
  Filled 2018-11-03 (×2): qty 1

## 2018-11-03 MED ORDER — BOOST / RESOURCE BREEZE PO LIQD CUSTOM
1.0000 | Freq: Three times a day (TID) | ORAL | Status: DC
  Administered 2018-11-03 – 2018-11-04 (×3): 1 via ORAL

## 2018-11-03 MED ORDER — BISACODYL 5 MG PO TBEC: 5.0000 mg | DELAYED_RELEASE_TABLET | Freq: Every day | ORAL | Status: DC | PRN

## 2018-11-03 MED ORDER — GABAPENTIN 100 MG PO CAPS: 100.0000 mg | ORAL_CAPSULE | Freq: Two times a day (BID) | ORAL | Status: DC

## 2018-11-03 MED ORDER — ONDANSETRON HCL 4 MG PO TABS: 4.0000 mg | ORAL_TABLET | Freq: Four times a day (QID) | ORAL | Status: DC | PRN

## 2018-11-03 MED ORDER — POTASSIUM CHLORIDE CRYS ER 20 MEQ PO TBCR
40.0000 meq | EXTENDED_RELEASE_TABLET | Freq: Once | ORAL | Status: AC
  Administered 2018-11-03: 40 meq via ORAL
  Filled 2018-11-03: qty 2

## 2018-11-03 MED ORDER — CARVEDILOL 25 MG PO TABS
25.0000 mg | ORAL_TABLET | Freq: Two times a day (BID) | ORAL | Status: DC
  Administered 2018-11-03 – 2018-11-04 (×3): 25 mg via ORAL
  Filled 2018-11-03 (×3): qty 1

## 2018-11-03 MED ORDER — GABAPENTIN 100 MG PO CAPS
100.0000 mg | ORAL_CAPSULE | Freq: Two times a day (BID) | ORAL | Status: DC
  Administered 2018-11-03 – 2018-11-04 (×3): 100 mg via ORAL
  Filled 2018-11-03 (×3): qty 1

## 2018-11-03 MED ORDER — ALBUTEROL SULFATE (2.5 MG/3ML) 0.083% IN NEBU: 2.5000 mg | INHALATION_SOLUTION | Freq: Four times a day (QID) | RESPIRATORY_TRACT | Status: DC | PRN

## 2018-11-03 MED ORDER — ALUM & MAG HYDROXIDE-SIMETH 200-200-20 MG/5ML PO SUSP: 30.0000 mL | ORAL | Status: DC | PRN

## 2018-11-03 MED ORDER — ALBUTEROL SULFATE HFA 108 (90 BASE) MCG/ACT IN AERS: 2.0000 | INHALATION_SPRAY | Freq: Four times a day (QID) | RESPIRATORY_TRACT | Status: DC | PRN

## 2018-11-03 MED ORDER — AMLODIPINE BESYLATE 10 MG PO TABS
10.0000 mg | ORAL_TABLET | Freq: Every day | ORAL | Status: DC
  Administered 2018-11-03 – 2018-11-04 (×2): 10 mg via ORAL
  Filled 2018-11-03 (×2): qty 1

## 2018-11-03 MED ORDER — POTASSIUM CHLORIDE 10 MEQ/100ML IV SOLN
INTRAVENOUS | Status: AC
  Filled 2018-11-03: qty 100

## 2018-11-03 MED ORDER — OLANZAPINE 5 MG PO TABS
2.5000 mg | ORAL_TABLET | Freq: Every day | ORAL | Status: DC
  Administered 2018-11-03: 2.5 mg via ORAL
  Filled 2018-11-03: qty 1

## 2018-11-03 MED ORDER — FLUTICASONE FUROATE-VILANTEROL 100-25 MCG/INH IN AEPB
1.0000 | INHALATION_SPRAY | Freq: Every day | RESPIRATORY_TRACT | Status: DC
  Administered 2018-11-03 – 2018-11-04 (×2): 1 via RESPIRATORY_TRACT
  Filled 2018-11-03: qty 28

## 2018-11-03 MED ORDER — HYDROCODONE-ACETAMINOPHEN 5-325 MG PO TABS
1.0000 | ORAL_TABLET | Freq: Two times a day (BID) | ORAL | Status: DC | PRN
  Administered 2018-11-03 – 2018-11-04 (×2): 1 via ORAL
  Filled 2018-11-03 (×2): qty 1

## 2018-11-03 MED ORDER — HYDROCORTISONE (PERIANAL) 2.5 % EX CREA
1.0000 "application " | TOPICAL_CREAM | Freq: Four times a day (QID) | CUTANEOUS | Status: DC | PRN
  Filled 2018-11-03: qty 28.35

## 2018-11-03 MED ORDER — COLCHICINE 0.6 MG PO TABS
0.6000 mg | ORAL_TABLET | Freq: Two times a day (BID) | ORAL | Status: DC | PRN
  Administered 2018-11-04: 0.6 mg via ORAL
  Filled 2018-11-03: qty 1

## 2018-11-03 MED ORDER — MAGNESIUM CITRATE PO SOLN: 1.0000 | Freq: Once | ORAL | Status: DC | PRN

## 2018-11-03 MED ORDER — SODIUM CHLORIDE 0.9 % IV SOLN
INTRAVENOUS | Status: DC
  Administered 2018-11-03: 23:00:00 via INTRAVENOUS

## 2018-11-03 MED ORDER — SIMVASTATIN 40 MG PO TABS: 40.0000 mg | ORAL_TABLET | Freq: Every day | ORAL | Status: DC

## 2018-11-03 MED ORDER — OXYCODONE HCL 5 MG PO TABS: 5.0000 mg | ORAL_TABLET | ORAL | Status: DC | PRN

## 2018-11-03 MED ORDER — HYDROCORTISONE 1 % EX CREA
1.0000 "application " | TOPICAL_CREAM | Freq: Three times a day (TID) | CUTANEOUS | Status: DC | PRN
  Filled 2018-11-03: qty 28

## 2018-11-03 MED ORDER — NICOTINE 21 MG/24HR TD PT24
21.0000 mg | MEDICATED_PATCH | Freq: Every day | TRANSDERMAL | Status: DC
  Administered 2018-11-03 – 2018-11-04 (×2): 21 mg via TRANSDERMAL
  Filled 2018-11-03 (×2): qty 1

## 2018-11-03 MED ORDER — SENNOSIDES-DOCUSATE SODIUM 8.6-50 MG PO TABS: 1.0000 | ORAL_TABLET | Freq: Every evening | ORAL | Status: DC | PRN

## 2018-11-03 MED ORDER — VITAMIN D 25 MCG (1000 UNIT) PO TABS
1000.0000 [IU] | ORAL_TABLET | Freq: Every day | ORAL | Status: DC
  Administered 2018-11-03 – 2018-11-04 (×2): 1000 [IU] via ORAL
  Filled 2018-11-03 (×2): qty 1

## 2018-11-03 MED ORDER — POTASSIUM CHLORIDE 10 MEQ/100ML IV SOLN
10.0000 meq | INTRAVENOUS | Status: AC
  Administered 2018-11-03 (×4): 10 meq via INTRAVENOUS
  Filled 2018-11-03 (×3): qty 100

## 2018-11-03 MED ORDER — GABAPENTIN 300 MG PO CAPS
300.0000 mg | ORAL_CAPSULE | Freq: Every day | ORAL | Status: DC
  Administered 2018-11-03: 300 mg via ORAL
  Filled 2018-11-03 (×2): qty 1

## 2018-11-03 NOTE — Plan of Care (Signed)
Patient has poor appetite. Encouraged to eat more

## 2018-11-03 NOTE — Progress Notes (Signed)
PHARMACY NOTE: CITALOPRAM DOSING On citalopram 40 mg daily as inpatient, continued from PTA medication list.  Patient told medication history technician he is no longer taking this medication.  FDA prescribing information recommends limiting citalopram dosage to 20mg  daily when age > 60 due to increased risk of QTc prolongation and life-threatening arrhythmias.    Recommend: If to continue/resume citalopram, consider risk vs. benefit of current 40 mg/day dosage.  Clayburn Pert, PharmD, BCPS [phone] 11/03/2018  7:27 AM
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
## 2018-11-03 NOTE — Progress Notes (Signed)
PROGRESS NOTE    Hideki Perteet  EAV:[id number] DOB: 30-Oct-1945 DOA: 11/02/2018 PCP: Vladimir Crofts, FNP   Brief Narrative:  73 year old male with history of CML, anemia requiring frequent transfusion, seizure disorder, CKD stage III-4, diverticulosis, who was on hospice at home was brought to the hospital for evaluation of seizure.  Apparently per his significant other patient had 2 episodes of seizure at home, second 1 lasting for about 15 minutes.  Upon admission his work-up was negative besides his hemoglobin was down to below 7, Hemoccult positive.  He was given 2 units of PRBC transfusion.    Assessment & Plan:   Principal Problem:   Symptomatic anemia Active Problems:   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HCC)   Carotid artery aneurysm (HCC)   Essential hypertension   Seizure (HCC)   CKD (chronic kidney disease), stage IV (HCC)  Seizure, generalized seizures -Cont IVF at this time.  CT of the head-negative for acute pathology. -Continue home Keppra 5 mg twice daily.  If has further episodes here, will make adjustments. - Echocardiogram performed-pending -Family and patient in agreement not to get aggressive as patient is on hospice.  We will continue to monitor.  Symptomatic Anemia, hx of Diverticulosis and Hemorrhroids  -Likely diverticular bleed and hemorrhoids as patient has both.  Had endoscopy at the end of March and colonoscopy in April- showed normal endoscopy, has diverticulosis. - Hemoglobin down to 6.5, status post 2 units PRBC transfusion.  Diarrhea, non specific -Resolved?Marland Kitchen  Advised nursing staff that if this persists, check for C. difficile.  CKD Stage III -Around baseline of 2.6  Hx of CML, on Home Hospice  -Follow-up outpatient  Essential HTN -Need aggressive control of this.  Previous MRI has shown patient has questionable PRES  Depression -Continue home medications  Gout -Continue home meds.  Peripheral neuropathy -On gabapentin   DVT prophylaxis:  SCDs due to bleeding Code Status: Full code?  But he is on hospice Family Communication: Spoke with patient's significant other over the phone Disposition Plan: Maintain hospital stay to ensure his hemoglobin remained stable and no longer has further episodes of seizures.  Hopefully discharge him tomorrow.  Consultants:   None  Procedures:   None  Antimicrobials:   None   Subjective: Patient states he feels better this morning, no other episodes of seizures overnight.  Admits of medication compliance.  Denies having any diarrhea this morning.  Review of Systems Otherwise negative except as per HPI, including: General: Denies fever, chills, night sweats or unintended weight loss. Resp: Denies cough, wheezing, shortness of breath. Cardiac: Denies chest pain, palpitations, orthopnea, paroxysmal nocturnal dyspnea. GI: Denies abdominal pain, nausea, vomiting, diarrhea or constipation GU: Denies dysuria, frequency, hesitancy or incontinence MS: Denies muscle aches, joint pain or swelling Neuro: Denies headache, neurologic deficits (focal weakness, numbness, tingling), abnormal gait Psych: Denies anxiety, depression, SI/HI/AVH Skin: Denies new rashes or lesions ID: Denies sick contacts, exotic exposures, travel  Objective: Vitals:   11/03/18 0557 11/03/18 0612 11/03/18 0840 11/03/18 1134  BP: (!) 184/77 (!) 170/80 117/87   Pulse: 68 63 77   Resp: 16 15 (!) 22   Temp: 98.5 F (36.9 C) 98.5 F (36.9 C) 98.4 F (36.9 C)   TempSrc: Oral Oral Oral   SpO2: 100% 100% 99% 100%  Weight:      Height:        Intake/Output Summary (Last 24 hours) at 11/03/2018 1139 Last data filed at 11/03/2018 1009 Gross per 24 hour  Intake 1109 ml  Output 430 ml  Net 679 ml   Filed Weights   11/03/18 0325  Weight: 51.5 kg    Examination:  General exam: Appears calm and comfortable, elderly frail-appearing Respiratory system: Clear to auscultation. Respiratory effort normal.  Cardiovascular system: S1 & S2 heard, RRR. No JVD, murmurs, rubs, gallops or clicks. No pedal edema. Gastrointestinal system: Abdomen is nondistended, soft and nontender. No organomegaly or masses felt. Normal bowel sounds heard. Central nervous system: Alert and oriented. No focal neurological deficits. Extremities: Symmetric 5 x 5 power.  Right sided AKA noted Skin: No rashes, lesions or ulcers Psychiatry: Judgement and insight appear normal. Mood & affect appropriate.     Data Reviewed:   CBC: Recent Labs  Lab 11/02/18 1951  WBC 12.4*  NEUTROABS 10.3*  HGB 6.5*  HCT 21.3*  MCV 82.9  PLT 426*   Basic Metabolic Panel: Recent Labs  Lab 11/02/18 1951  NA 139  K 3.5  CL 109  CO2 17*  GLUCOSE 105*  BUN 43*  CREATININE 2.63*  CALCIUM 8.2*  MG 1.8  PHOS 4.6   GFR: Estimated Creatinine Clearance: 18.2 mL/min (A) (by C-G formula based on SCr of 2.63 mg/dL (H)). Liver Function Tests: No results for input(s): AST, ALT, ALKPHOS, BILITOT, PROT, ALBUMIN in the last 168 hours. No results for input(s): LIPASE, AMYLASE in the last 168 hours. No results for input(s): AMMONIA in the last 168 hours. Coagulation Profile: No results for input(s): INR, PROTIME in the last 168 hours. Cardiac Enzymes: No results for input(s): CKTOTAL, CKMB, CKMBINDEX, TROPONINI in the last 168 hours. BNP (last 3 results) No results for input(s): PROBNP in the last 8760 hours. HbA1C: No results for input(s): HGBA1C in the last 72 hours. CBG: Recent Labs  Lab 11/02/18 2046  GLUCAP 116*   Lipid Profile: No results for input(s): CHOL, HDL, LDLCALC, TRIG, CHOLHDL, LDLDIRECT in the last 72 hours. Thyroid Function Tests: No results for input(s): TSH, T4TOTAL, FREET4, T3FREE, THYROIDAB in the last 72 hours. Anemia Panel: No results for input(s): VITAMINB12, FOLATE, FERRITIN, TIBC, IRON, RETICCTPCT in the last 72 hours. Sepsis Labs: No results for input(s): PROCALCITON, LATICACIDVEN in the last 168  hours.  Recent Results (from the past 240 hour(s))  SARS Coronavirus 2 (CEPHEID - Performed in [facility] hospital lab), Hosp Order     Status: None   Collection Time: 11/02/18 11:23 PM   Specimen: Nasopharyngeal Swab  Result Value Ref Range Status   SARS Coronavirus 2 NEGATIVE NEGATIVE Final    Comment: (NOTE) If result is NEGATIVE SARS-CoV-2 target nucleic acids are NOT DETECTED. The SARS-CoV-2 RNA is generally detectable in upper and lower  respiratory specimens during the acute phase of infection. The lowest  concentration of SARS-CoV-2 viral copies this assay can detect is 250  copies / mL. A negative result does not preclude SARS-CoV-2 infection  and should not be used as the sole basis for treatment or other  patient management decisions.  A negative result may occur with  improper specimen collection / handling, submission of specimen other  than nasopharyngeal swab, presence of viral mutation(s) within the  areas targeted by this assay, and inadequate number of viral copies  (<250 copies / mL). A negative result must be combined with clinical  observations, patient history, and epidemiological information. If result is POSITIVE SARS-CoV-2 target nucleic acids are DETECTED. The SARS-CoV-2 RNA is generally detectable in upper and lower  respiratory specimens dur ing the acute phase of infection.  Positive  results  are indicative of active infection with SARS-CoV-2.  Clinical  correlation with patient history and other diagnostic information is  necessary to determine patient infection status.  Positive results do  not rule out bacterial infection or co-infection with other viruses. If result is PRESUMPTIVE POSTIVE SARS-CoV-2 nucleic acids MAY BE PRESENT.   A presumptive positive result was obtained on the submitted specimen  and confirmed on repeat testing.  While 2019 novel coronavirus  (SARS-CoV-2) nucleic acids may be present in the submitted sample  additional  confirmatory testing may be necessary for epidemiological  and / or clinical management purposes  to differentiate between  SARS-CoV-2 and other Sarbecovirus currently known to infect humans.  If clinically indicated additional testing with an alternate test  methodology [id number]) is advised. The SARS-CoV-2 RNA is generally  detectable in upper and lower respiratory sp ecimens during the acute  phase of infection. The expected result is Negative. Fact Sheet for Patients:  BoilerBrush.com.cy Fact Sheet for Healthcare Providers: https://pope.com/ This test is not yet approved or cleared by the [place] FDA and has been authorized for detection and/or diagnosis of SARS-CoV-2 by FDA under an Emergency Use Authorization (EUA).  This EUA will remain in effect (meaning this test can be used) for the duration of the COVID-19 declaration under Section 564(b)(1) of the Act, 21 U.S.C. section 360bbb-3(b)(1), unless the authorization is terminated or revoked sooner. Performed at [facility], [address], [place], [place] [address]          Radiology Studies: Ct Head Wo Contrast  Result Date: 11/03/2018 CLINICAL DATA:  73 year old male with syncope and seizure. EXAM: CT HEAD WITHOUT CONTRAST TECHNIQUE: Contiguous axial images were obtained from the base of the skull through the vertex without intravenous contrast. COMPARISON:  CT of the abdomen pelvis dated 09/09/2019 FINDINGS: Brain: Mild age-related atrophy and moderate chronic microvascular ischemic changes. There is no acute intracranial hemorrhage. No mass effect or midline shift. No extra-axial fluid collection. Vascular: No hyperdense vessel or unexpected calcification. Skull: Normal. Negative for fracture or focal lesion. Sinuses/Orbits: No acute finding. Other: None IMPRESSION: 1. No acute intracranial hemorrhage. 2. Age-related atrophy and chronic microvascular  ischemic changes. Electronically Signed   By: Elgie Collard M.D.   On: 11/03/2018 00:32        Scheduled Meds: . amLODipine  10 mg Oral Daily  . atorvastatin  20 mg Oral q1800  . carvedilol  25 mg Oral BID WC  . cholecalciferol  1,000 Units Oral Daily  . citalopram  40 mg Oral Daily  . cloNIDine  0.2 mg Oral TID  . feeding supplement  1 Container Oral TID BM  . fluticasone furoate-vilanterol  1 puff Inhalation Daily  . gabapentin  100 mg Oral BID WC  . gabapentin  300 mg Oral QHS  . levETIRAcetam  500 mg Oral BID  . nicotine  21 mg Transdermal Daily  . OLANZapine  2.5 mg Oral QHS  . pantoprazole (PROTONIX) IV  40 mg Intravenous Q12H  . sodium bicarbonate  650 mg Oral Daily  . Vilazodone HCl  40 mg Oral Daily   Continuous Infusions: . sodium chloride    . sodium chloride 75 mL/hr at 11/03/18 1009     LOS: 1 day   Time spent= 35 mins    Kamera Dubas Joline Maxcy, MD [facility]  If 7PM-7AM, please contact night-coverage [email or web address] 11/03/2018, 11:39 AM

## 2018-11-03 NOTE — ED Notes (Signed)
Called 4E for update on when to call report. They said to call back in 10-15 min.

## 2018-11-03 NOTE — ED Notes (Signed)
Blood consent electronically signed in Epic

## 2018-11-03 NOTE — Progress Notes (Signed)
MD informed via text page K+ 2.8

## 2018-11-03 NOTE — Progress Notes (Signed)
  Echocardiogram 2D Echocardiogram has been performed.  Fradel Baldonado L Androw 11/03/2018, 10:34 AM

## 2018-11-03 NOTE — ED Notes (Signed)
Mahonri Seiden (daughter) called and requested information regarding pt at [name]. I asked the pt if I could discuss his medical care with Tiffany, and he said yes.

## 2018-11-03 NOTE — ED Notes (Signed)
Patient transported to CT 

## 2018-11-04 LAB — BASIC METABOLIC PANEL
Anion gap: 7 (ref 5–15)
Anion gap: 7 (ref 5–15)
BUN: 35 mg/dL — ABNORMAL HIGH (ref 8–23)
BUN: 34 mg/dL — ABNORMAL HIGH (ref 8–23)
CO2: 16 mmol/L — ABNORMAL LOW (ref 22–32)
CO2: 16 mmol/L — ABNORMAL LOW (ref 22–32)
Calcium: 7.7 mg/dL — ABNORMAL LOW (ref 8.9–10.3)
Calcium: 7.9 mg/dL — ABNORMAL LOW (ref 8.9–10.3)
Chloride: 117 mmol/L — ABNORMAL HIGH (ref 98–111)
Chloride: 117 mmol/L — ABNORMAL HIGH (ref 98–111)
Creatinine, Ser: 2.31 mg/dL — ABNORMAL HIGH (ref 0.61–1.24)
Creatinine, Ser: 2.22 mg/dL — ABNORMAL HIGH (ref 0.61–1.24)
GFR calc Af Amer: 31 mL/min — ABNORMAL LOW (ref >60)
GFR calc Af Amer: 33 mL/min — ABNORMAL LOW (ref >60)
GFR calc non Af Amer: 27 mL/min — ABNORMAL LOW (ref >60)
GFR calc non Af Amer: 28 mL/min — ABNORMAL LOW (ref >60)
Glucose, Bld: 101 mg/dL — ABNORMAL HIGH (ref 70–99)
Glucose, Bld: 83 mg/dL (ref 70–99)
Potassium: 4 mmol/L (ref 3.5–5.1)
Potassium: 3.8 mmol/L (ref 3.5–5.1)
Sodium: 140 mmol/L (ref 135–145)
Sodium: 140 mmol/L (ref 135–145)

## 2018-11-04 LAB — TYPE AND SCREEN
ABO/RH(D): O POS
Antibody Screen: NEGATIVE
Unit division: 0
Unit division: 0

## 2018-11-04 LAB — GASTROINTESTINAL PANEL BY PCR, STOOL (REPLACES STOOL CULTURE)

## 2018-11-04 LAB — GLUCOSE, CAPILLARY: Glucose-Capillary: 80 mg/dL (ref 70–99)

## 2018-11-04 LAB — BPAM RBC
Blood Product Expiration Date: 202007242359
Blood Product Expiration Date: 202007242359
ISSUE DATE / TIME: 202006270247
ISSUE DATE / TIME: 202006270545
Unit Type and Rh: 5100
Unit Type and Rh: 5100

## 2018-11-04 LAB — TROPONIN I (HIGH SENSITIVITY): Troponin I (High Sensitivity): 9 ng/L (ref <18)

## 2018-11-04 LAB — CBC
HCT: 23.6 % — ABNORMAL LOW (ref 39.0–52.0)
HCT: 26.6 % — ABNORMAL LOW (ref 39.0–52.0)
Hemoglobin: 7.2 g/dL — ABNORMAL LOW (ref 13.0–17.0)
Hemoglobin: 7.9 g/dL — ABNORMAL LOW (ref 13.0–17.0)
MCH: 25.7 pg — ABNORMAL LOW (ref 26.0–34.0)
MCH: 26 pg (ref 26.0–34.0)
MCHC: 29.7 g/dL — ABNORMAL LOW (ref 30.0–36.0)
MCHC: 30.5 g/dL (ref 30.0–36.0)
MCV: 85.2 fL (ref 80.0–100.0)
MCV: 86.6 fL (ref 80.0–100.0)
Platelets: 244 10*3/uL (ref 150–400)
Platelets: 249 10*3/uL (ref 150–400)
RBC: 2.77 MIL/uL — ABNORMAL LOW (ref 4.22–5.81)
RBC: 3.07 MIL/uL — ABNORMAL LOW (ref 4.22–5.81)
RDW: 17.9 % — ABNORMAL HIGH (ref 11.5–15.5)
RDW: 18.1 % — ABNORMAL HIGH (ref 11.5–15.5)
WBC: 9 10*3/uL (ref 4.0–10.5)
WBC: 9.3 10*3/uL (ref 4.0–10.5)
nRBC: 0 % (ref 0.0–0.2)
nRBC: 0 % (ref 0.0–0.2)

## 2018-11-04 LAB — MAGNESIUM: Magnesium: 1.6 mg/dL — ABNORMAL LOW (ref 1.7–2.4)

## 2018-11-04 MED ORDER — CALCIUM GLUCONATE-NACL 1-0.675 GM/50ML-% IV SOLN
1.0000 g | Freq: Once | INTRAVENOUS | Status: AC
  Administered 2018-11-04: 1000 mg via INTRAVENOUS
  Filled 2018-11-04: qty 50

## 2018-11-04 MED ORDER — IPRATROPIUM BROMIDE 0.02 % IN SOLN: 0.5000 mg | Freq: Four times a day (QID) | RESPIRATORY_TRACT | Status: DC | PRN

## 2018-11-04 MED ORDER — OXYCODONE HCL 5 MG PO TABS: 5.0000 mg | ORAL_TABLET | ORAL | Status: DC | PRN

## 2018-11-04 MED ORDER — BISACODYL 5 MG PO TBEC: 5.0000 mg | DELAYED_RELEASE_TABLET | Freq: Every day | ORAL | Status: DC | PRN

## 2018-11-04 MED ORDER — MAGNESIUM SULFATE 2 GM/50ML IV SOLN
2.0000 g | Freq: Once | INTRAVENOUS | Status: AC
  Administered 2018-11-04: 2 g via INTRAVENOUS
  Filled 2018-11-04: qty 50

## 2018-11-04 NOTE — Progress Notes (Signed)
Nsg Discharge Note  Admit Date:  11/02/2018 Discharge date: 11/04/2018   Rai Severns to be D/c home per MD order.  AVS completed. Patient/caregiver able to verbalize understanding. Patient Care taker [name] made aware and waiting for him.  Discharge Medication: Allergies as of 11/04/2018      Reactions   Iohexol Hives, Itching, Other (See Comments)   Patient has itching and hives, needs 13 hour prep      Medication List    TAKE these medications   acetaminophen 325 MG tablet Commonly known as: TYLENOL Take 2 tablets (650 mg total) by mouth every 4 (four) hours as needed for mild pain (or temp > 37.5 C (99.5 F)).   albuterol 108 (90 Base) MCG/ACT inhaler Commonly known as: Ventolin HFA Inhale 2 puffs into the lungs every 6 (six) hours as needed for wheezing or shortness of breath.   amLODipine 10 MG tablet Commonly known as: NORVASC Take 1 tablet (10 mg total) by mouth daily. Notes to patient: Next Dose 6/29   Aspirin-Caffeine 845-65 MG Pack Take 1 Package by mouth every 6 (six) hours as needed (pain).   carvedilol 25 MG tablet Commonly known as: COREG Take 2 tablets (50 mg total) by mouth 2 (two) times daily with a meal. What changed: how much to take Notes to patient: Next dose 6/28   cholecalciferol 25 MCG (1000 UT) tablet Commonly known as: VITAMIN D3 Take 1,000 Units by mouth daily. Notes to patient: Next Dose 6/29   citalopram 40 MG tablet Commonly known as: CELEXA Take 40 mg by mouth daily. Notes to patient: Next Dose 6/29   cloNIDine 0.2 MG tablet Commonly known as: CATAPRES Take 1 tablet (0.2 mg total) by mouth 3 (three) times daily. Notes to patient: Next Dose 6/28   Colcrys 0.6 MG tablet Generic drug: colchicine TAKE 1 TABLET (0.6 MG TOTAL) BY MOUTH 2 (TWO) TIMES DAILY. What changed: See the new instructions. Notes to patient: Next Dose 6/28   diclofenac sodium 1 % Gel Commonly known as: VOLTAREN Apply 2 g topically 4 (four) times daily as  needed. Applies to knee for pain   diphenoxylate-atropine 2.5-0.025 MG tablet Commonly known as: LOMOTIL Take 1 tablet by mouth 4 (four) times daily as needed for diarrhea or loose stools.   furosemide 20 MG tablet Commonly known as: LASIX Take 20 mg by mouth daily as needed (foot, ankle, leg swelling).   gabapentin 300 MG capsule Commonly known as: NEURONTIN 300 mg at bedtime. What changed: Another medication with the same name was changed. Make sure you understand how and when to take each. Notes to patient: Next Dose 6/28   gabapentin 100 MG capsule Commonly known as: Neurontin Take 1 capsule (100 mg total) by mouth daily. What changed: when to take this Notes to patient: Next Dose 6/29   HYDROcodone-acetaminophen 5-325 MG tablet Commonly known as: NORCO/VICODIN Take 1 tablet by mouth every 12 (twelve) hours as needed for moderate pain.   levETIRAcetam 500 MG tablet Commonly known as: Keppra Take 1 tablet (500 mg total) by mouth 2 (two) times daily. Notes to patient: Next Dose 6/28   megestrol 40 MG tablet Commonly known as: MEGACE Take 40 mg by mouth daily. Notes to patient: Next Dose 6/29   nicotine 21 mg/24hr patch Commonly known as: NICODERM CQ - dosed in mg/24 hours Place 1 patch (21 mg total) onto the skin daily. Notes to patient: Next Dose 6/29   OLANZapine 2.5 MG tablet Commonly known as: ZYPREXA  Take 1 tablet (2.5 mg total) by mouth at bedtime. Notes to patient: Next Dose 6/28   simvastatin 40 MG tablet Commonly known as: ZOCOR Take 40 mg by mouth daily. Notes to patient: Next Dose 6/29   sodium bicarbonate 650 MG tablet Take 650 mg by mouth daily. Notes to patient: Next Dose 6/29   Symbicort 80-4.5 MCG/ACT inhaler Generic drug: budesonide-formoterol Inhale 2 puffs into the lungs 2 (two) times daily. What changed:   when to take this  reasons to take this Notes to patient: Next Dose 6/28   Viibryd 40 MG Tabs Generic drug: Vilazodone  HCl Take 40 mg by mouth daily. Notes to patient: Next Dose 6/29   Vitamin D (Ergocalciferol) 1.25 MG (50000 UT) Caps capsule Commonly known as: DRISDOL Take 50,000 Units by mouth every 30 (thirty) days. Notes to patient: Once a month       Discharge Assessment: Vitals:   11/04/18 1119 11/04/18 1415  BP:  (!) 147/72  Pulse:  (!) 51  Resp:    Temp:  98.1 F (36.7 C)  SpO2: 100% 100%   Skin clean, dry and intact without evidence of skin break down, no evidence of skin tears noted. IV catheter discontinued intact. Site without signs and symptoms of complications - no redness or edema noted at insertion site, patient denies c/o pain - only slight tenderness at site.  Dressing with slight pressure applied.  D/c Instructions-Education: Discharge instructions given to patient/family with verbalized understanding. D/c education completed with patient/family including follow up instructions, medication list, d/c activities limitations if indicated, with other d/c instructions as indicated by MD - patient able to verbalize understanding, all questions fully answered. Patient instructed to return to ED, call 911, or call MD for any changes in condition.  Patient escorted via PTAR .  Xavier Munger, Jolene Schimke, RN 11/04/2018 2:27 PM

## 2018-11-04 NOTE — TOC Initial Note (Signed)
Transition of Care [facility]) - Initial/Assessment Note    Patient Details  Name: [name] MRN: [id number] Date of Birth: 31-Mar-1946  [facility] CM/SW Contact:    Joaquin Courts, RN Phone Number: 11/04/2018, 1:05 PM  Clinical Narrative:                 CM spoke with [name] (caregiver) over the telephone to confirm that patient does have close to 24-hr care at home. [name] states she stays with the patient most of the day, leaving once he is asleep. Reports patient has a hospital bed at home and that she feels comfortable providing all needed care. Patient is aslo active with palliative care and [name] is making care plans for the future when patients care exceed her abilities. Patient will be transported home by [facility].     Expected Discharge Plan: Home/Self Care Barriers to Discharge: No Barriers Identified   Patient Goals and CMS Choice Patient states their goals for this hospitalization and ongoing recovery are:: to go home      Expected Discharge Plan and Services Expected Discharge Plan: Home/Self Care   Discharge Planning Services: CM Consult   Living arrangements for the past 2 months: Apartment Expected Discharge Date: 11/04/18               DME Arranged: N/A DME Agency: NA       HH Arranged: NA HH Agency: NA        Prior Living Arrangements/Services Living arrangements for the past 2 months: Apartment Lives with:: Self Patient language and need for interpreter reviewed:: Yes Do you feel safe going back to the place where you live?: Yes      Need for Family Participation in Patient Care: Yes (Comment) Care giver support system in place?: Yes (comment)   Criminal Activity/Legal Involvement Pertinent to Current Situation/Hospitalization: No - Comment as needed  Activities of Daily Living   ADL Screening (condition at time of admission) Patient's cognitive ability adequate to safely complete daily activities?: Yes Is the patient deaf or  have difficulty hearing?: No Does the patient have difficulty seeing, even when wearing glasses/contacts?: No Does the patient have difficulty concentrating, remembering, or making decisions?: Yes Patient able to express need for assistance with ADLs?: Yes Does the patient have difficulty dressing or bathing?: Yes Independently performs ADLs?: No Does the patient have difficulty walking or climbing stairs?: Yes Weakness of Legs: Left Weakness of Arms/Hands: Both  Permission Sought/Granted   Permission granted to share information with : Yes, Verbal Permission Granted  Share Information with NAME: [name]     Permission granted to share info w Relationship: care-giver/ significant other     Emotional Assessment Appearance:: Appears stated age Attitude/Demeanor/Rapport: Engaged Affect (typically observed): Accepting Orientation: : Fluctuating Orientation (Suspected and/or reported [place])   Psych Involvement: No (comment)  Admission diagnosis:  Diarrhea of presumed infectious origin [R19.7] Heme positive stool [R19.5] Symptomatic anemia [D64.9] Patient Active Problem List   Diagnosis Date Noted  . Symptomatic anemia 11/02/2018  . Pressure injury of skin 09/12/2018  . Acute lower UTI 09/08/2018  . Nausea vomiting and diarrhea 09/08/2018  . Acute kidney failure ([employer]) 09/08/2018  . Dyspnea 08/05/2018  . Occult blood in stools   . Microcytic anemia   . Chest pain of uncertain etiology   . Troponin level elevated   . CKD (chronic kidney disease), stage IV ([place])   . Malnutrition of moderate degree 06/22/2018  . Seizure ([place]) 06/21/2018  . Status  epilepticus ([place]) 06/21/2018  . Stroke ([place]) 06/20/2018  . Olecranon bursitis, right elbow 11/16/2016  . Idiopathic chronic gout of multiple sites without tophus 11/16/2016  . Hypernatremia 09/30/2016  . Dehydration 09/30/2016  . Acute gout 09/30/2016  . Severe depression ([place]) 09/30/2016  . Acute metabolic encephalopathy  [id number]/[id number]  . Hypokalemia 09/30/2016  . Chronic pancreatitis ([place]) 09/30/2016  . Exhausted vascular access 09/30/2016  . Leukocytosis 09/30/2016  . Enterococcus UTI 09/30/2016  . Acute respiratory failure with hypoxemia ([place])   . Cellulitis of upper extremity   . Respiratory distress   . Chest pain 09/10/2016  . Muscle cramps 09/10/2016  . Gout attack 04/30/2016  . Unintentional weight loss 04/29/2016  . Pancreatitis 03/14/2016  . Acute kidney injury ([place]) 03/14/2016  . Abdominal pain 03/14/2016  . Hypokalemia 03/14/2016  . Dehydration 01/22/2016  . Intractable nausea and vomiting 01/22/2016  . Alcohol intoxication ([place]) 04/03/2015  . 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 Poor dentition 02/16/2015  . Essential hypertension 02/15/2015  . Tobacco abuse 02/15/2015  . Cellulitis of submandibular region 02/15/2015  . Carotid artery aneurysm ([place]) 01/31/2014  . Painful orthopaedic hardware ([place]) 07/03/2012  . 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[place]) 01/27/2012  . Ankylosis of left knee 06/10/2011   PCP:  Loretha Brasil, FNP Pharmacy:   [id number] [place], [place] [address] [place] [address] Phone: [phone] Fax: [phone]  [facility] [address] [place], [place] [address] Phone: [phone] Fax: [place], [place] [address] Phone: [phone] Fax: [phone]     Social Determinants of Health ([place]) Interventions    Readmission Risk Interventions Readmission Risk Prevention Plan 09/13/2018 08/08/2018  Transportation Screening Complete Complete  Medication Review (RN Care Manager) Complete Complete  PCP or Specialist appointment within 3-5 days of discharge Complete Complete  HRI  or [place] Complete Complete  SW Recovery Care/Counseling Consult Complete Complete  Palliative Care Screening Not Applicable Not [place] Not Applicable Not Applicable  Some recent data might be hidden

## 2018-11-04 NOTE — Progress Notes (Addendum)
Called Patient daughter Ms Lajean Saver  and updated. Patient for discharge today.

## 2018-11-04 NOTE — Discharge Summary (Signed)
Physician Discharge Summary  Keyontae Huckeby HKV:[id number] DOB: 01-09-1946 DOA: 11/02/2018  PCP: Loretha Brasil, FNP  Admit date: 11/02/2018 Discharge date: 11/04/2018  Admitted From: Home Disposition:  Home with hospice  Recommendations for Outpatient Follow-up:  1. Follow up with PCP in 1-2 weeks 2. Please obtain BMP/CBC in one week your next doctors visit.   Discharge Condition: Stable CODE STATUS: DNR Diet recommendation:2g Na   Brief/Interim Summary: 73 year old male with history of CML, anemia requiring frequent transfusion, seizure disorder, CKD stage III-4, diverticulosis, who was on hospice at home was brought to the hospital for evaluation of seizure.  Apparently per his significant other patient had 2 episodes of seizure at home, second 1 lasting for about 15 minutes.  Upon admission his work-up was negative besides his hemoglobin was down to below 7, Hemoccult positive.  He was given 2 units of PRBC transfusion.Hemoglobin remained stable around 7.9 at the time of discharge.  Creatinine function was close to baseline of 2.0, it was 2.2 at the time of discharge.  He had some metabolic acidosis for which patient was advised to stay in the hospital for another day to monitor but he requested to be discharged as he is on hospice.  He will get outpatient lab work in about 1 week. Otherwise patient stable for discharge with outpatient follow-up recommendations as stated above.  Already spoke with his significant other who is in agreement with the plan.   Discharge Diagnoses:  Principal Problem:   Symptomatic anemia Active Problems:   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HCC)   Carotid artery aneurysm (HCC)   Essential hypertension   Seizure (HCC)   CKD (chronic kidney disease), stage IV (HCC)   Seizure, generalized seizures -No further episodes while in the hospital.  CT of the head is negative. -Continue home Keppra 500 mg twice daily. - Echocardiogram-ejection fraction 45% with diffuse  hypokinesis.  Follow-up outpatient with cardiology. -Family and patient in agreement not to get aggressive as patient is on hospice.  We will continue to monitor.  Symptomatic Anemia, hx of Diverticulosis and Hemorrhroids  -Likely diverticular bleed and hemorrhoids as patient has both.  Had endoscopy at the end of March and colonoscopy in April- showed normal endoscopy, has diverticulosis. - Hemoglobin down to 6.5, status post 2 units PRBC transfusion.  Currently hemoglobin is stable at 7.8.  Diarrhea, non specific -Resolved per patient.  Mild metabolic acidosis -Cardiac.  On hold due to blood clot.  Check: Patient with PCP.  CKD Stage III -Around baseline of 2.6.  Creatinine around baseline.  Hx of CML, on Home Hospice  -Follow-up outpatient  Essential HTN -Need aggressive control of this.  Previous MRI has shown patient has questionable PRES  Depression -Continue home medications  Gout -Continue home meds.  Peripheral neuropathy -On gabapentin   Consultations:  ER consulted neurology over the phone  Subjective: Patient does not have any complaints.  He really wishes to go home and resume his hospital service.  Discharge Exam: Vitals:   11/03/18 1950 11/04/18 0552  BP: 122/68 (!) 164/83  Pulse: (!) 58 (!) 56  Resp: 15 17  Temp: 98.3 F (36.8 C) 98.5 F (36.9 C)  SpO2: 99% 100%   Vitals:   11/03/18 1134 11/03/18 1318 11/03/18 1950 11/04/18 0552  BP:  (!) 160/78 122/68 (!) 164/83  Pulse:  (!) 55 (!) 58 (!) 56  Resp:  20 15 17   Temp:  98.4 F (36.9 C) 98.3 F (36.8 C) 98.5 F (36.9 C)  TempSrc:  Oral Oral Oral  SpO2: 100% 100% 99% 100%  Weight:      Height:        General: Pt is alert, awake, not in acute distress Cardiovascular: RRR, S1/S2 +, no rubs, no gallops Respiratory: CTA bilaterally, no wheezing, no rhonchi Abdominal: Soft, NT, ND, bowel sounds + Extremities: no edema, no cyanosis Generallly will and weak appearing.   Discharge  Instructions  Discharge Instructions    Call MD for:  difficulty breathing, headache or visual disturbances   Complete by: As directed    Call MD for:  persistant dizziness or light-headedness   Complete by: As directed    Call MD for:  persistant nausea and vomiting   Complete by: As directed    Diet - low sodium heart healthy   Complete by: As directed    Increase activity slowly   Complete by: As directed      Allergies as of 11/04/2018      Reactions   Iohexol Hives, Itching, Other (See Comments)   Patient has itching and hives, needs 13 hour prep      Medication List    TAKE these medications   acetaminophen 325 MG tablet Commonly known as: TYLENOL Take 2 tablets (650 mg total) by mouth every 4 (four) hours as needed for mild pain (or temp > 37.5 C (99.5 F)).   albuterol 108 (90 Base) MCG/ACT inhaler Commonly known as: Ventolin HFA Inhale 2 puffs into the lungs every 6 (six) hours as needed for wheezing or shortness of breath.   amLODipine 10 MG tablet Commonly known as: NORVASC Take 1 tablet (10 mg total) by mouth daily.   Aspirin-Caffeine 845-65 MG Pack Take 1 Package by mouth every 6 (six) hours as needed (pain).   carvedilol 25 MG tablet Commonly known as: COREG Take 2 tablets (50 mg total) by mouth 2 (two) times daily with a meal. What changed: how much to take   cholecalciferol 25 MCG (1000 UT) tablet Commonly known as: VITAMIN D3 Take 1,000 Units by mouth daily.   citalopram 40 MG tablet Commonly known as: CELEXA Take 40 mg by mouth daily.   cloNIDine 0.2 MG tablet Commonly known as: CATAPRES Take 1 tablet (0.2 mg total) by mouth 3 (three) times daily.   Colcrys 0.6 MG tablet Generic drug: colchicine TAKE 1 TABLET (0.6 MG TOTAL) BY MOUTH 2 (TWO) TIMES DAILY. What changed: See the new instructions.   diclofenac sodium 1 % Gel Commonly known as: VOLTAREN Apply 2 g topically 4 (four) times daily as needed. Applies to knee for pain    diphenoxylate-atropine 2.5-0.025 MG tablet Commonly known as: LOMOTIL Take 1 tablet by mouth 4 (four) times daily as needed for diarrhea or loose stools.   furosemide 20 MG tablet Commonly known as: LASIX Take 20 mg by mouth daily as needed (foot, ankle, leg swelling).   gabapentin 300 MG capsule Commonly known as: NEURONTIN 300 mg at bedtime. What changed: Another medication with the same name was changed. Make sure you understand how and when to take each.   gabapentin 100 MG capsule Commonly known as: Neurontin Take 1 capsule (100 mg total) by mouth daily. What changed: when to take this   HYDROcodone-acetaminophen 5-325 MG tablet Commonly known as: NORCO/VICODIN Take 1 tablet by mouth every 12 (twelve) hours as needed for moderate pain.   levETIRAcetam 500 MG tablet Commonly known as: Keppra Take 1 tablet (500 mg total) by mouth 2 (two) times daily.   megestrol  40 MG tablet Commonly known as: MEGACE Take 40 mg by mouth daily.   nicotine 21 mg/24hr patch Commonly known as: NICODERM CQ - dosed in mg/24 hours Place 1 patch (21 mg total) onto the skin daily.   OLANZapine 2.5 MG tablet Commonly known as: ZYPREXA Take 1 tablet (2.5 mg total) by mouth at bedtime.   simvastatin 40 MG tablet Commonly known as: ZOCOR Take 40 mg by mouth daily.   sodium bicarbonate 650 MG tablet Take 650 mg by mouth daily.   Symbicort 80-4.5 MCG/ACT inhaler Generic drug: budesonide-formoterol Inhale 2 puffs into the lungs 2 (two) times daily. What changed:   when to take this  reasons to take this   Viibryd 40 MG Tabs Generic drug: Vilazodone HCl Take 40 mg by mouth daily.   Vitamin D (Ergocalciferol) 1.25 MG (50000 UT) Caps capsule Commonly known as: DRISDOL Take 50,000 Units by mouth every 30 (thirty) days.      Follow-up Information    Loretha Brasil, FNP. Schedule an appointment as soon as possible for a visit in 1 week(s).   Specialty: Family Medicine Contact  information: [facility] [place] [address] [phone]        Jerline Pain, MD .   Specialty: Cardiology Contact information: [phone] [place] [address] [phone]          Allergies  Allergen Reactions  . Iohexol Hives, Itching and Other (See Comments)    Patient has itching and hives, needs 13 hour prep    You were cared for by a hospitalist during your hospital stay. If you have any questions about your discharge medications or the care you received while you were in the hospital after you are discharged, you can call the unit and asked to speak with the hospitalist on call if the hospitalist that took care of you is not available. Once you are discharged, your primary care physician will handle any further medical issues. Please note that no refills for any discharge medications will be authorized once you are discharged, as it is imperative that you return to your primary care physician (or establish a relationship with a primary care physician if you do not have one) for your aftercare needs so that they can reassess your need for medications and monitor your lab values.   Procedures/Studies: Ct Head Wo Contrast  Result Date: 11/03/2018 CLINICAL DATA:  73 year old male with syncope and seizure. EXAM: CT HEAD WITHOUT CONTRAST TECHNIQUE: Contiguous axial images were obtained from the base of the skull through the vertex without intravenous contrast. COMPARISON:  CT of the abdomen pelvis dated 09/09/2019 FINDINGS: Brain: Mild age-related atrophy and moderate chronic microvascular ischemic changes. There is no acute intracranial hemorrhage. No mass effect or midline shift. No extra-axial fluid collection. Vascular: No hyperdense vessel or unexpected calcification. Skull: Normal. Negative for fracture or focal lesion. Sinuses/Orbits: No acute finding. Other: None IMPRESSION: 1. No acute intracranial hemorrhage. 2. Age-related  atrophy and chronic microvascular ischemic changes. Electronically Signed   By: Anner Crete M.D.   On: 11/03/2018 00:32      The results of significant diagnostics from this hospitalization (including imaging, microbiology, ancillary and laboratory) are listed below for reference.     Microbiology: Recent Results (from the past 240 hour(s))  SARS Coronavirus 2 (CEPHEID - Performed in [place] hospital lab), Hosp Order     Status: None   Collection Time: 11/02/18 11:23 PM   Specimen:  Nasopharyngeal Swab  Result Value Ref Range Status   SARS Coronavirus 2 NEGATIVE NEGATIVE Final    Comment: (NOTE) If result is NEGATIVE SARS-CoV-2 target nucleic acids are NOT DETECTED. The SARS-CoV-2 RNA is generally detectable in upper and lower  respiratory specimens during the acute phase of infection. The lowest  concentration of SARS-CoV-2 viral copies this assay can detect is 250  copies / mL. A negative result does not preclude SARS-CoV-2 infection  and should not be used as the sole basis for treatment or other  patient management decisions.  A negative result may occur with  improper specimen collection / handling, submission of specimen other  than nasopharyngeal swab, presence of viral mutation(s) within the  areas targeted by this assay, and inadequate number of viral copies  (<250 copies / mL). A negative result must be combined with clinical  observations, patient history, and epidemiological information. If result is POSITIVE SARS-CoV-2 target nucleic acids are DETECTED. The SARS-CoV-2 RNA is generally detectable in upper and lower  respiratory specimens dur ing the acute phase of infection.  Positive  results are indicative of active infection with SARS-CoV-2.  Clinical  correlation with patient history and other diagnostic information is  necessary to determine patient infection status.  Positive results do  not rule out bacterial infection or co-infection with other  viruses. If result is PRESUMPTIVE POSTIVE SARS-CoV-2 nucleic acids MAY BE PRESENT.   A presumptive positive result was obtained on the submitted specimen  and confirmed on repeat testing.  While 2019 novel coronavirus  (SARS-CoV-2) nucleic acids may be present in the submitted sample  additional confirmatory testing may be necessary for epidemiological  and / or clinical management purposes  to differentiate between  SARS-CoV-2 and other Sarbecovirus currently known to infect humans.  If clinically indicated additional testing with an alternate test  methodology [phone]) is advised. The SARS-CoV-2 RNA is generally  detectable in upper and lower respiratory sp ecimens during the acute  phase of infection. The expected result is Negative. Fact Sheet for Patients:  StrictlyIdeas.no Fact Sheet for Healthcare Providers: BankingDealers.co.za This test is not yet approved or cleared by the [place] FDA and has been authorized for detection and/or diagnosis of SARS-CoV-2 by FDA under an Emergency Use Authorization (EUA).  This EUA will remain in effect (meaning this test can be used) for the duration of the COVID-19 declaration under Section 564(b)(1) of the Act, 21 U.S.C. section 360bbb-3(b)(1), unless the authorization is terminated or revoked sooner. Performed at [facility], [place] [address], [place], [place] [address]   C difficile quick scan w PCR reflex     Status: None   Collection Time: 11/03/18  8:35 AM   Specimen: STOOL  Result Value Ref Range Status   C Diff antigen NEGATIVE NEGATIVE Final   C Diff toxin NEGATIVE NEGATIVE Final   C Diff interpretation No C. difficile detected.  Final    Comment: Performed at [facility], [place] [address], [place], [place] [address]     Labs: BNP (last 3 results) Recent Labs    08/05/18 0745  BNP 6,644.0*   Basic Metabolic Panel: Recent Labs  Lab  11/02/18 1951 11/03/18 1136 11/03/18 1739 11/04/18 0025 11/04/18 0805  NA 139 141  --  140 140  K 3.5 2.8*  --  4.0 3.8  CL 109 113*  --  117* 117*  CO2 17* 18*  --  16* 16*  GLUCOSE 105* 120*  --  101*  83  BUN 43* 39*  --  35* 34*  CREATININE 2.63* 2.40*  --  2.31* 2.22*  CALCIUM 8.2* 8.1*  --  7.7* 7.9*  MG 1.8  --  1.7 1.6*  --   PHOS 4.6  --  4.2  --   --    Liver Function Tests: No results for input(s): AST, ALT, ALKPHOS, BILITOT, PROT, ALBUMIN in the last 168 hours. No results for input(s): LIPASE, AMYLASE in the last 168 hours. No results for input(s): AMMONIA in the last 168 hours. CBC: Recent Labs  Lab 11/02/18 1951 11/03/18 1136 11/04/18 0025 11/04/18 0805  WBC 12.4* 10.6* 9.0 9.3  NEUTROABS 10.3*  --   --   --   HGB 6.5* 8.5* 7.2* 7.9*  HCT 21.3* 28.4* 23.6* 26.6*  MCV 82.9 85.3 85.2 86.6  PLT 426* 335 244 249   Cardiac Enzymes: No results for input(s): CKTOTAL, CKMB, CKMBINDEX, TROPONINI in the last 168 hours. BNP: Invalid input(s): POCBNP CBG: Recent Labs  Lab 11/02/18 2046 11/04/18 0755  GLUCAP 116* 80   D-Dimer No results for input(s): DDIMER in the last 72 hours. Hgb A1c No results for input(s): HGBA1C in the last 72 hours. Lipid Profile No results for input(s): CHOL, HDL, LDLCALC, TRIG, CHOLHDL, LDLDIRECT in the last 72 hours. Thyroid function studies Recent Labs    11/03/18 1136  TSH 3.838   Anemia work up Recent Labs    11/03/18 1136  VITAMINB12 966*  FERRITIN 474*  TIBC 125*  IRON 50   Urinalysis    Component Value Date/Time   COLORURINE YELLOW 11/02/2018 2217   APPEARANCEUR CLEAR 11/02/2018 2217   [place] 1.014 11/02/2018 2217   [place] 5.0 11/02/2018 2217   [place] 11/02/2018 2217   [place] 11/02/2018 2217   [place] NEGATIVE 11/02/2018 2217   [place] 11/02/2018 2217   PROTEINUR 100 (A) 11/02/2018 2217   UROBILINOGEN 0.2 03/14/2015 1204   NITRITE NEGATIVE 11/02/2018 2217    LEUKOCYTESUR NEGATIVE 11/02/2018 2217   Sepsis Labs Invalid input(s): PROCALCITONIN,  WBC,  LACTICIDVEN Microbiology Recent Results (from the past 240 hour(s))  SARS Coronavirus 2 (CEPHEID - Performed in [place] hospital lab), Hosp Order     Status: None   Collection Time: 11/02/18 11:23 PM   Specimen: Nasopharyngeal Swab  Result Value Ref Range Status   SARS Coronavirus 2 NEGATIVE NEGATIVE Final    Comment: (NOTE) If result is NEGATIVE SARS-CoV-2 target nucleic acids are NOT DETECTED. The SARS-CoV-2 RNA is generally detectable in upper and lower  respiratory specimens during the acute phase of infection. The lowest  concentration of SARS-CoV-2 viral copies this assay can detect is 250  copies / mL. A negative result does not preclude SARS-CoV-2 infection  and should not be used as the sole basis for treatment or other  patient management decisions.  A negative result may occur with  improper specimen collection / handling, submission of specimen other  than nasopharyngeal swab, presence of viral mutation(s) within the  areas targeted by this assay, and inadequate number of viral copies  (<250 copies / mL). A negative result must be combined with clinical  observations, patient history, and epidemiological information. If result is POSITIVE SARS-CoV-2 target nucleic acids are DETECTED. The SARS-CoV-2 RNA is generally detectable in upper and lower  respiratory specimens dur ing the acute phase of infection.  Positive  results are indicative of active infection with SARS-CoV-2.  Clinical  correlation with patient history and other diagnostic information is  necessary to determine patient infection status.  Positive results do  not rule out bacterial infection or co-infection with other viruses. If result is PRESUMPTIVE POSTIVE SARS-CoV-2 nucleic acids MAY BE PRESENT.   A presumptive positive result was obtained on the submitted specimen  and confirmed on repeat testing.  While  2019 novel coronavirus  (SARS-CoV-2) nucleic acids may be present in the submitted sample  additional confirmatory testing may be necessary for epidemiological  and / or clinical management purposes  to differentiate between  SARS-CoV-2 and other Sarbecovirus currently known to infect humans.  If clinically indicated additional testing with an alternate test  methodology [phone]) is advised. The SARS-CoV-2 RNA is generally  detectable in upper and lower respiratory sp ecimens during the acute  phase of infection. The expected result is Negative. Fact Sheet for Patients:  StrictlyIdeas.no Fact Sheet for Healthcare Providers: BankingDealers.co.za This test is not yet approved or cleared by the [place] FDA and has been authorized for detection and/or diagnosis of SARS-CoV-2 by FDA under an Emergency Use Authorization (EUA).  This EUA will remain in effect (meaning this test can be used) for the duration of the COVID-19 declaration under Section 564(b)(1) of the Act, 21 U.S.C. section 360bbb-3(b)(1), unless the authorization is terminated or revoked sooner. Performed at [facility], [place] [address], [place], [place] [address]   C difficile quick scan w PCR reflex     Status: None   Collection Time: 11/03/18  8:35 AM   Specimen: STOOL  Result Value Ref Range Status   C Diff antigen NEGATIVE NEGATIVE Final   C Diff toxin NEGATIVE NEGATIVE Final   C Diff interpretation No C. difficile detected.  Final    Comment: Performed at [facility], [place] [address] [name], [place] [address]     Time coordinating discharge:  I have spent 35 minutes face to face with the patient and on the ward discussing the patients care, assessment, plan and disposition with other care givers. >50% of the time was devoted counseling the patient about the risks and benefits of treatment/Discharge disposition and  coordinating care.   SIGNED:   Damita Lack, MD  [facility] 11/04/2018, 11:16 AM   If 7PM-7AM, please contact night-coverage [email or web address]

## 2018-11-04 NOTE — Progress Notes (Signed)
HPOA Ms [name] updated that patient is DC`d to go back home today.

## 2018-11-04 NOTE — Progress Notes (Signed)
PT Cancellation Note  Patient Details Name: [name] MRN: [id number] DOB: May 26, 1945   Cancelled Treatment:    Reason Eval/Treat Not Completed: PT screened, no needs identified, will sign off; spoke with RN, no PT needs at this time, pt doing home by [facility] with Hospice services    [facility] 11/04/2018, 1:45 PM

## 2018-11-05 LAB — FOLATE RBC
Folate, Hemolysate: 367 ng/mL
Folate, RBC: 1369 ng/mL (ref >498)
Hematocrit: 26.8 % — ABNORMAL LOW (ref 37.5–51.0)

## 2018-11-06 ENCOUNTER — Telehealth: Payer: Self-pay | Admitting: Oncology

## 2018-11-06 NOTE — Telephone Encounter (Signed)
Called pt per 6/30 sch message - vmail full and unable to reach patient

## 2018-11-07 ENCOUNTER — Other Ambulatory Visit: Payer: Self-pay

## 2018-11-07 ENCOUNTER — Encounter (HOSPITAL_BASED_OUTPATIENT_CLINIC_OR_DEPARTMENT_OTHER): Payer: Medicare Other | Attending: Internal Medicine

## 2018-11-07 DIAGNOSIS — Z87891 Personal history of nicotine dependence: Secondary | ICD-10-CM | POA: Insufficient documentation

## 2018-11-07 DIAGNOSIS — L89529 Pressure ulcer of left ankle, unspecified stage: Secondary | ICD-10-CM | POA: Insufficient documentation

## 2018-11-07 DIAGNOSIS — I129 Hypertensive chronic kidney disease with stage 1 through stage 4 chronic kidney disease, or unspecified chronic kidney disease: Secondary | ICD-10-CM | POA: Diagnosis not present

## 2018-11-07 DIAGNOSIS — N184 Chronic kidney disease, stage 4 (severe): Secondary | ICD-10-CM | POA: Insufficient documentation

## 2018-11-07 DIAGNOSIS — L89154 Pressure ulcer of sacral region, stage 4: Secondary | ICD-10-CM | POA: Insufficient documentation

## 2018-11-07 DIAGNOSIS — J449 Chronic obstructive pulmonary disease, unspecified: Secondary | ICD-10-CM | POA: Diagnosis not present

## 2018-11-07 DIAGNOSIS — L89620 Pressure ulcer of left heel, unstageable: Secondary | ICD-10-CM | POA: Insufficient documentation

## 2018-11-08 ENCOUNTER — Other Ambulatory Visit: Payer: Medicare Other | Admitting: Adult Health Nurse Practitioner

## 2018-11-08 DIAGNOSIS — Z515 Encounter for palliative care: Secondary | ICD-10-CM

## 2018-11-09 ENCOUNTER — Other Ambulatory Visit: Payer: Self-pay
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
## 2018-11-12 ENCOUNTER — Telehealth: Payer: Self-pay

## 2018-11-12 NOTE — Telephone Encounter (Signed)
YOUR CARDIOLOGY TEAM HAS ARRANGED FOR AN E-VISIT FOR YOUR APPOINTMENT - PLEASE REVIEW IMPORTANT INFORMATION BELOW SEVERAL DAYS PRIOR TO YOUR APPOINTMENT  Due to the recent COVID-19 pandemic, we are transitioning in-person office visits to tele-medicine visits in an effort to decrease unnecessary exposure to our patients, their families, and staff. These visits are billed to your insurance just like a normal visit is. We also encourage you to sign up for MyChart if you have not already done so. You will need a smartphone if possible. For patients that do not have this, we can still complete the visit using a regular telephone but do prefer a smartphone to enable video when possible. You may have a family member that lives with you that can help. If possible, we also ask that you have a blood pressure cuff and scale at home to measure your blood pressure, heart rate and weight prior to your scheduled appointment. Patients with clinical needs that need an in-person evaluation and testing will still be able to come to the office if absolutely necessary. If you have any questions, feel free to call our office.     YOUR PROVIDER WILL BE USING THE FOLLOWING PLATFORM TO COMPLETE YOUR VISIT: [email or web address]  . IF USING MYCHART - How to Download the MyChart App to Your SmartPhone   - If Apple, go to App Store and type in MyChart in the search bar and download the app. If Android, ask patient to go to Google Play Store and type in MyChart in the search bar and download the app. The app is free but as with any other app downloads, your phone may require you to verify saved payment information or Apple/Android password.  - You will need to then log into the app with your MyChart username and password, and select [facility] as your healthcare provider to link the account.  - When it is time for your visit, go to the MyChart app, find appointments, and click Begin Video Visit. Be sure to Select Allow for your device to  access the Microphone and Camera for your visit. You will then be connected, and your provider will be with you shortly.  **If you have any issues connecting or need assistance, please contact MyChart service desk [phone] ([phone])**  **If using a computer, in order to ensure the best quality for your visit, you will need to use either of the following Internet Browsers: Google Chrome or Microsoft Edge**  . IF USING DOXIMITY or [email or web address] - The staff will give you instructions on receiving your link to join the meeting the day of your visit.      2-3 DAYS BEFORE YOUR APPOINTMENT  You will receive a telephone call from one of our [facility] team members - your caller ID may say "Unknown caller." If this is a video visit, we will walk you through how to get the video launched on your phone. We will remind you check your blood pressure, heart rate and weight prior to your scheduled appointment. If you have an Apple Watch or Kardia, please upload any pertinent ECG strips the day before or morning of your appointment to MyChart. Our staff will also make sure you have reviewed the consent and agree to move forward with your scheduled tele-health visit.     THE DAY OF YOUR APPOINTMENT  Approximately 15 minutes prior to your scheduled appointment, you will receive a telephone call from one of HeartCare team - your caller ID may say "Unknown caller."    Our staff will confirm medications, vital signs for the day and any symptoms you may be experiencing. Please have this information available prior to the time of visit start. It may also be helpful for you to have a pad of paper and pen handy for any instructions given during your visit. They will also walk you through joining the smartphone meeting if this is a video visit.    CONSENT FOR TELE-HEALTH VISIT - PLEASE REVIEW  I hereby voluntarily request, consent and authorize [facility] and its employed or contracted physicians, physician  assistants, nurse practitioners or other licensed health care professionals (the Practitioner), to provide me with telemedicine health care services (the "Services") as deemed necessary by the treating Practitioner. I acknowledge and consent to receive the Services by the Practitioner via telemedicine. I understand that the telemedicine visit will involve communicating with the Practitioner through live audiovisual communication technology and the disclosure of certain medical information by electronic transmission. I acknowledge that I have been given the opportunity to request an in-person assessment or other available alternative prior to the telemedicine visit and am voluntarily participating in the telemedicine visit.  I understand that I have the right to withhold or withdraw my consent to the use of telemedicine in the course of my care at any time, without affecting my right to future care or treatment, and that the Practitioner or I may terminate the telemedicine visit at any time. I understand that I have the right to inspect all information obtained and/or recorded in the course of the telemedicine visit and may receive copies of available information for a reasonable fee.  I understand that some of the potential risks of receiving the Services via telemedicine include:  . Delay or interruption in medical evaluation due to technological equipment failure or disruption; . Information transmitted may not be sufficient (e.g. poor resolution of images) to allow for appropriate medical decision making by the Practitioner; and/or  . In rare instances, security protocols could fail, causing a breach of personal health information.  Furthermore, I acknowledge that it is my responsibility to provide information about my medical history, conditions and care that is complete and accurate to the best of my ability. I acknowledge that Practitioner's advice, recommendations, and/or decision may be based on  factors not within their control, such as incomplete or inaccurate data provided by me or distortions of diagnostic images or specimens that may result from electronic transmissions. I understand that the practice of medicine is not an exact science and that Practitioner makes no warranties or guarantees regarding treatment outcomes. I acknowledge that I will receive a copy of this consent concurrently upon execution via email to the email address I last provided but may also request a printed copy by calling the office of [facility].    I understand that my insurance will be billed for this visit.   I have read or had this consent read to me. . I understand the contents of this consent, which adequately explains the benefits and risks of the Services being provided via telemedicine.  . I have been provided ample opportunity to ask questions regarding this consent and the Services and have had my questions answered to my satisfaction. . I give my informed consent for the services to be provided through the use of telemedicine in my medical care  By participating in this telemedicine visit I agree to the above.  

## 2018-11-14 ENCOUNTER — Encounter: Payer: Self-pay | Admitting: Acute Care

## 2018-11-15 ENCOUNTER — Inpatient Hospital Stay: Payer: Medicaid Other | Admitting: Oncology

## 2018-11-15 ENCOUNTER — Inpatient Hospital Stay: Payer: Medicaid Other

## 2018-11-16 ENCOUNTER — Telehealth: Payer: Self-pay

## 2018-11-16 ENCOUNTER — Ambulatory Visit
Admission: RE | Admit: 2018-11-16 | Discharge: 2018-11-16 | Disposition: A | Discharge status: Home / Self Care | Payer: Medicare Other | Source: Ambulatory Visit | Attending: Internal Medicine | Admitting: Internal Medicine

## 2018-11-16 ENCOUNTER — Inpatient Hospital Stay: Payer: Medicaid Other | Admitting: Nurse Practitioner

## 2018-11-16 ENCOUNTER — Other Ambulatory Visit: Payer: Self-pay

## 2018-11-16 ENCOUNTER — Other Ambulatory Visit: Payer: Self-pay | Admitting: Family Medicine

## 2018-11-16 ENCOUNTER — Inpatient Hospital Stay: Payer: Medicaid Other | Attending: Nurse Practitioner

## 2018-11-16 DIAGNOSIS — R197 Diarrhea, unspecified: Secondary | ICD-10-CM

## 2018-11-16 NOTE — Telephone Encounter (Signed)
Phone call placed to patient's caregiver, [name], to check in on patient and to offer to schedule follow up with NP. [name] shared that patient just returned from appointment with oncologist.Patient has had diarrhea and MD ordered for patient to have imaging done.  [name] continues to assist with wound care but stated MD office is looking for home health assistance. Visit with Amy NP scheduled for 11/22/2018.

## 2018-11-19 ENCOUNTER — Encounter: Payer: Self-pay | Admitting: Cardiology

## 2018-11-19 ENCOUNTER — Ambulatory Visit: Payer: Self-pay | Admitting: Cardiology

## 2018-11-19 ENCOUNTER — Other Ambulatory Visit: Payer: Self-pay

## 2018-11-19 ENCOUNTER — Telehealth (INDEPENDENT_AMBULATORY_CARE_PROVIDER_SITE_OTHER): Payer: Medicare Other | Admitting: Cardiology

## 2018-11-19 VITALS — Ht 66.0 in | Wt 113.0 lb

## 2018-11-19 DIAGNOSIS — R931 Abnormal findings on diagnostic imaging of heart and coronary circulation: Secondary | ICD-10-CM

## 2018-11-19 DIAGNOSIS — D508 Other iron deficiency anemias: Secondary | ICD-10-CM

## 2018-11-19 DIAGNOSIS — I10 Essential (primary) hypertension: Secondary | ICD-10-CM
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
## 2018-11-19 NOTE — Patient Instructions (Signed)
Medication Instructions:  The current medical regimen is effective;  continue present plan and medications.  If you need a refill on your cardiac medications before your next appointment, please call your pharmacy.   Follow-Up: Follow up as needed with Dr Skains.  Thank you for choosing [facility]!!      

## 2018-11-22 ENCOUNTER — Other Ambulatory Visit: Payer: Self-pay

## 2018-11-22 ENCOUNTER — Other Ambulatory Visit: Payer: Medicare Other | Admitting: Adult Health Nurse Practitioner

## 2018-11-22 DIAGNOSIS — Z515 Encounter for palliative care: Secondary | ICD-10-CM
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
## 2018-12-05 DIAGNOSIS — L89154 Pressure ulcer of sacral region, stage 4: Secondary | ICD-10-CM | POA: Diagnosis not present

## 2018-12-06 ENCOUNTER — Other Ambulatory Visit: Payer: Self-pay

## 2018-12-06 ENCOUNTER — Other Ambulatory Visit: Payer: Medicare Other | Admitting: Adult Health Nurse Practitioner

## 2018-12-06 DIAGNOSIS — Z515 Encounter for palliative care: Secondary | ICD-10-CM
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
## 2018-12-14 ENCOUNTER — Ambulatory Visit (HOSPITAL_COMMUNITY)
Admission: RE | Admit: 2018-12-14 | Discharge: 2018-12-14 | Disposition: A | Discharge status: Home / Self Care | Payer: Medicare Other | Source: Ambulatory Visit | Attending: Cardiovascular Disease | Admitting: Cardiovascular Disease

## 2018-12-14 ENCOUNTER — Other Ambulatory Visit: Payer: Self-pay

## 2018-12-14 ENCOUNTER — Other Ambulatory Visit (HOSPITAL_COMMUNITY): Payer: Self-pay | Admitting: Physician Assistant

## 2018-12-14 DIAGNOSIS — I739 Peripheral vascular disease, unspecified: Secondary | ICD-10-CM | POA: Insufficient documentation

## 2018-12-14 DIAGNOSIS — L89620 Pressure ulcer of left heel, unstageable: Secondary | ICD-10-CM

## 2018-12-19 ENCOUNTER — Encounter (HOSPITAL_BASED_OUTPATIENT_CLINIC_OR_DEPARTMENT_OTHER): Payer: Medicare Other | Attending: Internal Medicine

## 2018-12-19 DIAGNOSIS — L89154 Pressure ulcer of sacral region, stage 4: Secondary | ICD-10-CM | POA: Diagnosis not present

## 2018-12-19 DIAGNOSIS — L89620 Pressure ulcer of left heel, unstageable: Secondary | ICD-10-CM | POA: Diagnosis not present

## 2018-12-19 DIAGNOSIS — I129 Hypertensive chronic kidney disease with stage 1 through stage 4 chronic kidney disease, or unspecified chronic kidney disease: Secondary | ICD-10-CM | POA: Diagnosis not present

## 2018-12-19 DIAGNOSIS — J449 Chronic obstructive pulmonary disease, unspecified: Secondary | ICD-10-CM | POA: Insufficient documentation

## 2018-12-19 DIAGNOSIS — N184 Chronic kidney disease, stage 4 (severe): Secondary | ICD-10-CM | POA: Diagnosis not present

## 2018-12-19 DIAGNOSIS — Z87891 Personal history of nicotine dependence: Secondary | ICD-10-CM | POA: Diagnosis not present

## 2018-12-19 DIAGNOSIS — L89524 Pressure ulcer of left ankle, stage 4: Secondary | ICD-10-CM | POA: Insufficient documentation

## 2018-12-25 ENCOUNTER — Telehealth: Payer: Self-pay

## 2018-12-25 ENCOUNTER — Other Ambulatory Visit: Payer: Self-pay

## 2018-12-25 ENCOUNTER — Ambulatory Visit (INDEPENDENT_AMBULATORY_CARE_PROVIDER_SITE_OTHER): Payer: Medicare Other | Admitting: Cardiovascular Disease

## 2018-12-25 ENCOUNTER — Encounter: Payer: Self-pay | Admitting: Cardiovascular Disease

## 2018-12-25 VITALS — BP 116/70 | HR 77 | Temp 97.9°F | Ht 68.0 in | Wt 113.0 lb

## 2018-12-25 DIAGNOSIS — E785 Hyperlipidemia, unspecified: Secondary | ICD-10-CM

## 2018-12-25 DIAGNOSIS — Z01818 Encounter for other preprocedural examination: Secondary | ICD-10-CM

## 2018-12-25 DIAGNOSIS — I10 Essential (primary) hypertension: Secondary | ICD-10-CM

## 2018-12-25 DIAGNOSIS — I739 Peripheral vascular disease, unspecified: Secondary | ICD-10-CM | POA: Diagnosis not present

## 2018-12-25 LAB — BASIC METABOLIC PANEL
BUN/Creatinine Ratio: 12 (ref 10–24)
BUN: 36 mg/dL — ABNORMAL HIGH (ref 8–27)
CO2: 14 mmol/L — ABNORMAL LOW (ref 20–29)
Calcium: 8.8 mg/dL (ref 8.6–10.2)
Chloride: 116 mmol/L (ref 96–106)
Creatinine, Ser: 3.05 mg/dL — ABNORMAL HIGH (ref 0.76–1.27)
GFR calc Af Amer: 22 mL/min/{1.73_m2} — ABNORMAL LOW (ref >59)
GFR calc non Af Amer: 19 mL/min/{1.73_m2} — ABNORMAL LOW (ref >59)
Glucose: 97 mg/dL (ref 65–99)
Potassium: 3.5 mmol/L (ref 3.5–5.2)
Sodium: 144 mmol/L (ref 134–144)

## 2018-12-25 LAB — CBC
Hematocrit: 19.9 % — ABNORMAL LOW (ref 37.5–51.0)
Hemoglobin: 6.2 g/dL — CL (ref 13.0–17.7)
MCH: 24.6 pg — ABNORMAL LOW (ref 26.6–33.0)
MCHC: 31.2 g/dL — ABNORMAL LOW (ref 31.5–35.7)
MCV: 79 fL (ref 79–97)
Platelets: 194 10*3/uL (ref 150–450)
RBC: 2.52 x10E6/uL — CL (ref 4.14–5.80)
RDW: 18.3 % — ABNORMAL HIGH (ref 11.6–15.4)
WBC: 13.5 10*3/uL — ABNORMAL HIGH (ref 3.4–10.8)

## 2018-12-25 NOTE — Telephone Encounter (Signed)
[name] at [facility] call with critical lab values:  HGB          6.2 RBC          2.52 Chloride 116
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
## 2018-12-25 NOTE — Patient Instructions (Signed)
Medication Instructions:  Your physician recommends that you continue on your current medications as directed. Please refer to the Current Medication list given to you today.  If you need a refill on your cardiac medications before your next appointment, please call your pharmacy.   Testing/Procedures: Your physician has requested that you have a peripheral vascular angiogram. This exam is performed at the hospital. During this exam IV contrast is used to look at arterial blood flow. Please review the information sheet given for details.  Follow-Up: In one month with Dr. Fletcher Anon    Any Other Special Instructions Will Be Listed Below (If Applicable).    [place] [address] Dept: [phone] Loc: [name]  12/25/2018  You are scheduled for a Peripheral Angiogram on Wednesday, August 26 with Dr. Kathlyn Sacramento.  1. Please arrive at the [facility] (Main Entrance A) at [facility]: [address] [place], [place] [address] at 6 am (This time is 4 hours before your procedure for hydration). Free valet parking service is available.   Special note: Every effort is made to have your procedure done on time. Please understand that emergencies sometimes delay scheduled procedures.  2. Diet: Do not eat solid foods after midnight.  The patient may have clear liquids until 5am upon the day of the procedure.  3. Labs: Your provider would like for you to have the following labs today: CBC and BMET  You will need to have the coronavirus test completed prior to your procedure. An appointment has been made at 12:35 on Saturday 12/29/2018. This is a Drive Up Visit at the [facility] [address] Someone will direct you to the appropriate testing line. Stay in your car and someone will be with you shortly. Please make sure to have all other labs  completed before this test because you will need to stay quarantined until your procedure.   4. Medication instructions in preparation for your procedure: Hold the Furosemide the morning of the procedure  On the morning of your procedure, take your Aspirin and any morning medicines NOT listed above.  You may use sips of water.  5. Plan for one night stay--bring personal belongings. 6. Bring a current list of your medications and current insurance cards. 7. You MUST have a responsible person to drive you home. 8. Someone MUST be with you the first 24 hours after you arrive home or your discharge will be delayed. 9. Please wear clothes that are easy to get on and off and wear slip-on shoes.  Thank you for allowing [place] to care for you!   -- [facility]
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
## 2018-12-26 ENCOUNTER — Encounter (HOSPITAL_COMMUNITY): Payer: Self-pay | Admitting: Internal Medicine

## 2018-12-26 ENCOUNTER — Observation Stay (HOSPITAL_COMMUNITY)
Admission: EM | Admit: 2018-12-26 | Discharge: 2018-12-27 | Disposition: A | Discharge status: Home / Self Care | Payer: Medicare Other | Attending: Internal Medicine | Admitting: Internal Medicine

## 2018-12-26 ENCOUNTER — Emergency Department (HOSPITAL_COMMUNITY): Payer: Medicare Other

## 2018-12-26 DIAGNOSIS — Z20828 Contact with and (suspected) exposure to other viral communicable diseases: Secondary | ICD-10-CM | POA: Diagnosis not present

## 2018-12-26 DIAGNOSIS — E872 Acidosis: Secondary | ICD-10-CM | POA: Insufficient documentation

## 2018-12-26 DIAGNOSIS — K861 Other chronic pancreatitis: Secondary | ICD-10-CM | POA: Diagnosis not present

## 2018-12-26 DIAGNOSIS — I252 Old myocardial infarction: Secondary | ICD-10-CM | POA: Insufficient documentation

## 2018-12-26 DIAGNOSIS — M1990 Unspecified osteoarthritis, unspecified site: Secondary | ICD-10-CM | POA: Insufficient documentation

## 2018-12-26 DIAGNOSIS — E876 Hypokalemia: Secondary | ICD-10-CM | POA: Diagnosis not present

## 2018-12-26 DIAGNOSIS — Z79899 Other long term (current) drug therapy: Secondary | ICD-10-CM | POA: Diagnosis not present

## 2018-12-26 DIAGNOSIS — N184 Chronic kidney disease, stage 4 (severe): Secondary | ICD-10-CM | POA: Diagnosis not present

## 2018-12-26 DIAGNOSIS — Z87891 Personal history of nicotine dependence: Secondary | ICD-10-CM | POA: Diagnosis not present

## 2018-12-26 DIAGNOSIS — I700 Atherosclerosis of aorta: Secondary | ICD-10-CM | POA: Insufficient documentation

## 2018-12-26 DIAGNOSIS — K5790 Diverticulosis of intestine, part unspecified, without perforation or abscess without bleeding: Secondary | ICD-10-CM | POA: Insufficient documentation

## 2018-12-26 DIAGNOSIS — M109 Gout, unspecified: Secondary | ICD-10-CM | POA: Diagnosis not present

## 2018-12-26 DIAGNOSIS — F419 Anxiety disorder, unspecified: Secondary | ICD-10-CM | POA: Insufficient documentation

## 2018-12-26 DIAGNOSIS — R55 Syncope and collapse: Secondary | ICD-10-CM | POA: Insufficient documentation

## 2018-12-26 DIAGNOSIS — D509 Iron deficiency anemia, unspecified: Secondary | ICD-10-CM | POA: Diagnosis not present

## 2018-12-26 DIAGNOSIS — I129 Hypertensive chronic kidney disease with stage 1 through stage 4 chronic kidney disease, or unspecified chronic kidney disease: Secondary | ICD-10-CM | POA: Insufficient documentation

## 2018-12-26 DIAGNOSIS — J449 Chronic obstructive pulmonary disease, unspecified: Secondary | ICD-10-CM | POA: Diagnosis not present

## 2018-12-26 DIAGNOSIS — C9210 Chronic myeloid leukemia, BCR/ABL-positive, not having achieved remission: Secondary | ICD-10-CM | POA: Diagnosis not present

## 2018-12-26 DIAGNOSIS — Z7951 Long term (current) use of inhaled steroids: Secondary | ICD-10-CM | POA: Diagnosis not present

## 2018-12-26 DIAGNOSIS — R569 Unspecified convulsions: Secondary | ICD-10-CM | POA: Insufficient documentation

## 2018-12-26 DIAGNOSIS — D649 Anemia, unspecified: Secondary | ICD-10-CM | POA: Diagnosis present

## 2018-12-26 DIAGNOSIS — N179 Acute kidney failure, unspecified: Secondary | ICD-10-CM | POA: Insufficient documentation

## 2018-12-26 LAB — CBC WITH DIFFERENTIAL/PLATELET
Abs Immature Granulocytes: 0.19 10*3/uL — ABNORMAL HIGH (ref 0.00–0.07)
Basophils Absolute: 0 10*3/uL (ref 0.0–0.1)
Basophils Relative: 0 %
Eosinophils Absolute: 0.5 10*3/uL (ref 0.0–0.5)
Eosinophils Relative: 5 %
HCT: 22.1 % — ABNORMAL LOW (ref 39.0–52.0)
Hemoglobin: 6.5 g/dL — CL (ref 13.0–17.0)
Immature Granulocytes: 2 %
Lymphocytes Relative: 8 %
Lymphs Abs: 0.8 10*3/uL (ref 0.7–4.0)
MCH: 25.1 pg — ABNORMAL LOW (ref 26.0–34.0)
MCHC: 29.4 g/dL — ABNORMAL LOW (ref 30.0–36.0)
MCV: 85.3 fL (ref 80.0–100.0)
Monocytes Absolute: 0.6 10*3/uL (ref 0.1–1.0)
Monocytes Relative: 6 %
Neutro Abs: 8.1 10*3/uL — ABNORMAL HIGH (ref 1.7–7.7)
Neutrophils Relative %: 79 %
Platelets: 165 10*3/uL (ref 150–400)
RBC: 2.59 MIL/uL — ABNORMAL LOW (ref 4.22–5.81)
RDW: 19.5 % — ABNORMAL HIGH (ref 11.5–15.5)
WBC: 10.3 10*3/uL (ref 4.0–10.5)
nRBC: 0.2 % (ref 0.0–0.2)

## 2018-12-26 LAB — COMPREHENSIVE METABOLIC PANEL
ALT: 27 U/L (ref 0–44)
AST: 22 U/L (ref 15–41)
Albumin: 1.9 g/dL — ABNORMAL LOW (ref 3.5–5.0)
Alkaline Phosphatase: 81 U/L (ref 38–126)
Anion gap: 9 (ref 5–15)
BUN: 37 mg/dL — ABNORMAL HIGH (ref 8–23)
CO2: 14 mmol/L — ABNORMAL LOW (ref 22–32)
Calcium: 8.6 mg/dL — ABNORMAL LOW (ref 8.9–10.3)
Chloride: 120 mmol/L — ABNORMAL HIGH (ref 98–111)
Creatinine, Ser: 3.18 mg/dL — ABNORMAL HIGH (ref 0.61–1.24)
GFR calc Af Amer: 21 mL/min — ABNORMAL LOW (ref >60)
GFR calc non Af Amer: 18 mL/min — ABNORMAL LOW (ref >60)
Glucose, Bld: 118 mg/dL — ABNORMAL HIGH (ref 70–99)
Potassium: 3.2 mmol/L — ABNORMAL LOW (ref 3.5–5.1)
Sodium: 143 mmol/L (ref 135–145)
Total Bilirubin: 0.5 mg/dL (ref 0.3–1.2)
Total Protein: 5.9 g/dL — ABNORMAL LOW (ref 6.5–8.1)

## 2018-12-26 LAB — CBG MONITORING, ED: Glucose-Capillary: 109 mg/dL — ABNORMAL HIGH (ref 70–99)

## 2018-12-26 LAB — PREPARE RBC (CROSSMATCH)

## 2018-12-26 LAB — SARS CORONAVIRUS 2 BY RT PCR (HOSPITAL ORDER, PERFORMED IN ~~LOC~~ HOSPITAL LAB): SARS Coronavirus 2: NEGATIVE

## 2018-12-26 MED ORDER — ACETAMINOPHEN 325 MG PO TABS: 650.0000 mg | ORAL_TABLET | ORAL | Status: DC | PRN

## 2018-12-26 MED ORDER — CLONIDINE HCL 0.2 MG PO TABS
0.2000 mg | ORAL_TABLET | Freq: Three times a day (TID) | ORAL | Status: DC
  Administered 2018-12-26 – 2018-12-27 (×2): 0.2 mg via ORAL
  Filled 2018-12-26 (×2): qty 1

## 2018-12-26 MED ORDER — GABAPENTIN 100 MG PO CAPS
100.0000 mg | ORAL_CAPSULE | Freq: Two times a day (BID) | ORAL | Status: DC
  Administered 2018-12-27: 100 mg via ORAL
  Filled 2018-12-26 (×2): qty 1

## 2018-12-26 MED ORDER — HYDROCODONE-ACETAMINOPHEN 5-325 MG PO TABS
1.0000 | ORAL_TABLET | Freq: Two times a day (BID) | ORAL | Status: DC | PRN
  Administered 2018-12-27: 1 via ORAL
  Filled 2018-12-26: qty 1

## 2018-12-26 MED ORDER — ENOXAPARIN SODIUM 30 MG/0.3ML ~~LOC~~ SOLN
30.0000 mg | SUBCUTANEOUS | Status: DC
  Administered 2018-12-26: 30 mg via SUBCUTANEOUS
  Filled 2018-12-26: qty 0.3

## 2018-12-26 MED ORDER — MEGESTROL ACETATE 40 MG PO TABS
40.0000 mg | ORAL_TABLET | Freq: Every day | ORAL | Status: DC
  Administered 2018-12-27: 40 mg via ORAL
  Filled 2018-12-26: qty 1

## 2018-12-26 MED ORDER — NICOTINE 21 MG/24HR TD PT24
21.0000 mg | MEDICATED_PATCH | TRANSDERMAL | Status: DC | PRN
  Administered 2018-12-27: 21 mg via TRANSDERMAL
  Filled 2018-12-26: qty 1

## 2018-12-26 MED ORDER — SIMVASTATIN 20 MG PO TABS
40.0000 mg | ORAL_TABLET | Freq: Every day | ORAL | Status: DC
  Administered 2018-12-27: 40 mg via ORAL
  Filled 2018-12-26: qty 2

## 2018-12-26 MED ORDER — GABAPENTIN 300 MG PO CAPS
300.0000 mg | ORAL_CAPSULE | Freq: Every day | ORAL | Status: DC
  Administered 2018-12-26: 22:00:00 300 mg via ORAL
  Filled 2018-12-26: qty 1

## 2018-12-26 MED ORDER — SODIUM CHLORIDE 0.9 % IV BOLUS: 500.0000 mL | Freq: Once | INTRAVENOUS | Status: DC

## 2018-12-26 MED ORDER — COLCHICINE 0.6 MG PO TABS: 0.6000 mg | ORAL_TABLET | Freq: Two times a day (BID) | ORAL | Status: DC | PRN

## 2018-12-26 MED ORDER — POTASSIUM CHLORIDE 20 MEQ PO PACK
40.0000 meq | PACK | Freq: Once | ORAL | Status: DC
  Filled 2018-12-26: qty 2

## 2018-12-26 MED ORDER — LEVETIRACETAM 500 MG PO TABS
500.0000 mg | ORAL_TABLET | Freq: Two times a day (BID) | ORAL | Status: DC
  Administered 2018-12-26 – 2018-12-27 (×2): 500 mg via ORAL
  Filled 2018-12-26 (×2): qty 1

## 2018-12-26 MED ORDER — SODIUM CHLORIDE 0.9% IV SOLUTION: Freq: Once | INTRAVENOUS | Status: DC

## 2018-12-26 NOTE — ED Triage Notes (Signed)
Pt arrived to ED by pov with a caregiver whom stated they were called to come in due to abnormal labs. Upon pt standing to get into wheelchair he appeared to have syncope episode where he was not responding. Pt arrived to my room in wheelchair slumped over. Upon laying pt flat he opened his eyes and was able to answer questions and was oriented.  Pt had loose stool and stated he has been having diarrhea for the last few days. Pt also has a sacrum pressure ulcer and a wound on left foot. 

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
## 2018-12-26 NOTE — ED Notes (Signed)
Edp notified of critical Hgb.

## 2018-12-26 NOTE — Progress Notes (Signed)
Wound Care Note Wound type:Sacral wound Pressure Injury POA: Stage 3-4  Measurement: 4cm x 4cm with 1.75cm tunnleing 1.25cm depth Wound bed: Red  Dressing procedure/placement/frequency: Placed Xeroform with sacral foam  Wound type: Left heel Pressure injury to lateral talus and calcaneus Right side is necrotic non-stagable 3cm Left ankle bone is 1cm unstagable Cleansed and placed xeroform and wrapped with kerlex Rosalio Loud, MSN, RN, CMSRN, AGCNS

## 2018-12-26 NOTE — Telephone Encounter (Signed)
Will forward to Dr. Fletcher Anon.  Candee Furbish, MD
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
## 2018-12-26 NOTE — Telephone Encounter (Signed)
Call placed to the legal guardian, Ms. Glenford Peers, to inform her of the critical lab for the patient: Hemoglobin 6.2. She has been advised to take the patient to the ED for a blood transfusion.   She has verbalized her understanding and will take him. The [facility] ED has been called and notified that the patient will be coming.

## 2018-12-27 ENCOUNTER — Telehealth: Payer: Self-pay | Admitting: Adult Health Nurse Practitioner

## 2018-12-27 ENCOUNTER — Telehealth: Payer: Self-pay | Admitting: Cardiovascular Disease

## 2018-12-27 ENCOUNTER — Telehealth: Payer: Self-pay

## 2018-12-27 DIAGNOSIS — N179 Acute kidney failure, unspecified: Secondary | ICD-10-CM

## 2018-12-27 DIAGNOSIS — K5790 Diverticulosis of intestine, part unspecified, without perforation or abscess without bleeding: Secondary | ICD-10-CM

## 2018-12-27 DIAGNOSIS — N183 Chronic kidney disease, stage 3 (moderate): Secondary | ICD-10-CM

## 2018-12-27 DIAGNOSIS — Z87891 Personal history of nicotine dependence: Secondary | ICD-10-CM

## 2018-12-27 DIAGNOSIS — Z79899 Other long term (current) drug therapy: Secondary | ICD-10-CM

## 2018-12-27 DIAGNOSIS — Z9889 Other specified postprocedural states: Secondary | ICD-10-CM

## 2018-12-27 DIAGNOSIS — L89153 Pressure ulcer of sacral region, stage 3: Secondary | ICD-10-CM

## 2018-12-27 DIAGNOSIS — J449 Chronic obstructive pulmonary disease, unspecified: Secondary | ICD-10-CM

## 2018-12-27 DIAGNOSIS — Z91041 Radiographic dye allergy status: Secondary | ICD-10-CM

## 2018-12-27 DIAGNOSIS — D649 Anemia, unspecified: Secondary | ICD-10-CM

## 2018-12-27 DIAGNOSIS — K649 Unspecified hemorrhoids: Secondary | ICD-10-CM

## 2018-12-27 DIAGNOSIS — L89623 Pressure ulcer of left heel, stage 3: Secondary | ICD-10-CM

## 2018-12-27 DIAGNOSIS — I129 Hypertensive chronic kidney disease with stage 1 through stage 4 chronic kidney disease, or unspecified chronic kidney disease: Secondary | ICD-10-CM

## 2018-12-27 DIAGNOSIS — C9210 Chronic myeloid leukemia, BCR/ABL-positive, not having achieved remission: Secondary | ICD-10-CM | POA: Diagnosis not present

## 2018-12-27 DIAGNOSIS — K859 Acute pancreatitis without necrosis or infection, unspecified: Secondary | ICD-10-CM

## 2018-12-27 DIAGNOSIS — E876 Hypokalemia: Secondary | ICD-10-CM

## 2018-12-27 DIAGNOSIS — D509 Iron deficiency anemia, unspecified: Secondary | ICD-10-CM | POA: Diagnosis not present

## 2018-12-27 DIAGNOSIS — G40909 Epilepsy, unspecified, not intractable, without status epilepticus: Secondary | ICD-10-CM

## 2018-12-27 LAB — BPAM RBC
Blood Product Expiration Date: 202009152359
Blood Product Expiration Date: 202009162359
ISSUE DATE / TIME: 202008191502
ISSUE DATE / TIME: 202008192017
Unit Type and Rh: 5100
Unit Type and Rh: 5100

## 2018-12-27 LAB — BASIC METABOLIC PANEL
Anion gap: 11 (ref 5–15)
BUN: 35 mg/dL — ABNORMAL HIGH (ref 8–23)
CO2: 13 mmol/L — ABNORMAL LOW (ref 22–32)
Calcium: 8.5 mg/dL — ABNORMAL LOW (ref 8.9–10.3)
Chloride: 118 mmol/L — ABNORMAL HIGH (ref 98–111)
Creatinine, Ser: 3.02 mg/dL — ABNORMAL HIGH (ref 0.61–1.24)
GFR calc Af Amer: 23 mL/min — ABNORMAL LOW (ref >60)
GFR calc non Af Amer: 20 mL/min — ABNORMAL LOW (ref >60)
Glucose, Bld: 87 mg/dL (ref 70–99)
Potassium: 3 mmol/L — ABNORMAL LOW (ref 3.5–5.1)
Sodium: 142 mmol/L (ref 135–145)

## 2018-12-27 LAB — CBC
HCT: 29.7 % — ABNORMAL LOW (ref 39.0–52.0)
Hemoglobin: 9.3 g/dL — ABNORMAL LOW (ref 13.0–17.0)
MCH: 26.6 pg (ref 26.0–34.0)
MCHC: 31.3 g/dL (ref 30.0–36.0)
MCV: 84.9 fL (ref 80.0–100.0)
Platelets: 152 10*3/uL (ref 150–400)
RBC: 3.5 MIL/uL — ABNORMAL LOW (ref 4.22–5.81)
RDW: 17.2 % — ABNORMAL HIGH (ref 11.5–15.5)
WBC: 12.7 10*3/uL — ABNORMAL HIGH (ref 4.0–10.5)
nRBC: 0 % (ref 0.0–0.2)

## 2018-12-27 LAB — TYPE AND SCREEN
ABO/RH(D): O POS
Antibody Screen: NEGATIVE
Unit division: 0
Unit division: 0

## 2018-12-27 MED ORDER — POTASSIUM CHLORIDE 20 MEQ PO PACK
40.0000 meq | PACK | Freq: Two times a day (BID) | ORAL | Status: DC
  Administered 2018-12-27: 40 meq via ORAL
  Filled 2018-12-27 (×2): qty 2

## 2018-12-27 NOTE — Telephone Encounter (Signed)
Spoke with [name] and informed her that nurse consulted with supervisor and Covid test only valid for 4 days prior to procedure and since pt procedure is on 8/26, he will still need to get COVID test this Saturday. She verbalized understanding

## 2018-12-27 NOTE — Progress Notes (Signed)
PT Cancellation Note  Patient Details Name: [name] MRN: [id number] DOB: 1945-05-23   Cancelled Treatment:    Reason Eval/Treat Not Completed: Fatigue/lethargy limiting ability to participate(pt sleeping soundly, difficult to arouse, did briefly open eyes and state he was too tired to do PT right now. Will follow.)  Philomena Doheny PT 12/27/2018  Acute Rehabilitation Services Pager [phone] Office [phone]

## 2018-12-27 NOTE — Telephone Encounter (Signed)
Received a message to call Dr. Tarri Abernethy as patient is discharging from hospital today. Phone call returned to MD who wanted to verify that follow up Palliative Care visit has already been rescheduled.

## 2018-12-27 NOTE — Consult Note (Signed)
[place] Nurse wound consult note Patient receiving care in [facility] [phone].  Consult completed remotely via review of record, including image of sacral wound.  I have sent a SecureChat message to Dr. Angelia Mould asking for a photo of the left heel wound be placed in the record. Reason for Consult: Sacral and left heel wounds Wound type: Sacral stage 3 PI, 100% pink.  Left heel wound characteristics in flowsheet represent eschar and an unstageable wound. Pressure Injury POA: Yes Measurement: see flowsheet details Wound bed: as above Drainage (amount, consistency, odor) none of record Periwound: see flowsheet details and photo Dressing procedure/placement/frequency: For the sacrum: Place a xeroform gauze Kellie Simmering [phone]) into the sacral wound.  Cover with a foam dressing.  Change the Xeroform gauze daily.  The foam dressing can remain in place up to 3 days.  For the left heel wound Xeroform over the area, secure with kerlex. Change every 2 days. Monitor the wound area(s) for worsening of condition such as: Signs/symptoms of infection,  Increase in size,  Development of or worsening of odor, Development of pain, or increased pain at the affected locations.  Notify the medical team if any of these develop.  Thank you for the consult.  [place] nurse will not follow at this time.  Please re-consult the [place] team if needed.  Val Riles, RN, MSN, CWOCN, CNS-BC, pager [phone]

## 2018-12-27 NOTE — Telephone Encounter (Signed)
Returned patient's caregiver call from this morning.  Patient in hospital and had to cancel today's visit.  Rescheduled for 8/27 at 11:30 Amy K. Olena Heckle NP
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
## 2018-12-27 NOTE — Progress Notes (Signed)
Patient was discharged home by MD order; discharged instructions  review and give to patient/significant other with care notes; IV DIC; Dressing changed on left heel and sacrum; patient will be escorted to the car by nurse tech via wheelchair.

## 2018-12-27 NOTE — Progress Notes (Signed)
   Subjective:  [name] was seen at bedside today, while he was eating breakfast. He states that he did not sleep well last night because, "of things." He states that he is having difficulty swallowing, but during the interview he was able to eat and drink without trouble. We spoke to him about his low hemoglobin and his management. All questions and concerns were addressed.   Objective:  Vital signs in last 24 hours: Vitals:   12/27/18 0430 12/27/18 0730 12/27/18 1021 12/27/18 1228  BP: (!) 165/88 (!) 165/86 (!) 145/95 (!) 151/88  Pulse: 64 69 63 63  Resp: 18 18  17   Temp: 98.3 F (36.8 C) 98 F (36.7 C)  98.1 F (36.7 C)  TempSrc: Oral Axillary  Oral  SpO2: 99% 100%  100%  Weight:      Height:       Physical Exam Constitutional:      General: He is not in acute distress.    Appearance: Normal appearance. He is not ill-appearing, toxic-appearing or diaphoretic.  HENT:     Head: Normocephalic and atraumatic.  Eyes:     Comments: Conjunctive pallor noted bilaterally.   Cardiovascular:     Rate and Rhythm: Normal rate and regular rhythm.     Heart sounds: No murmur. No gallop.   Pulmonary:     Effort: Pulmonary effort is normal. No respiratory distress.     Breath sounds: Normal breath sounds. No stridor. No wheezing.  Abdominal:     General: Abdomen is flat. Bowel sounds are normal.  Skin:    General: Skin is warm.     Coloration: Skin is not jaundiced.     Findings: No bruising or erythema.     Assessment/Plan:  Active Problems:   Symptomatic anemia  Symptomatic Anemia:  - Hgb: 9.3 after transfusion. This is an appropriate response to receiving 2 units of blood.    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 - Continue to monitor.  Hypertension:  - Continue Clonidine - Continue  Simvastatin  Hypokalemia:  - K:3.0 - Potassium Sulfate 40 mg packet BID ordered.   CML:  - Continue Megace 40 mg.  Dispo: Anticipated discharge today.   Maudie Mercury, MD 12/27/2018, 1:13 PM Pager: [phone]

## 2018-12-27 NOTE — Telephone Encounter (Signed)
  Patient had to go to hospital for blood transfusion and they did a covid screening test on him yesterday (12/26/18) there that came out negative. Does he still need to go Saturday for his screeening?

## 2018-12-27 NOTE — Progress Notes (Signed)
Dressings to sacrum and left heel changed per order.  Will continue to monitor.

## 2018-12-29 ENCOUNTER — Other Ambulatory Visit (HOSPITAL_COMMUNITY)
Admission: RE | Admit: 2018-12-29 | Discharge: 2018-12-29 | Disposition: A | Discharge status: Home / Self Care | Payer: Medicare Other | Source: Ambulatory Visit | Attending: Cardiovascular Disease | Admitting: Cardiovascular Disease

## 2018-12-29 DIAGNOSIS — Z01812 Encounter for preprocedural laboratory examination: Secondary | ICD-10-CM | POA: Diagnosis not present

## 2018-12-29 DIAGNOSIS — Z20828 Contact with and (suspected) exposure to other viral communicable diseases: Secondary | ICD-10-CM | POA: Diagnosis not present

## 2018-12-30 LAB — SARS CORONAVIRUS 2 (TAT 6-24 HRS): SARS Coronavirus 2: NEGATIVE

## 2019-01-01 ENCOUNTER — Telehealth: Payer: Self-pay | Admitting: *Deleted

## 2019-01-01 MED ORDER — PREDNISONE 50 MG PO TABS: ORAL_TABLET | ORAL | 0 refills | Status: DC

## 2019-01-01 NOTE — Telephone Encounter (Signed)
Pt contacted pre-abdominal aortagram scheduled at [facility] for: Wednesday January 02, 2019 10:30 AM Verified arrival time and place: [facility]) at: 6 AM-pre procedure hydration   No solid food after midnight prior to cath, clear liquids until 5 AM day of procedure.  Contrast allergy: yes-13 hour Prednisone and Benadryl Prep reviewed with [name] [facility])  Prednisone 50 mg 9:30 PM 01/01/19  Prednisone 50 mg 3:30 AM 01/02/19 Prednisone 50 mg and Benadryl 50 mg-pt to take with him to hospital take 8:30 AM at hospital.   Hold: Lasix-day before and day of procedure.  Except hold medications AM meds can be  taken pre-cath with sip of water including: ASA 81 mg   Confirmed patient has responsible person to drive home post procedure and observe 24 hours after arriving home: yes  Currently, due to Covid-19 pandemic, only one support person will be allowed with patient. Must be the same support person for that patient's entire stay, will be screened and required to wear a mask. They will be asked to wait in the waiting room for the duration of the patient's stay.  Patients are required to wear a mask when they enter the hospital.       COVID-19 Pre-Screening Questions:  . In the past 7 to 10 days have you had a cough,  shortness of breath, headache, congestion, fever (100 or greater) body aches, chills, sore throat, or sudden loss of taste or sense of smell? no . Have you been around anyone with known Covid 19? no . Have you been around anyone who is awaiting Covid 19 test results in the past 7 to 10 days? no . Have you been around anyone who has been exposed to Covid 19, or has mentioned symptoms of Covid 19 within the past 7 to 10 days? no  I reviewed procedure/mask/visitor instructions, Covid-19 screening questions with [name], she verbalized understanding, thanked me for call.

## 2019-01-02 ENCOUNTER — Ambulatory Visit (HOSPITAL_COMMUNITY)
Admission: RE | Admit: 2019-01-02 | Discharge: 2019-01-02 | Disposition: A | Discharge status: Home / Self Care | Payer: Medicare Other | Attending: Cardiovascular Disease | Admitting: Cardiovascular Disease

## 2019-01-02 ENCOUNTER — Encounter (HOSPITAL_COMMUNITY)
Admission: RE | Disposition: A | Discharge status: Home / Self Care | Payer: Self-pay | Source: Home / Self Care | Attending: Cardiovascular Disease

## 2019-01-02 ENCOUNTER — Other Ambulatory Visit: Payer: Self-pay

## 2019-01-02 DIAGNOSIS — M109 Gout, unspecified: Secondary | ICD-10-CM | POA: Diagnosis not present

## 2019-01-02 DIAGNOSIS — I129 Hypertensive chronic kidney disease with stage 1 through stage 4 chronic kidney disease, or unspecified chronic kidney disease: Secondary | ICD-10-CM | POA: Insufficient documentation

## 2019-01-02 DIAGNOSIS — M1990 Unspecified osteoarthritis, unspecified site: Secondary | ICD-10-CM | POA: Insufficient documentation

## 2019-01-02 DIAGNOSIS — L97529 Non-pressure chronic ulcer of other part of left foot with unspecified severity: Secondary | ICD-10-CM | POA: Diagnosis not present

## 2019-01-02 DIAGNOSIS — N189 Chronic kidney disease, unspecified: Secondary | ICD-10-CM | POA: Diagnosis not present

## 2019-01-02 DIAGNOSIS — I2510 Atherosclerotic heart disease of native coronary artery without angina pectoris: Secondary | ICD-10-CM | POA: Diagnosis not present

## 2019-01-02 DIAGNOSIS — Z888 Allergy status to other drugs, medicaments and biological substances status: Secondary | ICD-10-CM | POA: Insufficient documentation

## 2019-01-02 DIAGNOSIS — D649 Anemia, unspecified: Secondary | ICD-10-CM | POA: Diagnosis not present

## 2019-01-02 DIAGNOSIS — E785 Hyperlipidemia, unspecified: Secondary | ICD-10-CM | POA: Insufficient documentation

## 2019-01-02 DIAGNOSIS — S91302A Unspecified open wound, left foot, initial encounter: Secondary | ICD-10-CM | POA: Diagnosis not present

## 2019-01-02 DIAGNOSIS — I70212 Atherosclerosis of native arteries of extremities with intermittent claudication, left leg: Secondary | ICD-10-CM | POA: Diagnosis not present

## 2019-01-02 DIAGNOSIS — I745 Embolism and thrombosis of iliac artery: Secondary | ICD-10-CM | POA: Diagnosis not present

## 2019-01-02 DIAGNOSIS — Z89621 Acquired absence of right hip joint: Secondary | ICD-10-CM | POA: Insufficient documentation

## 2019-01-02 DIAGNOSIS — Z7951 Long term (current) use of inhaled steroids: Secondary | ICD-10-CM | POA: Insufficient documentation

## 2019-01-02 DIAGNOSIS — Z87891 Personal history of nicotine dependence: Secondary | ICD-10-CM | POA: Insufficient documentation

## 2019-01-02 DIAGNOSIS — Z8249 Family history of ischemic heart disease and other diseases of the circulatory system: Secondary | ICD-10-CM | POA: Insufficient documentation

## 2019-01-02 DIAGNOSIS — I252 Old myocardial infarction: Secondary | ICD-10-CM | POA: Diagnosis not present

## 2019-01-02 DIAGNOSIS — Z79899 Other long term (current) drug therapy: Secondary | ICD-10-CM | POA: Insufficient documentation

## 2019-01-02 DIAGNOSIS — J449 Chronic obstructive pulmonary disease, unspecified: Secondary | ICD-10-CM | POA: Diagnosis not present

## 2019-01-02 DIAGNOSIS — Z87828 Personal history of other (healed) physical injury and trauma: Secondary | ICD-10-CM | POA: Diagnosis not present

## 2019-01-02 DIAGNOSIS — I70245 Atherosclerosis of native arteries of left leg with ulceration of other part of foot: Secondary | ICD-10-CM | POA: Insufficient documentation

## 2019-01-02 HISTORY — PX: ABDOMINAL AORTOGRAM: CATH118222

## 2019-01-02 HISTORY — PX: LOWER EXTREMITY ANGIOGRAPHY: CATH118251

## 2019-01-02 LAB — CBC
HCT: 33.6 % — ABNORMAL LOW (ref 39.0–52.0)
Hemoglobin: 10.3 g/dL — ABNORMAL LOW (ref 13.0–17.0)
MCH: 26.6 pg (ref 26.0–34.0)
MCHC: 30.7 g/dL (ref 30.0–36.0)
MCV: 86.8 fL (ref 80.0–100.0)
Platelets: 164 10*3/uL (ref 150–400)
RBC: 3.87 MIL/uL — ABNORMAL LOW (ref 4.22–5.81)
RDW: 18.1 % — ABNORMAL HIGH (ref 11.5–15.5)
WBC: 10.9 10*3/uL — ABNORMAL HIGH (ref 4.0–10.5)
nRBC: 0 % (ref 0.0–0.2)

## 2019-01-02 LAB — BASIC METABOLIC PANEL
Anion gap: 9 (ref 5–15)
BUN: 45 mg/dL — ABNORMAL HIGH (ref 8–23)
CO2: 15 mmol/L — ABNORMAL LOW (ref 22–32)
Calcium: 8.6 mg/dL — ABNORMAL LOW (ref 8.9–10.3)
Chloride: 116 mmol/L — ABNORMAL HIGH (ref 98–111)
Creatinine, Ser: 3.22 mg/dL — ABNORMAL HIGH (ref 0.61–1.24)
GFR calc Af Amer: 21 mL/min — ABNORMAL LOW (ref >60)
GFR calc non Af Amer: 18 mL/min — ABNORMAL LOW (ref >60)
Glucose, Bld: 127 mg/dL — ABNORMAL HIGH (ref 70–99)
Potassium: 4.7 mmol/L (ref 3.5–5.1)
Sodium: 140 mmol/L (ref 135–145)

## 2019-01-02 LAB — POCT ACTIVATED CLOTTING TIME: Activated Clotting Time: 136 seconds

## 2019-01-02 SURGERY — ABDOMINAL AORTOGRAM
Anesthesia: LOCAL

## 2019-01-02 MED ORDER — LABETALOL HCL 5 MG/ML IV SOLN
10.0000 mg | INTRAVENOUS | Status: DC | PRN
  Administered 2019-01-02: 12:00:00 10 mg via INTRAVENOUS

## 2019-01-02 MED ORDER — LIDOCAINE HCL (PF) 1 % IJ SOLN
INTRAMUSCULAR | Status: AC
  Filled 2019-01-02: qty 30

## 2019-01-02 MED ORDER — HEPARIN (PORCINE) IN NACL 1000-0.9 UT/500ML-% IV SOLN
INTRAVENOUS | Status: DC | PRN
  Administered 2019-01-02 (×2): 500 mL

## 2019-01-02 MED ORDER — ASPIRIN 81 MG PO CHEW: 81.0000 mg | CHEWABLE_TABLET | ORAL | Status: DC

## 2019-01-02 MED ORDER — HEPARIN (PORCINE) IN NACL 1000-0.9 UT/500ML-% IV SOLN
INTRAVENOUS | Status: AC
  Filled 2019-01-02: qty 1000

## 2019-01-02 MED ORDER — NITROGLYCERIN 1 MG/10 ML FOR IR/CATH LAB
INTRA_ARTERIAL | Status: DC | PRN
  Administered 2019-01-02: 200 ug via INTRA_ARTERIAL

## 2019-01-02 MED ORDER — SODIUM CHLORIDE 0.9% FLUSH: 3.0000 mL | INTRAVENOUS | Status: DC | PRN

## 2019-01-02 MED ORDER — SODIUM CHLORIDE 0.9 % WEIGHT BASED INFUSION
3.0000 mL/kg/h | INTRAVENOUS | Status: AC
  Administered 2019-01-02: 3 mL/kg/h via INTRAVENOUS

## 2019-01-02 MED ORDER — ACETAMINOPHEN 325 MG PO TABS: 650.0000 mg | ORAL_TABLET | ORAL | Status: DC | PRN

## 2019-01-02 MED ORDER — SODIUM CHLORIDE 0.9 % IV SOLN: 250.0000 mL | INTRAVENOUS | Status: DC | PRN

## 2019-01-02 MED ORDER — ONDANSETRON HCL 4 MG/2ML IJ SOLN: 4.0000 mg | Freq: Four times a day (QID) | INTRAMUSCULAR | Status: DC | PRN

## 2019-01-02 MED ORDER — MIDAZOLAM HCL 2 MG/2ML IJ SOLN
INTRAMUSCULAR | Status: DC | PRN
  Administered 2019-01-02: 1 mg via INTRAVENOUS

## 2019-01-02 MED ORDER — HYDRALAZINE HCL 20 MG/ML IJ SOLN
5.0000 mg | INTRAMUSCULAR | Status: DC | PRN
  Administered 2019-01-02: 13:00:00 5 mg via INTRAVENOUS

## 2019-01-02 MED ORDER — LABETALOL HCL 5 MG/ML IV SOLN
INTRAVENOUS | Status: AC
  Filled 2019-01-02: qty 4

## 2019-01-02 MED ORDER — DIPHENHYDRAMINE HCL 50 MG/ML IJ SOLN
INTRAMUSCULAR | Status: AC
  Filled 2019-01-02: qty 1

## 2019-01-02 MED ORDER — SODIUM CHLORIDE 0.9 % WEIGHT BASED INFUSION: 1.0000 mL/kg/h | INTRAVENOUS | Status: DC

## 2019-01-02 MED ORDER — HEPARIN SODIUM (PORCINE) 1000 UNIT/ML IJ SOLN
INTRAMUSCULAR | Status: DC | PRN
  Administered 2019-01-02: 2000 [IU] via INTRAVENOUS

## 2019-01-02 MED ORDER — MIDAZOLAM HCL 2 MG/2ML IJ SOLN
INTRAMUSCULAR | Status: AC
  Filled 2019-01-02: qty 2

## 2019-01-02 MED ORDER — DIPHENHYDRAMINE HCL 50 MG/ML IJ SOLN
INTRAMUSCULAR | Status: DC | PRN
  Administered 2019-01-02: 50 mg via INTRAVENOUS

## 2019-01-02 MED ORDER — SODIUM CHLORIDE 0.9% FLUSH: 3.0000 mL | Freq: Two times a day (BID) | INTRAVENOUS | Status: DC

## 2019-01-02 MED ORDER — HYDRALAZINE HCL 20 MG/ML IJ SOLN
INTRAMUSCULAR | Status: AC
  Filled 2019-01-02: qty 1

## 2019-01-02 SURGICAL SUPPLY — 15 items
CATH ANGIO 5F PIGTAIL 100CM (CATHETERS) ×1 IMPLANT
CATH INFINITI VERT 5FR 125CM (CATHETERS) ×1 IMPLANT
FILTER CO2 0.2 MICRON (VASCULAR PRODUCTS) ×1 IMPLANT
GLIDESHEATH SLEND SS 6F .021 (SHEATH) ×1 IMPLANT
KIT MICROPUNCTURE NIT STIFF (SHEATH) ×1 IMPLANT
KIT PV (KITS) ×3 IMPLANT
RESERVOIR CO2 (VASCULAR PRODUCTS) ×1 IMPLANT
SET FLUSH CO2 (MISCELLANEOUS) ×1 IMPLANT
SHEATH PINNACLE 5F 10CM (SHEATH) ×1 IMPLANT
SHEATH PROBE COVER 6X72 (BAG) ×1 IMPLANT
SYR MEDRAD MARK 7 150ML (SYRINGE) ×3 IMPLANT
TRANSDUCER W/STOPCOCK (MISCELLANEOUS) ×3 IMPLANT
TRAY PV CATH (CUSTOM PROCEDURE TRAY) ×3 IMPLANT
WIRE BENTSON .035X145CM (WIRE) ×1 IMPLANT
WIRE HI TORQ VERSACORE J 260CM (WIRE) ×1 IMPLANT

## 2019-01-02 NOTE — Progress Notes (Signed)
Order for sheath removal verified per post procedural orders. Procedure explained to patient and brachial artery access site assessed: level 0, palpable dorsalis pedis and posterior tibial pulses. 6 French Sheath removed and manual pressure applied for 20 minutes. Pre, peri, & post procedural vitals: HR 70, RR 12, O2 Sat 100%, BP 162/98, Pain level 0. Distal pulses remained intact after sheath removal. Access site level 0 and dressed with 4X4 gauze and tegaderm. Mickel Baas RN confirmed condition of site. Post procedural instructions discussed with return demonstration from patient.

## 2019-01-02 NOTE — Discharge Instructions (Signed)
Angiogram, Care After This sheet gives you information about how to care for yourself after your procedure. Your health care provider may also give you more specific instructions. If you have problems or questions, contact your health care provider. What can I expect after the procedure? After the procedure, it is common to have bruising and tenderness at the catheter insertion area. Follow these instructions at home: Insertion site care  Follow instructions from your health care provider about how to take care of your insertion site. Make sure you: ? Wash your hands with soap and water before you change your bandage (dressing). If soap and water are not available, use hand sanitizer. ? Change your dressing as told by your health care provider.  Do not take baths, swim, or use a hot tub until your health care provider approves.  You may shower 24-48 hours after the procedure or as told by your health care provider. ? Gently wash the site with plain soap and water. ? Pat the area dry with a clean towel. ? Do not rub the site. This may cause bleeding.  Do not apply powder or lotion to the site. Keep the site clean and dry.  Check your insertion site every day for signs of infection. Check for: ? Redness, swelling, or pain. ? Fluid or blood. ? Warmth. ? Pus or a bad smell. Activity  Rest as told by your health care provider, usually for 1-2 days.  Do not lift anything that is heavier than 10 lbs for 5 days.  Do not drive for 24 hours if you were given a medicine to help you relax (sedative).  Do not drive or use heavy machinery while taking prescription pain medicine. General instructions   Return to your normal activities as told by your health care provider, usually in about a week. Ask your health care provider what activities are safe for you.  If the catheter site starts bleeding, lie flat and put pressure on the site. If the bleeding does not stop, get help right away. This  is a medical emergency.  Drink enough fluid to keep your urine clear or pale yellow. This helps flush the contrast dye from your body.  Take over-the-counter and prescription medicines only as told by your health care provider.  Keep all follow-up visits as told by your health care provider. This is important. Contact a health care provider if:  You have a fever or chills.  You have redness, swelling, or pain around your insertion site.  You have fluid or blood coming from your insertion site.  The insertion site feels warm to the touch.  You have pus or a bad smell coming from your insertion site.  You have bruising around the insertion site.  You notice blood collecting in the tissue around the catheter site (hematoma). The hematoma may be painful to the touch. Get help right away if:  You have severe pain at the catheter insertion area.  The catheter insertion area swells very fast.  The catheter insertion area is bleeding, and the bleeding does not stop when you hold steady pressure on the area.  The area near or just beyond the catheter insertion site becomes pale, cool, tingly, or numb. These symptoms may represent a serious problem that is an emergency. Do not wait to see if the symptoms will go away. Get medical help right away. Call your local emergency services (911 in the [place] Do not drive yourself to the hospital. Summary  After the  procedure, it is common to have bruising and tenderness at the catheter insertion area.  After the procedure, it is important to rest and drink plenty of fluids.  Do not take baths, swim, or use a hot tub until your health care provider says it is okay to do so. You may shower 24-48 hours after the procedure or as told by your health care provider.  If the catheter site starts bleeding, lie flat and put pressure on the site. If the bleeding does not stop, get help right away. This is a medical emergency. This information is not  intended to replace advice given to you by your health care provider. Make sure you discuss any questions you have with your health care provider. Document Released: 11/11/2004 Document Revised: 04/07/2017 Document Reviewed: 03/30/2016 Elsevier Patient Education  2020 [employer].

## 2019-01-02 NOTE — Interval H&P Note (Signed)
History and Physical Interval Note:  01/02/2019 10:54 AM  [name]  has presented today for surgery, with the diagnosis of pad.  The various methods of treatment have been discussed with the patient and family. After consideration of risks, benefits and other options for treatment, the patient has consented to  Procedure(s): ABDOMINAL AORTOGRAM W/LOWER EXTREMITY (N/A) as a surgical intervention.  The patient's history has been reviewed, patient examined, no change in status, stable for surgery.  I have reviewed the patient's chart and labs.  Questions were answered to the patient's satisfaction.     Kathlyn Sacramento

## 2019-01-03 ENCOUNTER — Encounter (HOSPITAL_COMMUNITY): Payer: Self-pay | Admitting: Cardiovascular Disease

## 2019-01-03 ENCOUNTER — Other Ambulatory Visit: Payer: Medicare Other | Admitting: Adult Health Nurse Practitioner

## 2019-01-03 ENCOUNTER — Telehealth: Payer: Self-pay

## 2019-01-03 DIAGNOSIS — Z515 Encounter for palliative care: Secondary | ICD-10-CM

## 2019-01-03 NOTE — Telephone Encounter (Signed)
Received call from Amy NP requesting PCP to be contacted to provide update and request an order for Hospice Eval

## 2019-01-04 ENCOUNTER — Other Ambulatory Visit: Payer: Self-pay

## 2019-01-04 ENCOUNTER — Encounter (HOSPITAL_COMMUNITY): Payer: Self-pay | Admitting: Cardiovascular Disease
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
## 2019-01-07 NOTE — Discharge Summary (Signed)
Name: [name] MRN: [id number] DOB: 1945-11-29 73 y.o. PCP: Loretha Brasil, FNP  Date of Admission: 12/26/2018 10:29 AM Date of Discharge: 12/27/2018 Attending Physician: No att. providers found  Discharge Diagnosis: 1. Symptomatic Anemia  Discharge Medications: Allergies as of 12/27/2018      Reactions   Iohexol Hives, Itching, Other (See Comments)   Patient has itching and hives, needs 13 hour prep      Medication List    TAKE these medications   acetaminophen 325 MG tablet Commonly known as: TYLENOL Take 2 tablets (650 mg total) by mouth every 4 (four) hours as needed for mild pain (or temp > 37.5 C (99.5 F)).   albuterol 108 (90 Base) MCG/ACT inhaler Commonly known as: Ventolin HFA Inhale 2 puffs into the lungs every 6 (six) hours as needed for wheezing or shortness of breath.   amLODipine 10 MG tablet Commonly known as: NORVASC Take 1 tablet (10 mg total) by mouth daily.   carvedilol 25 MG tablet Commonly known as: COREG Take 2 tablets (50 mg total) by mouth 2 (two) times daily with a meal.   cholecalciferol 25 MCG (1000 UT) tablet Commonly known as: VITAMIN D3 Take 1,000 Units by mouth daily.   cloNIDine 0.2 MG tablet Commonly known as: CATAPRES Take 1 tablet (0.2 mg total) by mouth 3 (three) times daily.   Colcrys 0.6 MG tablet Generic drug: colchicine TAKE 1 TABLET (0.6 MG TOTAL) BY MOUTH 2 (TWO) TIMES DAILY. What changed: See the new instructions.   diclofenac sodium 1 % Gel Commonly known as: VOLTAREN Apply 2 g topically 4 (four) times daily as needed. Applies to knee for pain   diphenoxylate-atropine 2.5-0.025 MG tablet Commonly known as: LOMOTIL Take 1 tablet by mouth 4 (four) times daily as needed for diarrhea or loose stools.   furosemide 20 MG tablet Commonly known as: LASIX Take 20 mg by mouth daily as needed (foot, ankle, leg swelling).   gabapentin 300 MG capsule Commonly known as: NEURONTIN 300 mg at bedtime. What changed:  Another medication with the same name was changed. Make sure you understand how and when to take each.   gabapentin 100 MG capsule Commonly known as: Neurontin Take 1 capsule (100 mg total) by mouth daily. What changed: when to take this   HYDROcodone-acetaminophen 5-325 MG tablet Commonly known as: NORCO/VICODIN Take 1 tablet by mouth every 12 (twelve) hours as needed for moderate pain.   levETIRAcetam 500 MG tablet Commonly known as: Keppra Take 1 tablet (500 mg total) by mouth 2 (two) times daily.   megestrol 40 MG tablet Commonly known as: MEGACE Take 40 mg by mouth daily.   nicotine 21 mg/24hr patch Commonly known as: NICODERM CQ - dosed in mg/24 hours Place 1 patch (21 mg total) onto the skin daily. What changed:   when to take this  reasons to take this   ondansetron 4 MG tablet Commonly known as: ZOFRAN Take 4 mg by mouth every 8 (eight) hours as needed for nausea or vomiting.   PROBIOTIC DAILY PO Take by mouth.   simvastatin 40 MG tablet Commonly known as: ZOCOR Take 40 mg by mouth daily.   sodium bicarbonate 650 MG tablet Take 650 mg by mouth daily.   Symbicort 80-4.5 MCG/ACT inhaler Generic drug: budesonide-formoterol Inhale 2 puffs into the lungs 2 (two) times daily. What changed:   when to take this  reasons to take this   Viibryd 40 MG Tabs Generic drug: Vilazodone HCl Take  40 mg by mouth daily.       Disposition and follow-up:   Mr.[name] was discharged from [facility] in Stable condition.  At the hospital follow up visit please address:  1.  Optimize medications  2.  Labs / imaging needed at time of follow-up: N/A  3.  Pending labs/ test needing follow-up: N/A  Follow-up Appointments: N/A  Hospital Course by problem list: 1. Symptomatic Anemia:  [name] is a 73 y/o male, with a history of CML, diverticulosis, and  hemorrhoids that presented to [facility] after having two "seizure like" activities on August  and finding "low blood counts." He was confused and noncontributory during his interview. His hemoglobin was found to be 6.5 and, after receiving 2 units of blood, increased to 9.3. [name] is in palliative care, and was discharged back to their care on August 20th, 2020.   2. Acute on Chronic AKI:   [name] does have CKD stage III/IV with a creatinine of 3.05 that began to trend down after being given fluids.   Discharge Vitals:   BP (!) 151/88 (BP Location: Left Arm)    Pulse 63    Temp 98.1 F (36.7 C) (Oral)    Resp 17    Ht 5\' 6"  (1.676 m)    Wt 49 kg    SpO2 100%    BMI 17.44 kg/m   Pertinent Labs, Studies, and Procedures:  CBC:  Ref Range & Units  (12/25/18)  (11/04/18) 36mo ago (11/04/18) 66mo ago (11/03/18) 81mo ago (11/03/18)   WBC 3.4 - 10.8 x10E3/uL 13.5High   9.3 R  9.0 R   10.6High  R   RBC 4.14 - 5.80 x10E6/uL 2.52Low Panic   3.07Low  R  2.77Low  R   3.33Low  R   Hemoglobin 13.0 - 17.7 g/dL 6.2Low Panic   7.9Low  R  7.2Low  R     CBC:   Ref Range & Units  (12/27/18)  (12/26/18) 13d ago (12/25/18) 44mo ago (11/04/18) 7mo ago (11/04/18)  WBC 4.0 - 10.5 K/uL 12.7High   10.3  13.5High  R  9.3  9.0   RBC 4.22 - 5.81 MIL/uL 3.50Low   2.59Low   2.52Low Panic  R  3.07Low   2.77Low    Hemoglobin 13.0 - 17.0 g/dL 9.3Low   6.5Low Panic  CM  6.2Low Panic  R, CM  7.9Low   7.2Low       Discharge Instructions: Discharge Instructions    Diet - low sodium heart healthy   Complete by: As directed    Discharge instructions   Complete by: As directed    Thank you for allowing [place] to provide your care. Please continue all your medications as prescribed and follow-up with your primary care doctor and palliative care.   Increase activity slowly   Complete by: As directed       Signed: Maudie Mercury, MD 01/07/2019, 7:16 PM   Pager: [phone]

## 2019-01-09 ENCOUNTER — Encounter (HOSPITAL_BASED_OUTPATIENT_CLINIC_OR_DEPARTMENT_OTHER): Payer: Medicare Other | Attending: Physician Assistant

## 2019-01-09 ENCOUNTER — Telehealth: Payer: Self-pay | Admitting: Cardiovascular Disease

## 2019-01-09 NOTE — Telephone Encounter (Signed)
LVM for patient to call back to confirm appt with Dr. Fletcher Anon on 02-05-19 at 10 a.m.

## 2019-01-25 ENCOUNTER — Telehealth: Payer: Self-pay | Admitting: *Deleted

## 2019-01-25 NOTE — Telephone Encounter (Signed)
Dr. [name] requested RN call to see if patient wishes to continue F/U with him? Noted he is followed by [facility] Palliative Team per his PCP. Left VM for his POA/significant other to return call.

## 2019-02-05 ENCOUNTER — Ambulatory Visit: Payer: Medicare Other | Admitting: Cardiovascular Disease

## 2019-02-07 DEATH — deceased
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
## 2020-02-20 IMAGING — CR DG ANKLE COMPLETE 3+V*L*
3 series · 3 of 3 positions shown · non-contrast
Comparison: None.

CLINICAL DATA: Fall, ankle injury.

EXAM:
LEFT ANKLE COMPLETE - 3+ VIEW

[x ankle ap left]
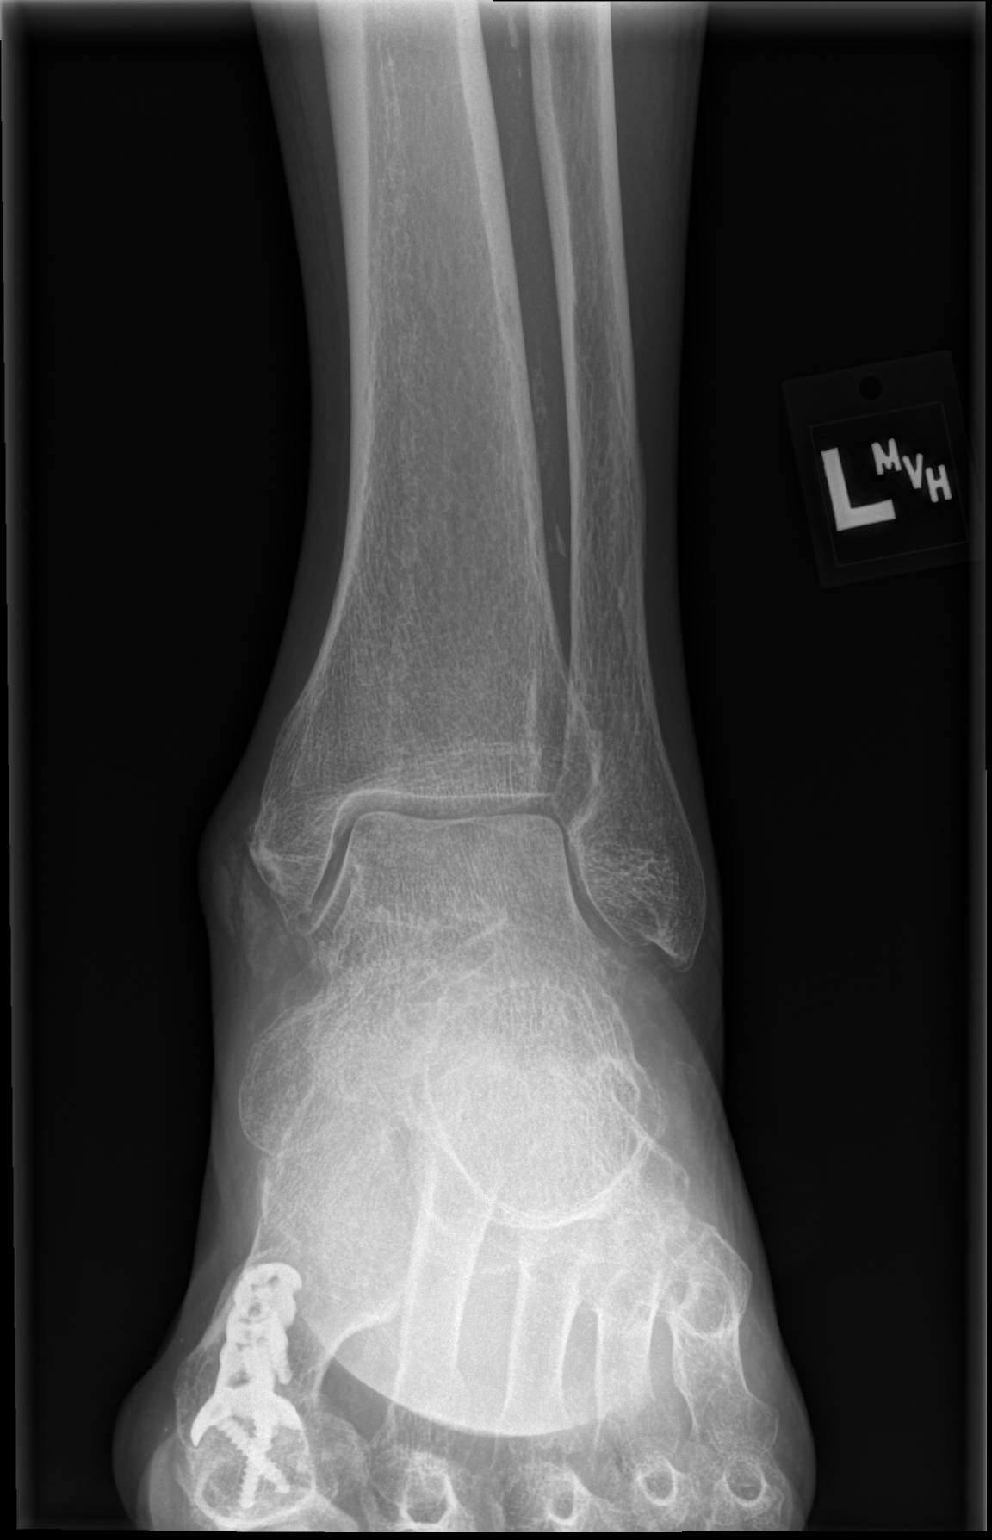

[x ankle obl left]
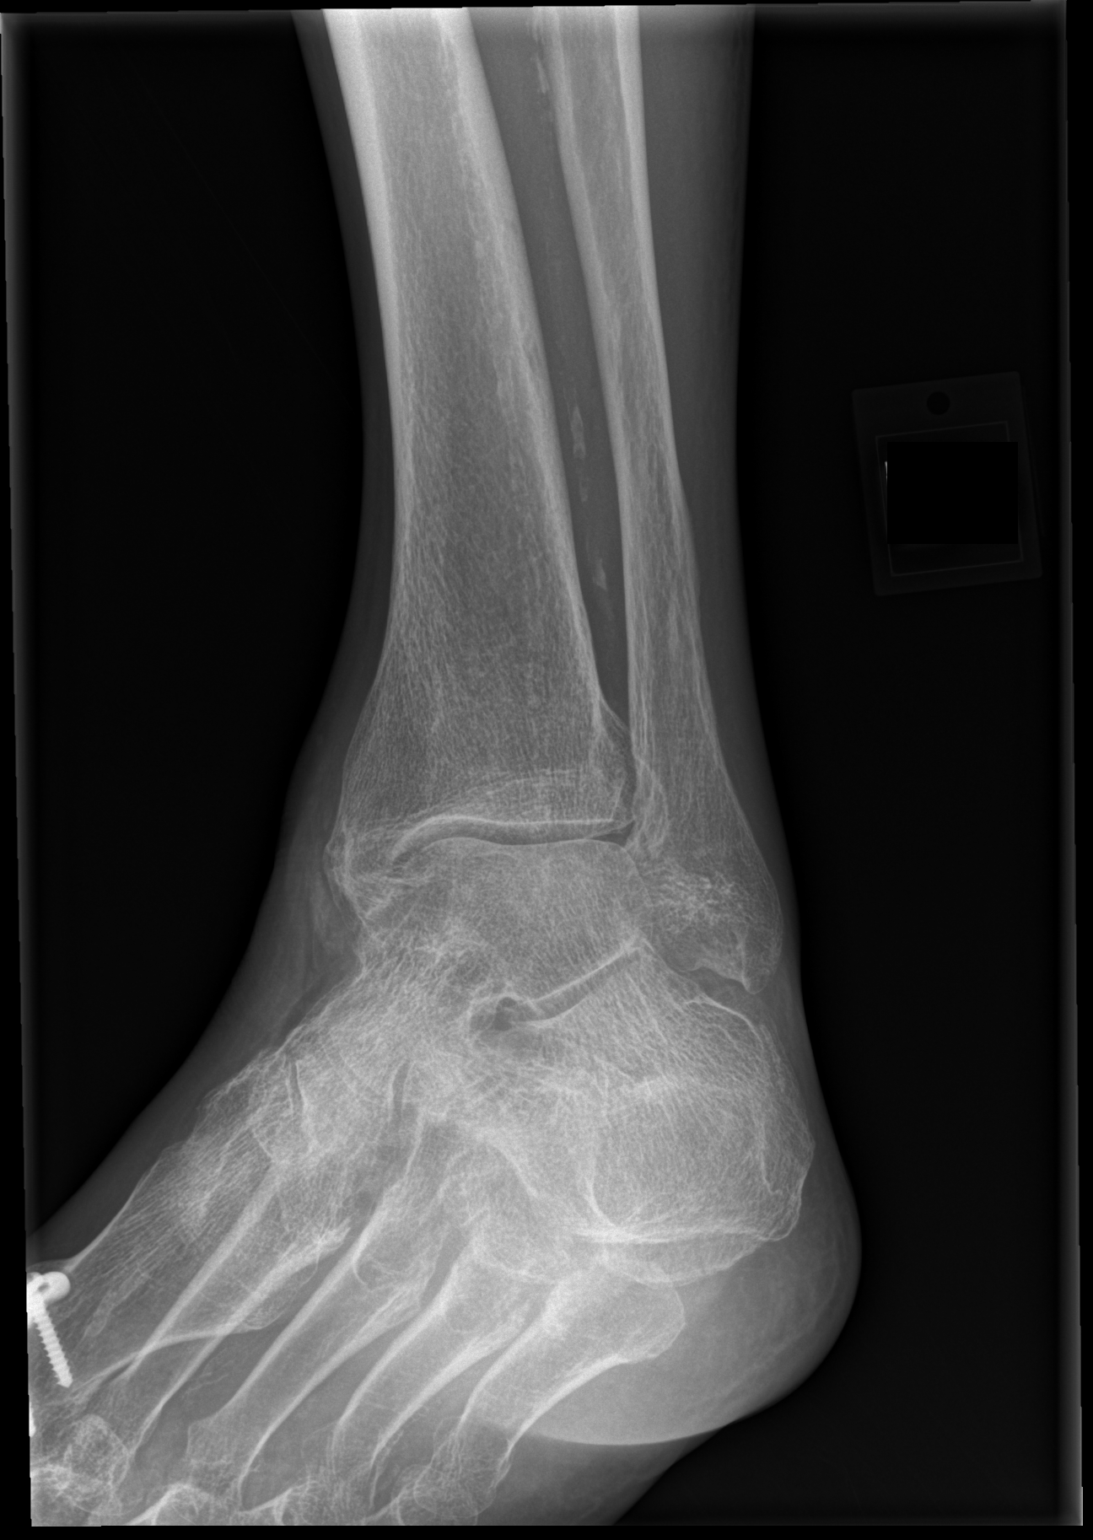

[x ankle lat left]
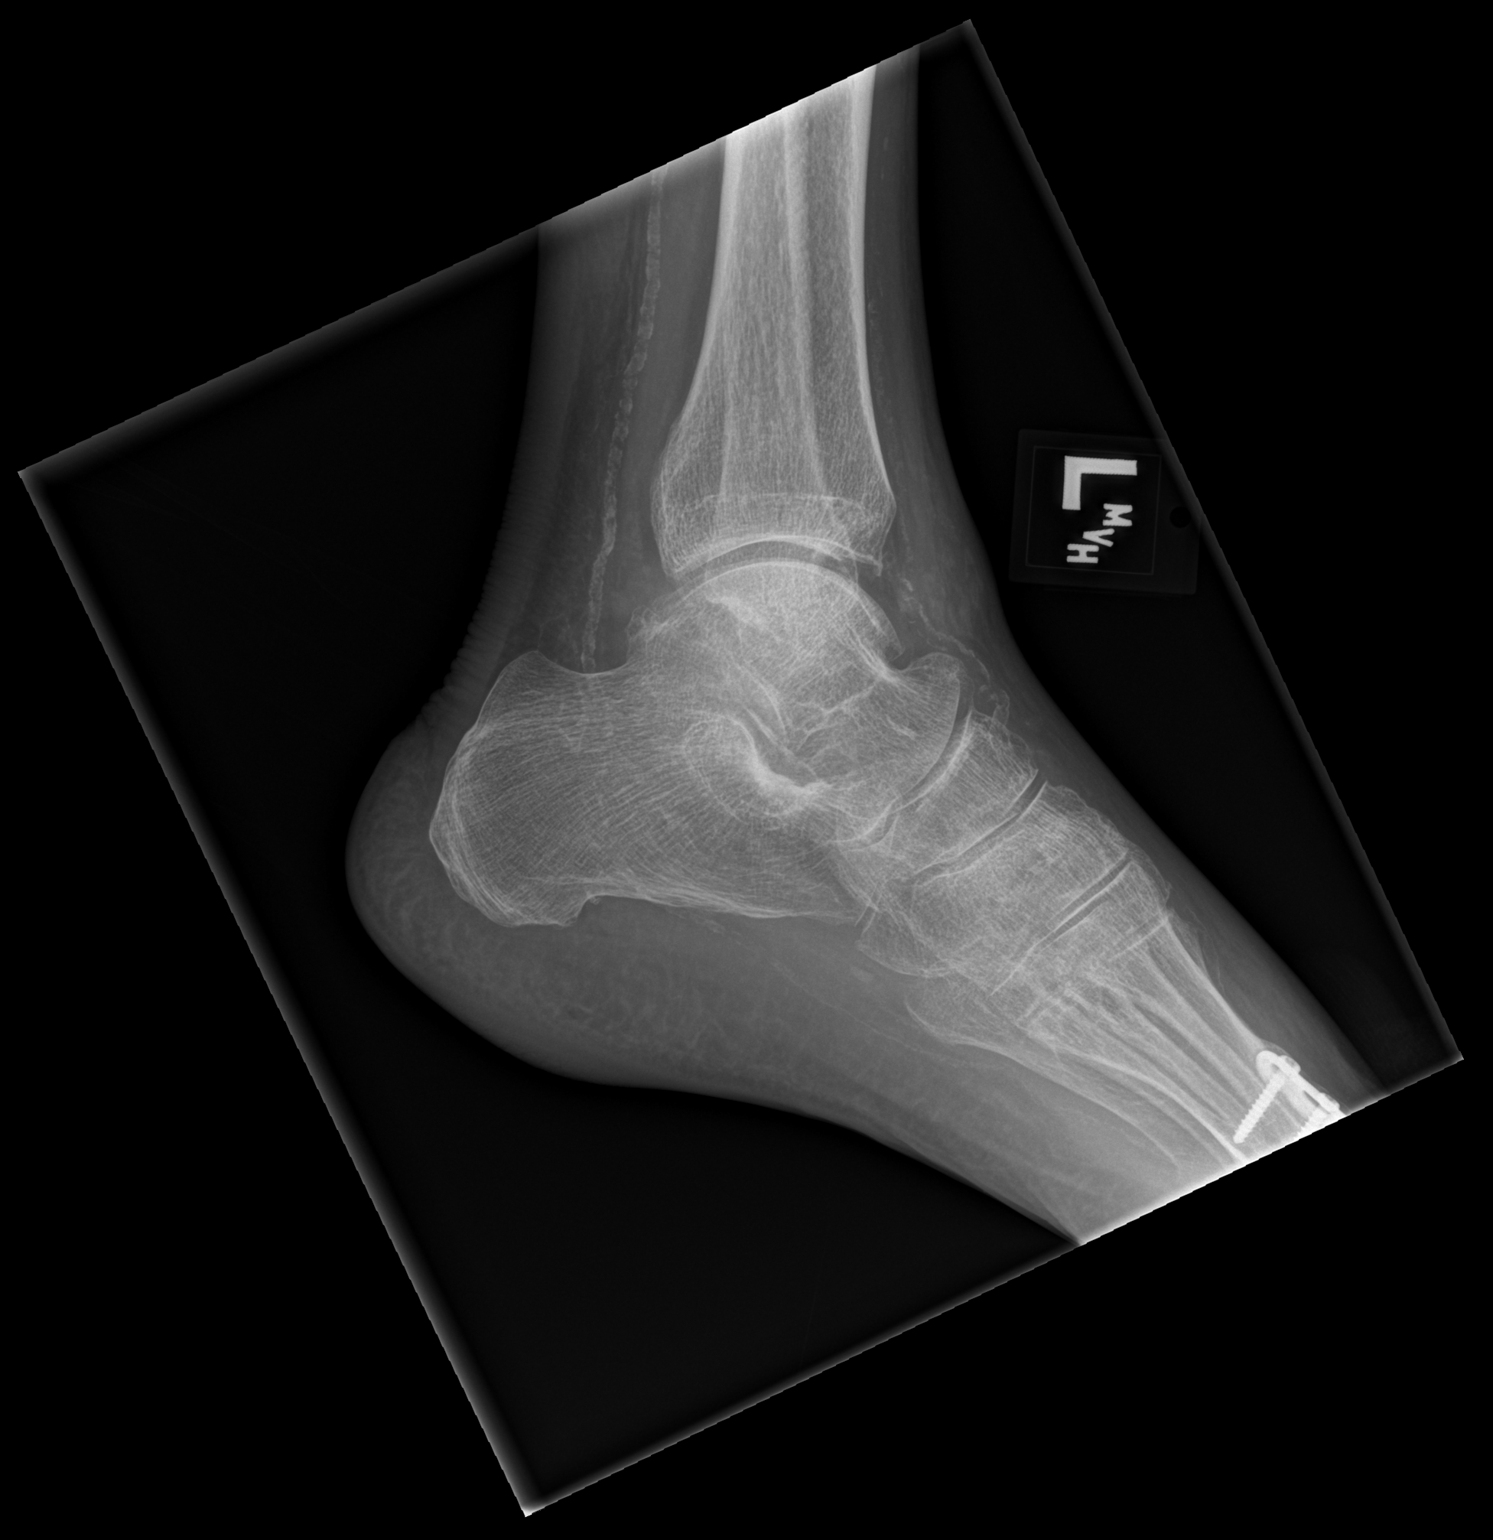

[3 of 3 positions shown; findings below may reference images not displayed]

FINDINGS: No osseous fracture line or displaced fracture fragment seen. Ankle
mortise is symmetric.

Chronic faint calcific densities adjacent to the medial malleolus,
likely sequela of previous injury and/or chronic calcific
tendinopathy. Vascular calcifications within the left calf. No acute
appearing soft tissue abnormality.
IMPRESSION: No acute findings. No osseous fracture or dislocation seen.
Chronic/incidental findings detailed above.

## 2020-05-04 ENCOUNTER — Other Ambulatory Visit: Payer: Self-pay | Admitting: *Deleted

## 2022-12-27 NOTE — Progress Notes (Signed)
EKG done. Stefan Church, RN
